# Patient Record
Sex: Male | Born: 1965
Health system: Southern US, Community
[De-identification: ages and names within clinical notes are randomized; demographics above are authoritative.]

## PROBLEM LIST (undated history)

## (undated) DIAGNOSIS — F1290 Cannabis use, unspecified, uncomplicated: Secondary | ICD-10-CM

## (undated) DIAGNOSIS — G4730 Sleep apnea, unspecified: Secondary | ICD-10-CM

## (undated) DIAGNOSIS — I10 Essential (primary) hypertension: Secondary | ICD-10-CM

## (undated) DIAGNOSIS — E7800 Pure hypercholesterolemia, unspecified: Secondary | ICD-10-CM

## (undated) DIAGNOSIS — I82409 Acute embolism and thrombosis of unspecified deep veins of unspecified lower extremity: Secondary | ICD-10-CM

## (undated) DIAGNOSIS — M419 Scoliosis, unspecified: Secondary | ICD-10-CM

## (undated) DIAGNOSIS — I2699 Other pulmonary embolism without acute cor pulmonale: Secondary | ICD-10-CM

## (undated) DIAGNOSIS — M1990 Unspecified osteoarthritis, unspecified site: Secondary | ICD-10-CM

## (undated) HISTORY — DX: Essential (primary) hypertension: I10

## (undated) HISTORY — PX: HERNIA REPAIR: SHX51

## (undated) HISTORY — DX: Other pulmonary embolism without acute cor pulmonale: I26.99

## (undated) HISTORY — DX: Scoliosis, unspecified: M41.9

## (undated) HISTORY — PX: KNEE SURGERY: SHX244

## (undated) HISTORY — PX: TONSILLECTOMY: SUR1361

## (undated) HISTORY — DX: Cannabis use, unspecified, uncomplicated: F12.90

---

## 2010-09-17 ENCOUNTER — Emergency Department (HOSPITAL_COMMUNITY)
Admission: EM | Admit: 2010-09-17 | Discharge: 2010-09-17 | Disposition: A | Discharge status: Home / Self Care | Payer: Self-pay | Attending: Emergency Medicine | Admitting: Emergency Medicine

## 2010-09-17 ENCOUNTER — Emergency Department (HOSPITAL_COMMUNITY): Payer: Self-pay

## 2010-09-17 DIAGNOSIS — M25469 Effusion, unspecified knee: Secondary | ICD-10-CM | POA: Insufficient documentation

## 2010-09-17 DIAGNOSIS — Z9889 Other specified postprocedural states: Secondary | ICD-10-CM | POA: Insufficient documentation

## 2010-09-17 DIAGNOSIS — M25569 Pain in unspecified knee: Secondary | ICD-10-CM | POA: Insufficient documentation

## 2010-09-17 DIAGNOSIS — I10 Essential (primary) hypertension: Secondary | ICD-10-CM | POA: Insufficient documentation

## 2013-05-04 ENCOUNTER — Encounter: Payer: Self-pay | Admitting: Family Medicine

## 2013-05-04 ENCOUNTER — Ambulatory Visit (INDEPENDENT_AMBULATORY_CARE_PROVIDER_SITE_OTHER): Payer: Commercial Managed Care - PPO | Admitting: Family Medicine

## 2013-05-04 VITALS — BP 153/92 | HR 69 | Temp 98.3°F | Ht 75.0 in | Wt 267.8 lb

## 2013-05-04 DIAGNOSIS — M25569 Pain in unspecified knee: Secondary | ICD-10-CM

## 2013-05-04 DIAGNOSIS — M545 Low back pain, unspecified: Secondary | ICD-10-CM

## 2013-05-04 DIAGNOSIS — Z6833 Body mass index (BMI) 33.0-33.9, adult: Secondary | ICD-10-CM

## 2013-05-04 DIAGNOSIS — Z0000 Encounter for general adult medical examination without abnormal findings: Secondary | ICD-10-CM

## 2013-05-04 DIAGNOSIS — F1910 Other psychoactive substance abuse, uncomplicated: Secondary | ICD-10-CM

## 2013-05-04 DIAGNOSIS — I10 Essential (primary) hypertension: Secondary | ICD-10-CM

## 2013-05-04 DIAGNOSIS — M25562 Pain in left knee: Secondary | ICD-10-CM

## 2013-05-04 DIAGNOSIS — Z23 Encounter for immunization: Secondary | ICD-10-CM

## 2013-05-04 DIAGNOSIS — F1911 Other psychoactive substance abuse, in remission: Secondary | ICD-10-CM | POA: Insufficient documentation

## 2013-05-04 DIAGNOSIS — M25561 Pain in right knee: Secondary | ICD-10-CM

## 2013-05-04 DIAGNOSIS — Z7689 Persons encountering health services in other specified circumstances: Secondary | ICD-10-CM

## 2013-05-04 DIAGNOSIS — Z7189 Other specified counseling: Secondary | ICD-10-CM

## 2013-05-04 LAB — LIPID PANEL
CHOL/HDL RATIO: 3.5 ratio
CHOLESTEROL: 162 mg/dL (ref 0–200)
HDL: 46 mg/dL (ref >39)
LDL Cholesterol: 88 mg/dL (ref 0–99)
Triglycerides: 142 mg/dL (ref <150)
VLDL: 28 mg/dL (ref 0–40)

## 2013-05-04 LAB — COMPREHENSIVE METABOLIC PANEL
ALBUMIN: 4.3 g/dL (ref 3.5–5.2)
ALT: 14 U/L (ref 0–53)
AST: 19 U/L (ref 0–37)
Alkaline Phosphatase: 65 U/L (ref 39–117)
BUN: 18 mg/dL (ref 6–23)
CALCIUM: 9.1 mg/dL (ref 8.4–10.5)
CHLORIDE: 107 meq/L (ref 96–112)
CO2: 26 meq/L (ref 19–32)
Creat: 1.26 mg/dL (ref 0.50–1.35)
GLUCOSE: 92 mg/dL (ref 70–99)
POTASSIUM: 4.7 meq/L (ref 3.5–5.3)
Sodium: 136 mEq/L (ref 135–145)
Total Bilirubin: 0.3 mg/dL (ref 0.2–1.2)
Total Protein: 6.9 g/dL (ref 6.0–8.3)

## 2013-05-04 LAB — TSH: TSH: 0.598 u[IU]/mL (ref 0.350–4.500)

## 2013-05-04 LAB — CBC
HCT: 45.1 % (ref 39.0–52.0)
HEMOGLOBIN: 15.1 g/dL (ref 13.0–17.0)
MCH: 29.7 pg (ref 26.0–34.0)
MCHC: 33.5 g/dL (ref 30.0–36.0)
MCV: 88.6 fL (ref 78.0–100.0)
PLATELETS: 197 10*3/uL (ref 150–400)
RBC: 5.09 MIL/uL (ref 4.22–5.81)
RDW: 15.4 % (ref 11.5–15.5)
WBC: 6.9 10*3/uL (ref 4.0–10.5)

## 2013-05-04 MED ORDER — AMLODIPINE BESYLATE 5 MG PO TABS: 5.0000 mg | ORAL_TABLET | Freq: Every day | ORAL | Status: DC

## 2013-05-04 NOTE — Assessment & Plan Note (Signed)
Long-standing, likely related to degenerative process +/- component of obesity and chronic kinetic chain disruption over many years with chronic knee pain / likely osteoarthritis. Continue OTC medications for now. Provided pt with phone number for sports medicine clinic. Will continue to counsel on diet / exercise to help with weight component, and will likely consider formal physical therapy. Will readdress at follow-up and / or will review [facility] notes and recommendations if / when pt is seen there.

## 2013-05-04 NOTE — Progress Notes (Signed)
Subjective:    [name], male    DOB: [birth year], 48 y.o.   MRN: [id number]  HPI: Pt presents to establish care. He also requests to discuss knee and back pain. He has not been seen in 4-5 years; his previous doctor was in [place].  Acute issues: Knee / back pain - long-standing, but acutely worse in the past six months with steady progression - both knees and low back have soreness and occasionally his knees swell - OTC medications such as Motrin and Tylenol help temporarily - knee braces and ACE wraps help some with stability - he has had bilateral knee surgeries (for ligamentous injuries) in the past; these were both done in [place] - he denies rash, fever / chills, change in bowel / bladder habits, and has no FH of rheumatoid arthritis  Chronic issues: HTN - diagnosed several years ago, ?2003 - was on amlodipine in the in the past, but has been able to afford it, he thinks 5 mg - denies LE swelling, headache, chest pain / palpitations, or change in vision  Past Medical History  Diagnosis Date  . Marijuana use   . Hypertension    Past Surgical History  Procedure Laterality Date  . Knee surgery Bilateral     Left knee 1993, right knee 2004  . Hernia repair      2005 and 2011   Family History  Problem Relation Age of Onset  . Alcohol abuse Mother   . Heart disease Mother   . Depression Mother   . Hypertension Mother   . Kidney disease Mother   . Arthritis Mother   . Arthritis Father   . Alcohol abuse Brother   . Drug abuse Brother   . Sickle cell anemia Brother   . Heart disease Other   . Arthritis Other   . Heart disease Other   . Arthritis Other    History   Social History  . Marital Status: Married    Spouse Name: N/A    Number of Children: N/A  . Years of Education: N/A   Occupational History  . Not on file.   Social History Main Topics  . Smoking status: Current Every Day Smoker  . Smokeless tobacco: Not on file  . Alcohol Use: Not  on file  . Drug Use: Not on file  . Sexual Activity: Not on file   Other Topics Concern  . Not on file   Social History Narrative  . No narrative on file   Pt is a current smoker, 1/4-1/5 pack per day; he is not interested in quitting just yet. He works as in Scientist, water qualitydoes a lot of walking." In addition to the above documentation, pt's PMH, surgical history, FH, and SH all reviewed and updated where appropriate in the EMR. I have also reviewed and updated the pt's allergies and current medications as appropriate.  Review of Systems: As above. Otherwise, full 12-system ROS was reviewed and all negative.     Objective:   Physical Exam BP 153/92  Pulse 69  Temp(Src) 98.3 F (36.8 C) (Oral)  Ht 6\' 3"  (1.905 m)  Wt 267 lb 12.8 oz (121.473 kg)  BMI 33.47 kg/m2 Gen: well-appearing adult male in NAD HEENT: [place]/AT, sclerae/conjunctivae clear, no lid lag, EOMI, PERRLA   MMM, posterior oropharynx clear, no cervical lymphadenopathy  neck supple with full ROM, no masses appreciated; thyroid not enlarged  Cardio: RRR, no murmur appreciated; distal pulses intact/symmetric Pulm: CTAB, no wheezes,  normal WOB  Abd: soft, nondistended, BS+, no HSM Ext: warm/well-perfused, no cyanosis/clubbing/edema MSK: strength 5/5 in all four extremities  Small bilateral knee effusions, R > L, but no frank warmth or redness  Tenderness to palpation along lateral joint lines bilaterally in knees  Increased pain in bilateral knees with varus stress  Bilateral knees with marked crepitus over patella with active extension  normal ROM to all four extremities  Point tenderness in lumbar spine around L4-S1  Mild muscle spasm bilaterally to paraspinal muscles in low thoracic / upper lumbar region Neuro/Psych: alert/oriented, sensation grossly intact; normal gait/balance  mood euthymic with congruent affect     Assessment & Plan:  See problem list notes.  MDM Justification for New Patient Level IV visit  (24462):  Problem points: 5 (established problem, worsening x2 -- bilateral knee pain, lumbar back pain, established problem, stable -- HTN)  Risk: Moderate (Rx drug management)

## 2013-05-04 NOTE — Assessment & Plan Note (Signed)
Likely degenerative in nature with history of bilateral knee surgeries for ligamentous injuries; pt is also obese and works on his feet (in Architect). Less concern for RA or gout given history / exam, though will need to consider these diagnosis pending further work-up and/or progression of illness. Continue OTC pain medications for now. Gave pt sports medicine clinic phone number, as well. Will continue to counsel on diet / exercise to help with weight component, and will likely consider formal physical therapy. Will readdress at follow-up and / or will review [facility] notes and recommendations if / when pt is seen there.

## 2013-05-04 NOTE — Assessment & Plan Note (Addendum)
Diagnosed first several years ago, but has been off of amlodipine for several years due to inability to pay for medication. Now has insurance and would like to restart this. BP today 153/92. Rx given for amlodipine 5 mg daily. Will check CMP, today. Will monitor at follow-up and adjust / add new agents as needed.

## 2013-05-04 NOTE — Patient Instructions (Signed)
Thank you for coming in, today!  We will check some basic lab work. I will call you or send you a letter with the results. I want you to start taking amlodipine again.  We will check your blood pressure regularly. If we need, we will adjust medicines for that.  For your back and knee pain, you can continue to take over-the-counter meds. You can call [facility] at [phone].  For your smoking, you can call a free phone number, 1-800-QUIT-NOW. They can help with counseling strategies and they will set up times to call you that's convenient for you. This is absolutely free. If they give you any ideas on things to try that you need my help with, let me know.  Please feel free to call with any questions or concerns at any time, at [phone]. --Dr. Venetia Maxon

## 2013-05-04 NOTE — Assessment & Plan Note (Signed)
Will check basic / baseline labs, today. See separate problem list notes. Pt will be due for colonoscopy after 48th birthday in July of this year. Flu shot given today.

## 2013-05-04 NOTE — Assessment & Plan Note (Signed)
Obese, though pt reportedly has lost almost 100 pounds (intentionally) in the last few years. Interested in therapeutic lifestyle changes. Briefly counseled on weight loss. Will check lipid panel and TSH today, then follow up as needed. Will consider referral to nutrition therapy based on pt preference.

## 2013-05-05 ENCOUNTER — Encounter: Payer: Self-pay | Admitting: Family Medicine

## 2013-05-05 ENCOUNTER — Telehealth: Payer: Self-pay | Admitting: Family Medicine

## 2013-05-05 DIAGNOSIS — Z9189 Other specified personal risk factors, not elsewhere classified: Secondary | ICD-10-CM | POA: Insufficient documentation

## 2013-05-05 NOTE — Telephone Encounter (Signed)
Pharmacy edit

## 2013-05-05 NOTE — Telephone Encounter (Signed)
Pt called to let the team know he is going to use [facility] as his main pharmacy. Also he would like all generics on his medications to include his BP medications. jw

## 2013-05-17 ENCOUNTER — Encounter: Payer: Self-pay | Admitting: Emergency Medicine

## 2013-05-17 ENCOUNTER — Ambulatory Visit
Admission: RE | Admit: 2013-05-17 | Discharge: 2013-05-17 | Disposition: A | Discharge status: Home / Self Care | Payer: Commercial Managed Care - PPO | Source: Ambulatory Visit | Attending: Sports Medicine | Admitting: Sports Medicine

## 2013-05-17 ENCOUNTER — Ambulatory Visit
Admission: RE | Admit: 2013-05-17 | Discharge: 2013-05-17 | Disposition: A | Discharge status: Home / Self Care | Payer: 59 | Source: Ambulatory Visit | Attending: Sports Medicine | Admitting: Sports Medicine

## 2013-05-17 ENCOUNTER — Ambulatory Visit (INDEPENDENT_AMBULATORY_CARE_PROVIDER_SITE_OTHER): Payer: Commercial Managed Care - PPO | Admitting: Emergency Medicine

## 2013-05-17 VITALS — BP 148/108 | Ht 75.0 in | Wt 267.0 lb

## 2013-05-17 DIAGNOSIS — M545 Low back pain, unspecified: Secondary | ICD-10-CM

## 2013-05-17 DIAGNOSIS — M25561 Pain in right knee: Secondary | ICD-10-CM

## 2013-05-17 DIAGNOSIS — M25562 Pain in left knee: Principal | ICD-10-CM

## 2013-05-17 DIAGNOSIS — M25569 Pain in unspecified knee: Secondary | ICD-10-CM

## 2013-05-17 MED ORDER — MELOXICAM 15 MG PO TABS: 15.0000 mg | ORAL_TABLET | Freq: Every day | ORAL | Status: DC

## 2013-05-17 NOTE — Progress Notes (Signed)
   Subjective:    [name], male    DOB: 05-10-1965, 48 y.o.   MRN: [id number]  HPI chief complaint: Bilateral knee and low back pain  Very pleasant 48 year old male comes in today with a couple of different complaints. He is complaining of bilateral knee pain which has been present now for several years. He is status post bilateral knee arthroscopy, first one done in 1997 and the second one done in 2004. He describes diffuse aching and intermittent swelling in both knees. He has a feeling of the knees wanting to give way. He gets frequent catching and popping particularly in the left knee. He has tried over-the-counter anti-inflammatories with minimal symptom relief. No recent trauma. He uses a compression wrap on the left knee which helps somewhat. In regards to his low back pain he describes a diffuse ache across the lower lumbar spine. Worse with activity. No radiculopathy.  Past medical history and current medications are reviewed Allergies are reviewed    Review of Systems     Objective:   Physical Exam Well-developed, well-nourished. No acute distress. Awake alert and oriented x3. Vital signs are reviewed.  Right knee: Full range of motion. No effusion. There is tenderness to palpation over the medial joint line with pain but no popping with McMurray's. No tenderness over the lateral joint line. Knee is grossly stable to ligamentous exam. No popliteal cyst. Neurovascularly intact distally.  Left knee: Full range of motion. Trace effusion. Tender to palpation over the medial joint line with pain but no popping with McMurray's. No tenderness over the lateral joint line. Knee is grossly stable to ligamentous exam. No popliteal cyst. Neurovascularly intact distally. Slight varus deformity.  Lumbar spine: Limited mobility secondary to pain. Diffuse tenderness to palpation along the lumbosacral area but nothing focal. No spasm. No gross focal neurological deficits of either  lower extremity.  X-rays of the left knee dated June of 2012 are reviewed. They are non-standing films. He has a mild degenerative changes with multiple intra-articular loose bodies. Nothing acute.       Assessment & Plan:  Chronic bilateral knee pain likely secondary to DJD with x-ray evidence of multiple intra-articular loose bodies in the left knee Chronic low back pain  I want to start with getting updated x-rays of his knees and an x-ray of his lumbar spine. Patient will likely need referral for arthroscopy to have the loose bodies removed from his left knee. We will try Mobic 15 mg daily as needed. He will use this in lieu of of over-the-counter naproxen sodium or Advil. I will call him after reviewing his x-rays at which point we will delineate further treatment.

## 2013-05-19 ENCOUNTER — Telehealth: Payer: Self-pay | Admitting: Sports Medicine

## 2013-05-19 ENCOUNTER — Telehealth: Payer: Self-pay | Admitting: *Deleted

## 2013-05-19 NOTE — Telephone Encounter (Signed)
I spoke with the patient on the phone today after reviewing x-rays of both knees and his lumbar spine. Left knee shows bone-on-bone medial compartmental DJD with multiple loose bodies. Right knee shows approaching bone-on-bone medial compartmental DJD with large calcific bodies within a Baker's cyst. Lumbar spine films show mild degenerative changes.  Patient has mechanical symptoms significant enough that I think it warrants consultation with Dr. Percell Miller to discussed possible arthroscopy to clean them out. However, he understands that definitive treatment down the road would be a total knee arthroplasty. He would like to discuss his surgical options with Dr. Percell Miller. Therefore I will arrange for consultation to take place sometime in the next couple of weeks. Further treatment in regards to his knees will be per the discretion of Dr. Percell Miller.

## 2013-05-19 NOTE — Telephone Encounter (Signed)
Per Dr. Micheline Chapman- scheduled pt for appt with Dr. Fredonia Highland on 05/24/13 at 9:30 am.  Pt notified of appt info.

## 2013-11-28 ENCOUNTER — Encounter (HOSPITAL_COMMUNITY): Payer: Self-pay | Admitting: Family Medicine

## 2013-11-28 ENCOUNTER — Observation Stay (HOSPITAL_COMMUNITY)
Admission: AD | Admit: 2013-11-28 | Discharge: 2013-11-29 | Disposition: A | Discharge status: Home / Self Care | Payer: 59 | Source: Ambulatory Visit | Attending: Family Medicine | Admitting: Family Medicine

## 2013-11-28 ENCOUNTER — Ambulatory Visit (INDEPENDENT_AMBULATORY_CARE_PROVIDER_SITE_OTHER): Payer: Commercial Managed Care - PPO | Admitting: Family Medicine

## 2013-11-28 ENCOUNTER — Ambulatory Visit (HOSPITAL_COMMUNITY)
Admission: RE | Admit: 2013-11-28 | Discharge: 2013-11-28 | Disposition: A | Discharge status: Home / Self Care | Payer: 59 | Source: Ambulatory Visit | Attending: Family Medicine | Admitting: Family Medicine

## 2013-11-28 VITALS — BP 173/105 | HR 69 | Temp 98.3°F | Wt 264.0 lb

## 2013-11-28 DIAGNOSIS — I82409 Acute embolism and thrombosis of unspecified deep veins of unspecified lower extremity: Principal | ICD-10-CM | POA: Diagnosis present

## 2013-11-28 DIAGNOSIS — M7989 Other specified soft tissue disorders: Secondary | ICD-10-CM | POA: Insufficient documentation

## 2013-11-28 DIAGNOSIS — I10 Essential (primary) hypertension: Secondary | ICD-10-CM | POA: Insufficient documentation

## 2013-11-28 DIAGNOSIS — F1911 Other psychoactive substance abuse, in remission: Secondary | ICD-10-CM

## 2013-11-28 DIAGNOSIS — I82401 Acute embolism and thrombosis of unspecified deep veins of right lower extremity: Secondary | ICD-10-CM

## 2013-11-28 DIAGNOSIS — M79609 Pain in unspecified limb: Secondary | ICD-10-CM

## 2013-11-28 DIAGNOSIS — F17200 Nicotine dependence, unspecified, uncomplicated: Secondary | ICD-10-CM | POA: Insufficient documentation

## 2013-11-28 DIAGNOSIS — Z6833 Body mass index (BMI) 33.0-33.9, adult: Secondary | ICD-10-CM | POA: Diagnosis not present

## 2013-11-28 DIAGNOSIS — Z87898 Personal history of other specified conditions: Secondary | ICD-10-CM

## 2013-11-28 HISTORY — DX: Acute embolism and thrombosis of unspecified deep veins of unspecified lower extremity: I82.409

## 2013-11-28 LAB — BASIC METABOLIC PANEL
Anion gap: 11 (ref 5–15)
BUN: 13 mg/dL (ref 6–23)
CALCIUM: 8.8 mg/dL (ref 8.4–10.5)
CO2: 26 meq/L (ref 19–32)
Chloride: 100 mEq/L (ref 96–112)
Creatinine, Ser: 0.97 mg/dL (ref 0.50–1.35)
GFR calc Af Amer: >90 mL/min (ref >90)
GLUCOSE: 91 mg/dL (ref 70–99)
Potassium: 4.3 mEq/L (ref 3.7–5.3)
Sodium: 137 mEq/L (ref 137–147)

## 2013-11-28 LAB — CBC
HEMATOCRIT: 42 % (ref 39.0–52.0)
Hemoglobin: 14.1 g/dL (ref 13.0–17.0)
MCH: 29.3 pg (ref 26.0–34.0)
MCHC: 33.6 g/dL (ref 30.0–36.0)
MCV: 87.1 fL (ref 78.0–100.0)
PLATELETS: 132 10*3/uL — AB (ref 150–400)
RBC: 4.82 MIL/uL (ref 4.22–5.81)
RDW: 13.6 % (ref 11.5–15.5)
WBC: 7.4 10*3/uL (ref 4.0–10.5)

## 2013-11-28 MED ORDER — AMLODIPINE BESYLATE 5 MG PO TABS
5.0000 mg | ORAL_TABLET | Freq: Every day | ORAL | Status: DC
  Administered 2013-11-28 – 2013-11-29 (×2): 5 mg via ORAL
  Filled 2013-11-28 (×2): qty 1

## 2013-11-28 MED ORDER — ACETAMINOPHEN 650 MG RE SUPP: 650.0000 mg | Freq: Four times a day (QID) | RECTAL | Status: DC | PRN

## 2013-11-28 MED ORDER — TRAMADOL HCL 50 MG PO TABS
50.0000 mg | ORAL_TABLET | Freq: Four times a day (QID) | ORAL | Status: DC
  Administered 2013-11-28 – 2013-11-29 (×5): 50 mg via ORAL
  Filled 2013-11-28 (×5): qty 1

## 2013-11-28 MED ORDER — HYDROCODONE-ACETAMINOPHEN 5-325 MG PO TABS: 1.0000 | ORAL_TABLET | ORAL | Status: DC | PRN

## 2013-11-28 MED ORDER — RIVAROXABAN 15 MG PO TABS
15.0000 mg | ORAL_TABLET | Freq: Two times a day (BID) | ORAL | Status: DC
  Administered 2013-11-28 – 2013-11-29 (×3): 15 mg via ORAL
  Filled 2013-11-28 (×4): qty 1

## 2013-11-28 MED ORDER — SENNOSIDES-DOCUSATE SODIUM 8.6-50 MG PO TABS: 1.0000 | ORAL_TABLET | Freq: Every evening | ORAL | Status: DC | PRN

## 2013-11-28 MED ORDER — ACETAMINOPHEN 325 MG PO TABS: 650.0000 mg | ORAL_TABLET | Freq: Four times a day (QID) | ORAL | Status: DC | PRN

## 2013-11-28 NOTE — H&P (Signed)
[facility] Admission History and Physical Service Pager: [phone]  [name] Medical record number: [id number] Date of birth: 04-Aug-1965 Age: 48 y.o. Gender: male  Primary Care Provider: Christa See, MD Consultants: none Code Status: Full   Chief Complaint: Right swollen leg, DVT   Assessment and Plan: Jeffrey [name] is a 48 y.o. male presenting with RLE DVT. PMH is significant for HTN.   #DVT: Right leg DVT found with LE doppler.  Appears to be unprovoked. Patient with no known travel history, no injury, no changes in bowel movement but denies colonoscopy. Has 18.5 pack year history but no SOB or hemoptysis. Intentional weight loss of 50 lbs over 1.5 years.  Has a history of cocaine use but denies any recent use.  No recent surgery.  - admitted to [facility], Dr. Ree Kida attending  - Xarelto 15 mg BID for anticoagulation (discussed with pharmacy)  - CBC - BMP  - tylenol for mild pain; tramadol for moderate to severe pain  - UDS: pending   #HTN: Uncontrolled. Reports not taking his medication today. May be elevated due to being in pain.  - continue amlodipine. If continued elevation consider titration up.   #Hx of substance abuse: reports no use of cocaine since 2004.  - UDS pending   FEN/GI: Heart healthy diet/saline diet.  Prophylaxis: on full anticoagulation   Disposition: admitted to med-surg pending anticoagulation.  History of Present Illness: Jeffrey [name] is a 48 y.o. male presenting with right swollen leg. This started 3-4 weeks ago when he noticed that his ankle and calf were becoming more swollen and painful. He thought it was related to his prior knee pain and took some mobic. His swelling and pain increased until last Friday it was at its worse. His swelling spread proximally to his right thigh and it had become more painful. Activity was making it worse and rest or elevation help alleviate his symptoms.   He was  seen in clinic today and sent for lower extremity venous duplex with a positive test. He was directly admitted from clinic.   He denies any prior clot. No history of travel. He has a 18.5 pack year history. He has never had a colonoscopy but denies any changes on bowel movements. He denies any injury to his right leg. He denies any chest pain, shortness of breath, hemoptysis, fever, nausea, or chills. He does endorse night sweats. He thinks his mother and maternal grandmother have had clots before.  He's lost 50 lbs in 1.5 years but this has been intentional with diet and exercise.   Review Of Systems: Per HPI with the following additions:  Otherwise 12 point review of systems was performed and was unremarkable.  Patient Active Problem List   Diagnosis Date Noted  . Candidate for statin therapy due to risk of future cardiovascular event 05/05/2013  . HTN (hypertension) 05/04/2013  . Bilateral knee pain 05/04/2013  . Lumbar back pain 05/04/2013  . Healthcare maintenance 05/04/2013  . BMI 33.0-33.9,adult 05/04/2013  . Hx of substance abuse 05/04/2013   Past Medical History: Past Medical History  Diagnosis Date  . Marijuana use   . Hypertension   . DVT (deep venous thrombosis) 11/28/2013    RT LEG   Past Surgical History: Past Surgical History  Procedure Laterality Date  . Knee surgery Bilateral     Left knee 1993, right knee 2004  . Hernia repair      2005 and 2011   Social History: History  Substance Use Topics  . Smoking status: Current Every Day Smoker -- 0.50 packs/day    Types: Cigarettes  . Smokeless tobacco: Never Used  . Alcohol Use: Yes     Comment: 1-2 drinks per event, few times per week   Additional social history: Previous cocaine use but not endorsing any use.  Please also refer to relevant sections of EMR.  Family History: Family History  Problem Relation Age of Onset  . Alcohol abuse Mother   . Heart disease Mother   . Depression Mother   . Hypertension  Mother   . Kidney disease Mother   . Arthritis Mother   . Arthritis Father   . Alcohol abuse Brother   . Drug abuse Brother   . Sickle cell anemia Brother   . Heart disease Other   . Arthritis Other   . Heart disease Other   . Arthritis Other    Allergies and Medications: No Known Allergies No current facility-administered medications on file prior to encounter.   Current Outpatient Prescriptions on File Prior to Encounter  Medication Sig Dispense Refill  . amLODipine (NORVASC) 5 MG tablet Take 1 tablet (5 mg total) by mouth daily.  90 tablet  1  . meloxicam (MOBIC) 15 MG tablet Take 1 tablet (15 mg total) by mouth daily.  40 tablet  1    Objective: BP 161/103  Pulse 76  Temp(Src) 98.6 F (37 C) (Oral)  Resp 20  SpO2 98% Exam: General: NAD, well appearing, alert  HEENT: EOMI, [place]/AT, normal conjunctiva  Cardiovascular: S1S2, no mrg, RRR  Respiratory: CTAB, slight exp wheeze, no extra effort of breathing  Abdomen: soft, NTND, NO HSM, no rebound or guarding  Extremities: right left is significantly larger than left, TTP to proximal thigh, right leg is warm and pulses are +1, limited flexion due to pain, full extension, FROM of ankle, left leg: +2 pulses  Skin: no overlying erythema Neuro: no gross deficits.   Labs and Imaging: CBC BMET  No results found for this basename: WBC, HGB, HCT, PLT,  in the last 168 hours No results found for this basename: NA, K, CL, CO2, BUN, CREATININE, GLUCOSE, CALCIUM,  in the last 168 hours   Bilateral lower extremity venous duplex completed.  Preliminary report: Right - Positive for an extensive occlusive deep vein thrombosis of the posterior tibial, popliteal, profunda, femoral, and common femoral vein. There is also an extensive superficial thrombus of the greater saphenous vein from the mid calf to the confluence with the common femoral vein. The left leg was evaluated per protocol from a patient with DVT of the requested extremity. There  Is no evidence of deep vein thrombosis, superficial throbosis   Rosemarie Ax, MD 11/28/2013, 3:26 PM PGY-2, [facility] pager: [phone], text pages welcome

## 2013-11-28 NOTE — H&P (Signed)
FMTS Attending Note  I personally saw and evaluated the patient. The plan of care was discussed with the resident team. I agree with the assessment and plan as documented by the resident.   48 year old male with past medical history of hypertension and substance abuse admitted with right lower extremity DVT, patient has been having right lower extremity pain and swelling over the past 4-5 days, no associated shortness of breath pain, seen in office today and sent for lower extremity Dopplers, found to have right lower extremity DVT, no recent surgery, no recent travel, no personal history of blood clot, patient's mother did have a blood clot however patient is unsure of the cause, please refer to resident note for additional history of present illness  Vitals: Reviewed General: Pleasant African American male, no acute distress HEENT: Normocephalic, pupils equal in size bilaterally, extraocular movements are intact, moist mucous members Cardiac: Regular rate and rhythm, S1 and S2 present, no murmurs, no heaves or thrills Respiratory: Good auscultation bilaterally, normal effort General: Soft, nontender, bowel sounds present Extremities: Tender and edematous right lower extremity to mid thigh Vascular: 2+ dorsalis pedis pulses bilaterally  Assessment and plan: 48 year old male admitted with unprovoked DVT of the right lower extremity 1. Right lower extremity DVT - started on Xarelto, place and observation overnight 2. Hypertension-continue home medications 3. History of substance abuse-agree with UDS  Dossie Arbour M.D.

## 2013-11-28 NOTE — Assessment & Plan Note (Addendum)
High suspicion of DVT. Ordered venous duplex. Call from vascular with results of multiple DVTs in right leg. Called for direct admission of patient for anticoagulation

## 2013-11-28 NOTE — Progress Notes (Signed)
Family Medicine PGY-3 PCP Note  Pt seen at bedside. He states he is feeling okay with pain medicine for the pain with swelling in his leg. He expressed that he has seen an orthopedist and is considering knee replacement surgery; I will need to follow up with this as an outpatient (not sure if he will need any specific clearance / testing / etc, or a re-referral, or anything like that). Regardless, he seems to be doing reasonably well. I have not seen him in the clinic, recently.  I appreciate the excellent care provided by the inpatient FPTS and all consultants as well as all nursing and other ancillary staff on behalf of my patient. Please do not hesitate to contact me if I can be of any help.  Emmaline Kluver, MD PGY-3, [facility] 11/28/2013, 6:00 PM First call: [place] pager: [phone] (text pages welcome through [facility]) Personal pager: [phone]

## 2013-11-28 NOTE — Progress Notes (Signed)
    Subjective   Jeffrey [name] is a 48 y.o. male that presents for a same day visit  1. Right leg swelling: Symptoms started two weeks ago with some pain in his right knee, which eventually subsided. On Thursday, e developed pain on upper leg which worsened on Friday with significant swelling from ankle up to thigh. His leg was warm and erythematous at the time. He used ice and Mobic which did not help with the pain. He wanted to go to the ED but did not have the copay. He has had difficulty walking because of the pain. No recent surgeries. No history of blood clots. No chest pain or shortness of breath.  History  Substance Use Topics  . Smoking status: Current Every Day Smoker  . Smokeless tobacco: Not on file  . Alcohol Use: Yes     Comment: 1-2 drinks per event, few times per week    ROS Per HPI  Objective   BP 173/105  Pulse 69  Temp(Src) 98.3 F (36.8 C) (Oral)  Wt 264 lb (119.75 kg)  SpO2 98%  General: Well appearing gentleman appearing in mild distress Extremities: Right leg significant swollen compared to left. Calf measurement on right at 48cm compare to left at 41.5cm. Swelling present from ankle up to groin with significant tenderness at upper medial thigh of right leg. Dorsalis pedis pulses intact bilaterally. Holman's sign positive on right.  Assessment and Plan   Please refer to problem based charting of assessment and plan

## 2013-11-28 NOTE — Patient Instructions (Addendum)
Thank you for coming to see me today. It was a pleasure. Today we talked about:   Right leg swelling: It looks like you may have a blood clot. I am going to have you get an ultrasound of your leg's veins to see if there is a clot. If there is, we will call you for further instructions, which would include admittance to the hospital. Please be available today for use to reach you.  If you develop shortness of breath or chest pain, please go to the emergency department.  If you have any questions or concerns, please do not hesitate to call the office at [phone].  Sincerely,  Cordelia Poche, MD

## 2013-11-28 NOTE — Progress Notes (Signed)
  Admission note:  Arrival Method: wheelchair from clinic Mental Orientation: A &O x 4 Telemetry: none ordered Assessment: See Doc Flowsheets Skin: assessed and intact Tubes: n/a Fall Prevention Safety Plan: Educated patient on fall prevention safety plan, patient verbalized understanding and signed Admission Screening: completed by admission nurse [id number] Orientation: Patient has been oriented to the unit, staff and to the room.

## 2013-11-28 NOTE — Progress Notes (Signed)
VASCULAR LAB PRELIMINARY  PRELIMINARY  PRELIMINARY  PRELIMINARY  Bilateral lower extremity venous duplex completed.    Preliminary report:  Right - Positive for an extensive occlusive deep vein thrombosis of the posterior tibial, popliteal, profunda, femoral, and common femoral vein. There is also an extensive superficial thrombus of the greater saphenous vein from the mid calf to the confluence with the common femoral vein. There was no evidence of a Baker's cyst.  The left leg was evaluated per protocol from a patient with DVT of the requested extremity. There  Is no evidence of deep vein thrombosis, superficial thrombosis, or a Baker's cyst  Jeffrey [name], RVS 11/28/2013, 3:28 PM

## 2013-11-29 DIAGNOSIS — I82409 Acute embolism and thrombosis of unspecified deep veins of unspecified lower extremity: Secondary | ICD-10-CM | POA: Diagnosis not present

## 2013-11-29 MED ORDER — RIVAROXABAN 15 MG PO TABS: 15.0000 mg | ORAL_TABLET | Freq: Two times a day (BID) | ORAL | Status: DC

## 2013-11-29 MED ORDER — TRAMADOL HCL 50 MG PO TABS: 50.0000 mg | ORAL_TABLET | Freq: Four times a day (QID) | ORAL | Status: DC | PRN

## 2013-11-29 MED ORDER — RIVAROXABAN 20 MG PO TABS: 20.0000 mg | ORAL_TABLET | Freq: Every day | ORAL | Status: DC

## 2013-11-29 NOTE — Progress Notes (Signed)
UR Completed.  Kaamil More[place] Jane [phone] 11/29/2013  

## 2013-11-29 NOTE — Evaluation (Signed)
Physical Therapy Evaluation Patient Details Name: Jeffrey [name] MRN: [id number] DOB: 09-17-1965 Today's Date: 11/29/2013   History of Present Illness  Jeffrey [name] is a 48 y.o. male presenting with right swollen leg. This started 3-4 weeks ago when he noticed that his ankle and calf were becoming more swollen and painful. He thought it was related to his prior knee pain and took some mobic. His swelling and pain increased until last Friday it was at its worse. His swelling spread proximally to his right thigh and it had become more painful. Activity was making it worse and rest or elevation help alleviate his symptoms.   Clinical Impression  Pt presents with painful though independent mobility. Educated pt in safe use of crutches as well as need for frequent, short bouts of mobility. ROM exercises given to pt with handout for home. No further acute PT needs at this time, no f/u needed.     Follow Up Recommendations No PT follow up    Equipment Recommendations  None recommended by PT    Recommendations for Other Services       Precautions / Restrictions Precautions Precautions: None Restrictions Weight Bearing Restrictions: No      Mobility  Bed Mobility Overal bed mobility: Independent                Transfers Overall transfer level: Independent Equipment used: Crutches                Ambulation/Gait Ambulation/Gait assistance: Modified independent (Device/Increase time) Ambulation Distance (Feet): 150 Feet Assistive device: Crutches Gait Pattern/deviations: Step-to pattern Gait velocity: decreased   General Gait Details: at first pt ambulating with no wt on RLE, hopping on left. Encouraged him to put lt wt on RLE which he as able to do without significant increase in pain. Velocity decreased due to pain but pt safe and steady with ambulation. Educated him in frequent, short bouts of mobility every 1/2 hr  Stairs            Wheelchair Mobility     Modified Rankin (Stroke Patients Only)       Balance Overall balance assessment: Modified Independent                                           Pertinent Vitals/Pain Pain Assessment: 0-10 Pain Score: 7  Pain Location: right hip Pain Intervention(s): Monitored during session;Limited activity within patient's tolerance    Home Living Family/patient expects to be discharged to:: Private residence Living Arrangements: Alone Available Help at Discharge: Friend(s);Available PRN/intermittently Type of Home: House Home Access: Level entry     Home Layout: One level Home Equipment: Crutches Additional Comments: pt reports that he his very active with yard work, lifting, helping out other people. Reports that he has good support, friends that can get groceries for him, help him with cooking, etc    Prior Function Level of Independence: Independent               Hand Dominance   Dominant Hand: Right    Extremity/Trunk Assessment   Upper Extremity Assessment: Overall WFL for tasks assessed           Lower Extremity Assessment: RLE deficits/detail;LLE deficits/detail RLE Deficits / Details: pain with active knee extension and hip flexion, strength knee ext 4-/5, knee flex 4/5, hip flex 3+/5 LLE Deficits / Details: WFL  Cervical /  Trunk Assessment: Normal  Communication   Communication: No difficulties  Cognition Arousal/Alertness: Awake/alert Behavior During Therapy: WFL for tasks assessed/performed Overall Cognitive Status: Within Functional Limits for tasks assessed                      General Comments General comments (skin integrity, edema, etc.): significant swelling RLE compared to left, handout of HEP given to pt    Exercises Total Joint Exercises Standing Hip Extension: AROM;Right;10 reps;Standing General Exercises - Lower Extremity Ankle Circles/Pumps: AROM;Both;10 reps;Supine Quad Sets: AROM;Both;10 reps;Seated Heel  Slides: AROM;Right;10 reps;Supine      Assessment/Plan    PT Assessment Patent does not need any further PT services  PT Diagnosis     PT Problem List    PT Treatment Interventions     PT Goals (Current goals can be found in the Care Plan section) Acute Rehab PT Goals Patient Stated Goal: decreased pain and swelling RLE PT Goal Formulation: No goals set, d/c therapy    Frequency     Barriers to discharge        Co-evaluation               End of Session   Activity Tolerance: Patient limited by pain;Patient tolerated treatment well Patient left: in bed;with call bell/phone within reach Nurse Communication: Mobility status    Functional Assessment Tool Used: clinical judgement Functional Limitation: Mobility: Walking and moving around Mobility: Walking and Moving Around Current Status (O1308): At least 1 percent but less than 20 percent impaired, limited or restricted Mobility: Walking and Moving Around Goal Status [phone]): At least 1 percent but less than 20 percent impaired, limited or restricted Mobility: Walking and Moving Around Discharge Status [phone]): At least 1 percent but less than 20 percent impaired, limited or restricted    Time: 0921-0941 PT Time Calculation (min): 20 min   Charges:   PT Evaluation $Initial PT Evaluation Tier I: 1 Procedure PT Treatments $Gait Training: 8-22 mins   PT G Codes:   Functional Assessment Tool Used: clinical judgement Functional Limitation: Mobility: Walking and moving around   [facility], [place]  Leighton Roach 11/29/2013, 10:31 AM

## 2013-11-29 NOTE — Progress Notes (Signed)
FMTS Attending Note  I personally saw and evaluated the patient. The plan of care was discussed with the resident team. I agree with the assessment and plan as documented by the resident.   Pain slightly improved today, able to ambulate in hallway with PT, no dyspnea or chest pain. Stable for discharge on Xaralto for treatment of unprovoked DVT.   Dossie Arbour MD

## 2013-11-29 NOTE — Discharge Summary (Signed)
[facility] Discharge Summary  [name] Medical record number: [id number] Date of birth: 07/06/1965 Age: 48 y.o. Gender: male Date of Admission: 11/28/2013  Date of Discharge: 11/29/2013  Admitting Physician: Lupita Dawn, MD  Primary Care Provider: Christa See, MD Consultants: None  Indication for Hospitalization: DVT  Discharge Diagnoses/Problem List:  DVT, HTN  Disposition: Home  Discharge Condition: Improved  Discharge Exam:   Filed Vitals:   11/29/13 1001  BP: 133/87  Pulse: 69  Temp: 98.3 F (36.8 C)  Resp: 17   General: NAD, well appearing, alert  Cardiovascular: RRR no murmurs  Respiratory: NWOB, CTAB  Abdomen: soft, NTND, +BS Extremities: RLE  With 1+ pitting edema and TTP to proximal thigh, right leg is warm and pulses are +1, limited flexion due to pain, full extension, FROM of ankle, left leg: +2 pulses  Neuro: Grossly intact, no focal deficits Skin: Warm, dry   Brief Hospital Course:   #DVT This 48 year old man who presented as a direct admit from clinic with unprovoked RLE DVT as found by LE dopplers. Patient was started on xarelto15mg  BID. Patient will switch to 20mg  daily after 21 days of anticoagulation.  No prior history of DVT. Patient is a smoker, but has no history of prolonged immobility or other obvious risk factors. He exhibited no chest pain, dyspnea or any other signs concerning for PE. Patient was given tramadol and tylenol for pain.   #HTN Initially elevated to 170s/110s likely secondary to not taking medications and pain. Returned to within normal limits after restarting patient's home amlodipine.   Issues for Follow Up:  1) Consider treatment with at least 6 months of anticoagulation due to absence of provoking factors 2) Consider hypercoagulability panel or screening for malignancies  Significant Procedures: None  Significant Labs and Imaging:   Recent Labs Lab 11/28/13 1605   WBC 7.4  HGB 14.1  HCT 42.0  PLT 132*    Recent Labs Lab 11/28/13 1605  NA 137  K 4.3  CL 100  CO2 26  GLUCOSE 91  BUN 13  CREATININE 0.97  CALCIUM 8.8    Results/Tests Pending at Time of Discharge: None  Discharge Medications:    Medication List         amLODipine 5 MG tablet  Commonly known as:  NORVASC  Take 1 tablet (5 mg total) by mouth daily.     BAYER BACK & BODY PAIN EX ST PO  Take 2 tablets by mouth 3 (three) times daily as needed (pain).     ICY HOT EX  Apply 1 application topically every 4 (four) hours as needed (pain/swelling).     meloxicam 15 MG tablet  Commonly known as:  MOBIC  Take 1 tablet (15 mg total) by mouth daily.     Rivaroxaban 15 MG Tabs tablet  Commonly known as:  XARELTO  Take 1 tablet (15 mg total) by mouth 2 (two) times daily with a meal. Take 1 tablet 2 times daily with meals until 9/22     rivaroxaban 20 MG Tabs tablet  Commonly known as:  XARELTO  Take 1 tablet (20 mg total) by mouth daily with supper. START taking on 9/23  Start taking on:  12/20/2013     traMADol 50 MG tablet  Commonly known as:  ULTRAM  Take 1 tablet (50 mg total) by mouth every 6 (six) hours as needed.        Discharge Instructions: Please refer to  Patient Instructions section of EMR for full details.  Patient was counseled important signs and symptoms that should prompt return to medical care, changes in medications, dietary instructions, activity restrictions, and follow up appointments.   Follow-Up Appointments: Follow-up Information   Follow up with Nobie Putnam, DO On 12/05/2013. (3:00PM)    Specialty:  Osteopathic Medicine   Contact information:   [place] [address] [phone]       Dimas Chyle, MD 11/29/2013, 4:10 PM PGY-1 [place]

## 2013-11-29 NOTE — Discharge Instructions (Addendum)
You were admitted with a blood clot in your right leg, called a deep vein thrombosis, or DVT. It is unclear exactly what caused this to happen, however they are generally provoked by long periods of sitting, such as on long plane trips or long car rides. Smoking also contributes to blood clot formation. You can talk with your PCP about further evaluation for why this happened. We started you on a medication called Xarelto which is a blood thinner to help the clot from growing any larger. You will need to follow up with your PCP about how long you should be on this medication. The clot should slowly dissolve over time. You can continue to take tramadol and tylenol for the pain.  Deep Vein Thrombosis A deep vein thrombosis (DVT) is a blood clot that develops in the deep, larger veins of the leg, arm, or pelvis. These are more dangerous than clots that might form in veins near the surface of the body. A DVT can lead to serious and even life-threatening complications if the clot breaks off and travels in the bloodstream to the lungs.  A DVT can damage the valves in your leg veins so that instead of flowing upward, the blood pools in the lower leg. This is called post-thrombotic syndrome, and it can result in pain, swelling, discoloration, and sores on the leg. CAUSES Usually, several things contribute to the formation of blood clots. Contributing factors include:  The flow of blood slows down.  The inside of the vein is damaged in some way.  You have a condition that makes blood clot more easily. RISK FACTORS Some people are more likely than others to develop blood clots. Risk factors include:   Smoking.  Being overweight (obese).  Sitting or lying still for a long time. This includes long-distance travel, paralysis, or recovery from an illness or surgery. Other factors that increase risk are:   Older age, especially over 60 years of age.  Having a family history of blood clots or if you have  already had a blot clot.  Having major or lengthy surgery. This is especially true for surgery on the hip, knee, or belly (abdomen). Hip surgery is particularly high risk.  Having a long, thin tube (catheter) placed inside a vein during a medical procedure.  Breaking a hip or leg.  Having cancer or cancer treatment.  Pregnancy and childbirth.  Hormone changes make the blood clot more easily during pregnancy.  The fetus puts pressure on the veins of the pelvis.  There is a risk of injury to veins during delivery or a caesarean delivery. The risk is highest just after childbirth.  Medicines containing the male hormone estrogen. This includes birth control pills and hormone replacement therapy.  Other circulation or heart problems.  SIGNS AND SYMPTOMS When a clot forms, it can either partially or totally block the blood flow in that vein. Symptoms of a DVT can include:  Swelling of the leg or arm, especially if one side is much worse.  Warmth and redness of the leg or arm, especially if one side is much worse.  Pain in an arm or leg. If the clot is in the leg, symptoms may be more noticeable or worse when standing or walking. The symptoms of a DVT that has traveled to the lungs (pulmonary embolism, PE) usually start suddenly and include:  Shortness of breath.  Coughing.  Coughing up blood or blood-tinged mucus.  Chest pain. The chest pain is often worse with deep  breaths.  Rapid heartbeat. Anyone with these symptoms should get emergency medical treatment right away. Do not wait to see if the symptoms will go away. Call your local emergency services (911 in the [place] if you have these symptoms. Do not drive yourself to the hospital. DIAGNOSIS If a DVT is suspected, your health care provider will take a full medical history and perform a physical exam. Tests that also may be required include:  Blood tests, including studies of the clotting properties of the  blood.  Ultrasound to see if you have clots in your legs or lungs.  X-rays to show the flow of blood when dye is injected into the veins (venogram).  Studies of your lungs if you have any chest symptoms. PREVENTION  Exercise the legs regularly. Take a brisk 30-minute walk every day.  Maintain a weight that is appropriate for your height.  Avoid sitting or lying in bed for long periods of time without moving your legs.  Women, particularly those over the age of 21 years, should consider the risks and benefits of taking estrogen medicines, including birth control pills.  Do not smoke, especially if you take estrogen medicines.  Long-distance travel can increase your risk of DVT. You should exercise your legs by walking or pumping the muscles every hour.  Many of the risk factors above relate to situations that exist with hospitalization, either for illness, injury, or elective surgery. Prevention may include medical and nonmedical measures.  Your health care provider will assess you for the need for venous thromboembolism prevention when you are admitted to the hospital. If you are having surgery, your surgeon will assess you the day of or day after surgery. TREATMENT Once identified, a DVT can be treated. It can also be prevented in some circumstances. Once you have had a DVT, you may be at increased risk for a DVT in the future. The most common treatment for DVT is blood-thinning (anticoagulant) medicine, which reduces the blood's tendency to clot. Anticoagulants can stop new blood clots from forming and stop old clots from growing. They cannot dissolve existing clots. Your body does this by itself over time. Anticoagulants can be given by mouth, through an IV tube, or by injection. Your health care provider will determine the best program for you. Other medicines or treatments that may be used are:  Heparin or related medicines (low molecular weight heparin) are often the first treatment  for a blood clot. They act quickly. However, they cannot be taken orally and must be given either in shot form or by IV tube.  Heparin can cause a fall in a component of blood that stops bleeding and forms blood clots (platelets). You will be monitored with blood tests to be sure this does not occur.  Warfarin is an anticoagulant that can be swallowed. It takes a few days to start working, so usually heparin or related medicines are used in combination. Once warfarin is working, heparin is usually stopped.  Factor Xa inhibitor medicines, such as rivaroxaban and apixaban, also reduce blood clotting. These medicines are taken orally and can often be used without heparin or related medicines.  Less commonly, clot dissolving drugs (thrombolytics) are used to dissolve a DVT. They carry a high risk of bleeding, so they are used mainly in severe cases where your life or a part of your body is threatened.  Very rarely, a blood clot in the leg needs to be removed surgically.  If you are unable to take anticoagulants, your  health care provider may arrange for you to have a filter placed in a main vein in your abdomen. This filter prevents clots from traveling to your lungs. HOME CARE INSTRUCTIONS  Take all medicines as directed by your health care provider.  Learn as much as you can about DVT.  Wear a medical alert bracelet or carry a medical alert card.  Ask your health care provider how soon you can go back to normal activities. It is important to stay active to prevent blood clots. If you are on anticoagulant medicine, avoid contact sports.  It is very important to exercise. This is especially important while traveling, sitting, or standing for long periods of time. Exercise your legs by walking or by tightening and relaxing your leg muscles regularly. Take frequent walks.  You may need to wear compression stockings. These are tight elastic stockings that apply pressure to the lower legs. This  pressure can help keep the blood in the legs from clotting. Alternative Medicines to Warfarin: Factor Xa Inhibitor Medicines  These blood-thinning medicines are taken by mouth, usually for several weeks or longer. It is important to take the medicine every single day at the same time each day.  There are no regular blood tests required when using these medicines.  There are fewer food and drug interactions than with warfarin.  The side effects of this class of medicine are similar to those of warfarin, including excessive bruising or bleeding. Ask your health care provider or pharmacist about other potential side effects. SEEK MEDICAL CARE IF:  You notice a rapid heartbeat.  You feel weaker or more tired than usual.  You feel faint.  You notice increased bruising.  You feel your symptoms are not getting better in the time expected.  You believe you are having side effects of medicine. SEEK IMMEDIATE MEDICAL CARE IF:  You have chest pain.  You have trouble breathing.  You have new or increased swelling or pain in one leg.  You cough up blood.  You notice blood in vomit, in a bowel movement, or in urine. MAKE SURE YOU:  Understand these instructions.  Will watch your condition.  Will get help right away if you are not doing well or get worse. Document Released: 03/16/2005 Document Revised: 07/31/2013 Document Reviewed: 11/21/2012 [facility] Patient Information 2015 [place], [place]. This information is not intended to replace advice given to you by your health care provider. Make sure you discuss any questions you have with your health care provider.   Information on my medicine - XARELTO (rivaroxaban)  This medication education was reviewed with me or my healthcare representative as part of my discharge preparation.  The pharmacist that spoke with me during my hospital stay was:  Lawson Radar, [place]? Xarelto was prescribed to  treat blood clots that may have been found in the veins of your legs (deep vein thrombosis) or in your lungs (pulmonary embolism) and to reduce the risk of them occurring again.  What do you need to know about Xarelto? The starting dose is one 15 mg tablet taken TWICE daily with food for the FIRST 21 DAYS then on (enter date) Wednesday, 12/20/13  the dose is changed to one 20 mg tablet taken ONCE A DAY with your evening meal.  DO NOT stop taking Xarelto without talking to the health care provider who prescribed the medication.  Refill your prescription for 20 mg tablets before you run out.  After discharge, you  should have regular check-up appointments with your healthcare provider that is prescribing your Xarelto.  In the future your dose may need to be changed if your kidney function changes by a significant amount.  What do you do if you miss a dose? If you are taking Xarelto TWICE DAILY and you miss a dose, take it as soon as you remember. You may take two 15 mg tablets (total 30 mg) at the same time then resume your regularly scheduled 15 mg twice daily the next day.  If you are taking Xarelto ONCE DAILY and you miss a dose, take it as soon as you remember on the same day then continue your regularly scheduled once daily regimen the next day. Do not take two doses of Xarelto at the same time.   Important Safety Information Xarelto is a blood thinner medicine that can cause bleeding. You should call your healthcare provider right away if you experience any of the following:   Bleeding from an injury or your nose that does not stop.   Unusual colored urine (red or dark brown) or unusual colored stools (red or black).   Unusual bruising for unknown reasons.   A serious fall or if you hit your head (even if there is no bleeding).  Some medicines may interact with Xarelto and might increase your risk of bleeding while on Xarelto. To help avoid this, consult your healthcare provider or  pharmacist prior to using any new prescription or non-prescription medications, including herbals, vitamins, non-steroidal anti-inflammatory drugs (NSAIDs) and supplements.  This website has more information on Xarelto: [email or web address].[email or web address]

## 2013-11-29 NOTE — Progress Notes (Signed)
Patient Discharge:  Disposition: Pt discharged home with family  Education: Pt educated on all discharge instructions, medications and medication administrations, diet, diagnosis and follow up appointments. Answered all questions. Gave patient additional handouts. Pt verbalized understanding.   IV: Left forearm IV discontinued.  Follow-up appointments: Follow up appointment made for pt.   Prescriptions: Script sent to patient's pharmacy and hard copy script give to pt for Tramadol prescription.   Transportation: Pt transported home via car by wife.  Belongings: Cell phone, clothing, crutches, wallet and all other belongings taken with pt.

## 2013-11-29 NOTE — Progress Notes (Signed)
Family Medicine Teaching Service Daily Progress Note Intern Pager: [phone]  [name] Medical record number: [id number] Date of birth: [birth year] Age: 48 y.o. Gender: male  Primary Care Provider: Christa See, MD Consultants: None Code Status: Full  Pt Overview and Major Events to Date:  9/1 Admitted to floor with DVT  Assessment and Plan: Nakul Avino is a 48 y.o. male presenting with RLE DVT. PMH is significant for HTN.   #DVT: Right leg DVT found with LE doppler. Appears to be unprovoked. Patient with no known travel history, no injury, no changes in bowel movement but denies colonoscopy. Has 18.5 pack year history but no SOB or hemoptysis. Intentional weight loss of 50 lbs over 1.5 years. Has a history of cocaine use but denies any recent use. No recent surgery.  - Xarelto 15 mg BID for anticoagulation (discussed with pharmacy)  - tylenol for mild pain; tramadol for moderate to severe pain  - PT consult pending  #HTN: Uncontrolled. Mildly elevated during this admission -Continue amlodipine 5mg  qday -Follow up with PCP for continued management  #Hx of substance abuse: reports no use of cocaine since 2004.  - UDS pending   FEN/GI: Heart healthy diet/saline diet.  Prophylaxis: on full anticoagulation   Disposition: Admitted to floor. Possible discharge today pending PT recs.  Subjective:  NAE. Still has significant pain in RLE. Says that he is ready to go home, but worried about getting around home with current pain in RLE. No chest tightness or shortness of breath.  Objective: Temp:  [98.3 F (36.8 C)-98.6 F (37 C)] 98.4 F (36.9 C) (09/02 0524) Pulse Rate:  [65-85] 65 (09/02 0524) Resp:  [16-21] 16 (09/02 0524) BP: (146-173)/(86-114) 146/90 mmHg (09/02 0524) SpO2:  [96 %-99 %] 96 % (09/02 0524) Weight:  [264 lb (119.75 kg)] 264 lb (119.75 kg) (09/01 1048) Physical Exam: General: NAD, well appearing, alert  Cardiovascular: RRR no murmurs   Respiratory: NWOB, CTAB  Abdomen: soft, NTND,  Extremities: right left is significantly larger than left, TTP to proximal thigh, right leg is warm and pulses are +1, limited flexion due to pain, full extension, FROM of ankle, left leg: +2 pulses    Laboratory:  Recent Labs Lab 11/28/13 1605  WBC 7.4  HGB 14.1  HCT 42.0  PLT 132*    Recent Labs Lab 11/28/13 1605  NA 137  K 4.3  CL 100  CO2 26  BUN 13  CREATININE 0.97  CALCIUM 8.8  GLUCOSE 91    Dimas Chyle, MD 11/29/2013, 9:51 AM PGY-1, [place] Intern pager: [phone], text pages welcome

## 2013-11-30 NOTE — Discharge Summary (Signed)
I agree with the discharge summary as documented.   Lilleigh Hechavarria MD  

## 2013-12-05 ENCOUNTER — Ambulatory Visit (INDEPENDENT_AMBULATORY_CARE_PROVIDER_SITE_OTHER): Payer: 59 | Admitting: Family Medicine

## 2013-12-05 ENCOUNTER — Encounter: Payer: Self-pay | Admitting: Family Medicine

## 2013-12-05 VITALS — BP 136/84 | HR 70 | Temp 98.3°F | Ht 75.5 in | Wt 274.9 lb

## 2013-12-05 DIAGNOSIS — Z720 Tobacco use: Secondary | ICD-10-CM

## 2013-12-05 DIAGNOSIS — I82409 Acute embolism and thrombosis of unspecified deep veins of unspecified lower extremity: Secondary | ICD-10-CM

## 2013-12-05 DIAGNOSIS — I82401 Acute embolism and thrombosis of unspecified deep veins of right lower extremity: Secondary | ICD-10-CM

## 2013-12-05 DIAGNOSIS — F17200 Nicotine dependence, unspecified, uncomplicated: Secondary | ICD-10-CM

## 2013-12-05 MED ORDER — OXYCODONE-ACETAMINOPHEN 5-325 MG PO TABS: 1.0000 | ORAL_TABLET | Freq: Four times a day (QID) | ORAL | Status: DC | PRN

## 2013-12-05 NOTE — Assessment & Plan Note (Addendum)
Stable, known extensive DVT in RLE - Currently on anti-coagulation with Xarelto - Persistent edema and pain, no new symptoms, no respiratory symptoms - 1st episode DVT, seems to be unprovoked. History with reported +family hx DVT, possibility of hypercoagulable state, less likely malignancy, possible idiopathic  Plan: 1. Continue Xarelto x 6 months total, as previously prescribed for anti-coagulation, 15mg  BID until 12/19/13 then start 20mg  daily 2. Rx Percocet 5/325 take 1-2 q 6 hr PRN (#60, 0 refills) for increased pain control, d/t ineffective Tramadol, may take NSAIDs PRN 3. Advised elevation, icing to reduce swelling. Stay active with exercises, use crutches PRN 4. RTC 2-4 weeks for re-evaluation of edema and pain  Future Considerations: 1. Hypercoagulable lab testing - consider in 6 months after completion of Xarelto, re-discuss with patient d/t cost of tests 2. Colonoscopy (age 48 and appropriate CA screening for possible hypercoag) - given handout for scheduling colonoscopy, advised wait 6 mo until off Xarelto

## 2013-12-05 NOTE — Patient Instructions (Signed)
Dear Jeffrey [name], Thank you for coming in to clinic today.  Today we discussed your Right Leg DVT (Deep Vein Thrombosis - Blood Clot). 1. Since you had a fairly extensive blood clot in your Right leg, your current swelling and pain is to be expected. 2. Prescribed Percocet for the pain, take 1 to 2 tablets every 6 hours as needed, temporary prescription. May continue Tramadol also as needed 3. Stay active, use crutches for ambulation as needed, continue working on exercises, elevation above heart level, use ice as well for swelling 4. Continue Xarelto as prescribed, you will take this for total 6 months.  Recommend scheduling Colonoscopy - see handout and call to schedule (recommend waiting until after 6 months of treatment, should be off of blood thinner medications when you have this performed).  In the future, we will likely do further lab testing to determine cause of your blood clot.  Please schedule a follow-up appointment with Dr. Venetia Maxon in about 2 to 4 weeks for re-evaluation of swelling and pain.  If you develop worsening pain and swelling, shortness of breath, chest pain, difficulty breathing, coughing up blood, please call our clinic and go directly to Emergency Department for further evaluation.  If you have any other questions or concerns, please feel free to call the clinic to contact me. You may also schedule an earlier appointment if necessary.  However, if your symptoms get significantly worse, please go to the Emergency Department to seek immediate medical attention.  Nobie Putnam, DO [facility]   Venous Thromboembolism, Prevention A venous thromboembolism is a blood clot that forms in a vein. A blood clot in a deep vein is called a deep venous thrombosis (DVT). A blood clot in the lungs is called a pulmonary embolism (PE). Blood clots are dangerous and can cause death. Blood clots can form in the:  Lungs.  Legs.  Arms. CAUSES  A blood  clot can form in a vein from different conditions. A blood clot can develop due to:  Blood flow within a vein that is sluggish or very slow.  Medical conditions that make the blood clot easily.  Vein damage. RISK FACTORS Risk factors can increase your risk of developing a blood clot. Risk factors can include:  Smoking.  Obesity.  Age.  Immobility or sedentary lifestyle.  Sitting or standing for long periods of time.  Chronic or long-term bedrest.  Medical or past history of blood clots.  Family history of blood clots.  Hip, leg, or pelvis injury or trauma.  Major surgery, especially surgery on the hip, knee, or abdomen.  Pregnancy and childbirth.  Birth control pills and hormone replacement therapy.  Medical conditions such as  Peripheral vascular disease (PVD).  Diabetes.  Cancer. SYMPTOMS  Symptoms of VTE can depend on where the clot is located and if the clot breaks off and travels to another organ. Sometimes, there may be no symptoms.   DVT symptoms can include:  Swelling of the leg or arm, especially on one side.  Warmth and redness of the leg or arm, especially on one side.  Pain in an arm or leg. Leg pain may be more noticeable or worse when standing or walking.  PE symptoms can include:  Shortness of breath.  Coughing.  Coughing up blood or blood-tinged mucus (hemoptysis).  Chest pain or chest pain with deep breaths (pleuritic chest pain).  Apprehension, anxiety, or a feeling of impending doom.  Rapid heartbeat. PREVENTION  Exercise regularly. Take a  brisk 30 minute walk every day. Staying active and moving around can help prevent blood clots.  Avoid sitting or lying in bed for long periods of time. Change your position often, especially during a long trip.  Women, especially those over the age of 57, should consider the risks and benefits of taking estrogen medicines. This includes birth control pills and hormone replacement  therapy.  Do not smoke, especially if you take estrogen medicines. If you smoke, talk to your caregiver on how to quit.  Eat plenty of fruits and vegetables. Ask your caregiver or dietitian if there are foods you should avoid.  Maintain a weight as suggested by your caregiver.  Wear loose-fitting clothing. Avoid constrictive or tight clothing around your legs or waist.  Try not to bump or injure your legs. Avoid crossing your legs when you are sitting.  Do not use pillows under your knees unless told by your caregiver.  Take all medicines that your caregiver prescribes you.  Wear special stockings (compression stockings or TED hose) if your caregiver prescribes them.  Wearing compression stockings (support hose) can make the leg veins more narrow. This increases blood flow in the legs and can help prevent blood clots.  It is important to wear compression stockings correctly. Do not let them bunch up when you are wearing them. TRAVEL Long distance travel can increase the risk of a blood clot. To prevent a blood clot when traveling:  You should exercise your legs by walking or by pumping your muscles every hour. To help prevent poor circulation on long trips, stand, stretch, and walk up and down the aisle of your airplane, train, or bus as often as possible to get the blood moving.  Do squats if you are able. If you are unable to do squats, raise your foot on the balls of your feet and tighten your lower leg muscles (particularly the calve muscles) while seated. Pointing (flexing and extending) your toes while tightening your calves while seated are also good exercises to do every hour during long trips. They help increase blood flow and reduce risk of DVT.  Stay well hydrated. Drink water regularly when traveling, especially when you are sitting or immobile for long periods of time.  Use of drugs to prevent DVT during routine travel is not generally recommended. Before taking any drugs  to reduce risk of DVT, consult your caregiver. SURGERY AND HOSPITALIZATION  People who are at high risk for a blood clot may be given a blood thinning medicine (anticoagulant) when they are hospitalized even if they are not going to have surgery.  A long trip prior to surgery can increase the risk of a clot for patients undergoing hip and knee replacements. Talk to your caregiver about travel plans before your surgery.  After hip or knee surgery, your caregiver may give you anticoagulants to help prevent blood clots.  Anticoagulants may be given to people at high risk of developing thromboembolism, before, during, or sometimes after surgery, including people with clotting disorders or with a history of past thromboembolism. TRAVEL AFTER SURGERY  In orthopedic surgery, the cutting of bones prompts the body to increase clotting factors in the blood. Due to the size of the bones involved in hip and knee replacements, there is a higher risk of blood clotting than other orthopedic surgeries.  There is a risk of clotting for up to 4-6 weeks after surgery. Flying or traveling long distances can increase your risk of a clot. As a result, those  who travel long distances may need additional preventive measures after their procedure.  Drink only non-alcoholic beverages during your flight, train, or car travel. Alcohol can dehydrate you and increase your risk of getting blood clots. SEEK IMMEDIATE MEDICAL CARE IF:   You develop chest pain.  You develop severe shortness of breath.  You have breathing problems after traveling.  You develop swelling or pain in the leg.  You begin to cough up bloody mucus or phlegm (sputum).  You feel dizzy or faint. Document Released: 03/04/2009 Document Revised: 12/09/2011 Document Reviewed: 03/04/2009 [facility] Patient Information 2015 [place], [place]. This information is not intended to replace advice given to you by your health care provider. Make sure you discuss  any questions you have with your health care provider.

## 2013-12-05 NOTE — Assessment & Plan Note (Signed)
Active smoker, 0.5ppd, reduced from previous  Plan: 1. Counseled on smoking cessation, not ready to quit, will discuss in future

## 2013-12-05 NOTE — Progress Notes (Signed)
   Subjective:    [name], male    DOB: 03-Sep-1965, 48 y.o.   MRN: [id number]  [facility] follow-up appointment, hospitalized 11/28/13 to 11/29/13, found to have RLE DVT, started on Xarelto for anticoagulation.  DVT: - Initially presented with RLE pain and swelling for several weeks prior to [facility] on 11/28/13, without improvement. - Currently about 1 week after hospitalization, patient reports concern with persistent and possibly worsening swelling from Right ankle extending to calf, knee, and thigh. Without significant improvement - Admits pain is about the same since onset, "throbbing - burning pain" intermittent daily few times daily lasting 1-2 hours per episode, worse Right medial thigh, worse with walking and deep palpation, improved with elevation and straightening. Denies significant pain in calf or ankle. - Ambulating with crutches, if stand on feet for prolonged period with worse pain and edema - Taking Tramadol 50mg  BID without any relief. Did try 1 Percocet that was offered from a friend with notable improvement. - Doing some home exercises from PT - Admits to cough 3-4x days - Denies fevers/chills, CP, SOB, nausea, vomiting, palpitations, hemoptysis  Tobacco Abuse: - Active smoker, 0.5ppd, admits significantly reduced from previous >1ppd - Chronic history of smoking, interested in continuing to reduce and eventually quit, but not currently ready. Not interested in NRT or medicines at this time.  Family Hx: - Reported history of family members with blood clots, also significant with Mother with DVT  I have reviewed and updated the following as appropriate: allergies and current medications  Social Hx: - Currently smoking 0.5 ppd  Review of Systems  See above HPI    Objective:   Physical Exam  BP 136/84  Pulse 70  Temp(Src) 98.3 F (36.8 C) (Oral)  Ht 6' 3.5" (1.918 m)  Wt 274 lb 14.4 oz (124.694 kg)  BMI 33.90 kg/m2  Gen - well-appearing,  discomfort due to RLE swelling / pain, NAD Heart - RRR, no murmurs heard Lungs - CTAB, no wheezing, crackles, or rhonchi. Normal work of breathing. Ext - RLE: generalized non-pitting edema including ankle, calf, knee, thigh (medial > lateral), without erythema, mild to moderate tenderness to palpation medial thigh, calf non-tender to palpation. Left Leg without edema, non-tender. peripheral pulses intact +2 b/l Skin - warm, dry, no rashes Neuro - awake, alert, grossly non-focal, intact muscle strength 5/5 b/l RLE and LLE, intact distal sensation to light touch, ambulates with crutches due to limited ROM with RLE     Assessment & Plan:   See specific A&P problem list for details.

## 2013-12-19 ENCOUNTER — Telehealth: Payer: Self-pay | Admitting: Family Medicine

## 2013-12-19 DIAGNOSIS — I82401 Acute embolism and thrombosis of unspecified deep veins of right lower extremity: Secondary | ICD-10-CM

## 2013-12-19 MED ORDER — OXYCODONE-ACETAMINOPHEN 5-325 MG PO TABS: 1.0000 | ORAL_TABLET | Freq: Four times a day (QID) | ORAL | Status: DC | PRN

## 2013-12-19 NOTE — Telephone Encounter (Signed)
Left message with patient wife for him to call back.

## 2013-12-19 NOTE — Telephone Encounter (Signed)
Needs refill on oxycodene. " had a situation at his house yesterday" Would like to talk to Dr Rodman Key, [facility] or [facility] about this

## 2013-12-19 NOTE — Telephone Encounter (Signed)
Patient requesting to speak with PCP, states son got a hold of his pain pills, patient very vague. Would like MD to call him

## 2013-12-19 NOTE — Telephone Encounter (Signed)
Patient states he can be reached at [phone]

## 2013-12-19 NOTE — Telephone Encounter (Signed)
Called pt to discuss. He reports his son dumped his Percocet pills into the toilet and that he had about 45 tablets left in the bottle. Pt has an upcoming appointment with me in about two weeks. Will fill Rx for 45 tablets of 5-325 mg dose, 1-2 tablets q6, then readdress at follow-up. --CMS

## 2014-01-02 ENCOUNTER — Encounter: Payer: Self-pay | Admitting: Family Medicine

## 2014-01-02 ENCOUNTER — Ambulatory Visit (INDEPENDENT_AMBULATORY_CARE_PROVIDER_SITE_OTHER): Payer: 59 | Admitting: Family Medicine

## 2014-01-02 VITALS — BP 152/92 | HR 98 | Temp 98.4°F | Ht 75.0 in | Wt 275.0 lb

## 2014-01-02 DIAGNOSIS — I10 Essential (primary) hypertension: Secondary | ICD-10-CM

## 2014-01-02 DIAGNOSIS — I82401 Acute embolism and thrombosis of unspecified deep veins of right lower extremity: Secondary | ICD-10-CM

## 2014-01-02 DIAGNOSIS — Z720 Tobacco use: Secondary | ICD-10-CM

## 2014-01-02 MED ORDER — OXYCODONE-ACETAMINOPHEN 5-325 MG PO TABS: 1.0000 | ORAL_TABLET | Freq: Four times a day (QID) | ORAL | Status: DC | PRN

## 2014-01-02 MED ORDER — NICOTINE 14 MG/24HR TD PT24: 14.0000 mg | MEDICATED_PATCH | Freq: Every day | TRANSDERMAL | Status: DC

## 2014-01-02 NOTE — Progress Notes (Signed)
   Subjective:    [name], male    DOB: [birth year], 48 y.o.   MRN: [id number]  HPI: Pt presents to clinic for f/u of DVT in right leg. He states he is compliant with Xarelto and occasionally uses Percocet for pain; he does need refill on Percocet. He states the redness and swelling in his leg is better. He still has some ankle swelling, which usually gets worse through the day but better at night. He denies palpable cords or swelling above his knee. He has minimal pain, but does have "some days worse than others."   He has cut back his smoking to a half a pack or less per day. He feels like he can quit on his own, but would like to try patches in the future, if he can't quit completely on his own. He is also potentially interested in seeing Dr. Valentina Lucks for smoking cessation counseling.  Review of Systems: As above. He denies fever / chills, SOB, blood in his stool or sputum. Pt states he was "in a rush today," as he thought he was going to be late for his appt, but has taken his BP meds and is only in "mild pain," to both of which he attributes his blood pressure. He denies headache, change in vision, chest pain.    Objective:   Physical Exam BP 150/111  Pulse 98  Temp(Src) 98.4 F (36.9 C) (Oral)  Ht 6\' 3"  (1.905 m)  Wt 275 lb (124.739 kg)  BMI 34.37 kg/m2 Manual recheck: 152/92 Gen: well-appearing adult male in NAD HEENT: [place]/AT, EOMI, PERRLA; conjunctivae without pallor Cardio: RRR, no murmur appreciated Pulm: CTAB, no wheezes, normal WOB Abd: soft, nontender, BS+ Ext: right calf slightly swollen compared to left, with minimal redness / warmth  No palpable cords, bilaterally; no tenderness in either calf  No thigh swelling, redness, or tenderness bilaterally  Bilateral pitting edema of ankles noted, R (1+) > L (trace), with right higher onto shin    Assessment & Plan:  See problem list notes

## 2014-01-02 NOTE — Assessment & Plan Note (Signed)
A: Known extensive DVT of RLE, on Xarelto and compliant, with clinical and subjective improvement in symptoms and physical exam. Still with occasional pain, well-controlled with Percocet.   P: Continue Xarelto for 6 months total (started 9/1, so will be done in March 2016). Refilled Percocet 5-325 1-2 q6 hours, #90 with plan to wean slowly over the next several weeks. Continue elevation, rest, massage / exercises, etc, PRN. May consider hypercoagulable state work-up after being off Xarelto next year. Also reinforced need for colonoscopy, which pt states he will try to get done (may wait until off Xarelto for this, as well). F/u in about 2 months, or sooner if needed.

## 2014-01-02 NOTE — Assessment & Plan Note (Signed)
A: elevated today, now on amlodipine 5 mg daily. No signs / symptoms for frank urgency / emergency. Advised pt to continue Norvasc and to monitor BP a couple of times a week at pharmacy or [facility] or similar place, and to call or return to clinic sooner rather than later if it stays persistently elevated. Monitor at next f/u and consider going up on Norvasc to 10 mg, though pt already has some ankle edema with 5 mg.

## 2014-01-02 NOTE — Assessment & Plan Note (Signed)
Strongly advised complete cessation and praised cutting back. Rx written for nicotine patches to start if pt desires. Also provided him with the 1-800-QUIT-NOW number and advised f/u with pharmacy clinic if he so desires.

## 2014-01-02 NOTE — Patient Instructions (Addendum)
Thank you for coming in, today!  Everything looks pretty good today. Good job cutting back smoking! I will give you a printed prescription for nicotine patches. Try to stop without them if you can. Fill them if and when you need. If you feel like you want or need more help, call to make an appointment in pharmacy clinic with Dr. Valentina Lucks. He is our pharmacist and can help with lots of strategies for stopping smoking.  Keep taking the Xarelto and the amlodipine. If you're out shopping at a pharmacy or a [employer], check your blood pressure a couple times per week. If it is ever over 150 / 100, rest for several minutes and recheck it. If it stays high, come in to get checked out, and we'll adjust your medicine if you need to.  Otherwise, come back to see me in about 2 months or sooner if you need. If your leg gets worse, or start to look or feel like it did before, come back sooner. Please feel free to call with any questions or concerns at any time, at [phone]. --Dr. Venetia Maxon

## 2014-02-14 ENCOUNTER — Ambulatory Visit (INDEPENDENT_AMBULATORY_CARE_PROVIDER_SITE_OTHER): Payer: 59 | Admitting: Family Medicine

## 2014-02-14 ENCOUNTER — Encounter: Payer: Self-pay | Admitting: Family Medicine

## 2014-02-14 VITALS — BP 162/102 | HR 100 | Temp 98.1°F | Ht 75.0 in | Wt 278.0 lb

## 2014-02-14 DIAGNOSIS — I82401 Acute embolism and thrombosis of unspecified deep veins of right lower extremity: Secondary | ICD-10-CM

## 2014-02-14 DIAGNOSIS — Z720 Tobacco use: Secondary | ICD-10-CM

## 2014-02-14 DIAGNOSIS — I10 Essential (primary) hypertension: Secondary | ICD-10-CM

## 2014-02-14 MED ORDER — RIVAROXABAN 20 MG PO TABS: 20.0000 mg | ORAL_TABLET | Freq: Every day | ORAL | Status: DC

## 2014-02-14 MED ORDER — OXYCODONE-ACETAMINOPHEN 5-325 MG PO TABS: 1.0000 | ORAL_TABLET | Freq: Three times a day (TID) | ORAL | Status: DC | PRN

## 2014-02-14 NOTE — Patient Instructions (Signed)
Front desk: Please schedule Mr. Jeffrey [name] an appointment with Dr. Valentina Lucks (pharmacy clinic) to discuss Xarelto assistance.  Thank you for coming in, today!  Your blood pressure is high today, but I don't want to add any medicines, just yet. Keep checking it here and there, and call me or come back to see me if it's persistently over 150 on the top or 90 on the bottom.  For your Xarelto, I will give you some samples today. Come in to see Dr. Valentina Lucks in pharmacy clinic (the front desk will help you schedule this). He can help figure out some medication assistance for you.  Keep taking your medications without any changes. You can take Percocet 5-325, one pill up to every 8 hours for pain. Come back to see me in about 2 months, or sooner if you need.  Please feel free to call with any questions or concerns at any time, at [phone]. --Dr. Venetia Maxon

## 2014-02-14 NOTE — Assessment & Plan Note (Signed)
BP elevated today (pt has not taken medication), but reported at home more controlled. Hold off on increase of amlodipine or addition of new agent, for now. F/u in 2 months and consider increase at that point if indicated, and / or consider ACE or ARB. Pt instructed to monitor at pharmacy and to call or return to clinic sooner if BP is persistently over 140-150 / 90.

## 2014-02-14 NOTE — Progress Notes (Signed)
   Subjective:    [name], male    DOB: 05-02-1965, 48 y.o.   MRN: [id number]  HPI: Pt presents to clinic for f/u of DVT in right leg. He is compliant with Xarelto (though he has only about 4 tablets left and "needs help affording it,"). He has gone back to work, as his wife was "in an accident" and has been out of work, and is still occasionally using Percocet for pain, only 1-2 tablets per day. He feels like overall the swelling in his leg is better, though he does still have right-worse-than-left swelling, which is worse when he doesn't take his amlodipine (he did not take his amlodipine, yet, today); when he does have swelling, it gets better when he puts his feet up at night. He has minimal pain today, but states he has good days and bad days, similar to last visit.  He reports compliance with amlodipine but did miss his dose this morning, as above. He checks his BP when he is "out and about," and reports it's normally around 130-140 / 80 or so.  He has cut back his smoking to 1.5 packs per week or less, without assistance, and is continuing to try to quit completely.  Review of Systems: As above. He denies fever / chills, SOB, blood in his stool or sputum. He denies headache, change in vision, chest pain. Overall he feels "great."     Objective:   Physical Exam BP 172/108 mmHg  Pulse 100  Temp(Src) 98.1 F (36.7 C) (Oral)  Ht 6\' 3"  (1.905 m)  Wt 278 lb (126.1 kg)  BMI 34.75 kg/m2 Manual recheck: 162/102, pulse 95 Gen: well-appearing adult male in NAD HEENT: /AT, EOMI, PERRLA; conjunctivae without pallor Cardio: RRR, no murmur appreciated Pulm: CTAB, no wheezes, normal WOB Abd: soft, nontender, BS+ Ext: right calf slightly swollen compared to left, without redness / warmth No palpable cords, bilaterally; no tenderness in either calf No thigh swelling, redness, or tenderness bilaterally Bilateral mixed pitting / nonpitting  edema of LE's bilaterally, R (1+) > L (trace), with right higher onto shin  Overall swelling much improved from previous visits     Assessment & Plan:  See problem list notes.

## 2014-02-14 NOTE — Assessment & Plan Note (Signed)
Praised pt for continuing to cut back and continued to advise complete cessation. Pt declines further assistance at this time. F/u as needed; reiterated offer of f/u with pharmacy clinic and 1-800-QUIT-NOW number.

## 2014-02-14 NOTE — Assessment & Plan Note (Signed)
A: Known extensive DVT of RLE, on Xarelto and compliant. Continues to have clinical and subjective improvement in symptoms and physical exam. Still with occasional pain, well-controlled with Percocet, using less of this than previously.   P: Continue Xarelto for 6 months total (started Sept 2015, so will be done in March 2016). Samples (#15 of 20 mg tablets) given today and pt instructed to f/u with Dr. Valentina Lucks to discuss medication assistance due to cost. Refilled Percocet 5-325 1 tab q8 hours PRN, #90 to continue to wean. Continue elevation, rest, massage / exercises, etc, PRN. Need to consider hypercoagulable state work-up and definitely needs colonoscopy after coming off Xarelto, next year. F/u in about 2 months, or sooner if needed.

## 2014-02-19 ENCOUNTER — Ambulatory Visit: Payer: 59 | Admitting: Pharmacist

## 2014-02-19 ENCOUNTER — Telehealth: Payer: Self-pay | Admitting: Pharmacist

## 2014-02-19 NOTE — Telephone Encounter (Signed)
Made call to patient to f/u on missed appointment for assistance obtaining Xarelto.  Patient states coupon he recieved for Xarelto is still active and he was able to pick up medication from the pharmacy.  He should have adequate coverage with the coupon for the entire treatment course.   IF patient is in need of any additional Xarelto please know that it is available in our clinic as a sample.

## 2014-03-20 ENCOUNTER — Other Ambulatory Visit: Payer: Self-pay | Admitting: Family Medicine

## 2014-03-20 DIAGNOSIS — I82401 Acute embolism and thrombosis of unspecified deep veins of right lower extremity: Secondary | ICD-10-CM

## 2014-03-20 MED ORDER — OXYCODONE-ACETAMINOPHEN 5-325 MG PO TABS: 1.0000 | ORAL_TABLET | Freq: Three times a day (TID) | ORAL | Status: DC | PRN

## 2014-03-20 NOTE — Telephone Encounter (Signed)
Patient informed, expressed understanding. 

## 2014-03-20 NOTE — Telephone Encounter (Signed)
Rx left at front desk for pt to pick up at his convenience. Please let him know. Thanks! --CMS

## 2014-03-20 NOTE — Telephone Encounter (Signed)
Pt called and needs a refill on his pain medication left up front. jw

## 2014-04-05 ENCOUNTER — Ambulatory Visit: Payer: 59 | Admitting: Family Medicine

## 2014-04-06 ENCOUNTER — Ambulatory Visit (INDEPENDENT_AMBULATORY_CARE_PROVIDER_SITE_OTHER): Payer: 59 | Admitting: Family Medicine

## 2014-04-06 ENCOUNTER — Telehealth: Payer: Self-pay | Admitting: *Deleted

## 2014-04-06 ENCOUNTER — Encounter: Payer: Self-pay | Admitting: Family Medicine

## 2014-04-06 VITALS — BP 148/100 | HR 121 | Temp 97.9°F | Ht 75.0 in | Wt 279.7 lb

## 2014-04-06 DIAGNOSIS — I10 Essential (primary) hypertension: Secondary | ICD-10-CM

## 2014-04-06 DIAGNOSIS — G8929 Other chronic pain: Secondary | ICD-10-CM | POA: Insufficient documentation

## 2014-04-06 DIAGNOSIS — I82401 Acute embolism and thrombosis of unspecified deep veins of right lower extremity: Secondary | ICD-10-CM

## 2014-04-06 DIAGNOSIS — Z9189 Other specified personal risk factors, not elsewhere classified: Secondary | ICD-10-CM

## 2014-04-06 DIAGNOSIS — Z7189 Other specified counseling: Secondary | ICD-10-CM

## 2014-04-06 LAB — COMPREHENSIVE METABOLIC PANEL
ALBUMIN: 4.3 g/dL (ref 3.5–5.2)
ALT: 18 U/L (ref 0–53)
AST: 24 U/L (ref 0–37)
Alkaline Phosphatase: 72 U/L (ref 39–117)
BUN: 14 mg/dL (ref 6–23)
CO2: 23 mEq/L (ref 19–32)
Calcium: 9 mg/dL (ref 8.4–10.5)
Chloride: 105 mEq/L (ref 96–112)
Creat: 1.08 mg/dL (ref 0.50–1.35)
GLUCOSE: 93 mg/dL (ref 70–99)
Potassium: 4.3 mEq/L (ref 3.5–5.3)
Sodium: 136 mEq/L (ref 135–145)
Total Bilirubin: 0.6 mg/dL (ref 0.2–1.2)
Total Protein: 7.2 g/dL (ref 6.0–8.3)

## 2014-04-06 MED ORDER — AMLODIPINE BESYLATE 10 MG PO TABS
10.0000 mg | ORAL_TABLET | Freq: Every day | ORAL | Status: DC
Start: 2014-04-06 — End: 2014-04-06

## 2014-04-06 MED ORDER — AMLODIPINE BESYLATE 10 MG PO TABS: 10.0000 mg | ORAL_TABLET | Freq: Every day | ORAL | Status: DC

## 2014-04-06 MED ORDER — OXYCODONE-ACETAMINOPHEN 5-325 MG PO TABS: 1.0000 | ORAL_TABLET | Freq: Three times a day (TID) | ORAL | Status: DC | PRN

## 2014-04-06 MED ORDER — ATORVASTATIN CALCIUM 40 MG PO TABS: 40.0000 mg | ORAL_TABLET | Freq: Every day | ORAL | Status: DC

## 2014-04-06 NOTE — Telephone Encounter (Signed)
Pharmacist from [facility] called to request a verbal order to dispense pt's pain medication 11 days early.  Pt told pharmacy he was leaving to go out of town for work.  Verbal order given by Dr. Wendi Snipes for Pharmacist to dispense pain medication today.  Derl Barrow, RN

## 2014-04-06 NOTE — Patient Instructions (Addendum)
Great to meet you!  We started the lipitor today, 1 pill once daily Increase your amlodipine, Take 2 pills until you run out then the new Rx Come back in April.

## 2014-04-06 NOTE — Progress Notes (Signed)
[name], male   DOB: 07-05-1965, 49 y.o.   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 Has had some improvement with the steroid injection previously Also had a recent DVT Taking 1-2 Percocet daily States he will be out of town for work until the middle of March Discussed pain contract today, he states that he has signed this with his PCP. He's okay with urine test today - discussed that cannot prescribe if positive for controlled substances Last oxycodone was today  Smoking status noted ROS: Per HPI  Objective: BP 148/100 mmHg  Pulse 121  Temp(Src) 97.9 F (36.6 C) (Oral)  Ht 6\' 3"  (1.905 m)  Wt 279 lb 11.2 oz (126.871 kg)  BMI 34.96 kg/m2 Gen: NAD, alert, cooperative with exam HEENT: NCAT CV: RRR, good S1/S2, no murmur Resp: CTABL, no wheezes, non-labored Neuro: Alert and oriented, No gross deficits  Assessment and plan:  HTN (hypertension) Slightly elevated, still uncontrolled Increase amlodipine to 10 mg from 5 mg No red flags Follow-up in 2 months after he returns from his trip to [place] for statin therapy due to risk of future cardiovascular event Discussed with patient, start Lipitor today Follow-up with PCP in 2 months. CMP today  Encounter for chronic pain management For left knee osteoarthritis, perhaps some contribution of DVT related pain Refilled #90 Percocet 5 mg Follow-up with PCP, stated that he has signed a contract, however this is not seen in the chart Will need contract review with his PCP next appointment Urine test today    Orders Placed This Encounter  Procedures  . Drug Scr Ur,  Pain Mgmt, Reflex Conf  . Comprehensive metabolic panel    Meds ordered this encounter  Medications  . oxyCODONE-acetaminophen (ROXICET) 5-325 MG per tablet    Sig: Take 1 tablet by mouth every 8 (eight) hours as needed for severe pain.    Dispense:  90 tablet    Refill:  0  . DISCONTD: amLODipine (NORVASC) 10 MG tablet    Sig: Take 1 tablet (10 mg total) by mouth daily.    Dispense:  30 tablet    Refill:  3  . atorvastatin (LIPITOR) 40 MG tablet    Sig: Take 1 tablet (40 mg total) by mouth daily.    Dispense:  90 tablet    Refill:  3  . DISCONTD: amLODipine (NORVASC) 10 MG tablet    Sig: Take 1 tablet (10 mg total) by mouth daily.    Dispense:  30 tablet    Refill:  3  . amLODipine (NORVASC) 10 MG tablet    Sig: Take 1 tablet (10 mg total) by mouth daily.    Dispense:  90 tablet    Refill:  3

## 2014-04-06 NOTE — Assessment & Plan Note (Signed)
For left knee osteoarthritis, perhaps some contribution of DVT related pain Refilled #90 Percocet 5 mg Follow-up with PCP, stated that he has signed a contract, however this is not seen in the chart Will need contract review with his PCP next appointment Urine test today

## 2014-04-06 NOTE — Assessment & Plan Note (Signed)
Discussed with patient, start Lipitor today Follow-up with PCP in 2 months. CMP today

## 2014-04-06 NOTE — Assessment & Plan Note (Signed)
Slightly elevated, still uncontrolled Increase amlodipine to 10 mg from 5 mg No red flags Follow-up in 2 months after he returns from his trip to [place]

## 2014-04-07 LAB — DRUG SCR UR, PAIN MGMT, REFLEX CONF
Amphetamine Screen, Ur: NEGATIVE
BARBITURATE QUANT UR: NEGATIVE
Benzodiazepines.: NEGATIVE
COCAINE METABOLITES: NEGATIVE
CREATININE, U: 521.84 mg/dL
METHADONE: NEGATIVE
Opiates: NEGATIVE
Phencyclidine (PCP): NEGATIVE
Propoxyphene: NEGATIVE

## 2014-04-09 LAB — CANNABANOIDS (GC/LC/MS), URINE: THC-COOH UR CONFIRM: 658 ng/mL — AB (ref <5)

## 2015-07-16 ENCOUNTER — Encounter: Payer: Self-pay | Admitting: Family Medicine

## 2015-07-16 ENCOUNTER — Ambulatory Visit (INDEPENDENT_AMBULATORY_CARE_PROVIDER_SITE_OTHER): Payer: 59 | Admitting: Family Medicine

## 2015-07-16 ENCOUNTER — Ambulatory Visit (HOSPITAL_COMMUNITY)
Admission: RE | Admit: 2015-07-16 | Discharge: 2015-07-16 | Disposition: A | Discharge status: Home / Self Care | Payer: 59 | Source: Ambulatory Visit | Attending: Family Medicine | Admitting: Family Medicine

## 2015-07-16 ENCOUNTER — Telehealth: Payer: Self-pay | Admitting: Family Medicine

## 2015-07-16 ENCOUNTER — Other Ambulatory Visit: Payer: Self-pay | Admitting: Family Medicine

## 2015-07-16 VITALS — BP 158/108 | HR 90 | Temp 98.3°F | Wt 251.0 lb

## 2015-07-16 DIAGNOSIS — R634 Abnormal weight loss: Secondary | ICD-10-CM | POA: Diagnosis not present

## 2015-07-16 DIAGNOSIS — I825Y1 Chronic embolism and thrombosis of unspecified deep veins of right proximal lower extremity: Secondary | ICD-10-CM

## 2015-07-16 DIAGNOSIS — R609 Edema, unspecified: Secondary | ICD-10-CM

## 2015-07-16 DIAGNOSIS — I82811 Embolism and thrombosis of superficial veins of right lower extremities: Secondary | ICD-10-CM | POA: Insufficient documentation

## 2015-07-16 DIAGNOSIS — I82401 Acute embolism and thrombosis of unspecified deep veins of right lower extremity: Secondary | ICD-10-CM | POA: Diagnosis not present

## 2015-07-16 DIAGNOSIS — I82409 Acute embolism and thrombosis of unspecified deep veins of unspecified lower extremity: Secondary | ICD-10-CM | POA: Diagnosis not present

## 2015-07-16 LAB — COMPLETE METABOLIC PANEL WITH GFR
ALT: 20 U/L (ref 9–46)
AST: 27 U/L (ref 10–35)
Albumin: 4.2 g/dL (ref 3.6–5.1)
Alkaline Phosphatase: 66 U/L (ref 40–115)
BILIRUBIN TOTAL: 0.5 mg/dL (ref 0.2–1.2)
BUN: 9 mg/dL (ref 7–25)
CHLORIDE: 105 mmol/L (ref 98–110)
CO2: 26 mmol/L (ref 20–31)
Calcium: 8.9 mg/dL (ref 8.6–10.3)
Creat: 1.02 mg/dL (ref 0.70–1.33)
GFR, EST NON AFRICAN AMERICAN: 85 mL/min (ref >=60)
Glucose, Bld: 92 mg/dL (ref 65–99)
Potassium: 4.9 mmol/L (ref 3.5–5.3)
Sodium: 138 mmol/L (ref 135–146)
Total Protein: 7.1 g/dL (ref 6.1–8.1)

## 2015-07-16 LAB — CBC WITH DIFFERENTIAL/PLATELET
BASOS PCT: 0 %
Basophils Absolute: 0 cells/uL (ref 0–200)
Eosinophils Absolute: 305 cells/uL (ref 15–500)
Eosinophils Relative: 5 %
HCT: 45.5 % (ref 38.5–50.0)
Hemoglobin: 15.5 g/dL (ref 13.2–17.1)
Lymphocytes Relative: 35 %
Lymphs Abs: 2135 cells/uL (ref 850–3900)
MCH: 30.2 pg (ref 27.0–33.0)
MCHC: 34.1 g/dL (ref 32.0–36.0)
MCV: 88.7 fL (ref 80.0–100.0)
MONO ABS: 671 {cells}/uL (ref 200–950)
MONOS PCT: 11 %
MPV: 9.6 fL (ref 7.5–12.5)
NEUTROS ABS: 2989 {cells}/uL (ref 1500–7800)
Neutrophils Relative %: 49 %
PLATELETS: 145 10*3/uL (ref 140–400)
RBC: 5.13 MIL/uL (ref 4.20–5.80)
RDW: 14.5 % (ref 11.0–15.0)
WBC: 6.1 10*3/uL (ref 3.8–10.8)

## 2015-07-16 MED ORDER — RIVAROXABAN (XARELTO) VTE STARTER PACK (15 & 20 MG): ORAL_TABLET | ORAL | Status: DC

## 2015-07-16 MED ORDER — MELOXICAM 15 MG PO TABS: 15.0000 mg | ORAL_TABLET | Freq: Every day | ORAL | Status: DC

## 2015-07-16 MED FILL — MELOXICAM 15 MG TABLET: 15 | 40 days supply | Qty: 40 | Fill #0

## 2015-07-16 MED FILL — XARELTO STARTER PACK: 15 & 20 | 28 days supply | Qty: 51 | Fill #0

## 2015-07-16 NOTE — Progress Notes (Addendum)
VASCULAR LAB PRELIMINARY  PRELIMINARY  PRELIMINARY  PRELIMINARY  Right lower extremity venous duplex completed.    Preliminary report:  Positive for an Chronic DVT noted throughout the right lower extremity with mild recanalization from the posterior tibial vein through the common femoral vein. No evidence of an acute DVT.  Positive for a Chronic superficial thrombus from below the knee to the saphenofemoral junction. No evidence of an acute superficial thrombosis of Baker's cyst.  Called report to DR [facility], [place], RVS 07/16/2015, 3:30 PM

## 2015-07-16 NOTE — Progress Notes (Signed)
[name], male   DOB: 10/10/1965, 50 y.o.   MRN: [id number]   [facility] Aquilla Hacker, MD Phone: [phone]  Subjective:   # SDA appt. Thinks he has DVT on R - Has had DVT in the past. 11/2013.  - Unknown etiology at that time. - Was unable to get further testing at that time to determine etiology.  - Was on Xarelto for 6 months and then told to stop.  - Has not had since stopping the xarelto.  - Comes in today with swelling and leg pain on the right similar to DVT in the past ongoing for about one week.  - He says he has some erythema there.  - Pain is behind his knee.  - Some ankle swelling on R> L as well.  - No injury to this leg.  - No surgery recently.  - No recent travel.  - Pretty active at this point. Rides his bike around.  - Weight loss 30lb 1 year, was trying to lose weight, also recently working Architect.  - Endorses night sweats.  - Good appetite.  - Smoking 1/2 ppd for 38 years.  - Family History of Cancer Uncle - pancreatic, Cousin - prostate cancer  , Brother -  - Drinks 1-2 beers / day.  - Marijuana every now and then.  - Colonoscopy not up to date.   All relevant systems were reviewed and were negative unless otherwise noted in the HPI  Past Medical History Reviewed problem list.  Medications- reviewed and updated Current Outpatient Prescriptions  Medication Sig Dispense Refill  . amLODipine (NORVASC) 10 MG tablet Take 1 tablet (10 mg total) by mouth daily. 90 tablet 3  . atorvastatin (LIPITOR) 40 MG tablet Take 1 tablet (40 mg total) by mouth daily. 90 tablet 3  . meloxicam (MOBIC) 15 MG tablet Take 1 tablet (15 mg total) by mouth daily. 40 tablet 1  . Menthol, Topical Analgesic, (ICY HOT EX) Apply 1 application topically every 4 (four) hours as needed (pain/swelling).    . nicotine (NICODERM CQ - DOSED IN MG/24 HOURS) 14 mg/24hr patch Place 1 patch (14 mg total) onto the skin daily. (Patient not taking:  Reported on 04/06/2014) 28 patch 0  . oxyCODONE-acetaminophen (ROXICET) 5-325 MG per tablet Take 1 tablet by mouth every 8 (eight) hours as needed for severe pain. 90 tablet 0  . rivaroxaban (XARELTO) 20 MG TABS tablet Take 1 tablet (20 mg total) by mouth daily with supper. START taking on 12/20/13. 15 tablet 0   No current facility-administered medications for this visit.   Chief complaint-noted No additions to family history Social history- patient is a current 1/2 ppd smoker  Objective: BP 169/122 mmHg  Pulse 90  Temp(Src) 98.3 F (36.8 C) (Oral)  Wt 251 lb (113.853 kg) Gen: NAD, alert, cooperative with exam HEENT: NCAT, EOMI, PERRL Neck: FROM, supple CV: RRR, good S1/S2, no murmur Resp: CTABL, no wheezes, non-labored Abd: SNTND, BS present, no guarding or organomegaly Ext: 1+ edema on the right warm, normal tone, moves UE/LE spontaneously - neg homan's sign.  - popliteal tenderness on the right.  - No significant swelling of popliteal fossa, erythema.  Neuro: Alert and oriented, No gross deficits Skin: no rashes no lesions  Assessment/Plan:  # Suspected DVT of R Leg - exam not overly impressive for DVT but definitely different R to Left. Concern given unprovoked nature of previous DVT, family hx of cancer, and pt. With  some B symptoms as above.  - CMET - CBC - Recommend colonoscopy - Will order [place] RLE.  - Hold off on restarting xarelto for now

## 2015-07-16 NOTE — Telephone Encounter (Signed)
Called by [facility]. He has what appears to be chronic DVT in multiple large vessels. No acute DVT visible. Will restart him on Xarelto 15mg  BID for now. F/U with PCP. Called pt. To discuss this with him. Rx sent to pharmacy.   CGM MD

## 2015-07-16 NOTE — Patient Instructions (Signed)
We will get an ultrasound of your leg.   We will get bloodwork today. I will call you with these results.   Your blood pressure is high today. Make an appointment to see your primary doctor in 2-3 weeks.   Restart your blood pressure medicine today.   Thanks for letting us take care of you  Sincerely, Paula Compton, MD

## 2015-08-05 ENCOUNTER — Telehealth: Payer: Self-pay | Admitting: Family Medicine

## 2015-08-05 NOTE — Telephone Encounter (Signed)
Pt called because he is still having knee pain and would like the doctor to write a prescription of Percocet for the pain. jw

## 2015-08-06 NOTE — Telephone Encounter (Signed)
LMOVM for pt to call us back. See message below. Kaylena Pacifico, CMA  

## 2015-08-06 NOTE — Telephone Encounter (Signed)
Please let the patient know if he is still having knee pain he needs to come in to be re-evaluated.   Thanks, Archie Patten, MD [facility] Resident  08/06/2015, 8:28 AM

## 2015-08-08 ENCOUNTER — Ambulatory Visit (INDEPENDENT_AMBULATORY_CARE_PROVIDER_SITE_OTHER): Payer: 59 | Admitting: Family Medicine

## 2015-08-08 ENCOUNTER — Encounter: Payer: Self-pay | Admitting: Family Medicine

## 2015-08-08 VITALS — BP 119/83 | HR 81 | Temp 98.3°F | Ht 75.0 in | Wt 253.1 lb

## 2015-08-08 DIAGNOSIS — M1711 Unilateral primary osteoarthritis, right knee: Secondary | ICD-10-CM

## 2015-08-08 MED ORDER — TRAMADOL HCL 50 MG PO TABS: 50.0000 mg | ORAL_TABLET | Freq: Four times a day (QID) | ORAL | Status: DC | PRN

## 2015-08-08 MED ORDER — METHYLPREDNISOLONE ACETATE 80 MG/ML IJ SUSP
80.0000 mg | Freq: Once | INTRAMUSCULAR | Status: AC
  Administered 2015-08-08: 80 mg via INTRA_ARTICULAR

## 2015-08-08 NOTE — Patient Instructions (Signed)
Thank you for coming in today!  - Take tylenol on a scheduled basis to help with pain and take ultram only as needed to manage your pain.  - Continue relative rest, ice, elevation.  - Follow up with your PCP or with orthopedics  Our clinic's number is [phone]. Feel free to call any time with questions or concerns. We will answer any questions after hours with our 24-hour emergency line at that number as well.   - Dr. Bonner Puna

## 2015-08-08 NOTE — Assessment & Plan Note (Signed)
With tricompartmental involvement and meniscal symptoms, not currently a  candidate for arthroscopy due to DVT.  - Tylenol 650 mg TID scheduled and ultram prn (caution with h/o substance abuse) - Injected today - Follow up with orthopedics (seen at [facility])

## 2015-08-08 NOTE — Progress Notes (Signed)
Subjective:     [name], male   DOB: 04/07/1965, 50 y.o.   MRN: [id number]  HPI Jeffrey [name] is a 50 y.o. Male with a history of right leg DVT and osteoarthritis who presents with chronic right leg and right groin pain.   Right Knee/groin pain: Pain is worse in the morning and night, described as sharp 9/10 pain radiating from the right knee to the right groin. This has been going on for years and says his knee is "bone on bone." He wakes up and endorses popping sounds in his knee. He was prescribed Meloxicam in the past for knee pain but says it did not help. He says he has received a glucocorticoid injection in the past and it helped temporarily. Uses a cane to walk and says stretching and elevation helps relieve some of the pain. Is concerned because pain is preventing him from sleeping and says he only gets 4-5 hours of disrupted sleep. 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 The pain has also affected his gait and is beginning to cause lower back pain. He was originally scheduled for a knee replacement but that was put on hold when he had a DVT in 2015. Has a history of knee surgery bilaterally. Denies fever, nausea, headache, vomiting, SOB, or diarrhea.  DVT: Had DVT in 2015 before knee surgery was scheduled. Right leg is significantly swollen. Started anticoagulation therapy for 6 months but stopped. Recently restarted therapy after feeling pain in his groin and getting reevaluated for DVT. Says the old clot is still there and is currently on week 3 of re-treatment.   Review of Systems Normal other than stated in HPI    Objective:   Physical Exam Filed Vitals:   08/08/15 1412  BP: 119/83  Pulse: 81  Temp: 98.3 F (36.8 C)  Blood pressure 119/83, pulse 81, temperature 98.3 F (36.8 C), temperature source Oral, height 6\' 3"  (1.905 m), weight 253 lb 1.6 oz (114.805 kg).  Gen: Uncomfortable appearing but otherwise pleasant patient.  HEENT:  MMM Pulm: Clear to auscultation bilaterally, no increased work of breathing CV: 3+ LE edema in right leg. RRR, no murmurs, rubs, or gallops MSK Right Knee: Significant swelling of right leg compared to left leg. Significant tenderness to palpation medial joint line of right knee. Normal range of motion with crepitus. No overlaying erythema.   PROCEDURE NOTE: RIGHT KNEE INJECTION: Patient was given informed consent, signed copy in the chart. Appropriate time out was taken. Area prepped and draped in usual sterile fashion. 1 cc of methylprednisolone 80 mg/ml mixed in 4 cc of 1% lidocaine without epinephrine was injected into the right knee anterolateral approach. The patient tolerated the procedure well. There were no complications. Post procedure instructions were given.    Assessment:     Jeffrey [name] is a 50 y.o. Male with a history of right leg DVT and osteoarthritis who presents with chronic right leg and right groin pain concerning for osteoarthritis complicated by right knee meniscal tear and right sided DVT given history of DVT, physical exam findings, and surgical knee history. Septic joint unlikely given absence of fever, physical exam findings, and chronicity of symptoms.      Plan:     Knee pain:  -Glucocorticoid injection today -Tramadol 50 MG by mouth every 6 hours as needed.  -Recommend physical therapy for knee pain -Recommend seeing in 3 months for further evaluation of DVT and additional glucocorticoid injection if needed.  DVT:  -Recommend continuing anticoagulation therapy  -Reevaluate in 3 months

## 2015-08-13 MED FILL — traMADol HCL 50 MG TABS: 50 | 7 days supply | Qty: 30 | Fill #0

## 2015-09-16 ENCOUNTER — Other Ambulatory Visit: Payer: Self-pay | Admitting: *Deleted

## 2015-09-16 DIAGNOSIS — I10 Essential (primary) hypertension: Secondary | ICD-10-CM

## 2015-09-16 DIAGNOSIS — I825Y1 Chronic embolism and thrombosis of unspecified deep veins of right proximal lower extremity: Secondary | ICD-10-CM

## 2015-09-16 DIAGNOSIS — M1711 Unilateral primary osteoarthritis, right knee: Secondary | ICD-10-CM

## 2015-09-16 MED ORDER — RIVAROXABAN 20 MG PO TABS
20.0000 mg | ORAL_TABLET | Freq: Every day | ORAL | Status: DC
Start: 2015-09-16 — End: 2016-05-26

## 2015-09-16 MED ORDER — AMLODIPINE BESYLATE 10 MG PO TABS: 10.0000 mg | ORAL_TABLET | Freq: Every day | ORAL | Status: DC

## 2015-09-16 NOTE — Telephone Encounter (Signed)
Patient called needing a refill on his medications. Jazmin Hartsell,CMA

## 2015-09-16 NOTE — Telephone Encounter (Signed)
I refilled his amlodipine. Refilled Xarelto 20mg  daily (instead of the starter pack). Did NOT refill tramadol. He needs to follow up in clinic for narcotic prescriptions.   Archie Patten, MD [facility] Resident  09/16/2015, 4:31 PM

## 2015-09-26 ENCOUNTER — Other Ambulatory Visit: Payer: Self-pay | Admitting: Family Medicine

## 2015-09-27 NOTE — Telephone Encounter (Signed)
Left message on voicemail, informing of message from MD.

## 2015-09-27 NOTE — Telephone Encounter (Signed)
Please let the patient know I called in a Rx for tramadol. He will need to follow up with ortho as instructed by Dr. Bonner Puna. I will not refill anymore tramadol without a clinic appt.  Thanks, Archie Patten, MD [facility] Resident  09/27/2015, 8:49 AM

## 2015-09-30 ENCOUNTER — Ambulatory Visit: Payer: 59 | Admitting: Family Medicine

## 2015-10-07 MED FILL — AMLODIPINE BESYLATE 10 MG T: 10 | 90 days supply | Qty: 90 | Fill #0

## 2015-10-07 MED FILL — XARELTO 20 MG TABLET: 20 | 30 days supply | Qty: 30 | Fill #0

## 2016-02-25 MED FILL — XARELTO 20 MG TABLET: 20 | 30 days supply | Qty: 30 | Fill #1

## 2016-02-25 MED FILL — traMADol HCL 50 MG TABS: 50 | 7 days supply | Qty: 30 | Fill #0

## 2016-02-25 MED FILL — AMLODIPINE BESYLATE 10 MG T: 10 | 90 days supply | Qty: 90 | Fill #1

## 2016-05-26 ENCOUNTER — Encounter: Payer: Self-pay | Admitting: Family Medicine

## 2016-05-26 ENCOUNTER — Other Ambulatory Visit: Payer: Self-pay | Admitting: Family Medicine

## 2016-05-26 ENCOUNTER — Other Ambulatory Visit: Payer: Self-pay | Admitting: *Deleted

## 2016-05-26 ENCOUNTER — Ambulatory Visit (HOSPITAL_COMMUNITY)
Admission: RE | Admit: 2016-05-26 | Discharge: 2016-05-26 | Disposition: A | Discharge status: Home / Self Care | Payer: 59 | Source: Ambulatory Visit | Attending: Family Medicine | Admitting: Family Medicine

## 2016-05-26 ENCOUNTER — Ambulatory Visit (INDEPENDENT_AMBULATORY_CARE_PROVIDER_SITE_OTHER): Payer: 59 | Admitting: Family Medicine

## 2016-05-26 VITALS — BP 150/102 | HR 91 | Temp 98.3°F | Wt 256.0 lb

## 2016-05-26 DIAGNOSIS — R0602 Shortness of breath: Secondary | ICD-10-CM | POA: Diagnosis not present

## 2016-05-26 DIAGNOSIS — R05 Cough: Secondary | ICD-10-CM | POA: Diagnosis not present

## 2016-05-26 DIAGNOSIS — J90 Pleural effusion, not elsewhere classified: Secondary | ICD-10-CM | POA: Insufficient documentation

## 2016-05-26 MED ORDER — ALBUTEROL SULFATE (2.5 MG/3ML) 0.083% IN NEBU
2.5000 mg | INHALATION_SOLUTION | Freq: Once | RESPIRATORY_TRACT | Status: AC
Start: 2016-05-26 — End: 2016-05-26
  Administered 2016-05-26: 2.5 mg via RESPIRATORY_TRACT

## 2016-05-26 MED ORDER — IPRATROPIUM BROMIDE 0.02 % IN SOLN
0.5000 mg | Freq: Once | RESPIRATORY_TRACT | Status: AC
  Administered 2016-05-26: 0.5 mg via RESPIRATORY_TRACT

## 2016-05-26 MED ORDER — ALBUTEROL SULFATE HFA 108 (90 BASE) MCG/ACT IN AERS: 2.0000 | INHALATION_SPRAY | Freq: Four times a day (QID) | RESPIRATORY_TRACT | 0 refills | Status: DC | PRN

## 2016-05-26 MED ORDER — IPRATROPIUM-ALBUTEROL 0.5-2.5 (3) MG/3ML IN SOLN: 3.0000 mL | Freq: Once | RESPIRATORY_TRACT | Status: DC

## 2016-05-26 MED FILL — VENTOLIN HFA 90 MCG INHALER: 108 (90 BAS | 25 days supply | Qty: 18 | Fill #0

## 2016-05-26 NOTE — Progress Notes (Signed)
   Subjective: CC: SOB [id number] Jeffrey [name] is a 51 y.o. male presenting to clinic today for same day appointment. PCP: Kathrine Cords, MD Concerns today include:  1. SOB Patient reports gradually increasing SOB with exertion.  He reports that he has been having DOE while walking from living room to kitchen. He reports that this has been worsening over the last 2 weeks. He reports cough and congestion for a couple weeks now.  He reports occ night sweats that is baseline for him.  No fevers, sick contacts, recent travel.  He reports compliance with Xarelto.  He is an active smoker, smoking 3 packs per week x 30+ years.  Denies LE edema or swelling.  Denies orthopnea.  No Known Allergies  Social Hx reviewed. MedHx, current medications and allergies reviewed.  Please see EMR. ROS: Per HPI  Objective: Office vital signs reviewed. BP (!) 150/102   Pulse 91   Temp 98.3 F (36.8 C) (Oral)   Wt 256 lb (116.1 kg)   SpO2 92%   BMI 32.00 kg/m   Physical Examination:  General: Awake, alert, well nourished, No acute distress, nontoxic appearing HEENT: Normal    Nose: nasal turbinates moist, clear nasal discharge    Throat: moist mucus membranes Cardio: regular rate and rhythm, S1S2 heard, no murmurs appreciated Pulm: slight expiratory wheezes, no rhonchi or rales; normal work of breathing on room air Extremities: warm, well perfused, No edema, cyanosis or clubbing; +2 pulses bilaterally MSK: normal gait and normal station  Assessment/ Plan: 51 y.o. male   1. Shortness of breath.  I suspect that he is having bronchospasm related to viral URI.  He is a long time smoker.  O2 saturation initially 92% on RA.  This improved to 98% on RA after Duoneb administered in office.  Resolution of wheeze after duoneb as well.  No diagnosis of COPD/ Asthma.  No evidence of fluid overload on exam to explain symptoms.  No evidence of bacterial infection on exam.  Will obtain CXR to evaluate for other  processes.   - DG Chest 2 View; Future - albuterol (PROVENTIL HFA;VENTOLIN HFA) 108 (90 Base) MCG/ACT inhaler; Inhale 2 puffs into the lungs every 6 (six) hours as needed for wheezing or shortness of breath.  Dispense: 1 Inhaler; Refill: 0 - albuterol (PROVENTIL) (2.5 MG/3ML) 0.083% nebulizer solution 2.5 mg; Take 3 mLs (2.5 mg total) by nebulization once. - ipratropium (ATROVENT) nebulizer solution 0.5 mg; Take 2.5 mLs (0.5 mg total) by nebulization once. - Home care instructions reviewed - Smoking cessation encouraged - Follow up with PCP prn  Additionally, patient with uncontrolled hypertension.  I recommended that he follow up closely for BP management.    [name], DO PGY-3, [facility]

## 2016-05-26 NOTE — Patient Instructions (Signed)
I have ordered a chest xray to evaluate your lungs more closely.  I highly recommend that you STOP smoking.  This is the best thing that you can do for your health.  I have also sent in an inhaler for you to use for your symptoms.  Use ONLY as needed for shortness of breath or wheeze.  If your symptoms persist or get worse, I recommend that you come back for reevaluation.

## 2016-05-27 NOTE — Telephone Encounter (Signed)
Tramadol not refilled. Pt has not been seen in our clinic for pain since 07/2015, he needs to make an appt in our clinic to be evaluated for controlled substances.  Thanks, Archie Patten, MD [facility] Resident  05/27/2016, 2:09 PM

## 2016-06-03 ENCOUNTER — Emergency Department (HOSPITAL_COMMUNITY): Payer: 59

## 2016-06-03 ENCOUNTER — Other Ambulatory Visit: Payer: Self-pay

## 2016-06-03 ENCOUNTER — Ambulatory Visit (HOSPITAL_COMMUNITY)
Admission: RE | Admit: 2016-06-03 | Discharge: 2016-06-03 | Disposition: A | Discharge status: Home / Self Care | Payer: 59 | Source: Ambulatory Visit | Attending: Family Medicine | Admitting: Family Medicine

## 2016-06-03 ENCOUNTER — Ambulatory Visit (INDEPENDENT_AMBULATORY_CARE_PROVIDER_SITE_OTHER): Payer: 59 | Admitting: Family Medicine

## 2016-06-03 ENCOUNTER — Inpatient Hospital Stay (HOSPITAL_COMMUNITY)
Admission: EM | Admit: 2016-06-03 | Discharge: 2016-06-05 | DRG: 175 | Disposition: A | Discharge status: Home / Self Care | Payer: 59 | Attending: Family Medicine | Admitting: Family Medicine

## 2016-06-03 ENCOUNTER — Encounter (HOSPITAL_COMMUNITY): Payer: Self-pay | Admitting: *Deleted

## 2016-06-03 VITALS — BP 152/102 | HR 107 | Ht 75.5 in | Wt 256.0 lb

## 2016-06-03 DIAGNOSIS — Z720 Tobacco use: Secondary | ICD-10-CM | POA: Diagnosis present

## 2016-06-03 DIAGNOSIS — R9431 Abnormal electrocardiogram [ECG] [EKG]: Secondary | ICD-10-CM

## 2016-06-03 DIAGNOSIS — Z791 Long term (current) use of non-steroidal anti-inflammatories (NSAID): Secondary | ICD-10-CM

## 2016-06-03 DIAGNOSIS — I4581 Long QT syndrome: Secondary | ICD-10-CM

## 2016-06-03 DIAGNOSIS — Z23 Encounter for immunization: Secondary | ICD-10-CM

## 2016-06-03 DIAGNOSIS — I2699 Other pulmonary embolism without acute cor pulmonale: Secondary | ICD-10-CM | POA: Diagnosis present

## 2016-06-03 DIAGNOSIS — Z7901 Long term (current) use of anticoagulants: Secondary | ICD-10-CM | POA: Diagnosis not present

## 2016-06-03 DIAGNOSIS — M25562 Pain in left knee: Secondary | ICD-10-CM

## 2016-06-03 DIAGNOSIS — R079 Chest pain, unspecified: Secondary | ICD-10-CM | POA: Diagnosis not present

## 2016-06-03 DIAGNOSIS — Z8249 Family history of ischemic heart disease and other diseases of the circulatory system: Secondary | ICD-10-CM | POA: Diagnosis not present

## 2016-06-03 DIAGNOSIS — J9601 Acute respiratory failure with hypoxia: Secondary | ICD-10-CM | POA: Diagnosis present

## 2016-06-03 DIAGNOSIS — Z6833 Body mass index (BMI) 33.0-33.9, adult: Secondary | ICD-10-CM

## 2016-06-03 DIAGNOSIS — I2609 Other pulmonary embolism with acute cor pulmonale: Secondary | ICD-10-CM | POA: Diagnosis not present

## 2016-06-03 DIAGNOSIS — M25561 Pain in right knee: Secondary | ICD-10-CM | POA: Diagnosis not present

## 2016-06-03 DIAGNOSIS — G8929 Other chronic pain: Secondary | ICD-10-CM

## 2016-06-03 DIAGNOSIS — Z86718 Personal history of other venous thrombosis and embolism: Secondary | ICD-10-CM | POA: Diagnosis not present

## 2016-06-03 DIAGNOSIS — I82409 Acute embolism and thrombosis of unspecified deep veins of unspecified lower extremity: Secondary | ICD-10-CM | POA: Diagnosis present

## 2016-06-03 DIAGNOSIS — M545 Low back pain, unspecified: Secondary | ICD-10-CM | POA: Diagnosis present

## 2016-06-03 DIAGNOSIS — R0602 Shortness of breath: Secondary | ICD-10-CM | POA: Diagnosis not present

## 2016-06-03 DIAGNOSIS — F17210 Nicotine dependence, cigarettes, uncomplicated: Secondary | ICD-10-CM | POA: Diagnosis present

## 2016-06-03 DIAGNOSIS — R0902 Hypoxemia: Secondary | ICD-10-CM

## 2016-06-03 DIAGNOSIS — I10 Essential (primary) hypertension: Secondary | ICD-10-CM | POA: Diagnosis not present

## 2016-06-03 DIAGNOSIS — Z79899 Other long term (current) drug therapy: Secondary | ICD-10-CM

## 2016-06-03 LAB — CBC
HCT: 47 % (ref 39.0–52.0)
Hemoglobin: 16.2 g/dL (ref 13.0–17.0)
MCH: 30.1 pg (ref 26.0–34.0)
MCHC: 34.5 g/dL (ref 30.0–36.0)
MCV: 87.4 fL (ref 78.0–100.0)
PLATELETS: 127 10*3/uL — AB (ref 150–400)
RBC: 5.38 MIL/uL (ref 4.22–5.81)
RDW: 14.7 % (ref 11.5–15.5)
WBC: 6.5 10*3/uL (ref 4.0–10.5)

## 2016-06-03 LAB — BASIC METABOLIC PANEL
ANION GAP: 10 (ref 5–15)
BUN: 11 mg/dL (ref 6–20)
CALCIUM: 8.8 mg/dL — AB (ref 8.9–10.3)
CO2: 22 mmol/L (ref 22–32)
Chloride: 104 mmol/L (ref 101–111)
Creatinine, Ser: 1.01 mg/dL (ref 0.61–1.24)
GFR calc non Af Amer: >60 mL/min (ref >60)
Glucose, Bld: 112 mg/dL — ABNORMAL HIGH (ref 65–99)
Potassium: 4.1 mmol/L (ref 3.5–5.1)
Sodium: 136 mmol/L (ref 135–145)

## 2016-06-03 LAB — I-STAT TROPONIN, ED
TROPONIN I, POC: 0.04 ng/mL (ref 0.00–0.08)
Troponin i, poc: 0.04 ng/mL (ref 0.00–0.08)

## 2016-06-03 LAB — TROPONIN I: Troponin I: 0.03 ng/mL (ref <0.03)

## 2016-06-03 LAB — HEPARIN LEVEL (UNFRACTIONATED): Heparin Unfractionated: >2.2 IU/mL — ABNORMAL HIGH (ref 0.30–0.70)

## 2016-06-03 LAB — PROTIME-INR
INR: 1.5
PROTHROMBIN TIME: 18.2 s — AB (ref 11.4–15.2)

## 2016-06-03 LAB — BRAIN NATRIURETIC PEPTIDE: B Natriuretic Peptide: 67.8 pg/mL (ref 0.0–100.0)

## 2016-06-03 LAB — APTT
APTT: 36 s (ref 24–36)
APTT: 69 s — AB (ref 24–36)

## 2016-06-03 LAB — I-STAT CG4 LACTIC ACID, ED: Lactic Acid, Venous: 1.85 mmol/L (ref 0.5–1.9)

## 2016-06-03 LAB — MRSA PCR SCREENING: MRSA by PCR: NEGATIVE

## 2016-06-03 MED ORDER — NICOTINE 7 MG/24HR TD PT24: 7.0000 mg | MEDICATED_PATCH | Freq: Every day | TRANSDERMAL | Status: DC

## 2016-06-03 MED ORDER — NICOTINE 7 MG/24HR TD PT24
7.0000 mg | MEDICATED_PATCH | Freq: Every day | TRANSDERMAL | Status: DC
  Administered 2016-06-03 – 2016-06-05 (×3): 7 mg via TRANSDERMAL
  Filled 2016-06-03 (×3): qty 1

## 2016-06-03 MED ORDER — ASPIRIN 325 MG PO TABS
325.0000 mg | ORAL_TABLET | Freq: Once | ORAL | Status: AC
  Administered 2016-06-03: 325 mg via ORAL

## 2016-06-03 MED ORDER — NITROGLYCERIN 0.4 MG SL SUBL
0.4000 mg | SUBLINGUAL_TABLET | SUBLINGUAL | Status: DC | PRN
  Administered 2016-06-03: 0.4 mg via SUBLINGUAL
  Filled 2016-06-03: qty 1

## 2016-06-03 MED ORDER — PNEUMOCOCCAL VAC POLYVALENT 25 MCG/0.5ML IJ INJ
0.5000 mL | INJECTION | INTRAMUSCULAR | Status: AC
  Administered 2016-06-05: 0.5 mL via INTRAMUSCULAR
  Filled 2016-06-03: qty 0.5

## 2016-06-03 MED ORDER — TRAMADOL HCL 50 MG PO TABS: 50.0000 mg | ORAL_TABLET | Freq: Four times a day (QID) | ORAL | Status: DC | PRN

## 2016-06-03 MED ORDER — SODIUM CHLORIDE 0.9% FLUSH
3.0000 mL | Freq: Two times a day (BID) | INTRAVENOUS | Status: DC
  Administered 2016-06-04: 3 mL via INTRAVENOUS

## 2016-06-03 MED ORDER — NITROGLYCERIN 0.4 MG SL SUBL: 0.4000 mg | SUBLINGUAL_TABLET | SUBLINGUAL | Status: DC | PRN

## 2016-06-03 MED ORDER — HEPARIN (PORCINE) IN NACL 100-0.45 UNIT/ML-% IJ SOLN
2000.0000 [IU]/h | INTRAMUSCULAR | Status: DC
  Administered 2016-06-03: 1900 [IU]/h via INTRAVENOUS
  Administered 2016-06-04 (×2): 2000 [IU]/h via INTRAVENOUS
  Filled 2016-06-03 (×4): qty 250

## 2016-06-03 MED ORDER — INFLUENZA VAC SPLIT QUAD 0.5 ML IM SUSY
0.5000 mL | PREFILLED_SYRINGE | INTRAMUSCULAR | Status: AC
  Administered 2016-06-05: 0.5 mL via INTRAMUSCULAR
  Filled 2016-06-03: qty 0.5

## 2016-06-03 MED ORDER — IOPAMIDOL (ISOVUE-370) INJECTION 76%
INTRAVENOUS | Status: AC
Start: 2016-06-03 — End: 2016-06-03
  Administered 2016-06-03: 100 mL
  Filled 2016-06-03: qty 100

## 2016-06-03 MED ORDER — HEPARIN SODIUM (PORCINE) 5000 UNIT/ML IJ SOLN: 4000.0000 [IU] | Freq: Once | INTRAMUSCULAR | Status: DC

## 2016-06-03 MED ORDER — AMLODIPINE BESYLATE 10 MG PO TABS
10.0000 mg | ORAL_TABLET | Freq: Every day | ORAL | Status: DC
  Administered 2016-06-04 – 2016-06-05 (×2): 10 mg via ORAL
  Filled 2016-06-03 (×2): qty 1

## 2016-06-03 MED ORDER — NICOTINE 14 MG/24HR TD PT24: 14.0000 mg | MEDICATED_PATCH | Freq: Every day | TRANSDERMAL | Status: DC

## 2016-06-03 MED ORDER — ATORVASTATIN CALCIUM 40 MG PO TABS
40.0000 mg | ORAL_TABLET | Freq: Every day | ORAL | Status: DC
  Administered 2016-06-04 – 2016-06-05 (×2): 40 mg via ORAL
  Filled 2016-06-03 (×2): qty 1

## 2016-06-03 MED ORDER — ALBUTEROL SULFATE (2.5 MG/3ML) 0.083% IN NEBU: 2.5000 mg | INHALATION_SOLUTION | RESPIRATORY_TRACT | Status: DC | PRN

## 2016-06-03 MED ORDER — NITROGLYCERIN 0.4 MG SL SUBL
0.4000 mg | SUBLINGUAL_TABLET | Freq: Once | SUBLINGUAL | Status: AC
  Administered 2016-06-03: 0.4 mg via SUBLINGUAL

## 2016-06-03 NOTE — Consult Note (Signed)
Name: Jeffrey [name] MRN: [id number] DOB: 1965-05-28    ADMISSION DATE:  06/03/2016 CONSULTATION DATE:  3/7  REFERRING MD :  Dr. Tyrone Nine   CHIEF COMPLAINT:  Dyspnea   HISTORY OF PRESENT ILLNESS:   51 year old male with PMH of DVT (on Xarelto) and HTN. Presents to PCP on 3/7 with progressive dyspnea and left-sided chest pain that has been ongoing for 3-4 weeks. Sent to ED for further work-up. Patient reports that he is on xarelto for RLE DVT and has missed a few doses. Upon arrival to ED patient required 4L [place] and was hemodynamically stable. EKG revealed possible slope of the anterior ST segments with troponin of 0.04. Cardiology was consulted but did not feel as it was a STEMI. Further imaging with a CTA revealed bilateral PEs with right heart strain. PCCM was asked to consult.    SIGNIFICANT EVENTS  3/7 > Presents to ED SOB   STUDIES:  CXR 3/7 > New left mid-lung infiltrate, lungs are well aerated bilaterally, stable blunting of the right costophrenic angle is seen CTA Chest PE 3/7 > Positive acute PE with CT evidence of right heart strain (RV/LV ration 1.59) consistent with at least submassive PE ECHO 3/7 >> LE Dopplers 3/7 >>  PAST MEDICAL HISTORY :   has a past medical history of DVT (deep venous thrombosis) ([place]) (11/28/2013); Hypertension; and Marijuana use.  has a past surgical history that includes Knee surgery (Bilateral) and Hernia repair. Prior to Admission medications   Medication Sig Start Date End Date Taking? Authorizing Provider  albuterol (PROVENTIL HFA;VENTOLIN HFA) 108 (90 Base) MCG/ACT inhaler Inhale 2 puffs into the lungs every 6 (six) hours as needed for wheezing or shortness of breath. 05/26/16  Yes Ashly M Gottschalk, DO  amLODipine (NORVASC) 10 MG tablet Take 1 tablet (10 mg total) by mouth daily. 09/16/15  Yes Archie Patten, MD  atorvastatin (LIPITOR) 40 MG tablet Take 1 tablet (40 mg total) by mouth daily. 04/06/14  Yes Timmothy Euler, MD  meloxicam (MOBIC)  15 MG tablet Take 1 tablet (15 mg total) by mouth daily. 07/16/15  Yes Aquilla Hacker, MD  Menthol, Topical Analgesic, (ICY HOT EX) Apply 1 application topically every 4 (four) hours as needed (pain/swelling).   Yes Historical Provider, MD  oxyCODONE-acetaminophen (ROXICET) 5-325 MG per tablet Take 1 tablet by mouth every 8 (eight) hours as needed for severe pain. 04/06/14  Yes Timmothy Euler, MD  traMADol (ULTRAM) 50 MG tablet TAKE 1 TABLET BY MOUTH EVERY 6 HOURS AS NEEDED FOR BREAKTHROUGH PAIN 09/27/15  Yes Crystal Libby Maw, MD  XARELTO 20 MG TABS tablet TAKE 1 TABLET BY MOUTH DAILY WITH SUPPER. 05/26/16  Yes Archie Patten, MD  nicotine (NICODERM CQ - DOSED IN MG/24 HOURS) 14 mg/24hr patch Place 1 patch (14 mg total) onto the skin daily. Patient not taking: Reported on 04/06/2014 01/02/14   Sharon [address], MD   No Known Allergies  FAMILY HISTORY:  family history includes Alcohol abuse in his brother and mother; Arthritis in his father, mother, other, and other; Depression in his mother; Drug abuse in his brother; Heart disease in his mother, other, and other; Hypertension in his mother; Kidney disease in his mother; Sickle cell anemia in his brother. SOCIAL HISTORY:  reports that he has been smoking Cigarettes.  He has a 18.50 pack-year smoking history. He has never used smokeless tobacco. He reports that he drinks alcohol. He reports that he uses drugs, including  Marijuana and Cocaine.  REVIEW OF SYSTEMS:   All negative; except for those that are bolded, which indicate positives.  Constitutional: weight loss, weight gain, night sweats, fevers, chills, fatigue, weakness.  HEENT: headaches, sore throat, sneezing, nasal congestion, post nasal drip, difficulty swallowing, tooth/dental problems, visual complaints, visual changes, ear aches. Neuro: difficulty with speech, weakness, numbness, ataxia. CV:  chest pain, orthopnea, PND, swelling in lower extremities, dizziness, palpitations,  syncope.  Resp: cough, hemoptysis, dyspnea, wheezing. GI: heartburn, indigestion, abdominal pain, nausea, vomiting, diarrhea, constipation, change in bowel habits, loss of appetite, hematemesis, melena, hematochezia.  GU: dysuria, change in color of urine, urgency or frequency, flank pain, hematuria. MSK: joint pain or swelling, decreased range of motion. Psych: change in mood or affect, depression, anxiety, suicidal ideations, homicidal ideations. Skin: rash, itching, bruising.  SUBJECTIVE:  On nasal cannula. Patient reports dyspnea.   VITAL SIGNS: Pulse Rate:  [97-112] 97 (03/07 1030) Resp:  [19] 19 (03/07 1030) BP: (124-158)/(99-110) 124/99 (03/07 1015) SpO2:  [86 %-94 %] 91 % (03/07 1030) Weight:  [116.1 kg (256 lb)] 116.1 kg (256 lb) (03/07 1018)  PHYSICAL EXAMINATION: General: Adult male, no distress, lying in bed  Neuro:  Alert, oriented, follows commands  HEENT:  Normocephalic  Cardiovascular:  RRR, no MRG, NI S1/S2 Lungs:  No wheezes/crackles, non-labored  Abdomen: obese, non-tender, active bowel sounds  Musculoskeletal:  No acute  Skin:  Warm, dry, intact    Recent Labs Lab 06/03/16 1029  NA 136  K 4.1  CL 104  CO2 22  BUN 11  CREATININE 1.01  GLUCOSE 112*    Recent Labs Lab 06/03/16 1029  HGB 16.2  HCT 47.0  WBC 6.5  PLT 127*   Dg Chest 2 View  Result Date: 06/03/2016 CLINICAL DATA:  Chest pain for 2 weeks with shortness of breath EXAM: CHEST  2 VIEW COMPARISON:  05/26/2016 FINDINGS: Cardiac shadow is within normal limits. The lungs are well aerated bilaterally. Stable blunting of the right costophrenic angle is seen. There is patchy infiltrate noted in the left mid lung projecting in the left upper lobe on the lateral film. No bony abnormality is noted. IMPRESSION: New left midlung infiltrate. Followup PA and lateral chest X-ray is recommended in 3-4 weeks following trial of antibiotic therapy to ensure resolution and exclude underlying malignancy.  Electronically Signed   By: Inez Catalina M.D.   On: 06/03/2016 10:49   Ct Angio Chest Pe W And/or Wo Contrast  Result Date: 06/03/2016 CLINICAL DATA:  Frequent shortness of breath, history of DVT EXAM: CT ANGIOGRAPHY CHEST WITH CONTRAST TECHNIQUE: Multidetector CT imaging of the chest was performed using the standard protocol during bolus administration of intravenous contrast. Multiplanar CT image reconstructions and MIPs were obtained to evaluate the vascular anatomy. CONTRAST:  100 mL Isovue 370 IV COMPARISON:  Chest radiographs dated 06/03/2016 FINDINGS: Cardiovascular: Satisfactory opacification of the pulmonary artery is to the segmental level. Positive for pulmonary embolism in the distal right main pulmonary artery (series 7/ image 148), extending into the right upper, middle, and lower lobes. Positive for pulmonary embolism in the distal left main pulmonary artery (series 7/ image 130), extending into the left upper and lower lobes. Overall clot burden is moderate to large. Elevated RV to LV ratio of 1.59, suggesting right heart strain. Mediastinum/Nodes: No suspicious mediastinal lymphadenopathy. Visualized thyroid is unremarkable. Lungs/Pleura: Segmental/ patchy ground-glass opacity in the left upper lobe (series 6/ image 63), possibly reflecting pneumonia, although pulmonary infarction is favored. Right lung is clear.  No pleural effusion or pneumothorax. Upper Abdomen: Visualized upper abdomen is grossly unremarkable. Musculoskeletal: Degenerative changes of the visualized thoracolumbar spine. Review of the MIP images confirms the above findings. IMPRESSION: Pulmonary embolism in the bilateral distal main pulmonary arteries, extending into all lobes. Overall clot burden is moderate to large. Findings worrisome for right heart strain. Segmental/patchy ground-glass opacity in the left upper lobe, favoring pulmonary infarct, less likely pneumonia. Positive for acute PE with CT evidence of right heart  strain (RV/LV Ratio = 1.59) consistent with at least submassive (intermediate risk) PE. The presence of right heart strain has been associated with an increased risk of morbidity and mortality. Please activate Code PE by paging [phone]. Critical Value/emergent results were called by telephone at the time of interpretation on 06/03/2016 at 12:12 pm to Dr. Tyrone Nine, who verbally acknowledged these results. Electronically Signed   By: Julian Hy M.D.   On: 06/03/2016 12:17    ASSESSMENT / PLAN:  Acute Hypoxic Respiratory Failure secondary to Bilateral PE with right heart strain Plan  -At this time EKOS is not required as patient is hemodynamically stable on minimal oxygen requirements  -Continue Heparin GTT  -Hold home Xarelto  -Trend APTT per pharmacy  -BLE Dopplers  -ECHO pending -Send BNP  -Trend Trop  -Maintain Saturation >92    Hayden Pedro, AG-ACNP [facility]  Pgr: [phone]  PCCM Pgr: [phone]    ATTENDING NOTE / ATTESTATION NOTE :   I have discussed the case with the resident/APP  Hayden Pedro NP.   I agree with the resident/APP's  history, physical examination, assessment, and plans.    I have edited the above note and modified it according to our agreed history, physical examination, assessment and plan.   Briefly, pt with 30 PY smoking history, quit several days ago admitted for 3 week h/o SOB with exertion.  Pt was diagnosed with R DVT in 2017, likely provoked.  He has been on Eliquis since that time but he is not compliant with meds. He forgets to take his meds.  He noticed gradual exertional dyspnea x 3 weeks.  He saw PCP and CXR was normal and was sent to ED.  Chest CTA showed significant filling defects in R PA and L PA.  VSS.  Comfortable. NAD. Denies fever, chills.  Some dry cough. Occasional pleuritic cp, better with rest.  He feels "tight" with exertion.   Vitals:  Vitals:   06/03/16 1015 06/03/16 1018 06/03/16 1030  06/03/16 1410  BP: 124/99   145/97  Pulse:   97 82  Resp:   19 24  SpO2:   91% 97%  Weight:  116.1 kg (256 lb)    Height:  6\' 3"  (1.905 m)      Constitutional/General: well-nourished, well-developed,  not in any distress. Comfortable on [place].   Body mass index is 32 kg/m. Wt Readings from Last 3 Encounters:  06/03/16 116.1 kg (256 lb)  06/03/16 116.1 kg (256 lb)  05/26/16 116.1 kg (256 lb)    HEENT: PERLA, anicteric sclerae. (-) Oral thrush.   Neck: No masses. Midline trachea. No JVD, (-) LAD. (-) bruits appreciated.  Respiratory/Chest: Grossly normal chest. (-) deformity. (-) Accessory muscle use.  Symmetric expansion. Diminished BS on both lower lung zones. (-) wheezing, crackles, rhonchi (-) egophony  Cardiovascular: Regular rate and  rhythm, heart sounds normal, no murmur or gallops, (-) s3/ (-) RV heave.  Trace peripheral edema  Gastrointestinal:  Normal bowel sounds. Soft, non-tender. No  hepatosplenomegaly.  (-) masses.   Musculoskeletal:  Normal muscle tone.   Extremities: Grossly normal. (-) clubbing, cyanosis.  (-) edema  Skin: (-) rash,lesions seen.   Neurological/Psychiatric :  CN grossly intact. (-) lateralizing signs.     CBC Recent Labs     06/03/16  1029  WBC  6.5  HGB  16.2  HCT  47.0  PLT  127*    Coag's Recent Labs     06/03/16  1220  06/03/16  1301  APTT   --   36  INR  1.50   --     BMET Recent Labs     06/03/16  1029  NA  136  K  4.1  CL  104  CO2  22  BUN  11  CREATININE  1.01  GLUCOSE  112*    Electrolytes Recent Labs     06/03/16  1029  CALCIUM  8.8*    Sepsis Markers No results for input(s): PROCALCITON, O2SATVEN in the last 72 hours.  Invalid input(s): LACTICACIDVEN  ABG No results for input(s): PHART, PCO2ART, PO2ART in the last 72 hours.  Liver Enzymes No results for input(s): AST, ALT, ALKPHOS, BILITOT, ALBUMIN in the last 72 hours.  Cardiac Enzymes No results for input(s): TROPONINI, PROBNP in  the last 72 hours.  Glucose No results for input(s): GLUCAP in the last 72 hours.  Imaging Dg Chest 2 View  Result Date: 06/03/2016 CLINICAL DATA:  Chest pain for 2 weeks with shortness of breath EXAM: CHEST  2 VIEW COMPARISON:  05/26/2016 FINDINGS: Cardiac shadow is within normal limits. The lungs are well aerated bilaterally. Stable blunting of the right costophrenic angle is seen. There is patchy infiltrate noted in the left mid lung projecting in the left upper lobe on the lateral film. No bony abnormality is noted. IMPRESSION: New left midlung infiltrate. Followup PA and lateral chest X-ray is recommended in 3-4 weeks following trial of antibiotic therapy to ensure resolution and exclude underlying malignancy. Electronically Signed   By: Inez Catalina M.D.   On: 06/03/2016 10:49   Ct Angio Chest Pe W And/or Wo Contrast  Result Date: 06/03/2016 CLINICAL DATA:  Frequent shortness of breath, history of DVT EXAM: CT ANGIOGRAPHY CHEST WITH CONTRAST TECHNIQUE: Multidetector CT imaging of the chest was performed using the standard protocol during bolus administration of intravenous contrast. Multiplanar CT image reconstructions and MIPs were obtained to evaluate the vascular anatomy. CONTRAST:  100 mL Isovue 370 IV COMPARISON:  Chest radiographs dated 06/03/2016 FINDINGS: Cardiovascular: Satisfactory opacification of the pulmonary artery is to the segmental level. Positive for pulmonary embolism in the distal right main pulmonary artery (series 7/ image 148), extending into the right upper, middle, and lower lobes. Positive for pulmonary embolism in the distal left main pulmonary artery (series 7/ image 130), extending into the left upper and lower lobes. Overall clot burden is moderate to large. Elevated RV to LV ratio of 1.59, suggesting right heart strain. Mediastinum/Nodes: No suspicious mediastinal lymphadenopathy. Visualized thyroid is unremarkable. Lungs/Pleura: Segmental/ patchy ground-glass opacity  in the left upper lobe (series 6/ image 63), possibly reflecting pneumonia, although pulmonary infarction is favored. Right lung is clear. No pleural effusion or pneumothorax. Upper Abdomen: Visualized upper abdomen is grossly unremarkable. Musculoskeletal: Degenerative changes of the visualized thoracolumbar spine. Review of the MIP images confirms the above findings. IMPRESSION: Pulmonary embolism in the bilateral distal main pulmonary arteries, extending into all lobes. Overall clot burden is moderate to large. Findings worrisome for right  heart strain. Segmental/patchy ground-glass opacity in the left upper lobe, favoring pulmonary infarct, less likely pneumonia. Positive for acute PE with CT evidence of right heart strain (RV/LV Ratio = 1.59) consistent with at least submassive (intermediate risk) PE. The presence of right heart strain has been associated with an increased risk of morbidity and mortality. Please activate Code PE by paging [phone]. Critical Value/emergent results were called by telephone at the time of interpretation on 06/03/2016 at 12:12 pm to Dr. Tyrone Nine, who verbally acknowledged these results. Electronically Signed   By: Julian Hy M.D.   On: 06/03/2016 12:17   Assessment/Plan: VTE, Right Pulmonary Artery as well as Left Pulmonary Artery. I suspect this is acute on chronic pulmonary embolism given patient's noncompliance with medicines. By history, the VTE was unprovoked.  - As he is hemodynamically stable, continue with heparin drip. Transition to by mouth [place] in next 24-48 hrs - I will hold off on EKOS for now.  - I stressed importance of taking his blood thinners indefinitely with the patient. - I suspect he will be on anticoagulation chronically. - He will need a repeat chest CT scan in 6- 8 weeks just to make sure blood clots are stable. The concern is chronic pulmonary embolism with CTEPH associated with this.  He needs close follow-up with pulmonary. If he will have  chronic PE  or CTEPH, then that will need evaluation as there are surgical and medical options for CTEPH - Trend troponin.  - Check echo - Check BNP  LUL infiltrate, favor pulmonary infarct. Doubt PNA - observe off abx for now - Keep o2 sats > 90% - Incentive spirometry  Recent exertional dyspnea likely 2/2 above. Possible undiagnosed COPD given smoking history - will need PFts as outpt - I wanted to do therapeutic trial with MDIs (Symbicort or Breo) but he wanted to hold off.   Tobacco abuse - smoking cessation done.  He has been abstient x 4 days.   No family at bedside. Plan d/w pt. Wife works for [employer].   PCCM will sign off for now.  Pls call back if with issues. Thanks!    J. Shirl Harris, MD 06/03/2016, 2:19 PM [facility] Pager [phone] After 3 pm or if no answer, call [phone]

## 2016-06-03 NOTE — ED Notes (Signed)
pts tray ordered

## 2016-06-03 NOTE — ED Notes (Signed)
Pt given sandwich bag and water per Dr. Tyrone Nine

## 2016-06-03 NOTE — ED Triage Notes (Addendum)
Pt in from [facility] via [facility], pt seen for regular check up, pt reports intermittent L sided non radiating CP onset x 2 wks with SOB, pt reports nausea, pt rcvd 324 mg [place], x 2 sL nitro, pt has hx of DVT last year, takes Xerelto, SOB worse with exertion, A&O x4, pt had chest xray pta

## 2016-06-03 NOTE — ED Notes (Signed)
Pt's clothes & belongings in pt belongings bag, at bedside.

## 2016-06-03 NOTE — Progress Notes (Signed)
   Patient in clinic with morning for follow up visit.  Patient informed front office he was having chest pain and SOB.  Patient stated chest pain x 1 week and SOB x a couple of weeks.  Chest pain located left side under breast; 4/10 pain sharp pain.  Patient stated laying flat helps with some relief.  Vital at 9:14 AM BP 158/110 Right arm manually, HR 112, 86% RA.  Patient placed on 3 liters of oxygen stats increased to 993-94%.  Nitro 0.4 mg given at 9:29 AM and 325 mg of Aspirin given.  Pt reported taken blood pressure medication at 3 AM.  Repeat BP 152/102, HR 107 and 94% RA at 9:33 AM.  Latina Craver, BSN, RN 3-BC

## 2016-06-03 NOTE — Progress Notes (Signed)
CRITICAL VALUE ALERT  Critical value received:  Troponin 0.03  Date of notification:  06/03/16  Time of notification:  1959  Nurse who received alert:  Gevena Cotton  MD notified (1st page):  MD. Juleen China  Time of first page:  1959

## 2016-06-03 NOTE — Progress Notes (Signed)
ANTICOAGULATION CONSULT NOTE - Follow Up Consult  Pharmacy Consult for heparin Indication: pulmonary embolus  No Known Allergies  Patient Measurements: Height: 6\' 3"  (190.5 cm) Weight: 256 lb (116.1 kg) IBW/kg (Calculated) : 84.5 Heparin Dosing Weight: 108.8  Vital Signs: Temp: 99.1 F (37.3 C) (03/07 1951) Temp Source: Oral (03/07 1951) BP: 153/114 (03/07 1951) Pulse Rate: 88 (03/07 1951)  Labs:  Recent Labs  06/03/16 1029 06/03/16 1220 06/03/16 1301 06/03/16 1915  HGB 16.2  --   --   --   HCT 47.0  --   --   --   PLT 127*  --   --   --   APTT  --   --  36 69*  LABPROT  --  18.2*  --   --   INR  --  1.50  --   --   HEPARINUNFRC  --   --  >2.20*  --   CREATININE 1.01  --   --   --   TROPONINI  --   --   --  0.03*   Estimated Creatinine Clearance: 117.5 mL/min (by C-G formula based on SCr of 1.01 mg/dL).  Medications:  Prescriptions Prior to Admission  Medication Sig Dispense Refill Last Dose  . albuterol (PROVENTIL HFA;VENTOLIN HFA) 108 (90 Base) MCG/ACT inhaler Inhale 2 puffs into the lungs every 6 (six) hours as needed for wheezing or shortness of breath. 1 Inhaler 0 rescue at rescue  . amLODipine (NORVASC) 10 MG tablet Take 1 tablet (10 mg total) by mouth daily. 90 tablet 3 06/03/2016 at Unknown time  . atorvastatin (LIPITOR) 40 MG tablet Take 1 tablet (40 mg total) by mouth daily. 90 tablet 3 06/03/2016 at Unknown time  . meloxicam (MOBIC) 15 MG tablet Take 1 tablet (15 mg total) by mouth daily. 40 tablet 1 06/03/2016 at Unknown time  . Menthol, Topical Analgesic, (ICY HOT EX) Apply 1 application topically every 4 (four) hours as needed (pain/swelling).   Past Week at Unknown time  . oxyCODONE-acetaminophen (ROXICET) 5-325 MG per tablet Take 1 tablet by mouth every 8 (eight) hours as needed for severe pain. 90 tablet 0 Past Week at Unknown time  . traMADol (ULTRAM) 50 MG tablet TAKE 1 TABLET BY MOUTH EVERY 6 HOURS AS NEEDED FOR BREAKTHROUGH PAIN 30 tablet 0 Past Week at  Unknown time  . XARELTO 20 MG TABS tablet TAKE 1 TABLET BY MOUTH DAILY WITH SUPPER. 30 tablet 1 06/02/2016 at 1800  . nicotine (NICODERM CQ - DOSED IN MG/24 HOURS) 14 mg/24hr patch Place 1 patch (14 mg total) onto the skin daily. (Patient not taking: Reported on 04/06/2014) 28 patch 0 Not Taking at Unknown time   Assessment: 40 yom with worsening SOB, CP. History of DVT on Xarelto PTA (reports missing a "few doses here and there" per MD note). Pharmacy consulted to start heparin for PE with RHS (RV/LV ratio 1.59), moderate to large clot burden. Hg wnl, plt 127 on admit. No bleed documented. Will increase heparin infusion rate slightly given low end of therapeutic range. No bleeding or infusion related issues reported.  aPTT therapeutic: 69   Goal of Therapy:  Heparin level 0.3-0.7 units/ml aPTT 66-102 seconds Monitor platelets by anticoagulation protocol: Yes   Plan:  Increase heparin gtt to 2000 units/hr Daily aPTT/heparin level/CBC Monitor s/sx bleeding Xarelto on hold  Georga Bora, PharmD Clinical Pharmacist Pager: [phone] 06/03/2016 9:03 PM

## 2016-06-03 NOTE — ED Notes (Signed)
Pt eating, tolerating well, resting on the phone talking with family

## 2016-06-03 NOTE — Progress Notes (Addendum)
ANTICOAGULATION CONSULT NOTE  Pharmacy Consult for heparin Indication: pulmonary embolus  Heparin Dosing Weight: 108.8 kg   Assessment: 63 yom with worsening SOB, CP. History of DVT on Xarelto PTA (reports missing a "few doses here and there" per MD note). Pharmacy consulted to start heparin for PE with RHS (RV/LV ratio 1.59), moderate to large clot burden. Hg wnl, plt 127 on admit. No bleed documented.  Last dose of Xarelto reported on 3/6 at 1800 per med rec (Fam Med note says 3/7 AM?) - ok to start <24h after last dose with acute PE.  Goal of Therapy:  Heparin level 0.3-0.7 units/ml aPTT 66-102 seconds Monitor platelets by anticoagulation protocol: Yes   Plan:  Baseline aPTT/heparin level Start heparin at 1900 units/h (no bolus with recent Xarelto) 6h aPTT Daily aPTT/heparin level/CBC Monitor s/sx bleeding Xarelto on hold   Elicia Lamp, PharmD, BCPS Clinical Pharmacist 06/03/2016 12:28 PM

## 2016-06-03 NOTE — H&P (Signed)
[facility] Admission History and Physical Service Pager: [phone]  [name] Medical record number: [id number] Date of birth: [birth year] Age: 51 y.o. Gender: male  Primary Care Provider: Kathrine Cords, MD Consultants: Cardiology, CCM  Code Status: FULL  Chief Complaint: exertional chest pain with shortness of breath  Assessment and Plan: Jeffrey [name] is a 51 y.o. 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 PMH: DVTx2 on chronic anticoagulation, HTN, tobacco use.   Submassive bilateral pulmonary embolism with right heart strain: History of unprovoked DVTs x2 and recently missed doses of xarelto x4 in the past month.  CTA chest noted PE in bilateral distal main pulmonary arteries extending into all lobes. RV/LV ratio 1.59, consistent for right heart strain and at least submassive PE.  Started on heparin drip per pharmacy. Satting 94%RA on 2LNC, otherwise VSS. Additionally, had inferior ischemia and and possible slope of anterior ST segments with troponin to 0.04. ED provider discussed with Cardiologist, Dr. Julianne Handler who felt this was not STEMI. They were consulted to see patient. Critical care also consulted, who feel patient is stable for stepdown unit and recommended against TPA at this time.  - admit to SDU with telemetry under attending Mingo Amber - heparin drip per pharmacy; will restart DOAC after 24hrs; will need to make decision about resumption of Xarelto vs. Initiation of another DOAC as recurrent VTE related to non-adherence vs. Failure of medical therapy  - vital signs per floor protocol - O2 via [place]; titrate sats >92% - trend troponins - Echo - Cardiology consulted; awaiting recs - CCM consulted; appreciate recommendations - repeat EKG to evaluate for evolving strain/ischemia - nitroglycerine  0.4mg  q66min PRN chest pain - incentive spirometry - BNP - repeat CT chest 6-8 weeks to assess stability of clots -may benefit from hypercoagulable work up after d/c if this has not been completed in the past   Chest pain: Exertional with associated SOB.  Likely related to pleuritic CP 2/2 submassive PE. TTP on left chest wall and worsening with cough. Negative troponins. EKG did show some findings concerning for STEMI, but discussed with Cardiologist who felt not STEMI.  -STAT repeat of EKG; if shows progression concerning for STEMI will contact cardiology  - continue heparin drip - nitro PRN chest pain - AM EKG - BNP - Cardiologist consult - trend troponins  LUL Infiltrate: Patchy ground glass opacity in the left upper lobe visualized on CTA: pulmonary infarct vs. Less likely PNA. Patient is afebrile and without leukocytosis so doubt PNA. Imaging reviewed by CCM who favors pulmonary infarct as well. CXR obtained prior showed LML/LUL infiltrate read as PNA vs. Underlying malignancy (given smoking history).  -hold off on abx therapy for now and watch clinical status -plan for repeat CT chest in 6-8 weeks and could likely f/u infiltrate at that time   Hypertension: Takes amlodipine 10mg  daily. BP stable upon presentation.   - continue amlodipine 10mg  daily  Chronic pain: History of bilateral knee and lumbar pain. Takes tramadol 50mg  q6hrs and meloxicam 15mg  daily.  - continue tramadol - holding meloxicam due to potential of ACS   Tobacco Use: Patient admits to smoking but has been attempting to quit.  -Nicotine 7 mg patch ordered   FEN/GI: Heart healthy diet Prophylaxis: Heparin  Disposition: admit to SDU with telemetry under attending Mingo Amber. Will  continue on heparin drip and add DOAC in 24hrs.   History of Present Illness:  Jeffrey [name] is a 51 y.o. male presenting with chest pain x 1 week and SOB x2 weeks along with exertion.  Had significant CP today and went [facility].  Notes that CP is on left side and is described as sharp and squeezing with tightness with ambulation. Feels more comfortable with lying back. Has history of DVTs and is supposed to be on chronic anticoagulation. No cancer history. Takes Xarelto 20 mg daily. Missed 3-4 doses the month prior. Last took Xarelto this morning. Cough since SOB started. No hemoptysis. CP does not radiate to arm, neck, or jaw. No diaphoresis.   Review Of Systems: Per HPI with the following additions: No nausea, vomiting, dysuria. +Chills. Productive cough. No fever.   ROS  Patient Active Problem List   Diagnosis Date Noted  . Degenerative arthritis of right knee 08/08/2015  . Encounter for chronic pain management 04/06/2014  . Tobacco abuse 12/05/2013  . DVT (deep venous thrombosis) ([place]) 11/28/2013  . Candidate for statin therapy due to risk of future cardiovascular event 05/05/2013  . HTN (hypertension) 05/04/2013  . Bilateral knee pain 05/04/2013  . Lumbar back pain 05/04/2013  . Healthcare maintenance 05/04/2013  . BMI 33.0-33.9,adult 05/04/2013  . Hx of substance abuse 05/04/2013    Past Medical History: Past Medical History:  Diagnosis Date  . DVT (deep venous thrombosis) ([place]) 11/28/2013   RT LEG  . Hypertension   . Marijuana use     Past Surgical History: Past Surgical History:  Procedure Laterality Date  . HERNIA REPAIR     2005 and 2011  . KNEE SURGERY Bilateral    Left knee 1993, right knee 2004    Social History: Social History  Substance Use Topics  . Smoking status: Current Every Day Smoker    Packs/day: 0.50    Years: 37.00    Types: Cigarettes  . Smokeless tobacco: Never Used  . Alcohol use Yes     Comment: 1-2 drinks per event, few times per week   Additional social history: Still active smoker but trying to cut back. Occasional THC use and beer use.   Please also refer to relevant sections of EMR.  Family History: Family  History  Problem Relation Age of Onset  . Heart disease Other   . Arthritis Other   . Heart disease Other   . Arthritis Other   . Alcohol abuse Mother   . Heart disease Mother   . Depression Mother   . Hypertension Mother   . Kidney disease Mother   . Arthritis Mother   . Arthritis Father   . Alcohol abuse Brother   . Drug abuse Brother   . Sickle cell anemia Brother     Allergies and Medications: No Known Allergies No current facility-administered medications on file prior to encounter.    Current Outpatient Prescriptions on File Prior to Encounter  Medication Sig Dispense Refill  . albuterol (PROVENTIL HFA;VENTOLIN HFA) 108 (90 Base) MCG/ACT inhaler Inhale 2 puffs into the lungs every 6 (six) hours as needed for wheezing or shortness of breath. 1 Inhaler 0  . amLODipine (NORVASC) 10 MG tablet Take 1 tablet (10 mg total) by mouth daily. 90 tablet 3  . atorvastatin (LIPITOR) 40 MG tablet Take 1 tablet (40 mg total) by mouth daily. 90 tablet 3  . meloxicam (MOBIC) 15 MG tablet Take 1  tablet (15 mg total) by mouth daily. 40 tablet 1  . Menthol, Topical Analgesic, (ICY HOT EX) Apply 1 application topically every 4 (four) hours as needed (pain/swelling).    Marland Kitchen oxyCODONE-acetaminophen (ROXICET) 5-325 MG per tablet Take 1 tablet by mouth every 8 (eight) hours as needed for severe pain. 90 tablet 0  . traMADol (ULTRAM) 50 MG tablet TAKE 1 TABLET BY MOUTH EVERY 6 HOURS AS NEEDED FOR BREAKTHROUGH PAIN 30 tablet 0  . XARELTO 20 MG TABS tablet TAKE 1 TABLET BY MOUTH DAILY WITH SUPPER. 30 tablet 1  . nicotine (NICODERM CQ - DOSED IN MG/24 HOURS) 14 mg/24hr patch Place 1 patch (14 mg total) onto the skin daily. (Patient not taking: Reported on 04/06/2014) 28 patch 0    Objective: BP 124/99   Pulse 97   Resp 19   Ht 6\' 3"  (1.905 m)   Wt 256 lb (116.1 kg)   SpO2 91%   BMI 32.00 kg/m  Exam: General: 51 year old male sitting up in bed appearing uncomfortable but in no acute distress Eyes:  EOMI, PERRL, non-injected and anicteric ENTM: No nasal drainage, clear oropharynx, MMM Neck: Supple, no LAD Cardiovascular: RRR, no MRG, 2+ distal and radial pulses, negative Homan's sign bilaterally, no calf TTP or palpable cords  Respiratory: no increased WOB, CTABL, no wheezing or rhonchi, [place] in place  Gastrointestinal: soft, NTND, no palpable masses, +BS MSK: no gross deformities, no edema, FROM, left chest wall mod TTP Derm: warm and dry, no new rashes, no erythema or color changes to LE  Neuro: CN2-12 WNL, no loss of sensation, 5/5 strength upper and lower extremities, AAOx3 Psych: Normal mood and affect  Labs and Imaging: CBC BMET   Recent Labs Lab 06/03/16 1029  WBC 6.5  HGB 16.2  HCT 47.0  PLT 127*    Recent Labs Lab 06/03/16 1029  NA 136  K 4.1  CL 104  CO2 22  BUN 11  CREATININE 1.01  GLUCOSE 112*  CALCIUM 8.8*       Eloise Levels, MD 06/03/2016, 12:51 PM PGY-1, [place] [facility] pager: [phone], text pages welcome  Upper Level Addendum:  I have seen and evaluated this patient along with Dr. Rosalyn Gess and reviewed the above note, making necessary revisions in green.   Phill Myron, D.O. 06/03/2016, 4:11 PM PGY-2, [place]

## 2016-06-03 NOTE — Progress Notes (Signed)
   Subjective: CC: CP/SOB [id number] Jeffrey [name] is a 51 y.o. male presenting to clinic today for same day appointment. PCP: Kathrine Cords, MD Concerns today include:  Patient reports that SOB has worsened since last visit.  He reports DOE with simple tasks like going to the kitchen.  He reports left sided CP associated with activity.  He notes that CP has been ongoing for 3-4 weeks.  He notes that CP has been intermittent during the last month. He considered going to ED yesterday but wanted to wait until today'Jeffrey appt.  He denies nausea, vomiting, dizziness, diaphoresis, abdominal pain.  He denies LE swelling.  He reports chronic RLE size compared to LLE because he has a history of DVT for which he is on Xarelto.  He reports missing a couple of doses here and there of his anticoagulant.  He reports having taken all of his medications today.  No Known Allergies  Social Hx reviewed: active 3 packs per week smoker. MedHx, current medications and allergies reviewed.  Please see EMR. ROS: Per HPI  Objective: Office vital signs reviewed. BP (!) 152/102 (BP Location: Left Arm, Patient Position: Supine, Cuff Size: Normal)   Pulse (!) 107   Ht 6' 3.5" (1.918 m)   Wt 256 lb (116.1 kg)   SpO2 94%   BMI 31.58 kg/m   Physical Examination:  General: Awake, alert, well nourished, no diaphoresis. No acute distress HEENT: Normal, sclera slightly icteric Cardio: regular rate and rhythm, S1S2 heard, no murmurs appreciated Pulm: breathing normally on 3L O2 via [place] Extremities: warm, well perfused, No edema, cyanosis or clubbing; +2 pulses bilaterally, no TTP to calf, no erythema.  Slight increased girth of RLE compared to LLE. Neuro: no focal deficits Psych: mood stable, speech normal  Assessment/ Plan: 51 y.o. male   1. Chest pain, unspecified type.  Duration somewhat atypical but symptoms seem typical.  EKG with ST changes in V2-4.  Discussed concern and need for transfer to ED for further  evaluation/ Cards consultation.  He has received ASA 325mg  and 1 dose of sublingual nitroglycerin.  Last CXR reviewed which showed a small right sided pleural effusion but no infiltrates consistent with pna.  He is afebrile here.  Also to be considered is PE.  Patient intermittently tachycardic in office.  He has a new O2 requirement.  He has missed a couple of doses of Xarelto and has a h/o DVT.  Also, patient is a long time smoker.  CXR with small right sided pleural effusion.  Given his smoking history, cannot r/o malignancy.   - Recommend CXR, Troponin, CMET, CBC, DDimer - Likely will need admission for ACS evaluation - EKG 12-Lead - Patient stable for transfer to ED via [place], DO PGY-3, [place] [facility]

## 2016-06-03 NOTE — ED Provider Notes (Signed)
[facility] DEPT Provider Note   CSN: [id number] Arrival date & time: 06/03/16  1009     History   Chief Complaint No chief complaint on file.   HPI Jeffrey [name] is a 51 y.o. male.  HPI  51 y.o. male with a hx of DVT on Xarelto, HTN, presents to the Emergency Department today via PCP office. Complaining of shortness of breath x 1-2 weeks. Worsening today. Notes dyspnea with exertion with left sided chest pain. Palpable to touch. Notes ongoing pain for 3-4 weeks that is intermittent. No N/V. No diaphoresis. No abdominal pain. Pt notes hx of DVT, but denies leg swelling currently. Pt endorses missing a few doses, but otherwise compliant. At office visit, pt with EKG with ST changes V2-V4. Given ASA and 1 Nitro with moderate relif. Noted new O2 requirement with O2 Sat 94%. PCP concern for ACS vs PE. Recommended admission for ACS eval.   Past Medical History:  Diagnosis Date  . DVT (deep venous thrombosis) ([place]) 11/28/2013   RT LEG  . Hypertension   . Marijuana use     Patient Active Problem List   Diagnosis Date Noted  . Degenerative arthritis of right knee 08/08/2015  . Encounter for chronic pain management 04/06/2014  . Tobacco abuse 12/05/2013  . DVT (deep venous thrombosis) ([place]) 11/28/2013  . Candidate for statin therapy due to risk of future cardiovascular event 05/05/2013  . HTN (hypertension) 05/04/2013  . Bilateral knee pain 05/04/2013  . Lumbar back pain 05/04/2013  . Healthcare maintenance 05/04/2013  . BMI 33.0-33.9,adult 05/04/2013  . Hx of substance abuse 05/04/2013    Past Surgical History:  Procedure Laterality Date  . HERNIA REPAIR     2005 and 2011  . KNEE SURGERY Bilateral    Left knee 1993, right knee 2004       Home Medications    Prior to Admission medications   Medication Sig Start Date End Date Taking? Authorizing Provider  albuterol (PROVENTIL HFA;VENTOLIN HFA) 108 (90 Base) MCG/ACT inhaler Inhale 2 puffs into the lungs every 6 (six)  hours as needed for wheezing or shortness of breath. 05/26/16   Ashly Windell Moulding, DO  amLODipine (NORVASC) 10 MG tablet Take 1 tablet (10 mg total) by mouth daily. 09/16/15   Archie Patten, MD  atorvastatin (LIPITOR) 40 MG tablet Take 1 tablet (40 mg total) by mouth daily. 04/06/14   Timmothy Euler, MD  meloxicam (MOBIC) 15 MG tablet Take 1 tablet (15 mg total) by mouth daily. 07/16/15   York Ram Melancon, MD  Menthol, Topical Analgesic, (ICY HOT EX) Apply 1 application topically every 4 (four) hours as needed (pain/swelling).    Historical Provider, MD  nicotine (NICODERM CQ - DOSED IN MG/24 HOURS) 14 mg/24hr patch Place 1 patch (14 mg total) onto the skin daily. Patient not taking: Reported on 04/06/2014 01/02/14   Sharon [address], MD  oxyCODONE-acetaminophen (ROXICET) 5-325 MG per tablet Take 1 tablet by mouth every 8 (eight) hours as needed for severe pain. 04/06/14   Timmothy Euler, MD  traMADol (ULTRAM) 50 MG tablet TAKE 1 TABLET BY MOUTH EVERY 6 HOURS AS NEEDED FOR BREAKTHROUGH PAIN 09/27/15   Archie Patten, MD  XARELTO 20 MG TABS tablet TAKE 1 TABLET BY MOUTH DAILY WITH SUPPER. 05/26/16   Archie Patten, MD    Family History Family History  Problem Relation Age of Onset  . Alcohol abuse Mother   . Heart disease Mother   . Depression  Mother   . Hypertension Mother   . Kidney disease Mother   . Arthritis Mother   . Arthritis Father   . Alcohol abuse Brother   . Drug abuse Brother   . Sickle cell anemia Brother   . Heart disease Other   . Arthritis Other   . Heart disease Other   . Arthritis Other     Social History Social History  Substance Use Topics  . Smoking status: Current Every Day Smoker    Packs/day: 0.50    Years: 37.00    Types: Cigarettes  . Smokeless tobacco: Never Used  . Alcohol use Yes     Comment: 1-2 drinks per event, few times per week     Allergies   Patient has no known allergies.   Review of Systems Review of Systems ROS reviewed  and all are negative for acute change except as noted in the HPI.  Physical Exam Updated Vital Signs BP 124/99   Pulse 97   Resp 19   Ht 6\' 3"  (1.905 m)   Wt 116.1 kg   SpO2 91%   BMI 32.00 kg/m   Physical Exam  Constitutional: He is oriented to person, place, and time. Vital signs are normal. He appears well-developed and well-nourished. No distress.  HENT:  Head: Normocephalic and atraumatic.  Right Ear: Hearing, tympanic membrane, external ear and ear canal normal.  Left Ear: Hearing, tympanic membrane, external ear and ear canal normal.  Nose: Nose normal.  Mouth/Throat: Uvula is midline, oropharynx is clear and moist and mucous membranes are normal. No trismus in the jaw. No oropharyngeal exudate, posterior oropharyngeal erythema or tonsillar abscesses.  Eyes: Conjunctivae and EOM are normal. Pupils are equal, round, and reactive to light.  Neck: Normal range of motion. Neck supple. No tracheal deviation present.  Cardiovascular: Normal rate, regular rhythm, S1 normal, S2 normal, normal heart sounds, intact distal pulses and normal pulses.   Pulmonary/Chest: Effort normal and breath sounds normal. No respiratory distress. He has no decreased breath sounds. He has no wheezes. He has no rhonchi. He has no rales.    Appears to be working for breath. NAD. Left anterior chest wall TTP   Abdominal: Soft. Normal appearance and bowel sounds are normal. There is no tenderness.  Musculoskeletal: Normal range of motion.  Neurological: He is alert and oriented to person, place, and time.  Skin: Skin is warm and dry.  Psychiatric: He has a normal mood and affect. His speech is normal and behavior is normal. Thought content normal.  Nursing note and vitals reviewed.  ED Treatments / Results  Labs (all labs ordered are listed, but only abnormal results are displayed) Labs Reviewed  CBC - Abnormal; Notable for the following:       Result Value   Platelets 127 (*)    All other  components within normal limits  BASIC METABOLIC PANEL - Abnormal; Notable for the following:    Glucose, Bld 112 (*)    Calcium 8.8 (*)    All other components within normal limits  PROTIME-INR  BRAIN NATRIURETIC PEPTIDE  I-STAT TROPOININ, ED  I-STAT CG4 LACTIC ACID, ED  I-STAT TROPOININ, ED    EKG  EKG Interpretation  Date/Time:  Wednesday June 03 2016 10:16:39 EST Ventricular Rate:  98 PR Interval:    QRS Duration: 97 QT Interval:  386 QTC Calculation: 493 R Axis:   97 Text Interpretation:  Sinus rhythm Borderline right axis deviation Abnormal T, consider ischemia, anterior leads No  old tracing to compare Confirmed by [facility] MD, [place] [phone]) on 06/03/2016 12:13:58 PM       Radiology Dg Chest 2 View  Result Date: 06/03/2016 CLINICAL DATA:  Chest pain for 2 weeks with shortness of breath EXAM: CHEST  2 VIEW COMPARISON:  05/26/2016 FINDINGS: Cardiac shadow is within normal limits. The lungs are well aerated bilaterally. Stable blunting of the right costophrenic angle is seen. There is patchy infiltrate noted in the left mid lung projecting in the left upper lobe on the lateral film. No bony abnormality is noted. IMPRESSION: New left midlung infiltrate. Followup PA and lateral chest X-ray is recommended in 3-4 weeks following trial of antibiotic therapy to ensure resolution and exclude underlying malignancy. Electronically Signed   By: Inez Catalina M.D.   On: 06/03/2016 10:49   Ct Angio Chest Pe W And/or Wo Contrast  Result Date: 06/03/2016 CLINICAL DATA:  Frequent shortness of breath, history of DVT EXAM: CT ANGIOGRAPHY CHEST WITH CONTRAST TECHNIQUE: Multidetector CT imaging of the chest was performed using the standard protocol during bolus administration of intravenous contrast. Multiplanar CT image reconstructions and MIPs were obtained to evaluate the vascular anatomy. CONTRAST:  100 mL Isovue 370 IV COMPARISON:  Chest radiographs dated 06/03/2016 FINDINGS: Cardiovascular:  Satisfactory opacification of the pulmonary artery is to the segmental level. Positive for pulmonary embolism in the distal right main pulmonary artery (series 7/ image 148), extending into the right upper, middle, and lower lobes. Positive for pulmonary embolism in the distal left main pulmonary artery (series 7/ image 130), extending into the left upper and lower lobes. Overall clot burden is moderate to large. Elevated RV to LV ratio of 1.59, suggesting right heart strain. Mediastinum/Nodes: No suspicious mediastinal lymphadenopathy. Visualized thyroid is unremarkable. Lungs/Pleura: Segmental/ patchy ground-glass opacity in the left upper lobe (series 6/ image 63), possibly reflecting pneumonia, although pulmonary infarction is favored. Right lung is clear. No pleural effusion or pneumothorax. Upper Abdomen: Visualized upper abdomen is grossly unremarkable. Musculoskeletal: Degenerative changes of the visualized thoracolumbar spine. Review of the MIP images confirms the above findings. IMPRESSION: Pulmonary embolism in the bilateral distal main pulmonary arteries, extending into all lobes. Overall clot burden is moderate to large. Findings worrisome for right heart strain. Segmental/patchy ground-glass opacity in the left upper lobe, favoring pulmonary infarct, less likely pneumonia. Positive for acute PE with CT evidence of right heart strain (RV/LV Ratio = 1.59) consistent with at least submassive (intermediate risk) PE. The presence of right heart strain has been associated with an increased risk of morbidity and mortality. Please activate Code PE by paging [phone]. Critical Value/emergent results were called by telephone at the time of interpretation on 06/03/2016 at 12:12 pm to Dr. Tyrone Nine, who verbally acknowledged these results. Electronically Signed   By: Julian Hy M.D.   On: 06/03/2016 12:17    Procedures Procedures (including critical care time)  CRITICAL CARE Performed by: Ozella Rocks   Total critical care time: 50 minutes  Critical care time was exclusive of separately billable procedures and treating other patients.  Critical care was necessary to treat or prevent imminent or life-threatening deterioration.  Critical care was time spent personally by me on the following activities: development of treatment plan with patient and/or surrogate as well as nursing, discussions with consultants, evaluation of patient's response to treatment, examination of patient, obtaining history from patient or surrogate, ordering and performing treatments and interventions, ordering and review of laboratory studies, ordering and review of radiographic studies, pulse oximetry  and re-evaluation of patient's condition.   Medications Ordered in ED Medications  nitroGLYCERIN (NITROSTAT) SL tablet 0.4 mg (0.4 mg Sublingual Given 06/03/16 1023)  nitroGLYCERIN (NITROSTAT) SL tablet 0.4 mg (not administered)  heparin injection 4,000 Units (not administered)  iopamidol (ISOVUE-370) 76 % injection (100 mLs  Contrast Given 06/03/16 1153)   Initial Impression / Assessment and Plan / ED Course  I have reviewed the triage vital signs and the nursing notes.  Pertinent labs & imaging results that were available during my care of the patient were reviewed by me and considered in my medical decision making (see chart for details).  Final Clinical Impressions(s) / ED Diagnoses  {I have reviewed and evaluated the relevant laboratory values. {I have reviewed and evaluated the relevant imaging studies. {I have interpreted the relevant EKG. {I have reviewed the relevant previous healthcare records. {I have reviewed EMS Documentation. {I obtained HPI from historian. {Patient discussed with supervising physician.  ED Course:  Assessment: Pt is a 51 y.o. male hx DVT on Xarelto presents with CP/SOB with exertion x1 week. Seen at PCP office and concern for ACS vs PE. Risk Factors fors ACS FH, HTN, Smoking. No  hx CAD. No formal eval. Given nitro in ED with moderate relief. Concern for cardiac etiology of Chest Pain. EKG with possible STEMI on EKG with no prior to compare on anterior leads. Consulted with STEMI doc Julianne Handler) who believed likely not STEMI. Will see in ED as consult. Trop negative. Labs with no leukocytosis. CXR with new left lung infiltrate. No fever. No WBC. No cough. Doubt Pneumonia. iStat Lactic negative. Concern for PE with missed dose of Xarelto. CT Angio ordered. Pt has been re-evaluated prior to consult and VSS, NAD, heart RRR, pain 0/10, lungs CTAB.   12:17 PM- CT Angio shows Pulmonary embolism in the bilateral distal main pulmonary arteries, extending into all lobes. Overall clot burden is moderate to large. Findings worrisome for right heart strain. Segmental/patchy ground-glass opacity in the left upper lobe, favoring pulmonary infarct, less likely pneumonia. Positive for acute PE with CT evidence of right heart strain (RV/LV Ratio = 1.59) consistent with at least submassive (intermediate risk) PE. The presence of right heart strain has been associated with an increased risk of morbidity and mortality.  Consult to ICU. Likely admit to stepdown with medicine. Hold tPA unless unstable. Will see in ED as consult. Added BNP, serial trop, and ordered echo.   Disposition/Plan:  Admit Pt acknowledges and agrees with plan  Supervising Physician Deno Etienne, DO  Final diagnoses:  Other acute pulmonary embolism with acute cor pulmonale [facility])    New Prescriptions New Prescriptions   No medications on file     Shary Decamp, PA-C 06/03/16 [place], DO 06/03/16 1236

## 2016-06-04 ENCOUNTER — Inpatient Hospital Stay (HOSPITAL_COMMUNITY): Payer: 59

## 2016-06-04 DIAGNOSIS — R079 Chest pain, unspecified: Secondary | ICD-10-CM

## 2016-06-04 DIAGNOSIS — R9431 Abnormal electrocardiogram [ECG] [EKG]: Secondary | ICD-10-CM

## 2016-06-04 LAB — COMPREHENSIVE METABOLIC PANEL
ALBUMIN: 3.1 g/dL — AB (ref 3.5–5.0)
ALK PHOS: 61 U/L (ref 38–126)
ALT: 15 U/L — ABNORMAL LOW (ref 17–63)
ANION GAP: 6 (ref 5–15)
AST: 22 U/L (ref 15–41)
BILIRUBIN TOTAL: 0.8 mg/dL (ref 0.3–1.2)
BUN: 11 mg/dL (ref 6–20)
CALCIUM: 8.2 mg/dL — AB (ref 8.9–10.3)
CO2: 24 mmol/L (ref 22–32)
Chloride: 104 mmol/L (ref 101–111)
Creatinine, Ser: 1.11 mg/dL (ref 0.61–1.24)
GFR calc Af Amer: >60 mL/min (ref >60)
GFR calc non Af Amer: >60 mL/min (ref >60)
GLUCOSE: 108 mg/dL — AB (ref 65–99)
Potassium: 3.8 mmol/L (ref 3.5–5.1)
Sodium: 134 mmol/L — ABNORMAL LOW (ref 135–145)
TOTAL PROTEIN: 6.3 g/dL — AB (ref 6.5–8.1)

## 2016-06-04 LAB — VAS US LOWER EXTREMITY VENOUS (DVT)
AV Peak grad: 0 mmHg
AVPKVEL: 0 cm/s
CHL CUP DOP CALC LVOT VTI: 16.2 cm
E/e' ratio: 4.81
EWDT: 352 ms
FS: 26 % — AB (ref 28–44)
IVS/LV PW RATIO, ED: 1.16
LA ID, A-P, ES: 32 mm
LA vol: 42.2 mL
LADIAMINDEX: 1.31 cm/m2
LAVOLA4C: 36.8 mL
LAVOLIN: 17.3 mL/m2
LDCA: 3.8 cm2
LEFT ATRIUM END SYS DIAM: 32 mm
LV E/e' medial: 4.81
LV PW d: 12.9 mm — AB (ref 0.6–1.1)
LV TDI E'MEDIAL: 5
LV e' LATERAL: 9.79 cm/s
LVEEAVG: 4.81
LVOT SV: 62 mL
LVOT peak vel: 92.3 cm/s
LVOTD: 22 mm
Lateral S' vel: 10.9 cm/s
MV Dec: 352
MVPKAVEL: 67.9 m/s
MVPKEVEL: 47.1 m/s
RV TAPSE: 21.9 mm
TDI e' lateral: 9.79

## 2016-06-04 LAB — CBC
HEMATOCRIT: 44.4 % (ref 39.0–52.0)
HEMOGLOBIN: 15 g/dL (ref 13.0–17.0)
MCH: 29.9 pg (ref 26.0–34.0)
MCHC: 33.8 g/dL (ref 30.0–36.0)
MCV: 88.6 fL (ref 78.0–100.0)
Platelets: 134 10*3/uL — ABNORMAL LOW (ref 150–400)
RBC: 5.01 MIL/uL (ref 4.22–5.81)
RDW: 15.1 % (ref 11.5–15.5)
WBC: 6.6 10*3/uL (ref 4.0–10.5)

## 2016-06-04 LAB — TROPONIN I
TROPONIN I: 0.03 ng/mL — AB (ref <0.03)
Troponin I: <0.03 ng/mL (ref <0.03)

## 2016-06-04 LAB — HIV ANTIBODY (ROUTINE TESTING W REFLEX): HIV SCREEN 4TH GENERATION: NONREACTIVE

## 2016-06-04 LAB — APTT: aPTT: 99 seconds — ABNORMAL HIGH (ref 24–36)

## 2016-06-04 LAB — HEPARIN LEVEL (UNFRACTIONATED): HEPARIN UNFRACTIONATED: 1.09 [IU]/mL — AB (ref 0.30–0.70)

## 2016-06-04 MED ORDER — HYDRALAZINE HCL 20 MG/ML IJ SOLN
5.0000 mg | INTRAMUSCULAR | Status: DC | PRN
  Administered 2016-06-04: 5 mg via INTRAVENOUS
  Filled 2016-06-04: qty 1

## 2016-06-04 MED ORDER — APIXABAN 5 MG PO TABS: 5.0000 mg | ORAL_TABLET | Freq: Two times a day (BID) | ORAL | Status: DC

## 2016-06-04 MED ORDER — APIXABAN 5 MG PO TABS
10.0000 mg | ORAL_TABLET | Freq: Two times a day (BID) | ORAL | Status: DC
  Administered 2016-06-04 – 2016-06-05 (×3): 10 mg via ORAL
  Filled 2016-06-04 (×3): qty 2

## 2016-06-04 NOTE — Progress Notes (Signed)
ANTICOAGULATION CONSULT NOTE - Follow Up Consult  Pharmacy Consult for Eliquis Indication: pulmonary embolus  No Known Allergies  Patient Measurements: Height: 6\' 3"  (190.5 cm) Weight: 256 lb (116.1 kg) IBW/kg (Calculated) : 84.5  Vital Signs: Temp: 97.7 F (36.5 C) (03/08 1145) Temp Source: Oral (03/08 1145) BP: 127/107 (03/08 1145) Pulse Rate: 79 (03/08 1145)  Labs:  Recent Labs  06/03/16 1029 06/03/16 1220 06/03/16 1301 06/03/16 1915 06/04/16 0053 06/04/16 0631  HGB 16.2  --   --   --   --  15.0  HCT 47.0  --   --   --   --  44.4  PLT 127*  --   --   --   --  134*  APTT  --   --  36 69*  --  99*  LABPROT  --  18.2*  --   --   --   --   INR  --  1.50  --   --   --   --   HEPARINUNFRC  --   --  >2.20*  --   --  1.09*  CREATININE 1.01  --   --   --   --  1.11  TROPONINI  --   --   --  0.03* 0.03* <0.03    Estimated Creatinine Clearance: 106.9 mL/min (by C-G formula based on SCr of 1.11 mg/dL).   Assessment: 47 yoM with worsening SOB, CP. History of DVT on Xarelto PTA (reports missing a "few doses here and there"). Pharmacy consulted to transition from heparin to Eliquis for PE with RHS (RV/LV ratio 1.59), moderate to large clot burden. Due to having DVT on Xarelto, MD wishes to try alternative direct oral anticoagulant. Hgb stable-wnl, plt 134. Renal function stable. No bleeding noted.   Goal of Therapy:  Monitor platelets by anticoagulation protocol: Yes   Plan:  Stop Heparin Start Eliquis 10mg  BID for 7 days doses (through 3/14) Start Eliquis 5mg  BID on 3/15 CBC every 72hours Monitor signs/symptoms of bleeding

## 2016-06-04 NOTE — Progress Notes (Signed)
Report given to receiving nurse in 2W.

## 2016-06-04 NOTE — Progress Notes (Signed)
ANTICOAGULATION CONSULT NOTE - Follow Up Consult  Pharmacy Consult for heparin Indication: pulmonary embolus  No Known Allergies  Patient Measurements: Height: 6\' 3"  (190.5 cm) Weight: 256 lb (116.1 kg) IBW/kg (Calculated) : 84.5 Heparin Dosing Weight: 108.8  Vital Signs: Temp: 98 F (36.7 C) (03/08 0736) Temp Source: Oral (03/08 0736) BP: 140/111 (03/08 0736) Pulse Rate: 77 (03/08 0736)  Labs:  Recent Labs  06/03/16 1029 06/03/16 1220 06/03/16 1301 06/03/16 1915 06/04/16 0053 06/04/16 0631  HGB 16.2  --   --   --   --  15.0  HCT 47.0  --   --   --   --  44.4  PLT 127*  --   --   --   --  134*  APTT  --   --  36 69*  --  99*  LABPROT  --  18.2*  --   --   --   --   INR  --  1.50  --   --   --   --   HEPARINUNFRC  --   --  >2.20*  --   --  1.09*  CREATININE 1.01  --   --   --   --  1.11  TROPONINI  --   --   --  0.03* 0.03* <0.03   Estimated Creatinine Clearance: 106.9 mL/min (by C-G formula based on SCr of 1.11 mg/dL).  Medications:  Prescriptions Prior to Admission  Medication Sig Dispense Refill Last Dose  . albuterol (PROVENTIL HFA;VENTOLIN HFA) 108 (90 Base) MCG/ACT inhaler Inhale 2 puffs into the lungs every 6 (six) hours as needed for wheezing or shortness of breath. 1 Inhaler 0 rescue at rescue  . amLODipine (NORVASC) 10 MG tablet Take 1 tablet (10 mg total) by mouth daily. 90 tablet 3 06/03/2016 at Unknown time  . atorvastatin (LIPITOR) 40 MG tablet Take 1 tablet (40 mg total) by mouth daily. 90 tablet 3 06/03/2016 at Unknown time  . meloxicam (MOBIC) 15 MG tablet Take 1 tablet (15 mg total) by mouth daily. 40 tablet 1 06/03/2016 at Unknown time  . Menthol, Topical Analgesic, (ICY HOT EX) Apply 1 application topically every 4 (four) hours as needed (pain/swelling).   Past Week at Unknown time  . oxyCODONE-acetaminophen (ROXICET) 5-325 MG per tablet Take 1 tablet by mouth every 8 (eight) hours as needed for severe pain. 90 tablet 0 Past Week at Unknown time  .  traMADol (ULTRAM) 50 MG tablet TAKE 1 TABLET BY MOUTH EVERY 6 HOURS AS NEEDED FOR BREAKTHROUGH PAIN 30 tablet 0 Past Week at Unknown time  . XARELTO 20 MG TABS tablet TAKE 1 TABLET BY MOUTH DAILY WITH SUPPER. 30 tablet 1 06/02/2016 at 1800  . nicotine (NICODERM CQ - DOSED IN MG/24 HOURS) 14 mg/24hr patch Place 1 patch (14 mg total) onto the skin daily. (Patient not taking: Reported on 04/06/2014) 28 patch 0 Not Taking at Unknown time   Assessment: 14 yom with worsening SOB, CP. History of DVT on Xarelto PTA (reports missing a "few doses here and there"). Pharmacy consulted to start heparin for PE with RHS (RV/LV ratio 1.59), moderate to large clot burden. Hg wnl, plt 127 on admit. No bleeding noted. Heparin drip 2000 uts/hr HL remains elevated from recent rivaroxaban dose aPTT 99 sec at goal.   Goal of Therapy:  Heparin level 0.3-0.7 units/ml aPTT 66-102 seconds Monitor platelets by anticoagulation protocol: Yes   Plan:  Continue  heparin gtt to 2000 units/hr Daily aPTT/heparin  level/CBC Monitor s/sx bleeding Xarelto on hold  Bonnita Nasuti Pharm.D. CPP, BCPS Clinical Pharmacist [phone] 06/04/2016 11:21 AM

## 2016-06-04 NOTE — Consult Note (Signed)
CARDIOLOGY CONSULT NOTE   Patient ID: [name] MRN: [id number] DOB/AGE: 08/06/1965 51 y.o.  Admit date: 06/03/2016  Requesting Physician: Kathrine Cords, MD (Family Medicine) Primary Physician:   Kathrine Cords, MD Primary Cardiologist:  New Reason for Consultation:   EKG changes  HPI: Jeffrey [name] is a 51 y.o. male with a history of DVTx 2 to right leg on chronic anticoagulation (Xarelto) and hypertension.   He presented to the ER with two weeks of progressively worsening SOB with exertion for two weeks and sharp chest pains for 1 week. He missed multiple doses of his Xarelto, first because he ran out and then because his brother passed away and he "let things go". His EKG on presentation showed sloping of his anterior ST segments with some trace depressions inferiorly, cardiology was asked to look at it and did not feel that it was a STEMI. A  CT angio of the chest was performed and it resulted as bilateral PEs with right heart strain. TPA was considered and not recommended, patient admitted to [facility] unit and admitted to [facility]. He has been started on Eliquis and Heparin. Echo is pending.  Currently he is having no pain, his pain initially starting improving after receiving nitro and then completely went away after Heparin. He is not SOB currently at rest in his bed. His right leg swelling is ate baseline. Prior to this acute incident he had not been experiencing any chest pains or chest burning. He has not been experiencing any orthopnea, DOE, fatigue or weakness.                                 Past Medical History:  Diagnosis Date  . DVT (deep venous thrombosis) ([place]) 11/28/2013   RT LEG  . Hypertension   . Marijuana use      Past Surgical History:  Procedure Laterality Date  . HERNIA REPAIR     2005 and 2011  . KNEE SURGERY Bilateral    Left knee 1993, right knee 2004    No Known Allergies  I have reviewed the patient's current  medications . amLODipine  10 mg Oral Daily  . apixaban  10 mg Oral BID   Followed by  . [START ON 06/11/2016] apixaban  5 mg Oral BID  . atorvastatin  40 mg Oral Daily  . Influenza vac split quadrivalent PF  0.5 mL Intramuscular Tomorrow-1000  . nicotine  7 mg Transdermal Daily  . pneumococcal 23 valent vaccine  0.5 mL Intramuscular Tomorrow-1000  . sodium chloride flush  3 mL Intravenous Q12H    albuterol, hydrALAZINE, nitroGLYCERIN, traMADol  Prior to Admission medications   Medication Sig Start Date End Date Taking? Authorizing Provider  albuterol (PROVENTIL HFA;VENTOLIN HFA) 108 (90 Base) MCG/ACT inhaler Inhale 2 puffs into the lungs every 6 (six) hours as needed for wheezing or shortness of breath. 05/26/16  Yes Ashly M Gottschalk, DO  amLODipine (NORVASC) 10 MG tablet Take 1 tablet (10 mg total) by mouth daily. 09/16/15  Yes Archie Patten, MD  atorvastatin (LIPITOR) 40 MG tablet Take 1 tablet (40 mg total) by mouth daily. 04/06/14  Yes Timmothy Euler, MD  meloxicam (MOBIC) 15 MG tablet Take 1 tablet (15 mg total) by mouth daily. 07/16/15  Yes York Ram Melancon, MD  Menthol, Topical Analgesic, (ICY HOT EX) Apply 1 application topically every 4 (four) hours as needed (  pain/swelling).   Yes Historical Provider, MD  oxyCODONE-acetaminophen (ROXICET) 5-325 MG per tablet Take 1 tablet by mouth every 8 (eight) hours as needed for severe pain. 04/06/14  Yes Timmothy Euler, MD  traMADol (ULTRAM) 50 MG tablet TAKE 1 TABLET BY MOUTH EVERY 6 HOURS AS NEEDED FOR BREAKTHROUGH PAIN 09/27/15  Yes Crystal Libby Maw, MD  XARELTO 20 MG TABS tablet TAKE 1 TABLET BY MOUTH DAILY WITH SUPPER. 05/26/16  Yes Archie Patten, MD  nicotine (NICODERM CQ - DOSED IN MG/24 HOURS) 14 mg/24hr patch Place 1 patch (14 mg total) onto the skin daily. Patient not taking: Reported on 04/06/2014 01/02/14   Emmaline Kluver, MD     Social History   Social History  . Marital status: Married    Spouse name: N/A  . Number  of children: N/A  . Years of education: N/A   Occupational History  . Not on file.   Social History Main Topics  . Smoking status: Current Every Day Smoker    Packs/day: 0.50    Years: 37.00    Types: Cigarettes  . Smokeless tobacco: Never Used  . Alcohol use Yes     Comment: 1-2 drinks per event, few times per week  . Drug use: Yes    Types: Marijuana, Cocaine     Comment: Cocaine last used in 2004, current marijuana use  . Sexual activity: Yes    Birth control/ protection: None     Comment: With wife   Other Topics Concern  . Not on file   Social History Narrative   Lives in [place].   Chickens as pet.    Hobbies: Basketball     Family Status  Relation Status  . Other Alive  . Other Alive  . Mother   . Father   . Brother    Family History  Problem Relation Age of Onset  . Heart disease Other   . Arthritis Other   . Heart disease Other   . Arthritis Other   . Alcohol abuse Mother   . Heart disease Mother   . Depression Mother   . Hypertension Mother   . Kidney disease Mother   . Arthritis Mother   . Arthritis Father   . Alcohol abuse Brother   . Drug abuse Brother   . Sickle cell anemia Brother        ROS:  Full 14 point review of systems complete and found to be negative unless listed above.  Physical Exam:  Blood pressure (!) 134/108, pulse 89, temperature 97.7 F (36.5 C), temperature source Oral, resp. rate (!) 23, height 6\' 3"  (1.905 m), weight 256 lb (116.1 kg), SpO2 97 %.  General: Well developed, well nourished, male in no acute distress Head: Eyes PERRLA, No xanthomas.  Normocephalic and atraumatic, oropharynx without edema or exudate. Dentition:  Lungs: normal effort and rate of breathing. Heart:: normal rate and rhythm.  Neck: No carotid bruits. No lymphadenopathy.   Abdomen: Bowel sounds present, abdomen soft and non-tender without masses or hernias noted. Msk:  No spine or cva tenderness. No weakness, no joint deformities or  effusions. Extremities: No clubbing or cyanosis. Mild Right leg edema Neuro: Alert and oriented X 3. No focal deficits noted. Psych:  Good affect, responds appropriately Skin: No rashes or lesions noted.     Labs:  Lab Results  Component Value Date   WBC 6.6 06/04/2016   HGB 15.0 06/04/2016   HCT 44.4 06/04/2016   MCV 88.6  06/04/2016   PLT 134 (L) 06/04/2016    Recent Labs  06/03/16 1220  INR 1.50    Recent Labs Lab 06/04/16 0631  NA 134*  K 3.8  CL 104  CO2 24  BUN 11  CREATININE 1.11  CALCIUM 8.2*  PROT 6.3*  BILITOT 0.8  ALKPHOS 61  ALT 15*  AST 22  GLUCOSE 108*  ALBUMIN 3.1*    Recent Labs  06/03/16 1915 06/04/16 0053 06/04/16 0631  TROPONINI 0.03* 0.03* <0.03    Recent Labs  06/03/16 1043 06/03/16 1312  TROPIPOC 0.04 0.04    Echo   Pending - 3/8      ECG:  HR 98, sinus rhythm, t-wave inversion in anterior leads.      Radiology:    Dg Chest 2 View Result Date: 06/03/2016  IMPRESSION: New left midlung infiltrate. Followup PA and lateral chest X-ray is recommended in 3-4 weeks following trial of antibiotic therapy to ensure resolution and exclude underlying malignancy. Electronically Signed   By: Inez Catalina M.D.   On: 06/03/2016 10:49   Ct Angio Chest Pe W And/or Wo Contrast Result Date: 06/03/2016 IMPRESSION: Pulmonary embolism in the bilateral distal main pulmonary arteries, extending into all lobes. Overall clot burden is moderate to large. Findings worrisome for right heart strain. Segmental/patchy ground-glass opacity in the left upper lobe, favoring pulmonary infarct, less likely pneumonia. Positive for acute PE with CT evidence of right heart strain (RV/LV Ratio = 1.59) consistent with at least submassive (intermediate risk) PE. The presence of right heart strain has been associated with an increased risk of morbidity and mortality. Please activate Code PE by paging [phone]. Critical Value/emergent results were called by  telephone at the time of interpretation on 06/03/2016 at 12:12 pm to Dr. Tyrone Nine, who verbally acknowledged these results. Electronically Signed   By: Julian Hy M.D.   On: 06/03/2016 12:17    ASSESSMENT AND PLAN:      Submassive bilateral pulmonary embolism with right heart strain: T-wave inversions in anterior leads likely related to submassive PE's, will discuss with Dr. Radford Pax. He currently on Eliquis and Heparin drip per family medicine. Echo is currently pending.  Chest pain: Patient has not been having any anginal symptoms prior to these past week. It is most likely the chest pain is due to submassive bilateral PE's. Currently pain free. Echo is pending.   Hypertension:  Continue Amlodipine 10mg  daily and Hydralazine PRN per parameters set by Family Medicine     Signed: Linus Mako, PA-C 06/04/2016 2:55 PM  Co-Sign MD

## 2016-06-04 NOTE — Progress Notes (Signed)
Family Medicine Teaching Service Daily Progress Note Intern Pager: [phone]  [name] Medical record number: [id number] Date of birth: 01-01-1966 Age: 51 y.o. Gender: male  Primary Care Provider: Kathrine Cords, MD Consultants: CCM Code Status: Full  Pt Overview and Major Events to Date:  1. 3/7 hep drip for submassive bilateral PE 2. 3/8 Eliquis 10mg  BID   Assessment and Plan: Jeffrey [name] is a 51 y.o. 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 PMH: DVTx2 on chronic anticoagulation, HTN, tobacco use.   Submassive bilateral pulmonary embolism with right heart strain: On heparin drip and asymptomatic. Denies shortness of breath or chest pain.  Blood pressure elevated this morning with diastolic to high 637. Patient had not yet received his 10mg  norvasc. Additionally denied HA or changes in vision. Satting 99% on 2.5L and turned off O2 and he remained 97-100% for several minutes without complaints. Repeat EKG last night showed anterior t-wave inversions and this was discussed with Cardiology Fellow who felt this was related to PE.  - Cardiology consult placed 3/8 -turned off O2 - eliquis 10mg  BID x7 days and then 5mg  BID thereafter - Echo  - repeat CT chest 6-8 weeks to assess stability of clots -may benefit from hypercoagulable work up after d/c if this has not been completed in the past   Chest pain:  Likely 2/2 PE as this has improved overnight. Did have new anterior t-wave inversions.  Was assured by Cardiologist that these are not worrisome.  - placed Cardiology c/s 3/8 - transitioned to eliquis 10mg  BID x7 days and then 5mg  BID thereafter  LUL Infiltrate:  Improving clinical status without abx,likely 2/2 infarction.  -hold off on abx therapy for now and watch clinical status -plan for repeat CT chest in 6-8 weeks and  could likely f/u infiltrate at that time   Hypertension: Takes amlodipine 10mg  daily, first dose this morning at 10AM. BP elevated to 143/109 this morning.  - continue amlodipine 10mg  daily - hydralazine 5mg  PRN SBP > 180 or DBP >110  Chronic pain: History of bilateral knee and lumbar pain. Takes tramadol 50mg  q6hrs and meloxicam 15mg  daily.  - continue tramadol - holding meloxicam   Tobacco Use: Patient admits to smoking but has been attempting to quit.  -Nicotine 7 mg patch ordered   FEN/GI: Heart healthy diet Prophylaxis: Heparin  Disposition: admit to SDU with telemetry under attending Mingo Amber. Will continue on heparin drip and add DOAC in 24hrs.  Subjective:  Feels well this morning. Denies CP or shortness of breath.  Does have elevated BP, but denies headache or changes in vision.   Objective: Temp:  [97.7 F (36.5 C)-99.1 F (37.3 C)] 98 F (36.7 C) (03/08 0736) Pulse Rate:  [77-112] 77 (03/08 0736) Resp:  [15-31] 17 (03/08 0736) BP: (124-159)/(97-114) 140/111 (03/08 0736) SpO2:  [86 %-98 %] 97 % (03/08 0736) Weight:  [256 lb (116.1 kg)] 256 lb (116.1 kg) (03/07 1018) Physical Exam: General: 51yo M with nasal canula in place, appearing comfortable Cardiovascular: RRR, no murmurs Respiratory: CTABL, no increased WOB Abdomen: soft, NTND Extremities: warm and well perfused, no cyanosis, clubbing or edema. Negative homan's sign.   Laboratory:  Recent Labs Lab 06/03/16 1029 06/04/16 0631  WBC 6.5 6.6  HGB 16.2 15.0  HCT 47.0 44.4  PLT 127* 134*    Recent  Labs Lab 06/03/16 1029 06/04/16 0631  NA 136 134*  K 4.1 3.8  CL 104 104  CO2 22 24  BUN 11 11  CREATININE 1.01 1.11  CALCIUM 8.8* 8.2*  PROT  --  6.3*  BILITOT  --  0.8  ALKPHOS  --  61  ALT  --  15*  AST  --  22  GLUCOSE 112* 108*    Imaging/Diagnostic Tests: Dg Chest 2 View  Result Date: 06/03/2016 CLINICAL DATA:  Chest pain for 2 weeks with shortness of breath EXAM: CHEST  2 VIEW  COMPARISON:  05/26/2016 FINDINGS: Cardiac shadow is within normal limits. The lungs are well aerated bilaterally. Stable blunting of the right costophrenic angle is seen. There is patchy infiltrate noted in the left mid lung projecting in the left upper lobe on the lateral film. No bony abnormality is noted. IMPRESSION: New left midlung infiltrate. Followup PA and lateral chest X-ray is recommended in 3-4 weeks following trial of antibiotic therapy to ensure resolution and exclude underlying malignancy. Electronically Signed   By: Inez Catalina M.D.   On: 06/03/2016 10:49   Ct Angio Chest Pe W And/or Wo Contrast  Result Date: 06/03/2016 CLINICAL DATA:  Frequent shortness of breath, history of DVT EXAM: CT ANGIOGRAPHY CHEST WITH CONTRAST TECHNIQUE: Multidetector CT imaging of the chest was performed using the standard protocol during bolus administration of intravenous contrast. Multiplanar CT image reconstructions and MIPs were obtained to evaluate the vascular anatomy. CONTRAST:  100 mL Isovue 370 IV COMPARISON:  Chest radiographs dated 06/03/2016 FINDINGS: Cardiovascular: Satisfactory opacification of the pulmonary artery is to the segmental level. Positive for pulmonary embolism in the distal right main pulmonary artery (series 7/ image 148), extending into the right upper, middle, and lower lobes. Positive for pulmonary embolism in the distal left main pulmonary artery (series 7/ image 130), extending into the left upper and lower lobes. Overall clot burden is moderate to large. Elevated RV to LV ratio of 1.59, suggesting right heart strain. Mediastinum/Nodes: No suspicious mediastinal lymphadenopathy. Visualized thyroid is unremarkable. Lungs/Pleura: Segmental/ patchy ground-glass opacity in the left upper lobe (series 6/ image 63), possibly reflecting pneumonia, although pulmonary infarction is favored. Right lung is clear. No pleural effusion or pneumothorax. Upper Abdomen: Visualized upper abdomen is  grossly unremarkable. Musculoskeletal: Degenerative changes of the visualized thoracolumbar spine. Review of the MIP images confirms the above findings. IMPRESSION: Pulmonary embolism in the bilateral distal main pulmonary arteries, extending into all lobes. Overall clot burden is moderate to large. Findings worrisome for right heart strain. Segmental/patchy ground-glass opacity in the left upper lobe, favoring pulmonary infarct, less likely pneumonia. Positive for acute PE with CT evidence of right heart strain (RV/LV Ratio = 1.59) consistent with at least submassive (intermediate risk) PE. The presence of right heart strain has been associated with an increased risk of morbidity and mortality. Please activate Code PE by paging [phone]. Critical Value/emergent results were called by telephone at the time of interpretation on 06/03/2016 at 12:12 pm to Dr. Tyrone Nine, who verbally acknowledged these results. Electronically Signed   By: Julian Hy M.D.   On: 06/03/2016 12:17    Eloise Levels, MD 06/04/2016, 8:31 AM PGY-1, [place] Intern pager: [phone], text pages welcome

## 2016-06-04 NOTE — Progress Notes (Signed)
Jeffrey [name] is my clinic patient, although I have not had the opportunity to meet him. He's currently hospitalized for PEs. He states he's breathing better at rest, hasn't been up moving around too much. No chest pain.  I appreciate the efforts of the inpatient providers and look forward to seeing the pt in the outpatient setting.  Archie Patten, MD [facility] Family Medicine Resident  06/04/2016, 1:32 PM

## 2016-06-04 NOTE — Consult Note (Signed)
   [facility] CM Inpatient Consult   06/04/2016  Jeffrey [name] [birth year] [id number]    Came to visit Jeffrey [name] on behalf of [place] to Wellness program for [place] employees/dependents with [facility] insurance. Spoke with his nurse prior to going in room.  Jeffrey [name] wife is an Furniture conservator/restorer. Explained Link to [employer] and provided brochure and contact information. Confirmed best contact number as [phone] for post discharge call. Appreciative of visit.    Marthenia Rolling, MSN-Ed, RN,BSN [facility] Liaison [phone]

## 2016-06-04 NOTE — Progress Notes (Signed)
Patient transferred to 2W18 with all belongings.

## 2016-06-04 NOTE — Progress Notes (Signed)
  Echocardiogram 2D Echocardiogram has been performed.  Jeffrey [name] M 06/04/2016, 10:48 AM

## 2016-06-05 DIAGNOSIS — M545 Low back pain: Secondary | ICD-10-CM

## 2016-06-05 DIAGNOSIS — R9431 Abnormal electrocardiogram [ECG] [EKG]: Secondary | ICD-10-CM

## 2016-06-05 DIAGNOSIS — R079 Chest pain, unspecified: Secondary | ICD-10-CM

## 2016-06-05 LAB — BASIC METABOLIC PANEL
Anion gap: 7 (ref 5–15)
BUN: 10 mg/dL (ref 6–20)
CHLORIDE: 104 mmol/L (ref 101–111)
CO2: 25 mmol/L (ref 22–32)
Calcium: 8.6 mg/dL — ABNORMAL LOW (ref 8.9–10.3)
Creatinine, Ser: 1.01 mg/dL (ref 0.61–1.24)
GFR calc non Af Amer: >60 mL/min (ref >60)
Glucose, Bld: 101 mg/dL — ABNORMAL HIGH (ref 65–99)
POTASSIUM: 3.7 mmol/L (ref 3.5–5.1)
SODIUM: 136 mmol/L (ref 135–145)

## 2016-06-05 LAB — CBC
HEMATOCRIT: 44.3 % (ref 39.0–52.0)
Hemoglobin: 14.9 g/dL (ref 13.0–17.0)
MCH: 29.6 pg (ref 26.0–34.0)
MCHC: 33.6 g/dL (ref 30.0–36.0)
MCV: 87.9 fL (ref 78.0–100.0)
Platelets: 124 10*3/uL — ABNORMAL LOW (ref 150–400)
RBC: 5.04 MIL/uL (ref 4.22–5.81)
RDW: 14.6 % (ref 11.5–15.5)
WBC: 6.2 10*3/uL (ref 4.0–10.5)

## 2016-06-05 LAB — APTT: aPTT: 31 seconds (ref 24–36)

## 2016-06-05 LAB — HEPARIN LEVEL (UNFRACTIONATED): Heparin Unfractionated: 2 IU/mL — ABNORMAL HIGH (ref 0.30–0.70)

## 2016-06-05 MED ORDER — NICOTINE 21 MG/24HR TD PT24: 21.0000 mg | MEDICATED_PATCH | Freq: Every day | TRANSDERMAL | 0 refills | Status: DC

## 2016-06-05 MED ORDER — APIXABAN 5 MG PO TABS: 10.0000 mg | ORAL_TABLET | Freq: Two times a day (BID) | ORAL | 3 refills | Status: DC

## 2016-06-05 MED ORDER — ATORVASTATIN CALCIUM 40 MG PO TABS: 40.0000 mg | ORAL_TABLET | Freq: Every day | ORAL | 0 refills | Status: DC

## 2016-06-05 MED ORDER — TRAMADOL HCL 50 MG PO TABS: 50.0000 mg | ORAL_TABLET | Freq: Four times a day (QID) | ORAL | 0 refills | Status: DC | PRN

## 2016-06-05 MED FILL — ATORVASTATIN 40 MG TABLET: 40 | 30 days supply | Qty: 30 | Fill #0

## 2016-06-05 MED FILL — traMADol HCL 50 MG TABS: 50 | 7 days supply | Qty: 30 | Fill #0

## 2016-06-05 MED FILL — ELIQUIS 5 MG TABLET: 5 | 30 days supply | Qty: 60 | Fill #0

## 2016-06-05 NOTE — Discharge Summary (Signed)
[facility] Discharge Summary  [name] Medical record number: [id number] Date of birth: [birth year] Age: 51 y.o. Gender: male Date of Admission: 06/03/2016  Date of Discharge: 06/05/2016 Admitting Physician: Alveda Reasons, MD  Primary Care Provider: Kathrine Cords, MD Consultants: Cardiology, CCM  Indication for Hospitalization: Chest pain with shortness of breath  Discharge Diagnoses/Problem List:  Submassive bilateral pulmonary embolism with right heart strain Chest pain Left upper lobe infiltrate Hypertension Chronic pain Tobacco use  Disposition: Discharge home  Discharge Condition: Stable, improved  Discharge Exam:  General: 51yo M sitting up in bed appearing comfortable Cardiovascular: RRR, no murmurs Respiratory: CTABL, no increased WOB Abdomen: soft, NTND Extremities: warm and well perfused, no cyanosis, clubbing or edema. Negative homan's sign.   Brief Hospital Course:  Patient was seen at clinic with complaints of 3 weeks of increasing fatigue and malaise as well as shortness of breath 1 week of sharp pleuritic chest pain. History of 2 DVTs, and has been on xarelto daily. Reportedly had missed 3-4 doses in the past month, but had told physician and primary care office that he was not taking it at all. Over the past week he had been having increasing sharp left-sided chest pain worse with exertion and also at rest. In the ED had a CTA that showed massive bilateral pulmonary embolus extending into all lobes and right heart strain. Was placed on heparin drip and critical care was consulted, who stated that TPA was not appropriate at that time and ultimately signed off. Patient was clinically stable and was admitted to SDU with telemetry.    Initial EKG showed questionable STEMI and cardiology was consulted over the phone, who stated that EKG did not represent STEMI.  Repeated EKG to rule out worsening strain, and there were  new T-wave inversions and this was discussed with cardiologist on-call who stated this was consistent with pulmonary embolus. Consulted cardiology the following morning who recommended following up on echocardiogram and if this was normal. Recommend outpatient stress test. Echocardiogram showed normal ejection fraction and grade 1 diastolic dysfunction.   After 24hrs. patient was transitioned to eliquis 10 mg twice a day 7 days and will transition to 5 mg twice a day after that. At the time of discharge his chest pain was relieved, he was off oxygen and satting appropriately for greater than 24 hours and trending pulse ox revealed no desaturations.  Issues for Follow Up:  1. Patient should follow-up with pulmonology in 6-8 weeks for CT chest to assess for stability of clot 2. Cardiology recommended outpatient stress test. Please make sure he is set up with Cardiology. Dr. Radford Pax saw him in the hospital. 3. History of poor compliance for anticoagulation: Assess for compliance of eliquis 10 mg twice a day 7 days and then 5 mg as twice a day thereafter 4. Tobacco use disorder: During admission patient was committed to quitting smoking, discharged him on 21 mg nicotine patch. Plans to follow-up with Dr. Valentina Lucks for taper.   Significant Procedures: CTA chest, echocardiogram  Significant Labs and Imaging:   Recent Labs Lab 06/03/16 1029 06/04/16 0631 06/05/16 0330  WBC 6.5 6.6 6.2  HGB 16.2 15.0 14.9  HCT 47.0 44.4 44.3  PLT 127* 134* 124*    Recent Labs Lab 06/03/16 1029 06/04/16 0631 06/05/16 0330  NA 136 134* 136  K 4.1 3.8 3.7  CL 104 104 104  CO2 22 24 25   GLUCOSE 112* 108* 101*  BUN 11 11  10  CREATININE 1.01 1.11 1.01  CALCIUM 8.8* 8.2* 8.6*  ALKPHOS  --  61  --   AST  --  22  --   ALT  --  15*  --   ALBUMIN  --  3.1*  --      Results/Tests Pending at Time of Discharge: none  Discharge Medications:  Allergies as of 06/05/2016   No Known Allergies     Medication List     STOP taking these medications   ICY HOT EX   meloxicam 15 MG tablet Commonly known as:  MOBIC   nicotine 14 mg/24hr patch Commonly known as:  NICODERM CQ - dosed in mg/24 hours Replaced by:  nicotine 21 mg/24hr patch   oxyCODONE-acetaminophen 5-325 MG tablet Commonly known as:  ROXICET   XARELTO 20 MG Tabs tablet Generic drug:  rivaroxaban     TAKE these medications   albuterol 108 (90 Base) MCG/ACT inhaler Commonly known as:  PROVENTIL HFA;VENTOLIN HFA Inhale 2 puffs into the lungs every 6 (six) hours as needed for wheezing or shortness of breath.   amLODipine 10 MG tablet Commonly known as:  NORVASC Take 1 tablet (10 mg total) by mouth daily.   apixaban 5 MG Tabs tablet Commonly known as:  ELIQUIS Take 2 tablets (10 mg total) by mouth 2 (two) times daily. From 3/8/-3/14 and then 1 tab (5mg  total) by mouth 2 times daily   atorvastatin 40 MG tablet Commonly known as:  LIPITOR Take 1 tablet (40 mg total) by mouth daily. What changed:  Another medication with the same name was added. Make sure you understand how and when to take each.   atorvastatin 40 MG tablet Commonly known as:  LIPITOR Take 1 tablet (40 mg total) by mouth daily. What changed:  You were already taking a medication with the same name, and this prescription was added. Make sure you understand how and when to take each.   nicotine 21 mg/24hr patch Commonly known as:  NICODERM CQ - dosed in mg/24 hours Place 1 patch (21 mg total) onto the skin daily. Replaces:  nicotine 14 mg/24hr patch   traMADol 50 MG tablet Commonly known as:  ULTRAM Take 1 tablet (50 mg total) by mouth every 6 (six) hours as needed for moderate pain. What changed:  See the new instructions.       Discharge Instructions: Please refer to Patient Instructions section of EMR for full details.  Patient was counseled important signs and symptoms that should prompt return to medical care, changes in medications, dietary  instructions, activity restrictions, and follow up appointments.   Follow-Up Appointments: Follow-up Information    Georges Lynch, MD. Go on 06/08/2016.   Specialty:  Family Medicine Why:  8:30am for Hospital followup with Dr. Newman Nickels information: [address] [place] [address] [phone]           Eloise Levels, MD 06/06/2016, 10:50 AM PGY-1, [place]

## 2016-06-05 NOTE — Progress Notes (Signed)
Transitions of Care Pharmacy Note  Plan:  Educated on apixaban, smoking cessation Addressed concerns regarding barriers to smoking cessation Recommend 21mg  nicotine patch daily x6 weeks, then 14mg  x2 weeks, then 7mg  x2 weeks Follow-up with Dr. Valentina Lucks outpatient regarding smoking cessation follow-up --------------------------------------------- Jeffrey [name] is an 51 y.o. male who presents with a chief complaint SOB and chest pain 2/2 to PE. In anticipation of discharge, pharmacy has reviewed this patient's prior to admission medication history, as well as current inpatient medications listed per the [facility].  Current medication indications, dosing, frequency, and notable side effects reviewed with patient. patient verbalized understanding of current inpatient medication regimen and is aware that the After Visit Summary when presented, will represent the most accurate medication list at discharge.   Jeffrey [name] expressed concerns regarding barriers to smoking cessation. We decided to pursue with a patch to help him quit. We also discussed the quitline and using small mints or candies to help him curve cravings. We discussed the importance of social support and the need for him to let his family and friends know that he is attempting to quit.    Assessment: Understanding of regimen: good Understanding of indications: good Potential of compliance: good Barriers to Obtaining Medications: No  Patient instructed to contact inpatient pharmacy team with further questions or concerns if needed.    Time spent preparing for discharge counseling: 15 Time spent counseling patient: 52   Thank you for allowing pharmacy to be a part of this patient's care.  Myer Peer Grayland Ormond), PharmD  PGY1 Pharmacy Resident Pager: [phone] 06/05/2016 9:06 AM

## 2016-06-05 NOTE — Discharge Instructions (Signed)
You were sent home with some new medications. You will be taking eliquis (instead of xarelto) from now on. Take 2 pills (10mg  total) 2 times a day from 3/8-3/14.  After that take 1 pill (5mg  total) 2 times a day.  Also I sent you home with a prescription for nicotine patch.  I gave you enough for a couple of weeks. When you follow up with Dr. Alease Frame on 3/12 at 8:30AM you can talk a little more about how to manage that.    Pulmonary Embolism A pulmonary embolism (PE) is a sudden blockage or decrease of blood flow in one lung or both lungs. Most blockages come from a blood clot that travels from the legs or the pelvis to the lungs. PE is a dangerous and potentially life-threatening condition if it is not treated right away. What are the causes? A pulmonary embolism occurs most commonly when a blood clot travels from one of your veins to your lungs. Rarely, PE is caused by air, fat, amniotic fluid, or part of a tumor traveling through your veins to your lungs. What increases the risk? A PE is more likely to develop in:  People who smoke.  People who areolder, especially over 14 years of age.  People who are overweight (obese).  People who sit or lie still for a long time, such as during long-distance travel (over 4 hours), bed rest, hospitalization, or during recovery from certain medical conditions like a stroke.  People who do not engage in much physical activity (sedentary lifestyle).  People who have chronic breathing disorders.  People whohave a personal or family history of blood clots or blood clotting disease.  People whohave peripheral vascular disease (PVD), diabetes, or some types of cancer.  People who haveheart disease, especially if the person had a recent heart attack or has congestive heart failure.  People who have neurological diseases that affect the legs (leg paresis).  People who have had a traumatic injury, such as breaking a hip or leg.  People whohave  recently had major or lengthy surgery, especially on the hip, knee, or abdomen.  People who have hada central line placed inside a large vein.  People who takemedicines that contain the hormone estrogen. These include birth control pills and hormone replacement therapy.  Pregnancy or during childbirth or the postpartum period. What are the signs or symptoms? The symptoms of a PE usually start suddenly and include:  Shortness of breath while active or at rest.  Coughing or coughing up blood or blood-tinged mucus.  Chest pain that is often worse with deep breaths.  Rapid or irregular heartbeat.  Feeling light-headed or dizzy.  Fainting.  Feelinganxious.  Sweating. There may also be pain and swelling in a leg if that is where the blood clot started. These symptoms may represent a serious problem that is an emergency. Do not wait to see if the symptoms will go away. Get medical help right away. Call your local emergency services (911 in the [place] Do not drive yourself to the hospital.  How is this diagnosed? Your health care provider will take a medical history and perform a physical exam. You may also have other tests, including:  Blood tests to assess the clotting properties of your blood, assess oxygen levels in your blood, and find blood clots.  Imaging tests, such as CT, ultrasound, MRI, X-ray, and other tests to see if you have clots anywhere in your body.  An electrocardiogram (ECG) to look for heart strain from  blood clots in the lungs. How is this treated? The main goals of PE treatment are:  To stop a blood clot from growing larger.  To stop new blood clots from forming. The type of treatment that you receive depends on many factors, such as the cause of your PE, your risk for bleeding or developing more clots, and other medical conditions that you have. Sometimes, a combination of treatments is necessary. This condition may be treated with:  Medicines, including  newer oral blood thinners (anticoagulants), warfarin, low molecular weight heparins, thrombolytics, or heparins.  Wearing compression stockings or using different types of devices.  Surgery (rare) to remove the blood clot or to place a filter in your abdomen to stop the blood clot from traveling to your lungs. Treatments for a PE are often divided into immediate treatment, long-term treatment (up to 3 months after PE), and extended treatment (more than 3 months after PE). Your treatment may continue for several months. This is called maintenance therapy, and it is used to prevent the forming of new blood clots. You can work with your health care provider to choose the treatment program that is best for you. What are anticoagulants?  Anticoagulants are medicines that treat PEs. They can stop current blood clots from growing and stop new clots from forming. They cannot dissolve existing clots. Your body dissolves clots by itself over time. Anticoagulants are given by mouth, by injection, or through an IV tube. What are thrombolytics?  Thrombolytics are clot-dissolving medicines that are used to dissolve a PE. They carry a high risk of bleeding, so they tend to be used only in severe cases or if you have very low blood pressure. Follow these instructions at home: If you are taking a newer oral anticoagulant:   Take the medicine every single day at the same time each day.  Understand what foods and drugs interact with this medicine.  Understand that there are no regular blood tests required when using this medicine.  Understandthe side effects of this medicine, including excessive bruising or bleeding. Ask your health care provider or pharmacist about other possible side effects. If you are taking warfarin:   Understand how to take warfarin and know which foods can affect how warfarin works in Veterinary surgeon.  Understand that it is dangerous to taketoo much or too little warfarin. Too much warfarin  increases the risk of bleeding. Too little warfarin continues to allow the risk for blood clots.  Follow your PT and INR blood testing schedule. The PT and INR results allow your health care provider to adjust your dose of warfarin. It is very important that you have your PT and INR tested as often as told by your health care provider.  Avoid major changes in your diet, or tell your health care provider before you change your diet. Arrange a visit with a registered dietitian to answer your questions. Many foods, especially foods that are high in vitamin K, can interfere with warfarin and affect the PT and INR results. Eat a consistent amount of foods that are high in vitamin K, such as:  Spinach, kale, broccoli, cabbage, collard greens, turnip greens, Brussels sprouts, peas, cauliflower, seaweed, and parsley.  Beef liver and pork liver.  Green tea.  Soybean oil.  Tell your health care provider about any and all medicines, vitamins, and supplements that you take, including aspirin and other over-the-counter anti-inflammatory medicines. Be especially cautious with aspirin and anti-inflammatory medicines. Do not take those before you ask your  health care provider if it is safe to do so. This is important because many medicines can interfere with warfarin and affect the PT and INR results.  Do not start or stop taking any over-the-counter or prescription medicine unless your health care provider or pharmacist tells you to do so. If you take warfarin, you will also need to do these things:  Hold pressure over cuts for longer than usual.  Tell your dentist and other health care providers that you are taking warfarin before you have any procedures in which bleeding may occur.  Avoid alcohol or drink very small amounts. Tell your health care provider if you change your alcohol intake.  Do not use tobacco products, including cigarettes, chewing tobacco, and e-cigarettes. If you need help quitting,  ask your health care provider.  Avoid contact sports. General instructions   Take over-the-counter and prescription medicines only as told by your health care provider. Anticoagulant medicines can have side effects, including easy bruising and difficulty stopping bleeding. If you are prescribed an anticoagulant, you will also need to do these things:  Hold pressure over cuts for longer than usual.  Tell your dentist and other health care providers that you are taking anticoagulants before you have any procedures in which bleeding may occur.  Avoid contact sports.  Wear a medical alert bracelet or carry a medical alert card that says you have had a PE.  Ask your health care provider how soon you can go back to your normal activities. Stay active to prevent new blood clots from forming.  Make sure to exercise while traveling or when you have been sitting or standing for a long period of time. It is very important to exercise. Exercise your legs by walking or by tightening and relaxing your leg muscles often. Take frequent walks.  Wear compression stockings as told by your health care provider to help prevent more blood clots from forming.  Do not use tobacco products, including cigarettes, chewing tobacco, and e-cigarettes. If you need help quitting, ask your health care provider.  Keep all follow-up appointments with your health care provider. This is important. How is this prevented? Take these actions to decrease your risk of developing another PE:  Exercise regularly. For at least 30 minutes every day, engage in:  Activity that involves moving your arms and legs.  Activity that encourages good blood flow through your body by increasing your heart rate.  Exercise your arms and legs every hour during long-distance travel (over 4 hours). Drink plenty of water and avoid drinking alcohol while traveling.  Avoid sitting or lying in bed for long periods of time without moving your  legs.  Maintain a weight that is appropriate for your height. Ask your health care provider what weight is healthy for you.  If you are a woman who is over 71 years of age, avoid unnecessary use of medicines that contain estrogen. These include birth control pills.  Do not smoke, especially if you take estrogen medicines. If you need help quitting, ask your health care provider.  If you are at very high risk for PE, wear compression stockings.  If you recently had a PE, have regularly scheduled ultrasound testing on your legs to check for new blood clots. If you are hospitalized, prevention measures may include:  Early walking after surgery, as soon as your health care provider says that it is safe.  Receiving anticoagulants to prevent blood clots. If you cannot take anticoagulants, other options may be available,  such as wearing compression stockings or using different types of devices. Get help right away if:  You have new or increased pain, swelling, or redness in an arm or leg.  You have numbness or tingling in an arm or leg.  You have shortness of breath while active or at rest.  You have chest pain.  You have a rapid or irregular heartbeat.  You feel light-headed or dizzy.  You cough up blood.  You notice blood in your vomit, bowel movement, or urine.  You have a fever. These symptoms may represent a serious problem that is an emergency. Do not wait to see if the symptoms will go away. Get medical help right away. Call your local emergency services (911 in the [place] Do not drive yourself to the hospital. This information is not intended to replace advice given to you by your health care provider. Make sure you discuss any questions you have with your health care provider. Document Released: 03/13/2000 Document Revised: 08/22/2015 Document Reviewed: 07/11/2014 Elsevier Interactive Patient Education  2017 [employer].

## 2016-06-05 NOTE — Progress Notes (Signed)
Patient in a stable condition. Discharge education reviewed with patient, he verbalised understanding, iv removed, tele dc ccmd notified, personal belongings at bedside, patient taken off the unit on a wheelchair by a hospital volunteer

## 2016-06-05 NOTE — Care Management Note (Signed)
Case Management Note  Patient Details  Name: VERON SENNER MRN: [id number] Date of Birth: 1965/12/12  Subjective/Objective:                 CM spoke with patient at the bedside. He is from home with wife, who works at [employer], they use [employer]. Patient instructed that [facility] can fill Eliquis if DC's after hours. Patient given 30 day and $10 copay card. Please discharge with paper scripts if DC's after hours. Patient independent, denies further CM assistance.    Action/Plan:   Expected Discharge Date:  06/05/16               Expected Discharge Plan:  Home/Self Care  In-House Referral:     Discharge planning Services  CM Consult, Medication Assistance (Eliquis card)  Post Acute Care Choice:    Choice offered to:     DME Arranged:    DME Agency:     HH Arranged:    HH Agency:     Status of Service:  Completed, signed off  If discussed at [employer], dates discussed:    Additional Comments:  Carles Collet, RN 06/05/2016, 11:27 AM

## 2016-06-05 NOTE — Progress Notes (Signed)
Family Medicine Teaching Service Daily Progress Note Intern Pager: [phone]  [name] Medical record number: [id number] Date of birth: February 26, 1966 Age: 51 y.o. Gender: male  Primary Care Provider: Kathrine Cords, MD Consultants: CCM Code Status: Full  Pt Overview and Major Events to Date:  1. 3/7 hep drip for submassive bilateral PE 2. 3/8 Eliquis 10mg  BID   Assessment and Plan: Jeffrey [name] is a 51 y.o. 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 PMH: DVTx2 on chronic anticoagulation, HTN, tobacco use.   Submassive bilateral pulmonary embolism with right heart strain, stable: Transitioned to eliquis yesterday and patient remained stable on room air overnight. Cardiology recommends following up echo and if normal, outpatient nuclear stress test.  - eliquis 10mg  BID x7 days and then 5mg  BID thereafter - f/u echo   - repeat CT chest 6-8 weeks to assess stability of clots - may benefit from hypercoagulable work up after d/c if this has not been completed in the past   Chest pain, resolved:  Seen by Cardiology 3/8 who reviewed case and felt EKG changes related to right heart strain.  - follow up echo, if normal outpatient nuclear stress test - transitioned to eliquis 10mg  BID x7 days and then 5mg  BID thereafter  LUL Infiltrate, improving:  Improving clinical status without abx,likely 2/2 infarction.  -hold off on abx therapy for now and watch clinical status -plan for repeat CT chest in 6-8 weeks and could likely f/u infiltrate at that time   Hypertension, stable: BP elevated to 144/94 this morning.  - continue amlodipine 10mg  daily - hydralazine 5mg  PRN SBP > 180 or DBP >110  Chronic pain, stable: History of bilateral knee and lumbar pain. Takes tramadol 50mg  q6hrs and meloxicam 15mg  daily.  - continue tramadol - holding  meloxicam   Tobacco Use: Patient admits to smoking but has been attempting to quit.  -Nicotine 7 mg patch ordered  - DC with 21 mg  FEN/GI: Heart healthy diet Prophylaxis: Heparin  Disposition: DC today   Subjective:  Feels well this morning. Denies CP or shortness of breath.    Objective: Temp:  [97.7 F (36.5 C)-99 F (37.2 C)] 98 F (36.7 C) (03/09 0527) Pulse Rate:  [75-89] 78 (03/09 0527) Resp:  [16-23] 18 (03/09 0527) BP: (127-157)/(93-111) 144/94 (03/09 0527) SpO2:  [95 %-99 %] 98 % (03/09 0527) Weight:  [253 lb 9.6 oz (115 kg)] 253 lb 9.6 oz (115 kg) (03/08 2143) Physical Exam: General: 50yo M sitting up in bed appearing comfortable Cardiovascular: RRR, no murmurs Respiratory: CTABL, no increased WOB Abdomen: soft, NTND Extremities: warm and well perfused, no cyanosis, clubbing or edema. Negative homan's sign.   Laboratory:  Recent Labs Lab 06/03/16 1029 06/04/16 0631 06/05/16 0330  WBC 6.5 6.6 6.2  HGB 16.2 15.0 14.9  HCT 47.0 44.4 44.3  PLT 127* 134* 124*    Recent Labs Lab 06/03/16 1029 06/04/16 0631 06/05/16 0330  NA 136 134* 136  K 4.1 3.8 3.7  CL 104 104 104  CO2 22 24 25   BUN 11 11 10   CREATININE 1.01 1.11 1.01  CALCIUM 8.8* 8.2* 8.6*  PROT  --  6.3*  --   BILITOT  --  0.8  --   ALKPHOS  --  61  --   ALT  --  15*  --  AST  --  22  --   GLUCOSE 112* 108* 101*    Imaging/Diagnostic Tests: Dg Chest 2 View  Result Date: 06/03/2016 CLINICAL DATA:  Chest pain for 2 weeks with shortness of breath EXAM: CHEST  2 VIEW COMPARISON:  05/26/2016 FINDINGS: Cardiac shadow is within normal limits. The lungs are well aerated bilaterally. Stable blunting of the right costophrenic angle is seen. There is patchy infiltrate noted in the left mid lung projecting in the left upper lobe on the lateral film. No bony abnormality is noted. IMPRESSION: New left midlung infiltrate. Followup PA and lateral chest X-ray is recommended in 3-4 weeks following trial  of antibiotic therapy to ensure resolution and exclude underlying malignancy. Electronically Signed   By: Inez Catalina M.D.   On: 06/03/2016 10:49   Ct Angio Chest Pe W And/or Wo Contrast  Result Date: 06/03/2016 CLINICAL DATA:  Frequent shortness of breath, history of DVT EXAM: CT ANGIOGRAPHY CHEST WITH CONTRAST TECHNIQUE: Multidetector CT imaging of the chest was performed using the standard protocol during bolus administration of intravenous contrast. Multiplanar CT image reconstructions and MIPs were obtained to evaluate the vascular anatomy. CONTRAST:  100 mL Isovue 370 IV COMPARISON:  Chest radiographs dated 06/03/2016 FINDINGS: Cardiovascular: Satisfactory opacification of the pulmonary artery is to the segmental level. Positive for pulmonary embolism in the distal right main pulmonary artery (series 7/ image 148), extending into the right upper, middle, and lower lobes. Positive for pulmonary embolism in the distal left main pulmonary artery (series 7/ image 130), extending into the left upper and lower lobes. Overall clot burden is moderate to large. Elevated RV to LV ratio of 1.59, suggesting right heart strain. Mediastinum/Nodes: No suspicious mediastinal lymphadenopathy. Visualized thyroid is unremarkable. Lungs/Pleura: Segmental/ patchy ground-glass opacity in the left upper lobe (series 6/ image 63), possibly reflecting pneumonia, although pulmonary infarction is favored. Right lung is clear. No pleural effusion or pneumothorax. Upper Abdomen: Visualized upper abdomen is grossly unremarkable. Musculoskeletal: Degenerative changes of the visualized thoracolumbar spine. Review of the MIP images confirms the above findings. IMPRESSION: Pulmonary embolism in the bilateral distal main pulmonary arteries, extending into all lobes. Overall clot burden is moderate to large. Findings worrisome for right heart strain. Segmental/patchy ground-glass opacity in the left upper lobe, favoring pulmonary infarct,  less likely pneumonia. Positive for acute PE with CT evidence of right heart strain (RV/LV Ratio = 1.59) consistent with at least submassive (intermediate risk) PE. The presence of right heart strain has been associated with an increased risk of morbidity and mortality. Please activate Code PE by paging [phone]. Critical Value/emergent results were called by telephone at the time of interpretation on 06/03/2016 at 12:12 pm to Dr. Tyrone Nine, who verbally acknowledged these results. Electronically Signed   By: Julian Hy M.D.   On: 06/03/2016 12:17    Eloise Levels, MD 06/05/2016, 7:21 AM PGY-1, [facility] Intern pager: [phone], text pages welcome

## 2016-06-05 NOTE — Progress Notes (Signed)
Patient ambulated about 150 feet in the hallway on room air, patient maintained an O2 sat above 95% during ambulation, will continue to monitor

## 2016-06-05 NOTE — Progress Notes (Signed)
Benefit check submitted for Eliquis. Will post result when available.

## 2016-06-08 ENCOUNTER — Inpatient Hospital Stay: Payer: 59 | Admitting: Family Medicine

## 2016-06-08 ENCOUNTER — Encounter: Payer: Self-pay | Admitting: Family Medicine

## 2016-06-08 ENCOUNTER — Other Ambulatory Visit: Payer: Self-pay | Admitting: *Deleted

## 2016-06-08 ENCOUNTER — Ambulatory Visit (INDEPENDENT_AMBULATORY_CARE_PROVIDER_SITE_OTHER): Payer: 59 | Admitting: Family Medicine

## 2016-06-08 VITALS — BP 110/80 | HR 87 | Temp 97.9°F | Wt 261.0 lb

## 2016-06-08 DIAGNOSIS — Z720 Tobacco use: Secondary | ICD-10-CM | POA: Diagnosis not present

## 2016-06-08 DIAGNOSIS — I2699 Other pulmonary embolism without acute cor pulmonale: Secondary | ICD-10-CM

## 2016-06-08 MED ORDER — NICOTINE 21 MG/24HR TD PT24: 21.0000 mg | MEDICATED_PATCH | Freq: Every day | TRANSDERMAL | 0 refills | Status: DC

## 2016-06-08 MED FILL — NICOTINE 21 MG/24HR PATCH: 21 | 14 days supply | Qty: 14 | Fill #0

## 2016-06-08 NOTE — Assessment & Plan Note (Signed)
Improving since hospital discharge and appears to be stable. Will place referral for both pulmonology and cardiology per inpatient team recommendations. Patient  Will continue Eliquis 10 mg twice a day 7 days (last dose on 06/10/16) and then 5 mg as twice a day thereafter.

## 2016-06-08 NOTE — Patient Instructions (Addendum)
It was good to meet you today!  For your pulmonary embolism,  - Please keep taking Eliquis 2 tablets (10 mg total) by mouth 2 (two) times daily until 06/10/16 and then on 06/11/16 start taking 1 tab (5mg  total) by mouth 2 times daily. - I have placed a referral to pulmonology for you to get a repeat CT chest in 6-8weeks.  - You should be hearing from cardiology about your outpatient stress test  For your efforts to quit smoking, you have done a wonderful job and please keep up the good work. - I have sent in a prescription for a 21mg  nicotine patch but it may only be available over the counter with out of pocket costs. - On your way out today, please make an appointment with Dr. Valentina Lucks for further help with smoking cessation. - 800-QUIT-NOW [phone]) is a good resource  Please make an appointment with Dr. Lorenso Courier to be seen in the next 6-8weeks.  Take care and seek immediate care sooner if you develop any concerns.   Dr. [name], [place]

## 2016-06-08 NOTE — Patient Outreach (Signed)
[facility]) Care Management  06/08/2016  JEZREEL JUSTINIANO [birth year] [id number]  Subjective: Telephone call to patient's home number, no answer, left HIPAA compliant voicemail message, and requested call back.   Objective: Per chart and [facility] point of care tool review, patient hospitalized 06/03/16 - 06/05/16 for bilateral pulmonary embolism.   Patient also has a history of hypertension and tobacco use.  Patient had hospital follow up appointment with primary MD on 06/08/16.    Assessment: Received [facility] Transition of care referral on 06/05/16 via [employer] report.   Transition of care follow up pending patient contact.   Plan: RNCM will call patient for 2nd telephone outreach attempt, transition of care follow up, within 10 business days if no return call.   Javone Ybanez H. Annia Friendly, BSN, [place] Management [facility] Telephonic CM Phone: [phone] Fax: [phone]

## 2016-06-08 NOTE — Assessment & Plan Note (Signed)
Patient appears to be extremely motivated to quit smoking. Has been able to cut down from 1 ppd to approximately 1-2 cigarettes per day. Has not been able to obtain nicotine patch so has significant cravings. Provided patient with prescription but also informed him that will likely need to purchase over the counter. Patient interested in meeting with Dr. Valentina Lucks at [facility] clinic about other options for NRT and for counseling. Given smoking cessation phone number today.

## 2016-06-08 NOTE — Progress Notes (Signed)
    Subjective:  Jeffrey [name] is a 51 y.o. male who presents to the [facility] today for hospital follow up.   HPI: Hospital follow up for acute PE - Patient was admitted from 06/03/2016 to 06/05/2016 for a submassive bilateral pulmonary embolism with right heart strain.  - Since leaving the hospital has been doing well. States breathing has greatly improved, used to become SOB walking to bathroom and today was able to make it to his mailbox. No chest pain, lightheadedness/dizziness, n/v, diaphoresis. - Has not yet heard from pulm about repeat CT chest or from cardiology for outpatient stress test. - Has been compliant on his Eliquis, taking 10mg  twice a day currently with plans to switch to 5mg  BID on 06/11/16. - Has been really struggling with smoking cessation. Was previously a 1 ppd smoker but  has only smoked 5-6 cigarettes total since leaving the hospital. Was not able to pick up his nicotine patch so is endorsing significant cravings. States has good social support.   ROS: Per HPI  Objective:  Physical Exam: BP 110/80   Pulse 87   Temp 97.9 F (36.6 C) (Oral)   Wt 261 lb (118.4 kg)   SpO2 92%   BMI 32.62 kg/m   Gen: NAD, resting comfortably CV: RRR with no murmurs appreciated Pulm: NWOB, CTAB with no crackles, wheezes, or rhonchi. GI: Normal bowel sounds present. Soft, Nontender, Nondistended. MSK: no edema, cyanosis, or clubbing noted Skin: warm, dry Neuro: grossly normal, moves all extremities Psych: Normal affect and thought content   Assessment/Plan:  Tobacco abuse Patient appears to be extremely motivated to quit smoking. Has been able to cut down from 1 ppd to approximately 1-2 cigarettes per day. Has not been able to obtain nicotine patch so has significant cravings. Provided patient with prescription but also informed him that will likely need to purchase over the counter. Patient interested in meeting with Dr. Valentina Lucks at [facility] clinic about other options for NRT and for  counseling. Given smoking cessation phone number today.  Pulmonary embolism ([place]) Improving since hospital discharge and appears to be stable. Will place referral for both pulmonology and cardiology per inpatient team recommendations. Patient  Will continue Eliquis 10 mg twice a day 7 days (last dose on 06/10/16) and then 5 mg as twice a day thereafter.  Patient to follow up with PCP in 6-8 weeks.  Jeffrey Lope, DO PGY-1, [facility] 06/08/2016 10:22 AM

## 2016-06-09 ENCOUNTER — Encounter: Payer: Self-pay | Admitting: *Deleted

## 2016-06-09 ENCOUNTER — Other Ambulatory Visit: Payer: Self-pay | Admitting: *Deleted

## 2016-06-09 NOTE — Patient Outreach (Addendum)
[facility]) Care Management  06/09/2016  Jeffrey [name] Apr 07, 1965 [id number]  Subjective: Telephone call to patient's home number, male answered phone, and states wrong number.  Telephone call to patient's mobile number, spoke with patient, and HIPAA verified.   Discussed [facility] Care Management [facility]MR Transition of care follow up, patient voiced understanding, and is in agreement to complete follow up.   Patient verified home and mobile number listed in chart is correct.   Patient states he is doing well, had follow up appointment with primary MD on 06/08/16, and following MD's instructions to the letter this time around.  States he has even stopped smoking.   RNCM encouraged patient to continue to great follow up with treatment plan and patient voiced understanding.  Discussed [facility] Employee / Dependents general benefits and resources.  RNCM educated patient on hospital indemnity supplemental insurance, patient voices understanding, and states he will ask his wife to follow up, and file claims if appropriate.  Patient states he is currently unemployed,  is not eligible for unemployment, or family medical leave act [employer]).   Patient states he does not have any transition of care, care coordination, disease management, disease monitoring, transportation, community resource, or pharmacy needs at this time.   States he is very appreciative of the follow up and is in agreement to receive [place] Management information.   Outpatient Encounter Prescriptions as of 06/09/2016  Medication Sig  . albuterol (PROVENTIL HFA;VENTOLIN HFA) 108 (90 Base) MCG/ACT inhaler Inhale 2 puffs into the lungs every 6 (six) hours as needed for wheezing or shortness of breath.  Marland Kitchen amLODipine (NORVASC) 10 MG tablet Take 1 tablet (10 mg total) by mouth daily.  Marland Kitchen apixaban (ELIQUIS) 5 MG TABS tablet Take 2 tablets (10 mg total) by mouth 2 (two) times daily. From 3/8/-3/14 and then 1 tab (5mg  total) by mouth 2 times  daily  . atorvastatin (LIPITOR) 40 MG tablet Take 1 tablet (40 mg total) by mouth daily.  . nicotine (NICODERM CQ - DOSED IN MG/24 HOURS) 21 mg/24hr patch Place 1 patch (21 mg total) onto the skin daily.  . traMADol (ULTRAM) 50 MG tablet Take 1 tablet (50 mg total) by mouth every 6 (six) hours as needed for moderate pain.   No facility-administered encounter medications on file as of 06/09/2016.     Objective: Per chart and [facility] point of care tool review, patient hospitalized 06/03/16 - 06/05/16 for bilateral pulmonary embolism.   Patient also has a history of hypertension and tobacco use.  Patient had hospital follow up appointment with primary MD on 06/08/16.    Assessment: Received UMR Transition of care referral on 06/05/16 via [employer] report.   Transition of care follow up completed, no care management needs, and will proceed with case closure.    Plan: RNCM will send patient successful outreach letter, [place] pamphlet, and magnet. RNCM will send case closure due to follow up completed / no care management needs request to [facility] [place] Management.    Sascha Baugher H. Annia Friendly, BSN, [facility] Telephonic CM Phone: [phone] Fax: [phone]

## 2016-06-10 ENCOUNTER — Institutional Professional Consult (permissible substitution): Payer: 59 | Admitting: Pulmonary Disease

## 2016-06-11 ENCOUNTER — Ambulatory Visit: Payer: 59 | Admitting: Pharmacist

## 2016-06-19 ENCOUNTER — Encounter: Payer: Self-pay | Admitting: Cardiology

## 2016-06-25 ENCOUNTER — Telehealth: Payer: Self-pay | Admitting: Family Medicine

## 2016-06-25 NOTE — Telephone Encounter (Signed)
Pt called and would like the doctor to write a letter addressed to Child Support stating that at this time he is not working because he is still recovering for the blood clots. This was he will not be penalized for not making his payments since he has no income at this time. Please call patient when ready to pick up. jw

## 2016-06-25 NOTE — Telephone Encounter (Signed)
I have never evaluated the patient so I cannot write a letter for him. Additionally, I am unsure if he is actually disabled and unable to work. Will forward to Dr. Shawna Orleans who saw him for the hospital follow up to determine 1) if he has no issues working, in which case he would not need a letter or 2) appeared like he couldn't work at their last visit- in that case he would need to be re-evaluated most likely to determine what he is capable of currently.  Thanks, Archie Patten, MD [facility] Resident  06/25/2016, 1:11 PM

## 2016-06-29 NOTE — Telephone Encounter (Signed)
Upon my exam from his hospital follow-up visit  I do not feel that he has issues that preclude him from working so I cannot write a letter for him at this time.

## 2016-06-30 ENCOUNTER — Ambulatory Visit: Payer: 59 | Admitting: Pharmacist

## 2016-07-02 ENCOUNTER — Ambulatory Visit: Payer: 59 | Admitting: Cardiology

## 2016-07-02 ENCOUNTER — Ambulatory Visit: Payer: 59 | Admitting: Pharmacist

## 2016-07-02 NOTE — Progress Notes (Deleted)
Cardiology Office Note    Date:  07/02/2016   ID:  Jeffrey [name], DOB 1965-11-07, MRN [id number]  PCP:  Jeffrey Cords, MD  Cardiologist:  Jeffrey Him, MD   No chief complaint on file.   History of Present Illness:  Jeffrey [name] is a 51 y.o. 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  Apparently initial EKG showed ? STEMI but Cardiology felt EKG was not c/w STEMI and c/w PE.  2D echo showed normal LVF with G1DD.  He was started on Eliquis and it was recommended that he have a cardiac ischemic workup as outpt.  He is now referred by his PCP, Jeffrey Cords, MD for further evaluation due to initial EKG changes.    Past Medical History:  Diagnosis Date  . DVT (deep venous thrombosis) ([place]) 11/28/2013   RT LEG  . Hypertension   . Marijuana use     Past Surgical History:  Procedure Laterality Date  . HERNIA REPAIR     2005 and 2011  . KNEE SURGERY Bilateral    Left knee 1993, right knee 2004    Current Medications: No outpatient prescriptions have been marked as taking for the 07/02/16 encounter (Appointment) with Jeffrey Margarita, MD.    Allergies:   Patient has no known allergies.   Social History   Social History  . Marital status: Married    Spouse name: N/A  . Number of children: N/A  . Years of education: N/A   Social History Main Topics  . Smoking status: Current Every Day Smoker    Packs/day: 0.50    Years: 37.00    Types: Cigarettes  . Smokeless tobacco: Never Used  . Alcohol use Yes     Comment: 1-2 drinks per event, few times per week  . Drug use: Yes    Types: Marijuana, Cocaine     Comment: Cocaine last used in 2004, current marijuana use  . Sexual activity: Yes    Birth control/ protection: None     Comment: With wife   Other Topics Concern  . Not on  file   Social History Narrative   Lives in [place].   Chickens as pet.    Hobbies: Basketball      Family History:  The patient's family history includes Alcohol abuse in his brother and mother; Arthritis in his father, mother, other, and other; Depression in his mother; Drug abuse in his brother; Heart disease in his mother, other, and other; Hypertension in his mother; Kidney disease in his mother; Sickle cell anemia in his brother.   ROS:   Please see the history of present illness.    ROS All other systems reviewed and are negative.  No flowsheet data found.     PHYSICAL EXAM:   VS:  There were no vitals taken for this visit.   GEN: Well nourished, well developed, in no acute distress  HEENT: normal  Neck: no JVD, carotid bruits, or masses Cardiac: RRR; no murmurs, rubs, or gallops,no edema.  Intact distal pulses bilaterally.  Respiratory:  clear to auscultation bilaterally, normal work of breathing GI: soft, nontender, nondistended, + BS MS: no deformity or atrophy  Skin: warm and dry, no rash Neuro:  Alert and Oriented x 3, Strength and sensation are intact Psych: euthymic mood, full  affect  Wt Readings from Last 3 Encounters:  06/08/16 261 lb (118.4 kg)  06/04/16 253 lb 9.6 oz (115 kg)  06/03/16 256 lb (116.1 kg)      Studies/Labs Reviewed:   EKG:  EKG is not ordered today.    Recent Labs: 06/03/2016: B Natriuretic Peptide 67.8 06/04/2016: ALT 15 06/05/2016: BUN 10; Creatinine, Ser 1.01; Hemoglobin 14.9; Platelets 124; Potassium 3.7; Sodium 136   Lipid Panel    Component Value Date/Time   CHOL 162 05/04/2013 1434   TRIG 142 05/04/2013 1434   HDL 46 05/04/2013 1434   CHOLHDL 3.5 05/04/2013 1434   VLDL 28 05/04/2013 1434   [place] 88 05/04/2013 1434    Additional studies/ records that were reviewed today include:  Hospital records    ASSESSMENT:    1. Other acute pulmonary embolism with acute cor pulmonale (HCC)   2. Essential hypertension   3.  Abnormal EKG      PLAN:  In order of problems listed above:  1. ***    Medication Adjustments/Labs and Tests Ordered: Current medicines are reviewed at length with the patient today.  Concerns regarding medicines are outlined above.  Medication changes, Labs and Tests ordered today are listed in the Patient Instructions below.  There are no Patient Instructions on file for this visit.   Signed, Jeffrey Him, MD  07/02/2016 8:10 AM    [facility] [place], [place], [place]  [address] Phone: [phone]; Fax: [phone]

## 2016-07-03 ENCOUNTER — Encounter: Payer: Self-pay | Admitting: Cardiology

## 2016-07-08 ENCOUNTER — Other Ambulatory Visit: Payer: Self-pay | Admitting: Family Medicine

## 2016-07-08 MED FILL — AMLODIPINE BESYLATE 10 MG T: 10 | 90 days supply | Qty: 90 | Fill #2

## 2016-07-09 MED FILL — ELIQUIS 5 MG TABLET: 5 | 30 days supply | Qty: 60 | Fill #1

## 2016-07-09 MED FILL — ATORVASTATIN 40 MG TABLET: 40 | 30 days supply | Qty: 30 | Fill #0

## 2016-07-09 NOTE — Telephone Encounter (Signed)
Pt is calling for a refill on his Lipitor to be called into his pharmacy. jw

## 2016-07-14 ENCOUNTER — Institutional Professional Consult (permissible substitution): Payer: 59 | Admitting: Internal Medicine

## 2016-07-22 ENCOUNTER — Encounter: Payer: Self-pay | Admitting: Pulmonary Disease

## 2016-07-22 ENCOUNTER — Ambulatory Visit (INDEPENDENT_AMBULATORY_CARE_PROVIDER_SITE_OTHER): Payer: 59 | Admitting: Pulmonary Disease

## 2016-07-22 VITALS — BP 120/90 | HR 77 | Ht 75.0 in | Wt 257.2 lb

## 2016-07-22 DIAGNOSIS — J439 Emphysema, unspecified: Secondary | ICD-10-CM

## 2016-07-22 NOTE — Progress Notes (Signed)
Subjective:    [name], male    DOB: [birth year], 51 y.o.   MRN: [id number]  C.C.:  Follow-up for Pulmonary Embolism with Acute Cor Pulmonale, Pulmonary Emphysema, Left Upper Lobe Opacity, & Tobacco Use Disorder.   HPI Pulmonary Embolism with Acute Cor Pulmonale:  Found on CT angiogram 06/03/16. Evaluated in hospital by our service. Recommended systemic anticoagulation but no role for thrombolytic therapy. Patient previously was on Xarelto for a right lower extremity DVT but missed a few doses prior to presentation. Initially patient required 4 L/m by nasal cannula.  Pulmonary Emphysema: Apical predominate and likely due to tobacco use.  Left Upper Lobe Opacity: Seen on CT imaging of his chest. Likely due to lung infarction in the setting of pulmonary embolism.   Tobacco Use Disorder:  Review of Systems   No Known Allergies  Current Outpatient Prescriptions on File Prior to Visit  Medication Sig Dispense Refill  . amLODipine (NORVASC) 10 MG tablet Take 1 tablet (10 mg total) by mouth daily. 90 tablet 3  . apixaban (ELIQUIS) 5 MG TABS tablet Take 2 tablets (10 mg total) by mouth 2 (two) times daily. From 3/8/-3/14 and then 1 tab (5mg  total) by mouth 2 times daily 60 tablet 3  . atorvastatin (LIPITOR) 40 MG tablet TAKE 1 TABLET BY MOUTH ONCE DAILY 30 tablet 0  . nicotine (NICODERM CQ - DOSED IN MG/24 HOURS) 21 mg/24hr patch Place 1 patch (21 mg total) onto the skin daily. 14 patch 0  . traMADol (ULTRAM) 50 MG tablet Take 1 tablet (50 mg total) by mouth every 6 (six) hours as needed for moderate pain. 30 tablet 0  . albuterol (PROVENTIL HFA;VENTOLIN HFA) 108 (90 Base) MCG/ACT inhaler Inhale 2 puffs into the lungs every 6 (six) hours as needed for wheezing or shortness of breath. (Patient not taking: Reported on 07/22/2016) 1 Inhaler 0  . atorvastatin (LIPITOR) 40 MG tablet Take 1 tablet (40 mg total) by mouth daily. (Patient not taking: Reported on 07/22/2016) 90 tablet 3    No current facility-administered medications on file prior to visit.     Past Medical History:  Diagnosis Date  . DVT (deep venous thrombosis) ([place]) 11/28/2013   RT LEG  . Hypertension   . Marijuana use     Past Surgical History:  Procedure Laterality Date  . HERNIA REPAIR     2005 and 2011  . KNEE SURGERY Bilateral    Left knee 1993, right knee 2004    Family History  Problem Relation Age of Onset  . Heart disease Other   . Arthritis Other   . Heart disease Other   . Arthritis Other   . Alcohol abuse Mother   . Heart disease Mother   . Depression Mother   . Hypertension Mother   . Kidney disease Mother   . Arthritis Mother   . Arthritis Father   . Alcohol abuse Brother   . Drug abuse Brother   . Sickle cell anemia Brother     Social History   Social History  . Marital status: Married    Spouse name: N/A  . Number of children: N/A  . Years of education: N/A   Social History Main Topics  . Smoking status: Current Every Day Smoker    Packs/day: 0.50    Years: 37.00    Types: Cigarettes  . Smokeless tobacco: Never Used     Comment: Currently smoking 3 cigarettes a day.   . Alcohol  use Yes     Comment: 1-2 drinks per event, few times per week  . Drug use: Yes    Types: Marijuana, Cocaine     Comment: Cocaine last used in 2004, current marijuana use  . Sexual activity: Yes    Birth control/ protection: None     Comment: With wife   Other Topics Concern  . None   Social History Narrative   Lives in [place].   Chickens as pet.    Hobbies: Basketball       Objective:   Physical Exam BP 120/90 (BP Location: Left Arm, Patient Position: Sitting, Cuff Size: Large)   Pulse 77   Ht 6\' 3"  (1.905 m)   Wt 257 lb 3.2 oz (116.7 kg)   SpO2 99%   BMI 32.15 kg/m  General:  Awake. Alert. No acute distress. Sitting watching TV. Family at bedside.  Integument:  Warm & dry. No rash on exposed skin. No bruising. Extremities:  No cyanosis or clubbing.   HEENT:  Moist mucus membranes. No oral ulcers. No scleral injection or icterus. Endotracheal tube in place. PERRL. Cardiovascular:  Regular rate. No edema. No appreciable JVD.  Pulmonary:  Good aeration & clear to auscultation bilaterally. Symmetric chest wall expansion. No accessory muscle use. Abdomen: Soft. Normal bowel sounds. Nondistended. Grossly nontender. Musculoskeletal:  Normal bulk and tone. Hand grip strength 5/5 bilaterally. No joint deformity or effusion appreciated.  IMAGING CTA CHEST 06/03/16 (personally reviewed by me): Pulmonary embolism involving distal main pulmonary arteries extending into all lobes. Clot burden mild to large with RV/LV ratio 1.59. Segmental consolidation with air bronchograms and associated groundglass consistent with infarction within the left upper lobe. Apical predominant emphysematous changes with some subpleural bleb formation particularly in the right lung apex. No pleural effusion or thickening. No pericardial effusion. No pathologic mediastinal adenopathy.  CARDIAC TTE (06/04/16): LV normal in size with moderate LVH. EF 55-60% with normal regional wall motion & grade 1 diastolic dysfunction. LA normal in size & RA moderately dilated. RV moderately dilated with mild reduction in systolic function. No aortic stenosis or regurgitation. Aortic root normal in size. No mitral stenosis or regurgitation. Flattening of the ventricular septum during systole consistent with RV pressure overload. No pulmonic stenosis. No tricuspid regurgitation. Pulmonary artery normal in size. No pericardial effusion.    Assessment & Plan:  51 y.o.  1. Acute pulmonary embolism with cor pulmonale: 2. Pulmonary emphysema: 3. Left upper lobe opacity: 4. Tobacco use disorder: 5. Health maintenance:   Patient left before I was able to evaluate or interview him.   Sonia Baller Ashok Cordia, M.D. [facility] Pager:  [phone] After 3pm or if no response, call  [phone] 4:17 PM 07/22/16

## 2016-07-27 ENCOUNTER — Encounter: Payer: Self-pay | Admitting: Family Medicine

## 2016-07-27 ENCOUNTER — Ambulatory Visit (INDEPENDENT_AMBULATORY_CARE_PROVIDER_SITE_OTHER): Payer: 59 | Admitting: Family Medicine

## 2016-07-27 VITALS — BP 130/92 | HR 79 | Temp 98.2°F | Ht 75.0 in | Wt 256.4 lb

## 2016-07-27 DIAGNOSIS — G8929 Other chronic pain: Secondary | ICD-10-CM

## 2016-07-27 DIAGNOSIS — M25562 Pain in left knee: Secondary | ICD-10-CM | POA: Diagnosis not present

## 2016-07-27 DIAGNOSIS — M25561 Pain in right knee: Secondary | ICD-10-CM

## 2016-07-27 DIAGNOSIS — M170 Bilateral primary osteoarthritis of knee: Secondary | ICD-10-CM

## 2016-07-27 DIAGNOSIS — M1712 Unilateral primary osteoarthritis, left knee: Secondary | ICD-10-CM | POA: Diagnosis not present

## 2016-07-27 DIAGNOSIS — I2699 Other pulmonary embolism without acute cor pulmonale: Secondary | ICD-10-CM | POA: Diagnosis not present

## 2016-07-27 DIAGNOSIS — Z1159 Encounter for screening for other viral diseases: Secondary | ICD-10-CM | POA: Diagnosis not present

## 2016-07-27 MED ORDER — METHYLPREDNISOLONE ACETATE 40 MG/ML IJ SUSP
40.0000 mg | Freq: Once | INTRAMUSCULAR | Status: AC
  Administered 2016-07-27: 40 mg via INTRAMUSCULAR

## 2016-07-27 MED ORDER — TRAMADOL HCL 50 MG PO TABS: 50.0000 mg | ORAL_TABLET | Freq: Four times a day (QID) | ORAL | 0 refills | Status: DC | PRN

## 2016-07-27 NOTE — Progress Notes (Signed)
Subjective: CC: knee pain HPI: Patient is a 51 y.o. male with a past medical history of degenerative changes of the knees, PE, DVTs presenting to clinic today for knee pain.   Patient endorsing pain in knees bilaterally, states it has been going on for years.   He has seen [facility] in the past and states he'd was told he'd need knee replacements. The R side bothers him the most. He cannot stand for long periods of time. He endorses popping and locking sensations. He's used knee braces in the past, but doesn't any more because his knees swell up and it becomes painful. He's been taking tramadol due to the pain but feels it is just covering up the problem. No fevers or chills.  Never been on disability but requesting a letter for child support agency stating he is unable to work. 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   As far as PE is concerned, no easy bruising or bleeding. Taking Eliquis regularly.   Social History: current smoker   ROS: All other systems reviewed and are negative.  Past Medical History Patient Active Problem List   Diagnosis Date Noted  . Chest pain   . Abnormal EKG   . Pulmonary embolism ([place]) 06/03/2016  . Degenerative arthritis of right knee 08/08/2015  . Encounter for chronic pain management 04/06/2014  . Tobacco abuse 12/05/2013  . DVT (deep venous thrombosis) ([place]) 11/28/2013  . Candidate for statin therapy due to risk of future cardiovascular event 05/05/2013  . HTN (hypertension) 05/04/2013  . Bilateral knee pain 05/04/2013  . Lumbar back pain 05/04/2013  . Healthcare maintenance 05/04/2013  . BMI 33.0-33.9,adult 05/04/2013  . Hx of substance abuse 05/04/2013    Medications- reviewed and updated Current Outpatient Prescriptions  Medication Sig Dispense Refill  . albuterol (PROVENTIL HFA;VENTOLIN HFA) 108 (90 Base) MCG/ACT inhaler Inhale 2 puffs into the lungs  every 6 (six) hours as needed for wheezing or shortness of breath. (Patient not taking: Reported on 07/22/2016) 1 Inhaler 0  . amLODipine (NORVASC) 10 MG tablet Take 1 tablet (10 mg total) by mouth daily. 90 tablet 3  . apixaban (ELIQUIS) 5 MG TABS tablet Take 2 tablets (10 mg total) by mouth 2 (two) times daily. From 3/8/-3/14 and then 1 tab (5mg  total) by mouth 2 times daily 60 tablet 3  . atorvastatin (LIPITOR) 40 MG tablet Take 1 tablet (40 mg total) by mouth daily. (Patient not taking: Reported on 07/22/2016) 90 tablet 3  . atorvastatin (LIPITOR) 40 MG tablet TAKE 1 TABLET BY MOUTH ONCE DAILY 30 tablet 0  . Multiple Vitamin (MULTIVITAMIN) tablet Take 1 tablet by mouth daily.    . nicotine (NICODERM CQ - DOSED IN MG/24 HOURS) 21 mg/24hr patch Place 1 patch (21 mg total) onto the skin daily. 14 patch 0  . traMADol (ULTRAM) 50 MG tablet Take 1 tablet (50 mg total) by mouth every 6 (six) hours as needed for moderate pain. 30 tablet 0   No current facility-administered medications for this visit.     Objective: Office vital signs reviewed. BP (!) 130/92   Pulse 79   Temp 98.2 F (36.8 C) (Oral)   Ht 6\' 3"  (1.905 m)   Wt 256 lb 6.4 oz (116.3 kg)   SpO2 98%   BMI 32.05 kg/m    Physical Examination:  General: Awake, alert, well- nourished, NAD Pulm: No increased WOB.  CTAB, without wheezes, rhonchi or crackles.  Cardio: RRR, no m/r/g noted.  Right knee: Normal to inspection without erythema, ecchymoses, effusion or obvious bony abnormalities.  No obvious Baker's cysts Palpation normal with no warmth or joint line tenderness or patellar tenderness or condyle tenderness.  No TTP along infrapatellar or pes anserine bursas.   ROM decreased in flexion (120 degrees) with terminal tenderness and pain. Normal extension (0 degrees).  Ligaments with solid consistent endpoints including ACL, PCL, LCL, MCL.  Negative Anterior Drawer/Lachman/Pivot Shift. Negative Mcmurray's. Negative Thessaly.  Non  painful patellar compression.  Normal Patellar glide.  No apprehension  Patellar and quadriceps tendons unremarkable. Hamstring and quadriceps strength is normal.  Left knee: Normal to inspection without erythema, ecchymoses, effusion or obvious bony abnormalities.  No obvious Baker's cysts Palpation with tenderness over the medial joint line. No TTP along infrapatellar or pes anserine bursas.   ROM decreased in flexion (125 degrees) and extension (0 degrees).  Ligaments with solid consistent endpoints including ACL, PCL, LCL, MCL.  Negative Anterior Drawer/Lachman/Pivot Shift. Negative Mcmurray's. Unable to test [place].  Non painful patellar compression.  Normal Patellar glide.  No apprehension  Patellar and quadriceps tendons unremarkable Hamstring and quadriceps strength is normal.    After informed written consent was obtained, patient was seated on exam table. Left knee was prepped with alcohol swab. Utilizing anteromedial approach, patient's left knee was injected intraarticularly with mixture of 1cc of depomedrol 40mg /mL and 3cc of 1% lidocaine. Patient tolerated the procedure well without immediate complications   Assessment/Plan: Pulmonary embolism ([place]) Doing well since hospitalization. No evidence of bleeding on exam or history. Continue Eliquis. CBC with diff today.   Bilateral knee pain Patient has "encounter for chronic pain management" under his problem list, however as his PCP, this is the first time he's been seen in over 1 year besides for his DVT/PE. I see a h/o substance abuse on his problem list, if possible would like to limit pain medications in his as much as possible. - left knee injection today. - referral back to [facility] as I feel they'd be more apt to determine his ability to work for documentation purposes. - tramadol #30 to last 30 days - pt to return next week for R knee injection if desired. - if it takes too long for him to get in with ortho,  consider getting repeat imaging (as last imaging in our system is from 2015).    Orders Placed This Encounter  Procedures  . CBC with Differential/Platelet  . Hepatitis C antibody  . Ambulatory referral to Orthopedic Surgery    Referral Priority:   Routine    Referral Type:   Surgical    Referral Reason:   Specialty Services Required    Requested Specialty:   Orthopedic Surgery    Number of Visits Requested:   1    Meds ordered this encounter  Medications  . traMADol (ULTRAM) 50 MG tablet    Sig: Take 1 tablet (50 mg total) by mouth every 6 (six) hours as needed for moderate pain.    Dispense:  30 tablet    Refill:  [facility] PGY-3, [place]

## 2016-07-27 NOTE — Assessment & Plan Note (Signed)
Doing well since hospitalization. No evidence of bleeding on exam or history. Continue Eliquis. CBC with diff today.

## 2016-07-27 NOTE — Patient Instructions (Addendum)
I have placed a referral back to [facility].  I have refilled tramadol as needed for break through pain.  If you note significant pain in the right knee and it will be a while until you can see the orthopedist, follow up in my clinic for an injection.

## 2016-07-27 NOTE — Assessment & Plan Note (Signed)
Patient has "encounter for chronic pain management" under his problem list, however as his PCP, this is the first time he's been seen in over 1 year besides for his DVT/PE. I see a h/o substance abuse on his problem list, if possible would like to limit pain medications in his as much as possible. - left knee injection today. - referral back to [facility] as I feel they'd be more apt to determine his ability to work for documentation purposes. - tramadol #30 to last 30 days - pt to return next week for R knee injection if desired. - if it takes too long for him to get in with ortho, consider getting repeat imaging (as last imaging in our system is from 2015).

## 2016-07-28 ENCOUNTER — Ambulatory Visit (INDEPENDENT_AMBULATORY_CARE_PROVIDER_SITE_OTHER): Payer: 59 | Admitting: Orthopaedic Surgery

## 2016-07-28 ENCOUNTER — Ambulatory Visit (INDEPENDENT_AMBULATORY_CARE_PROVIDER_SITE_OTHER): Payer: Self-pay

## 2016-07-28 ENCOUNTER — Encounter: Payer: Self-pay | Admitting: Family Medicine

## 2016-07-28 ENCOUNTER — Encounter (INDEPENDENT_AMBULATORY_CARE_PROVIDER_SITE_OTHER): Payer: Self-pay | Admitting: Orthopaedic Surgery

## 2016-07-28 ENCOUNTER — Ambulatory Visit (INDEPENDENT_AMBULATORY_CARE_PROVIDER_SITE_OTHER): Payer: 59

## 2016-07-28 DIAGNOSIS — M1711 Unilateral primary osteoarthritis, right knee: Secondary | ICD-10-CM

## 2016-07-28 DIAGNOSIS — M1712 Unilateral primary osteoarthritis, left knee: Secondary | ICD-10-CM

## 2016-07-28 LAB — CBC WITH DIFFERENTIAL/PLATELET
Basophils Absolute: 0 10*3/uL (ref 0.0–0.2)
Basos: 1 %
EOS (ABSOLUTE): 0.1 10*3/uL (ref 0.0–0.4)
EOS: 2 %
HEMATOCRIT: 45.4 % (ref 37.5–51.0)
HEMOGLOBIN: 15.2 g/dL (ref 13.0–17.7)
IMMATURE GRANULOCYTES: 0 %
Immature Grans (Abs): 0 10*3/uL (ref 0.0–0.1)
LYMPHS: 38 %
Lymphocytes Absolute: 2.2 10*3/uL (ref 0.7–3.1)
MCH: 29.6 pg (ref 26.6–33.0)
MCHC: 33.5 g/dL (ref 31.5–35.7)
MCV: 88 fL (ref 79–97)
MONOCYTES: 10 %
MONOS ABS: 0.6 10*3/uL (ref 0.1–0.9)
NEUTROS PCT: 49 %
Neutrophils Absolute: 2.8 10*3/uL (ref 1.4–7.0)
Platelets: 226 10*3/uL (ref 150–379)
RBC: 5.14 x10E6/uL (ref 4.14–5.80)
RDW: 14.4 % (ref 12.3–15.4)
WBC: 5.7 10*3/uL (ref 3.4–10.8)

## 2016-07-28 LAB — HEPATITIS C ANTIBODY: Hep C Virus Ab: <0.1 s/co ratio (ref 0.0–0.9)

## 2016-07-28 MED FILL — traMADol HCL 50 MG TABS: 50 | 7 days supply | Qty: 30 | Fill #0

## 2016-07-28 NOTE — Progress Notes (Signed)
Office Visit Note   Patient: Jeffrey [name]           Date of Birth: June 16, 1965           MRN: [id number] Visit Date: 07/28/2016              Requested by: Archie Patten, MD [address], [place] [address] PCP: Kathrine Cords, MD   Assessment & Plan: Visit Diagnoses:  1. Primary osteoarthritis of right knee   2. Primary osteoarthritis of left knee     Plan: I reviewed the x-rays with Jeffrey [name]. His left knee has severe degenerative joint disease with multiple intra-articular loose bodies. He would like to proceed with a total knee replacement. He does understand that he is at risk for a DVT and/or PE given his history and the nature of the surgery. He will need to be off of Eliquis for 2-3 days preoperatively and we can resume this after surgery. His smoking is down to about 5 cigarettes a day which I think is reasonable. He understands other risks are infection, stiffness, incomplete relief of pain, need for additional surgery. My plan is to perform a uncemented knee replacement given his age and activity level. Questions encouraged and answered. He did just giving injection yesterday which we will have to wait at least 6 weeks before performing the knee replacement. I would like to get official recommendations from Dr. Lorenso [name] on when it is safe to come off of the Eliquis preoperatively.    Follow-Up Instructions: Return if symptoms worsen or fail to improve.   Orders:  Orders Placed This Encounter  Procedures  . XR KNEE 3 VIEW RIGHT  . XR KNEE 3 VIEW LEFT   No orders of the defined types were placed in this encounter.     Procedures: No procedures performed   Clinical Data: No additional findings.   Subjective: Chief Complaint  Patient presents with  . Left Knee - Pain  . Right Knee - Pain    Patient is a very pleasant 51 year old gentleman who comes in with severe bilateral knee pain worse on the left knee. He has severe difficulty with ADLs and  with standing or walking for long periods of time. He is not able to kneel down because of severe pain. He has pain throughout his knee that is constant. He's had multiple cortisone injections with temporary and partial relief. He is taken tramadol with occasional relief. Ice and he only helped temporarily. He comes in today to consider knee replacement. He does have a history of bilateral DVTs and a PE that was just diagnosed about 2 months ago. He is currently on Eliquis.    Review of Systems  Constitutional: Negative.   All other systems reviewed and are negative.    Objective: Vital Signs: There were no vitals taken for this visit.  Physical Exam  Constitutional: He is oriented to person, place, and time. He appears well-developed and well-nourished.  HENT:  Head: Normocephalic and atraumatic.  Eyes: Pupils are equal, round, and reactive to light.  Neck: Neck supple.  Pulmonary/Chest: Effort normal.  Abdominal: Soft.  Musculoskeletal: Normal range of motion.  Neurological: He is alert and oriented to person, place, and time.  Skin: Skin is warm.  Psychiatric: He has a normal mood and affect. His behavior is normal. Judgment and thought content normal.  Nursing note and vitals reviewed.   Ortho Exam Bilateral knee exam shows no joint effusion. He has painful range  of motion. Collaterals and cruciates are stable. Positive patellar crepitus. Catching pain with range of motion. Specialty Comments:  No specialty comments available.  Imaging: No results found.   PMFS History: Patient Active Problem List   Diagnosis Date Noted  . Primary osteoarthritis of left knee 07/28/2016  . Chest pain   . Abnormal EKG   . Pulmonary embolism ([place]) 06/03/2016  . Degenerative arthritis of right knee 08/08/2015  . Encounter for chronic pain management 04/06/2014  . Tobacco abuse 12/05/2013  . DVT (deep venous thrombosis) ([place]) 11/28/2013  . Candidate for statin therapy due to risk of  future cardiovascular event 05/05/2013  . HTN (hypertension) 05/04/2013  . Bilateral knee pain 05/04/2013  . Lumbar back pain 05/04/2013  . Healthcare maintenance 05/04/2013  . BMI 33.0-33.9,adult 05/04/2013  . Hx of substance abuse 05/04/2013   Past Medical History:  Diagnosis Date  . DVT (deep venous thrombosis) ([place]) 11/28/2013   RT LEG  . Hypertension   . Marijuana use     Family History  Problem Relation Age of Onset  . Heart disease Other   . Arthritis Other   . Heart disease Other   . Arthritis Other   . Alcohol abuse Mother   . Heart disease Mother   . Depression Mother   . Hypertension Mother   . Kidney disease Mother   . Arthritis Mother   . Arthritis Father   . Alcohol abuse Brother   . Drug abuse Brother   . Sickle cell anemia Brother     Past Surgical History:  Procedure Laterality Date  . HERNIA REPAIR     2005 and 2011  . KNEE SURGERY Bilateral    Left knee 1993, right knee 2004   Social History   Occupational History  . Not on file.   Social History Main Topics  . Smoking status: Current Every Day Smoker    Packs/day: 0.50    Years: 37.00    Types: Cigarettes  . Smokeless tobacco: Never Used     Comment: Currently smoking 3 cigarettes a day.   . Alcohol use Yes     Comment: 1-2 drinks per event, few times per week  . Drug use: Yes    Types: Marijuana, Cocaine     Comment: Cocaine last used in 2004, current marijuana use  . Sexual activity: Yes    Birth control/ protection: None     Comment: With wife

## 2016-08-04 ENCOUNTER — Inpatient Hospital Stay: Payer: 59 | Admitting: Adult Health

## 2016-08-05 ENCOUNTER — Ambulatory Visit (INDEPENDENT_AMBULATORY_CARE_PROVIDER_SITE_OTHER): Payer: 59 | Admitting: Cardiology

## 2016-08-05 ENCOUNTER — Encounter: Payer: Self-pay | Admitting: Cardiology

## 2016-08-05 VITALS — BP 140/80 | HR 78 | Ht 75.0 in | Wt 252.1 lb

## 2016-08-05 DIAGNOSIS — I2699 Other pulmonary embolism without acute cor pulmonale: Secondary | ICD-10-CM | POA: Insufficient documentation

## 2016-08-05 DIAGNOSIS — I10 Essential (primary) hypertension: Secondary | ICD-10-CM

## 2016-08-05 DIAGNOSIS — I2609 Other pulmonary embolism with acute cor pulmonale: Secondary | ICD-10-CM

## 2016-08-05 DIAGNOSIS — R079 Chest pain, unspecified: Secondary | ICD-10-CM | POA: Diagnosis not present

## 2016-08-05 NOTE — Progress Notes (Signed)
Cardiology Office Note    Date:  08/07/2016   ID:  KRISTOFER SCHAFFERT, DOB [birth year], MRN [id number]  PCP:  Archie Patten, MD  Cardiologist:  Fransico Him, MD   Chief Complaint  Patient presents with  . New Evaluation    abnormal EKG, CP and recent acute PE    History of Present Illness:  Jeffrey [name] is a 51 y.o. male who is being seen today for the evaluation of chest pain at the request of [name], DO.  The patient presented to [facility]2016 with increasing fatigue, SOB and sharp pleuritic CP after missing 3-4 doses of Xarelto (for DVT) over that month and CTA showed massive bilateral PEs extending into all lobes with right heart strain. Initial EKG worrisome for STEMI but Interventional cardiologist felt it was not as STEMI and repeat EKG showed worsening strain with new T wave inversions and felt due to PE.  2D echo was done showing acute RV strain with mildly dilated RV with mild RV dysfunction.  He was discharged home and instructed to followup with Cardiology for stress test.  He is doing well today.  He denies any chest pain or pressure.  He still has SOB but it continues to improve.  He denies any LE edema, dizziness, palpitations or syncope.      Past Medical History:  Diagnosis Date  . Acute pulmonary embolus (HCC)    bilateral submassive PE in setting of missing several doses of Xarelto for his DVT  . DVT (deep venous thrombosis) ([place]) 11/28/2013   RT LEG  . Hypertension   . Marijuana use     Past Surgical History:  Procedure Laterality Date  . HERNIA REPAIR     2005 and 2011  . KNEE SURGERY Bilateral    Left knee 1993, right knee 2004    Current Medications: No outpatient prescriptions have been marked as taking for the 08/05/16 encounter (Office Visit) with Sueanne Margarita, MD.    Allergies:   Patient has no known allergies.   Social History   Social History  . Marital status: Married    Spouse name: N/A  . Number of children: N/A  . Years of  education: N/A   Social History Main Topics  . Smoking status: Current Every Day Smoker    Packs/day: 0.50    Years: 37.00    Types: Cigarettes  . Smokeless tobacco: Never Used     Comment: Currently smoking 3 cigarettes a day.   . Alcohol use Yes     Comment: 1-2 drinks per event, few times per week  . Drug use: Yes    Types: Marijuana, Cocaine     Comment: Cocaine last used in 2004, current marijuana use  . Sexual activity: Yes    Birth control/ protection: None     Comment: With wife   Other Topics Concern  . None   Social History Narrative   Lives in [place].   Chickens as pet.    Hobbies: Basketball      Family History:  The patient's family history includes Alcohol abuse in his brother and mother; Arthritis in his father, mother, other, and other; Depression in his mother; Drug abuse in his brother; Heart disease in his mother, other, and other; Hypertension in his mother; Kidney disease in his mother; Sickle cell anemia in his brother.   ROS:   Please see the history of present illness.    ROS All other systems reviewed and  are negative.  No flowsheet data found.     PHYSICAL EXAM:   VS:  BP 140/80   Pulse 78   Ht 6\' 3"  (1.905 m)   Wt 252 lb 1.9 oz (114.4 kg)   BMI 31.51 kg/m    GEN: Well nourished, well developed, in no acute distress  HEENT: normal  Neck: no JVD, carotid bruits, or masses Cardiac: RRR; no murmurs, rubs, or gallops,no edema.  Intact distal pulses bilaterally.  Respiratory:  clear to auscultation bilaterally, normal work of breathing GI: soft, nontender, nondistended, + BS MS: no deformity or atrophy  Skin: warm and dry, no rash Neuro:  Alert and Oriented x 3, Strength and sensation are intact Psych: euthymic mood, full affect  Wt Readings from Last 3 Encounters:  08/06/16 256 lb 9.6 oz (116.4 kg)  08/05/16 252 lb 1.9 oz (114.4 kg)  07/27/16 256 lb 6.4 oz (116.3 kg)      Studies/Labs Reviewed:   EKG:  EKG is not ordered  today.   Recent Labs: 06/03/2016: B Natriuretic Peptide 67.8 06/04/2016: ALT 15 06/05/2016: BUN 10; Creatinine, Ser 1.01; Hemoglobin 14.9; Potassium 3.7; Sodium 136 07/27/2016: Platelets 226   Lipid Panel    Component Value Date/Time   CHOL 162 05/04/2013 1434   TRIG 142 05/04/2013 1434   HDL 46 05/04/2013 1434   CHOLHDL 3.5 05/04/2013 1434   VLDL 28 05/04/2013 1434   [place] 88 05/04/2013 1434    Additional studies/ records that were reviewed today include:  Hospital notes, 2D echo    ASSESSMENT:    1. Chest pain, unspecified type   2. Essential hypertension   3. Other acute pulmonary embolism with acute cor pulmonale (HCC)      PLAN:  In order of problems listed above:  1. Pleuritic CP in the setting of acute bilateral submassive PEs - CP was pleuritic and likely related to acute PE.  His CP has resolved after treatment of PE.  EKG on admit showed diffuse ST abnormality c/w acute RV strain.  2D echo was done showing normal LVF with EF 10-17%, systolic flattening of the IVS c/w RV pressure overload and mildly dilated RV with mild RV dysfunction.  PASP could not be assessed.  I will repeat echo to see if RVF has improved.   2. Abnormal EKG - deep anterior T wave inversions likely related to acute RV strain in setting of acute PE in March.  I will repeat EKG to see if this has resolved.  I will also get a coronary CTA with morphology and calcium score to rule out CAD.  3. Acute bilateral submassive PEs in March 2018 now on Xarelto.  Again stressed the importance of being compliant with his anticoagulation.     Medication Adjustments/Labs and Tests Ordered: Current medicines are reviewed at length with the patient today.  Concerns regarding medicines are outlined above.  Medication changes, Labs and Tests ordered today are listed in the Patient Instructions below.  Patient Instructions  Medication Instructions:  Your physician recommends that you continue on your current  medications as directed. Please refer to the Current Medication list given to you today.   Labwork: None  Testing/Procedures: Your physician has requested that you have an exercise tolerance test. For further information please visit [email or web address]. Please also follow instruction sheet, as given.  Follow-Up: Your physician recommends that you schedule a follow-up appointment AS NEEDED with Dr. Radford Pax pending study results.  Any Other Special Instructions Will Be Listed Below (If Applicable).  If you need a refill on your cardiac medications before your next appointment, please call your pharmacy.      Signed, Fransico Him, MD  08/07/2016 4:23 PM    [address], [place], [place]  [address] Phone: [phone]; Fax: [phone]

## 2016-08-05 NOTE — Patient Instructions (Signed)
Medication Instructions:  Your physician recommends that you continue on your current medications as directed. Please refer to the Current Medication list given to you today.   Labwork: None  Testing/Procedures: Your physician has requested that you have an exercise tolerance test. For further information please visit [email or web address]. Please also follow instruction sheet, as given.  Follow-Up: Your physician recommends that you schedule a follow-up appointment AS NEEDED with Dr. Radford Pax pending study results.  Any Other Special Instructions Will Be Listed Below (If Applicable).     If you need a refill on your cardiac medications before your next appointment, please call your pharmacy.

## 2016-08-06 ENCOUNTER — Ambulatory Visit (INDEPENDENT_AMBULATORY_CARE_PROVIDER_SITE_OTHER): Payer: 59 | Admitting: Family Medicine

## 2016-08-06 ENCOUNTER — Telehealth: Payer: Self-pay | Admitting: *Deleted

## 2016-08-06 ENCOUNTER — Encounter: Payer: Self-pay | Admitting: Family Medicine

## 2016-08-06 VITALS — BP 140/88 | HR 88 | Temp 98.2°F | Ht 75.0 in | Wt 256.6 lb

## 2016-08-06 DIAGNOSIS — M1711 Unilateral primary osteoarthritis, right knee: Secondary | ICD-10-CM

## 2016-08-06 DIAGNOSIS — M1712 Unilateral primary osteoarthritis, left knee: Secondary | ICD-10-CM

## 2016-08-06 DIAGNOSIS — I2609 Other pulmonary embolism with acute cor pulmonale: Secondary | ICD-10-CM

## 2016-08-06 MED ORDER — METHYLPREDNISOLONE ACETATE 40 MG/ML IJ SUSP
40.0000 mg | Freq: Once | INTRAMUSCULAR | Status: AC
  Administered 2016-08-06: 40 mg via INTRAMUSCULAR

## 2016-08-06 NOTE — Assessment & Plan Note (Signed)
Doing well since hospital discharge. - pt will need pulmonology clearance for surgery given recent PE, especially as this would put him at increased risk of clots and he'd have to be off Eliquis prior to the procedure-- he has not had the f/u CT that was recommended on discharge either. - pt will also need cardiology clearance as well given R heart strain. He states he has a stress test scheduled already.

## 2016-08-06 NOTE — Patient Instructions (Signed)
You had an injection in the right knee today.  For the subsequent 24 hours, it can feel uncomfortable. Hopefully you will get some benefit from this injection.  Follow up with the lung doctor and cardiologist, this is imperative to get surgery clearance.

## 2016-08-06 NOTE — Progress Notes (Signed)
Subjective: CC: knee pain HPI: Patient is a 51 y.o. male with a past medical history of degenerative changes of the knees, PE, DVTs presenting to clinic today for knee pain.   At last visit he had his L knee injection. He got no relief from the injection. He would like to try a steroid shot on the R side today. He cannot stand for long periods of time. He endorses popping and locking sensations. He now has a brace on the L knee. Taking tramadol intermittently.  He has seen Dr. Erlinda Hong with orthopedics who recommends a TKA on the L due to severe degenerative changes.  He would like clearance for surgery. He denies SOB, chest pain, palpitations. Denies issues with anesthesia in the past.  Of note the patient had a submassive PE around 2 months ago with right sided heart strain. He was supposed to follow up with pulmonology to assess the stability of the clot but has not due to scheduling issues. He was also supposed to f/u with Dr. Radford Pax with cardiology for a stress test; he states this is scheduled for later in the month. He is taking Eliquis.    Social History: current smoker but down to 3 cigarettes per day.   ROS: All other systems reviewed and are negative.  Past Medical History Patient Active Problem List   Diagnosis Date Noted  . Acute pulmonary embolus ([place])   . Primary osteoarthritis of left knee 07/28/2016  . Chest pain   . Abnormal EKG   . Pulmonary embolism ([place]) 06/03/2016  . Degenerative arthritis of right knee 08/08/2015  . Encounter for chronic pain management 04/06/2014  . Tobacco abuse 12/05/2013  . DVT (deep venous thrombosis) ([place]) 11/28/2013  . Candidate for statin therapy due to risk of future cardiovascular event 05/05/2013  . HTN (hypertension) 05/04/2013  . Bilateral knee pain 05/04/2013  . Lumbar back pain 05/04/2013  . Healthcare maintenance 05/04/2013  . BMI 33.0-33.9,adult 05/04/2013  . Hx of substance abuse 05/04/2013    Medications- reviewed  and updated Current Outpatient Prescriptions  Medication Sig Dispense Refill  . amLODipine (NORVASC) 10 MG tablet Take 1 tablet (10 mg total) by mouth daily. 90 tablet 3  . apixaban (ELIQUIS) 5 MG TABS tablet Take 2 tablets (10 mg total) by mouth 2 (two) times daily. From 3/8/-3/14 and then 1 tab (5mg  total) by mouth 2 times daily 60 tablet 3  . atorvastatin (LIPITOR) 40 MG tablet TAKE 1 TABLET BY MOUTH ONCE DAILY 30 tablet 0  . nicotine (NICODERM CQ - DOSED IN MG/24 HOURS) 21 mg/24hr patch Place 1 patch (21 mg total) onto the skin daily. 14 patch 0  . traMADol (ULTRAM) 50 MG tablet Take 1 tablet (50 mg total) by mouth every 6 (six) hours as needed for moderate pain. 30 tablet 0   No current facility-administered medications for this visit.     Objective: Office vital signs reviewed. BP 140/88   Pulse 88   Temp 98.2 F (36.8 C) (Oral)   Ht 6\' 3"  (1.905 m)   Wt 256 lb 9.6 oz (116.4 kg)   SpO2 96%   BMI 32.07 kg/m    Physical Examination:  General: Awake, alert, well- nourished, NAD Pulm: No increased WOB.  CTAB, without wheezes, rhonchi or crackles.  Cardio: RRR, no m/r/g noted.  Right knee: Normal to inspection without erythema, ecchymoses, effusion or obvious bony abnormalities.  No obvious Baker's cysts Medial and lateral joint line tenderness.   No TTP  along infrapatellar or pes anserine bursas.   ROM decreased in flexion (120 degrees) with terminal tenderness and pain. Normal extension (0 degrees).  Ligaments with solid consistent endpoints including ACL, PCL, LCL, MCL.    Left knee: in a brace. Significant crepitus with flexion and extension.     Assessment/Plan: Pulmonary embolism ([place]) Doing well since hospital discharge. - pt will need pulmonology clearance for surgery given recent PE, especially as this would put him at increased risk of clots and he'd have to be off Eliquis prior to the procedure-- he has not had the f/u CT that was recommended on discharge either. -  pt will also need cardiology clearance as well given R heart strain. He states he has a stress test scheduled already.  Primary osteoarthritis of left knee Noted to have severe degenerative changes. Patient will ultimately require TKA per ortho. No benefit from recent injection however this is not surprising given the amount of arthritic change. Per NSQIP, he is at above risk for serious complications, VTE, cardiac complications, among other things.  - given his significant comorbidities, he would need to get cardiology and pulmonology clearance for the procedure. This was noted on the form requested by the ortho. - pt continues to smoke, discussed importance of cessation prior to the procedure (whenever that may be).   Degenerative arthritis of right knee After informed written consent was obtained, patient was seated on exam table. Right knee was prepped with alcohol swab. Utilizing anterolateral approach, patient's left knee was injected intraarticularly with mixture of 1cc of depomedrol 40mg /mL and 3cc of 1% lidocaine. Patient tolerated the procedure well without immediate complications   No orders of the defined types were placed in this encounter.   Meds ordered this encounter  Medications  . methylPREDNISolone acetate (DEPO-MEDROL) injection 40 mg    Archie Patten PGY-3, [place]

## 2016-08-06 NOTE — Assessment & Plan Note (Signed)
Noted to have severe degenerative changes. Patient will ultimately require TKA per ortho. No benefit from recent injection however this is not surprising given the amount of arthritic change. Per NSQIP, he is at above risk for serious complications, VTE, cardiac complications, among other things.  - given his significant comorbidities, he would need to get cardiology and pulmonology clearance for the procedure. This was noted on the form requested by the ortho. - pt continues to smoke, discussed importance of cessation prior to the procedure (whenever that may be).

## 2016-08-06 NOTE — Assessment & Plan Note (Signed)
After informed written consent was obtained, patient was seated on exam table. Right knee was prepped with alcohol swab. Utilizing anterolateral approach, patient's left knee was injected intraarticularly with mixture of 1cc of depomedrol 40mg /mL and 3cc of 1% lidocaine. Patient tolerated the procedure well without immediate complications

## 2016-08-06 NOTE — Telephone Encounter (Signed)
Patient called to inform PCP that his appointment with pulmonologist on Aug 18, 2016 at 11:45 AM.  Derl Barrow, RN

## 2016-08-07 ENCOUNTER — Telehealth: Payer: Self-pay

## 2016-08-07 DIAGNOSIS — R079 Chest pain, unspecified: Secondary | ICD-10-CM

## 2016-08-07 DIAGNOSIS — R9431 Abnormal electrocardiogram [ECG] [EKG]: Secondary | ICD-10-CM

## 2016-08-07 NOTE — Telephone Encounter (Signed)
Dr. Radford Pax, patient to have coronary CT and echo and NOT regular GXT. Called patient, cancelled GXT and reviewed new recommendations. He understands CT will not be done until June when the new scanner is running.  In the meantime, the patient requests a note from Dr. Radford Pax excusing him from work until everything is figured out.  To Dr. Radford Pax for recommendations.

## 2016-08-09 NOTE — Telephone Encounter (Signed)
No note needed as he voiced that his chest pain had resolved.  The coronary CTA is just to assess the elevated trop noted at time of hospitalization for PE.  If he wants work note he will need to get from pulmonary or his PCP.  The CP he had in hospital was pleuritic and c/w PE.  I do not want to do a stress test so close to having submassive PE so Coronary CTA is the safest study to assess coronary Ca score and CAD

## 2016-08-10 NOTE — Telephone Encounter (Signed)
DPR form, left message for patient to contact PCP or pulmonary MD for work note.  Instructed him to call with any other questions or concerns.

## 2016-08-11 ENCOUNTER — Other Ambulatory Visit: Payer: Self-pay | Admitting: Family Medicine

## 2016-08-11 ENCOUNTER — Telehealth: Payer: Self-pay | Admitting: Cardiology

## 2016-08-11 MED FILL — ATORVASTATIN 40 MG TABLET: 40 | 30 days supply | Qty: 30 | Fill #0

## 2016-08-11 MED FILL — ELIQUIS 5 MG TABLET: 5 | 30 days supply | Qty: 60 | Fill #2

## 2016-08-11 NOTE — Telephone Encounter (Signed)
New Message     Pt wants to know if he still needs to come in for the Stress test tomorrow

## 2016-08-11 NOTE — Telephone Encounter (Signed)
Jeffrey Margarita, MD  You 2 days ago     No note needed as he voiced that his chest pain had resolved.  The coronary CTA is just to assess the elevated trop noted at time of hospitalization for PE.  If he wants work note he will need to get from pulmonary or his PCP.  The CP he had in hospital was pleuritic and c/w PE.  I do not want to do a stress test so close to having submassive PE so Coronary CTA is the safest study to assess coronary Ca score and CAD      Reiterated to patient that stress test will be cancelled and instructed him NOT to come to office tomorrow. He was grateful for call.

## 2016-08-17 ENCOUNTER — Telehealth: Payer: Self-pay

## 2016-08-17 NOTE — Telephone Encounter (Signed)
Patient had bilateral submassive PEs 3 months ago so cannot undergo knee surgery for at least 6 months and will have to be cleared through his pulmonologist and PCP.  OK to wait until new CT scanner up to get coronary CTA

## 2016-08-17 NOTE — Telephone Encounter (Signed)
-----   Message from Sueanne Margarita, MD sent at 08/17/2016 12:02 PM EDT ----- Regarding: RE: cardiac CT Lets start with noncontrasted chest CT for calcium score  Traci ----- Message ----- From: Theodoro Parma, RN Sent: 08/17/2016   9:25 AM To: Sueanne Margarita, MD Subject: FW: cardiac CT                                 Please see below. The new CT scanner won't be available for use for another 5-6 weeks. This patient needs a CT for surgical clearance for procedure sooner than the CT is available. What else can we do? Thanks! ----- Message ----- From: Armando Gang Sent: 08/17/2016   9:22 AM To: Armando Gang, Theodoro Parma, RN Subject: RE: cardiac CT                                 Valetta Fuller from the last report I received the scanner will not be up and running not til the last week of June or 1st week of July.  ----- Message ----- From: Lillia Pauls Sent: 08/10/2016   8:13 AM To: Armando Gang Subject: FW: cardiac CT                                 Mc umr no pac rqd ----- Message ----- From: Theodoro Parma, RN Sent: 08/07/2016   6:55 PM To: Armando Gang, [facility] Pre Cert/Auth Subject: cardiac CT                                     Cardiac CT ordered for precert.   Ivin Booty - can wait to new scanner, BUT will need to be done ASAP if possible (this is for knee replacement surgery scheduled the second week of June)  Thanks!

## 2016-08-17 NOTE — Telephone Encounter (Signed)
Patient states he cannot afford the $150 needed at check-in for study. He states he is willing to do other tests that are run through insurance.  To Dr. Radford Pax for recommendations.

## 2016-08-18 ENCOUNTER — Inpatient Hospital Stay: Payer: 59 | Admitting: Adult Health

## 2016-08-20 NOTE — Telephone Encounter (Signed)
Informed patient he cannot undergo knee surgery for at least 6 months after his PEs. He understands he will need to be cleared for surgery by both Pulmonary and PCP.  Patient understands he will be called to schedule CT when new scanner is available. He was grateful for call.

## 2016-09-01 ENCOUNTER — Telehealth: Payer: Self-pay | Admitting: Cardiology

## 2016-09-01 NOTE — Telephone Encounter (Signed)
Follow Up:    Pt says he is returning a call from last Wednesday,he said his phone was off. He says he does not know who called,he thought it might have been you.

## 2016-09-01 NOTE — Telephone Encounter (Signed)
Informed patient there is no documentation of who called, but if it was important they would call back. He was grateful for follow-up.

## 2016-09-04 ENCOUNTER — Institutional Professional Consult (permissible substitution): Payer: 59 | Admitting: Pulmonary Disease

## 2016-09-10 ENCOUNTER — Ambulatory Visit (INDEPENDENT_AMBULATORY_CARE_PROVIDER_SITE_OTHER): Payer: 59 | Admitting: Family Medicine

## 2016-09-10 ENCOUNTER — Encounter: Payer: Self-pay | Admitting: Family Medicine

## 2016-09-10 VITALS — BP 120/90 | HR 99 | Temp 99.0°F | Ht 75.0 in | Wt 249.4 lb

## 2016-09-10 DIAGNOSIS — I2609 Other pulmonary embolism with acute cor pulmonale: Secondary | ICD-10-CM

## 2016-09-10 DIAGNOSIS — M1712 Unilateral primary osteoarthritis, left knee: Secondary | ICD-10-CM

## 2016-09-10 MED ORDER — OXYCODONE-ACETAMINOPHEN 5-325 MG PO TABS: 1.0000 | ORAL_TABLET | Freq: Three times a day (TID) | ORAL | 0 refills | Status: DC | PRN

## 2016-09-10 MED FILL — OXYCODONE-ACETAMINOPHEN 5-3: 5-325 | 6 days supply | Qty: 40 | Fill #0

## 2016-09-10 NOTE — Patient Instructions (Addendum)
You need to call Dr. Ashok Cordia to schedule an appointment.  I have prescribed Roxicet. Try to make this last for at least 2 weeks. This is NOT a long term solution.  Please follow up with Dr. Erlinda Hong to see if there are other things (like gel shots) which may give you some relief until you can get surgery.

## 2016-09-10 NOTE — Assessment & Plan Note (Signed)
Asymptomatic currently. Still on Eliquis. Pt advised to f/u with Dr. Ashok Cordia

## 2016-09-10 NOTE — Progress Notes (Signed)
Subjective: CC: knee pain HPI: Patient is a 51 y.o. male with a past medical history of degenerative changes of the knees, submassive PE with right heart strain, DVTs presenting to clinic today for knee pain.   He's had injections in his knees bilaterally over the last month and a half. Pain is 8/10. He's doubling tramadol and not having improvement. He hasn't seen Dr. Erlinda Hong since he recommended total knee replacement on the left. He cannot be cleared for surgery at this time due to his submassive bilateral PEs. He cannot stand for long periods of time. He endorses popping and locking sensations. He asked me previously to write a letter for his lawyer stating he cannot work due to issues with child support. 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 We discussed whether the patient could potentially work on this job. He states he could and would be willing to get transportation however that is not very big issue. I note that unfortunately, transportation does not fall under my purview and I can only state what he is limited to physically do his health in my note.   SR the PE is concerned He denies SOB, chest pain, palpitations. He has not made a follow-up with Dr. Ashok Cordia. He has seen Dr. Radford Pax and they are planning additional workup at the end of June. He is taking Eliquis.   Social History: current smoker but down to 3 cigarettes per day.   ROS: All other systems reviewed and are negative.  Past Medical History Patient Active Problem List   Diagnosis Date Noted  . Acute pulmonary embolus ([place])   . Primary osteoarthritis of left knee 07/28/2016  . Chest pain   . Abnormal EKG   . Pulmonary embolism ([place]) 06/03/2016  . Degenerative arthritis of right knee 08/08/2015  . Encounter for chronic pain management 04/06/2014  .  Tobacco abuse 12/05/2013  . DVT (deep venous thrombosis) ([place]) 11/28/2013  . Candidate for statin therapy due to risk of future cardiovascular event 05/05/2013  . HTN (hypertension) 05/04/2013  . Bilateral knee pain 05/04/2013  . Lumbar back pain 05/04/2013  . Healthcare maintenance 05/04/2013  . BMI 33.0-33.9,adult 05/04/2013  . Hx of substance abuse 05/04/2013    Medications- reviewed and updated Current Outpatient Prescriptions  Medication Sig Dispense Refill  . amLODipine (NORVASC) 10 MG tablet Take 1 tablet (10 mg total) by mouth daily. 90 tablet 3  . apixaban (ELIQUIS) 5 MG TABS tablet Take 2 tablets (10 mg total) by mouth 2 (two) times daily. From 3/8/-3/14 and then 1 tab (5mg  total) by mouth 2 times daily 60 tablet 3  . atorvastatin (LIPITOR) 40 MG tablet TAKE 1 TABLET BY MOUTH ONCE DAILY 30 tablet 0  . nicotine (NICODERM CQ - DOSED IN MG/24 HOURS) 21 mg/24hr patch Place 1 patch (21 mg total) onto the skin daily. 14 patch 0  . oxyCODONE-acetaminophen (ROXICET) 5-325 MG tablet Take 1-2 tablets by mouth every 8 (eight) hours as needed for severe pain. 40 tablet 0  . traMADol (ULTRAM) 50 MG tablet Take 1 tablet (50 mg total) by mouth every 6 (six) hours as needed for moderate pain. 30 tablet 0   No current facility-administered medications for this visit.     Objective: Office vital signs reviewed. BP 120/90 (BP Location: Right Arm, Patient Position: Sitting,  Cuff Size: Large)   Pulse 99   Temp 99 F (37.2 C) (Oral)   Ht 6\' 3"  (1.905 m)   Wt 249 lb 6.4 oz (113.1 kg)   SpO2 96%   BMI 31.17 kg/m    Physical Examination:  General: Awake, alert, well- nourished, NAD Pulm: No increased WOB.  CTAB, without wheezes, rhonchi or crackles.  Cardio: RRR, no m/r/g noted.  Knees  knee: Normal to inspection without erythema, ecchymoses, effusion or obvious bony abnormalities.  No obvious Baker's cysts. Tenderness in lateral joint line on R, lateral and medial joint line tenderness on  the L.  No TTP along infrapatellar or pes anserine bursas.   ROM decreased in flexion (120 degrees) with terminal tenderness and pain. Normal extension (0 degrees).  Significant crepitus.  Ligaments with solid consistent endpoints including ACL, PCL, LCL, MCL.     Assessment/Plan: Pulmonary embolism ([place]) Asymptomatic currently. Still on Eliquis. Pt advised to f/u with Dr. Ashok Cordia   Primary osteoarthritis of left knee The patient's arthritis in his knees are significant. He ultimately requires a TKA per orthopedics. Does not seem as though his gun much benefit from the steroid injections in the past. -We will transition to Roxicet 5/325 mg every 8 hours. Advised the patient that this prescription should hopefully last him for 2 weeks. -The patient understands that this is not a long-term solution and he cannot be on opioids long-term -Advised the patient to follow-up with Dr. Erlinda Hong to see vision the alternatives such as hyaluronic injections. 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 I do not feel comfortable saying he cannot work any jobs.    No orders of the defined types were placed in this encounter.   Meds ordered this encounter  Medications  . oxyCODONE-acetaminophen (ROXICET) 5-325 MG tablet    Sig: Take 1-2 tablets by mouth every 8 (eight) hours as needed for severe pain.    Dispense:  40 tablet    Refill:  [place] PGY-3, [place]

## 2016-09-10 NOTE — Assessment & Plan Note (Signed)
The patient's arthritis in his knees are significant. He ultimately requires a TKA per orthopedics. Does not seem as though his gun much benefit from the steroid injections in the past. -We will transition to Roxicet 5/325 mg every 8 hours. Advised the patient that this prescription should hopefully last him for 2 weeks. -The patient understands that this is not a long-term solution and he cannot be on opioids long-term -Advised the patient to follow-up with Dr. Erlinda Hong to see vision the alternatives such as hyaluronic injections. 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 I do not feel comfortable saying he cannot work any jobs.

## 2016-09-17 ENCOUNTER — Ambulatory Visit (INDEPENDENT_AMBULATORY_CARE_PROVIDER_SITE_OTHER): Payer: 59 | Admitting: Orthopaedic Surgery

## 2016-09-17 DIAGNOSIS — M1712 Unilateral primary osteoarthritis, left knee: Secondary | ICD-10-CM

## 2016-09-17 DIAGNOSIS — M1711 Unilateral primary osteoarthritis, right knee: Secondary | ICD-10-CM

## 2016-09-17 NOTE — Progress Notes (Signed)
Office Visit Note   Patient: Jeffrey [name]           Date of Birth: [birth year]           MRN: [id number] Visit Date: 09/17/2016              Requested by: Archie Patten, MD [address],  [address] PCP: Archie Patten, MD   Assessment & Plan: Visit Diagnoses:  1. Primary osteoarthritis of right knee   2. Primary osteoarthritis of left knee     Plan: Patient has severe degenerative joint disease bilaterally with multiple loose bodies. I doubt he'll get much relief from the hyaluronic acid injections but is worth a try. We've submitted preapproval for Monovisc today. We'll see him back for the injections.  Follow-Up Instructions: Return if symptoms worsen or fail to improve.   Orders:  No orders of the defined types were placed in this encounter.  No orders of the defined types were placed in this encounter.     Procedures: No procedures performed   Clinical Data: No additional findings.   Subjective: No chief complaint on file.   Patient follows up today for bilateral knee arthritis. He is inquiring about viscous supplementation. He is currently awaiting clearance from his PCP for knee replacement surgery.    Review of Systems   Objective: Vital Signs: There were no vitals taken for this visit.  Physical Exam  Ortho Exam Bilateral knee exam is stable. Specialty Comments:  No specialty comments available.  Imaging: No results found.   PMFS History: Patient Active Problem List   Diagnosis Date Noted  . Acute pulmonary embolus ([place])   . Primary osteoarthritis of left knee 07/28/2016  . Chest pain   . Abnormal EKG   . Pulmonary embolism ([place]) 06/03/2016  . Degenerative arthritis of right knee 08/08/2015  . Encounter for chronic pain management 04/06/2014  . Tobacco abuse 12/05/2013  . DVT (deep venous thrombosis) ([place]) 11/28/2013  . Candidate for statin therapy due to risk of future cardiovascular event 05/05/2013  .  HTN (hypertension) 05/04/2013  . Bilateral knee pain 05/04/2013  . Lumbar back pain 05/04/2013  . Healthcare maintenance 05/04/2013  . BMI 33.0-33.9,adult 05/04/2013  . Hx of substance abuse 05/04/2013   Past Medical History:  Diagnosis Date  . Acute pulmonary embolus ([facility])    bilateral submassive PE in setting of missing several doses of Xarelto for his DVT  . DVT (deep venous thrombosis) ([place]) 11/28/2013   RT LEG  . Hypertension   . Marijuana use     Family History  Problem Relation Age of Onset  . Heart disease Other   . Arthritis Other   . Heart disease Other   . Arthritis Other   . Alcohol abuse Mother   . Heart disease Mother   . Depression Mother   . Hypertension Mother   . Kidney disease Mother   . Arthritis Mother   . Arthritis Father   . Alcohol abuse Brother   . Drug abuse Brother   . Sickle cell anemia Brother     Past Surgical History:  Procedure Laterality Date  . HERNIA REPAIR     2005 and 2011  . KNEE SURGERY Bilateral    Left knee 1993, right knee 2004   Social History   Occupational History  . Not on file.   Social History Main Topics  . Smoking status: Current Every Day Smoker    Packs/day: 0.50  Years: 37.00    Types: Cigarettes  . Smokeless tobacco: Never Used     Comment: Currently smoking 3 cigarettes a day.   . Alcohol use Yes     Comment: 1-2 drinks per event, few times per week  . Drug use: Yes    Types: Marijuana, Cocaine     Comment: Cocaine last used in 2004, current marijuana use  . Sexual activity: Yes    Birth control/ protection: None     Comment: With wife

## 2016-09-18 ENCOUNTER — Other Ambulatory Visit: Payer: Self-pay | Admitting: Family Medicine

## 2016-09-18 MED FILL — ELIQUIS 5 MG TABLET: 5 | 30 days supply | Qty: 60 | Fill #3

## 2016-09-21 ENCOUNTER — Telehealth (INDEPENDENT_AMBULATORY_CARE_PROVIDER_SITE_OTHER): Payer: Self-pay

## 2016-09-21 MED FILL — ATORVASTATIN 40 MG TABLET: 40 | 30 days supply | Qty: 30 | Fill #0

## 2016-09-21 NOTE — Telephone Encounter (Signed)
IC LM for patient advising received notification from [facility] that they would cover injection. Advised to [facility] if he wanted to schedule.

## 2016-09-22 ENCOUNTER — Other Ambulatory Visit: Payer: Self-pay

## 2016-09-22 ENCOUNTER — Ambulatory Visit (HOSPITAL_COMMUNITY): Payer: 59 | Attending: Cardiology

## 2016-09-22 DIAGNOSIS — I10 Essential (primary) hypertension: Secondary | ICD-10-CM | POA: Insufficient documentation

## 2016-09-22 DIAGNOSIS — I2699 Other pulmonary embolism without acute cor pulmonale: Secondary | ICD-10-CM | POA: Insufficient documentation

## 2016-09-22 DIAGNOSIS — R079 Chest pain, unspecified: Secondary | ICD-10-CM | POA: Insufficient documentation

## 2016-09-22 DIAGNOSIS — I371 Nonrheumatic pulmonary valve insufficiency: Secondary | ICD-10-CM | POA: Insufficient documentation

## 2016-09-22 DIAGNOSIS — F1210 Cannabis abuse, uncomplicated: Secondary | ICD-10-CM | POA: Diagnosis not present

## 2016-09-22 DIAGNOSIS — I071 Rheumatic tricuspid insufficiency: Secondary | ICD-10-CM | POA: Insufficient documentation

## 2016-09-22 DIAGNOSIS — R9431 Abnormal electrocardiogram [ECG] [EKG]: Secondary | ICD-10-CM | POA: Insufficient documentation

## 2016-09-23 ENCOUNTER — Telehealth: Payer: Self-pay | Admitting: Cardiology

## 2016-09-23 NOTE — Telephone Encounter (Signed)
-----   Message from Sueanne Margarita, MD sent at 09/23/2016  3:25 AM EDT ----- Echo showed normal LVF with moderate LVH, mild LAE

## 2016-09-23 NOTE — Telephone Encounter (Signed)
Patient made aware of results. Patient verbalizes understanding. Patient requesting letter stating that he cannot have his knee surgery until 6 months after he had his PEs. Note left up front for patient to pick up.

## 2016-09-23 NOTE — Telephone Encounter (Signed)
°  Follow Up   Pt calling to follow up on echocardiogram results. Please call.

## 2016-10-05 ENCOUNTER — Telehealth: Payer: Self-pay | Admitting: Internal Medicine

## 2016-10-05 ENCOUNTER — Encounter: Payer: Self-pay | Admitting: Cardiology

## 2016-10-05 ENCOUNTER — Telehealth: Payer: Self-pay | Admitting: Cardiology

## 2016-10-05 NOTE — Telephone Encounter (Signed)
Needs refill on oxycodone.  [facility] outpatient.  He has enough for today. He has an appt scheduled for July 16.  He would like enough to make it to the appt.  Please let pt know if the Rx can be called in or he needs to pick it up

## 2016-10-05 NOTE — Telephone Encounter (Signed)
This is an addendum to previous phone note

## 2016-10-05 NOTE — Telephone Encounter (Signed)
Mr.Trimpe is calling about a letter in which he is supposed to pick up. He wanting to know if the letter is available for him to pick up . Please call

## 2016-10-05 NOTE — Telephone Encounter (Signed)
Pts appt is July 16.  This is the first one available. Could dr prescribe enough to get him to the appts? Please advise

## 2016-10-05 NOTE — Telephone Encounter (Signed)
Informed patient letter is at front desk available for him to pick up. Patient states he will stop by tomorrow.

## 2016-10-05 NOTE — Telephone Encounter (Signed)
In general I don't prescribe pain control substances before I have seen the patient. There maybe some appointment's available sooner if patient would like to get in to see me before July 17th

## 2016-10-06 NOTE — Telephone Encounter (Signed)
As discussed previously, patient will need to be seen before providing pain medication. I can refill tramadol however not the oxycodone.

## 2016-10-06 NOTE — Telephone Encounter (Signed)
Cardiac CT has been scheduled 7/25.

## 2016-10-06 NOTE — Telephone Encounter (Signed)
Pt calling to see if Dr. Aletha Halim give him enough pain medication to get him to his appt on July 16th. Please call patient when a decision is made. Ottis Stain, CMA

## 2016-10-08 ENCOUNTER — Encounter: Payer: Self-pay | Admitting: Internal Medicine

## 2016-10-08 ENCOUNTER — Ambulatory Visit (INDEPENDENT_AMBULATORY_CARE_PROVIDER_SITE_OTHER): Payer: 59 | Admitting: Internal Medicine

## 2016-10-08 VITALS — BP 130/90 | HR 84 | Wt 259.0 lb

## 2016-10-08 DIAGNOSIS — M25561 Pain in right knee: Secondary | ICD-10-CM

## 2016-10-08 DIAGNOSIS — M25562 Pain in left knee: Secondary | ICD-10-CM

## 2016-10-08 DIAGNOSIS — M1711 Unilateral primary osteoarthritis, right knee: Secondary | ICD-10-CM

## 2016-10-08 DIAGNOSIS — G8929 Other chronic pain: Secondary | ICD-10-CM

## 2016-10-08 DIAGNOSIS — M1712 Unilateral primary osteoarthritis, left knee: Secondary | ICD-10-CM | POA: Diagnosis not present

## 2016-10-08 MED ORDER — TRAMADOL HCL 50 MG PO TABS: 50.0000 mg | ORAL_TABLET | Freq: Three times a day (TID) | ORAL | 2 refills | Status: DC

## 2016-10-08 MED ORDER — HYDROCODONE-ACETAMINOPHEN 5-325 MG PO TABS: 1.0000 | ORAL_TABLET | Freq: Four times a day (QID) | ORAL | 0 refills | Status: DC | PRN

## 2016-10-08 MED FILL — HYDROCODON-APAP 5-325: 5-325 | 12 days supply | Qty: 50 | Fill #0

## 2016-10-08 MED FILL — traMADol HCL 50 MG TABS: 50 | 30 days supply | Qty: 90 | Fill #0

## 2016-10-08 NOTE — Telephone Encounter (Signed)
Patient has an appt today with PCP. Lounell Schumacher,CMA

## 2016-10-08 NOTE — Patient Instructions (Signed)
Please take tramadol every 8 hours for pain. When having severe pain then you can take 1 Norco as needed, I would not recommended taking daily

## 2016-10-08 NOTE — Progress Notes (Signed)
   [name] Kerrin Mo, MD Phone: [phone]  Reason For Visit: F/U for Pain Management   # Pain Management for bilateral osteoarthritis  Patient is following with Dr. Erlinda Hong for his bilateral osteoarthritis which is severe. The patient with recent PE and therefore has surgery on left knee, total knee replacement postponed until September. In the meantime patient has been receiving tramadol and then Percocet from Dr. Lorenso Courier. Per Dr. Erlinda Hong plans for injection in the right knee with hyaluronic acid followed by physical therapy. Currently waiting on insurance for approval. Pain management per patient has been - ice, heat and elevation and Percocet only - Patient can work about one hour physically before having severe pain - Pain Medication oxycodone - has not been using tylenol - patient states that two a day helped a great deal  - Tramadol was not working previously so this was completely stop  Past Medical History Reviewed problem list.  Medications- reviewed and updated No additions to family history Social history- patient is a smoker  Objective: BP 130/90   Pulse 84   Wt 259 lb (117.5 kg)   BMI 32.37 kg/m  Gen: NAD, alert, cooperative with exam Cardio: regular rate and rhythm, S1S2 heard, no murmurs appreciated Pulm: clear to auscultation bilaterally, no wheezes, rhonchi or rales Knee: Bilateral knee braces in place, tenderness with range of motion, palpation  Assessment/Plan: See problem based a/p  Bilateral knee pain Bilateral osteoarthritis/Pain management  - Continue following along with Dr. Erlinda Hong  - Tramadol TID for pain, with Norco for breakthrough pain - Discuss with patient that there limited length of time I would be willing to provide narcotics - Follow up in 1 month for blood pressure check

## 2016-10-09 NOTE — Assessment & Plan Note (Signed)
Bilateral osteoarthritis/Pain management  - Continue following along with Dr. Erlinda Hong  - Tramadol TID for pain, with Norco for breakthrough pain - Discuss with patient that there limited length of time I would be willing to provide narcotics - Follow up in 1 month for blood pressure check

## 2016-10-12 ENCOUNTER — Ambulatory Visit: Payer: 59 | Admitting: Internal Medicine

## 2016-10-13 ENCOUNTER — Ambulatory Visit (HOSPITAL_COMMUNITY): Admission: RE | Admit: 2016-10-13 | Payer: 59 | Source: Ambulatory Visit

## 2016-10-13 ENCOUNTER — Ambulatory Visit (HOSPITAL_COMMUNITY)
Admission: RE | Admit: 2016-10-13 | Discharge: 2016-10-13 | Disposition: A | Discharge status: Home / Self Care | Payer: 59 | Source: Ambulatory Visit | Attending: Cardiology | Admitting: Cardiology

## 2016-10-13 DIAGNOSIS — R079 Chest pain, unspecified: Secondary | ICD-10-CM

## 2016-10-13 DIAGNOSIS — I288 Other diseases of pulmonary vessels: Secondary | ICD-10-CM | POA: Diagnosis not present

## 2016-10-13 DIAGNOSIS — R9431 Abnormal electrocardiogram [ECG] [EKG]: Secondary | ICD-10-CM | POA: Diagnosis present

## 2016-10-13 DIAGNOSIS — I2510 Atherosclerotic heart disease of native coronary artery without angina pectoris: Secondary | ICD-10-CM | POA: Insufficient documentation

## 2016-10-13 MED ORDER — NITROGLYCERIN 0.4 MG SL SUBL
SUBLINGUAL_TABLET | SUBLINGUAL | Status: AC
  Administered 2016-10-13: 0.8 mg
  Filled 2016-10-13: qty 1

## 2016-10-13 MED ORDER — METOPROLOL TARTRATE 5 MG/5ML IV SOLN
INTRAVENOUS | Status: AC
  Administered 2016-10-13: 5 mg via INTRAVENOUS
  Filled 2016-10-13: qty 10

## 2016-10-13 MED ORDER — METOPROLOL TARTRATE 5 MG/5ML IV SOLN
INTRAVENOUS | Status: AC
  Administered 2016-10-13: 5 mg
  Filled 2016-10-13: qty 10

## 2016-10-13 MED ORDER — IOPAMIDOL (ISOVUE-370) INJECTION 76%
INTRAVENOUS | Status: AC
  Administered 2016-10-13: 80 mL via INTRAVENOUS
  Filled 2016-10-13: qty 100

## 2016-10-13 MED ORDER — METOPROLOL TARTRATE 5 MG/5ML IV SOLN
5.0000 mg | INTRAVENOUS | Status: DC | PRN
  Administered 2016-10-13 (×2): 5 mg via INTRAVENOUS

## 2016-10-14 ENCOUNTER — Telehealth: Payer: Self-pay

## 2016-10-14 DIAGNOSIS — K760 Fatty (change of) liver, not elsewhere classified: Secondary | ICD-10-CM

## 2016-10-14 DIAGNOSIS — I2510 Atherosclerotic heart disease of native coronary artery without angina pectoris: Secondary | ICD-10-CM

## 2016-10-14 NOTE — Telephone Encounter (Signed)
Informed patient of results and verbal understanding expressed.  Encouraged aggressive risk factor modification. FLP and ALT scheduled tomorrow. GI referral placed. Patient agrees with treatment plan.

## 2016-10-14 NOTE — Telephone Encounter (Signed)
-----   Message from Sueanne Margarita, MD sent at 10/14/2016  2:31 PM EDT ----- Coronary CTA showed high calcium score with mild nonobstructive CAD.  He needs aggressive risk factor modification.  No ASA due to NOAC.  He is on statin.  Please get an FLP and ALT.  Noncardiac portions of CT showed decreasing size of bilateral pulmonary emboli when complared to study of 06/03/2016 and severe hepatic steatosis.  Please refer to GI for hepatic steatosis.

## 2016-10-15 ENCOUNTER — Other Ambulatory Visit: Payer: 59 | Admitting: *Deleted

## 2016-10-15 DIAGNOSIS — I2510 Atherosclerotic heart disease of native coronary artery without angina pectoris: Secondary | ICD-10-CM

## 2016-10-15 DIAGNOSIS — K760 Fatty (change of) liver, not elsewhere classified: Secondary | ICD-10-CM

## 2016-10-15 LAB — HEPATIC FUNCTION PANEL
ALK PHOS: 83 IU/L (ref 39–117)
ALT: 27 IU/L (ref 0–44)
AST: 36 IU/L (ref 0–40)
Albumin: 4.5 g/dL (ref 3.5–5.5)
BILIRUBIN TOTAL: 0.5 mg/dL (ref 0.0–1.2)
BILIRUBIN, DIRECT: 0.2 mg/dL (ref 0.00–0.40)
TOTAL PROTEIN: 7.4 g/dL (ref 6.0–8.5)

## 2016-10-15 LAB — LIPID PANEL
CHOL/HDL RATIO: 2.1 ratio (ref 0.0–5.0)
Cholesterol, Total: 103 mg/dL (ref 100–199)
HDL: 50 mg/dL (ref >39)
LDL Calculated: 40 mg/dL (ref 0–99)
TRIGLYCERIDES: 67 mg/dL (ref 0–149)
VLDL Cholesterol Cal: 13 mg/dL (ref 5–40)

## 2016-10-16 ENCOUNTER — Ambulatory Visit (HOSPITAL_COMMUNITY): Payer: 59

## 2016-10-20 ENCOUNTER — Telehealth: Payer: Self-pay | Admitting: Internal Medicine

## 2016-10-20 NOTE — Telephone Encounter (Signed)
Return to work authorization form dropped off for at front desk for completion.  Verified that patient section of form has been completed.  Last DOS with PCP was 10/08/16.  Placed form in team folder to be completed by clinical staff.  Crista Luria

## 2016-10-21 ENCOUNTER — Ambulatory Visit (HOSPITAL_COMMUNITY): Payer: 59

## 2016-10-21 NOTE — Telephone Encounter (Signed)
Form placed in PCP box 

## 2016-10-26 ENCOUNTER — Telehealth: Payer: Self-pay | Admitting: Internal Medicine

## 2016-10-26 NOTE — Telephone Encounter (Signed)
Called patient to discuss paperwork. Provided a preliminary idea of when patient will be able to start working again.

## 2016-10-26 NOTE — Telephone Encounter (Signed)
Called patient and filled out form

## 2016-10-27 ENCOUNTER — Telehealth: Payer: Self-pay | Admitting: *Deleted

## 2016-10-27 NOTE — Telephone Encounter (Signed)
Patient advised that form is complete.  Medical records will be mailed.  Derl Barrow, RN

## 2016-11-02 ENCOUNTER — Ambulatory Visit (INDEPENDENT_AMBULATORY_CARE_PROVIDER_SITE_OTHER): Payer: 59 | Admitting: Orthopaedic Surgery

## 2016-11-02 DIAGNOSIS — M1712 Unilateral primary osteoarthritis, left knee: Secondary | ICD-10-CM

## 2016-11-02 DIAGNOSIS — M1711 Unilateral primary osteoarthritis, right knee: Secondary | ICD-10-CM | POA: Diagnosis not present

## 2016-11-02 MED ORDER — HYALURONAN 88 MG/4ML IX SOSY
88.0000 mg | PREFILLED_SYRINGE | INTRA_ARTICULAR | Status: AC | PRN
  Administered 2016-11-02: 88 mg via INTRA_ARTICULAR

## 2016-11-02 NOTE — Progress Notes (Signed)
   Procedure Note  Patient: Jeffrey [name]             Date of Birth: April 16, 1965           MRN: [id number]             Visit Date: 11/02/2016  Procedures: Visit Diagnoses: Primary osteoarthritis of right knee  Primary osteoarthritis of left knee  Large Joint Inj Date/Time: 11/02/2016 10:36 AM Performed by: Leandrew Koyanagi Authorized by: Leandrew Koyanagi   Consent Given by:  Patient Timeout: prior to procedure the correct patient, procedure, and site was verified   Indications:  Pain Location:  Knee Site:  R knee Prep: patient was prepped and draped in usual sterile fashion   Needle Size:  22 G Approach:  Anterolateral Ultrasound Guidance: No   Fluoroscopic Guidance: No   Arthrogram: No   Medications:  88 mg Hyaluronan 88 MG/4ML Large Joint Inj Date/Time: 11/02/2016 10:36 AM Performed by: Leandrew Koyanagi Authorized by: Leandrew Koyanagi   Consent Given by:  Patient Timeout: prior to procedure the correct patient, procedure, and site was verified   Indications:  Pain Location:  Knee Site:  L knee Prep: patient was prepped and draped in usual sterile fashion   Needle Size:  22 G Approach:  Anterolateral Ultrasound Guidance: No   Fluoroscopic Guidance: No   Arthrogram: No   Medications:  88 mg Hyaluronan 88 MG/4ML

## 2016-11-10 ENCOUNTER — Encounter: Payer: Self-pay | Admitting: Internal Medicine

## 2016-11-10 ENCOUNTER — Ambulatory Visit (INDEPENDENT_AMBULATORY_CARE_PROVIDER_SITE_OTHER): Payer: 59 | Admitting: Internal Medicine

## 2016-11-10 VITALS — BP 138/98 | HR 100 | Temp 98.3°F | Ht 75.0 in | Wt 256.2 lb

## 2016-11-10 DIAGNOSIS — I2699 Other pulmonary embolism without acute cor pulmonale: Secondary | ICD-10-CM | POA: Diagnosis not present

## 2016-11-10 DIAGNOSIS — I10 Essential (primary) hypertension: Secondary | ICD-10-CM

## 2016-11-10 DIAGNOSIS — M25562 Pain in left knee: Secondary | ICD-10-CM

## 2016-11-10 DIAGNOSIS — M25561 Pain in right knee: Secondary | ICD-10-CM

## 2016-11-10 MED ORDER — HYDROCODONE-ACETAMINOPHEN 5-325 MG PO TABS: 1.0000 | ORAL_TABLET | Freq: Four times a day (QID) | ORAL | 0 refills | Status: DC | PRN

## 2016-11-10 MED FILL — HYDROCODON-APAP 5-325: 5-325 | 12 days supply | Qty: 50 | Fill #0

## 2016-11-10 NOTE — Progress Notes (Signed)
Date of Visit: 11/10/2016   HPI:  Bilateral Osteoarthritis:  The patient reports that he received an injection in his knees this past week and that this procedure has improved his pain and functional capacity. In addition to this injection, the patient manages his pain with Hydrocodone and Tramadol, which he takes as needed, especially in the morning when his symptoms are worse. Between these two management strategies, he is now able to do yard work for aprox. 30 min, which represents an improvement. He does not have trouble walking around his house, or with ADLs but does not feel like he would be able to work for an extended period of time. He denies changes in the nature of his knee pain, but does endorse some mild lower back pain, which he attributes to his mobility related to his bilateral osteoarthritis. The patient denies constipation or changes in the consistency of his stools.   Pulmonary Embolism: This has been previously diagnosed and was secondary to DVTs. The patient has been taking Eliquis and reports compliance with this medication. He follows with a pulmonologist to manage this condition and has an appointment scheduled for August 28th. He states that his SOB has been improving and he does not get short of breath walking into clinic or with ADLs. He denies hemoptysis, bloody or dark stools, recent falls or any other bleeding episodes that he has been aware of.   Hypertension:  The patient reports that he has been taking his Amlodipine daily as prescribed. He has been able to check his blood pressure at home and typically seems numbers between 120 and 130.  He endorses some occasional swelling of his legs when he has been sitting for a while, but states that this is a good day and does not feel that his legs are swollen.    ROS: See HPI.  Cash: Patient states that he is down to 3 cigarettes per day and that he has recently been prescribed nicotine patches. He plans to start using these  patches as surgery approaches and realizes that he will be told to quit smoking before surgery. He is motivated to use this as an opportunity to improve his health.   PHYSICAL EXAM: BP (!) 138/98   Pulse 100   Temp 98.3 F (36.8 C) (Oral)   Ht 6\' 3"  (1.905 m)   Wt 256 lb 3.2 oz (116.2 kg)   SpO2 96%   BMI 32.02 kg/m  Gen: Well appearing, NAD  HEENT: no anterior or posterior cervical lymphadenopathy Heart: RRR, Normal S1, S2, no Murmurs, Rubs or Gallops  Lungs: No increased work of breathing, CAB Ext: No peripheral edema in bilateral lower extremities, radial and posterior tibial pulses intact bilaterally, capillary refill brisk in hands  Msk: Normal strength in bilateral lower extremities, some tenderness to palpation of bilateral knees, some pain on passive motion of knee joint, crepitus with motion of bilateral knees L>R   ASSESSMENT/PLAN:   No problem-specific Assessment & Plan notes found for this encounter.

## 2016-11-10 NOTE — Assessment & Plan Note (Signed)
Provide two prescription for Norco - august and September  Tramadol already refilled previously  Follow up in 1 - 31months

## 2016-11-10 NOTE — Patient Instructions (Addendum)
It was nice seeing you today. Please follow-up in 1-2 months. I want to recheck your blood pressure at that time.

## 2016-11-10 NOTE — Assessment & Plan Note (Signed)
Blood pressure slightly uncontrolled, would like BP of 130/80  Will continue to check on patient's blood pressure  Check BMET

## 2016-11-10 NOTE — Assessment & Plan Note (Addendum)
Continue Eliquis, follow up with pulmonology  Will check CBC, no signs of bleeding

## 2016-11-11 ENCOUNTER — Encounter (HOSPITAL_COMMUNITY): Payer: Self-pay | Admitting: Internal Medicine

## 2016-11-11 LAB — CBC
HEMATOCRIT: 42.8 % (ref 37.5–51.0)
HEMOGLOBIN: 14.3 g/dL (ref 13.0–17.7)
MCH: 30.3 pg (ref 26.6–33.0)
MCHC: 33.4 g/dL (ref 31.5–35.7)
MCV: 91 fL (ref 79–97)
PLATELETS: 194 10*3/uL (ref 150–379)
RBC: 4.72 x10E6/uL (ref 4.14–5.80)
RDW: 15 % (ref 12.3–15.4)
WBC: 7.1 10*3/uL (ref 3.4–10.8)

## 2016-11-11 LAB — BASIC METABOLIC PANEL
BUN/Creatinine Ratio: 10 (ref 9–20)
BUN: 9 mg/dL (ref 6–24)
CALCIUM: 9 mg/dL (ref 8.7–10.2)
CO2: 22 mmol/L (ref 20–29)
CREATININE: 0.86 mg/dL (ref 0.76–1.27)
Chloride: 102 mmol/L (ref 96–106)
GFR calc Af Amer: 114 mL/min/{1.73_m2} (ref >59)
GFR, EST NON AFRICAN AMERICAN: 99 mL/min/{1.73_m2} (ref >59)
GLUCOSE: 85 mg/dL (ref 65–99)
Potassium: 4.4 mmol/L (ref 3.5–5.2)
SODIUM: 137 mmol/L (ref 134–144)

## 2016-11-13 ENCOUNTER — Encounter: Payer: Self-pay | Admitting: Physician Assistant

## 2016-11-16 ENCOUNTER — Telehealth: Payer: Self-pay | Admitting: *Deleted

## 2016-11-16 ENCOUNTER — Other Ambulatory Visit: Payer: Self-pay | Admitting: Family Medicine

## 2016-11-16 DIAGNOSIS — I10 Essential (primary) hypertension: Secondary | ICD-10-CM

## 2016-11-16 MED ORDER — ATORVASTATIN CALCIUM 40 MG PO TABS: 40.0000 mg | ORAL_TABLET | Freq: Every day | ORAL | 0 refills | Status: DC

## 2016-11-16 MED ORDER — AMLODIPINE BESYLATE 10 MG PO TABS: 10.0000 mg | ORAL_TABLET | Freq: Every day | ORAL | 3 refills | Status: DC

## 2016-11-16 MED ORDER — APIXABAN 5 MG PO TABS: 5.0000 mg | ORAL_TABLET | Freq: Two times a day (BID) | ORAL | 3 refills | Status: DC

## 2016-11-16 MED FILL — ATORVASTATIN 40 MG TABLET: 40 | 30 days supply | Qty: 30 | Fill #0

## 2016-11-16 MED FILL — ELIQUIS 5 MG TABLET: 5 | 30 days supply | Qty: 60 | Fill #0

## 2016-11-16 MED FILL — AMLODIPINE BESYLATE 10 MG T: 10 | 90 days supply | Qty: 90 | Fill #0

## 2016-11-16 NOTE — Addendum Note (Signed)
Addended by: Kerrin Mo Z on: 11/16/2016 03:56 PM   Modules accepted: Orders

## 2016-11-16 NOTE — Telephone Encounter (Signed)
Pharmacist from [facility] called to get order to change the directions of Eliquis. The refill that was sent had the starting dose. Order given to change to maintenance  dosage. Please up date medication list.   Derl Barrow, RN

## 2016-11-16 NOTE — Telephone Encounter (Signed)
Changed the prescription per pharmacy request

## 2016-11-24 ENCOUNTER — Ambulatory Visit (INDEPENDENT_AMBULATORY_CARE_PROVIDER_SITE_OTHER): Payer: 59 | Admitting: Pulmonary Disease

## 2016-11-24 ENCOUNTER — Other Ambulatory Visit: Payer: 59

## 2016-11-24 ENCOUNTER — Encounter: Payer: Self-pay | Admitting: Pulmonary Disease

## 2016-11-24 VITALS — BP 148/100 | HR 88 | Ht 75.0 in | Wt 254.2 lb

## 2016-11-24 DIAGNOSIS — F17210 Nicotine dependence, cigarettes, uncomplicated: Secondary | ICD-10-CM

## 2016-11-24 DIAGNOSIS — I2609 Other pulmonary embolism with acute cor pulmonale: Secondary | ICD-10-CM

## 2016-11-24 DIAGNOSIS — I82401 Acute embolism and thrombosis of unspecified deep veins of right lower extremity: Secondary | ICD-10-CM | POA: Diagnosis not present

## 2016-11-24 DIAGNOSIS — J439 Emphysema, unspecified: Secondary | ICD-10-CM

## 2016-11-24 DIAGNOSIS — R918 Other nonspecific abnormal finding of lung field: Secondary | ICD-10-CM

## 2016-11-24 DIAGNOSIS — Z720 Tobacco use: Secondary | ICD-10-CM

## 2016-11-24 NOTE — Progress Notes (Signed)
Subjective:    [name], male    DOB: 06/19/1965, 51 y.o.   MRN: [id number]  C.C.:  Follow-up for Pulmonary Embolism with Acute Cor Pulmonale, Pulmonary Emphysema, Left Upper Lobe Opacity, & Tobacco Use Disorder.   HPI Pre-op Risk Assessment:  Patient planned for a left knee replacement as soon as he can be cleared.   Pulmonary Embolism with Acute Cor Pulmonale:  No history of trauma or immobility. Found on CT angiogram 06/03/2016. Started on systemic anticoagulation after hospital evaluation by pulmonary. Previously prescribed Xarelto for right lower extremity DVT but missed some doses prior to presentation. Initially required 4 L/m by nasal cannula. Discharged on Eliquis.   Pulmonary Emphysema: Known history of tobacco use. Apical predominant. He reports his baseline dyspnea. No coughing or wheezing. No history of breathing problems or asthma as a child.   Left Upper Lobe Opacity: Likely secondary to infarction. Seen on CT imaging of the chest.  Tobacco Use Disorder:  He is still smoking. Smoking about 5-6 cigarettes daily. Hasn't used anything other than nicotine patches to help him quit.   Review of Systems No abdominal pain, nausea, or emesis. No melena, hematochezia or hematuria. No chest pain or pressure. No fever or chills.   No Known Allergies  Current Outpatient Prescriptions on File Prior to Visit  Medication Sig Dispense Refill  . amLODipine (NORVASC) 10 MG tablet Take 1 tablet (10 mg total) by mouth daily. 90 tablet 3  . apixaban (ELIQUIS) 5 MG TABS tablet Take 1 tablet (5 mg total) by mouth 2 (two) times daily. 60 tablet 3  . atorvastatin (LIPITOR) 40 MG tablet Take 1 tablet (40 mg total) by mouth daily. 30 tablet 0  . [START ON 12/11/2016] HYDROcodone-acetaminophen (NORCO) 5-325 MG tablet Take 1 tablet by mouth every 6 (six) hours as needed for moderate pain. 50 tablet 0  . nicotine (NICODERM CQ - DOSED IN MG/24 HOURS) 21 mg/24hr patch Place 1 patch (21  mg total) onto the skin daily. 14 patch 0  . traMADol (ULTRAM) 50 MG tablet Take 1 tablet (50 mg total) by mouth every 8 (eight) hours. 90 tablet 2   No current facility-administered medications on file prior to visit.     Past Medical History:  Diagnosis Date  . Acute pulmonary embolus (HCC)    bilateral submassive PE in setting of missing several doses of Xarelto for his DVT  . DVT (deep venous thrombosis) ([place]) 11/28/2013   RT LEG  . Hypertension   . Marijuana use     Past Surgical History:  Procedure Laterality Date  . HERNIA REPAIR     2005 and 2011  . KNEE SURGERY Bilateral    Left knee 1993, right knee 2004    Family History  Problem Relation Age of Onset  . Heart disease Other   . Arthritis Other   . Heart disease Other   . Arthritis Other   . Alcohol abuse Mother   . Heart disease Mother   . Depression Mother   . Hypertension Mother   . Kidney disease Mother   . Arthritis Mother   . Clotting disorder Mother   . Arthritis Father   . Alcohol abuse Brother   . Drug abuse Brother   . Sickle cell anemia Brother   . Asthma Cousin     Social History   Social History  . Marital status: Married    Spouse name: N/A  . Number of children: N/A  .  Years of education: N/A   Social History Main Topics  . Smoking status: Current Every Day Smoker    Packs/day: 0.25    Years: 35.00    Types: Cigarettes    Start date: 10/07/1976  . Smokeless tobacco: Never Used     Comment: Stopped for up to 5 years total - Peak rate 2ppd  . Alcohol use Yes     Comment: 1-2 drinks per event, few times per week  . Drug use: Yes    Types: Marijuana, Cocaine     Comment: Cocaine last used in 2004, current marijuana use  . Sexual activity: Yes    Birth control/ protection: None     Comment: With wife   Other Topics Concern  . None   Social History Narrative   Lives in [place].   Chickens as pet.    Hobbies: Radiographer, therapeutic Pulmonary (11/24/16):   Originally  from [place]. Has worked in Architect, Ambulance person, Press photographer, & in a warehouse. No known asbestos exposure. Previously had chickens as pets. No mold exposure.       Objective:   Physical Exam BP (!) 148/100 (BP Location: Right Arm, Cuff Size: Normal)   Pulse 88   Ht _0  (1.905 m)   Wt 254 lb 3.2 oz (115.3 kg)   SpO2 97%   BMI 31.77 kg/m  General:  Awake. Alert. No acute distress. Mild central obesity. Integument:  Warm & dry. No rash on exposed skin. No bruising on exposed skin. Extremities:  No cyanosis or clubbing.  HEENT:  Moist mucus membranes. No oral ulcers. Moderate bilateral nasal turbinate swelling. Cardiovascular:  Regular rate. Trace right lower extremity edema. Normal S1 & S2.  Pulmonary:  Good aeration & clear to auscultation bilaterally. Symmetric chest wall expansion. No accessory muscle use on room air. Abdomen: Soft. Normal bowel sounds. Mildly protuberant. Musculoskeletal:  Normal bulk and tone. Hand grip strength 5/5 bilaterally. No joint deformity or effusion appreciated.  IMAGING CTA CHEST 06/03/16 (personally reviewed by me again today): Pulmonary embolism involving distal main pulmonary arteries extending into all lobes. Clot burden mild to large with RV/LV ratio 1.59. Segmental consolidation with air bronchograms and associated groundglass consistent with infarction within the left upper lobe. Apical predominant emphysematous changes with some subpleural bleb formation particularly in the right lung apex. No pleural effusion or thickening. No pericardial effusion. No pathologic mediastinal adenopathy.  CARDIAC TTE (09/22/16):  LV normal in size with moderate LVH. EF 60-65%. No regional wall motion abnormalities and normal diastolic function. LA & RA normal in size. RV normal in size and function. No aortic stenosis or regurgitation. Aortic root normal in size. No mitral stenosis or regurgitation. Trivial pulmonic regurgitation without stenosis. Mild tricuspid  regurgitation. No pericardial effusion.  TTE (06/04/16): LV normal in size with moderate LVH. EF 55-60% with normal regional wall motion & grade 1 diastolic dysfunction. LA normal in size & RA moderately dilated. RV moderately dilated with mild reduction in systolic function. No aortic stenosis or regurgitation. Aortic root normal in size. No mitral stenosis or regurgitation. Flattening of the ventricular septum during systole consistent with RV pressure overload. No pulmonic stenosis. No tricuspid regurgitation. Pulmonary artery normal in size. No pericardial effusion.  LABS 11/10/16 BMP: 137/4.4/102/22/9/0.86/85/9.0 CBC: 7.1/14.3/42.8/194    Assessment & Plan:  51 y.o. male with history of right lower extremity DVT and subsequent acute pulmonary embolism with acute cor pulmonale. Cor pulmonale has resolved on repeat echocardiogram. CT imaging  with lung opacity likely indicative of lung infarction. Given this DVT/PE was unprovoked I feel that evaluation by hematology is necessary. His mother did previously have clots but this occurred in her later years of life. The patient has no other symptoms that would suggest an underlying malignancy. With his underlying emphysema evaluation with pulmonary function testing is necessary. I instructed the patient to contact my office if he had any new breathing problems or questions before his next appointment. This testing will need to be done to complete his preoperative risk assessment before his left total knee replacement.  1. Acute pulmonary embolism with cor pulmonale:  Continuing systemic anticoagulation with Xarelto. Referring patient to hematology for further evaluation. Consider VQ scan pending CT imaging. 2. Pulmonary emphysema:  Checking full pulmonary function testing as well as 6 minute walk test on room air. Screening for alpha-1 antitrypsin deficiency.  3. Right lower extremity DVT: Checking d-dimer, ESR, and right lower extremity venous  duplex. 4. Left upper lobe opacity:  Repeat CT chest without contrast before next appointment.  5. Tobacco use disorder: Counseled for 3 minutes on the for complete tobacco cessation. 6. Health maintenance: Status post Pneumovax March 2018. 7. Follow-up: Return to clinic in 4 weeks or sooner if needed.  Sonia Baller Ashok Cordia, M.D. [facility] Pager:  [phone] After 3pm or if no response, call [phone] 11:07 AM 11/24/16

## 2016-11-24 NOTE — Patient Instructions (Addendum)
   Continue taking your Eliquis.  Call or e-mail me if you have any questions or concerns.  I'm referring you to Hematology to address your clots and ongoing blood thinner need.  TESTS ORDERED: 1. Full pulmonary function testing before next appointment 2. 6 minute walk test on room air at next appointment 3. CT chest without contrast before next appointment 4. Right lower extremity venous duplex 5. Serum alpha-1 antitrypsin phenotype, D-dimer, & ESR.

## 2016-11-25 LAB — D-DIMER, QUANTITATIVE: D-Dimer, Quant: 2.71 mcg/mL FEU — ABNORMAL HIGH (ref <0.50)

## 2016-11-26 ENCOUNTER — Other Ambulatory Visit: Payer: 59

## 2016-11-26 ENCOUNTER — Telehealth: Payer: Self-pay | Admitting: *Deleted

## 2016-11-26 ENCOUNTER — Encounter: Payer: Self-pay | Admitting: Physician Assistant

## 2016-11-26 ENCOUNTER — Ambulatory Visit (INDEPENDENT_AMBULATORY_CARE_PROVIDER_SITE_OTHER): Payer: 59 | Admitting: Physician Assistant

## 2016-11-26 VITALS — BP 120/88 | HR 83 | Ht 75.5 in | Wt 253.0 lb

## 2016-11-26 DIAGNOSIS — K760 Fatty (change of) liver, not elsewhere classified: Secondary | ICD-10-CM

## 2016-11-26 DIAGNOSIS — Z1211 Encounter for screening for malignant neoplasm of colon: Secondary | ICD-10-CM

## 2016-11-26 DIAGNOSIS — Z7901 Long term (current) use of anticoagulants: Secondary | ICD-10-CM | POA: Diagnosis not present

## 2016-11-26 MED ORDER — NA SULFATE-K SULFATE-MG SULF 17.5-3.13-1.6 GM/177ML PO SOLN: 1.0000 | Freq: Once | ORAL | 0 refills | Status: AC

## 2016-11-26 NOTE — Progress Notes (Signed)
Subjective:    [name], male    DOB: November 22, 1965, 51 y.o.   MRN: [id number]  HPI Jeffrey [name] is a pleasant 51 year old African-American male, referred today by [name] cardiology with concerns for hepatic steatosis. Patient states he is unclear why he was referred. He has not had any previous GI evaluation. Patient does have history of hyperlipidemia, hypertension and was found to have a DVT and large PE in March 2018 with right ventricular strain. He is currently on Eliquis. Patient has no complaints of abdominal discomfort, appetite is been fine and weight has been stable. He denies any issues with his bowels, no melena or hematochezia. Family history negative for colon cancer and polyps as far as he is aware. Family history positive for cirrhosis in his mother and his maternal aunt both alcohol-related. No prior history of hepatitis, he does have tattoos and has prior history of drug use, denies IV drug use. He has been drinking at least 2 beers per day over the past 10 years or so. He  denies heavier use. Most recent labs showed liver enzymes within normal limits, WBC of 7.1, hemoglobin 14.3 hematocrit of 42.8, platelets 194, total cholesterol 103, triglycerides 67 HDL 50. No previous abdominal imaging   Review of Systems Pertinent positive and negative review of systems were noted in the above HPI section.  All other review of systems was otherwise negative.  Outpatient Encounter Prescriptions as of 11/26/2016  Medication Sig  . amLODipine (NORVASC) 10 MG tablet Take 1 tablet (10 mg total) by mouth daily.  Marland Kitchen apixaban (ELIQUIS) 5 MG TABS tablet Take 1 tablet (5 mg total) by mouth 2 (two) times daily.  Marland Kitchen atorvastatin (LIPITOR) 40 MG tablet Take 1 tablet (40 mg total) by mouth daily.  Derrill Memo ON 12/11/2016] HYDROcodone-acetaminophen (NORCO) 5-325 MG tablet Take 1 tablet by mouth every 6 (six) hours as needed for moderate pain.  . nicotine (NICODERM CQ - DOSED IN MG/24  HOURS) 21 mg/24hr patch Place 1 patch (21 mg total) onto the skin daily.  . traMADol (ULTRAM) 50 MG tablet Take 1 tablet (50 mg total) by mouth every 8 (eight) hours.  . Na Sulfate-K Sulfate-Mg Sulf 17.5-3.13-1.6 GM/180ML SOLN Take 1 kit by mouth once.   No facility-administered encounter medications on file as of 11/26/2016.    No Known Allergies Patient Active Problem List   Diagnosis Date Noted  . Acute pulmonary embolus ([place])   . Primary osteoarthritis of left knee 07/28/2016  . Chest pain   . Abnormal EKG   . Pulmonary embolism ([place]) 06/03/2016  . Degenerative arthritis of right knee 08/08/2015  . Encounter for chronic pain management 04/06/2014  . Tobacco abuse 12/05/2013  . DVT (deep venous thrombosis) ([place]) 11/28/2013  . Candidate for statin therapy due to risk of future cardiovascular event 05/05/2013  . HTN (hypertension) 05/04/2013  . Bilateral knee pain 05/04/2013  . Lumbar back pain 05/04/2013  . Healthcare maintenance 05/04/2013  . BMI 33.0-33.9,adult 05/04/2013  . Hx of substance abuse 05/04/2013   Social History   Social History  . Marital status: Married    Spouse name: N/A  . Number of children: N/A  . Years of education: N/A   Occupational History  . Not on file.   Social History Main Topics  . Smoking status: Current Every Day Smoker    Packs/day: 0.25    Years: 35.00    Types: Cigarettes    Start date: 10/07/1976  . Smokeless  tobacco: Never Used     Comment: Stopped for up to 5 years total - Peak rate 2ppd  . Alcohol use Yes     Comment: 1-2 drinks per event, few times per week  . Drug use: Yes    Types: Marijuana, Cocaine     Comment: Cocaine last used in 2004, current marijuana use  . Sexual activity: Yes    Birth control/ protection: None     Comment: With wife   Other Topics Concern  . Not on file   Social History Narrative   Lives in [place].   Chickens as pet.    Hobbies: Radiographer, therapeutic Pulmonary (11/24/16):    Originally from [place]. Has worked in Architect, Ambulance person, Press photographer, & in a warehouse. No known asbestos exposure. Previously had chickens as pets. No mold exposure.     Mr. Shimabukuro family history includes Alcohol abuse in his brother and mother; Arthritis in his father, mother, other, and other; Asthma in his cousin; Clotting disorder in his mother; Depression in his mother; Drug abuse in his brother; Heart disease in his mother, other, and other; Hypertension in his mother; Kidney disease in his mother; Sickle cell anemia in his brother.      Objective:    Vitals:   11/26/16 0836  BP: 120/88  Pulse: 83    Physical Exam well-developed African-American male in no acute distress, pleasant blood pressure 120/88 pulse 83, height 6 foot 3 weight 253 BMI 31.2 HEENT; nontraumatic normocephalic EOMI PERRLA sclera anicteric, Cardiovascular ;regular rate and rhythm with S1-S2 no murmur or gallop, Pulmonary clear bilaterally, Abdomen; obese soft nontender nondistended no palpable mass or hepatosplenomegaly bowel sounds present, Rectal; exam not done, Extremities ;no clubbing cyanosis or edema skin warm and dry, Neuropsych; mood and affect appropriate     Assessment & Plan:   #47 51 year old African-American male referred for evaluation of hepatic steatosis. Patient certainly at increased risk given daily EtOH use. No recent abdominal imaging #2 colon cancer screening-average risk, #3 hyperlipidemia #4 chronic anticoagulation-on Eliquis #5 history of DVT/bilateral PE March 2018  Plan; Patient will be scheduled for upper abdominal ultrasound Check hepatic panel, hepatitis B and C serologies Patient will be scheduled for colonoscopy with [name]. Procedure discussed in detail with patient including risks and benefits and he is agreeable to proceed. We will schedule for October at which point patient will be 7 months out from PE. Patient will need to hold eliquis for 24-48 hours prior to  colonoscopy. We will communicate with his cardiologist and/or PCP to assure that holding eliquis for 24-48 hours prior to colonoscopy is reasonable for this patient. Patient was advised to decrease daily EtOH intake to no more than 1 beer per day.   Ashlynn Gunnels S Anavey Coombes PA-C 11/26/2016   Cc: Tonette Bihari, MD

## 2016-11-26 NOTE — Telephone Encounter (Signed)
His PCP monitors his anticoagulation for PE so need clearance from them

## 2016-11-26 NOTE — Telephone Encounter (Signed)
Pam, please send letter to pt's PCP about holding Eliquis

## 2016-11-26 NOTE — Telephone Encounter (Signed)
11/26/2016   RE: Jeffrey [name] DOB: 03/22/1966 MRN: [id number]   Dear Dr. Fransico Him,    We have scheduled the above patient for an endoscopic procedure. Our records show that he is on anticoagulation therapy.   Please advise as to how long the patient may come off his therapy of Eliquis prior to the procedure, which is scheduled for 01-15-2017.  Please  route the Eliquis clearance instructions to [facility] CMA.  Sincerely,    Amy Esterwood PA-C

## 2016-11-26 NOTE — Patient Instructions (Addendum)
Please go to the basement level to have your labs drawn.  .Decrease Alcohol intake.  You have been scheduled for a colonoscopy. Please follow written instructions given to you at your visit today.  Please pick up your prep supplies at the pharmacy within the next 1-3 days. If you use inhalers (even only as needed), please bring them with you on the day of your procedure. Your physician has requested that you go to www.startemmi.com and enter the access code given to you at your visit today. This web site gives a general overview about your procedure. However, you should still follow specific instructions given to you by our office regarding your preparation for the procedure.  You have been scheduled for an abdominal ultrasound at [facility] (1st floor of hospital) on Wednesday 9-5 at 10:00 am. Please arrive at 9:45 am  to your appointment for registration. Make certain not to have anything to eat or drink 6 hours prior to your appointment. Should you need to reschedule your appointment, please contact radiology at [phone]. This test typically takes about 30 minutes to perform.

## 2016-11-27 LAB — HEPATITIS C ANTIBODY: HCV Ab: NONREACTIVE

## 2016-11-27 LAB — HEPATITIS B SURFACE ANTIBODY,QUALITATIVE: Hep B S Ab: NONREACTIVE

## 2016-11-27 LAB — HEPATITIS B SURFACE ANTIGEN: Hepatitis B Surface Ag: NONREACTIVE

## 2016-11-27 NOTE — Progress Notes (Signed)
Reviewed and agree with documentation and assessment and plan. K. Veena Nameer Summer , MD   

## 2016-12-01 ENCOUNTER — Ambulatory Visit (HOSPITAL_COMMUNITY)
Admission: RE | Admit: 2016-12-01 | Discharge: 2016-12-01 | Disposition: A | Discharge status: Home / Self Care | Payer: 59 | Source: Ambulatory Visit | Attending: Pulmonary Disease | Admitting: Pulmonary Disease

## 2016-12-01 ENCOUNTER — Ambulatory Visit (HOSPITAL_BASED_OUTPATIENT_CLINIC_OR_DEPARTMENT_OTHER)
Admission: RE | Admit: 2016-12-01 | Discharge: 2016-12-01 | Disposition: A | Discharge status: Home / Self Care | Payer: 59 | Source: Ambulatory Visit | Attending: Pulmonary Disease | Admitting: Pulmonary Disease

## 2016-12-01 DIAGNOSIS — Z720 Tobacco use: Secondary | ICD-10-CM | POA: Insufficient documentation

## 2016-12-01 DIAGNOSIS — I82401 Acute embolism and thrombosis of unspecified deep veins of right lower extremity: Secondary | ICD-10-CM

## 2016-12-01 DIAGNOSIS — I2510 Atherosclerotic heart disease of native coronary artery without angina pectoris: Secondary | ICD-10-CM | POA: Insufficient documentation

## 2016-12-01 DIAGNOSIS — J432 Centrilobular emphysema: Secondary | ICD-10-CM | POA: Diagnosis not present

## 2016-12-01 DIAGNOSIS — I2609 Other pulmonary embolism with acute cor pulmonale: Secondary | ICD-10-CM | POA: Diagnosis not present

## 2016-12-01 DIAGNOSIS — K760 Fatty (change of) liver, not elsewhere classified: Secondary | ICD-10-CM | POA: Insufficient documentation

## 2016-12-01 DIAGNOSIS — J438 Other emphysema: Secondary | ICD-10-CM | POA: Diagnosis not present

## 2016-12-01 DIAGNOSIS — J439 Emphysema, unspecified: Secondary | ICD-10-CM | POA: Diagnosis not present

## 2016-12-01 DIAGNOSIS — I700 Atherosclerosis of aorta: Secondary | ICD-10-CM | POA: Insufficient documentation

## 2016-12-01 DIAGNOSIS — R918 Other nonspecific abnormal finding of lung field: Secondary | ICD-10-CM | POA: Insufficient documentation

## 2016-12-01 LAB — ALPHA-1 ANTITRYPSIN PHENOTYPE: A1 ANTITRYPSIN: 151 mg/dL (ref 83–199)

## 2016-12-01 NOTE — Telephone Encounter (Signed)
Sent anticoagulation letter to Dr. Fransico Him on 11-26-2016. We need clearance for the Eliquis.

## 2016-12-01 NOTE — Progress Notes (Signed)
VASCULAR LAB PRELIMINARY  PRELIMINARY  PRELIMINARY  PRELIMINARY  Right lower extremity venous duplex completed.    Preliminary report:   Right - Positive for chronic DVT of the posterior tibial, popliteal, femoral, and common femoral veins. 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 No evidence of a Baker's cyst. No significant change from study of 2017.  Ravin Denardo, [place], RVS 12/01/2016, 2:46 PM

## 2016-12-02 ENCOUNTER — Ambulatory Visit (HOSPITAL_COMMUNITY)
Admission: RE | Admit: 2016-12-02 | Discharge: 2016-12-02 | Disposition: A | Discharge status: Home / Self Care | Payer: 59 | Source: Ambulatory Visit | Attending: Physician Assistant | Admitting: Physician Assistant

## 2016-12-02 DIAGNOSIS — K760 Fatty (change of) liver, not elsewhere classified: Secondary | ICD-10-CM

## 2016-12-02 DIAGNOSIS — K769 Liver disease, unspecified: Secondary | ICD-10-CM | POA: Diagnosis not present

## 2016-12-03 NOTE — Telephone Encounter (Signed)
Pam - Dr Tressia Miners turner is not managing Jeffrey [name] for this pt - the pt's PCP is prescribing - letter needs to go to them

## 2016-12-04 ENCOUNTER — Telehealth: Payer: Self-pay | Admitting: *Deleted

## 2016-12-04 ENCOUNTER — Other Ambulatory Visit: Payer: Self-pay | Admitting: *Deleted

## 2016-12-04 NOTE — Telephone Encounter (Signed)
Routed the anticoagulation letter to the patient's primary care provider, Dr. Tonette Bihari.

## 2016-12-04 NOTE — Telephone Encounter (Signed)
12/04/2016   RE: DARUS HERSHMAN DOB: 1965/08/17 MRN: [id number]   Dear Dr. Tonette Bihari,    We have scheduled the above patient for an endoscopic procedure, . Our records show that he is on anticoagulation therapy.   Please advise as to how long the patient may come off his therapy of Eliquis prior to the procedure, which is scheduled for 01-15-2017.  Please  route the Eliquis clearance instructions to [facility] CMA.   Sincerely,   Amy Esterwood PA-C

## 2016-12-04 NOTE — Telephone Encounter (Signed)
12/04/2016   RE: SUSANO CLECKLER DOB: 1965-04-30 MRN: [id number]   Dear Dr. Tera Partridge,    We have scheduled the above patient for an endoscopic procedure. Our records show that he is on anticoagulation therapy.   Please advise as to how long the patient may come off his therapy of Eliquis prior to the procedure, which is scheduled for 01-15-2017.  Please route the Eliquis clearance information to [facility] CMA.    Sincerely,    Amy Esterwood PA-C

## 2016-12-04 NOTE — Telephone Encounter (Signed)
Dear Jeffrey [name],   As patient is followed by pulmonology for acute pulmonary embolism, they would be more appropriate to approach regarding his Eliquis treatment   Thanks  Charleene Callegari

## 2016-12-06 NOTE — Telephone Encounter (Signed)
The patient has a chronic DVT with a recent Duplex and the best way would be to admit the patient with transition to a Heparin drip allowing the procedure to be preformed and biopsies as well if necessary, resuming heparin afterward and ensuring hemostasis before restarting his oral anticoagulation. Jeffrey [name].

## 2016-12-07 ENCOUNTER — Telehealth: Payer: Self-pay | Admitting: *Deleted

## 2016-12-07 NOTE — Telephone Encounter (Signed)
Agree with proceeding with Cologaurd given average risk for colorectal cancer screening. Thanks

## 2016-12-07 NOTE — Telephone Encounter (Signed)
See other telephone note.  

## 2016-12-07 NOTE — Telephone Encounter (Signed)
Called the patient to advise we think it is best to cancel the colonoscopy scheduled for 01-15-2017 with [name].  Dr. Creig Hines Nestor/Pulmology does not want the patient to come off his blook thinner, Eliquis. Amy Esterwood had me order a Cologuard test. I explained this to the patient and told him to expect a kit to come to his home by [employer].  He will have his wife come to our office to pick up the instructions.  I did explain if the test results come back positive he will have to have a colonoscopy.  If negative he will not need to have a traditional colonoscopy.

## 2016-12-07 NOTE — Telephone Encounter (Signed)
Pt had large PE in 06/2016- Pulmonary does not want him to come off anticoagulation. The Colonoscopy was being scheduled for screening in average  Risk pt . He is asymptomatic .  I think best to cancel colonoscopy for now and have him do a Cologuard for screening.  Please cancel  colonoscopy  Which was scheduled with Dr Silverio Decamp, and call pt and discuss Cologuard, and order . Thank you

## 2016-12-07 NOTE — Telephone Encounter (Signed)
Faxed the Cologuard form to [employer] on 12-07-2016.

## 2016-12-09 ENCOUNTER — Telehealth: Payer: Self-pay | Admitting: *Deleted

## 2016-12-09 NOTE — Telephone Encounter (Signed)
Pharmacist from [facility] called stating patient would like Rx Hydrocodone filled today. Rx was predated for 12/11/16.  Pharmacy stated they can fill control meds at least 2 days in advance. Please give them a call. Derl Barrow, RN

## 2016-12-09 NOTE — Telephone Encounter (Addendum)
I have called the prescription for the patient to fill early. Thanks Corrina Steffensen

## 2016-12-10 ENCOUNTER — Encounter: Payer: Self-pay | Admitting: Hematology and Oncology

## 2016-12-10 ENCOUNTER — Telehealth: Payer: Self-pay | Admitting: Hematology and Oncology

## 2016-12-10 MED FILL — HYDROCODON-APAP 5-325: 5-325 | 12 days supply | Qty: 50 | Fill #0

## 2016-12-10 NOTE — Telephone Encounter (Signed)
Appt has been scheduled to see Dr. Lebron Conners on 9/26 at 11am. Pt aware to arrive 30 minutes early. Address and insurance verified. Letter mailed to the pt.

## 2016-12-10 NOTE — Telephone Encounter (Signed)
Pt is calling to get his pain medication picked up today instead of tomorrow. jw

## 2016-12-22 ENCOUNTER — Ambulatory Visit (INDEPENDENT_AMBULATORY_CARE_PROVIDER_SITE_OTHER): Payer: 59 | Admitting: Pulmonary Disease

## 2016-12-22 DIAGNOSIS — Z720 Tobacco use: Secondary | ICD-10-CM

## 2016-12-22 DIAGNOSIS — R918 Other nonspecific abnormal finding of lung field: Secondary | ICD-10-CM

## 2016-12-22 LAB — PULMONARY FUNCTION TEST
DL/VA % pred: 87 %
DL/VA: 4.17 ml/min/mmHg/L
DLCO COR % PRED: 71 %
DLCO COR: 25.77 ml/min/mmHg
DLCO UNC: 26.67 ml/min/mmHg
DLCO unc % pred: 73 %
FEF 25-75 POST: 2.86 L/s
FEF 25-75 Pre: 2.35 L/sec
FEF2575-%Change-Post: 21 %
FEF2575-%PRED-PRE: 67 %
FEF2575-%Pred-Post: 82 %
FEV1-%Change-Post: 3 %
FEV1-%PRED-POST: 86 %
FEV1-%Pred-Pre: 83 %
FEV1-POST: 3.12 L
FEV1-Pre: 3.03 L
FEV1FVC-%Change-Post: 0 %
FEV1FVC-%PRED-PRE: 94 %
FEV6-%Change-Post: 2 %
FEV6-%Pred-Post: 91 %
FEV6-%Pred-Pre: 89 %
FEV6-POST: 4.07 L
FEV6-Pre: 3.95 L
FEV6FVC-%Change-Post: 0 %
FEV6FVC-%PRED-POST: 102 %
FEV6FVC-%Pred-Pre: 103 %
FVC-%Change-Post: 2 %
FVC-%PRED-POST: 89 %
FVC-%PRED-PRE: 87 %
FVC-POST: 4.08 L
FVC-PRE: 3.99 L
POST FEV1/FVC RATIO: 77 %
PRE FEV1/FVC RATIO: 76 %
Post FEV6/FVC ratio: 100 %
Pre FEV6/FVC Ratio: 100 %
RV % pred: 99 %
RV: 2.23 L
TLC % PRED: 84 %
TLC: 6.36 L

## 2016-12-22 NOTE — Progress Notes (Signed)
PFT done today. 

## 2016-12-23 ENCOUNTER — Telehealth: Payer: Self-pay | Admitting: Hematology and Oncology

## 2016-12-23 ENCOUNTER — Telehealth: Payer: Self-pay

## 2016-12-23 ENCOUNTER — Encounter: Payer: Self-pay | Admitting: Hematology and Oncology

## 2016-12-23 ENCOUNTER — Ambulatory Visit (HOSPITAL_BASED_OUTPATIENT_CLINIC_OR_DEPARTMENT_OTHER): Payer: 59

## 2016-12-23 ENCOUNTER — Ambulatory Visit (HOSPITAL_BASED_OUTPATIENT_CLINIC_OR_DEPARTMENT_OTHER): Payer: 59 | Admitting: Hematology and Oncology

## 2016-12-23 VITALS — BP 151/99 | HR 81 | Temp 98.9°F | Resp 18 | Ht 75.5 in | Wt 253.9 lb

## 2016-12-23 DIAGNOSIS — I2609 Other pulmonary embolism with acute cor pulmonale: Secondary | ICD-10-CM | POA: Diagnosis not present

## 2016-12-23 DIAGNOSIS — I825Y1 Chronic embolism and thrombosis of unspecified deep veins of right proximal lower extremity: Secondary | ICD-10-CM

## 2016-12-23 DIAGNOSIS — Z7901 Long term (current) use of anticoagulants: Secondary | ICD-10-CM | POA: Diagnosis not present

## 2016-12-23 LAB — COMPREHENSIVE METABOLIC PANEL
ALT: 30 U/L (ref 0–55)
AST: 29 U/L (ref 5–34)
Albumin: 3.7 g/dL (ref 3.5–5.0)
Alkaline Phosphatase: 84 U/L (ref 40–150)
Anion Gap: 7 mEq/L (ref 3–11)
BUN: 8.8 mg/dL (ref 7.0–26.0)
CHLORIDE: 105 meq/L (ref 98–109)
CO2: 26 meq/L (ref 22–29)
Calcium: 9.6 mg/dL (ref 8.4–10.4)
Creatinine: 1 mg/dL (ref 0.7–1.3)
EGFR: >90 mL/min/{1.73_m2} (ref >90)
Glucose: 91 mg/dl (ref 70–140)
Potassium: 3.9 mEq/L (ref 3.5–5.1)
Sodium: 138 mEq/L (ref 136–145)
TOTAL PROTEIN: 7.8 g/dL (ref 6.4–8.3)
Total Bilirubin: 0.48 mg/dL (ref 0.20–1.20)

## 2016-12-23 LAB — CBC WITH DIFFERENTIAL/PLATELET
BASO%: 0.8 % (ref 0.0–2.0)
Basophils Absolute: 0.1 10*3/uL (ref 0.0–0.1)
EOS%: 3.9 % (ref 0.0–7.0)
Eosinophils Absolute: 0.3 10*3/uL (ref 0.0–0.5)
HEMATOCRIT: 47.8 % (ref 38.4–49.9)
HGB: 15.7 g/dL (ref 13.0–17.1)
LYMPH#: 1.9 10*3/uL (ref 0.9–3.3)
LYMPH%: 28.6 % (ref 14.0–49.0)
MCH: 30.1 pg (ref 27.2–33.4)
MCHC: 32.9 g/dL (ref 32.0–36.0)
MCV: 91.6 fL (ref 79.3–98.0)
MONO#: 0.7 10*3/uL (ref 0.1–0.9)
MONO%: 10 % (ref 0.0–14.0)
NEUT%: 56.7 % (ref 39.0–75.0)
NEUTROS ABS: 3.7 10*3/uL (ref 1.5–6.5)
Platelets: 172 10*3/uL (ref 140–400)
RBC: 5.22 10*6/uL (ref 4.20–5.82)
RDW: 13.9 % (ref 11.0–14.6)
WBC: 6.5 10*3/uL (ref 4.0–10.3)

## 2016-12-23 MED ORDER — ENOXAPARIN SODIUM 150 MG/ML ~~LOC~~ SOLN: 120.0000 mg | Freq: Two times a day (BID) | SUBCUTANEOUS | 0 refills | Status: DC

## 2016-12-23 MED FILL — ENOXAPARIN 120 MG/0.8 ML SY: 120 | 2 days supply | Qty: 3 | Fill #0

## 2016-12-23 NOTE — Telephone Encounter (Signed)
Question received from [place] regarding dosage of lovenox. Pt to receive 120mg  in each injection. Pharmacy was able to provide that dosage in each vial instead of 150, which was the closest ordering option. Confirmed medication, dose, and dispense amount with pharmacist per Dr. Lebron Conners.

## 2016-12-23 NOTE — Telephone Encounter (Signed)
Scheduled appt per 9/26 los - Gave patient AVS and calender per los. - per pt request f/u on 10/11 same day as other doctors apts.

## 2016-12-24 LAB — PROTEIN C ACTIVITY: Protein C-Functional: 57 % — ABNORMAL LOW (ref 73–180)

## 2016-12-24 LAB — LUPUS ANTICOAGULANT PANEL
PTT-LA: 31.6 s (ref 0.0–51.9)
dRVVT: 37.7 s (ref 0.0–47.0)

## 2016-12-24 LAB — ANTITHROMBIN III ANTIGEN: AT III AG PPP IMM-ACNC: 68 % — ABNORMAL LOW (ref 72–124)

## 2016-12-24 LAB — PROTEIN C, TOTAL: PROTEIN C ANTIGEN: 63 % (ref 60–150)

## 2016-12-24 LAB — ANTITHROMBIN III: Antithrombin Activity: 95 % (ref 75–135)

## 2016-12-24 LAB — PROTEIN S, ANTIGEN, FREE: PROTEIN S AG FREE: 79 % (ref 57–157)

## 2016-12-24 LAB — ANTINUCLEAR ANTIBODIES, IFA: ANTINUCLEAR ANTIBODIES, IFA: NEGATIVE

## 2016-12-24 LAB — PROTEIN S ACTIVITY: PROTEIN S ACTIVITY: 87 % (ref 63–140)

## 2016-12-24 LAB — RHEUMATOID FACTOR

## 2016-12-25 LAB — BETA-2-GLYCOPROTEIN I ABS, IGG/M/A
Beta-2 Glyco 1 IgA: <9 GPI IgA units (ref 0–25)
Beta-2 Glycoprotein I Ab, IgG: <9 GPI IgG units (ref 0–20)

## 2016-12-26 LAB — CARDIOLIPIN ANTIBODIES, IGG, IGM, IGA
Anticardiolipin Ab,IgA,Qn: <9 APL U/mL (ref 0–11)
Anticardiolipin Ab,IgM,Qn: <9 MPL U/mL (ref 0–12)

## 2016-12-28 DIAGNOSIS — Z1212 Encounter for screening for malignant neoplasm of rectum: Secondary | ICD-10-CM | POA: Diagnosis not present

## 2016-12-28 DIAGNOSIS — Z1211 Encounter for screening for malignant neoplasm of colon: Secondary | ICD-10-CM | POA: Diagnosis not present

## 2016-12-28 LAB — FACTOR 5 LEIDEN

## 2016-12-28 LAB — PROTHROMBIN GENE MUTATION

## 2016-12-29 NOTE — Assessment & Plan Note (Signed)
51 y.o. 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 Most recent assessment demonstrates recovery of the right ventricle following anticoagulation with Apixaban (Eliquis), but persistence of a chronic deep vein thrombosis and superficial vein thrombosis in the right lower extremity.  Recommendations: --Indefinite anticoagulation --Recommend colonoscopy, low-dose CT Chest for cancer screening --Any procedure will need to be bridged.   --For a colonoscopy, hold Eliquis 48hrs prior to procedure and place the patient on enoxaparin 1mg /kg BID. Hold enoxaparin 24hrs prior to the procedure and resume right after.  --For any surgical intervention, hold Apixaban (Eliquis) with enoxaparin bridge as above, but resumption of anticoagulation will be left to the discretion of the operating surgeon based on the bleeding risk of any particular surgery. If patient is hospitalized, heparin drip may be the initial anticoagulant choice with transition to Apixaban (Eliquis) once bleeding risk subsided sufficiently in the opinion of the surgeon.  Plan: --Continue Apixaban (Eliquis) indefinitely --I'll obtain hypercoagulable panel as well as antiphospholipid antibody syndrome workup --We'll provide Lovenox prescription for the anticipated surgery --RTC 1 month after the surgery to review the findings and to monitor anticoagulation

## 2016-12-29 NOTE — Progress Notes (Signed)
[name] New Visit:  Assessment: DVT (deep venous thrombosis) ([name]) 51 y.o. 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 Most recent assessment demonstrates recovery of the right ventricle following anticoagulation with Apixaban (Eliquis), but persistence of a chronic deep vein thrombosis and superficial vein thrombosis in the right lower extremity.  Recommendations: --Indefinite anticoagulation --Recommend colonoscopy, low-dose CT Chest for cancer screening --Any procedure will need to be bridged.   --For a colonoscopy, hold Eliquis 48hrs prior to procedure and place the patient on enoxaparin 13m/kg BID. Hold enoxaparin 24hrs prior to the procedure and resume right after.  --For any surgical intervention, hold Apixaban (Eliquis) with enoxaparin bridge as above, but resumption of anticoagulation will be left to the discretion of the operating surgeon based on the bleeding risk of any particular surgery. If patient is hospitalized, heparin drip may be the initial anticoagulant choice with transition to Apixaban (Eliquis) once bleeding risk subsided sufficiently in the opinion of the surgeon.  Plan: --Continue Apixaban (Eliquis) indefinitely --I'll obtain hypercoagulable panel as well as antiphospholipid antibody syndrome workup --We'll provide Lovenox prescription for the anticipated surgery --RTC 1 month after the surgery to review the findings and to monitor anticoagulation  Voice recognition software was used and creation of this note. Despite my best effort at editing the text, some misspelling/errors may have occurred.  Orders Placed This Encounter  Procedures  . CBC with Differential     Standing Status:   Future    Number of Occurrences:   1    Standing Expiration Date:   12/23/2017  . Comprehensive metabolic panel    Standing Status:   Future    Number of Occurrences:   1    Standing Expiration Date:   12/23/2017  . Antithrombin III*    Standing Status:   Future    Number of Occurrences:   1    Standing Expiration Date:   12/23/2017  . Antithrombin III antigen    Standing Status:   Future    Number of Occurrences:   1    Standing Expiration Date:   12/23/2017  . Beta-2-glycoprotein i abs, IgG/M/A    Standing Status:   Future    Number of Occurrences:   1    Standing Expiration Date:   12/23/2017  . Cardiolipin antibodies, IgG, IgM, IgA*    Standing Status:   Future    Number of Occurrences:   1    Standing Expiration Date:   12/23/2017  . Factor 5 Leiden*    Standing Status:   Future    Number of Occurrences:   1    Standing Expiration Date:   12/23/2017  . Lupus anticoagulant panel*    Standing Status:   Future    Number of Occurrences:   1    Standing Expiration Date:   12/23/2017  . Protein C activity*    Standing Status:   Future    Number of Occurrences:   1    Standing Expiration Date:   12/23/2017  . Protein C, total*    Standing Status:   Future    Number of Occurrences:   1    Standing Expiration Date:   12/23/2017  . Protein S activity*    Standing Status:  Future    Number of Occurrences:   1    Standing Expiration Date:   12/23/2017  . Protein S, Antigen, Free*    Standing Status:   Future    Number of Occurrences:   1    Standing Expiration Date:   12/23/2017  . Prothrombin gene mutation*    Standing Status:   Future    Number of Occurrences:   1    Standing Expiration Date:   12/23/2017  . PROTEIN S PANEL, Total, Free, Functional Protein S    Standing Status:   Future    Number of Occurrences:   1    Standing Expiration Date:   12/23/2017  . ANA, IFA (with reflex)    Standing Status:   Future    Number of Occurrences:   1    Standing  Expiration Date:   12/23/2017  . Rheumatoid factor    Standing Status:   Future    Number of Occurrences:   1    Standing Expiration Date:   12/23/2017    All questions were answered.  . The patient knows to call the clinic with any problems, questions or concerns.  This note was electronically signed.    History of Presenting Illness [name] 51 y.o. presenting to the [name] for Evaluation and treatment recommendations for recent deep vein thrombosis and pulmonary embolism. The initial event in September 2015. 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 The injury was followed by a period of immobility due to recurrent flights between [place] in [facility]. Once the clots were diagnosed, patient received Rivaroxaban (Xarelto) for 12 months. Subsequently medication pulsatile, but the patient had recurrent swelling in the right lower extremity medication was restarted without additional imaging in January 2017 and patient was continued on that, but he is compliance has been somewhat intermittent. In Feb-Mar 2018, patient has presented with sharp left-sided chest pain. Presentation was preceded by extensive travel on process between [place], [place], and [place] with associated stress. Evaluation in the emergency room on 06/03/16 demonstrated EKG changes, subsequent imaging demonstrated presence of bilateral pulmonary emboli with possible left upper lobe pulmonary infarct. Patient initially was treated with heparin drip and was transitioned to Apixaban (Eliquis) subsequently. Has been on the medication since discharge and denies any new symptoms. Denies any chest pain or shortness of breath at the present time. No pain in the lower extremities, but does have persistent swelling in the right leg.  Denies other respiratory, cardiovascular, gastrointestinal, or genitourinary complaints.  Oncological/hematological  History: --Doppler US, 11/28/13: Acute deep vein thrombosis in the right lower extremity, acute superficial venous thrombosis in the right lower extremity --CTA Chest, 06/03/16: Large burden of pulmonary emboli with bilateral distal main arteries involved and left upper lobe infarction. --TTE, 06/04/16: LVEF 55-60%, moderate RV dilation with decreased systolic function confirming presence of RV strain --TTE, 09/22/16: Left ventricular ejection fraction 60-65%, right ventricular size and function normalized --Doppler US, 12/01/16: Chronic deep vein thrombosis in the right lower extremity involving posterior tibialis, popliteal, femoral, and common femoral veins. Chronic superficial venous thrombosis present as well --CT Chest, 12/01/16: Interval resolution of the left upper lobe consolidation  Medical History: Past Medical History:  Diagnosis Date  . Acute pulmonary embolus ([facility])    bilateral submassive PE in setting of missing several doses of Xarelto for his DVT  . DVT (deep venous thrombosis) ([place]) 11/28/2013  RT LEG  . Hypertension   . Marijuana use     Surgical History: Past Surgical History:  Procedure Laterality Date  . HERNIA REPAIR     2005 and 2011  . KNEE SURGERY Bilateral    Left knee 1993, right knee 2004    Family History: Family History  Problem Relation Age of Onset  . Heart disease Other   . Arthritis Other   . Alcohol abuse Mother   . Heart disease Mother   . Depression Mother   . Hypertension Mother   . Kidney disease Mother   . Arthritis Mother   . Clotting disorder Mother   . Arthritis Father   . Alcohol abuse Brother   . Drug abuse Brother   . Sickle cell anemia Brother   . Arthritis Maternal Grandmother   . Heart disease Maternal Grandmother   . Arthritis Maternal Grandfather   . Heart disease Maternal Grandfather   . Asthma Cousin     Social History: Social History   Social History  . Marital status: Married    Spouse name: N/A  . Number  of children: N/A  . Years of education: N/A   Occupational History  . Not on file.   Social History Main Topics  . Smoking status: Current Every Day Smoker    Packs/day: 0.25    Years: 35.00    Types: Cigarettes    Start date: 10/07/1976  . Smokeless tobacco: Never Used     Comment: Stopped for up to 5 years total - Peak rate 2ppd  . Alcohol use 7.2 oz/week    12 Cans of beer per week     Comment: seldom wine/liquor  . Drug use: Yes    Types: Marijuana, Cocaine     Comment: Cocaine last used in 2004, current marijuana use  . Sexual activity: Yes    Birth control/ protection: None     Comment: With wife   Other Topics Concern  . Not on file   Social History Narrative   Lives in [place].   Chickens as pet.    Hobbies: Radiographer, therapeutic Pulmonary (11/24/16):   Originally from [place]. Has worked in Architect, Ambulance person, Press photographer, & in a warehouse. No known asbestos exposure. Previously had chickens as pets. No mold exposure.     Allergies: No Known Allergies  Medications:  Current Outpatient Prescriptions  Medication Sig Dispense Refill  . amLODipine (NORVASC) 10 MG tablet Take 1 tablet (10 mg total) by mouth daily. 90 tablet 3  . apixaban (ELIQUIS) 5 MG TABS tablet Take 1 tablet (5 mg total) by mouth 2 (two) times daily. 60 tablet 3  . atorvastatin (LIPITOR) 40 MG tablet Take 1 tablet (40 mg total) by mouth daily. 30 tablet 0  . enoxaparin (LOVENOX) 150 MG/ML injection Inject 0.8 mLs (120 mg total) into the skin every 12 (twelve) hours. 3.2 mL 0  . HYDROcodone-acetaminophen (NORCO) 5-325 MG tablet Take 1 tablet by mouth every 6 (six) hours as needed for moderate pain. 50 tablet 0  . nicotine (NICODERM CQ - DOSED IN MG/24 HOURS) 21 mg/24hr patch Place 1 patch (21 mg total) onto the skin daily. 14 patch 0  . traMADol (ULTRAM) 50 MG tablet Take 1 tablet (50 mg total) by mouth every 8 (eight) hours. 90 tablet 2   No current facility-administered medications for this  visit.     Review of Systems: Review of Systems  All other systems reviewed and  are negative.    PHYSICAL EXAMINATION Blood pressure (!) 151/99, pulse 81, temperature 98.9 F (37.2 C), temperature source Oral, resp. rate 18, height 6' 3.5" (1.918 m), weight 253 lb 14.4 oz (115.2 kg), SpO2 100 %.  ECOG PERFORMANCE STATUS: 0 - Asymptomatic  Physical Exam  Constitutional: He is oriented to person, place, and time and well-developed, well-nourished, and in no distress. No distress.  HENT:  Head: Normocephalic and atraumatic.  Mouth/Throat: Oropharynx is clear and moist. No oropharyngeal exudate.  Eyes: Conjunctivae and EOM are normal. No scleral icterus.  Neck: Normal range of motion. No thyromegaly present.  Cardiovascular: Normal rate, regular rhythm and normal heart sounds.   No murmur heard. Pulmonary/Chest: Effort normal and breath sounds normal. No respiratory distress. He has no wheezes. He has no rales.  Abdominal: Soft. Bowel sounds are normal. He exhibits no distension and no mass. There is no tenderness. There is no rebound.  Musculoskeletal: Normal range of motion. He exhibits no tenderness.  Mild swelling in the right lower extremity without pitting edema. No palpable vascular cords  Lymphadenopathy:       Head (right side): No submandibular and no occipital adenopathy present.       Head (left side): No submandibular and no occipital adenopathy present.    He has no cervical adenopathy.    He has no axillary adenopathy.       Right: No inguinal and no supraclavicular adenopathy present.       Left: No inguinal and no supraclavicular adenopathy present.  Neurological: He is alert and oriented to person, place, and time. He has normal reflexes. No cranial nerve deficit.  Skin: Skin is warm and dry. No rash noted. He is not diaphoretic. No erythema. No pallor.     LABORATORY DATA: I have personally reviewed the data as listed: Appointment on 12/23/2016  Component Date  Value Ref Range Status  . WBC 12/23/2016 6.5  4.0 - 10.3 10e3/uL Final  . NEUT# 12/23/2016 3.7  1.5 - 6.5 10e3/uL Final  . HGB 12/23/2016 15.7  13.0 - 17.1 g/dL Final  . HCT 12/23/2016 47.8  38.4 - 49.9 % Final  . Platelets 12/23/2016 172  140 - 400 10e3/uL Final  . MCV 12/23/2016 91.6  79.3 - 98.0 fL Final  . MCH 12/23/2016 30.1  27.2 - 33.4 pg Final  . MCHC 12/23/2016 32.9  32.0 - 36.0 g/dL Final  . RBC 12/23/2016 5.22  4.20 - 5.82 10e6/uL Final  . RDW 12/23/2016 13.9  11.0 - 14.6 % Final  . lymph# 12/23/2016 1.9  0.9 - 3.3 10e3/uL Final  . MONO# 12/23/2016 0.7  0.1 - 0.9 10e3/uL Final  . Eosinophils Absolute 12/23/2016 0.3  0.0 - 0.5 10e3/uL Final  . Basophils Absolute 12/23/2016 0.1  0.0 - 0.1 10e3/uL Final  . NEUT% 12/23/2016 56.7  39.0 - 75.0 % Final  . LYMPH% 12/23/2016 28.6  14.0 - 49.0 % Final  . MONO% 12/23/2016 10.0  0.0 - 14.0 % Final  . EOS% 12/23/2016 3.9  0.0 - 7.0 % Final  . BASO% 12/23/2016 0.8  0.0 - 2.0 % Final  . Sodium 12/23/2016 138  136 - 145 mEq/L Final  . Potassium 12/23/2016 3.9  3.5 - 5.1 mEq/L Final  . Chloride 12/23/2016 105  98 - 109 mEq/L Final  . CO2 12/23/2016 26  22 - 29 mEq/L Final  . Glucose 12/23/2016 91  70 - 140 mg/dl Final   Glucose reference range is for nonfasting patients.  Fasting glucose reference range is 70- 100.  Marland Kitchen BUN 12/23/2016 8.8  7.0 - 26.0 mg/dL Final  . Creatinine 12/23/2016 1.0  0.7 - 1.3 mg/dL Final  . Total Bilirubin 12/23/2016 0.48  0.20 - 1.20 mg/dL Final  . Alkaline Phosphatase 12/23/2016 84  40 - 150 U/L Final  . AST 12/23/2016 29  5 - 34 U/L Final  . ALT 12/23/2016 30  0 - 55 U/L Final  . Total Protein 12/23/2016 7.8  6.4 - 8.3 g/dL Final  . Albumin 12/23/2016 3.7  3.5 - 5.0 g/dL Final  . Calcium 12/23/2016 9.6  8.4 - 10.4 mg/dL Final  . Anion Gap 12/23/2016 7  3 - 11 mEq/L Final  . EGFR 12/23/2016 >90  >90 ml/min/1.73 m2 Final   eGFR is calculated using the CKD-EPI Creatinine Equation (2009)  . Antithrombin Activity  12/23/2016 95  75 - 135 % Final   Comment: Direct Xa inhibitor anticoagulants such as rivaroxaban, apixaban and edoxaban will lead to spuriously elevated antithrombin activity levels possibly masking a deficiency.   . AT III AG PPP IMM-ACNC 12/23/2016 68* 72 - 124 % Final   Comment: This test was developed and its performance characteristics determined by [facility]. It has not been cleared or approved by the Food and Drug Administration.   . Beta-2 Glycoprotein I Ab, IgG 12/23/2016 <9  0 - 20 GPI IgG units Final   Comment: The reference interval reflects a 3SD or 99th percentile interval, which is thought to represent a potentially clinically significant result in accordance with the International Consensus Statement on the classification criteria for definitive antiphospholipid syndrome (APS). J Thromb Haem 2006;4:295-306.   . Beta-2 Glyco 1 IgA 12/23/2016 <9  0 - 25 GPI IgA units Final   Comment: The reference interval reflects a 3SD or 99th percentile interval, which is thought to represent a potentially clinically significant result in accordance with the International Consensus Statement on the classification criteria for definitive antiphospholipid syndrome (APS). J Thromb Haem 2006;4:295-306.   . Beta-2 Glyco 1 IgM 12/23/2016 <9  0 - 32 GPI IgM units Final   Comment: The reference interval reflects a 3SD or 99th percentile interval, which is thought to represent a potentially clinically significant result in accordance with the International Consensus Statement on the classification criteria for definitive antiphospholipid syndrome (APS). J Thromb Haem 2006;4:295-306.   Marland Kitchen Anticardiolipin Ab,IgG,Qn 12/23/2016 <9  0 - 14 GPL U/mL Final   Comment:                                          Negative:              <15                                          Indeterminate:     15 - 20                                          Low-Med Positive: >20 - 80  High Positive:         >80   . Anticardiolipin Ab,IgM,Qn 12/23/2016 <9  0 - 12 MPL U/mL Final   Comment:                                          Negative:              <13                                          Indeterminate:     13 - 20                                          Low-Med Positive: >20 - 80                                          High Positive:         >80   . Anticardiolipin Ab,IgA,Qn 12/23/2016 <9  0 - 11 APL U/mL Final   Comment:                                          Negative:              <12                                          Indeterminate:     12 - 20                                          Low-Med Positive: >20 - 80                                          High Positive:         >80   . Factor V Leiden 12/23/2016 Comment   Final   Comment: Result:  Negative (no mutation found) Factor V Leiden is a specific mutation (R506Q) in the factor V gene that is associated with an increased risk of venous thrombosis. Factor V Leiden is more resistant to inactivation by activated protein C.  As a result, factor V persists in the circulation leading to a mild hyper- coagulable state.  The Leiden mutation accounts for 90% - 95% of APC resistance.  Factor V Leiden has been reported in patients with deep vein thrombosis, pulmonary embolus, central retinal vein occlusion, cerebral sinus thrombosis and hepatic vein thrombosis. Other risk factors to be considered in the workup for venous thrombosis include the G20210A mutation in the factor II (prothrombin) gene, protein S and C deficiency, and antithrombin deficiencies. Anticardiolipin antibody and lupus anticoagulant analysis may be appropriate  for certain patients, as well as homocysteine levels. Contact your local [facility] for information on how to order additional tes                          ting if desired. **Genetic counselors are available for health care providers to**   discuss results at  [phone]). Methodology: DNA analysis of the Factor V gene was performed by allele-specific PCR. The diagnostic sensitivity and specificity is >99% for both. Molecular-based testing is highly accurate, but as in any laboratory test, diagnostic errors may occur. All test results must be combined with clinical information for the most accurate interpretation. This test was developed and its performance characteristics determined by [facility]. It has not been cleared or approved by the Food and Drug Administration. References: Voelkerding K (1996).  Clin Lab Med [phone]. Allison Quarry, PhD, [facility] Ruben Reason, PhD, [facility] Annetta Maw, M.S., PhD, [facility] Alfredo Bach, PhD, [facility] Norva Riffle, PhD, [facility] Earlean Polka PhD, [facility]   . PTT-LA 12/23/2016 31.6  0.0 - 51.9 sec Final  . dRVVT 12/23/2016 37.7  0.0 - 47.0 sec Final  . Lupus Reflex Interpretation 12/23/2016 Comment:   Final   No lupus anticoagulant was detected.  . Protein C-Functional 12/23/2016 57* 73 - 180 % Final   Comment: A deficiency of protein C (PC), either congenital or acquired, increases the risk of thromboembolism. Acquired PC deficiency occurs more frequently than congenital deficiency. PC levels can be transiently diminished after a thrombotic event or surgery or in the presence of certain anticoagulants. Heparin, direct Xa inhibitor, or thrombotic inhibitor therapy does not alter PC levels physiologically and does not interfere with this assay because it is chromogenic and clot-based. Vitamin K antagonist therapy may decrease plasma levels of functional protein C (PC) as PC is a vitamin K-dependent protein. Vitamin K deficiency, due to dietary insufficiency or malabsorption will also lead to reduced PC levels. Acquired deficiency can be found in individuals with disseminated intravascular coagulation (DIC) and sepsis. Severe hepatic disorders (hepatitis, cirrhosis, etc.), nephrotic  syndrome, malignancy and inflammatory bowel disease can lead to diminished PC levels. Drug therapy                           with L-asparaginse or fluorouracil can also reduce PC levels. Levels may be decreased in patients with polycythemia vera, sickle cell disease and essential thrombocythemia. Repeat evaluation on a new plasma sample to confirm or refute this result should be considered, after ruling out acquired causes, depending on the clinical scenario.   . Protein C Antigen 12/23/2016 63  60 - 150 % Final  . Protein S-Functional 12/23/2016 87  63 - 140 % Final   Comment: Protein S activity may be falsely increased (masking an abnormal, low result) in patients receiving direct Xa inhibitor (e.g., rivaroxaban, apixaban, edoxaban) or a direct thrombin inhibitor (e.g., dabigatran) anticoagulant treatment due to assay interference by these drugs.   . Protein S, Free 12/23/2016 79  57 - 157 % Final   Comment: This test was developed and its performance characteristics determined by [facility]. It has not been cleared or approved by the Food and Drug Administration.   . Factor II, DNA Analysis 12/23/2016 Comment   Final   Comment: NEGATIVE No mutation identified. Comment: A point mutation (G20210A) in the factor II (prothrombin) gene is the second most common cause of inherited thrombophilia. The incidence of this  mutation in the [place] Caucasian population is about 2% and in the [place] American population it is approximately 0.5%. This mutation is rare in the [place] and Native American population. Being heterozygous for a prothrombin mutation increases the risk for developing venous thrombosis about 2 to 3 times above the general population risk. Being homozygous for the prothrombin gene mutation increases the relative risk for venous thrombosis further, although it is not yet known how much further the risk is increased. In women heterozygous for the prothrombin gene mutation, the use  of estrogen containing oral contraceptives increases the relative risk of venous thrombosis about 16 times and the risk of developing cerebral thrombosis is also significantly increased. In pregnancy the prothrombin                           gene mutation increases risk for venous thrombosis and may increase risk for stillbirth, placental abruption, pre-eclampsia and fetal growth restriction. If the patient possesses two or more congenital or acquired thrombophilic risk factors, the risk for thrombosis may rise to more than the sum of the risk ratios for the individual mutations. This assay detects only the prothrombin G20210A mutation and does not measure genetic abnormalities elsewhere in the genome. Other thrombotic risk factors may be pursued through systematic clinical laboratory analysis. These factors include the R506Q (Leiden) mutation in the Factor V gene, plasma homocysteine levels, as well as testing for deficiencies of antithrombin III, protein C and protein S. Genetic Counselors are available for health care providers to discuss results at [phone]). Methodology: DNA analysis of the Factor II gene was performed by PCR amplification followed by restriction analysis. The diagnostic                           sensitivity is >99% for both. All the tests must be combined with clinical information for the most accurate interpretation. Molecular-based testing is highly accurate, but as in any laboratory test, diagnostic errors may occur. This test was developed and its performance characteristics determined by [facility]. It has not been cleared or approved by the Food and Drug Administration. Poort SR, et al. Blood. 1996; 38:3291-9166. Varga EA. Circulation. 2004; 060:O45-T97. Mervin Hack, et [place]; 19:700-703. Allison Quarry, PhD, [facility] Ruben Reason, PhD, [facility] Annetta Maw, M.S., PhD, [name] Alfredo Bach, PhD,  [facility] Norva Riffle, PhD, [facility] Earlean Polka, PhD, [facility]   . ANA Titer 1 12/23/2016 Negative   Final   Comment:                                                     Negative   <1:80                                                     Borderline  1:80  Positive   >1:80   . RA Latex Turbid. 12/23/2016 <10.0  0.0 - 13.9 IU/mL Final  Clinical Support on 12/22/2016  Component Date Value Ref Range Status  . FVC-Pre 12/22/2016 3.99  L Preliminary  . FVC-%Pred-Pre 12/22/2016 87  % Preliminary  . FVC-Post 12/22/2016 4.08  L Preliminary  . FVC-%Pred-Post 12/22/2016 89  % Preliminary  . FVC-%Change-Post 12/22/2016 2  % Preliminary  . FEV1-Pre 12/22/2016 3.03  L Preliminary  . FEV1-%Pred-Pre 12/22/2016 83  % Preliminary  . FEV1-Post 12/22/2016 3.12  L Preliminary  . FEV1-%Pred-Post 12/22/2016 86  % Preliminary  . FEV1-%Change-Post 12/22/2016 3  % Preliminary  . FEV6-Pre 12/22/2016 3.95  L Preliminary  . FEV6-%Pred-Pre 12/22/2016 89  % Preliminary  . FEV6-Post 12/22/2016 4.07  L Preliminary  . FEV6-%Pred-Post 12/22/2016 91  % Preliminary  . FEV6-%Change-Post 12/22/2016 2  % Preliminary  . Pre FEV1/FVC ratio 12/22/2016 76  % Preliminary  . FEV1FVC-%Pred-Pre 12/22/2016 94  % Preliminary  . Post FEV1/FVC ratio 12/22/2016 77  % Preliminary  . FEV1FVC-%Change-Post 12/22/2016 0  % Preliminary  . Pre FEV6/FVC Ratio 12/22/2016 100  % Preliminary  . FEV6FVC-%Pred-Pre 12/22/2016 103  % Preliminary  . Post FEV6/FVC ratio 12/22/2016 100  % Preliminary  . FEV6FVC-%Pred-Post 12/22/2016 102  % Preliminary  . FEV6FVC-%Change-Post 12/22/2016 0  % Preliminary  . FEF 25-75 Pre 12/22/2016 2.35  L/sec Preliminary  . FEF2575-%Pred-Pre 12/22/2016 67  % Preliminary  . FEF 25-75 Post 12/22/2016 2.86  L/sec Preliminary  . FEF2575-%Pred-Post 12/22/2016 82  % Preliminary  . FEF2575-%Change-Post 12/22/2016 21  % Preliminary  . RV 12/22/2016 2.23  L Preliminary  .  RV % pred 12/22/2016 99  % Preliminary  . TLC 12/22/2016 6.36  L Preliminary  . TLC % pred 12/22/2016 84  % Preliminary  . DLCO unc 12/22/2016 26.67  ml/min/mmHg Preliminary  . DLCO unc % pred 12/22/2016 73  % Preliminary  . DLCO cor 12/22/2016 25.77  ml/min/mmHg Preliminary  . DLCO cor % pred 12/22/2016 71  % Preliminary  . DL/VA 12/22/2016 4.17  ml/min/mmHg/L Preliminary  . DL/VA % pred 12/22/2016 87  % Preliminary   CBC      Ardath Sax, MD

## 2016-12-31 ENCOUNTER — Ambulatory Visit (INDEPENDENT_AMBULATORY_CARE_PROVIDER_SITE_OTHER): Payer: 59 | Admitting: Internal Medicine

## 2016-12-31 ENCOUNTER — Encounter: Payer: Self-pay | Admitting: Internal Medicine

## 2016-12-31 VITALS — BP 130/98 | HR 107 | Temp 98.3°F | Ht 75.5 in | Wt 249.0 lb

## 2016-12-31 DIAGNOSIS — M1712 Unilateral primary osteoarthritis, left knee: Secondary | ICD-10-CM | POA: Diagnosis not present

## 2016-12-31 DIAGNOSIS — Z23 Encounter for immunization: Secondary | ICD-10-CM | POA: Diagnosis not present

## 2016-12-31 DIAGNOSIS — I2609 Other pulmonary embolism with acute cor pulmonale: Secondary | ICD-10-CM

## 2016-12-31 DIAGNOSIS — Z0000 Encounter for general adult medical examination without abnormal findings: Secondary | ICD-10-CM

## 2016-12-31 DIAGNOSIS — I2699 Other pulmonary embolism without acute cor pulmonale: Secondary | ICD-10-CM

## 2016-12-31 DIAGNOSIS — I10 Essential (primary) hypertension: Secondary | ICD-10-CM | POA: Diagnosis not present

## 2016-12-31 MED ORDER — OXYCODONE-ACETAMINOPHEN 2.5-325 MG PO TABS: 1.0000 | ORAL_TABLET | ORAL | 0 refills | Status: DC | PRN

## 2016-12-31 MED ORDER — ALBUTEROL SULFATE HFA 108 (90 BASE) MCG/ACT IN AERS: 2.0000 | INHALATION_SPRAY | Freq: Four times a day (QID) | RESPIRATORY_TRACT | 2 refills | Status: DC | PRN

## 2016-12-31 MED ORDER — OXYCODONE-ACETAMINOPHEN 2.5-325 MG PO TABS: 1.0000 | ORAL_TABLET | Freq: Two times a day (BID) | ORAL | 0 refills | Status: DC | PRN

## 2016-12-31 MED FILL — VENTOLIN HFA 90 MCG INHALER: 108 (90 BAS | 25 days supply | Qty: 18 | Fill #0

## 2016-12-31 NOTE — Progress Notes (Deleted)
   [facility] Kerrin Mo, MD Phone: [phone]  Reason For Visit:   # *** -   Past Medical History Reviewed problem list.  Medications- reviewed and updated No additions to family history Social history- patient is a *** smoker  Objective: BP (!) 130/98   Pulse (!) 107   Temp 98.3 F (36.8 C) (Oral)   Ht 6' 3.5" (1.918 m)   Wt 249 lb (112.9 kg)   SpO2 97%   BMI 30.71 kg/m  Gen: NAD, alert, cooperative with exam HEENT: Normal    Neck: No masses palpated. No lymphadenopathy    Ears: Tympanic membranes intact, normal light reflex, no erythema, no bulging    Eyes: PERRLA, EOMI    Nose: nasal turbinates moist    Throat: moist mucus membranes, no erythema Cardio: regular rate and rhythm, S1S2 heard, no murmurs appreciated Pulm: clear to auscultation bilaterally, no wheezes, rhonchi or rales GI: soft, non-tender, non-distended, bowel sounds present, no hepatomegaly, no splenomegaly GU: external vaginal tissue ***, cervix ***, *** punctate lesions on cervix appreciated, *** discharge from cervical os, *** bleeding, *** cervical motion tenderness, *** abdominal/ adnexal masses Extremities: warm, well perfused, No edema, cyanosis or clubbing;  MSK: Normal gait and station Skin: dry, intact, no rashes or lesions Neuro: Strength and sensation grossly intact   Assessment/Plan: See problem based a/p  No problem-specific Assessment & Plan notes found for this encounter.

## 2016-12-31 NOTE — Progress Notes (Signed)
   [facility] Kerrin Mo, MD Phone: [phone]  Reason For Visit: follow-up   # CHRONIC HTN: No issues with Norvasc, did not take medication today. States he takes it at 10:30 AM every day Reports good compliance, took meds today. Tolerating well, w/o complaints. Lifestyle - discussed decreasing smoking; patient is down to 3 cigarettes a day Denies CP, dyspnea, HA, edema, dizziness / lightheadedness  #knee replacement surgery -Patient had a large PE. Is being followed by pulmonology and hematology. Wants to have knee replacement surgery. Has seen cardiology in the past. Has not followed up with them for clearance.  # PE  - Has been taking his Eliquis twice daily - following with pulmonology  - Denies any SOB, leg swelling, chest pain, no bleeding in stools, no nose bleeds   Past Medical History Reviewed problem list.  Medications- reviewed and updated No additions to family history Social history- patient is a smoker  Objective: BP (!) 130/98   Pulse (!) 107   Temp 98.3 F (36.8 C) (Oral)   Ht 6' 3.5" (1.918 m)   Wt 249 lb (112.9 kg)   SpO2 97%   BMI 30.71 kg/m  Gen: NAD, alert, cooperative with exam Cardio: regular rate and rhythm, S1S2 heard, no murmurs appreciated Pulm: clear to auscultation bilaterally, no wheezes, rhonchi or rales Skin: dry, intact, no rashes or lesions  Assessment/Plan: See problem based a/p  HTN (hypertension) Blood pressure elevated today. Patient has not yet taken his blood pressure medication. -Continue Norvasc. -Patient is trying to quit smoking now down to 3 cigarettes a day  Primary osteoarthritis of left knee Patient is planning on getting up left knee replacement surgery Needs to follow up with cardiology for clearance.  Acute pulmonary embolus (HCC) Will continue Eliquis, No issues with this medication. Following with pulmonology

## 2016-12-31 NOTE — Patient Instructions (Signed)
For surgery clearance you need to follow up with cardiology. I also think you need to discuss length of time for recovery following surgery with orthopedics have a more clear idea of recovery time that is needed.

## 2017-01-04 NOTE — Assessment & Plan Note (Signed)
Blood pressure elevated today. Patient has not yet taken his blood pressure medication. -Continue Norvasc. -Patient is trying to quit smoking now down to 3 cigarettes a day

## 2017-01-04 NOTE — Assessment & Plan Note (Signed)
Patient is planning on getting up left knee replacement surgery Needs to follow up with cardiology for clearance.

## 2017-01-04 NOTE — Assessment & Plan Note (Signed)
Will continue Eliquis, No issues with this medication. Following with pulmonology

## 2017-01-04 NOTE — Assessment & Plan Note (Signed)
Sent in FOBT today in the mail Holding off on colonoscopy given recent PE

## 2017-01-05 ENCOUNTER — Other Ambulatory Visit: Payer: Self-pay

## 2017-01-05 LAB — COLOGUARD: Cologuard: NEGATIVE

## 2017-01-06 ENCOUNTER — Ambulatory Visit (INDEPENDENT_AMBULATORY_CARE_PROVIDER_SITE_OTHER): Payer: 59 | Admitting: Pulmonary Disease

## 2017-01-06 ENCOUNTER — Encounter: Payer: Self-pay | Admitting: Pulmonary Disease

## 2017-01-06 ENCOUNTER — Ambulatory Visit: Payer: 59 | Admitting: Hematology and Oncology

## 2017-01-06 ENCOUNTER — Ambulatory Visit (INDEPENDENT_AMBULATORY_CARE_PROVIDER_SITE_OTHER): Payer: 59 | Admitting: *Deleted

## 2017-01-06 VITALS — BP 138/98 | HR 84 | Ht 75.0 in | Wt 254.8 lb

## 2017-01-06 DIAGNOSIS — F17200 Nicotine dependence, unspecified, uncomplicated: Secondary | ICD-10-CM | POA: Diagnosis not present

## 2017-01-06 DIAGNOSIS — I2699 Other pulmonary embolism without acute cor pulmonale: Secondary | ICD-10-CM | POA: Diagnosis not present

## 2017-01-06 DIAGNOSIS — J439 Emphysema, unspecified: Secondary | ICD-10-CM | POA: Diagnosis not present

## 2017-01-06 DIAGNOSIS — I2609 Other pulmonary embolism with acute cor pulmonale: Secondary | ICD-10-CM

## 2017-01-06 DIAGNOSIS — I82401 Acute embolism and thrombosis of unspecified deep veins of right lower extremity: Secondary | ICD-10-CM | POA: Diagnosis not present

## 2017-01-06 NOTE — Progress Notes (Signed)
Subjective:    [name], male    DOB: October 31, 1965, 51 y.o.   MRN: [id number]  C.C.:  Follow-up for Pulmonary Embolism with Acute Cor Pulmonale, Right Lower Extremity DVT, Pulmonary Emphysema, Left Upper Lobe Opacity, & Tobacco Use Disorder.   HPI Preoperative risk assessment: Patient planned for left knee replacement.  Acute pulmonary embolism: Referred to hematology at last appointment. Initially found on CT angiogram 06/03/16. No history of immobility. Likely due to missed doses of Xarelto in the setting of DVT. Hematology recommends indefinite anticoagulation with Eliquis. They also recommended bridging for any procedures utilizing Lovenox. Denies any dyspnea, coughing or wheezing.   Acute cor pulmonale: Likely secondary to pulmonary embolism. Resolved on repeat echocardiogram.  Right lower extremity DVT: DVT still present on venous duplex as noted below. Appears somewhat chronic in nature per vascular surgery interpretation.  Left upper lobe opacity: Resolved on repeat imaging. Likely secondary to infarction.  Pulmonary emphysema: Likely secondary to tobacco use. No evidence of alpha-1 antitrypsin deficiency. No evidence of COPD on pulmonary function testing.   Tobacco use disorder: At last appointment patient was smoking 5-6 cigarettes daily. He is down to 3 cigarettes daily. He plans to start patches eventually.   Review of Systems No chest pain, pressure or tightness. No melena, hematochezia, or hematuria. No abdominal pain. No fever, chills, or sweats.   No Known Allergies  Current Outpatient Prescriptions on File Prior to Visit  Medication Sig Dispense Refill  . albuterol (PROVENTIL HFA;VENTOLIN HFA) 108 (90 Base) MCG/ACT inhaler Inhale 2 puffs into the lungs every 6 (six) hours as needed for wheezing or shortness of breath. 1 Inhaler 2  . amLODipine (NORVASC) 10 MG tablet Take 1 tablet (10 mg total) by mouth daily. 90 tablet 3  . apixaban (ELIQUIS) 5 MG TABS  tablet Take 1 tablet (5 mg total) by mouth 2 (two) times daily. 60 tablet 3  . atorvastatin (LIPITOR) 40 MG tablet Take 1 tablet (40 mg total) by mouth daily. 30 tablet 0  . nicotine (NICODERM CQ - DOSED IN MG/24 HOURS) 21 mg/24hr patch Place 1 patch (21 mg total) onto the skin daily. 14 patch 0  . [START ON 01/08/2017] oxycodone-acetaminophen (PERCOCET) 2.5-325 MG tablet Take 1 tablet by mouth 2 (two) times daily as needed for pain. 50 tablet 0  . traMADol (ULTRAM) 50 MG tablet Take 1 tablet (50 mg total) by mouth every 8 (eight) hours. 90 tablet 2  . enoxaparin (LOVENOX) 150 MG/ML injection Inject 0.8 mLs (120 mg total) into the skin every 12 (twelve) hours. 3.2 mL 0   No current facility-administered medications on file prior to visit.     Past Medical History:  Diagnosis Date  . Acute pulmonary embolus (HCC)    bilateral submassive PE in setting of missing several doses of Xarelto for his DVT  . DVT (deep venous thrombosis) ([place]) 11/28/2013   RT LEG  . Hypertension   . Marijuana use     Past Surgical History:  Procedure Laterality Date  . HERNIA REPAIR     2005 and 2011  . KNEE SURGERY Bilateral    Left knee 1993, right knee 2004    Family History  Problem Relation Age of Onset  . Heart disease Other   . Arthritis Other   . Alcohol abuse Mother   . Heart disease Mother   . Depression Mother   . Hypertension Mother   . Kidney disease Mother   . Arthritis Mother   .  Clotting disorder Mother   . Arthritis Father   . Alcohol abuse Brother   . Drug abuse Brother   . Sickle cell anemia Brother   . Arthritis Maternal Grandmother   . Heart disease Maternal Grandmother   . Arthritis Maternal Grandfather   . Heart disease Maternal Grandfather   . Asthma Cousin     Social History   Social History  . Marital status: Married    Spouse name: N/A  . Number of children: N/A  . Years of education: N/A   Social History Main Topics  . Smoking status: Current Every Day  Smoker    Packs/day: 0.25    Years: 35.00    Types: Cigarettes    Start date: 10/07/1976  . Smokeless tobacco: Never Used     Comment: Stopped for up to 5 years total - Peak rate 2ppd  . Alcohol use 7.2 oz/week    12 Cans of beer per week     Comment: seldom wine/liquor  . Drug use: Yes    Types: Marijuana, Cocaine     Comment: Cocaine last used in 2004, current marijuana use  . Sexual activity: Yes    Birth control/ protection: None     Comment: With wife   Other Topics Concern  . None   Social History Narrative   Lives in [place].   Chickens as pet.    Hobbies: Radiographer, therapeutic Pulmonary (11/24/16):   Originally from [place]. Has worked in Architect, Ambulance person, Press photographer, & in a warehouse. No known asbestos exposure. Previously had chickens as pets. No mold exposure.       Objective:   Physical Exam BP (!) 138/98 (BP Location: Left Arm, Patient Position: Sitting, Cuff Size: Large)   Pulse 84   Ht 6\' 3"  (1.905 m)   Wt 254 lb 12.8 oz (115.6 kg)   SpO2 97%   BMI 31.85 kg/m   General:  Awake. Alert. No distress. Integument:  No rash. No bruising. Warm. Dry. Extremities:  No cyanosis or clubbing.  HEENT:  No scleral icterus. Moist membranes. No oral ulcers Cardiovascular:  Regular rate. Regular rhythm. Continue trace right lower extremity edema.  Pulmonary:  Normal work of breathing on room air. Clear with auscultation. Abdomen: Soft. Normal bowel sounds. Nondistended.  Musculoskeletal:  Normal bulk and tone. No joint deformity or effusion appreciated. Neurological:  Cranial nerves 2-12 grossly in tact. No meningismus. Moving all 4 extremities equally.   PFT 12/22/16: FVC 3.90 L (87%) FEV1 3.03 L (83%) FEV1/FVC 0.76 FEF 25-75 2.35 L (67%) negative bronchodilator response TLC 6.36 L (84%) RV 99% ERV 166% DLCO corrected 71%  6MWT 01/06/17:  Walked 369 meters / Baseline Sat 100% on RA / Nadir Sat 97% on RA  IMAGING CT CHEST W/O 12/01/16 (personally reviewed by  me):  Resolved left upper lobe opacity. Hepatic steatosis noted. Apical predominant centrilobular and paraseptal emphysematous changes. No new developing nodule or opacity. No pleural effusion or thickening. No pericardial effusion. No pathologic mediastinal adenopathy.  VENOUS DUPLEX RIGHT LOWER EXTREMITY 12/01/16 (per vascular surgery):  Chronic DVT involving posterior tibial, popliteal, femoral, and common femoral veins of the right lower extremity. Findings consistent with chronic superficial thrombosis of the greater saphenous vein and saphenofemoral junction.  CTA CHEST 06/03/16 (previously reviewed by me with the patient): Pulmonary embolism involving distal main pulmonary arteries extending into all lobes. Clot burden mild to large with RV/LV ratio 1.59. Segmental consolidation with air bronchograms and associated  groundglass consistent with infarction within the left upper lobe. Apical predominant emphysematous changes with some subpleural bleb formation particularly in the right lung apex. No pleural effusion or thickening. No pericardial effusion. No pathologic mediastinal adenopathy.  CARDIAC TTE (09/22/16):  LV normal in size with moderate LVH. EF 60-65%. No regional wall motion abnormalities and normal diastolic function. LA & RA normal in size. RV normal in size and function. No aortic stenosis or regurgitation. Aortic root normal in size. No mitral stenosis or regurgitation. Trivial pulmonic regurgitation without stenosis. Mild tricuspid regurgitation. No pericardial effusion.  TTE (06/04/16): LV normal in size with moderate LVH. EF 55-60% with normal regional wall motion & grade 1 diastolic dysfunction. LA normal in size & RA moderately dilated. RV moderately dilated with mild reduction in systolic function. No aortic stenosis or regurgitation. Aortic root normal in size. No mitral stenosis or regurgitation. Flattening of the ventricular septum during systole consistent with RV pressure overload.  No pulmonic stenosis. No tricuspid regurgitation. Pulmonary artery normal in size. No pericardial effusion.  LABS 11/24/16 Alpha-1 antitrypsin:  MZ-Pratt (151 - not a deficiency allele) D-dimer: 2.71  11/10/16 BMP: 137/4.4/102/22/9/0.86/85/9.0 CBC: 7.1/14.3/42.8/194    Assessment & Plan:  51 y.o. male with acute pulmonary embolism. Venous duplex continues to show a chronic right lower extremity DVT. His cor pulmonale resolved on repeat echo and his lung opacity on CT imaging resolved as well likely representing infarction. Patient is continuing on systemic anticoagulation with plans of left total knee replacement. He has no symptoms from his underlying pulmonary emphysema and his pulmonary function testing shows normal spirometry, lung volumes, and carbonated anoxic effusion capacity without hypoxia or significant desaturation during his 6 minute walk test. I strongly encouraged the patient to consider complete tobacco cessation to prevent worsening of his pulmonary function and eventual development of COPD. I instructed him to contact our office if there were any further questions or concerns before the next appointment.  1. Acute pulmonary embolism:  Continuing on lifelong systemic anticoagulation as recommended by hematology. 2. Acute cor pulmonale: Secondary to pulmonary embolism. Resolved. 3. Right lower extremity DVT: Appears to have some element of chronicity. Continuing on lifelong anticoagulation. 4. Left upper lobe opacity: Resolved. Likely due to infarction. 5. Pulmonary emphysema: No evidence of COPD on pulmonary function testing. Patient counseled to stop using tobacco. 6. Tobacco use disorder: Counseled for over 3 minutes and need for complete tobacco cessation. Patient has a plan on quitting smoking. 7. Preoperative risk assessment: Patient is at a moderate risk of perioperative pulmonary complications given his chronic right lower extremity DVT and need for systemic anticoagulation.  This should not preclude his ability to undergo the proposed surgery with appropriate precautions and planning as outlined by hematology. 8. Health maintenance: Status post Pneumovax March 2018, Tdap October 2018, & High Dose Flu Vaccine October 2018. 9. Follow-up: Return to clinic in 6 months or sooner if needed.  Sonia Baller Ashok Cordia, M.D. [facility] Pager:  [phone] After 3pm or if no response, call [phone] 10:49 AM 01/06/17

## 2017-01-06 NOTE — Progress Notes (Signed)
SIX MIN WALK 01/06/2017  Medications No meds taken prior to walk test.   Supplimental Oxygen during Test? (L/min) No  Laps 7  Partial Lap (in Meters) 33  Baseline BP (sitting) 140/96  Baseline Heartrate 74  Baseline Dyspnea (Borg Scale) 1  Baseline Fatigue (Borg Scale) 2  Baseline SPO2 100  BP (sitting) 158/98  Heartrate 118  Dyspnea (Borg Scale) 3  Fatigue (Borg Scale) 3  SPO2 97  BP (sitting) 140/96  Heartrate 89  SPO2 100  Stopped or Paused before Six Minutes No  Interpretation (No Data)  Distance Completed 369

## 2017-01-06 NOTE — Patient Instructions (Addendum)
   Continue using your blood thinners as prescribed.  Call our office if you have any new breathing problems or questions before your next appointment.  We will see you back in 6 months or sooner if needed.

## 2017-01-08 MED FILL — OXYCODON-ACETAMINOPHEN 2.5-: 2.5-325 | 25 days supply | Qty: 50 | Fill #0

## 2017-01-11 ENCOUNTER — Telehealth (INDEPENDENT_AMBULATORY_CARE_PROVIDER_SITE_OTHER): Payer: Self-pay | Admitting: Orthopaedic Surgery

## 2017-01-11 NOTE — Telephone Encounter (Signed)
See message.

## 2017-01-11 NOTE — Telephone Encounter (Signed)
Patient called asked for a call back to discuss surgery. The number to contact patient is [phone]

## 2017-01-11 NOTE — Telephone Encounter (Signed)
Ok let's give him a call.  Thanks.

## 2017-01-12 ENCOUNTER — Telehealth: Payer: Self-pay

## 2017-01-12 ENCOUNTER — Ambulatory Visit (HOSPITAL_BASED_OUTPATIENT_CLINIC_OR_DEPARTMENT_OTHER): Payer: 59 | Admitting: Hematology and Oncology

## 2017-01-12 ENCOUNTER — Encounter: Payer: Self-pay | Admitting: Hematology and Oncology

## 2017-01-12 VITALS — BP 149/100 | HR 84 | Temp 98.4°F | Resp 18 | Ht 75.0 in | Wt 258.7 lb

## 2017-01-12 DIAGNOSIS — I2609 Other pulmonary embolism with acute cor pulmonale: Secondary | ICD-10-CM | POA: Diagnosis not present

## 2017-01-12 DIAGNOSIS — I825Y1 Chronic embolism and thrombosis of unspecified deep veins of right proximal lower extremity: Secondary | ICD-10-CM

## 2017-01-12 DIAGNOSIS — Z7901 Long term (current) use of anticoagulants: Secondary | ICD-10-CM | POA: Diagnosis not present

## 2017-01-12 NOTE — Telephone Encounter (Signed)
Can you please call patient to discuss

## 2017-01-12 NOTE — Telephone Encounter (Signed)
Call pt 

## 2017-01-12 NOTE — Telephone Encounter (Signed)
Printed avs and calender with upcoming appointment.per 10/16 los

## 2017-01-15 ENCOUNTER — Encounter: Payer: 59 | Admitting: Gastroenterology

## 2017-01-15 ENCOUNTER — Telehealth: Payer: Self-pay

## 2017-01-15 NOTE — Telephone Encounter (Signed)
Ok. Thank you.

## 2017-01-15 NOTE — Telephone Encounter (Signed)
Pt takes Eliquis for hx of DVT/PE. Per Dr Lebron Conners -   Last Assessment & Plan 12/23/2016 Office Visit Written 12/29/2016 11:07 AM by Ardath Sax, MD  51 y.o. 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 Most recent assessment demonstrates recovery of the right ventricle following anticoagulation with Apixaban (Eliquis), but persistence of a chronic deep vein thrombosis and superficial vein thrombosis in the right lower extremity.  Recommendations: --Indefinite anticoagulation --Recommend colonoscopy, low-dose CT Chest for cancer screening --Any procedure will need to be bridged.              --For a colonoscopy, hold Eliquis 48hrs prior to procedure and place the patient on enoxaparin 1mg /kg BID. Hold enoxaparin 24hrs prior to the procedure and resume right after.             --For any surgical intervention, hold Apixaban (Eliquis) with enoxaparin bridge as above, but resumption of anticoagulation will be left to the discretion of the operating surgeon based on the bleeding risk of any particular surgery. If patient is hospitalized, heparin drip may be the initial anticoagulant choice with transition to Apixaban (Eliquis) once bleeding risk subsided sufficiently in the opinion of the surgeon.   Pts typically need to hold Eliquis for 3 days prior to TKA. Per above recommendation, pt will require a Lovenox bridge prior to procedure.  Once pt has cardiac clearance, please schedule pt in Coumadin clinic for Lovenox bridge while off of Eliquis prior to TKA.

## 2017-01-15 NOTE — Telephone Encounter (Signed)
I reviewed the pt's chart and Dr Theodosia Blender office notes. He really doesn't need clearance from cardiology. He had a massive pulmonary embolism while off anticoagulation. A coronary CTA showed no significant obstructive CAD but a high Ca++ score and risk factor modification was recommended. The issue of bridging anticoagulation has been outlined by our pharmacist Ms Supple. He doesn't need cardiology clearance and he saw Dr Ashok Cordia 01/06/17 about pulmonary clearance.   Kerin Ransom PA-C 01/15/2017 4:43 PM

## 2017-01-15 NOTE — Telephone Encounter (Signed)
   [facility] Pre-operative Risk Assessment    Request for surgical clearance:  1. What type of surgery is being performed? L total knee arthroplasty  2. When is this surgery scheduled? TBD   3. Are there any medications that need to be held prior to surgery and how long? Please review and advise for cardiac clearance. The surgery will not be performed until clearance is received.  4. Practice name and name of physician performing surgery? [facility]. Doctor not specified.   5. What is your office phone and fax number? Phone: [phone]. Fax: [phone] attention sherrie.  6. Anesthesia type (None, local, MAC, general) ? Not specified.   Jeffrey [name] 01/15/2017, 4:09 PM  _________________________________________________________________   (provider comments below)

## 2017-01-18 ENCOUNTER — Encounter: Payer: Self-pay | Admitting: Hematology and Oncology

## 2017-01-18 NOTE — Telephone Encounter (Signed)
Please ensure pt is scheduled on Coumdain clinic schedule to discuss Lovenox bridge while off Eliquis, thanks!

## 2017-01-18 NOTE — Assessment & Plan Note (Signed)
51 y.o. 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 Most recent assessment demonstrates recovery of the right ventricle following anticoagulation with Apixaban (Eliquis), but persistence of a chronic deep vein thrombosis and superficial vein thrombosis in the right lower extremity.  We have conducted additional cancer screening appropriate for the patient's age revealing negative colorguard testing, and negative low-dose CT of the chest which nevertheless rose emphysema and hepatic steatosis. Additionally, we have obtained hypercoagulable panel that is negative for congenital mutations in the factor five or prothrombin gene. Testing is negative for evidence or report immune from birth file based on negative ANA, RF, DRVVT, anti-cardiolipin, and anti-beta-2 glycoprotein antibodies. Patient has normal levels of protein S and antithrombin. Protein C demonstrates decreased function with low-normal antigen level as either reaction to persistent thrombosis or as a possible protein C deficiency.thefinding of protein C deficiency does not change underlying recommendations.  Recommendations: --Indefinite anticoagulation --Any procedure will need to be bridged.   --For a colonoscopy, hold Eliquis 48hrs prior to procedure and place the patient on enoxaparin 1mg /kg BID. Hold enoxaparin 24hrs prior to the procedure and resume right after.  --For any surgical intervention, hold Apixaban (Eliquis) with enoxaparin bridge as above, but resumption of anticoagulation will be left to the discretion of the operating surgeon based on the bleeding risk of any particular surgery. If patient is hospitalized, heparin drip may be the initial anticoagulant  choice with transition to Apixaban (Eliquis) once bleeding risk subsided sufficiently in the opinion of the surgeon.

## 2017-01-18 NOTE — Telephone Encounter (Signed)
MESSAGE SENT TO SCHEDULING TO CALL PT AND SCHEDULE APPT

## 2017-01-18 NOTE — Progress Notes (Signed)
[facility] Follow-up Visit:  Assessment: Acute pulmonary embolus [facility]) 51 y.o. 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 Most recent assessment demonstrates recovery of the right ventricle following anticoagulation with Apixaban (Eliquis), but persistence of a chronic deep vein thrombosis and superficial vein thrombosis in the right lower extremity.  We have conducted additional cancer screening appropriate for the patient's age revealing negative colorguard testing, and negative low-dose CT of the chest which nevertheless rose emphysema and hepatic steatosis. Additionally, we have obtained hypercoagulable panel that is negative for congenital mutations in the factor five or prothrombin gene. Testing is negative for evidence or report immune from birth file based on negative ANA, RF, DRVVT, anti-cardiolipin, and anti-beta-2 glycoprotein antibodies. Patient has normal levels of protein S and antithrombin. Protein C demonstrates decreased function with low-normal antigen level as either reaction to persistent thrombosis or as a possible protein C deficiency.thefinding of protein C deficiency does not change underlying recommendations.  Recommendations: --Indefinite anticoagulation --Any procedure will need to be bridged.   --For a colonoscopy, hold Eliquis 48hrs prior to procedure and place the patient on enoxaparin 1mg /kg BID. Hold enoxaparin 24hrs prior to the procedure and resume right after.  --For any surgical intervention, hold Apixaban (Eliquis) with enoxaparin bridge as above, but resumption of anticoagulation will be left to the discretion of the operating surgeon based on the bleeding risk of any  particular surgery. If patient is hospitalized, heparin drip may be the initial anticoagulant choice with transition to Apixaban (Eliquis) once bleeding risk subsided sufficiently in the opinion of the surgeon.   --Return to clinic in one year for continued clinical monitoring --Voice recognition software was used and creation of this note. Despite my best effort at editing the text, some misspelling/errors may have occurred.   No orders of the defined types were placed in this encounter.   All questions were answered.  . The patient knows to call the clinic with any problems, questions or concerns.  This note was electronically signed.    History of Presenting Illness Jeffrey [name] is a 51 y.o. male followed in the [place] [facility] for Evaluation and treatment recommendations for recent deep vein thrombosis and pulmonary embolism. The initial event in September 2015. 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 The injury was followed by a period of immobility due to recurrent flights between [place] in [facility]. Once the clots were diagnosed, patient received Rivaroxaban (Xarelto) for 12 months. Subsequently medication pulsatile, but the patient had recurrent swelling in the right lower extremity medication was restarted without additional imaging in January 2017 and patient was continued on that, but he is compliance has been somewhat intermittent. In Feb-Mar 2018, patient has presented with sharp left-sided chest pain. Presentation was preceded by extensive travel on process between [place], [place], and [place] with associated stress. Evaluation in the emergency room on 06/03/16 demonstrated EKG changes, subsequent imaging demonstrated presence of bilateral pulmonary emboli with possible left upper lobe pulmonary infarct. Patient initially was treated with heparin drip and was transitioned to Apixaban (Eliquis)  subsequently. Has been on the medication since discharge and denies any new symptoms. Denies any chest pain or shortness of breath at the  present time. No pain in the lower extremities, but does have persistent swelling in the right leg. Denies other respiratory, cardiovascular, gastrointestinal, or genitourinary complaints.  Patient returns to the clinic for follow-up survey. In the interim, patient underwent colorguard testing which was negative, patient also had a low diversity of the chest on 12/01/16 demonstrating hepatic steatosis, pulmonary emphysema. Hypercoagulable panel obtained at last visit to the clinic was essentially negative with no congenital or acquired thrombophilia identified. Results are outlined below.  Oncological/hematological History: --Doppler US, 11/28/13: Acute deep vein thrombosis in the right lower extremity, acute superficial venous thrombosis in the right lower extremity --CTA Chest, 06/03/16: Large burden of pulmonary emboli with bilateral distal main arteries involved and left upper lobe infarction. --TTE, 06/04/16: LVEF 55-60%, moderate RV dilation with decreased systolic function confirming presence of RV strain --TTE, 09/22/16: Left ventricular ejection fraction 60-65%, right ventricular size and function normalized --Doppler US, 12/01/16: Chronic deep vein thrombosis in the right lower extremity involving posterior tibialis, popliteal, femoral, and common femoral veins. Chronic superficial venous thrombosis present as well --CT Chest, 12/01/16: Interval resolution of the left upper lobe consolidation. Pulmonary emphysema noted, hepatic steatosis. No pulmonary nodules. --Hypercoag Panel, 12/23/16: Negative APLS -- DRVVT, ACL Ab and anti-beta2 GP Ab; Protein C Fn 57%, Protein C Ag 63%, Protein S & AT wnl; ANA/RF -- negative; Negative for fV Leiden or PT gene mutations;   Medical History: Past Medical History:  Diagnosis Date  . Acute pulmonary embolus (HCC)     bilateral submassive PE in setting of missing several doses of Xarelto for his DVT  . DVT (deep venous thrombosis) ([place]) 11/28/2013   RT LEG  . Hypertension   . Marijuana use     Surgical History: Past Surgical History:  Procedure Laterality Date  . HERNIA REPAIR     2005 and 2011  . KNEE SURGERY Bilateral    Left knee 1993, right knee 2004    Family History: Family History  Problem Relation Age of Onset  . Heart disease Other   . Arthritis Other   . Alcohol abuse Mother   . Heart disease Mother   . Depression Mother   . Hypertension Mother   . Kidney disease Mother   . Arthritis Mother   . Clotting disorder Mother   . Arthritis Father   . Alcohol abuse Brother   . Drug abuse Brother   . Sickle cell anemia Brother   . Arthritis Maternal Grandmother   . Heart disease Maternal Grandmother   . Arthritis Maternal Grandfather   . Heart disease Maternal Grandfather   . Asthma Cousin     Social History: Social History   Social History  . Marital status: Married    Spouse name: N/A  . Number of children: N/A  . Years of education: N/A   Occupational History  . Not on file.   Social History Main Topics  . Smoking status: Current Every Day Smoker    Packs/day: 0.25    Years: 35.00    Types: Cigarettes    Start date: 10/07/1976  . Smokeless tobacco: Never Used     Comment: Stopped for up to 5 years total - Peak rate 2ppd  . Alcohol use 7.2 oz/week    12 Cans of beer per week     Comment: seldom wine/liquor  . Drug use: Yes    Types: Marijuana, Cocaine     Comment: Cocaine last used in 2004, current marijuana use  . Sexual activity: Yes  Birth control/ protection: None     Comment: With wife   Other Topics Concern  . Not on file   Social History Narrative   Lives in [place].   Chickens as pet.    Hobbies: Radiographer, therapeutic Pulmonary (11/24/16):   Originally from [place]. Has worked in Architect, Ambulance person, Press photographer, & in a warehouse. No known  asbestos exposure. Previously had chickens as pets. No mold exposure.     Allergies: No Known Allergies  Medications:  Current Outpatient Prescriptions  Medication Sig Dispense Refill  . albuterol (PROVENTIL HFA;VENTOLIN HFA) 108 (90 Base) MCG/ACT inhaler Inhale 2 puffs into the lungs every 6 (six) hours as needed for wheezing or shortness of breath. 1 Inhaler 2  . amLODipine (NORVASC) 10 MG tablet Take 1 tablet (10 mg total) by mouth daily. 90 tablet 3  . apixaban (ELIQUIS) 5 MG TABS tablet Take 1 tablet (5 mg total) by mouth 2 (two) times daily. 60 tablet 3  . atorvastatin (LIPITOR) 40 MG tablet Take 1 tablet (40 mg total) by mouth daily. 30 tablet 0  . nicotine (NICODERM CQ - DOSED IN MG/24 HOURS) 21 mg/24hr patch Place 1 patch (21 mg total) onto the skin daily. 14 patch 0  . oxycodone-acetaminophen (PERCOCET) 2.5-325 MG tablet Take 1 tablet by mouth 2 (two) times daily as needed for pain. 50 tablet 0  . traMADol (ULTRAM) 50 MG tablet Take 1 tablet (50 mg total) by mouth every 8 (eight) hours. 90 tablet 2  . enoxaparin (LOVENOX) 150 MG/ML injection Inject 0.8 mLs (120 mg total) into the skin every 12 (twelve) hours. 3.2 mL 0   No current facility-administered medications for this visit.     Review of Systems: Review of Systems  All other systems reviewed and are negative.    PHYSICAL EXAMINATION Blood pressure (!) 149/100, pulse 84, temperature 98.4 F (36.9 C), temperature source Oral, resp. rate 18, height 6\' 3"  (1.905 m), weight 258 lb 11.2 oz (117.3 kg), SpO2 97 %.  ECOG PERFORMANCE STATUS: 0 - Asymptomatic  Physical Exam  Constitutional: He is oriented to person, place, and time and well-developed, well-nourished, and in no distress. No distress.  HENT:  Head: Normocephalic and atraumatic.  Mouth/Throat: Oropharynx is clear and moist. No oropharyngeal exudate.  Eyes: Conjunctivae and EOM are normal. No scleral icterus.  Neck: Normal range of motion. No thyromegaly  present.  Cardiovascular: Normal rate, regular rhythm and normal heart sounds.   No murmur heard. Pulmonary/Chest: Effort normal and breath sounds normal. No respiratory distress. He has no wheezes. He has no rales.  Abdominal: Soft. Bowel sounds are normal. He exhibits no distension and no mass. There is no tenderness. There is no rebound.  Musculoskeletal: Normal range of motion. He exhibits no tenderness.  Mild swelling in the right lower extremity without pitting edema. No palpable vascular cords  Lymphadenopathy:       Head (right side): No submandibular and no occipital adenopathy present.       Head (left side): No submandibular and no occipital adenopathy present.    He has no cervical adenopathy.    He has no axillary adenopathy.       Right: No inguinal and no supraclavicular adenopathy present.       Left: No inguinal and no supraclavicular adenopathy present.  Neurological: He is alert and oriented to person, place, and time. He has normal reflexes. No cranial nerve deficit.  Skin: Skin is warm and dry.  No rash noted. He is not diaphoretic. No erythema. No pallor.     LABORATORY DATA: I have personally reviewed the data as listed: No visits with results within 1 Week(s) from this visit.  Latest known visit with results is:  Orders Only on 01/05/2017  Component Date Value Ref Range Status  . Cologuard 12/28/2016 Negative   Final       Ardath Sax, MD

## 2017-01-22 NOTE — Telephone Encounter (Signed)
Per previous note by Kerin Ransom, patient does not need cardiac clearance. He does however need lovenox bridging.   He is scheduled to see coumadin clinic on 01/26/2017 to discuss lovenox bridging.

## 2017-01-25 NOTE — Telephone Encounter (Signed)
Patient has appointment with Coumadin clinic 01/26/17 to discuss Lovenox bridging. This will be removed from the preop pool.  It will be sent to [facility]. Supple, PharmD for her information. Richardson Dopp, PA-C    01/25/2017 4:38 PM

## 2017-01-26 ENCOUNTER — Ambulatory Visit (INDEPENDENT_AMBULATORY_CARE_PROVIDER_SITE_OTHER): Payer: 59 | Admitting: Pharmacist

## 2017-01-26 VITALS — Wt 254.0 lb

## 2017-01-26 DIAGNOSIS — Z7901 Long term (current) use of anticoagulants: Secondary | ICD-10-CM | POA: Diagnosis not present

## 2017-01-26 NOTE — Progress Notes (Signed)
Pt presents to clinic today for Lovenox bridging due to hx of recurrent VTE even on oral anticoagulation. For upcoming TKA, will need to hold Eliquis for 3 days prior. Renal function is normal, wt 115kg. Will dose 1mg /kg BID = Lovenox 120mg  BID. Procedure date is not set yet. Pt states he has 4 Lovenox syringes at home that Dr Lebron Conners prescribed for him a month ago. He will require 5 Lovenox injections. His dose was also sent in using the 150mg  syringe rather than the 120mg  syringe so patient will have to inject 0.73mL out of each syringe. Dicsussed Lovenox injection technique with pt since he has not used Lovenox before. He will mark in sharpie at the 0.51mL mark on each of his syringes to ensure that he receives 0.63mL of Lovenox with each injection. Instructions are as follows:  Day -4: Last day of Eliquis in the evening before procedure. Day -3: Start Lovenox 120mg  injections subcutaneously at 8am and 8pm Day - 2: Inject Lovenox 120mg  subcutaneously at 8am and 8pm Day - 1: Inject Lovenox 120mg  subcutaneously at 8am, do NOT inject evening dose. Procedure date: No Eliquis, no Lovenox. Day + 1: Resume Eliquis as directed by MD  Advised pt to call clinic with any concerns or if he would like to review the instructions again once he has a procedure date set.

## 2017-01-26 NOTE — Patient Instructions (Addendum)
Day - 4: Last day of Eliquis in the evening before procedure.  Day - 3: Start Lovenox 120mg  injections subcutaneously at 8am and 8pm  Day - 2: Inject Lovenox 120mg  subcutaneously at 8am and 8pm  Day - 1: Inject Lovenox 120mg  subcutaneously at 8am, do NOT inject evening dose.  Procedure date: No Eliquis, no Lovenox.  Day + 1: Resume Eliquis as directed by MD

## 2017-01-28 ENCOUNTER — Other Ambulatory Visit (INDEPENDENT_AMBULATORY_CARE_PROVIDER_SITE_OTHER): Payer: Self-pay | Admitting: Orthopaedic Surgery

## 2017-01-28 DIAGNOSIS — M1712 Unilateral primary osteoarthritis, left knee: Secondary | ICD-10-CM

## 2017-01-29 ENCOUNTER — Other Ambulatory Visit: Payer: Self-pay | Admitting: Internal Medicine

## 2017-01-29 MED FILL — ELIQUIS 5 MG TABLET: 5 | 30 days supply | Qty: 60 | Fill #1

## 2017-02-02 MED FILL — ATORVASTATIN 40 MG TABLET: 40 | 30 days supply | Qty: 30 | Fill #0

## 2017-02-02 NOTE — Pre-Procedure Instructions (Signed)
COUGAR IMEL  02/02/2017      [place], [place] [address] [place] [address] Phone: [phone] Fax: [phone]    Your procedure is scheduled on Wednesday, 02/10/2017.  Report to [facility] Admitting at 0930 A.M.  Call this number if you have problems the morning of surgery:  [phone]   Remember:  Do not eat food or drink liquids after midnight.  Continue all other medications as directed by your physician except follow these medication instructions before surgery   Take these medicines the morning of surgery with A SIP OF WATER: Albuterol inhaler - if needed Amlodipine (Norvasc) Oxycodone-acetaminophen (Percocet) - if needed Tramadol (Ultram) - if needed  7 days prior to surgery STOP taking any Aspirin (unless otherwise instructed by your surgeon), Aleve, Naproxen, Ibuprofen, Motrin, Advil, Goody's, BC's, all herbal medications, fish oil, and all vitamins  Follow your doctors instructions regarding your Eliquis.      Do not wear jewelry.  Do not wear lotions, powders, or colognes, or deodorant.  Men may shave face and neck.  Do not bring valuables to the hospital.  [facility] is not responsible for any belongings or valuables.  Contacts, eyeglasses, dentures or bridgework may not be worn into surgery.  Leave your suitcase in the car.  After surgery it may be brought to your room.  For patients admitted to the hospital, discharge time will be determined by your treatment team.  Patients discharged the day of surgery will not be allowed to drive home.   Name and phone number of your driver:    Special instructions:   [facility]- Preparing For Surgery  Before surgery, you can play an important role. Because skin is not sterile, your skin needs to be as free of germs as possible. You can reduce the number of germs on your skin by washing with CHG (chlorahexidine gluconate)  Soap before surgery.  CHG is an antiseptic cleaner which kills germs and bonds with the skin to continue killing germs even after washing.  Please do not use if you have an allergy to CHG or antibacterial soaps. If your skin becomes reddened/irritated stop using the CHG.  Do not shave (including legs and underarms) for at least 48 hours prior to first CHG shower. It is OK to shave your face.  Please follow these instructions carefully.   1. Shower the NIGHT BEFORE SURGERY and the MORNING OF SURGERY with CHG.   2. If you chose to wash your hair, wash your hair first as usual with your normal shampoo.  3. After you shampoo, rinse your hair and body thoroughly to remove the shampoo.  4. Use CHG as you would any other liquid soap. You can apply CHG directly to the skin and wash gently with a scrungie or a clean washcloth.   5. Apply the CHG Soap to your body ONLY FROM THE NECK DOWN.  Do not use on open wounds or open sores. Avoid contact with your eyes, ears, mouth and genitals (private parts). Wash Face and genitals (private parts)  with your normal soap.  6. Wash thoroughly, paying special attention to the area where your surgery will be performed.  7. Thoroughly rinse your body with warm water from the neck down.  8. DO NOT shower/wash with your normal soap after using and rinsing off the CHG Soap.  9. Pat yourself dry with a CLEAN TOWEL.  10. Wear  CLEAN PAJAMAS to bed the night before surgery, wear comfortable clothes the morning of surgery  11. Place CLEAN SHEETS on your bed the night of your first shower and DO NOT SLEEP WITH PETS.    Day of Surgery: Shower as stated above. Do not apply any deodorants/lotions.  Please wear clean clothes to the hospital/surgery center.      Please read over the following fact sheets that you were given. Coughing and Deep Breathing, Total Joint Packet, MRSA Information and Surgical Site Infection Prevention

## 2017-02-03 ENCOUNTER — Encounter (HOSPITAL_COMMUNITY): Payer: Self-pay

## 2017-02-03 ENCOUNTER — Encounter (HOSPITAL_COMMUNITY)
Admission: RE | Admit: 2017-02-03 | Discharge: 2017-02-03 | Disposition: A | Discharge status: Home / Self Care | Payer: 59 | Source: Ambulatory Visit | Attending: Orthopaedic Surgery | Admitting: Orthopaedic Surgery

## 2017-02-03 ENCOUNTER — Other Ambulatory Visit: Payer: Self-pay

## 2017-02-03 DIAGNOSIS — Z01812 Encounter for preprocedural laboratory examination: Secondary | ICD-10-CM | POA: Insufficient documentation

## 2017-02-03 DIAGNOSIS — M1711 Unilateral primary osteoarthritis, right knee: Secondary | ICD-10-CM | POA: Diagnosis not present

## 2017-02-03 HISTORY — DX: Unspecified osteoarthritis, unspecified site: M19.90

## 2017-02-03 HISTORY — DX: Pure hypercholesterolemia, unspecified: E78.00

## 2017-02-03 LAB — URINALYSIS, ROUTINE W REFLEX MICROSCOPIC
BILIRUBIN URINE: NEGATIVE
Glucose, UA: NEGATIVE mg/dL
Hgb urine dipstick: NEGATIVE
KETONES UR: NEGATIVE mg/dL
Nitrite: NEGATIVE
Protein, ur: NEGATIVE mg/dL
SPECIFIC GRAVITY, URINE: 1.014 (ref 1.005–1.030)
pH: 5 (ref 5.0–8.0)

## 2017-02-03 LAB — CBC WITH DIFFERENTIAL/PLATELET
Basophils Absolute: 0 10*3/uL (ref 0.0–0.1)
Basophils Relative: 0 %
EOS ABS: 0.3 10*3/uL (ref 0.0–0.7)
EOS PCT: 4 %
HCT: 46 % (ref 39.0–52.0)
Hemoglobin: 15.5 g/dL (ref 13.0–17.0)
Lymphocytes Relative: 34 %
Lymphs Abs: 2.3 10*3/uL (ref 0.7–4.0)
MCH: 29.9 pg (ref 26.0–34.0)
MCHC: 33.7 g/dL (ref 30.0–36.0)
MCV: 88.8 fL (ref 78.0–100.0)
MONO ABS: 0.4 10*3/uL (ref 0.1–1.0)
MONOS PCT: 6 %
NEUTROS ABS: 3.7 10*3/uL (ref 1.7–7.7)
NEUTROS PCT: 56 %
PLATELETS: 165 10*3/uL (ref 150–400)
RBC: 5.18 MIL/uL (ref 4.22–5.81)
RDW: 14.1 % (ref 11.5–15.5)
WBC: 6.7 10*3/uL (ref 4.0–10.5)

## 2017-02-03 LAB — COMPREHENSIVE METABOLIC PANEL
ALT: 21 U/L (ref 17–63)
ANION GAP: 9 (ref 5–15)
AST: 24 U/L (ref 15–41)
Albumin: 3.4 g/dL — ABNORMAL LOW (ref 3.5–5.0)
Alkaline Phosphatase: 76 U/L (ref 38–126)
BUN: 7 mg/dL (ref 6–20)
CHLORIDE: 107 mmol/L (ref 101–111)
CO2: 20 mmol/L — ABNORMAL LOW (ref 22–32)
CREATININE: 1.07 mg/dL (ref 0.61–1.24)
Calcium: 8.6 mg/dL — ABNORMAL LOW (ref 8.9–10.3)
Glucose, Bld: 124 mg/dL — ABNORMAL HIGH (ref 65–99)
Potassium: 4 mmol/L (ref 3.5–5.1)
Sodium: 136 mmol/L (ref 135–145)
Total Bilirubin: 0.4 mg/dL (ref 0.3–1.2)
Total Protein: 7.2 g/dL (ref 6.5–8.1)

## 2017-02-03 LAB — ABO/RH: ABO/RH(D): B POS

## 2017-02-03 LAB — APTT: APTT: 30 s (ref 24–36)

## 2017-02-03 LAB — TYPE AND SCREEN
ABO/RH(D): B POS
Antibody Screen: NEGATIVE

## 2017-02-03 LAB — PROTIME-INR
INR: 1.08
PROTHROMBIN TIME: 14 s (ref 11.4–15.2)

## 2017-02-03 LAB — SEDIMENTATION RATE: SED RATE: 5 mm/h (ref 0–16)

## 2017-02-03 LAB — C-REACTIVE PROTEIN: CRP: 2.1 mg/dL — ABNORMAL HIGH (ref <1.0)

## 2017-02-03 LAB — SURGICAL PCR SCREEN
MRSA, PCR: NEGATIVE
Staphylococcus aureus: NEGATIVE

## 2017-02-03 NOTE — Progress Notes (Addendum)
PCP - Dr. Gaspar Garbe Cardiologist - Dr. Radford Pax Pulmonologist - Dr. Milinda Hirschfeld  Chest x-ray - n/a EKG - 06/04/16 Stress Test - patient was supposed to have one done in May but did not have it done  ECHO - 09/22/16 Cardiac Cath - patient denies  Sleep Study - patient denies   Patient denies shortness of breath, fever, cough and chest pain at PAT appointment  Patient's HR was 110's during VS check at beginning of PAT appointment.  Rechecked at the end of appointment and it was still elevated in 110's.   Patient verbalized understanding of instructions that were given to them at the PAT appointment. Patient was also instructed that they will need to review over the PAT instructions again at home before surgery.

## 2017-02-04 NOTE — Progress Notes (Addendum)
Anesthesia chart review: Patient is a 51 year old male scheduled for left TKA on 02/10/17 by Dr. Frankey Shown.  History includes smoking, RLE DVT 11/28/13 (chronic DVT by Duplex 11/2016), bilateral submassive PE with cor pulmonale 06/03/16 (in the setting of missed Xarelto doses for DVT), hypercholesterolemia, arthritis, tonsillectomy, umbilical hernia repair. History of cocaine use, but not since 2004. He does use marijuana.  - PCP is listed as Dr. Kerrin Mo ([place]). Last visit 01/04/17. She is aware of surgery plans.  - Cardiologist is Dr. Fransico Him. Back in May, she had recommended waiting six months out from his 05/2016 submassive PE prior to having knee surgery. She had also recommended a coronary CT which showed a high calcium score with non-obstructive CAD. Aggressive risk factor modification recommended. She also referred patient to GI due to hepatic steatosis. On 01/15/17, Kerin Ransom, PA-C reviewed in anticipation for surgery. Based on Dr. Theodosia Blender notes and recent coronary CT results, it was not felt that patient needed cardiac clearance since he was now > 6 months out from PE. Patient would, however, need a Lovenox bridge and pulmonary clearance. Patient was told to hold Eliquis for 3 days prior to surgery and use a Lovenox bridge due to recurrent VTE on oral anticoagulation. (See 01/26/17 Progress note by Fuller Canada, [place] is Dr. Blanchard Mane, last visit 01/06/17 for preoperative evaluation. He wrote, "Patient is at a moderate risk of perioperative pulmonary complications given his chronic right lower extremity DVT and need for systemic anticoagulation. This should not preclude his ability to undergo the proposed surgery with appropriate precautions and planning as outlined by hematology." He encouraged smoking cessation.   - Hematologist is Dr. Grace Isaac, last visit 01/12/17. Hypercoagulable panel that is negative for congenital mutations  in the factor five or prothrombin gene. Testing is negative for evidence or report immune from birth file based on negative ANA, RF, DRVVT, anti-cardiolipin, and anti-beta-2 glycoprotein antibodies. Patient has normal levels of protein S and antithrombin. Protein C demonstrates decreased function with low-normal antigen level as either reaction to persistent thrombosis or as a possible protein C deficiency. Indefinite anticoagulation with Eliquis recommended with bridigng for any procedure   Meds include albuterol, amlodipine, Eliquis, Lipitor, Lovenox (once Eliquis on hold), NicoDerm CQ, Percocet, tramadol.  BP (!) 137/92   Pulse (!) 116 Comment: notified Probation officer  Temp 37 C   Resp 20   Ht 6\' 3"  (1.905 m)   Wt 256 lb 4.8 oz (116.3 kg)   SpO2 96%   BMI 32.04 kg/m  Anesthesia APP was not notified of tachycardia while patient at PAT. HR 84 on 01/12/17 and 01/06/17. I called patient and he denied chest pain, SOB, palpitations, or significant pain. He felt he was just overwhelmed--having a "bad day" that day and was trying to get his house in order prior to surgery.  Last EKG was on 06/03/16 (at the time of his submassive PE). According to Dr. Theodosia Blender 08/05/16 note, repeat EKG planned to re-evaluate deep anterior T wave inversions felt likely related to acute RV strain in the setting of acute PE. I don't see that he has had a repeat EKG. Also with tachycardia on EKG, I would like to see if he will come back in for a repeat tracing prior to the day of surgery.   Coronary CT 10/13/16: Aorta:  Normal size.  No calcifications.  No dissection. Aortic Valve:  Trileaflet.  No calcifications. Coronary Arteries:  Normal coronary  origin.  Right dominance. - RCA is a large dominant artery that gives rise to PDA and PLVB. There is minimal diffuse calcified plaque in the proximal and mid RCA associated with 0-25% stenosis. - Left main is a large caliber short artery that gives rise to LAD and LCX arteries.  There is no plaque. - LAD is a large vessel that gives rise to two diagonal branches. There is minimal diffuse calcified plaque in the proximal segment associated with 0-25% stenosis. - Diagonal branches have no significant plaque. - LCX is a medium caliber non-dominant artery that gives rise to three OM branches. There is mild mixed, predominantly calcified plaque in the proximal segment associated with 25-50% stenosis. - OM1 is large caliber vessel and has no plaque. - OM2 is very small. - OM3 is medium size and has no plaque. Other findings: Normal pulmonary vein drainage into the left atrium. Normal let atrial appendage without a thrombus. Dilated pulmonary artery measuring 35 mm consistent with pulmonary hypertension. IMPRESSION: 1. High coronary calcium score of 392. This was 36 percentile for age and sex matched control. 2. Normal coronary origin with right dominance. 3. Mild non-obstructive CAD. Aggressive risk factor modification is recommended. 4. Dilated pulmonary artery measuring 35 mm consistent with pulmonary hypertension.  Echo 09/22/16: Study Conclusions - Left ventricle: The cavity size was normal. Wall thickness was   increased in a pattern of moderate LVH. Systolic function was   normal. The estimated ejection fraction was in the range of 60%   to 65%. Wall motion was normal; there were no regional wall   motion abnormalities. Left ventricular diastolic function   parameters were normal. - Left atrium: The atrium was mildly dilated. - Atrial septum: No defect or patent foramen ovale was identified.  CT Chest 12/01/16: IMPRESSION: 1. Minimal motion degradation. 2. Resolution of left upper lobe airspace disease since 06/03/2016. This was likely pulmonary infarct, given widespread pulmonary emboli on that exam. 3. Age advanced coronary artery atherosclerosis. Recommend assessment of coronary risk factors and consideration of medical therapy. 4.  Aortic  Atherosclerosis (ICD10-I70.0). 5. Hepatic steatosis. 6.  Emphysema (ICD10-J43.9).  RLE venous U/S 12/01/16: Summary: - Findings consistent with chronic deep vein thrombosis involving the posterior tibial, popliteal,femoral, and common femoral veins of the right lower extremity. - Findings consistent with chronic superficial thrombosis of the greater saphenous vein and saphenofemoral junction. - No evidence of Baker&'s cyst on the right. - No significant change from study of 2017.  PFT 12/22/16: FVC 3.90 L (87%) FEV1 3.03 L (83%) FEV1/FVC 0.76 FEF 25-75 2.35 L (67%) negative bronchodilator response TLC 6.36 L (84%) RV 99% ERV 166% DLCO corrected 71%  6MWT 01/06/17:  Walked 369 meters / Baseline Sat 100% on RA / Nadir Sat 97% on RA  Preoperative labs noted. Glucose 124. CBC WNL. Cr 1.07. UA showed large leukocytes, negative nitrites, 6-30 WBCs, rare bacteria. UA results called to [place] at Dr. Phoebe Sharps office. Patient denied dysuria, fever, abdominal pain.  I called patient and he is willing to come back in to PAT for an updated EKG. We can re-evaluate for tachycardia at that time.  [facility] Short Stay Center/Anesthesiology Phone [phone] 02/04/2017 5:38 PM  Addendum: Patient came in for EKG 02/08/17 that showed NSR with sinus arrhythmia. Probably non-specific T wave abnormality. Negative T wave V1 and to lesser extent V2--deep T wave inversions in V1-3 have improved overall. Exam today shows heart RRR, no murmur noted. Lungs clear. No ankle pitting edema.  He does have a small area of ecchymosis right lower abdomen (wear he received Lovenox injection). Area is soft, no erythema or drainage. He denied known anesthesia complications. He denied any cocaine use in years. He does smoke tobacco and marijuana, but started nicotine patch today. He says his last Lovenox dose is scheduled for 02/09/17. Tachycardia improved since 02/03/17--unclear etiology although patient  reported a lot of stress that day and had had caffeine. Based on currently available information, I anticipate that he can proceed as planned if no acute changes.  [facility] Short Stay Center/Anesthesiology Phone [phone] 02/08/2017 10:36 AM

## 2017-02-04 NOTE — Progress Notes (Signed)
Error

## 2017-02-08 ENCOUNTER — Other Ambulatory Visit: Payer: Self-pay

## 2017-02-08 ENCOUNTER — Encounter (HOSPITAL_COMMUNITY)
Admission: RE | Admit: 2017-02-08 | Discharge: 2017-02-08 | Disposition: A | Discharge status: Home / Self Care | Payer: 59 | Source: Ambulatory Visit | Attending: Orthopaedic Surgery | Admitting: Orthopaedic Surgery

## 2017-02-08 ENCOUNTER — Encounter (HOSPITAL_COMMUNITY): Payer: Self-pay

## 2017-02-08 DIAGNOSIS — D62 Acute posthemorrhagic anemia: Secondary | ICD-10-CM | POA: Diagnosis not present

## 2017-02-08 DIAGNOSIS — I10 Essential (primary) hypertension: Secondary | ICD-10-CM | POA: Diagnosis not present

## 2017-02-08 DIAGNOSIS — F17210 Nicotine dependence, cigarettes, uncomplicated: Secondary | ICD-10-CM | POA: Diagnosis not present

## 2017-02-08 DIAGNOSIS — Z79899 Other long term (current) drug therapy: Secondary | ICD-10-CM | POA: Diagnosis not present

## 2017-02-08 DIAGNOSIS — F1290 Cannabis use, unspecified, uncomplicated: Secondary | ICD-10-CM | POA: Diagnosis not present

## 2017-02-08 DIAGNOSIS — Z7901 Long term (current) use of anticoagulants: Secondary | ICD-10-CM | POA: Diagnosis not present

## 2017-02-08 DIAGNOSIS — I82401 Acute embolism and thrombosis of unspecified deep veins of right lower extremity: Secondary | ICD-10-CM | POA: Diagnosis not present

## 2017-02-08 DIAGNOSIS — M1712 Unilateral primary osteoarthritis, left knee: Secondary | ICD-10-CM | POA: Diagnosis not present

## 2017-02-09 MED ORDER — BUPIVACAINE LIPOSOME 1.3 % IJ SUSP
20.0000 mL | INTRAMUSCULAR | Status: AC
  Administered 2017-02-10: 20 mL
  Filled 2017-02-09: qty 20

## 2017-02-09 MED ORDER — DEXTROSE 5 % IV SOLN
3.0000 g | INTRAVENOUS | Status: AC
  Administered 2017-02-10: 3 g via INTRAVENOUS
  Filled 2017-02-09: qty 3

## 2017-02-10 ENCOUNTER — Other Ambulatory Visit: Payer: Self-pay

## 2017-02-10 ENCOUNTER — Encounter (HOSPITAL_COMMUNITY)
Admission: RE | Disposition: A | Discharge status: Home Health | Payer: Self-pay | Source: Ambulatory Visit | Attending: Orthopaedic Surgery

## 2017-02-10 ENCOUNTER — Encounter (HOSPITAL_COMMUNITY): Payer: Self-pay | Admitting: Certified Registered Nurse Anesthetist

## 2017-02-10 ENCOUNTER — Inpatient Hospital Stay (HOSPITAL_COMMUNITY)
Admission: RE | Admit: 2017-02-10 | Discharge: 2017-02-12 | DRG: 470 | Disposition: A | Discharge status: Home Health | Payer: 59 | Source: Ambulatory Visit | Attending: Orthopaedic Surgery | Admitting: Orthopaedic Surgery

## 2017-02-10 ENCOUNTER — Inpatient Hospital Stay (HOSPITAL_COMMUNITY): Payer: 59 | Admitting: Emergency Medicine

## 2017-02-10 ENCOUNTER — Inpatient Hospital Stay (HOSPITAL_COMMUNITY): Payer: 59 | Admitting: Vascular Surgery

## 2017-02-10 ENCOUNTER — Inpatient Hospital Stay (HOSPITAL_COMMUNITY): Payer: 59

## 2017-02-10 DIAGNOSIS — Z96659 Presence of unspecified artificial knee joint: Secondary | ICD-10-CM | POA: Diagnosis not present

## 2017-02-10 DIAGNOSIS — Z96652 Presence of left artificial knee joint: Secondary | ICD-10-CM

## 2017-02-10 DIAGNOSIS — Z96653 Presence of artificial knee joint, bilateral: Secondary | ICD-10-CM

## 2017-02-10 DIAGNOSIS — G8918 Other acute postprocedural pain: Secondary | ICD-10-CM | POA: Diagnosis not present

## 2017-02-10 DIAGNOSIS — Z79899 Other long term (current) drug therapy: Secondary | ICD-10-CM

## 2017-02-10 DIAGNOSIS — F17210 Nicotine dependence, cigarettes, uncomplicated: Secondary | ICD-10-CM | POA: Diagnosis present

## 2017-02-10 DIAGNOSIS — F1290 Cannabis use, unspecified, uncomplicated: Secondary | ICD-10-CM | POA: Diagnosis present

## 2017-02-10 DIAGNOSIS — M1712 Unilateral primary osteoarthritis, left knee: Secondary | ICD-10-CM | POA: Diagnosis not present

## 2017-02-10 DIAGNOSIS — Z7901 Long term (current) use of anticoagulants: Secondary | ICD-10-CM | POA: Diagnosis not present

## 2017-02-10 DIAGNOSIS — M25562 Pain in left knee: Secondary | ICD-10-CM | POA: Diagnosis present

## 2017-02-10 DIAGNOSIS — I82401 Acute embolism and thrombosis of unspecified deep veins of right lower extremity: Secondary | ICD-10-CM | POA: Diagnosis present

## 2017-02-10 DIAGNOSIS — D62 Acute posthemorrhagic anemia: Secondary | ICD-10-CM | POA: Diagnosis not present

## 2017-02-10 DIAGNOSIS — Z471 Aftercare following joint replacement surgery: Secondary | ICD-10-CM | POA: Diagnosis not present

## 2017-02-10 DIAGNOSIS — I10 Essential (primary) hypertension: Secondary | ICD-10-CM | POA: Diagnosis present

## 2017-02-10 DIAGNOSIS — M1711 Unilateral primary osteoarthritis, right knee: Secondary | ICD-10-CM | POA: Diagnosis not present

## 2017-02-10 DIAGNOSIS — I739 Peripheral vascular disease, unspecified: Secondary | ICD-10-CM | POA: Diagnosis not present

## 2017-02-10 HISTORY — PX: TOTAL KNEE ARTHROPLASTY: SHX125

## 2017-02-10 SURGERY — ARTHROPLASTY, KNEE, TOTAL
Anesthesia: Monitor Anesthesia Care | Site: Knee | Laterality: Left

## 2017-02-10 MED ORDER — SODIUM CHLORIDE 0.9 % IJ SOLN
INTRAMUSCULAR | Status: DC | PRN
  Administered 2017-02-10: 10 mL

## 2017-02-10 MED ORDER — TRANEXAMIC ACID 1000 MG/10ML IV SOLN
INTRAVENOUS | Status: AC | PRN
  Administered 2017-02-10: 2000 mg via TOPICAL

## 2017-02-10 MED ORDER — SODIUM CHLORIDE 0.9 % IR SOLN
Status: DC | PRN
  Administered 2017-02-10: 3000 mL

## 2017-02-10 MED ORDER — SODIUM CHLORIDE 0.9 % IV SOLN
2000.0000 mg | Freq: Once | INTRAVENOUS | Status: DC
  Filled 2017-02-10: qty 20

## 2017-02-10 MED ORDER — PROPOFOL 1000 MG/100ML IV EMUL
INTRAVENOUS | Status: AC
  Filled 2017-02-10: qty 100

## 2017-02-10 MED ORDER — FENTANYL CITRATE (PF) 100 MCG/2ML IJ SOLN
INTRAMUSCULAR | Status: AC
  Administered 2017-02-10: 100 ug via INTRAVENOUS
  Filled 2017-02-10: qty 2

## 2017-02-10 MED ORDER — LACTATED RINGERS IV SOLN: INTRAVENOUS | Status: DC

## 2017-02-10 MED ORDER — MIDAZOLAM HCL 5 MG/5ML IJ SOLN
INTRAMUSCULAR | Status: DC | PRN
  Administered 2017-02-10: 2 mg via INTRAVENOUS

## 2017-02-10 MED ORDER — MIDAZOLAM HCL 2 MG/2ML IJ SOLN
INTRAMUSCULAR | Status: AC
  Administered 2017-02-10: 2 mg via INTRAVENOUS
  Filled 2017-02-10: qty 2

## 2017-02-10 MED ORDER — METHOCARBAMOL 1000 MG/10ML IJ SOLN: 500.0000 mg | Freq: Four times a day (QID) | INTRAMUSCULAR | Status: DC | PRN

## 2017-02-10 MED ORDER — ONDANSETRON HCL 4 MG/2ML IJ SOLN
INTRAMUSCULAR | Status: AC
  Filled 2017-02-10: qty 2

## 2017-02-10 MED ORDER — PROPOFOL 10 MG/ML IV BOLUS
INTRAVENOUS | Status: DC | PRN
  Administered 2017-02-10: 20 mg via INTRAVENOUS
  Administered 2017-02-10: 10 mg via INTRAVENOUS

## 2017-02-10 MED ORDER — ACETAMINOPHEN 500 MG PO TABS
1000.0000 mg | ORAL_TABLET | Freq: Four times a day (QID) | ORAL | Status: AC
  Administered 2017-02-10 – 2017-02-11 (×4): 1000 mg via ORAL
  Filled 2017-02-10 (×4): qty 2

## 2017-02-10 MED ORDER — PROPOFOL 500 MG/50ML IV EMUL
INTRAVENOUS | Status: DC | PRN
  Administered 2017-02-10: 50 ug/kg/min via INTRAVENOUS

## 2017-02-10 MED ORDER — ACETAMINOPHEN 325 MG PO TABS: 650.0000 mg | ORAL_TABLET | ORAL | Status: DC | PRN

## 2017-02-10 MED ORDER — SENNOSIDES-DOCUSATE SODIUM 8.6-50 MG PO TABS: 1.0000 | ORAL_TABLET | Freq: Every evening | ORAL | 1 refills | Status: DC | PRN

## 2017-02-10 MED ORDER — FENTANYL CITRATE (PF) 100 MCG/2ML IJ SOLN
100.0000 ug | Freq: Once | INTRAMUSCULAR | Status: AC
  Administered 2017-02-10: 100 ug via INTRAVENOUS

## 2017-02-10 MED ORDER — HYDROMORPHONE HCL 1 MG/ML IJ SOLN: 0.2500 mg | INTRAMUSCULAR | Status: DC | PRN

## 2017-02-10 MED ORDER — BUPIVACAINE-EPINEPHRINE (PF) 0.5% -1:200000 IJ SOLN
INTRAMUSCULAR | Status: DC | PRN
  Administered 2017-02-10: 30 mL via PERINEURAL

## 2017-02-10 MED ORDER — MIDAZOLAM HCL 2 MG/2ML IJ SOLN
2.0000 mg | Freq: Once | INTRAMUSCULAR | Status: AC
  Administered 2017-02-10: 2 mg via INTRAVENOUS

## 2017-02-10 MED ORDER — ONDANSETRON HCL 4 MG/2ML IJ SOLN: 4.0000 mg | Freq: Four times a day (QID) | INTRAMUSCULAR | Status: DC | PRN

## 2017-02-10 MED ORDER — ENOXAPARIN SODIUM 150 MG/ML ~~LOC~~ SOLN: 120.0000 mg | Freq: Two times a day (BID) | SUBCUTANEOUS | Status: DC

## 2017-02-10 MED ORDER — ALBUTEROL SULFATE (2.5 MG/3ML) 0.083% IN NEBU: 3.0000 mL | INHALATION_SOLUTION | Freq: Four times a day (QID) | RESPIRATORY_TRACT | Status: DC | PRN

## 2017-02-10 MED ORDER — LACTATED RINGERS IV SOLN
INTRAVENOUS | Status: DC | PRN
  Administered 2017-02-10 (×2): via INTRAVENOUS

## 2017-02-10 MED ORDER — ASPIRIN EC 325 MG PO TBEC
325.0000 mg | DELAYED_RELEASE_TABLET | Freq: Two times a day (BID) | ORAL | Status: DC
  Administered 2017-02-11 – 2017-02-12 (×3): 325 mg via ORAL
  Filled 2017-02-10 (×3): qty 1

## 2017-02-10 MED ORDER — ATORVASTATIN CALCIUM 40 MG PO TABS
40.0000 mg | ORAL_TABLET | Freq: Every day | ORAL | Status: DC
  Administered 2017-02-11 – 2017-02-12 (×2): 40 mg via ORAL
  Filled 2017-02-10 (×2): qty 1

## 2017-02-10 MED ORDER — ONDANSETRON HCL 4 MG/2ML IJ SOLN
INTRAMUSCULAR | Status: DC | PRN
  Administered 2017-02-10: 4 mg via INTRAVENOUS

## 2017-02-10 MED ORDER — ALUM & MAG HYDROXIDE-SIMETH 200-200-20 MG/5ML PO SUSP: 30.0000 mL | ORAL | Status: DC | PRN

## 2017-02-10 MED ORDER — DEXAMETHASONE SODIUM PHOSPHATE 10 MG/ML IJ SOLN
10.0000 mg | Freq: Once | INTRAMUSCULAR | Status: AC
  Administered 2017-02-11: 10 mg via INTRAVENOUS
  Filled 2017-02-10: qty 1

## 2017-02-10 MED ORDER — SODIUM CHLORIDE 0.9 % IV SOLN
INTRAVENOUS | Status: DC
  Administered 2017-02-10 – 2017-02-11 (×2): via INTRAVENOUS

## 2017-02-10 MED ORDER — ONDANSETRON HCL 4 MG PO TABS: 4.0000 mg | ORAL_TABLET | Freq: Four times a day (QID) | ORAL | Status: DC | PRN

## 2017-02-10 MED ORDER — OXYCODONE HCL 5 MG PO TABS: 5.0000 mg | ORAL_TABLET | ORAL | 0 refills | Status: DC | PRN

## 2017-02-10 MED ORDER — PROMETHAZINE HCL 25 MG PO TABS: 25.0000 mg | ORAL_TABLET | Freq: Four times a day (QID) | ORAL | 1 refills | Status: DC | PRN

## 2017-02-10 MED ORDER — OXYCODONE HCL 5 MG PO TABS
10.0000 mg | ORAL_TABLET | ORAL | Status: DC | PRN
  Administered 2017-02-10 – 2017-02-12 (×5): 10 mg via ORAL
  Filled 2017-02-10 (×5): qty 2

## 2017-02-10 MED ORDER — METOCLOPRAMIDE HCL 5 MG PO TABS: 5.0000 mg | ORAL_TABLET | Freq: Three times a day (TID) | ORAL | Status: DC | PRN

## 2017-02-10 MED ORDER — ACETAMINOPHEN 650 MG RE SUPP: 650.0000 mg | RECTAL | Status: DC | PRN

## 2017-02-10 MED ORDER — PHENOL 1.4 % MT LIQD: 1.0000 | OROMUCOSAL | Status: DC | PRN

## 2017-02-10 MED ORDER — NICOTINE 21 MG/24HR TD PT24
21.0000 mg | MEDICATED_PATCH | Freq: Every day | TRANSDERMAL | Status: DC
  Administered 2017-02-11 – 2017-02-12 (×2): 21 mg via TRANSDERMAL
  Filled 2017-02-10 (×2): qty 1

## 2017-02-10 MED ORDER — MAGNESIUM CITRATE PO SOLN: 1.0000 | Freq: Once | ORAL | Status: DC | PRN

## 2017-02-10 MED ORDER — AMLODIPINE BESYLATE 10 MG PO TABS
10.0000 mg | ORAL_TABLET | Freq: Every day | ORAL | Status: DC
  Administered 2017-02-11 – 2017-02-12 (×2): 10 mg via ORAL
  Filled 2017-02-10 (×2): qty 1

## 2017-02-10 MED ORDER — MIDAZOLAM HCL 2 MG/2ML IJ SOLN
INTRAMUSCULAR | Status: AC
  Filled 2017-02-10: qty 2

## 2017-02-10 MED ORDER — MORPHINE SULFATE (PF) 4 MG/ML IV SOLN
1.0000 mg | INTRAVENOUS | Status: DC | PRN
  Administered 2017-02-10: 1 mg via INTRAVENOUS
  Filled 2017-02-10: qty 1

## 2017-02-10 MED ORDER — ONDANSETRON HCL 4 MG PO TABS: 4.0000 mg | ORAL_TABLET | Freq: Three times a day (TID) | ORAL | 0 refills | Status: DC | PRN

## 2017-02-10 MED ORDER — OXYCODONE HCL 5 MG PO TABS: 5.0000 mg | ORAL_TABLET | ORAL | Status: DC | PRN

## 2017-02-10 MED ORDER — FENTANYL CITRATE (PF) 100 MCG/2ML IJ SOLN
INTRAMUSCULAR | Status: DC | PRN
  Administered 2017-02-10 (×2): 50 ug via INTRAVENOUS

## 2017-02-10 MED ORDER — DIPHENHYDRAMINE HCL 12.5 MG/5ML PO ELIX: 25.0000 mg | ORAL_SOLUTION | ORAL | Status: DC | PRN

## 2017-02-10 MED ORDER — OXYCODONE HCL ER 10 MG PO T12A: 10.0000 mg | EXTENDED_RELEASE_TABLET | Freq: Two times a day (BID) | ORAL | 0 refills | Status: DC

## 2017-02-10 MED ORDER — TIZANIDINE HCL 4 MG PO TABS: 4.0000 mg | ORAL_TABLET | Freq: Four times a day (QID) | ORAL | 2 refills | Status: DC | PRN

## 2017-02-10 MED ORDER — 0.9 % SODIUM CHLORIDE (POUR BTL) OPTIME
TOPICAL | Status: DC | PRN
  Administered 2017-02-10: 1000 mL

## 2017-02-10 MED ORDER — PROMETHAZINE HCL 25 MG/ML IJ SOLN: 6.2500 mg | INTRAMUSCULAR | Status: DC | PRN

## 2017-02-10 MED ORDER — APIXABAN 5 MG PO TABS
5.0000 mg | ORAL_TABLET | Freq: Two times a day (BID) | ORAL | Status: DC
  Administered 2017-02-11 – 2017-02-12 (×3): 5 mg via ORAL
  Filled 2017-02-10 (×3): qty 1

## 2017-02-10 MED ORDER — SORBITOL 70 % SOLN: 30.0000 mL | Freq: Every day | Status: DC | PRN

## 2017-02-10 MED ORDER — FENTANYL CITRATE (PF) 250 MCG/5ML IJ SOLN
INTRAMUSCULAR | Status: AC
  Filled 2017-02-10: qty 5

## 2017-02-10 MED ORDER — METOCLOPRAMIDE HCL 5 MG/ML IJ SOLN: 5.0000 mg | Freq: Three times a day (TID) | INTRAMUSCULAR | Status: DC | PRN

## 2017-02-10 MED ORDER — METHOCARBAMOL 500 MG PO TABS
500.0000 mg | ORAL_TABLET | Freq: Four times a day (QID) | ORAL | Status: DC | PRN
  Administered 2017-02-10 – 2017-02-12 (×5): 500 mg via ORAL
  Filled 2017-02-10 (×6): qty 1

## 2017-02-10 MED ORDER — OXYCODONE HCL ER 15 MG PO T12A
15.0000 mg | EXTENDED_RELEASE_TABLET | Freq: Two times a day (BID) | ORAL | Status: DC
  Administered 2017-02-10 – 2017-02-12 (×4): 15 mg via ORAL
  Filled 2017-02-10 (×4): qty 1

## 2017-02-10 MED ORDER — CEFAZOLIN SODIUM-DEXTROSE 2-4 GM/100ML-% IV SOLN
2.0000 g | Freq: Four times a day (QID) | INTRAVENOUS | Status: AC
  Administered 2017-02-10 – 2017-02-11 (×3): 2 g via INTRAVENOUS
  Filled 2017-02-10 (×3): qty 100

## 2017-02-10 MED ORDER — KETOROLAC TROMETHAMINE 15 MG/ML IJ SOLN
30.0000 mg | Freq: Four times a day (QID) | INTRAMUSCULAR | Status: AC
  Administered 2017-02-10 – 2017-02-11 (×4): 30 mg via INTRAVENOUS
  Filled 2017-02-10 (×4): qty 2

## 2017-02-10 MED ORDER — POLYETHYLENE GLYCOL 3350 17 G PO PACK: 17.0000 g | PACK | Freq: Every day | ORAL | Status: DC | PRN

## 2017-02-10 MED ORDER — MENTHOL 3 MG MT LOZG: 1.0000 | LOZENGE | OROMUCOSAL | Status: DC | PRN

## 2017-02-10 MED FILL — tiZANidine HCL 4 MG TABS: 4 | 7 days supply | Qty: 30 | Fill #0

## 2017-02-10 SURGICAL SUPPLY — 63 items
ALCOHOL ISOPROPYL (RUBBING) (MISCELLANEOUS) ×3 IMPLANT
BAG DECANTER FOR FLEXI CONT (MISCELLANEOUS) ×3 IMPLANT
BANDAGE ACE 6X5 VEL STRL LF (GAUZE/BANDAGES/DRESSINGS) ×6 IMPLANT
BANDAGE ELASTIC 6 VELCRO ST LF (GAUZE/BANDAGES/DRESSINGS) ×3 IMPLANT
BANDAGE ESMARK 6X9 LF (GAUZE/BANDAGES/DRESSINGS) ×1 IMPLANT
BENZOIN TINCTURE PRP APPL 2/3 (GAUZE/BANDAGES/DRESSINGS) ×3 IMPLANT
BLADE SAW SGTL 13.0X1.19X90.0M (BLADE) ×3 IMPLANT
BNDG COHESIVE 3X5 TAN STRL LF (GAUZE/BANDAGES/DRESSINGS) ×3 IMPLANT
BNDG ESMARK 6X9 LF (GAUZE/BANDAGES/DRESSINGS) ×3
BOWL SMART MIX CTS (DISPOSABLE) IMPLANT
CAPT KNEE TRIATH TK-4 ×3 IMPLANT
CLOSURE STERI-STRIP 1/2X4 (GAUZE/BANDAGES/DRESSINGS) ×1
CLSR STERI-STRIP ANTIMIC 1/2X4 (GAUZE/BANDAGES/DRESSINGS) ×2 IMPLANT
COVER SURGICAL LIGHT HANDLE (MISCELLANEOUS) ×3 IMPLANT
CUFF TOURNIQUET SINGLE 34IN LL (TOURNIQUET CUFF) ×3 IMPLANT
CUFF TOURNIQUET SINGLE 44IN (TOURNIQUET CUFF) IMPLANT
DRAPE EXTREMITY T 121X128X90 (DRAPE) ×3 IMPLANT
DRAPE HALF SHEET 40X57 (DRAPES) ×3 IMPLANT
DRAPE INCISE IOBAN 66X45 STRL (DRAPES) IMPLANT
DRAPE ORTHO SPLIT 77X108 STRL (DRAPES) ×4
DRAPE SURG 17X11 SM STRL (DRAPES) ×6 IMPLANT
DRAPE SURG ORHT 6 SPLT 77X108 (DRAPES) ×2 IMPLANT
DRSG AQUACEL AG ADV 3.5X10 (GAUZE/BANDAGES/DRESSINGS) ×3 IMPLANT
DRSG AQUACEL AG ADV 3.5X14 (GAUZE/BANDAGES/DRESSINGS) ×3 IMPLANT
DURAPREP 26ML APPLICATOR (WOUND CARE) ×9 IMPLANT
ELECT CAUTERY BLADE 6.4 (BLADE) ×3 IMPLANT
ELECT REM PT RETURN 9FT ADLT (ELECTROSURGICAL) ×3
ELECTRODE REM PT RTRN 9FT ADLT (ELECTROSURGICAL) ×1 IMPLANT
GLOVE SKINSENSE NS SZ7.5 (GLOVE) ×2
GLOVE SKINSENSE STRL SZ7.5 (GLOVE) ×1 IMPLANT
GLOVE SURG SYN 7.5  E (GLOVE) ×8
GLOVE SURG SYN 7.5 E (GLOVE) ×4 IMPLANT
GOWN STRL REIN XL XLG (GOWN DISPOSABLE) ×3 IMPLANT
GOWN STRL REUS W/ TWL LRG LVL3 (GOWN DISPOSABLE) ×1 IMPLANT
GOWN STRL REUS W/TWL LRG LVL3 (GOWN DISPOSABLE) ×2
HANDPIECE INTERPULSE COAX TIP (DISPOSABLE) ×2
HOOD PEEL AWAY FLYTE STAYCOOL (MISCELLANEOUS) ×9 IMPLANT
KIT BASIN OR (CUSTOM PROCEDURE TRAY) ×3 IMPLANT
KIT ROOM TURNOVER OR (KITS) ×3 IMPLANT
MANIFOLD NEPTUNE II (INSTRUMENTS) ×3 IMPLANT
MARKER SKIN DUAL TIP RULER LAB (MISCELLANEOUS) ×3 IMPLANT
NEEDLE SPNL 18GX3.5 QUINCKE PK (NEEDLE) ×3 IMPLANT
NS IRRIG 1000ML POUR BTL (IV SOLUTION) ×3 IMPLANT
PACK TOTAL JOINT (CUSTOM PROCEDURE TRAY) ×3 IMPLANT
PAD ARMBOARD 7.5X6 YLW CONV (MISCELLANEOUS) ×6 IMPLANT
SAW OSC TIP CART 19.5X105X1.3 (SAW) ×3 IMPLANT
SET HNDPC FAN SPRY TIP SCT (DISPOSABLE) ×1 IMPLANT
STAPLER VISISTAT 35W (STAPLE) IMPLANT
SUCTION FRAZIER HANDLE 10FR (MISCELLANEOUS) ×2
SUCTION TUBE FRAZIER 10FR DISP (MISCELLANEOUS) ×1 IMPLANT
SUT ETHILON 2 0 FS 18 (SUTURE) IMPLANT
SUT MNCRL AB 4-0 PS2 18 (SUTURE) IMPLANT
SUT VIC AB 0 CT1 27 (SUTURE) ×4
SUT VIC AB 0 CT1 27XBRD ANBCTR (SUTURE) ×2 IMPLANT
SUT VIC AB 1 CTX 27 (SUTURE) ×9 IMPLANT
SUT VIC AB 2-0 CT1 27 (SUTURE) ×6
SUT VIC AB 2-0 CT1 TAPERPNT 27 (SUTURE) ×3 IMPLANT
SYR 50ML LL SCALE MARK (SYRINGE) ×3 IMPLANT
TOWEL OR 17X24 6PK STRL BLUE (TOWEL DISPOSABLE) ×3 IMPLANT
TOWEL OR 17X26 10 PK STRL BLUE (TOWEL DISPOSABLE) ×3 IMPLANT
TRAY CATH 16FR W/PLASTIC CATH (SET/KITS/TRAYS/PACK) IMPLANT
UNDERPAD 30X30 (UNDERPADS AND DIAPERS) ×3 IMPLANT
WRAP KNEE MAXI GEL POST OP (GAUZE/BANDAGES/DRESSINGS) ×3 IMPLANT

## 2017-02-10 NOTE — Progress Notes (Signed)
Orthopedic Tech Progress Note Patient Details:  Jeffrey [name] 1965/08/30 [id number]  CPM Left Knee Left Knee Flexion (Degrees): 90 Left Knee Extension (Degrees): 0   Hildred Priest 02/10/2017, 3:57 PM

## 2017-02-10 NOTE — Transfer of Care (Signed)
Immediate Anesthesia Transfer of Care Note  Patient: DAKAI BRAITHWAITE  Procedure(s) Performed: LEFT TOTAL KNEE ARTHROPLASTY (Left Knee)  Patient Location: PACU  Anesthesia Type:MAC, Regional and Spinal  Level of Consciousness: awake and alert   Airway & Oxygen Therapy: Patient Spontanous Breathing  Post-op Assessment: Report given to RN and Post -op Vital signs reviewed and stable  Post vital signs: Reviewed and stable  Last Vitals:  Vitals:   02/10/17 1105 02/10/17 1435  BP: (!) 152/93 119/80  Pulse: 78 85  Resp: 16 19  Temp:  (!) 36.3 C  SpO2: 99% 93%    Last Pain:  Vitals:   02/10/17 1014  TempSrc:   PainSc: 6       Patients Stated Pain Goal: 3 (38/33/38 3291)  Complications: No apparent anesthesia complications

## 2017-02-10 NOTE — Op Note (Signed)
Total Knee Arthroplasty Procedure Note  Preoperative diagnosis: Left knee osteoarthritis  Postoperative diagnosis:same  Operative procedure: Left total knee arthroplasty. CPT [phone]  Surgeon: N. Eduard Roux, MD  Assistants: April Green, RNFA  Anesthesia: Spinal, regional  Tourniquet time: 60 mins  Implants used: [employer]ker Triatholon Femur: PS 7 Tibia: 7 Patella: 35 mm, 9 thick Polyethylene: 9 mm  Indication: Jeffrey [name] is a 51 y.o. year old male with a history of knee pain. Having failed conservative management, the patient elected to proceed with a total knee arthroplasty.  We have reviewed the risk and benefits of the surgery and they elected to proceed after voicing understanding.  Procedure:  After informed consent was obtained and understanding of the risk were voiced including but not limited to bleeding, infection, damage to surrounding structures including nerves and vessels, blood clots, leg length inequality and the failure to achieve desired results, the operative extremity was marked with verbal confirmation of the patient in the holding area.   The patient was then brought to the operating room and transported to the operating room table in the supine position.  A tourniquet was applied to the operative extremity around the upper thigh. The operative limb was then prepped and draped in the usual sterile fashion and preoperative antibiotics were administered.  A time out was performed prior to the start of surgery confirming the correct extremity, preoperative antibiotic administration, as well as team members, implants and instruments available for the case. Correct surgical site was also confirmed with preoperative radiographs. The limb was then elevated for exsanguination and the tourniquet was inflated. A midline incision was made and a standard medial parapatellar approach was performed.  The patella was prepared and sized to a 35 mm.  A cover was placed on the  patella for protection from retractors.  We then turned our attention to the femur. Posterior cruciate ligament was sacrificed. Start site was drilled in the femur and the intramedullary distal femoral cutting guide was placed, set at 3 degrees valgus, taking 13 mm of distal resection. The distal cut was made. Osteophytes were then removed. Next, the proximal tibial cutting guide was placed with appropriate slope, varus/valgus alignment and depth of resection. The proximal tibial cut was made. Gap blocks were then used to assess the extension gap and alignment, and appropriate soft tissue releases were performed. Attention was turned back to the femur, which was sized using the sizing guide to a size 7. Appropriate rotation of the femoral component was determined using epicondylar axis, Whiteside's line, and assessing the flexion gap under ligament tension. The appropriate size 4-in-1 cutting block was placed and cuts were made. Posterior femoral osteophytes and uncapped bone were then removed with the curved osteotome. The tibia was sized for a size 7 component. The femoral box-cutting guide was placed and prepared for a PS femoral component. Trial components were placed, and stability was checked in full extension, mid-flexion, and deep flexion. Proper tibial rotation was determined and marked.  The patella tracked well without a lateral release. Trial components were then removed and tibial preparation performed. A posterior capsular injection comprising of 20 cc of 1.3% exparel and 40 cc of normal saline was performed for postoperative pain control. The bony surfaces were irrigated with a pulse lavage and then dried. The final components sized above were malleted into place. The stability of the construct was re-evaluated throughout a range of motion and found to be acceptable. The trial liner was removed, the knee was copiously  irrigated, and the knee was re-evaluated for any excess bone debris. The real  polyethylene liner, 9 mm thick, was inserted and checked to ensure the locking mechanism had engaged appropriately. The tourniquet was deflated and hemostasis was achieved. The wound was irrigated with normal saline. A drain was not placed. Capsular closure was performed with a #1 vicryl, subcutaneous fat closed with a 2.0 vicryl suture, then subcutaneous tissue closed with interrupted 2.0 vicryl suture. The skin was then closed with a 3.0 monocryl. A sterile dressing was applied.   The patient was awakened in the operating room and taken to recovery in stable condition. All sponge, needle, and instrument counts were correct at the end of the case.  Position: supine  Complications: none.  Time Out: performed   Drains/Packing: none  Estimated blood loss: 75 cc  Returned to Recovery Room: in good condition.   Antibiotics: yes   Mechanical VTE (DVT) Prophylaxis: sequential compression devices, TED thigh-high  Chemical VTE (DVT) Prophylaxis: lovenox bridge to eliquis  Fluid Replacement  Crystalloid: see anesthesia record Blood: none  FFP: none   Specimens Removed: 1 to pathology   Sponge and Instrument Count Correct? yes   PACU: portable radiograph - knee AP and Lateral   Admission: inpatient status  Plan/RTC: Return in 2 weeks for wound check.   Weight Bearing/Load Lower Extremity: full   N. Eduard Roux, MD [place] [phone] 2:05 PM

## 2017-02-10 NOTE — Anesthesia Postprocedure Evaluation (Signed)
Anesthesia Post Note  Patient: Jeffrey [name]  Procedure(s) Performed: LEFT TOTAL KNEE ARTHROPLASTY (Left Knee)     Patient location during evaluation: PACU Anesthesia Type: MAC and Spinal Level of consciousness: awake and alert Pain management: pain level controlled Vital Signs Assessment: post-procedure vital signs reviewed and stable Respiratory status: spontaneous breathing and respiratory function stable Cardiovascular status: blood pressure returned to baseline and stable Postop Assessment: spinal receding Anesthetic complications: no    Last Vitals:  Vitals:   02/10/17 1651 02/10/17 2049  BP: (!) 138/104 (!) 158/92  Pulse: 63 98  Resp: 16 16  Temp: 36.6 C 36.6 C  SpO2: 96% 96%    Last Pain:  Vitals:   02/10/17 2049  TempSrc: Oral  PainSc:                  Roselyn Doby DANIEL

## 2017-02-10 NOTE — H&P (Signed)
PREOPERATIVE H&P  Chief Complaint: left knee degenerative joint disease  HPI: Jeffrey [name] is a 51 y.o. male who presents for surgical treatment of left knee degenerative joint disease.  He denies any changes in medical history.  Past Medical History:  Diagnosis Date  . Acute pulmonary embolus (HCC)    bilateral submassive PE in setting of missing several doses of Xarelto for his DVT  . Arthritis   . DVT (deep venous thrombosis) ([place]) 11/28/2013   RT LEG  . High cholesterol   . Hypertension   . Marijuana use    Past Surgical History:  Procedure Laterality Date  . HERNIA REPAIR     6503 and 5465; umbilical hernia repair  . KNEE SURGERY Bilateral    Left knee 1993, right knee 2004  . TONSILLECTOMY     Social History   Socioeconomic History  . Marital status: Married    Spouse name: None  . Number of children: None  . Years of education: None  . Highest education level: None  Social Needs  . Financial resource strain: None  . Food insecurity - worry: None  . Food insecurity - inability: None  . Transportation needs - medical: None  . Transportation needs - non-medical: None  Occupational History  . None  Tobacco Use  . Smoking status: Current Every Day Smoker    Packs/day: 0.25    Years: 35.00    Pack years: 8.75    Types: Cigarettes    Start date: 10/07/1976  . Smokeless tobacco: Never Used  . Tobacco comment: Stopped for up to 5 years total - Peak rate 2ppd  Substance and Sexual Activity  . Alcohol use: Yes    Alcohol/week: 7.2 oz    Types: 12 Cans of beer per week    Comment: seldom wine/liquor  . Drug use: Yes    Types: Marijuana, Cocaine    Comment: Cocaine last used in 2004, current marijuana use  . Sexual activity: Yes    Birth control/protection: None    Comment: With wife  Other Topics Concern  . None  Social History Narrative   Lives in [place].   Chickens as pet.    Hobbies: Radiographer, therapeutic Pulmonary (11/24/16):   Originally from [place]. Has worked in Architect, Ambulance person, Press photographer, & in a warehouse. No known asbestos exposure. Previously had chickens as pets. No mold exposure.    Family History  Problem Relation Age of Onset  . Heart disease Other   . Arthritis Other   . Alcohol abuse Mother   . Heart disease Mother   . Depression Mother   . Hypertension Mother   . Kidney disease Mother   . Arthritis Mother   . Clotting disorder Mother   . Arthritis Father   . Alcohol abuse Brother   . Drug abuse Brother   . Sickle cell anemia Brother   . Arthritis Maternal Grandmother   . Heart disease Maternal Grandmother   . Arthritis Maternal Grandfather   . Heart disease Maternal Grandfather   . Asthma Cousin    No Known Allergies Prior to Admission medications   Medication Sig Start Date End Date Taking? Authorizing Provider  albuterol (PROVENTIL HFA;VENTOLIN HFA) 108 (90 Base) MCG/ACT inhaler Inhale 2 puffs into the lungs every 6 (six) hours as needed for wheezing or shortness of breath. 12/31/16  Yes Mikell, Jeani Sow, MD  amLODipine (NORVASC) 10 MG tablet Take 1 tablet (10 mg total)  by mouth daily. 11/16/16  Yes Mikell, Jeani Sow, MD  apixaban (ELIQUIS) 5 MG TABS tablet Take 1 tablet (5 mg total) by mouth 2 (two) times daily. 11/16/16  Yes Mikell, Jeani Sow, MD  atorvastatin (LIPITOR) 40 MG tablet TAKE 1 TABLET BY MOUTH DAILY. 02/02/17  Yes Mikell, Jeani Sow, MD  nicotine (NICODERM CQ - DOSED IN MG/24 HOURS) 21 mg/24hr patch Place 1 patch (21 mg total) onto the skin daily. 06/08/16  Yes [name], DO  oxycodone-acetaminophen (PERCOCET) 2.5-325 MG tablet Take 1 tablet by mouth 2 (two) times daily as needed for pain. 01/08/17  Yes Mikell, Jeani Sow, MD  traMADol (ULTRAM) 50 MG tablet Take 1 tablet (50 mg total) by mouth every 8 (eight) hours. Patient taking differently: Take 50 mg every 8 (eight) hours as needed by mouth for moderate pain.  10/08/16  Yes Mikell, Jeani Sow, MD  enoxaparin  (LOVENOX) 150 MG/ML injection Inject 0.8 mLs (120 mg total) into the skin every 12 (twelve) hours. 12/23/16 12/25/16  Ardath Sax, MD  ondansetron (ZOFRAN) 4 MG tablet Take 1-2 tablets (4-8 mg total) every 8 (eight) hours as needed by mouth for nausea or vomiting. 02/10/17   Leandrew Koyanagi, MD  oxyCODONE (OXY IR/ROXICODONE) 5 MG immediate release tablet Take 1-3 tablets (5-15 mg total) every 4 (four) hours as needed by mouth. 02/10/17   Leandrew Koyanagi, MD  oxyCODONE (OXYCONTIN) 10 mg 12 hr tablet Take 1 tablet (10 mg total) every 12 (twelve) hours by mouth. 02/10/17   Leandrew Koyanagi, MD  promethazine (PHENERGAN) 25 MG tablet Take 1 tablet (25 mg total) every 6 (six) hours as needed by mouth for nausea. 02/10/17   Leandrew Koyanagi, MD  senna-docusate (SENOKOT S) 8.6-50 MG tablet Take 1 tablet at bedtime as needed by mouth. 02/10/17   Leandrew Koyanagi, MD  tiZANidine (ZANAFLEX) 4 MG tablet Take 1 tablet (4 mg total) every 6 (six) hours as needed by mouth for muscle spasms. 02/10/17   Leandrew Koyanagi, MD     Positive ROS: All other systems have been reviewed and were otherwise negative with the exception of those mentioned in the HPI and as above.  Physical Exam: General: Alert, no acute distress Cardiovascular: No pedal edema Respiratory: No cyanosis, no use of accessory musculature GI: abdomen soft Skin: No lesions in the area of chief complaint Neurologic: Sensation intact distally Psychiatric: Patient is competent for consent with normal mood and affect Lymphatic: no lymphedema  MUSCULOSKELETAL: exam stable  Assessment: left knee degenerative joint disease  Plan: Plan for Procedure(s): LEFT TOTAL KNEE ARTHROPLASTY  The risks benefits and alternatives were discussed with the patient including but not limited to the risks of nonoperative treatment, versus surgical intervention including infection, bleeding, nerve injury,  blood clots, cardiopulmonary complications, morbidity, mortality, among  others, and they were willing to proceed.   Eduard Roux, MD   02/10/2017 8:26 PM

## 2017-02-10 NOTE — Anesthesia Procedure Notes (Signed)
Spinal  Patient location during procedure: OR Staffing Anesthesiologist: Duane Boston, MD Performed: anesthesiologist  Preanesthetic Checklist Completed: patient identified, surgical consent, pre-op evaluation, timeout performed, IV checked, risks and benefits discussed and monitors and equipment checked Spinal Block Patient position: sitting Prep: DuraPrep Patient monitoring: cardiac monitor, continuous pulse ox and blood pressure Approach: midline Injection technique: single-shot Needle Needle type: Pencan  Needle gauge: 24 G Needle length: 9 cm Additional Notes Functioning IV was confirmed and monitors were applied. Sterile prep and drape, including hand hygiene and sterile gloves were used. The patient was positioned and the spine was prepped. The skin was anesthetized with lidocaine.  Free flow of clear CSF was obtained prior to injecting local anesthetic into the CSF.  The spinal needle aspirated freely following injection.  The needle was carefully withdrawn.  The patient tolerated the procedure well.

## 2017-02-10 NOTE — Anesthesia Procedure Notes (Signed)
Procedures

## 2017-02-10 NOTE — Anesthesia Procedure Notes (Signed)
Procedure Name: MAC Date/Time: 02/10/2017 12:15 PM Performed by: Harden Mo, CRNA Pre-anesthesia Checklist: Patient identified, Emergency Drugs available, Suction available and Patient being monitored Patient Re-evaluated:Patient Re-evaluated prior to induction Oxygen Delivery Method: Simple face mask Preoxygenation: Pre-oxygenation with 100% oxygen Induction Type: IV induction Placement Confirmation: positive ETCO2 and breath sounds checked- equal and bilateral Dental Injury: Teeth and Oropharynx as per pre-operative assessment

## 2017-02-10 NOTE — Anesthesia Preprocedure Evaluation (Addendum)
Anesthesia Evaluation  Patient identified by MRN, date of birth, ID band Patient awake    Reviewed: Allergy & Precautions, NPO status , Patient's Chart, lab work & pertinent test results  History of Anesthesia Complications Negative for: history of anesthetic complications  Airway Mallampati: I  TM Distance: >3 FB Neck ROM: Full    Dental  (+) Teeth Intact, Dental Advisory Given   Pulmonary Current Smoker, PE   Pulmonary exam normal        Cardiovascular hypertension, + DVT  Normal cardiovascular exam     Neuro/Psych negative neurological ROS  negative psych ROS   GI/Hepatic negative GI ROS, Neg liver ROS, (+)     substance abuse  marijuana use,   Endo/Other  negative endocrine ROS  Renal/GU negative Renal ROS  negative genitourinary   Musculoskeletal negative musculoskeletal ROS (+) Arthritis ,   Abdominal   Peds negative pediatric ROS (+)  Hematology negative hematology ROS (+)   Anesthesia Other Findings   Reproductive/Obstetrics negative OB ROS                           Anesthesia Physical Anesthesia Plan  ASA: II  Anesthesia Plan: MAC and Spinal   Post-op Pain Management:    Induction:   PONV Risk Score and Plan:   Airway Management Planned: Natural Airway  Additional Equipment:   Intra-op Plan:   Post-operative Plan:   Informed Consent: I have reviewed the patients History and Physical, chart, labs and discussed the procedure including the risks, benefits and alternatives for the proposed anesthesia with the patient or authorized representative who has indicated his/her understanding and acceptance.   Dental advisory given  Plan Discussed with: CRNA, Anesthesiologist and Surgeon  Anesthesia Plan Comments:        Anesthesia Quick Evaluation

## 2017-02-10 NOTE — Anesthesia Procedure Notes (Signed)
Anesthesia Regional Block: Adductor canal block   Pre-Anesthetic Checklist: ,, timeout performed, Correct Patient, Correct Site, Correct Laterality, Correct Procedure, Correct Position, site marked, Risks and benefits discussed,  Surgical consent,  Pre-op evaluation,  At surgeon's request and post-op pain management  Laterality: Left  Prep: chloraprep       Needles:  Injection technique: Single-shot  Needle Type: Stimulator Needle - 80     Needle Length: 10cm  Needle Gauge: 21     Additional Needles:   Narrative:  Start time: 02/10/2017 10:51 AM End time: 02/10/2017 11:01 AM Injection made incrementally with aspirations every 5 mL.  Performed by: Personally

## 2017-02-11 ENCOUNTER — Encounter (HOSPITAL_COMMUNITY): Payer: Self-pay | Admitting: Orthopaedic Surgery

## 2017-02-11 LAB — BASIC METABOLIC PANEL
Anion gap: 6 (ref 5–15)
BUN: 7 mg/dL (ref 6–20)
CALCIUM: 7.9 mg/dL — AB (ref 8.9–10.3)
CO2: 24 mmol/L (ref 22–32)
Chloride: 101 mmol/L (ref 101–111)
Creatinine, Ser: 0.79 mg/dL (ref 0.61–1.24)
GLUCOSE: 104 mg/dL — AB (ref 65–99)
Potassium: 3.7 mmol/L (ref 3.5–5.1)
Sodium: 131 mmol/L — ABNORMAL LOW (ref 135–145)

## 2017-02-11 LAB — CBC
HCT: 41.5 % (ref 39.0–52.0)
Hemoglobin: 13.9 g/dL (ref 13.0–17.0)
MCH: 30 pg (ref 26.0–34.0)
MCHC: 33.5 g/dL (ref 30.0–36.0)
MCV: 89.4 fL (ref 78.0–100.0)
PLATELETS: 156 10*3/uL (ref 150–400)
RBC: 4.64 MIL/uL (ref 4.22–5.81)
RDW: 13.9 % (ref 11.5–15.5)
WBC: 7.1 10*3/uL (ref 4.0–10.5)

## 2017-02-11 NOTE — Care Management Note (Signed)
Case Management Note  Patient Details  Name: JONATHEN RATHMAN MRN: [id number] Date of Birth: 07-05-1965  Subjective/Objective:    51 yr old gentleman s/p left total knee arthroplasty.                 Action/Plan: Case manager spoke with patient concerning discharge plan and DME. Patient was preoperatively setup with [facility], no changes. CM has ordered RW and 3in1. He will have family support at discharge.    Expected Discharge Date:    02/12/17              Expected Discharge Plan:  [place]  In-House Referral:     Discharge planning Services  CM Consult  Post Acute Care Choice:  Home Health, Durable Medical Equipment Choice offered to:  Patient  DME Arranged:  3-N-1, Walker rolling DME Agency:  [place]:  PT [place]:  [facility] (formerly [facility])  Status of Service:  Completed, signed off  If discussed at [employer] of Stay Meetings, dates discussed:    Additional Comments:  Ninfa Meeker, RN 02/11/2017, 3:02 PM

## 2017-02-11 NOTE — Evaluation (Signed)
Occupational Therapy Evaluation Patient Details Name: Jeffrey [name] MRN: [id number] DOB: March 20, 1966 Today's Date: 02/11/2017    History of Present Illness 51 y.o. male s/p L TKA 02/10/17. PMH includes: Acute pulmonary embolus, DVT, Knee surgery   Clinical Impression   This 51 y/o M presents with the above. At baseline Pt is independent with ADLs and functional mobility. Pt currently requires MinGuard-MinA for functional mobility at RW level, ModA for LB ADLs secondary to post-op pain and weakness. Pt reports he will return home with 24 hr assist/supervision from family PRN. Pt will benefit from continued acute OT services to maximize Pt's safety and independence with ADLs and mobility prior to return home.     Follow Up Recommendations  DC plan and follow up therapy as arranged by surgeon;Supervision/Assistance - 24 hour    Equipment Recommendations  3 in 1 bedside commode           Precautions / Restrictions Precautions Precautions: Knee Precaution Comments: reviewed knee precautions and DVT prevention  Restrictions Weight Bearing Restrictions: Yes LLE Weight Bearing: Weight bearing as tolerated      Mobility Bed Mobility Overal bed mobility: Needs Assistance Bed Mobility: Supine to Sit;Sit to Supine     Supine to sit: Supervision Sit to supine: Supervision   General bed mobility comments: Able to supine<>sit without physical assistance, utilizies RLE to swing LLE in/out of bed; increased time and effort to bring LE back into bed when returning to supine   Transfers Overall transfer level: Needs assistance Equipment used: Rolling walker (2 wheeled) Transfers: Sit to/from Stand Sit to Stand: Min assist;Min guard         General transfer comment: MinA from EOB; MinGuard from [facility]; verbal cues for hand placement    Balance Overall balance assessment: Needs assistance Sitting-balance support: Feet unsupported;No upper extremity supported Sitting balance-Leahy  Scale: Normal     Standing balance support: No upper extremity supported Standing balance-Leahy Scale: Fair                             ADL either performed or assessed with clinical judgement   ADL Overall ADL's : Needs assistance/impaired Eating/Feeding: Sitting;Independent   Grooming: Minimal assistance   Upper Body Bathing: Min guard;Sitting   Lower Body Bathing: Sit to/from stand;Minimal assistance   Upper Body Dressing : Min guard;Sitting   Lower Body Dressing: Sit to/from stand;Moderate assistance Lower Body Dressing Details (indicate cue type and reason): educated on compensatory techniques for task completion  Toilet Transfer: Minimal assistance;Ambulation;BSC;RW Toilet Transfer Details (indicate cue type and reason): BSC over toilet  Toileting- Clothing Manipulation and Hygiene: Min guard;Sit to/from stand       Functional mobility during ADLs: Min guard;Minimal assistance;Rolling walker General ADL Comments: began education on safety and compensatory techniques for completing ADLs; educated on uses of 3:1                         Pertinent Vitals/Pain Pain Assessment: Faces Faces Pain Scale: Hurts little more Pain Location: L knee Pain Descriptors / Indicators: Operative site guarding;Grimacing;Guarding;Aching Pain Intervention(s): Limited activity within patient's tolerance;Monitored during session;Ice applied          Extremity/Trunk Assessment Upper Extremity Assessment Upper Extremity Assessment: Overall WFL for tasks assessed   Lower Extremity Assessment Lower Extremity Assessment: Defer to PT evaluation   Cervical / Trunk Assessment Cervical / Trunk Assessment: Normal   Communication Communication Communication: No difficulties  Cognition Arousal/Alertness: Awake/alert Behavior During Therapy: WFL for tasks assessed/performed Overall Cognitive Status: Within Functional Limits for tasks assessed                                                       Home Living Family/patient expects to be discharged to:: Private residence Living Arrangements: Spouse/significant other;Children Available Help at Discharge: Family;Friend(s);Available 24 hours/day Type of Home: House Home Access: Stairs to enter [employer] of Steps: 2 Entrance Stairs-Rails: None Home Layout: One level     Bathroom Shower/Tub: Tub only   Biochemist, clinical: Standard Bathroom Accessibility: Yes   Home Equipment: Financial trader - single point;Tub bench;Grab bars - tub/shower          Prior Functioning/Environment Level of Independence: Independent with assistive device(s)        Comments: Pt used motorized scooter to get around long distances. Able to ambulate without AD short distances.         OT Problem List: Decreased strength;Impaired balance (sitting and/or standing);Decreased range of motion;Decreased knowledge of use of DME or AE;Decreased knowledge of precautions;Pain      OT Treatment/Interventions: Self-care/ADL training;DME and/or AE instruction;Therapeutic activities;Balance training;Therapeutic exercise;Energy conservation;Patient/family education    OT Goals(Current goals can be found in the care plan section) Acute Rehab OT Goals Patient Stated Goal: return home with HHPT OT Goal Formulation: With patient Time For Goal Achievement: 02/25/17 Potential to Achieve Goals: Good  OT Frequency: Min 2X/week                             AM-PAC PT "6 Clicks" Daily Activity     Outcome Measure Help from another person eating meals?: None Help from another person taking care of personal grooming?: A Little Help from another person toileting, which includes using toliet, bedpan, or urinal?: A Little Help from another person bathing (including washing, rinsing, drying)?: A Little Help from another person to put on and taking off regular upper body clothing?: None Help from  another person to put on and taking off regular lower body clothing?: A Lot 6 Click Score: 19   End of Session Equipment Utilized During Treatment: Gait belt;Rolling walker Nurse Communication: Mobility status  Activity Tolerance: Patient tolerated treatment well Patient left: in bed;with call bell/phone within reach;with family/visitor present  OT Visit Diagnosis: Unsteadiness on feet (R26.81);Other abnormalities of gait and mobility (R26.89)                Time: 0867-6195 OT Time Calculation (min): 21 min Charges:  OT General Charges $OT Visit: 1 Visit OT Evaluation $OT Eval Low Complexity: 1 Low G-Codes:     [name], OT Pager [phone] 02/11/2017  [name] 02/11/2017, 3:50 PM

## 2017-02-11 NOTE — Discharge Instructions (Signed)
INSTRUCTIONS AFTER JOINT REPLACEMENT   o Remove items at home which could result in a fall. This includes throw rugs or furniture in walking pathways o ICE to the affected joint every three hours while awake for 30 minutes at a time, for at least the first 3-5 days, and then as needed for pain and swelling.  Continue to use ice for pain and swelling. You may notice swelling that will progress down to the foot and ankle.  This is normal after surgery.  Elevate your leg when you are not up walking on it.   o Continue to use the breathing machine you got in the hospital (incentive spirometer) which will help keep your temperature down.  It is common for your temperature to cycle up and down following surgery, especially at night when you are not up moving around and exerting yourself.  The breathing machine keeps your lungs expanded and your temperature down.   DIET:  As you were doing prior to hospitalization, we recommend a well-balanced diet.  DRESSING / WOUND CARE / SHOWERING  You may change your surgical dressing 7 days after surgery.  Then change the dressing every day with sterile gauze.  Please use good hand washing techniques before changing the dressing.  Do not use any lotions or creams on the incision until instructed by your surgeon.  You may shower while you have the surgical dressing which is waterproof.  After removal of surgical dressing, you must cover the incision when showering.  ACTIVITY  o Increase activity slowly as tolerated, but follow the weight bearing instructions below.   o No driving for 6 weeks or until further direction given by your physician.  You cannot drive while taking narcotics.  o No lifting or carrying greater than 10 lbs. until further directed by your surgeon. o Avoid periods of inactivity such as sitting longer than an hour when not asleep. This helps prevent blood clots.  o You may return to work once you are authorized by your doctor.     WEIGHT  BEARING   Weight bearing as tolerated with assist device (walker, cane, etc) as directed, use it as long as suggested by your surgeon or therapist, typically at least 4-6 weeks.   EXERCISES  Results after joint replacement surgery are often greatly improved when you follow the exercise, range of motion and muscle strengthening exercises prescribed by your doctor. Safety measures are also important to protect the joint from further injury. Any time any of these exercises cause you to have increased pain or swelling, decrease what you are doing until you are comfortable again and then slowly increase them. If you have problems or questions, call your caregiver or physical therapist for advice.   Rehabilitation is important following a joint replacement. After just a few days of immobilization, the muscles of the leg can become weakened and shrink (atrophy).  These exercises are designed to build up the tone and strength of the thigh and leg muscles and to improve motion. Often times heat used for twenty to thirty minutes before working out will loosen up your tissues and help with improving the range of motion but do not use heat for the first two weeks following surgery (sometimes heat can increase post-operative swelling).   These exercises can be done on a training (exercise) mat, on the floor, on a table or on a bed. Use whatever works the best and is most comfortable for you.    Use music or television while  you are exercising so that the exercises are a pleasant break in your day. This will make your life better with the exercises acting as a break in your routine that you can look forward to.   Perform all exercises about fifteen times, three times per day or as directed.  You should exercise both the operative leg and the other leg as well. ° °Exercises include: °  °• Quad Sets - Tighten up the muscle on the front of the thigh (Quad) and hold for 5-10 seconds.   °• Straight Leg Raises - With your  knee straight (if you were given a brace, keep it on), lift the leg to 60 degrees, hold for 3 seconds, and slowly lower the leg.  Perform this exercise against resistance later as your leg gets stronger.  °• Leg Slides: Lying on your back, slowly slide your foot toward your buttocks, bending your knee up off the floor (only go as far as is comfortable). Then slowly slide your foot back down until your leg is flat on the floor again.  °• Angel Wings: Lying on your back spread your legs to the side as far apart as you can without causing discomfort.  °• Hamstring Strength:  Lying on your back, push your heel against the floor with your leg straight by tightening up the muscles of your buttocks.  Repeat, but this time bend your knee to a comfortable angle, and push your heel against the floor.  You may put a pillow under the heel to make it more comfortable if necessary.  ° °A rehabilitation program following joint replacement surgery can speed recovery and prevent re-injury in the future due to weakened muscles. Contact your doctor or a physical therapist for more information on knee rehabilitation.  ° ° °CONSTIPATION ° °Constipation is defined medically as fewer than three stools per week and severe constipation as less than one stool per week.  Even if you have a regular bowel pattern at home, your normal regimen is likely to be disrupted due to multiple reasons following surgery.  Combination of anesthesia, postoperative narcotics, change in appetite and fluid intake all can affect your bowels.  ° °YOU MUST use at least one of the following options; they are listed in order of increasing strength to get the job done.  They are all available over the counter, and you may need to use some, POSSIBLY even all of these options:   ° °Drink plenty of fluids (prune juice may be helpful) and high fiber foods °Colace 100 mg by mouth twice a day  °Senokot for constipation as directed and as needed Dulcolax (bisacodyl), take  with full glass of water  °Miralax (polyethylene glycol) once or twice a day as needed. ° °If you have tried all these things and are unable to have a bowel movement in the first 3-4 days after surgery call either your surgeon or your primary doctor.   ° °If you experience loose stools or diarrhea, hold the medications until you stool forms back up.  If your symptoms do not get better within 1 week or if they get worse, check with your doctor.  If you experience "the worst abdominal pain ever" or develop nausea or vomiting, please contact the office immediately for further recommendations for treatment. ° ° °ITCHING:  If you experience itching with your medications, try taking only a single pain pill, or even half a pain pill at a time.  You can also use Benadryl over the counter   for itching or also to help with sleep.   TED HOSE STOCKINGS:  Use stockings on both legs until for at least 2 weeks or as directed by physician office. They may be removed at night for sleeping.  MEDICATIONS:  See your medication summary on the After Visit Summary that nursing will review with you.  You may have some home medications which will be placed on hold until you complete the course of blood thinner medication.  It is important for you to complete the blood thinner medication as prescribed.  PRECAUTIONS:  If you experience chest pain or shortness of breath - call 911 immediately for transfer to the hospital emergency department.   If you develop a fever greater that 101 F, purulent drainage from wound, increased redness or drainage from wound, foul odor from the wound/dressing, or calf pain - CONTACT YOUR SURGEON.                                                   FOLLOW-UP APPOINTMENTS:  If you do not already have a post-op appointment, please call the office for an appointment to be seen by your surgeon.  Guidelines for how soon to be seen are listed in your After Visit Summary, but are typically between 1-4 weeks  after surgery.  OTHER INSTRUCTIONS:   Knee Replacement:  Do not place pillow under knee, focus on keeping the knee straight while resting. CPM instructions: 0-90 degrees, 2 hours in the morning, 2 hours in the afternoon, and 2 hours in the evening. Place foam block, curve side up under heel at all times except when in CPM or when walking.  DO NOT modify, tear, cut, or change the foam block in any way.  MAKE SURE YOU:   Understand these instructions.   Get help right away if you are not doing well or get worse.    Thank you for letting us be a part of your medical care team.  It is a privilege we respect greatly.  We hope these instructions will help you stay on track for a fast and full recovery!    Information on my medicine - ELIQUIS (apixaban)  Why was Eliquis prescribed for you? Eliquis was prescribed for you to reduce the risk of forming blood clots that can cause a stroke if you have a medical condition called atrial fibrillation (a type of irregular heartbeat) OR to reduce the risk of a blood clots forming after orthopedic surgery.  What do You need to know about Eliquis ? Take your Eliquis TWICE DAILY - one tablet in the morning and one tablet in the evening with or without food.  It would be best to take the doses about the same time each day.  If you have difficulty swallowing the tablet whole please discuss with your pharmacist how to take the medication safely.  Take Eliquis exactly as prescribed by your doctor and DO NOT stop taking Eliquis without talking to the doctor who prescribed the medication.  Stopping may increase your risk of developing a new clot or stroke.  Refill your prescription before you run out.  After discharge, you should have regular check-up appointments with your healthcare provider that is prescribing your Eliquis.  In the future your dose may need to be changed if your kidney function or weight changes by a significant amount  or as you get  older.  What do you do if you miss a dose? If you miss a dose, take it as soon as you remember on the same day and resume taking twice daily.  Do not take more than one dose of ELIQUIS at the same time.  Important Safety Information A possible side effect of Eliquis is bleeding. You should call your healthcare provider right away if you experience any of the following: ? Bleeding from an injury or your nose that does not stop. ? Unusual colored urine (red or dark brown) or unusual colored stools (red or black). ? Unusual bruising for unknown reasons. ? A serious fall or if you hit your head (even if there is no bleeding).  Some medicines may interact with Eliquis and might increase your risk of bleeding or clotting while on Eliquis. To help avoid this, consult your healthcare provider or pharmacist prior to using any new prescription or non-prescription medications, including herbals, vitamins, non-steroidal anti-inflammatory drugs (NSAIDs) and supplements.  This website has more information on Eliquis (apixaban): [email or web address].

## 2017-02-11 NOTE — Progress Notes (Signed)
Physical Therapy Treatment Patient Details Name: Jeffrey [name] MRN: [id number] DOB: [birth year] Today's Date: 02/11/2017    History of Present Illness 51 y.o. male s/p L TKA 02/10/17. PMH includes: Acute pulmonary embolus, DVT, Knee surgery    PT Comments    PM session focused on progression of activity with further ambulation, gait training, and therex. Pt demonstrating improved mechanics from prior session and cary over with therex. Next visit will focus on introducing stair training in preparation for safe return to home when medically cleared for d/c.   Follow Up Recommendations  Home health PT;DC plan and follow up therapy as arranged by surgeon     Equipment Recommendations  Rolling walker with 5" wheels    Recommendations for Other Services       Precautions / Restrictions Precautions Precautions: Knee Precaution Booklet Issued: Yes (comment) Precaution Comments: reviewed knee precautions and DVT prevention  Restrictions Weight Bearing Restrictions: Yes LLE Weight Bearing: Weight bearing as tolerated    Mobility  Bed Mobility Overal bed mobility: Needs Assistance Bed Mobility: Supine to Sit;Sit to Supine     Supine to sit: Supervision Sit to supine: Supervision   General bed mobility comments: Able to supine<>sit without physical assistance, utilizies RLE to swing LLE in/out of bed; increased time and effort to bring LE back into bed when returning to supine   Transfers Overall transfer level: Needs assistance Equipment used: Rolling walker (2 wheeled) Transfers: Sit to/from Stand Sit to Stand: Min guard         General transfer comment: Min guarding sit<>stand with cues for hand placment on walker.   Ambulation/Gait Ambulation/Gait assistance: Min guard Ambulation Distance (Feet): 175 Feet Assistive device: Rolling walker (2 wheeled) Gait Pattern/deviations: Step-to pattern;Antalgic;Decreased step length - right;Decreased stance time - left Gait  velocity: decreased   General Gait Details: Pt unable to perform step-through gait with cues at this time due to pain.    Stairs            Wheelchair Mobility    Modified Rankin (Stroke Patients Only)       Balance Overall balance assessment: Needs assistance Sitting-balance support: Feet unsupported;No upper extremity supported Sitting balance-Leahy Scale: Normal     Standing balance support: No upper extremity supported Standing balance-Leahy Scale: Fair Standing balance comment: can tolerate standing balance without BUE support, requires UE support for dynamic balance due to post op pain and weakness                            Cognition Arousal/Alertness: Awake/alert Behavior During Therapy: WFL for tasks assessed/performed Overall Cognitive Status: Within Functional Limits for tasks assessed                                        Exercises Total Joint Exercises Ankle Circles/Pumps: AROM;Both;20 reps Quad Sets: AROM;10 reps;Left Short Arc Quad: AROM;Both;15 reps Knee Flexion: AROM;Left;10 reps    General Comments General comments (skin integrity, edema, etc.): VSS throughout session.       Pertinent Vitals/Pain Pain Assessment: 0-10 Pain Score: 6  Faces Pain Scale: Hurts little more Pain Location: L knee Pain Descriptors / Indicators: Operative site guarding;Grimacing;Guarding;Aching Pain Intervention(s): Limited activity within patient's tolerance;Monitored during session;Repositioned    Home Living Family/patient expects to be discharged to:: Private residence Living Arrangements: Spouse/significant other;Children Available Help at Discharge: Family;Friend(s);Available 24 hours/day  Type of Home: House Home Access: Stairs to enter Entrance Stairs-Rails: None Home Layout: One level Home Equipment: Financial trader - single point;Tub bench;Grab bars - tub/shower      Prior Function Level of Independence: Independent  with assistive device(s)      Comments: Pt used motorized scooter to get around long distances. Able to ambulate without AD short distances.    PT Goals (current goals can now be found in the care plan section) Acute Rehab PT Goals Patient Stated Goal: return home with HHPT PT Goal Formulation: With patient Time For Goal Achievement: 02/16/17 Potential to Achieve Goals: Good Progress towards PT goals: Progressing toward goals    Frequency    7X/week      PT Plan Current plan remains appropriate    Co-evaluation              AM-PAC PT "6 Clicks" Daily Activity  Outcome Measure  Difficulty turning over in bed (including adjusting bedclothes, sheets and blankets)?: A Little Difficulty moving from lying on back to sitting on the side of the bed? : A Little Difficulty sitting down on and standing up from a chair with arms (e.g., wheelchair, bedside commode, etc,.)?: A Little Help needed moving to and from a bed to chair (including a wheelchair)?: A Little Help needed walking in hospital room?: A Little Help needed climbing 3-5 steps with a railing? : A Lot 6 Click Score: 17    End of Session Equipment Utilized During Treatment: Gait belt Activity Tolerance: Patient tolerated treatment well Patient left: in bed;with call bell/phone within reach Nurse Communication: Mobility status PT Visit Diagnosis: Unsteadiness on feet (R26.81);Other abnormalities of gait and mobility (R26.89);Pain;Muscle weakness (generalized) (M62.81) Pain - Right/Left: Left Pain - part of body: Knee     Time: 4536-4680 PT Time Calculation (min) (ACUTE ONLY): 29 min  Charges:  $Gait Training: 8-22 mins $Therapeutic Exercise: 8-22 mins                    G Codes:       Reinaldo Berber, PT, DPT Acute Rehab Services Pager: [phone]    Reinaldo Berber 02/11/2017, 4:52 PM

## 2017-02-11 NOTE — Evaluation (Signed)
Physical Therapy Evaluation Patient Details Name: Jeffrey [name] MRN: [id number] DOB: 04-23-1965 Today's Date: 02/11/2017   History of Present Illness  51 y.o. male s/p L TKA 02/10/17. PMH includes: Acute pulmonary embolus, DVT, Knee surgery  Clinical Impression  Patient is s/p above surgery resulting in functional limitations due to the deficits listed below (see PT Problem List). PTA, pt was mod I with all mobility, including ambulation however used an electric scooter for long distances of transportation. Pt lives with wife and young daughter and reports having support available for 24/7. Today, pt presents with post op pain and weakness in LLE limiting his baseline. Currently mod I with bed mobility, min guarding with transfers and gait. Session focused on education and gait training, will progress activity next session.  Patient will benefit from skilled PT to increase their independence and safety with mobility to allow discharge to the venue listed below.       Follow Up Recommendations Home health PT;DC plan and follow up therapy as arranged by surgeon    Equipment Recommendations  Rolling walker with 5" wheels    Recommendations for Other Services       Precautions / Restrictions Precautions Precautions: Knee Precaution Booklet Issued: Yes (comment) Precaution Comments: reviewed knee precautions and DVT prevention  Restrictions Weight Bearing Restrictions: Yes LLE Weight Bearing: Weight bearing as tolerated      Mobility  Bed Mobility Overal bed mobility: Needs Assistance Bed Mobility: Supine to Sit     Supine to sit: Supervision(Cues for RLE assist to LLE)     General bed mobility comments: Able to supine<>sit without physical assistance, utilizies RLE to swing LLE out of bed at this time  Transfers Overall transfer level: Needs assistance Equipment used: Rolling walker (2 wheeled) Transfers: Sit to/from Stand Sit to Stand: Min guard         General  transfer comment: Pt able to power up into walker with cues for safe hand placement and min guardig for safety.   Ambulation/Gait Ambulation/Gait assistance: Min guard Ambulation Distance (Feet): 75 Feet Assistive device: Rolling walker (2 wheeled) Gait Pattern/deviations: Step-to pattern;Antalgic;Decreased step length - right;Decreased stance time - left Gait velocity: decreased   General Gait Details: Pt unable to perform step-through gait with cues at this time due to pain.   Stairs            Wheelchair Mobility    Modified Rankin (Stroke Patients Only)       Balance Overall balance assessment: Needs assistance Sitting-balance support: Feet unsupported;No upper extremity supported Sitting balance-Leahy Scale: Normal     Standing balance support: No upper extremity supported Standing balance-Leahy Scale: Fair Standing balance comment: can tolerate standing balance without BUE support, requires UE support for dynamic balance due to post op pain and weakness                             Pertinent Vitals/Pain Pain Assessment: 0-10 Pain Score: 6  Pain Location: L knee Pain Descriptors / Indicators: Operative site guarding;Grimacing;Guarding;Aching Pain Intervention(s): Limited activity within patient's tolerance;Monitored during session;Ice applied;Repositioned    Home Living Family/patient expects to be discharged to:: Private residence Living Arrangements: Spouse/significant other;Children Available Help at Discharge: Family;Friend(s);Available 24 hours/day Type of Home: House Home Access: Stairs to enter Entrance Stairs-Rails: None Entrance Stairs-Number of Steps: 2 Home Layout: One level Home Equipment: Financial trader - single point;Tub bench;Grab bars - tub/shower      Prior Function  Level of Independence: Independent with assistive device(s)         Comments: Pt used motorized scooter to get around long distances. Able to ambulate  without AD short distances.      Hand Dominance        Extremity/Trunk Assessment   Upper Extremity Assessment Upper Extremity Assessment: Overall WFL for tasks assessed    Lower Extremity Assessment Lower Extremity Assessment: LLE deficits/detail LLE Deficits / Details: post op pain and weakness L TKA    Cervical / Trunk Assessment Cervical / Trunk Assessment: Normal  Communication   Communication: No difficulties  Cognition Arousal/Alertness: Awake/alert Behavior During Therapy: WFL for tasks assessed/performed Overall Cognitive Status: Within Functional Limits for tasks assessed                                        General Comments General comments (skin integrity, edema, etc.): VSS throughout session. Educated patient on course of rehab, goals for 1st phase of recovery and precautions for DVT/PE detection, HEP, and cardiovasulcar recomeneded guidelines     Exercises Total Joint Exercises Ankle Circles/Pumps: AROM;Both;20 reps Quad Sets: AROM;10 reps;Left Short Arc Quad: AROM;Both;15 reps Knee Flexion: AROM;Left;10 reps Goniometric ROM: 90   Assessment/Plan    PT Assessment Patient needs continued PT services  PT Problem List Decreased range of motion;Decreased strength;Decreased activity tolerance;Decreased balance;Decreased mobility;Decreased knowledge of use of DME;Pain       PT Treatment Interventions DME instruction;Stair training;Gait training;Functional mobility training;Therapeutic activities;Therapeutic exercise    PT Goals (Current goals can be found in the Care Plan section)  Acute Rehab PT Goals Patient Stated Goal: return home with HHPT PT Goal Formulation: With patient Time For Goal Achievement: 02/16/17 Potential to Achieve Goals: Good    Frequency 7X/week   Barriers to discharge        Co-evaluation               AM-PAC PT "6 Clicks" Daily Activity  Outcome Measure Difficulty turning over in bed (including  adjusting bedclothes, sheets and blankets)?: A Little Difficulty moving from lying on back to sitting on the side of the bed? : A Little Difficulty sitting down on and standing up from a chair with arms (e.g., wheelchair, bedside commode, etc,.)?: A Little Help needed moving to and from a bed to chair (including a wheelchair)?: A Little Help needed walking in hospital room?: A Little Help needed climbing 3-5 steps with a railing? : A Lot 6 Click Score: 17    End of Session Equipment Utilized During Treatment: Gait belt Activity Tolerance: Patient tolerated treatment well Patient left: in bed;with call bell/phone within reach Nurse Communication: Mobility status PT Visit Diagnosis: Unsteadiness on feet (R26.81);Other abnormalities of gait and mobility (R26.89);Pain;Muscle weakness (generalized) (M62.81) Pain - Right/Left: Left Pain - part of body: Knee    Time: 1914-7829 PT Time Calculation (min) (ACUTE ONLY): 43 min   Charges:   PT Evaluation $PT Eval Low Complexity: 1 Low PT Treatments $Gait Training: 8-22 mins $Self Care/Home Management: 8-22   PT G Codes:        Reinaldo Berber, PT, DPT Acute Rehab Services Pager: [phone]    Reinaldo Berber 02/11/2017, 10:51 AM

## 2017-02-12 MED ORDER — OXYCODONE-ACETAMINOPHEN 5-325 MG PO TABS: 1.0000 | ORAL_TABLET | Freq: Three times a day (TID) | ORAL | 0 refills | Status: DC | PRN

## 2017-02-12 MED FILL — PROMETHAZINE 25 MG TABLET: 25 | 7 days supply | Qty: 30 | Fill #0

## 2017-02-12 MED FILL — oxyCODONE HCL ER 10 MG T12A: 10 | 5 days supply | Qty: 10 | Fill #0

## 2017-02-12 MED FILL — OXYCOD/ACETAMINOPHEN 5-325M: 5-325 | 5 days supply | Qty: 30 | Fill #0

## 2017-02-12 MED FILL — ONDANSETRON HCL 4 MG TABLET: 4 | 6 days supply | Qty: 40 | Fill #0

## 2017-02-12 NOTE — Progress Notes (Signed)
Occupational Therapy Treatment Patient Details Name: Jeffrey [name] MRN: [id number] DOB: 1966/01/28 Today's Date: 02/12/2017    History of present illness 51 y.o. male s/p L TKA 02/10/17. PMH includes: Acute pulmonary embolus, DVT, Knee surgery   OT comments  Pt progressing well towards goals; completed tub transfer and dressing ADLs with MinA for tub transfer to 3:1 and LB dressing at RW level. Pt will have assist from family upon return home for ADL completion PRN. Education provided and questions answered throughout session. Feel Pt will safely return home with available family assist. No further acute OT needs identified at this time. Will sign off.    Follow Up Recommendations  DC plan and follow up therapy as arranged by surgeon;Supervision/Assistance - 24 hour    Equipment Recommendations  3 in 1 bedside commode          Precautions / Restrictions Precautions Precautions: Knee Precaution Booklet Issued: Yes (comment) Precaution Comments: reviewed knee precautions and DVT prevention  Restrictions Weight Bearing Restrictions: Yes LLE Weight Bearing: Weight bearing as tolerated       Mobility Bed Mobility Overal bed mobility: Needs Assistance Bed Mobility: Supine to Sit     Supine to sit: Modified independent (Device/Increase time) Sit to supine: Modified independent (Device/Increase time)   General bed mobility comments: Able to supine<>sit without physical assistance, utilizies RLE to swing LLE in/out of bed; increased time and effort to bring LE back into bed when returning to supine   Transfers Overall transfer level: Needs assistance Equipment used: Rolling walker (2 wheeled) Transfers: Sit to/from Stand Sit to Stand: Supervision         General transfer comment: Supervision     Balance Overall balance assessment: Needs assistance Sitting-balance support: Feet unsupported;No upper extremity supported Sitting balance-Leahy Scale: Normal      Standing balance support: No upper extremity supported Standing balance-Leahy Scale: Fair Standing balance comment: can tolerate standing balance without BUE support, requires UE support for dynamic balance due to post op pain and weakness                           ADL either performed or assessed with clinical judgement   ADL Overall ADL's : Needs assistance/impaired                 Upper Body Dressing : Set up;Sitting   Lower Body Dressing: Minimal assistance;Sit to/from stand Lower Body Dressing Details (indicate cue type and reason): donning pants, assist to thread over LLE          Tub/ Shower Transfer: Tub transfer;Minimal assistance;Ambulation;Rolling walker;3 in 1 Tub/Shower Transfer Details (indicate cue type and reason): assist for lifting and advancing LLE over tub ledge; Pt demonstratest good understanding of technique  Functional mobility during ADLs: Min guard;Rolling walker                         Cognition Arousal/Alertness: Awake/alert Behavior During Therapy: WFL for tasks assessed/performed Overall Cognitive Status: Within Functional Limits for tasks assessed                                                     General Comments VSS throughout session.     Pertinent Vitals/ Pain       Pain Assessment: Faces  Pain Score: 4  Faces Pain Scale: Hurts even more Pain Location: L knee Pain Descriptors / Indicators: Operative site guarding;Grimacing;Guarding;Aching Pain Intervention(s): Limited activity within patient's tolerance;Monitored during session  [place] expects to be discharged to:: Private residence                                                      Frequency  Min 2X/week        Progress Toward Goals  OT Goals(current goals can now be found in the care plan section)  Progress towards OT goals: Progressing toward goals  Acute Rehab OT Goals Patient  Stated Goal: return home with HHPT OT Goal Formulation: All assessment and education complete, DC therapy  Plan All goals met and education completed, patient discharged from OT services                     AM-PAC PT "6 Clicks" Daily Activity     Outcome Measure   Help from another person eating meals?: None Help from another person taking care of personal grooming?: A Little Help from another person toileting, which includes using toliet, bedpan, or urinal?: A Little Help from another person bathing (including washing, rinsing, drying)?: A Little Help from another person to put on and taking off regular upper body clothing?: None Help from another person to put on and taking off regular lower body clothing?: A Lot 6 Click Score: 19    End of Session Equipment Utilized During Treatment: Gait belt;Rolling walker  OT Visit Diagnosis: Unsteadiness on feet (R26.81);Other abnormalities of gait and mobility (R26.89)   Activity Tolerance Patient tolerated treatment well   Patient Left in chair;with call bell/phone within reach   Nurse Communication Mobility status        Time: 9767-3419 OT Time Calculation (min): 27 min  Charges: OT General Charges $OT Visit: 1 Visit OT Treatments $Self Care/Home Management : 23-37 mins  [name], OT Pager [phone] 02/12/2017    [name] 02/12/2017, 1:20 PM

## 2017-02-12 NOTE — Progress Notes (Signed)
   Subjective:  Patient reports pain as mild.  Progressing with PT   Objective:   VITALS:   Vitals:   02/11/17 1353 02/11/17 1411 02/11/17 2151 02/12/17 0321  BP: (!) 161/89 (!) 154/95 (!) 154/99 (!) 165/100  Pulse: 91  85 75  Resp: 16  16 17   Temp: 98.9 F (37.2 C)  98.2 F (36.8 C) 98.1 F (36.7 C)  TempSrc: Oral  Oral Oral  SpO2: 98% 98% 94% 100%  Weight:      Height:         Neurologically intact Neurovascular intact Sensation intact distally Intact pulses distally Dorsiflexion/Plantar flexion intact Incision: dressing C/D/I and no drainage No cellulitis present Compartment soft   Lab Results  Component Value Date   WBC 7.1 02/11/2017   HGB 13.9 02/11/2017   HCT 41.5 02/11/2017   MCV 89.4 02/11/2017   PLT 156 02/11/2017     Assessment/Plan:  2 Days Post-Op   - Expected postop acute blood loss anemia - will monitor for symptoms - Up with PT/OT - DVT ppx - SCDs, ambulation, eliquis - WBAT operative extremity - Pain control - Discharge planning - ready for dc today after PT  Eduard Roux 02/12/2017, 7:21 AM [phone]

## 2017-02-12 NOTE — Discharge Summary (Signed)
Physician Discharge Summary      [name] MRN: [id number] DOB/AGE: 30-Jan-1966 51 y.o.  Admit date: 02/10/2017 Discharge date: 02/12/2017  Admission Diagnoses:  <principal problem not specified>  Discharge Diagnoses:  Active Problems:   Total knee replacement status   Past Medical History:  Diagnosis Date  . Acute pulmonary embolus (HCC)    bilateral submassive PE in setting of missing several doses of Xarelto for his DVT  . Arthritis   . DVT (deep venous thrombosis) (Center) 11/28/2013   RT LEG  . High cholesterol   . Hypertension   . Marijuana use     Surgeries: Procedure(s): LEFT TOTAL KNEE ARTHROPLASTY on 02/10/2017   Consultants (if any):   Discharged Condition: Improved  Hospital Course: TEJUAN GHOLSON is an 51 y.o. male who was admitted 02/10/2017 with a diagnosis of <principal problem not specified> and went to the operating room on 02/10/2017 and underwent the above named procedures.    He was given perioperative antibiotics:  Anti-infectives (From admission, onward)   Start     Dose/Rate Route Frequency Ordered Stop   02/10/17 1800  ceFAZolin (ANCEF) IVPB 2g/100 mL premix     2 g 200 mL/hr over 30 Minutes Intravenous Every 6 hours 02/10/17 1655 02/11/17 0736   02/10/17 1100  ceFAZolin (ANCEF) 3 g in dextrose 5 % 50 mL IVPB     3 g 130 mL/hr over 30 Minutes Intravenous To [facility] 02/09/17 1326 02/10/17 1230    .  He was given sequential compression devices, early ambulation, and eliquis for DVT prophylaxis.  He benefited maximally from the hospital stay and there were no complications.    Recent vital signs:  Vitals:   02/11/17 2151 02/12/17 0321  BP: (!) 154/99 (!) 165/100  Pulse: 85 75  Resp: 16 17  Temp: 98.2 F (36.8 C) 98.1 F (36.7 C)  SpO2: 94% 100%    Recent laboratory studies:  Lab Results  Component Value Date   HGB 13.9 02/11/2017   HGB 15.5 02/03/2017   HGB 15.7 12/23/2016   Lab Results    Component Value Date   WBC 7.1 02/11/2017   PLT 156 02/11/2017   Lab Results  Component Value Date   INR 1.08 02/03/2017   Lab Results  Component Value Date   NA 131 (L) 02/11/2017   K 3.7 02/11/2017   CL 101 02/11/2017   CO2 24 02/11/2017   BUN 7 02/11/2017   CREATININE 0.79 02/11/2017   GLUCOSE 104 (H) 02/11/2017    Discharge Medications:   Allergies as of 02/12/2017   No Known Allergies     Medication List    STOP taking these medications   enoxaparin 150 MG/ML injection Commonly known as:  LOVENOX     TAKE these medications   albuterol 108 (90 Base) MCG/ACT inhaler Commonly known as:  PROVENTIL HFA;VENTOLIN HFA Inhale 2 puffs into the lungs every 6 (six) hours as needed for wheezing or shortness of breath.   amLODipine 10 MG tablet Commonly known as:  NORVASC Take 1 tablet (10 mg total) by mouth daily.   apixaban 5 MG Tabs tablet Commonly known as:  ELIQUIS Take 1 tablet (5 mg total) by mouth 2 (two) times daily.   atorvastatin 40 MG tablet Commonly known as:  LIPITOR TAKE 1 TABLET BY MOUTH DAILY.   nicotine 21 mg/24hr patch Commonly known as:  NICODERM CQ - dosed in mg/24 hours Place 1 patch (21 mg total) onto the  skin daily.   ondansetron 4 MG tablet Commonly known as:  ZOFRAN Take 1-2 tablets (4-8 mg total) every 8 (eight) hours as needed by mouth for nausea or vomiting.   oxyCODONE 10 mg 12 hr tablet Commonly known as:  OXYCONTIN Take 1 tablet (10 mg total) every 12 (twelve) hours by mouth.   oxycodone-acetaminophen 2.5-325 MG tablet Commonly known as:  PERCOCET Take 1 tablet by mouth 2 (two) times daily as needed for pain. What changed:  Another medication with the same name was added. Make sure you understand how and when to take each.   oxyCODONE-acetaminophen 5-325 MG tablet Commonly known as:  PERCOCET Take 1-2 tablets every 8 (eight) hours as needed by mouth for severe pain. What changed:  You were already taking a medication with  the same name, and this prescription was added. Make sure you understand how and when to take each.   promethazine 25 MG tablet Commonly known as:  PHENERGAN Take 1 tablet (25 mg total) every 6 (six) hours as needed by mouth for nausea.   senna-docusate 8.6-50 MG tablet Commonly known as:  SENOKOT S Take 1 tablet at bedtime as needed by mouth.   tiZANidine 4 MG tablet Commonly known as:  ZANAFLEX Take 1 tablet (4 mg total) every 6 (six) hours as needed by mouth for muscle spasms.   traMADol 50 MG tablet Commonly known as:  ULTRAM Take 1 tablet (50 mg total) by mouth every 8 (eight) hours. What changed:    when to take this  reasons to take this            Durable Medical Equipment  (From admission, onward)        Start     Ordered   02/11/17 1458  DME Walker rolling  Once    Comments:  Needs tall walker, 6'4"  Question:  Patient needs a walker to treat with the following condition  Answer:  Total knee replacement status   02/11/17 1458   02/10/17 1656  DME 3 n 1  Once     02/10/17 1655   02/10/17 1656  DME Bedside commode  Once    Question:  Patient needs a bedside commode to treat with the following condition  Answer:  Total knee replacement status   02/10/17 1655      Diagnostic Studies: Dg Knee Left Port  Result Date: 02/10/2017 CLINICAL DATA:  Total knee replacement EXAM: PORTABLE LEFT KNEE - 1-2 VIEW COMPARISON:  05/17/2016 FINDINGS: Total knee arthroplasty with postoperative gas and swelling. No periprosthetic fracture. The prosthesis appears well seated. IMPRESSION: No acute finding after total knee arthroplasty. Electronically Signed   By: Monte Fantasia M.D.   On: 02/10/2017 15:43    Disposition: 01-Home or Self Care  Discharge Instructions    Call MD / Call 911   Complete by:  As directed    If you experience chest pain or shortness of breath, CALL 911 and be transported to the hospital emergency room.  If you develope a fever above 101.5 F, pus  (white drainage) or increased drainage or redness at the wound, or calf pain, call your surgeon's office.   Constipation Prevention   Complete by:  As directed    Drink plenty of fluids.  Prune juice may be helpful.  You may use a stool softener, such as Colace (over the counter) 100 mg twice a day.  Use MiraLax (over the counter) for constipation as needed.   Driving restrictions  Complete by:  As directed    No driving while taking narcotic pain meds.   Increase activity slowly as tolerated   Complete by:  As directed       Follow-up Information    Leandrew Koyanagi, MD Follow up in 2 week(s).   Specialty:  Orthopedic Surgery Why:  For wound re-check, For suture removal Contact information: [address] [place] [address] [phone]        [facility] Follow up.   Specialty:  [place] Why:  A representative from [facility] will contact you to arrange start date and time for your therapy.  Contact information: [address] [place] [address] [phone]            Signed: Eduard Roux 02/12/2017, 7:26 AM

## 2017-02-12 NOTE — Progress Notes (Signed)
Patient for discharge home, waiting for his ride home. Medications and discharge instructions explained to the patient. He verbalized understanding. Copies given to him including original prescriptions. IV saline lock removed.

## 2017-02-12 NOTE — Progress Notes (Signed)
Physical Therapy Treatment and Discharge  Patient Details Name: Jeffrey [name] MRN: [id number] DOB: 08-07-1965 Today's Date: 02/12/2017    History of Present Illness 51 y.o. male s/p L TKA 02/10/17. PMH includes: Acute pulmonary embolus, DVT, Knee surgery    PT Comments    Session focused on gait and stair training. Pt ambulating with improved gait mechanics and more independence from prior session. Ascended/descended 12 stairs with supervision and BUE support with safe dynamic balance. Pt educated on safety considerations for home and have no further questions or concerns at this time. Pt has met all functional goals and will benefit from skilled home health PT when medically cleared for d/c.     Follow Up Recommendations  Home health PT;DC plan and follow up therapy as arranged by surgeon     Equipment Recommendations  Rolling walker with 5" wheels    Recommendations for Other Services       Precautions / Restrictions Precautions Precautions: Knee Precaution Booklet Issued: Yes (comment) Precaution Comments: reviewed knee precautions and DVT prevention  Restrictions Weight Bearing Restrictions: Yes LLE Weight Bearing: Weight bearing as tolerated    Mobility  Bed Mobility Overal bed mobility: Needs Assistance Bed Mobility: Supine to Sit;Sit to Supine     Supine to sit: Modified independent (Device/Increase time) Sit to supine: Modified independent (Device/Increase time)   General bed mobility comments: Able to supine<>sit without physical assistance, utilizies RLE to swing LLE in/out of bed; increased time and effort to bring LE back into bed when returning to supine   Transfers Overall transfer level: Needs assistance Equipment used: Rolling walker (2 wheeled) Transfers: Sit to/from Stand Sit to Stand: Supervision         General transfer comment: Supervision   Ambulation/Gait Ambulation/Gait assistance: Supervision Ambulation Distance (Feet): 300  Feet Assistive device: Rolling walker (2 wheeled) Gait Pattern/deviations: Step-through pattern Gait velocity: decreased   General Gait Details: Pt demonstrating step through pattern   Stairs Stairs: Yes   Stair Management: Two rails;Step to pattern Number of Stairs: 12 General stair comments: Safe with step to pattern to BUE support.   Wheelchair Mobility    Modified Rankin (Stroke Patients Only)       Balance Overall balance assessment: Needs assistance Sitting-balance support: Feet unsupported;No upper extremity supported Sitting balance-Leahy Scale: Normal     Standing balance support: No upper extremity supported Standing balance-Leahy Scale: Fair Standing balance comment: can tolerate standing balance without BUE support, requires UE support for dynamic balance due to post op pain and weakness                            Cognition Arousal/Alertness: Awake/alert Behavior During Therapy: WFL for tasks assessed/performed Overall Cognitive Status: Within Functional Limits for tasks assessed                                        Exercises Total Joint Exercises Ankle Circles/Pumps: AROM;Both;20 reps Quad Sets: AROM;10 reps;Left Knee Flexion: AROM;Left;10 reps    General Comments General comments (skin integrity, edema, etc.): VSS throughout session.       Pertinent Vitals/Pain Pain Assessment: 0-10 Pain Score: 4  Pain Location: L knee Pain Descriptors / Indicators: Operative site guarding;Grimacing;Guarding;Aching    Home Living Family/patient expects to be discharged to:: Private residence  Prior Function            PT Goals (current goals can now be found in the care plan section) Acute Rehab PT Goals PT Goal Formulation: With patient Time For Goal Achievement: 02/16/17 Potential to Achieve Goals: Good Progress towards PT goals: Goals met/education completed, patient discharged from PT     Frequency           PT Plan Current plan remains appropriate    Co-evaluation              AM-PAC PT "6 Clicks" Daily Activity  Outcome Measure  Difficulty turning over in bed (including adjusting bedclothes, sheets and blankets)?: A Little Difficulty moving from lying on back to sitting on the side of the bed? : A Little Difficulty sitting down on and standing up from a chair with arms (e.g., wheelchair, bedside commode, etc,.)?: A Little Help needed moving to and from a bed to chair (including a wheelchair)?: A Little Help needed walking in hospital room?: A Little Help needed climbing 3-5 steps with a railing? : A Little 6 Click Score: 18    End of Session Equipment Utilized During Treatment: Gait belt Activity Tolerance: Patient tolerated treatment well Patient left: in bed;with call bell/phone within reach Nurse Communication: Mobility status PT Visit Diagnosis: Unsteadiness on feet (R26.81);Other abnormalities of gait and mobility (R26.89);Pain;Muscle weakness (generalized) (M62.81) Pain - Right/Left: Left Pain - part of body: Knee     Time: 0928-1000 PT Time Calculation (min) (ACUTE ONLY): 32 min  Charges:  $Gait Training: 23-37 mins                    G Codes:      Reinaldo Berber, PT, DPT Acute Rehab Services Pager: [phone]     Reinaldo Berber 02/12/2017, 10:16 AM

## 2017-02-14 DIAGNOSIS — M1991 Primary osteoarthritis, unspecified site: Secondary | ICD-10-CM | POA: Diagnosis not present

## 2017-02-14 DIAGNOSIS — I82531 Chronic embolism and thrombosis of right popliteal vein: Secondary | ICD-10-CM | POA: Diagnosis not present

## 2017-02-14 DIAGNOSIS — I82591 Chronic embolism and thrombosis of other specified deep vein of right lower extremity: Secondary | ICD-10-CM | POA: Diagnosis not present

## 2017-02-14 DIAGNOSIS — I82541 Chronic embolism and thrombosis of right tibial vein: Secondary | ICD-10-CM | POA: Diagnosis not present

## 2017-02-14 DIAGNOSIS — I2699 Other pulmonary embolism without acute cor pulmonale: Secondary | ICD-10-CM | POA: Diagnosis not present

## 2017-02-14 DIAGNOSIS — Z471 Aftercare following joint replacement surgery: Secondary | ICD-10-CM | POA: Diagnosis not present

## 2017-02-14 DIAGNOSIS — F17210 Nicotine dependence, cigarettes, uncomplicated: Secondary | ICD-10-CM | POA: Diagnosis not present

## 2017-02-14 DIAGNOSIS — I82511 Chronic embolism and thrombosis of right femoral vein: Secondary | ICD-10-CM | POA: Diagnosis not present

## 2017-02-14 DIAGNOSIS — I10 Essential (primary) hypertension: Secondary | ICD-10-CM | POA: Diagnosis not present

## 2017-02-16 ENCOUNTER — Other Ambulatory Visit: Payer: Self-pay | Admitting: *Deleted

## 2017-02-16 NOTE — Patient Outreach (Signed)
[place] [facility]) Care Management  02/16/2017  Jeffrey [name] 1966-01-18 [id number]   Subjective: Telephone call to patient's home  number, no answer, left HIPAA compliant voicemail message, and requested call back.    Objective: Per KPN (Knowledge Performance Now, point of care tool) and chart review, patient hospitalized 02/10/17 -02/12/17 for Left knee osteoarthritis.    Status post  Left total knee arthroplasty on 02/10/17.  Patient hospitalized 06/03/16 - 06/05/16 for bilateral pulmonary embolism. Patient also has a history of hypertension and tobacco use. [facility] Care Management transition of care follow up completed on 06/09/16.     Assessment: Received [facility]MR Transition of care referral on 02/10/17. Transition of care follow up pending patient contact.     Plan: RNCM will call patient for 2nd telephone outreach attempt, transition of care follow up, within 10 business days if no return call.     Jeffrey [name] H. Annia Friendly, BSN, [place] Management [facility] Telephonic CM Phone: [phone] Fax: [phone]

## 2017-02-17 DIAGNOSIS — F17210 Nicotine dependence, cigarettes, uncomplicated: Secondary | ICD-10-CM | POA: Diagnosis not present

## 2017-02-17 DIAGNOSIS — Z471 Aftercare following joint replacement surgery: Secondary | ICD-10-CM | POA: Diagnosis not present

## 2017-02-17 DIAGNOSIS — M1991 Primary osteoarthritis, unspecified site: Secondary | ICD-10-CM | POA: Diagnosis not present

## 2017-02-17 DIAGNOSIS — I82531 Chronic embolism and thrombosis of right popliteal vein: Secondary | ICD-10-CM | POA: Diagnosis not present

## 2017-02-17 DIAGNOSIS — I10 Essential (primary) hypertension: Secondary | ICD-10-CM | POA: Diagnosis not present

## 2017-02-17 DIAGNOSIS — I82511 Chronic embolism and thrombosis of right femoral vein: Secondary | ICD-10-CM | POA: Diagnosis not present

## 2017-02-17 DIAGNOSIS — I82591 Chronic embolism and thrombosis of other specified deep vein of right lower extremity: Secondary | ICD-10-CM | POA: Diagnosis not present

## 2017-02-17 DIAGNOSIS — I82541 Chronic embolism and thrombosis of right tibial vein: Secondary | ICD-10-CM | POA: Diagnosis not present

## 2017-02-17 DIAGNOSIS — I2699 Other pulmonary embolism without acute cor pulmonale: Secondary | ICD-10-CM | POA: Diagnosis not present

## 2017-02-19 DIAGNOSIS — Z471 Aftercare following joint replacement surgery: Secondary | ICD-10-CM | POA: Diagnosis not present

## 2017-02-19 DIAGNOSIS — M1991 Primary osteoarthritis, unspecified site: Secondary | ICD-10-CM | POA: Diagnosis not present

## 2017-02-19 DIAGNOSIS — I82591 Chronic embolism and thrombosis of other specified deep vein of right lower extremity: Secondary | ICD-10-CM | POA: Diagnosis not present

## 2017-02-19 DIAGNOSIS — I2699 Other pulmonary embolism without acute cor pulmonale: Secondary | ICD-10-CM | POA: Diagnosis not present

## 2017-02-19 DIAGNOSIS — F17210 Nicotine dependence, cigarettes, uncomplicated: Secondary | ICD-10-CM | POA: Diagnosis not present

## 2017-02-19 DIAGNOSIS — I10 Essential (primary) hypertension: Secondary | ICD-10-CM | POA: Diagnosis not present

## 2017-02-19 DIAGNOSIS — I82511 Chronic embolism and thrombosis of right femoral vein: Secondary | ICD-10-CM | POA: Diagnosis not present

## 2017-02-19 DIAGNOSIS — I82541 Chronic embolism and thrombosis of right tibial vein: Secondary | ICD-10-CM | POA: Diagnosis not present

## 2017-02-19 DIAGNOSIS — I82531 Chronic embolism and thrombosis of right popliteal vein: Secondary | ICD-10-CM | POA: Diagnosis not present

## 2017-02-22 ENCOUNTER — Other Ambulatory Visit: Payer: Self-pay | Admitting: *Deleted

## 2017-02-22 ENCOUNTER — Ambulatory Visit: Payer: Self-pay | Admitting: *Deleted

## 2017-02-22 NOTE — Patient Outreach (Signed)
[place] [facility]) Care Management  02/22/2017  JAQUESE IRVING April 27, 1965 [id number]   Subjective: Telephone call to patient's home number, no answer, left HIPAA compliant voicemail message, and requested call back.   Objective: Per KPN (Knowledge Performance Now, point of care tool) and chart review, patient hospitalized 02/10/17 -02/12/17 for Left knee osteoarthritis.    Status post  Left total knee arthroplasty on 02/10/17.  Patient hospitalized 06/03/16 - 06/05/16 for bilateral pulmonary embolism. Patient also has a history of hypertension and tobacco use. [facility] Care Management transition of care follow up completed on 06/09/16.     Assessment: Received UMR Transition of care referral on 02/10/17. Transition of care follow up pending patient contact.     Plan: RNCM will call patient for 3rd telephone outreach attempt, transition of care follow up, within 10 business days if no return call.     Katisha Shimizu H. Annia Friendly, BSN, [place] Management [facility] Telephonic CM Phone: [phone] Fax: [phone]

## 2017-02-23 ENCOUNTER — Ambulatory Visit (INDEPENDENT_AMBULATORY_CARE_PROVIDER_SITE_OTHER): Payer: 59 | Admitting: Orthopaedic Surgery

## 2017-02-23 ENCOUNTER — Ambulatory Visit: Payer: Self-pay | Admitting: *Deleted

## 2017-02-23 ENCOUNTER — Other Ambulatory Visit: Payer: Self-pay | Admitting: *Deleted

## 2017-02-23 ENCOUNTER — Encounter (INDEPENDENT_AMBULATORY_CARE_PROVIDER_SITE_OTHER): Payer: Self-pay | Admitting: Orthopaedic Surgery

## 2017-02-23 ENCOUNTER — Encounter: Payer: Self-pay | Admitting: *Deleted

## 2017-02-23 DIAGNOSIS — M1712 Unilateral primary osteoarthritis, left knee: Secondary | ICD-10-CM

## 2017-02-23 MED ORDER — TIZANIDINE HCL 4 MG PO TABS: 4.0000 mg | ORAL_TABLET | Freq: Four times a day (QID) | ORAL | 2 refills | Status: DC | PRN

## 2017-02-23 MED ORDER — OXYCODONE-ACETAMINOPHEN 5-325 MG PO TABS: 1.0000 | ORAL_TABLET | Freq: Three times a day (TID) | ORAL | 0 refills | Status: DC | PRN

## 2017-02-23 MED FILL — tiZANidine HCL 4 MG TABS: 4 | 7 days supply | Qty: 30 | Fill #0

## 2017-02-23 MED FILL — traMADol HCL 50 MG TABS: 50 | 30 days supply | Qty: 90 | Fill #1

## 2017-02-23 MED FILL — OXYCOD/ACETAMINOPHEN 5-325M: 5-325 | 8 days supply | Qty: 50 | Fill #0

## 2017-02-23 NOTE — Patient Outreach (Addendum)
[place] [facility]) Care Management  02/23/2017  Jeffrey [name] May 19, 1965 [id number]   Subjective: Telephone call to patient's mobile number, spoke with patient, and HIPAA verified.  Discussed [facility] Care Management UMR Transition of care follow up, patient voiced understanding, and is in agreement to follow up.  Patient states he is getting better each day, things going well, has assistance with activities of daily living / home management as needed, has a follow up appointment with surgeon today at 10:00am, and able to self manage.  States he is receiving home health physical therapy and it is going well.  Patient voices understanding of medical diagnosis, surgery, and treatment plan. States he is accessing the following [facility] benefits: outpatient pharmacy, hospital indemnity (not chosen benefit), and wife who is [facility] employee has family medical leave act [employer]) in place. Discussed benefits may vary with using [facility] versus non [facility] facility for outpatient therapy, patient voices understanding, and states he will follow up access appropriate benefit. Patient states he does not have any education material, transition of care, care coordination, disease management, disease monitoring, transportation, community resource, or pharmacy needs at this time.  States he is very appreciative of the follow up and is in agreement to receive [place] Management information.     Objective:Per KPN (Knowledge Performance Now, point of care tool) and chart review,patient hospitalized 02/10/17 -02/12/17 forLeft knee osteoarthritis. Status post Left total knee arthroplastyon 02/10/17. Patient hospitalized 06/03/16 - 06/05/16 for bilateral pulmonary embolism. Patient also has a history of hypertension and tobacco use. [facility] Care Management transition of care follow up completed on 06/09/16.   Assessment: Received UMR Transition of care referral on11/14/18. Transition of care follow up completed, no  care management needs, and will proceed with case closure.     Plan:RNCM will send patient successful outreach letter, [facility] pamphlet, and magnet. RNCM will send case closure due to follow up completed / no care management needs request to [facility] [place] Management.    Langford Carias H. Annia Friendly, BSN, [place] Management [facility] Telephonic CM Phone: [phone] Fax: [phone]

## 2017-02-23 NOTE — Progress Notes (Signed)
Patient is two-week status post left total knee replacement.  He is doing well.  He is taking Eliquis.  He has 3 more sessions of home physical therapy.  His pain is controlled with Percocet.  Surgical incision is healed without signs of infection.  Range of motion is improving.  Expected moderate swelling.  Percocet was refilled today.  Referral to outpatient physical therapy.  Continue Eliquis that he takes at baseline.  Follow-up in 4 weeks with 2 view x-rays of the left knee.

## 2017-02-24 DIAGNOSIS — I10 Essential (primary) hypertension: Secondary | ICD-10-CM | POA: Diagnosis not present

## 2017-02-24 DIAGNOSIS — I2699 Other pulmonary embolism without acute cor pulmonale: Secondary | ICD-10-CM | POA: Diagnosis not present

## 2017-02-24 DIAGNOSIS — M1991 Primary osteoarthritis, unspecified site: Secondary | ICD-10-CM | POA: Diagnosis not present

## 2017-02-24 DIAGNOSIS — F17210 Nicotine dependence, cigarettes, uncomplicated: Secondary | ICD-10-CM | POA: Diagnosis not present

## 2017-02-24 DIAGNOSIS — I82511 Chronic embolism and thrombosis of right femoral vein: Secondary | ICD-10-CM | POA: Diagnosis not present

## 2017-02-24 DIAGNOSIS — I82531 Chronic embolism and thrombosis of right popliteal vein: Secondary | ICD-10-CM | POA: Diagnosis not present

## 2017-02-24 DIAGNOSIS — Z471 Aftercare following joint replacement surgery: Secondary | ICD-10-CM | POA: Diagnosis not present

## 2017-02-24 DIAGNOSIS — I82541 Chronic embolism and thrombosis of right tibial vein: Secondary | ICD-10-CM | POA: Diagnosis not present

## 2017-02-24 DIAGNOSIS — I82591 Chronic embolism and thrombosis of other specified deep vein of right lower extremity: Secondary | ICD-10-CM | POA: Diagnosis not present

## 2017-02-25 DIAGNOSIS — M1991 Primary osteoarthritis, unspecified site: Secondary | ICD-10-CM | POA: Diagnosis not present

## 2017-02-25 DIAGNOSIS — I10 Essential (primary) hypertension: Secondary | ICD-10-CM | POA: Diagnosis not present

## 2017-02-25 DIAGNOSIS — Z471 Aftercare following joint replacement surgery: Secondary | ICD-10-CM | POA: Diagnosis not present

## 2017-02-25 DIAGNOSIS — I82511 Chronic embolism and thrombosis of right femoral vein: Secondary | ICD-10-CM | POA: Diagnosis not present

## 2017-02-25 DIAGNOSIS — F17210 Nicotine dependence, cigarettes, uncomplicated: Secondary | ICD-10-CM | POA: Diagnosis not present

## 2017-02-25 DIAGNOSIS — I82531 Chronic embolism and thrombosis of right popliteal vein: Secondary | ICD-10-CM | POA: Diagnosis not present

## 2017-02-25 DIAGNOSIS — I82591 Chronic embolism and thrombosis of other specified deep vein of right lower extremity: Secondary | ICD-10-CM | POA: Diagnosis not present

## 2017-02-25 DIAGNOSIS — I2699 Other pulmonary embolism without acute cor pulmonale: Secondary | ICD-10-CM | POA: Diagnosis not present

## 2017-02-25 DIAGNOSIS — I82541 Chronic embolism and thrombosis of right tibial vein: Secondary | ICD-10-CM | POA: Diagnosis not present

## 2017-02-26 DIAGNOSIS — I82591 Chronic embolism and thrombosis of other specified deep vein of right lower extremity: Secondary | ICD-10-CM | POA: Diagnosis not present

## 2017-02-26 DIAGNOSIS — F17210 Nicotine dependence, cigarettes, uncomplicated: Secondary | ICD-10-CM | POA: Diagnosis not present

## 2017-02-26 DIAGNOSIS — M1991 Primary osteoarthritis, unspecified site: Secondary | ICD-10-CM | POA: Diagnosis not present

## 2017-02-26 DIAGNOSIS — I2699 Other pulmonary embolism without acute cor pulmonale: Secondary | ICD-10-CM | POA: Diagnosis not present

## 2017-02-26 DIAGNOSIS — I82541 Chronic embolism and thrombosis of right tibial vein: Secondary | ICD-10-CM | POA: Diagnosis not present

## 2017-02-26 DIAGNOSIS — I82511 Chronic embolism and thrombosis of right femoral vein: Secondary | ICD-10-CM | POA: Diagnosis not present

## 2017-02-26 DIAGNOSIS — Z471 Aftercare following joint replacement surgery: Secondary | ICD-10-CM | POA: Diagnosis not present

## 2017-02-26 DIAGNOSIS — I82531 Chronic embolism and thrombosis of right popliteal vein: Secondary | ICD-10-CM | POA: Diagnosis not present

## 2017-02-26 DIAGNOSIS — I10 Essential (primary) hypertension: Secondary | ICD-10-CM | POA: Diagnosis not present

## 2017-03-02 ENCOUNTER — Encounter: Payer: Self-pay | Admitting: Physical Therapy

## 2017-03-02 ENCOUNTER — Ambulatory Visit: Payer: 59 | Attending: Orthopaedic Surgery | Admitting: Physical Therapy

## 2017-03-02 DIAGNOSIS — M25562 Pain in left knee: Secondary | ICD-10-CM | POA: Diagnosis not present

## 2017-03-02 DIAGNOSIS — M6281 Muscle weakness (generalized): Secondary | ICD-10-CM | POA: Insufficient documentation

## 2017-03-02 DIAGNOSIS — R600 Localized edema: Secondary | ICD-10-CM | POA: Insufficient documentation

## 2017-03-02 DIAGNOSIS — Z96652 Presence of left artificial knee joint: Secondary | ICD-10-CM | POA: Insufficient documentation

## 2017-03-02 DIAGNOSIS — M25662 Stiffness of left knee, not elsewhere classified: Secondary | ICD-10-CM | POA: Insufficient documentation

## 2017-03-02 NOTE — Therapy (Signed)
[place], [place], [address] Phone: [phone]   Fax:  [phone]  Physical Therapy Evaluation  Patient Details  Name: Jeffrey [name] MRN: [id number] Date of Birth: 1965-08-04 Referring Provider: Dr. Erlinda Hong    Encounter Date: 03/02/2017  PT End of Session - 03/02/17 1351    Visit Number  1    Number of Visits  24    Date for PT Re-Evaluation  04/27/17    PT Start Time  4742    PT Stop Time  1108    PT Time Calculation (min)  53 min    Activity Tolerance  Patient tolerated treatment well    Behavior During Therapy  [facility] for tasks assessed/performed       Past Medical History:  Diagnosis Date  . Acute pulmonary embolus (HCC)    bilateral submassive PE in setting of missing several doses of Xarelto for his DVT  . Arthritis   . DVT (deep venous thrombosis) ([place]) 11/28/2013   RT LEG  . High cholesterol   . Hypertension   . Marijuana use     Past Surgical History:  Procedure Laterality Date  . HERNIA REPAIR     5956 and 3875; umbilical hernia repair  . KNEE SURGERY Bilateral    Left knee 1993, right knee 2004  . TONSILLECTOMY    . TOTAL KNEE ARTHROPLASTY Left 02/10/2017   Procedure: LEFT TOTAL KNEE ARTHROPLASTY;  Surgeon: Leandrew Koyanagi, MD;  Location: [facility];  Service: Orthopedics;  Laterality: Left;    There were no vitals filed for this visit.   Subjective Assessment - 03/02/17 1023    Subjective  Pt underwent Lt TKA on 11/14.  He had several HHPt sessions.  He walks with a Rolling Walker, has begun to try the cane in his home.  He has difficulty bending his L knee, walking , negotiating stairs and standing periods over 15 min.   He has LE edema which intereferes with ROM.  He needs to have his Rt. knee replaced as well.     Patient is accompained by:  Family member    Limitations  Sitting;Standing;Walking;Lifting;House hold activities;Other (comment) sit to stand, sleeping wakes frequently    How long can you stand comfortably?  15 min    How long can you walk comfortably?  10-15 min     Diagnostic tests  XR prior to surgery had several loose bodies    Patient Stated Goals  Pt would like to be pain free and get ready for Rt. knee surgery.     Currently in Pain?  Yes    Pain Score  5     Pain Location  Knee    Pain Orientation  Left;Anterior;Lateral    Pain Descriptors / Indicators  Sore;Aching;Tightness    Pain Type  Surgical pain    Pain Onset  1 to 4 weeks ago    Pain Frequency  Intermittent    Aggravating Factors   overactivity, standing     Pain Relieving Factors  ice, meds, prop it, self massage     Effect of Pain on Daily Activities  painful to walk, move          [facility] PT Assessment - 03/02/17 0001      Assessment   Medical Diagnosis  L TKA     Referring Provider  Dr. Erlinda Hong     Onset Date/Surgical Date  02/10/17    Next MD Visit  3 weeks  Prior Therapy  HHPT       Precautions   Precautions  None    Precaution Comments  knee protocol TKA      Restrictions   Weight Bearing Restrictions  No      Balance Screen   Has the patient fallen in the past 6 months  Yes due to Rt. knee     How many times?  1    Has the patient had a decrease in activity level because of a fear of falling?   Yes    Is the patient reluctant to leave their home because of a fear of falling?   Yes      [place]  Private residence    Living Arrangements  Spouse/significant other;Children    Available Help at Discharge  Family    Type of [place] to enter    Entrance Stairs-Number of Steps  3    [place]  One level    [place] - 2 wheels;[place] - single point      Prior Function   Level of Independence  Independent with household mobility with device;Independent with community mobility with device    Vocation  Unemployed    Vocation Requirements  used to work in Press photographer,  Architect     Leisure  TV, scrabble       Cognition   Overall Cognitive Status  Within Functional Limits for tasks assessed      Observation/Other Assessments   Focus on Therapeutic Outcomes (FOTO)   74%      Circumferential Edema   Circumferential - Right  17 1/4 inch     Circumferential - Left   18.5 inch at patella      Sensation   Light Touch  Appears Intact    Additional Comments  scar is hypersensitive at time       Posture/Postural Control   Posture/Postural Control  Postural limitations    Postural Limitations  Rounded Shoulders;Forward head;Flexed trunk    Posture Comments  decr WB on LLE      AROM   Right Knee Extension  4    Right Knee Flexion  110    Left Knee Extension  13    Left Knee Flexion  75 82 deg with AAROM , sheet       PROM   Overall PROM Comments  85 knee flexion L       Strength   Right/Left Hip  -- hip ext appears grossly 3/5 to 3+/5     Right Hip Flexion  5/5    Left Hip Flexion  3-/5    Left Hip ABduction  3+/5    Right Knee Flexion  5/5    Right Knee Extension  5/5    Left Knee Flexion  3-/5    Left Knee Extension  4/5      Palpation   Patella mobility  very tight    Palpation comment  tender along incision and pain medial and lateral joint line , skin tight, edema       Bed Mobility   Bed Mobility  -- mod I       Transfers   Comments  mod I all transfers       Ambulation/Gait   Ambulation Distance (Feet)  150 Feet    Assistive device  Rolling walker    Gait  Pattern  Step-to pattern;Decreased step length - left;Decreased stance time - left;Decreased hip/knee flexion - left;Decreased dorsiflexion - left;Left circumduction;Antalgic;Trunk flexed;Wide base of support;Abducted - left             Objective measurements completed on examination: See above findings.      [facility] Adult PT Treatment/Exercise - 03/02/17 0001      Self-Care   Self-Care  Retrograde Massage;Heat/Ice Application;Other Self-Care Comments     Retrograde Massage  RICE     Other Self-Care Comments   HEP, tape       Knee/Hip Exercises: Stretches   Knee: Self-Stretch to increase Flexion  Left;10 seconds    Knee: Self-Stretch Limitations  x 10 reps       Cryotherapy   Number Minutes Cryotherapy  10 Minutes    Cryotherapy Location  Knee    Type of Cryotherapy  Ice pack      Manual Therapy   Kinesiotex  Edema      Kinesiotix   Edema  3 fans for edema              PT Education - 03/02/17 1350    Education provided  Yes    Education Details  PT/POC, HEP for HHPT, cont to stretch, propping LE, edema    Person(s) Educated  Patient    Methods  Explanation;Demonstration    Comprehension  Verbalized understanding;Returned demonstration;Verbal cues required       PT Short Term Goals - 03/02/17 1356      PT SHORT TERM GOAL #1   Title  Pt will be able to sit with knee flexion >/= 90 deg for normal tranfers and ROM .     Time  4    Period  Weeks    Status  New    Target Date  03/30/17      PT SHORT TERM GOAL #2   Title  Pt will be able to walk with cane and improved, more natural motion on L knee, min noticeable limp.     Time  4    Period  Weeks    Status  New    Target Date  03/30/17      PT SHORT TERM GOAL #3   Title  Pt will be demo <10 deg quad lag in supine to demo improved quad strength.     Time  4    Period  Weeks    Status  New    Target Date  03/30/17      PT SHORT TERM GOAL #4   Title  Pt will be I with HEP and report consistent stretching for max results.     Time  4    Period  Weeks    Status  New    Target Date  03/30/17        PT Long Term Goals - 03/02/17 1400      PT LONG TERM GOAL #1   Title  Pt will score <50% on FOTO to demo improvement in overall functional mobility.     Time  8    Period  Weeks    Status  New    Target Date  04/27/17      PT LONG TERM GOAL #2   Title  Pt will demo strength in L knee 5/5 and hip at least 4+/5 in extension and abduction for normal gait.      Time  8    Period  Weeks    Status  New    Target Date  04/27/17      PT LONG TERM GOAL #3   Title  Pt will be able to flex L knee to 110 deg or more for maximal function and preparation for Rt. TKA.     Time  8    Period  Weeks    Status  New    Target Date  04/27/17      PT LONG TERM GOAL #4   Title  Pt will be able to walk with min limp and have no increase in pain up to 45 min to 1 hour, cane if needed.     Time  8    Period  Weeks    Status  New    Target Date  04/27/17             Plan - 03/02/17 1416    Clinical Impression Statement  Patient presents for low complexity eval of L knee s/p total knee arthroplasty on 02/10/17.  He has significant limitations in gait, ROM, strength and swelling in LLE .  His AROM today was 0-13-75, increased with AAROM, pain severe.  Will start with 3 times per week due to deficits.     Clinical Presentation  Stable    Clinical Decision Making  Low    Rehab Potential  Good    PT Frequency  3x / week    PT Duration  8 weeks    PT Treatment/Interventions  ADLs/Self Care Home Management;Cryotherapy;Ultrasound;Gait training;Stair training;Balance training;Manual lymph drainage;Dry needling;Passive range of motion;Manual techniques;Patient/family education;Therapeutic exercise;DME Instruction;Electrical Stimulation;Functional mobility training;Therapeutic activities;Neuromuscular re-education;Taping;Vasopneumatic Device    PT Next Visit Plan  vaso, tape, HEP and progress ROM , quad strength     PT Home Exercise Plan  has HHPT HEP and asked to bring in      Consulted and Agree with Plan of Care  Patient       Patient will benefit from skilled therapeutic intervention in order to improve the following deficits and impairments:  Decreased mobility, Abnormal gait, Difficulty walking, Hypomobility, Obesity, Improper body mechanics, Increased edema, Decreased range of motion, Decreased activity tolerance, Decreased strength, Increased fascial  restricitons, Impaired flexibility, Postural dysfunction, Pain, Decreased skin integrity, Decreased scar mobility, Decreased balance, Decreased knowledge of use of DME  Visit Diagnosis: Stiffness of left knee, not elsewhere classified  Localized edema  Acute pain of left knee  Muscle weakness (generalized)  Total knee replacement status, left     Problem List Patient Active Problem List   Diagnosis Date Noted  . Total knee replacement status 02/10/2017  . Chronic anticoagulation 01/26/2017  . Acute pulmonary embolus ([place])   . Primary osteoarthritis of left knee 07/28/2016  . Chest pain   . Abnormal EKG   . Pulmonary embolism ([place]) 06/03/2016  . Degenerative arthritis of right knee 08/08/2015  . Encounter for chronic pain management 04/06/2014  . Tobacco abuse 12/05/2013  . DVT (deep venous thrombosis) ([place]) 11/28/2013  . Candidate for statin therapy due to risk of future cardiovascular event 05/05/2013  . HTN (hypertension) 05/04/2013  . Bilateral knee pain 05/04/2013  . Lumbar back pain 05/04/2013  . Healthcare maintenance 05/04/2013  . BMI 33.0-33.9,adult 05/04/2013  . Hx of substance abuse 05/04/2013    PAA,JENNIFER 03/02/2017, 4:16 PM  [facility] [address] [place], [place], [address] Phone: [phone]   Fax:  [phone]  Name: LEMOND GRIFFEE MRN: [id number] Date of Birth: 12-20-1965   Raeford Razor,  PT 03/02/17 4:16 PM Phone: [phone] Fax: [phone]

## 2017-03-03 ENCOUNTER — Encounter: Payer: Self-pay | Admitting: Physical Therapy

## 2017-03-03 ENCOUNTER — Ambulatory Visit: Payer: 59 | Admitting: Physical Therapy

## 2017-03-03 DIAGNOSIS — M6281 Muscle weakness (generalized): Secondary | ICD-10-CM

## 2017-03-03 DIAGNOSIS — M25662 Stiffness of left knee, not elsewhere classified: Secondary | ICD-10-CM | POA: Diagnosis not present

## 2017-03-03 DIAGNOSIS — R600 Localized edema: Secondary | ICD-10-CM | POA: Diagnosis not present

## 2017-03-03 DIAGNOSIS — Z96652 Presence of left artificial knee joint: Secondary | ICD-10-CM | POA: Diagnosis not present

## 2017-03-03 DIAGNOSIS — M25562 Pain in left knee: Secondary | ICD-10-CM | POA: Diagnosis not present

## 2017-03-03 NOTE — Patient Instructions (Signed)
Slide arms up wall for ant stretch and LLE involuntary contraction. HEP  Use cane when able for short distances, bring to PT

## 2017-03-03 NOTE — Therapy (Signed)
[place], [place], [address] Phone: [phone]   Fax:  [phone]  Physical Therapy Treatment  Patient Details  Name: Jeffrey [name] MRN: [id number] Date of Birth: 03/08/1966 Referring Provider: Dr. Erlinda Hong    Encounter Date: 03/03/2017  PT End of Session - 03/03/17 1520    Visit Number  2    Number of Visits  2    Date for PT Re-Evaluation  04/27/17    PT Start Time  1430    PT Stop Time  1528    PT Time Calculation (min)  58 min    Equipment Utilized During Treatment  Gait belt    Activity Tolerance  Patient tolerated treatment well    Behavior During Therapy  [facility] for tasks assessed/performed       Past Medical History:  Diagnosis Date  . Acute pulmonary embolus (HCC)    bilateral submassive PE in setting of missing several doses of Xarelto for his DVT  . Arthritis   . DVT (deep venous thrombosis) ([place]) 11/28/2013   RT LEG  . High cholesterol   . Hypertension   . Marijuana use     Past Surgical History:  Procedure Laterality Date  . HERNIA REPAIR     2947 and 6546; umbilical hernia repair  . KNEE SURGERY Bilateral    Left knee 1993, right knee 2004  . TONSILLECTOMY    . TOTAL KNEE ARTHROPLASTY Left 02/10/2017   Procedure: LEFT TOTAL KNEE ARTHROPLASTY;  Surgeon: Leandrew Koyanagi, MD;  Location: [facility];  Service: Orthopedics;  Laterality: Left;    There were no vitals filed for this visit.  Subjective Assessment - 03/03/17 1436    Subjective  4/10 upon entering clinic.  Brought in HEP from [place] , has alot of exercises and he reports doing them daily.  Liked the tape.     Currently in Pain?  Yes    Pain Score  4                [facility] Adult PT Treatment/Exercise - 03/03/17 0001      Ambulation/Gait   Pre-Gait Activities  gait with min to no UE assist, retro , heel and toe walking      Knee/Hip Exercises: Stretches   Hip Flexor Stretch  Both;3 reps    Hip Flexor Stretch  Limitations  standing in parallel bars     Knee: Self-Stretch to increase Flexion  Left;10 seconds    Knee: Self-Stretch Limitations  x 10 reps     Other Knee/Hip Stretches  standing wall stretch for Lt quad and glute activation     Other Knee/Hip Stretches  extensor stretch  knee propped       Knee/Hip Exercises: Aerobic   Nustep  UE and LE for AAROM L knee L2  , 5 min       Knee/Hip Exercises: Standing   Hip Abduction  Stengthening;Both;1 set;15 reps      Knee/Hip Exercises: Supine   Quad Sets  Strengthening;Both;1 set;20 reps    Short Arc Quad Sets  Strengthening;Left;1 set;20 reps    Patellar Mobs  gentle       Vasopneumatic   Number Minutes Vasopneumatic   15 minutes    Vasopnuematic Location   Knee    Vasopneumatic Pressure  Medium    Vasopneumatic Temperature   coldest      Kinesiotix   Edema  still applied  PT Education - 03/03/17 1521    Education provided  Yes    Education Details  focused HEP, gait with cane     Person(s) Educated  Patient    Methods  Explanation    Comprehension  Verbalized understanding       PT Short Term Goals - 03/02/17 1356      PT SHORT TERM GOAL #1   Title  Pt will be able to sit with knee flexion >/= 90 deg for normal tranfers and ROM .     Time  4    Period  Weeks    Status  New    Target Date  03/30/17      PT SHORT TERM GOAL #2   Title  Pt will be able to walk with cane and improved, more natural motion on L knee, min noticeable limp.     Time  4    Period  Weeks    Status  New    Target Date  03/30/17      PT SHORT TERM GOAL #3   Title  Pt will be demo <10 deg quad lag in supine to demo improved quad strength.     Time  4    Period  Weeks    Status  New    Target Date  03/30/17      PT SHORT TERM GOAL #4   Title  Pt will be I with HEP and report consistent stretching for max results.     Time  4    Period  Weeks    Status  New    Target Date  03/30/17        PT Long Term Goals - 03/02/17  1400      PT LONG TERM GOAL #1   Title  Pt will score <50% on FOTO to demo improvement in overall functional mobility.     Time  8    Period  Weeks    Status  New    Target Date  04/27/17      PT LONG TERM GOAL #2   Title  Pt will demo strength in L knee 5/5 and hip at least 4+/5 in extension and abduction for normal gait.     Time  8    Period  Weeks    Status  New    Target Date  04/27/17      PT LONG TERM GOAL #3   Title  Pt will be able to flex L knee to 110 deg or more for maximal function and preparation for Rt. TKA.     Time  8    Period  Weeks    Status  New    Target Date  04/27/17      PT LONG TERM GOAL #4   Title  Pt will be able to walk with min limp and have no increase in pain up to 45 min to 1 hour, cane if needed.     Time  8    Period  Weeks    Status  New    Target Date  04/27/17            Plan - 03/03/17 1515    Clinical Impression Statement  Pt worked really hard today, able to improve a more upright gait after session and using cane.  Flexed knee to 80-82 deg with assist.  Tissue surrounding L knee more pliable today.     PT Treatment/Interventions  ADLs/Self Care  Home Management;Cryotherapy;Ultrasound;Gait training;Stair training;Balance training;Manual lymph drainage;Dry needling;Passive range of motion;Manual techniques;Patient/family education;Therapeutic exercise;DME Instruction;Electrical Stimulation;Functional mobility training;Therapeutic activities;Neuromuscular re-education;Taping;Vasopneumatic Device    PT Next Visit Plan  vaso, tape, HEP and progress ROM , quad strength . Gait with cane     PT Home Exercise Plan  gave him wall slides for LLE activation, has and does level 1-2 knee and standing march, SLR, squat anf toe raises     Consulted and Agree with Plan of Care  Patient;Family member/caregiver    Family Member Consulted  spouse       Patient will benefit from skilled therapeutic intervention in order to improve the following  deficits and impairments:  Decreased mobility, Abnormal gait, Difficulty walking, Hypomobility, Obesity, Improper body mechanics, Increased edema, Decreased range of motion, Decreased activity tolerance, Decreased strength, Increased fascial restricitons, Impaired flexibility, Postural dysfunction, Pain, Decreased skin integrity, Decreased scar mobility, Decreased balance, Decreased knowledge of use of DME  Visit Diagnosis: Stiffness of left knee, not elsewhere classified  Localized edema  Acute pain of left knee  Muscle weakness (generalized)  Total knee replacement status, left     Problem List Patient Active Problem List   Diagnosis Date Noted  . Total knee replacement status 02/10/2017  . Chronic anticoagulation 01/26/2017  . Acute pulmonary embolus ([place])   . Primary osteoarthritis of left knee 07/28/2016  . Chest pain   . Abnormal EKG   . Pulmonary embolism ([place]) 06/03/2016  . Degenerative arthritis of right knee 08/08/2015  . Encounter for chronic pain management 04/06/2014  . Tobacco abuse 12/05/2013  . DVT (deep venous thrombosis) ([place]) 11/28/2013  . Candidate for statin therapy due to risk of future cardiovascular event 05/05/2013  . HTN (hypertension) 05/04/2013  . Bilateral knee pain 05/04/2013  . Lumbar back pain 05/04/2013  . Healthcare maintenance 05/04/2013  . BMI 33.0-33.9,adult 05/04/2013  . Hx of substance abuse 05/04/2013    Geramy Lamorte 03/03/2017, 3:23 PM  [facility] [address] [place], [place], [address] Phone: [phone]   Fax:  [phone]  Name: ABISAI DEER MRN: [id number] Date of Birth: 1965/12/09  Raeford Razor, PT 03/03/17 3:23 PM Phone: [phone] Fax: [phone]

## 2017-03-04 ENCOUNTER — Telehealth (INDEPENDENT_AMBULATORY_CARE_PROVIDER_SITE_OTHER): Payer: Self-pay | Admitting: Orthopaedic Surgery

## 2017-03-04 ENCOUNTER — Encounter (INDEPENDENT_AMBULATORY_CARE_PROVIDER_SITE_OTHER): Payer: Self-pay

## 2017-03-04 NOTE — Telephone Encounter (Signed)
That is fine.  Please include the date of surgery and 3 months of recovery and limited ability to travel.  No prolonged standing or walking or activity

## 2017-03-04 NOTE — Telephone Encounter (Signed)
Patient needs a more detailed letter for their lawyer. The note needs to include date of the surgery, recovery time, length of time out of work, restrictions, limited ability to travel?. Patient said wife will be here around 2 p.m. today to drop off papers for herself if the letter can be ready by then. CB # [phone]

## 2017-03-04 NOTE — Telephone Encounter (Signed)
Letter ready for pick up. Tried to call no answer LMOM with details.

## 2017-03-04 NOTE — Telephone Encounter (Signed)
Please advise on message. 

## 2017-03-04 NOTE — Telephone Encounter (Signed)
Patient called asked for a call back when letter in at the front desk. Patient advised his wife will pick up letter. The number to contact patient is [phone]

## 2017-03-09 ENCOUNTER — Encounter: Payer: 59 | Admitting: Physical Therapy

## 2017-03-11 ENCOUNTER — Ambulatory Visit: Payer: 59 | Admitting: Physical Therapy

## 2017-03-15 ENCOUNTER — Other Ambulatory Visit (INDEPENDENT_AMBULATORY_CARE_PROVIDER_SITE_OTHER): Payer: Self-pay

## 2017-03-15 ENCOUNTER — Telehealth (INDEPENDENT_AMBULATORY_CARE_PROVIDER_SITE_OTHER): Payer: Self-pay | Admitting: Orthopaedic Surgery

## 2017-03-15 MED ORDER — OXYCODONE-ACETAMINOPHEN 5-325 MG PO TABS: ORAL_TABLET | ORAL | 0 refills | Status: DC

## 2017-03-15 NOTE — Telephone Encounter (Signed)
Yes #30.  1-2 tabs twice daily as needed pain.

## 2017-03-15 NOTE — Telephone Encounter (Signed)
Patient wife came I looking for updated letter. Stated its been here over a week and s/b ready. Advised you were with a patient and I would have you contact them I regards to letter.  I got his RX off your desk and logged in book. Spouse picked it up.

## 2017-03-15 NOTE — Progress Notes (Signed)
PERC

## 2017-03-15 NOTE — Telephone Encounter (Signed)
RX PRINTED

## 2017-03-15 NOTE — Telephone Encounter (Signed)
Patient requests RX refill on Percocet. Can you call once ready for pick up because his wife will come. CB # [phone]

## 2017-03-15 NOTE — Telephone Encounter (Signed)
Please advise 

## 2017-03-16 ENCOUNTER — Encounter: Payer: Self-pay | Admitting: Physical Therapy

## 2017-03-16 ENCOUNTER — Ambulatory Visit: Payer: 59 | Admitting: Physical Therapy

## 2017-03-16 DIAGNOSIS — R600 Localized edema: Secondary | ICD-10-CM

## 2017-03-16 DIAGNOSIS — M25562 Pain in left knee: Secondary | ICD-10-CM | POA: Diagnosis not present

## 2017-03-16 DIAGNOSIS — M6281 Muscle weakness (generalized): Secondary | ICD-10-CM | POA: Diagnosis not present

## 2017-03-16 DIAGNOSIS — M25662 Stiffness of left knee, not elsewhere classified: Secondary | ICD-10-CM | POA: Diagnosis not present

## 2017-03-16 DIAGNOSIS — Z96652 Presence of left artificial knee joint: Secondary | ICD-10-CM

## 2017-03-16 MED FILL — OXYCOD/ACETAMINOPHEN 5-325M: 5-325 | 8 days supply | Qty: 30 | Fill #0

## 2017-03-16 NOTE — Therapy (Signed)
[place], [place], [address] Phone: [phone]   Fax:  [phone]  Physical Therapy Treatment  Patient Details  Name: Jeffrey [name] MRN: [id number] Date of Birth: [birth year] Referring Provider: Dr. Erlinda Hong    Encounter Date: 03/16/2017  PT End of Session - 03/16/17 1604    Visit Number  3    Number of Visits  24    Date for PT Re-Evaluation  04/27/17    PT Start Time  0300    PT Stop Time  0355    PT Time Calculation (min)  55 min       Past Medical History:  Diagnosis Date  . Acute pulmonary embolus (HCC)    bilateral submassive PE in setting of missing several doses of Xarelto for his DVT  . Arthritis   . DVT (deep venous thrombosis) ([facility]) 11/28/2013   RT LEG  . High cholesterol   . Hypertension   . Marijuana use     Past Surgical History:  Procedure Laterality Date  . HERNIA REPAIR     8270 and 7867; umbilical hernia repair  . KNEE SURGERY Bilateral    Left knee 1993, right knee 2004  . TONSILLECTOMY    . TOTAL KNEE ARTHROPLASTY Left 02/10/2017   Procedure: LEFT TOTAL KNEE ARTHROPLASTY;  Surgeon: Leandrew Koyanagi, MD;  Location: [facility];  Service: Orthopedics;  Laterality: Left;    There were no vitals filed for this visit.  Subjective Assessment - 03/16/17 1505    Subjective  6/10, I've been out of pain meds since Saturday. Having difficulty sleeping. Fell onto right knee on the front porch trying to clear the snow.     Currently in Pain?  Yes    Pain Score  6     Pain Location  Knee    Pain Orientation  Left    Pain Descriptors / Indicators  Tightness;Sore    Pain Type  Surgical pain    Aggravating Factors   sensitive to touch, sheet touching scar, prolonged time on feet, sitting to long     Pain Relieving Factors  ice, heat, meds, OTC med s         [facility] PT Assessment - 03/16/17 0001      AROM   Left Knee Extension  15    Left Knee Flexion  93                   [facility] Adult PT Treatment/Exercise - 03/16/17 0001      Knee/Hip Exercises: Stretches   Active Hamstring Stretch  3 reps;30 seconds long sitting    Knee: Self-Stretch to increase Flexion  Left;30 seconds;3 reps    Knee: Self-Stretch Limitations  scoot stretch EOM      Knee/Hip Exercises: Aerobic   Nustep  LE only L3 x 6 minutes       Knee/Hip Exercises: Supine   Short Arc Quad Sets  Strengthening;Left;1 set;20 reps    Terminal Knee Extension  10 reps heel prop and towel under knee     Straight Leg Raises  10 reps      Vasopneumatic   Number Minutes Vasopneumatic   15 minutes    Vasopnuematic Location   Knee    Vasopneumatic Pressure  Medium    Vasopneumatic Temperature   coldest       Manual Therapy   Manual therapy comments  desensitization to scar with towel  PT Short Term Goals - 03/16/17 1603      PT SHORT TERM GOAL #1   Title  Pt will be able to sit with knee flexion >/= 90 deg for normal tranfers and ROM .     Time  4    Period  Weeks    Status  Achieved      PT SHORT TERM GOAL #2   Title  Pt will be able to walk with cane and improved, more natural motion on L knee, min noticeable limp.     Time  4    Period  Weeks    Status  On-going      PT SHORT TERM GOAL #3   Title  Pt will be demo <10 deg quad lag in supine to demo improved quad strength.     Baseline  -15    Time  4    Period  Weeks    Status  On-going      PT SHORT TERM GOAL #4   Title  Pt will be I with HEP and report consistent stretching for max results.     Time  4    Period  Weeks    Status  On-going        PT Long Term Goals - 03/02/17 1400      PT LONG TERM GOAL #1   Title  Pt will score <50% on FOTO to demo improvement in overall functional mobility.     Time  8    Period  Weeks    Status  New    Target Date  04/27/17      PT LONG TERM GOAL #2   Title  Pt will demo strength in L knee 5/5 and hip at least 4+/5 in extension and  abduction for normal gait.     Time  8    Period  Weeks    Status  New    Target Date  04/27/17      PT LONG TERM GOAL #3   Title  Pt will be able to flex L knee to 110 deg or more for maximal function and preparation for Rt. TKA.     Time  8    Period  Weeks    Status  New    Target Date  04/27/17      PT LONG TERM GOAL #4   Title  Pt will be able to walk with min limp and have no increase in pain up to 45 min to 1 hour, cane if needed.     Time  8    Period  Weeks    Status  New    Target Date  04/27/17            Plan - 03/16/17 1524    Clinical Impression Statement  Continues with quad lag, full passive extension. Worked on quad activation with tactile and verbal cues required. Flexion ROM improved. He is able to sit on mat table with feet flat on floor. Instructed him in seated knee flexion sccot stretch for ROM. He measures 93 degrees flexion in hooklying. STG#1 met.     PT Next Visit Plan  vaso, tape, HEP and progress ROM , quad strength . Gait with cane     PT Home Exercise Plan  gave him wall slides for LLE activation, has and does level 1-2 knee and standing march, SLR, squat anf toe raises     Consulted and Agree with Plan of  Care  Patient;Family member/caregiver       Patient will benefit from skilled therapeutic intervention in order to improve the following deficits and impairments:  Decreased mobility, Abnormal gait, Difficulty walking, Hypomobility, Obesity, Improper body mechanics, Increased edema, Decreased range of motion, Decreased activity tolerance, Decreased strength, Increased fascial restricitons, Impaired flexibility, Postural dysfunction, Pain, Decreased skin integrity, Decreased scar mobility, Decreased balance, Decreased knowledge of use of DME  Visit Diagnosis: Stiffness of left knee, not elsewhere classified  Localized edema  Acute pain of left knee  Muscle weakness (generalized)  Total knee replacement status, left     Problem  List Patient Active Problem List   Diagnosis Date Noted  . Total knee replacement status 02/10/2017  . Chronic anticoagulation 01/26/2017  . Acute pulmonary embolus ([place])   . Primary osteoarthritis of left knee 07/28/2016  . Chest pain   . Abnormal EKG   . Pulmonary embolism ([place]) 06/03/2016  . Degenerative arthritis of right knee 08/08/2015  . Encounter for chronic pain management 04/06/2014  . Tobacco abuse 12/05/2013  . DVT (deep venous thrombosis) ([place]) 11/28/2013  . Candidate for statin therapy due to risk of future cardiovascular event 05/05/2013  . HTN (hypertension) 05/04/2013  . Bilateral knee pain 05/04/2013  . Lumbar back pain 05/04/2013  . Healthcare maintenance 05/04/2013  . BMI 33.0-33.9,adult 05/04/2013  . Hx of substance abuse 05/04/2013    Dorene Ar, [place] 03/16/2017, 4:06 PM  [facility] [address] [place], [place], [address] Phone: [phone]   Fax:  [phone]  Name: KIEGAN MACARAEG MRN: [id number] Date of Birth: 03/28/1966

## 2017-03-19 ENCOUNTER — Telehealth: Payer: Self-pay | Admitting: Physical Therapy

## 2017-03-19 ENCOUNTER — Ambulatory Visit: Payer: 59 | Admitting: Physical Therapy

## 2017-03-19 NOTE — Telephone Encounter (Signed)
Spoke to patient about missed appointment this morning. He forgot about the appointment and plans to continue with his next scheduled appointment on 12/27/208.

## 2017-03-25 ENCOUNTER — Ambulatory Visit (INDEPENDENT_AMBULATORY_CARE_PROVIDER_SITE_OTHER): Payer: 59 | Admitting: Orthopaedic Surgery

## 2017-03-25 ENCOUNTER — Ambulatory Visit (INDEPENDENT_AMBULATORY_CARE_PROVIDER_SITE_OTHER): Payer: 59

## 2017-03-25 ENCOUNTER — Encounter (INDEPENDENT_AMBULATORY_CARE_PROVIDER_SITE_OTHER): Payer: Self-pay | Admitting: Orthopaedic Surgery

## 2017-03-25 ENCOUNTER — Ambulatory Visit: Payer: 59 | Admitting: Physical Therapy

## 2017-03-25 DIAGNOSIS — M1712 Unilateral primary osteoarthritis, left knee: Secondary | ICD-10-CM | POA: Diagnosis not present

## 2017-03-25 MED ORDER — OXYCODONE-ACETAMINOPHEN 5-325 MG PO TABS: 1.0000 | ORAL_TABLET | Freq: Three times a day (TID) | ORAL | 0 refills | Status: DC | PRN

## 2017-03-25 MED ORDER — MUPIROCIN 2 % EX OINT: 1.0000 "application " | TOPICAL_OINTMENT | Freq: Two times a day (BID) | CUTANEOUS | 0 refills | Status: DC

## 2017-03-25 MED FILL — OXYCOD/ACETAMINOPHEN 5-325M: 5-325 | 10 days supply | Qty: 30 | Fill #0

## 2017-03-25 MED FILL — MUPIROCIN 2% OINTMENT: 2 | 10 days supply | Qty: 22 | Fill #0
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
## 2017-03-26 ENCOUNTER — Other Ambulatory Visit: Payer: Self-pay | Admitting: Internal Medicine

## 2017-03-26 MED FILL — ELIQUIS 5 MG TABLET: 5 | 30 days supply | Qty: 60 | Fill #2

## 2017-03-26 MED FILL — AMLODIPINE BESYLATE 10 MG T: 10 | 90 days supply | Qty: 90 | Fill #1

## 2017-03-26 MED FILL — tiZANidine HCL 4 MG TABS: 4 | 7 days supply | Qty: 30 | Fill #1

## 2017-03-29 MED FILL — ATORVASTATIN 40 MG TABLET: 40 | 30 days supply | Qty: 30 | Fill #0

## 2017-03-31 ENCOUNTER — Ambulatory Visit: Payer: 59 | Attending: Orthopaedic Surgery | Admitting: Physical Therapy

## 2017-03-31 ENCOUNTER — Encounter: Payer: Self-pay | Admitting: Physical Therapy

## 2017-03-31 DIAGNOSIS — R600 Localized edema: Secondary | ICD-10-CM | POA: Insufficient documentation

## 2017-03-31 DIAGNOSIS — M6281 Muscle weakness (generalized): Secondary | ICD-10-CM | POA: Insufficient documentation

## 2017-03-31 DIAGNOSIS — M25662 Stiffness of left knee, not elsewhere classified: Secondary | ICD-10-CM | POA: Diagnosis not present

## 2017-03-31 DIAGNOSIS — Z96652 Presence of left artificial knee joint: Secondary | ICD-10-CM | POA: Diagnosis not present

## 2017-03-31 DIAGNOSIS — M25562 Pain in left knee: Secondary | ICD-10-CM | POA: Diagnosis not present

## 2017-03-31 NOTE — Therapy (Signed)
[place], [place], [address] Phone: [phone]   Fax:  [phone]  Physical Therapy Treatment  Patient Details  Name: Jeffrey [name] MRN: [id number] Date of Birth: 22-Jun-1965 Referring Provider: Dr. Erlinda Hong    Encounter Date: 03/31/2017  PT End of Session - 03/31/17 1549    Visit Number  4    Number of Visits  24    Date for PT Re-Evaluation  04/27/17    PT Start Time  1504    PT Stop Time  1600    PT Time Calculation (min)  56 min    Activity Tolerance  Patient tolerated treatment well    Behavior During Therapy  [facility] for tasks assessed/performed       Past Medical History:  Diagnosis Date  . Acute pulmonary embolus (HCC)    bilateral submassive PE in setting of missing several doses of Xarelto for his DVT  . Arthritis   . DVT (deep venous thrombosis) ([place]) 11/28/2013   RT LEG  . High cholesterol   . Hypertension   . Marijuana use     Past Surgical History:  Procedure Laterality Date  . HERNIA REPAIR     5573 and 2202; umbilical hernia repair  . KNEE SURGERY Bilateral    Left knee 1993, right knee 2004  . TONSILLECTOMY    . TOTAL KNEE ARTHROPLASTY Left 02/10/2017   Procedure: LEFT TOTAL KNEE ARTHROPLASTY;  Surgeon: Leandrew Koyanagi, MD;  Location: [facility];  Service: Orthopedics;  Laterality: Left;    There were no vitals filed for this visit.  Subjective Assessment - 03/31/17 1512    Subjective  WAKE AN AVERage of 3  x a night.   Has a consistant regimin of HEP.  Uses cane 95% of the time.  Did not cane today have due to being late.    Swelling is getting better.     Currently in Pain?  Yes    Pain Score  7  Night pain 7/10.  Mild during day    Pain Orientation  Left    Pain Descriptors / Indicators  Tightness;Sore    Pain Frequency  Intermittent    Aggravating Factors   sitting longer.    Pain Relieving Factors  Keep moving,  meds,  Ice,  pillows     Effect of Pain on Daily Activities   limits standing,  sleeping sitting    Multiple Pain Sites  -- right knee 6/10.  Back pain long standing,   Cooking.  better sitting         [facility] PT Assessment - 03/31/17 0001      AROM   Left Knee Flexion  90      PROM   Overall PROM Comments  105 with strap mobs      Palpation   Patella mobility  moving                  [facility] Adult PT Treatment/Exercise - 03/31/17 0001      Knee/Hip Exercises: Stretches   Quad Stretch  10 seconds 10 X prone with strap    Gastroc Stretch  3 reps;30 seconds cued gentle stretch      Knee/Hip Exercises: Aerobic   Nustep  LE only L3 x 7 minutes  left      Knee/Hip Exercises: Standing   Heel Raises  10 reps up 2 down with left.    Other Standing Knee Exercises  tip toe heel  walking 20 feet.    Other Standing Knee Exercises  wall slide,  facing wall left forward,  difficult  10 X      Knee/Hip Exercises: Supine   Quad Sets  10 reps    Heel Slides  10 reps 7/10 pain    Patellar Mobs  checked,  moving today      Vasopneumatic   Number Minutes Vasopneumatic   15 minutes    Vasopnuematic Location   Knee    Vasopneumatic Pressure  Medium    Vasopneumatic Temperature   coldest       Manual Therapy   Manual Therapy  Joint mobilization    Joint Mobilization  with strap and movement 105 best             PT Education - 03/31/17 1705    Education provided  No       PT Short Term Goals - 03/31/17 1707      PT SHORT TERM GOAL #1   Title  Pt will be able to sit with knee flexion >/= 90 deg for normal tranfers and ROM .     Time  4    Period  Weeks      PT SHORT TERM GOAL #2   Title  Pt will be able to walk with cane and improved, more natural motion on L knee, min noticeable limp.     Baseline  forgot cane today    Time  4    Period  Weeks    Status  On-going      PT SHORT TERM GOAL #3   Title  Pt will be demo <10 deg quad lag in supine to demo improved quad strength.     Baseline  continues,  not measures    Time   4    Period  Weeks    Status  On-going      PT SHORT TERM GOAL #4   Title  Pt will be I with HEP and report consistent stretching for max results.     Baseline  consistant    Time  4    Period  Weeks    Status  On-going        PT Long Term Goals - 03/02/17 1400      PT LONG TERM GOAL #1   Title  Pt will score <50% on FOTO to demo improvement in overall functional mobility.     Time  8    Period  Weeks    Status  New    Target Date  04/27/17      PT LONG TERM GOAL #2   Title  Pt will demo strength in L knee 5/5 and hip at least 4+/5 in extension and abduction for normal gait.     Time  8    Period  Weeks    Status  New    Target Date  04/27/17      PT LONG TERM GOAL #3   Title  Pt will be able to flex L knee to 110 deg or more for maximal function and preparation for Rt. TKA.     Time  8    Period  Weeks    Status  New    Target Date  04/27/17      PT LONG TERM GOAL #4   Title  Pt will be able to walk with min limp and have no increase in pain up to 45 min to 1 hour, cane  if needed.     Time  8    Period  Weeks    Status  New    Target Date  04/27/17            Plan - 03/31/17 1549    Clinical Impression Statement  PROM 105.   AROM 90.  Pain 7/10 post session prior to vasopneumatic.  Antalgic gait continues today.  Patient demonstrates decreased weightbearing on leg.  Full PROM exetnsion.     PT Next Visit Plan  vaso, tape, HEP and progress ROM , quad strength . Gait with cane     PT Home Exercise Plan  gave him wall slides for LLE activation, has and does level 1-2 knee and standing march, SLR, squat anf toe raises     Consulted and Agree with Plan of Care  Patient       Patient will benefit from skilled therapeutic intervention in order to improve the following deficits and impairments:     Visit Diagnosis: Stiffness of left knee, not elsewhere classified  Localized edema  Acute pain of left knee  Muscle weakness (generalized)  Total knee  replacement status, left     Problem List Patient Active Problem List   Diagnosis Date Noted  . Total knee replacement status 02/10/2017  . Chronic anticoagulation 01/26/2017  . Acute pulmonary embolus ([place])   . Primary osteoarthritis of left knee 07/28/2016  . Chest pain   . Abnormal EKG   . Pulmonary embolism ([place]) 06/03/2016  . Degenerative arthritis of right knee 08/08/2015  . Encounter for chronic pain management 04/06/2014  . Tobacco abuse 12/05/2013  . DVT (deep venous thrombosis) ([place]) 11/28/2013  . Candidate for statin therapy due to risk of future cardiovascular event 05/05/2013  . HTN (hypertension) 05/04/2013  . Bilateral knee pain 05/04/2013  . Lumbar back pain 05/04/2013  . Healthcare maintenance 05/04/2013  . BMI 33.0-33.9,adult 05/04/2013  . Hx of substance abuse 05/04/2013    Noha Milberger  PTA 03/31/2017, 5:14 PM  [facility] [address] [place], [place], [address] Phone: [phone]   Fax:  [phone]  Name: Jeffrey [name] MRN: [id number] Date of Birth: [birth year]

## 2017-04-01 ENCOUNTER — Encounter: Payer: Self-pay | Admitting: Physical Therapy

## 2017-04-01 ENCOUNTER — Ambulatory Visit: Payer: 59 | Admitting: Physical Therapy

## 2017-04-01 DIAGNOSIS — R600 Localized edema: Secondary | ICD-10-CM

## 2017-04-01 DIAGNOSIS — Z96652 Presence of left artificial knee joint: Secondary | ICD-10-CM | POA: Diagnosis not present

## 2017-04-01 DIAGNOSIS — M6281 Muscle weakness (generalized): Secondary | ICD-10-CM

## 2017-04-01 DIAGNOSIS — M25562 Pain in left knee: Secondary | ICD-10-CM | POA: Diagnosis not present

## 2017-04-01 DIAGNOSIS — M25662 Stiffness of left knee, not elsewhere classified: Secondary | ICD-10-CM

## 2017-04-01 NOTE — Therapy (Signed)
[place], [place], [address] Phone: [phone]   Fax:  [phone]  Physical Therapy Treatment  Patient Details  Name: Jeffrey [name] MRN: [id number] Date of Birth: 12/19/1965 Referring Provider: Dr. Erlinda Hong    Encounter Date: 04/01/2017  PT End of Session - 04/01/17 1540    Visit Number  5    Number of Visits  24    Date for PT Re-Evaluation  04/27/17    PT Start Time  1500    PT Stop Time  1553    PT Time Calculation (min)  53 min    Activity Tolerance  Patient tolerated treatment well    Behavior During Therapy  [facility] for tasks assessed/performed       Past Medical History:  Diagnosis Date  . Acute pulmonary embolus (HCC)    bilateral submassive PE in setting of missing several doses of Xarelto for his DVT  . Arthritis   . DVT (deep venous thrombosis) ([place]) 11/28/2013   RT LEG  . High cholesterol   . Hypertension   . Marijuana use     Past Surgical History:  Procedure Laterality Date  . HERNIA REPAIR     0277 and 4128; umbilical hernia repair  . KNEE SURGERY Bilateral    Left knee 1993, right knee 2004  . TONSILLECTOMY    . TOTAL KNEE ARTHROPLASTY Left 02/10/2017   Procedure: LEFT TOTAL KNEE ARTHROPLASTY;  Surgeon: Leandrew Koyanagi, MD;  Location: [place];  Service: Orthopedics;  Laterality: Left;    There were no vitals filed for this visit.  Subjective Assessment - 04/01/17 1505    Subjective  5/10 discomfort.  Not taking pain meds anymore.  The ice machine helps me so much.     Currently in Pain?  Yes    Pain Score  5     Pain Location  Knee    Pain Orientation  Left    Pain Descriptors / Indicators  Discomfort    Pain Onset  More than a month ago    Pain Frequency  Intermittent        [facility] Adult PT Treatment/Exercise - 04/01/17 0001      Knee/Hip Exercises: Stretches   Active Hamstring Stretch  3 reps;30 seconds    Knee: Self-Stretch to increase Flexion  Left;3 reps;30 seconds     ITB Stretch  Left;2 reps    Gastroc Stretch  3 reps;30 seconds    Soleus Stretch  2 reps    Other Knee/Hip Stretches  hip stretching, for IR (knees wide with trunk rot)       Knee/Hip Exercises: Aerobic   Nustep  LE only L8 for 5 min       Knee/Hip Exercises: Machines for Strengthening   Cybex Leg Press  1 plate x 15, 2 plates x 15, cues for LE alignment       Knee/Hip Exercises: Supine   Short Arc Quad Sets  Strengthening;Left;1 set;20 reps 3 lbs     Bridges  Strengthening;Both;1 set;10 reps    Straight Leg Raises  Strengthening;Both;1 set;10 reps significant quad lag (20 deg?)     Other Supine Knee/Hip Exercises  posterior pelvic tilt x 10 mod cues       Vasopneumatic   Number Minutes Vasopneumatic   15 minutes    Vasopnuematic Location   Knee    Vasopneumatic Pressure  Medium    Vasopneumatic Temperature   coldest  PT Education - 04/01/17 1540    Education provided  Yes    Education Details  gait, compensations, ROM     Person(s) Educated  Patient    Methods  Explanation    Comprehension  Verbalized understanding       PT Short Term Goals - 04/01/17 1509      PT SHORT TERM GOAL #1   Title  Pt will be able to sit with knee flexion >/= 90 deg for normal tranfers and ROM .     Status  Achieved      PT SHORT TERM GOAL #2   Title  Pt will be able to walk with cane and improved, more natural motion on L knee, min noticeable limp.     Status  On-going      PT SHORT TERM GOAL #3   Title  Pt will be demo <10 deg quad lag in supine to demo improved quad strength.     Status  On-going      PT SHORT TERM GOAL #4   Title  Pt will be I with HEP and report consistent stretching for max results.     Status  Achieved        PT Long Term Goals - 03/02/17 1400      PT LONG TERM GOAL #1   Title  Pt will score <50% on FOTO to demo improvement in overall functional mobility.     Time  8    Period  Weeks    Status  New    Target Date  04/27/17      PT  LONG TERM GOAL #2   Title  Pt will demo strength in L knee 5/5 and hip at least 4+/5 in extension and abduction for normal gait.     Time  8    Period  Weeks    Status  New    Target Date  04/27/17      PT LONG TERM GOAL #3   Title  Pt will be able to flex L knee to 110 deg or more for maximal function and preparation for Rt. TKA.     Time  8    Period  Weeks    Status  New    Target Date  04/27/17      PT LONG TERM GOAL #4   Title  Pt will be able to walk with min limp and have no increase in pain up to 45 min to 1 hour, cane if needed.     Time  8    Period  Weeks    Status  New    Target Date  04/27/17            Plan - 04/01/17 1518    Clinical Impression Statement  Pt with compensatory gait pattern, introduced light hip stretching to ease back and hip pain. Works hard.  Quad lag present with SLR.      PT Treatment/Interventions  ADLs/Self Care Home Management;Cryotherapy;Ultrasound;Gait training;Stair training;Balance training;Manual lymph drainage;Dry needling;Passive range of motion;Manual techniques;Patient/family education;Therapeutic exercise;DME Instruction;Electrical Stimulation;Functional mobility training;Therapeutic activities;Neuromuscular re-education;Taping;Vasopneumatic Device    PT Next Visit Plan  vaso, tape, HEP and progress ROM , quad strength . Gait with cane     PT Home Exercise Plan  gave him wall slides for LLE activation, has and does level 1-2 knee and standing march, SLR, squat anf toe raises , hip abd and hip stretching     Consulted and Agree with Plan  of Care  Patient       Patient will benefit from skilled therapeutic intervention in order to improve the following deficits and impairments:  Decreased mobility, Abnormal gait, Difficulty walking, Hypomobility, Obesity, Improper body mechanics, Increased edema, Decreased range of motion, Decreased activity tolerance, Decreased strength, Increased fascial restricitons, Impaired flexibility, Postural  dysfunction, Pain, Decreased skin integrity, Decreased scar mobility, Decreased balance, Decreased knowledge of use of DME  Visit Diagnosis: Stiffness of left knee, not elsewhere classified  Localized edema  Acute pain of left knee  Muscle weakness (generalized)  Total knee replacement status, left     Problem List Patient Active Problem List   Diagnosis Date Noted  . Total knee replacement status 02/10/2017  . Chronic anticoagulation 01/26/2017  . Acute pulmonary embolus ([place])   . Primary osteoarthritis of left knee 07/28/2016  . Chest pain   . Abnormal EKG   . Pulmonary embolism ([place]) 06/03/2016  . Degenerative arthritis of right knee 08/08/2015  . Encounter for chronic pain management 04/06/2014  . Tobacco abuse 12/05/2013  . DVT (deep venous thrombosis) ([place]) 11/28/2013  . Candidate for statin therapy due to risk of future cardiovascular event 05/05/2013  . HTN (hypertension) 05/04/2013  . Bilateral knee pain 05/04/2013  . Lumbar back pain 05/04/2013  . Healthcare maintenance 05/04/2013  . BMI 33.0-33.9,adult 05/04/2013  . Hx of substance abuse 05/04/2013    PAA,JENNIFER 04/01/2017, 3:44 PM  [place], [place], [address] Phone: [phone]   Fax:  [phone]  Name: NIC LAMPE MRN: [id number] Date of Birth: 06/27/1965   Raeford Razor, PT 04/01/17 3:44 PM Phone: [phone] Fax: [phone]

## 2017-04-01 NOTE — Patient Instructions (Signed)
Hamstring: Towel Stretch (Supine)    Lie on back. Loop towel around left foot, hip and knee at 90. Straighten knee and pull foot toward body. Hold _30__ seconds. Relax. Repeat __3_ times. Do _2__ times a day. Repeat with other leg.    Copyright  VHI. All rights reserved.   Outer Hip Stretch: Reclined IT Band Stretch (Strap)    Strap around opposite foot, pull across only as far as possible with shoulders on mat. Hold for _3___ breaths. Repeat ___3_ times each leg.  Copyright  VHI. All rights reserved.   Abduction: Side Leg Lift (Eccentric) - Side-Lying    Lie on side. Lift top leg slightly higher than shoulder level. Keep top leg straight with body, toes pointing forward. Slowly lower for 3-5 seconds. ___ reps per set, _2__ sets per day, __5_ days per week. http://ecce.exer.us/62   Copyright  VHI. All rights reserved.

## 2017-04-05 ENCOUNTER — Encounter: Payer: Self-pay | Admitting: Physical Therapy

## 2017-04-05 ENCOUNTER — Ambulatory Visit: Payer: 59 | Admitting: Physical Therapy

## 2017-04-05 DIAGNOSIS — Z96652 Presence of left artificial knee joint: Secondary | ICD-10-CM | POA: Diagnosis not present

## 2017-04-05 DIAGNOSIS — M25562 Pain in left knee: Secondary | ICD-10-CM | POA: Diagnosis not present

## 2017-04-05 DIAGNOSIS — R600 Localized edema: Secondary | ICD-10-CM

## 2017-04-05 DIAGNOSIS — M6281 Muscle weakness (generalized): Secondary | ICD-10-CM

## 2017-04-05 DIAGNOSIS — M25662 Stiffness of left knee, not elsewhere classified: Secondary | ICD-10-CM

## 2017-04-05 NOTE — Therapy (Signed)
[place], [place], [address] Phone: [phone]   Fax:  [phone]  Physical Therapy Treatment  Patient Details  Name: Jeffrey [name] MRN: [id number] Date of Birth: Jul 04, 1965 Referring Provider: Dr. Erlinda Hong    Encounter Date: 04/05/2017  PT End of Session - 04/05/17 1756    Visit Number  6    Number of Visits  24    Date for PT Re-Evaluation  04/27/17    PT Start Time  1503    PT Stop Time  1600    PT Time Calculation (min)  57 min    Activity Tolerance  Patient tolerated treatment well    Behavior During Therapy  [facility] for tasks assessed/performed       Past Medical History:  Diagnosis Date  . Acute pulmonary embolus (HCC)    bilateral submassive PE in setting of missing several doses of Xarelto for his DVT  . Arthritis   . DVT (deep venous thrombosis) ([place]) 11/28/2013   RT LEG  . High cholesterol   . Hypertension   . Marijuana use     Past Surgical History:  Procedure Laterality Date  . HERNIA REPAIR     0626 and 9485; umbilical hernia repair  . KNEE SURGERY Bilateral    Left knee 1993, right knee 2004  . TONSILLECTOMY    . TOTAL KNEE ARTHROPLASTY Left 02/10/2017   Procedure: LEFT TOTAL KNEE ARTHROPLASTY;  Surgeon: Leandrew Koyanagi, MD;  Location: [place];  Service: Orthopedics;  Laterality: Left;    There were no vitals filed for this visit.  Subjective Assessment - 04/05/17 1506    Subjective  Has back pain right low back  5/10.  i need more bending  to get the socks on better.  I use the cane to help   back pain limits ADL.  Needs walker in house for longer standing   sleeps in bouts of 3 hours,  up for a week or 2 then i sleep again.      Currently in Pain?  Yes    Pain Score  5     Pain Location  Knee    Pain Orientation  Left    Pain Descriptors / Indicators  Tightness;Aching stiff    Pain Type  Surgical pain    Aggravating Factors   sitting longer. -  Knee  Standing long period of tim  hurts back    Pain Relieving Factors  moving around,  meds,  Ice,  pillow,    for back sitting,  using walker.     Effect of Pain on Daily Activities  limits sleeping     Multiple Pain Sites  -- Back pain right                      [facility] Adult PT Treatment/Exercise - 04/05/17 0001      Knee/Hip Exercises: Stretches   Quad Stretch  10 seconds 10 X      Knee/Hip Exercises: Aerobic   Nustep  L9 X 7 minutes LE only      Knee/Hip Exercises: Machines for Strengthening   Cybex Leg Press  3 plates X 20,  1 plate left X 30,  right only X 30 1 plate      Knee/Hip Exercises: Standing   Functional Squat  5 reps      Knee/Hip Exercises: Seated   Sit to Sand  5 reps with towel mobs for anterior glides  of tibia.      Knee/Hip Exercises: Supine   Quad Sets  10 reps cues for foot position    Straight Leg Raises  1 set;10 reps quth cues for quad set.  Quad lag       Vasopneumatic   Number Minutes Vasopneumatic   15 minutes    Vasopnuematic Location   Knee    Vasopneumatic Pressure  Medium    Vasopneumatic Temperature   coldest       Manual Therapy   Manual therapy comments  illiopsoas release while on leg press   able to decrease back pain    Joint Mobilization  P/A tibia glides with towel supine and with sit to stand with feet planted.      Kinesiotex  Edema;Facilitate Muscle      Kinesiotix   Edema  knee    Facilitate Muscle   quafds             PT Education - 04/05/17 1756    Education provided  Yes    Education Details  how to SLR with quad set    Person(s) Educated  Patient    Methods  Explanation    Comprehension  Verbalized understanding;Returned demonstration       PT Short Term Goals - 04/05/17 1801      PT SHORT TERM GOAL #1   Title  Pt will be able to sit with knee flexion >/= 90 deg for normal tranfers and ROM .     Time  4    Period  Weeks    Status  Achieved      PT SHORT TERM GOAL #2   Title  Pt will be able to walk with cane and  improved, more natural motion on L knee, min noticeable limp.     Baseline  Antalgic gait worse today , because of his back pain    Time  4    Period  Weeks    Status  On-going      PT SHORT TERM GOAL #3   Title  Pt will be demo <10 deg quad lag in supine to demo improved quad strength.     Baseline  at least 15 degrees,  have modified his technique to work on this.     Time  4    Period  Weeks    Status  On-going      PT SHORT TERM GOAL #4   Title  Pt will be I with HEP and report consistent stretching for max results.     Time  4    Status  Unable to assess        PT Long Term Goals - 03/02/17 1400      PT LONG TERM GOAL #1   Title  Pt will score <50% on FOTO to demo improvement in overall functional mobility.     Time  8    Period  Weeks    Status  New    Target Date  04/27/17      PT LONG TERM GOAL #2   Title  Pt will demo strength in L knee 5/5 and hip at least 4+/5 in extension and abduction for normal gait.     Time  8    Period  Weeks    Status  New    Target Date  04/27/17      PT LONG TERM GOAL #3   Title  Pt will be able to flex L knee  to 110 deg or more for maximal function and preparation for Rt. TKA.     Time  8    Period  Weeks    Status  New    Target Date  04/27/17      PT LONG TERM GOAL #4   Title  Pt will be able to walk with min limp and have no increase in pain up to 45 min to 1 hour, cane if needed.     Time  8    Period  Weeks    Status  New    Target Date  04/27/17            Plan - 04/05/17 1523    Clinical Impression Statement   No pain at end of session.  Quad lag present with SLR at least 15 degrees.  Encouraged patient to SLR with Quad set ayt home.  Patient able to leg press single leg 1 plate 20 X.    Girth mid patella 50 cm.    PT Next Visit Plan  vaso, tape, HEP and progress ROM , quad strength . Gait with cane     PT Home Exercise Plan  gave him wall slides for LLE activation, has and does level 1-2 knee and standing  march, SLR, squat anf toe raises , hip abd and hip stretching     Consulted and Agree with Plan of Care  Patient       Patient will benefit from skilled therapeutic intervention in order to improve the following deficits and impairments:     Visit Diagnosis: Stiffness of left knee, not elsewhere classified  Localized edema  Acute pain of left knee  Muscle weakness (generalized)  Total knee replacement status, left     Problem List Patient Active Problem List   Diagnosis Date Noted  . Total knee replacement status 02/10/2017  . Chronic anticoagulation 01/26/2017  . Acute pulmonary embolus ([place])   . Primary osteoarthritis of left knee 07/28/2016  . Chest pain   . Abnormal EKG   . Pulmonary embolism ([place]) 06/03/2016  . Degenerative arthritis of right knee 08/08/2015  . Encounter for chronic pain management 04/06/2014  . Tobacco abuse 12/05/2013  . DVT (deep venous thrombosis) ([place]) 11/28/2013  . Candidate for statin therapy due to risk of future cardiovascular event 05/05/2013  . HTN (hypertension) 05/04/2013  . Bilateral knee pain 05/04/2013  . Lumbar back pain 05/04/2013  . Healthcare maintenance 05/04/2013  . BMI 33.0-33.9,adult 05/04/2013  . Hx of substance abuse 05/04/2013    Redell Bhandari  PTA 04/05/2017, 6:03 PM  [facility] [address] [place], [place], [address] Phone: [phone]   Fax:  [phone]  Name: Jeffrey [name] MRN: [id number] Date of Birth: 1965/06/10

## 2017-04-07 ENCOUNTER — Ambulatory Visit: Payer: 59 | Admitting: Physical Therapy

## 2017-04-08 ENCOUNTER — Ambulatory Visit: Payer: 59 | Admitting: Physical Therapy

## 2017-04-12 ENCOUNTER — Ambulatory Visit: Payer: 59 | Admitting: Physical Therapy

## 2017-04-12 ENCOUNTER — Encounter: Payer: Self-pay | Admitting: Physical Therapy

## 2017-04-12 DIAGNOSIS — M25662 Stiffness of left knee, not elsewhere classified: Secondary | ICD-10-CM

## 2017-04-12 DIAGNOSIS — R600 Localized edema: Secondary | ICD-10-CM

## 2017-04-12 DIAGNOSIS — Z96652 Presence of left artificial knee joint: Secondary | ICD-10-CM | POA: Diagnosis not present

## 2017-04-12 DIAGNOSIS — M6281 Muscle weakness (generalized): Secondary | ICD-10-CM

## 2017-04-12 DIAGNOSIS — M25562 Pain in left knee: Secondary | ICD-10-CM

## 2017-04-12 NOTE — Patient Instructions (Signed)
Butterfly, Supine    Lie on back, feet together. Lower knees toward floor. Hold __30_ seconds. Repeat _3__ times per session. Do __1_ sessions per day   gentle stretches.  Avoid bounce at end range..  Copyright  VHI. All rights reserved.

## 2017-04-12 NOTE — Therapy (Signed)
[place], [place], [address] Phone: [phone]   Fax:  [phone]  Physical Therapy Treatment  Patient Details  Name: Jeffrey [name] MRN: [id number] Date of Birth: 06/02/1965 Referring Provider: Dr. Erlinda Hong    Encounter Date: 04/12/2017    Past Medical History:  Diagnosis Date  . Acute pulmonary embolus (HCC)    bilateral submassive PE in setting of missing several doses of Xarelto for his DVT  . Arthritis   . DVT (deep venous thrombosis) ([place]) 11/28/2013   RT LEG  . High cholesterol   . Hypertension   . Marijuana use     Past Surgical History:  Procedure Laterality Date  . HERNIA REPAIR     7972 and 8206; umbilical hernia repair  . KNEE SURGERY Bilateral    Left knee 1993, right knee 2004  . TONSILLECTOMY    . TOTAL KNEE ARTHROPLASTY Left 02/10/2017   Procedure: LEFT TOTAL KNEE ARTHROPLASTY;  Surgeon: Leandrew Koyanagi, MD;  Location: [place];  Service: Orthopedics;  Laterality: Left;    There were no vitals filed for this visit.  Subjective Assessment - 04/12/17 1509    Subjective  back has not been hurting too much ,  I have been able to do the exercise with massaging and it helps a lot.  I was under the weather last Friday  and i did not do too much.  No pain right now    Currently in Pain?  No/denies    Pain Score  0-No pain    Pain Orientation  Left    Pain Descriptors / Indicators  Discomfort    Pain Frequency  Intermittent    Aggravating Factors   longer sitting.    Pain Relieving Factors  moving around a little,  ice/ eat as needed    Effect of Pain on Daily Activities  cannot tie shoes         [facility] PT Assessment - 04/12/17 0001      AROM   Left Knee Flexion  92                  [facility] Adult PT Treatment/Exercise - 04/12/17 0001      Knee/Hip Exercises: Stretches   Quad Stretch  10 seconds    Gastroc Stretch  3 reps;30 seconds    Other Knee/Hip Stretches  Butterfly  stretch to assist with donning shoes.      Knee/Hip Exercises: Aerobic   Nustep  L9 X 7 minutes LE only      Knee/Hip Exercises: Machines for Strengthening   Cybex Leg Press  5 plates both feet 15 X 3 sets,, 1 leg 2 plates 15 x 2.       Knee/Hip Exercises: Standing   Heel Raises  10 reps    Functional Squat  5 reps      Knee/Hip Exercises: Seated   Other Seated Knee/Hip Exercises  sitting ball squeeze between feet ans sitting moving  left foot toward opposite knee 10 x  each  to work on goal of being able to tie shoes..    Hamstring Curl  1 set;10 reps      Knee/Hip Exercises: Supine   Heel Slides  1 set;10 reps with strap  painful end range.  No number given    Straight Leg Raises  10 reps quad lag a little less      Vasopneumatic   Number Minutes Vasopneumatic   15 minutes  Vasopnuematic Location   Knee    Vasopneumatic Pressure  High    Vasopneumatic Temperature   coldest       Manual Therapy   Manual Therapy  Joint mobilization    Joint Mobilization  mob with movement with strap,  distraction.  P/A tibia and flexion. PROM improves             PT Education - 04/12/17 1535    Education provided  Yes    Education Details  HEP    Methods  Explanation;Handout    Comprehension  Returned demonstration       PT Short Term Goals - 04/12/17 1835      PT SHORT TERM GOAL #1   Title  Pt will be able to sit with knee flexion >/= 90 deg for normal tranfers and ROM .     Time  4    Period  Weeks    Status  Achieved      PT SHORT TERM GOAL #2   Title  Pt will be able to walk with cane and improved, more natural motion on L knee, min noticeable limp.     Baseline  gait varies ,  improving overall when no back pain    Time  4    Period  Weeks    Status  On-going      PT SHORT TERM GOAL #3   Title  Pt will be demo <10 deg quad lag in supine to demo improved quad strength.     Baseline  improving,  10 degrees with increased reps.(Visual estimate)    Time  4    Period   Weeks    Status  Partially Met      PT SHORT TERM GOAL #4   Title  Pt will be I with HEP and report consistent stretching for max results.     Baseline  not consistand,  under the weather over the weekend    Time  4    Period  Weeks    Status  On-going        PT Long Term Goals - 03/02/17 1400      PT LONG TERM GOAL #1   Title  Pt will score <50% on FOTO to demo improvement in overall functional mobility.     Time  8    Period  Weeks    Status  New    Target Date  04/27/17      PT LONG TERM GOAL #2   Title  Pt will demo strength in L knee 5/5 and hip at least 4+/5 in extension and abduction for normal gait.     Time  8    Period  Weeks    Status  New    Target Date  04/27/17      PT LONG TERM GOAL #3   Title  Pt will be able to flex L knee to 110 deg or more for maximal function and preparation for Rt. TKA.     Time  8    Period  Weeks    Status  New    Target Date  04/27/17      PT LONG TERM GOAL #4   Title  Pt will be able to walk with min limp and have no increase in pain up to 45 min to 1 hour, cane if needed.     Time  8    Period  Weeks    Status  New  Target Date  04/27/17            Plan - 04/12/17 1837    Clinical Impression Statement  STG   for quad lag partially met.   No pain at end of session.  Knee stiff with decreased Flexion may be due   did not exercise due to being under the weather. see flow sheet. he did work hard today.     PT Next Visit Plan  vaso, , HEP and progress ROM , quad strength . Gait with cane  add step ups to HEP    PT Home Exercise Plan  gave him wall slides for LLE activation, has and does level 1-2 knee and standing march, SLR, squat anf toe raises , hip abd and hip stretching     Consulted and Agree with Plan of Care  Patient       Patient will benefit from skilled therapeutic intervention in order to improve the following deficits and impairments:     Visit Diagnosis: Stiffness of left knee, not elsewhere  classified  Localized edema  Acute pain of left knee  Muscle weakness (generalized)  Total knee replacement status, left     Problem List Patient Active Problem List   Diagnosis Date Noted  . Total knee replacement status 02/10/2017  . Chronic anticoagulation 01/26/2017  . Acute pulmonary embolus ([place])   . Primary osteoarthritis of left knee 07/28/2016  . Chest pain   . Abnormal EKG   . Pulmonary embolism ([place]) 06/03/2016  . Degenerative arthritis of right knee 08/08/2015  . Encounter for chronic pain management 04/06/2014  . Tobacco abuse 12/05/2013  . DVT (deep venous thrombosis) ([place]) 11/28/2013  . Candidate for statin therapy due to risk of future cardiovascular event 05/05/2013  . HTN (hypertension) 05/04/2013  . Bilateral knee pain 05/04/2013  . Lumbar back pain 05/04/2013  . Healthcare maintenance 05/04/2013  . BMI 33.0-33.9,adult 05/04/2013  . Hx of substance abuse 05/04/2013    [facility] PTA 04/12/2017, 6:43 PM  [place], [place], [address] Phone: [phone]   Fax:  [phone]  Name: Jeffrey [name] MRN: [id number] Date of Birth: 03/20/1966

## 2017-04-13 NOTE — Therapy (Signed)
[place], [place], [address] Phone: [phone]   Fax:  [phone]  Physical Therapy Treatment  Patient Details  Name: Jeffrey [name] MRN: [id number] Date of Birth: 08/22/1965 Referring Provider: Dr. Erlinda Hong    Encounter Date: 04/12/2017  PT End of Session - 04/13/17 1444    Visit Number  -- 7   Number of Visits  -- 24   Date for PT Re-Evaluation  -- 04/27/2017   PT Start Time PT stop time PT Time calculated Patient tolerated activity well Behavior during Therapy WNL  -- 0100 [id number]      Past Medical History:  Diagnosis Date  . Acute pulmonary embolus (HCC)    bilateral submassive PE in setting of missing several doses of Xarelto for his DVT  . Arthritis   . DVT (deep venous thrombosis) ([facility]) 11/28/2013   RT LEG  . High cholesterol   . Hypertension   . Marijuana use     Past Surgical History:  Procedure Laterality Date  . HERNIA REPAIR     5883 and 2549; umbilical hernia repair  . KNEE SURGERY Bilateral    Left knee 1993, right knee 2004  . TONSILLECTOMY    . TOTAL KNEE ARTHROPLASTY Left 02/10/2017   Procedure: LEFT TOTAL KNEE ARTHROPLASTY;  Surgeon: Leandrew Koyanagi, MD;  Location: [facility];  Service: Orthopedics;  Laterality: Left;    There were no vitals filed for this visit.  Subjective Assessment - 04/12/17 1509    Subjective  back has not been hurting too much ,  I have been able to do the exercise with massaging and it helps a lot.  I was under the weather last Friday  and i did not do too much.  No pain right now    Currently in Pain?  No/denies    Pain Score  0-No pain    Pain Orientation  Left    Pain Descriptors / Indicators  Discomfort    Pain Frequency  Intermittent    Aggravating Factors   longer sitting.    Pain Relieving Factors  moving around a little,  ice/ eat as needed    Effect of Pain on Daily Activities  cannot tie shoes                               PT Education - 04/12/17 1535    Education provided  Yes    Education Details  HEP    Methods  Explanation;Handout    Comprehension  Returned demonstration       PT Short Term Goals - 04/12/17 1835      PT SHORT TERM GOAL #1   Title  Pt will be able to sit with knee flexion >/= 90 deg for normal tranfers and ROM .     Time  4    Period  Weeks    Status  Achieved      PT SHORT TERM GOAL #2   Title  Pt will be able to walk with cane and improved, more natural motion on L knee, min noticeable limp.     Baseline  gait varies ,  improving overall when no back pain    Time  4    Period  Weeks    Status  On-going      PT SHORT TERM GOAL #3   Title  Pt will be demo <10 deg quad  lag in supine to demo improved quad strength.     Baseline  improving,  10 degrees with increased reps.(Visual estimate)    Time  4    Period  Weeks    Status  Partially Met      PT SHORT TERM GOAL #4   Title  Pt will be I with HEP and report consistent stretching for max results.     Baseline  not consistand,  under the weather over the weekend    Time  4    Period  Weeks    Status  On-going        PT Long Term Goals - 03/02/17 1400      PT LONG TERM GOAL #1   Title  Pt will score <50% on FOTO to demo improvement in overall functional mobility.     Time  8    Period  Weeks    Status  New    Target Date  04/27/17      PT LONG TERM GOAL #2   Title  Pt will demo strength in L knee 5/5 and hip at least 4+/5 in extension and abduction for normal gait.     Time  8    Period  Weeks    Status  New    Target Date  04/27/17      PT LONG TERM GOAL #3   Title  Pt will be able to flex L knee to 110 deg or more for maximal function and preparation for Rt. TKA.     Time  8    Period  Weeks    Status  New    Target Date  04/27/17      PT LONG TERM GOAL #4   Title  Pt will be able to walk with min limp and have no increase in pain up to 45 min to 1  hour, cane if needed.     Time  8    Period  Weeks    Status  New    Target Date  04/27/17            Plan - 04/12/17 1837    Clinical Impression Statement  STG   for quad lag partially met.   No pain at end of session.  Knee stiff with decreased Flexion may be due   did not exercise due to being under the weather. see flow sheet. he did work hard today.     PT Next Visit Plan  vaso, , HEP and progress ROM , quad strength . Gait with cane  add step ups to HEP    PT Home Exercise Plan  gave him wall slides for LLE activation, has and does level 1-2 knee and standing march, SLR, squat anf toe raises , hip abd and hip stretching     Consulted and Agree with Plan of Care  Patient       Patient will benefit from skilled therapeutic intervention in order to improve the following deficits and impairments:     Visit Diagnosis: Stiffness of left knee, not elsewhere classified  Localized edema  Acute pain of left knee  Muscle weakness (generalized)  Total knee replacement status, left     Problem List Patient Active Problem List   Diagnosis Date Noted  . Total knee replacement status 02/10/2017  . Chronic anticoagulation 01/26/2017  . Acute pulmonary embolus ([facility])   . Primary osteoarthritis of left knee 07/28/2016  . Chest pain   .  Abnormal EKG   . Pulmonary embolism ([facility]) 06/03/2016  . Degenerative arthritis of right knee 08/08/2015  . Encounter for chronic pain management 04/06/2014  . Tobacco abuse 12/05/2013  . DVT (deep venous thrombosis) ([facility]) 11/28/2013  . Candidate for statin therapy due to risk of future cardiovascular event 05/05/2013  . HTN (hypertension) 05/04/2013  . Bilateral knee pain 05/04/2013  . Lumbar back pain 05/04/2013  . Healthcare maintenance 05/04/2013  . BMI 33.0-33.9,adult 05/04/2013  . Hx of substance abuse 05/04/2013    HARRIS,KAREN PTA 04/13/2017, 2:51 PM  [facility] [address] [place], [place], [address] Phone: [phone]   Fax:  [phone]  Name: Jeffrey [name] MRN: [id number] Date of Birth: 11-12-1965

## 2017-04-13 NOTE — Therapy (Signed)
[place], [place], [address] Phone: [phone]   Fax:  [phone]  Physical Therapy Treatment  Patient Details  Name: Jeffrey [name] MRN: [id number] Date of Birth: 07-Nov-1965 Referring Provider: Dr. Erlinda Hong    Encounter Date: 04/12/2017  PT End of Session - 04/13/17 1444    Visit Number  --7   Number of Visits  -- 24   Date for PT Re-Evaluation  -- 04/27/2017   PT Start Time PT stop time 1602  -- 1505       Past Medical History:  Diagnosis Date  . Acute pulmonary embolus (HCC)    bilateral submassive PE in setting of missing several doses of Xarelto for his DVT  . Arthritis   . DVT (deep venous thrombosis) ([facility]) 11/28/2013   RT LEG  . High cholesterol   . Hypertension   . Marijuana use     Past Surgical History:  Procedure Laterality Date  . HERNIA REPAIR     9371 and 6967; umbilical hernia repair  . KNEE SURGERY Bilateral    Left knee 1993, right knee 2004  . TONSILLECTOMY    . TOTAL KNEE ARTHROPLASTY Left 02/10/2017   Procedure: LEFT TOTAL KNEE ARTHROPLASTY;  Surgeon: Leandrew Koyanagi, MD;  Location: [facility];  Service: Orthopedics;  Laterality: Left;    There were no vitals filed for this visit.  Subjective Assessment - 04/12/17 1509    Subjective  back has not been hurting too much ,  I have been able to do the exercise with massaging and it helps a lot.  I was under the weather last Friday  and i did not do too much.  No pain right now    Currently in Pain?  No/denies    Pain Score  0-No pain    Pain Orientation  Left    Pain Descriptors / Indicators  Discomfort    Pain Frequency  Intermittent    Aggravating Factors   longer sitting.    Pain Relieving Factors  moving around a little,  ice/ eat as needed    Effect of Pain on Daily Activities  cannot tie shoes                              PT Education - 04/12/17 1535    Education provided  Yes    Education  Details  HEP    Methods  Explanation;Handout    Comprehension  Returned demonstration       PT Short Term Goals - 04/12/17 1835      PT SHORT TERM GOAL #1   Title  Pt will be able to sit with knee flexion >/= 90 deg for normal tranfers and ROM .     Time  4    Period  Weeks    Status  Achieved      PT SHORT TERM GOAL #2   Title  Pt will be able to walk with cane and improved, more natural motion on L knee, min noticeable limp.     Baseline  gait varies ,  improving overall when no back pain    Time  4    Period  Weeks    Status  On-going      PT SHORT TERM GOAL #3   Title  Pt will be demo <10 deg quad lag in supine to demo improved quad strength.  Baseline  improving,  10 degrees with increased reps.(Visual estimate)    Time  4    Period  Weeks    Status  Partially Met      PT SHORT TERM GOAL #4   Title  Pt will be I with HEP and report consistent stretching for max results.     Baseline  not consistand,  under the weather over the weekend    Time  4    Period  Weeks    Status  On-going        PT Long Term Goals - 03/02/17 1400      PT LONG TERM GOAL #1   Title  Pt will score <50% on FOTO to demo improvement in overall functional mobility.     Time  8    Period  Weeks    Status  New    Target Date  04/27/17      PT LONG TERM GOAL #2   Title  Pt will demo strength in L knee 5/5 and hip at least 4+/5 in extension and abduction for normal gait.     Time  8    Period  Weeks    Status  New    Target Date  04/27/17      PT LONG TERM GOAL #3   Title  Pt will be able to flex L knee to 110 deg or more for maximal function and preparation for Rt. TKA.     Time  8    Period  Weeks    Status  New    Target Date  04/27/17      PT LONG TERM GOAL #4   Title  Pt will be able to walk with min limp and have no increase in pain up to 45 min to 1 hour, cane if needed.     Time  8    Period  Weeks    Status  New    Target Date  04/27/17            Plan -  04/12/17 1837    Clinical Impression Statement  STG   for quad lag partially met.   No pain at end of session.  Knee stiff with decreased Flexion may be due   did not exercise due to being under the weather. see flow sheet. he did work hard today.     PT Next Visit Plan  vaso, , HEP and progress ROM , quad strength . Gait with cane  add step ups to HEP    PT Home Exercise Plan  gave him wall slides for LLE activation, has and does level 1-2 knee and standing march, SLR, squat anf toe raises , hip abd and hip stretching     Consulted and Agree with Plan of Care  Patient       Patient will benefit from skilled therapeutic intervention in order to improve the following deficits and impairments:     Visit Diagnosis: Stiffness of left knee, not elsewhere classified  Localized edema  Acute pain of left knee  Muscle weakness (generalized)  Total knee replacement status, left     Problem List Patient Active Problem List   Diagnosis Date Noted  . Total knee replacement status 02/10/2017  . Chronic anticoagulation 01/26/2017  . Acute pulmonary embolus ([facility])   . Primary osteoarthritis of left knee 07/28/2016  . Chest pain   . Abnormal EKG   . Pulmonary embolism ([facility]) 06/03/2016  . Degenerative  arthritis of right knee 08/08/2015  . Encounter for chronic pain management 04/06/2014  . Tobacco abuse 12/05/2013  . DVT (deep venous thrombosis) (HCC) 11/28/2013  . Candidate for statin therapy due to risk of future cardiovascular event 05/05/2013  . HTN (hypertension) 05/04/2013  . Bilateral knee pain 05/04/2013  . Lumbar back pain 05/04/2013  . Healthcare maintenance 05/04/2013  . BMI 33.0-33.9,adult 05/04/2013  . Hx of substance abuse 05/04/2013    HARRIS,KAREN 04/13/2017, 2:48 PM  [facility] [address] [place], [place], [address] Phone: [phone]   Fax:  [phone]  Name: Gyan S Chard MRN: [id number] Date of Birth:  01/20/1966   

## 2017-04-14 ENCOUNTER — Encounter: Payer: Self-pay | Admitting: Physical Therapy

## 2017-04-14 ENCOUNTER — Ambulatory Visit: Payer: 59 | Admitting: Physical Therapy

## 2017-04-14 DIAGNOSIS — M25562 Pain in left knee: Secondary | ICD-10-CM | POA: Diagnosis not present

## 2017-04-14 DIAGNOSIS — M25662 Stiffness of left knee, not elsewhere classified: Secondary | ICD-10-CM | POA: Diagnosis not present

## 2017-04-14 DIAGNOSIS — M6281 Muscle weakness (generalized): Secondary | ICD-10-CM

## 2017-04-14 DIAGNOSIS — Z96652 Presence of left artificial knee joint: Secondary | ICD-10-CM

## 2017-04-14 DIAGNOSIS — R600 Localized edema: Secondary | ICD-10-CM

## 2017-04-14 NOTE — Therapy (Signed)
[name], [place], [address] Phone: [phone]   Fax:  [phone]  Physical Therapy Treatment  Patient Details  Name: Jeffrey [name] MRN: [id number] Date of Birth: [birth year] Referring Provider: Dr. Erlinda Hong    Encounter Date: 04/14/2017  PT End of Session - 04/14/17 1811    Visit Number  8    Number of Visits  24    Date for PT Re-Evaluation  04/27/17    PT Start Time  1505    PT Stop Time  1545    PT Time Calculation (min)  40 min    Activity Tolerance  Patient tolerated treatment well    Behavior During Therapy  Jeffrey [name] for tasks assessed/performed       Past Medical History:  Diagnosis Date  . Acute pulmonary embolus (HCC)    bilateral submassive PE in setting of missing several doses of Xarelto for his DVT  . Arthritis   . DVT (deep venous thrombosis) ([facility]) 11/28/2013   RT LEG  . High cholesterol   . Hypertension   . Marijuana use     Past Surgical History:  Procedure Laterality Date  . HERNIA REPAIR     6734 and 1937; umbilical hernia repair  . KNEE SURGERY Bilateral    Left knee 1993, right knee 2004  . TONSILLECTOMY    . TOTAL KNEE ARTHROPLASTY Left 02/10/2017   Procedure: LEFT TOTAL KNEE ARTHROPLASTY;  Surgeon: Leandrew Koyanagi, MD;  Location: [facility];  Service: Orthopedics;  Laterality: Left;    There were no vitals filed for this visit.  Subjective Assessment - 04/14/17 1807    Subjective  I have been sore today.  6/10 ,  My back is pretty good.  i worked hard on extension and the butterfly stretch.  I tied my shoes for the first time today!    Currently in Pain?  Yes    Pain Score  6     Pain Location  Knee    Pain Orientation  Left    Pain Descriptors / Indicators  Aching    Pain Type  Surgical pain    Pain Frequency  Intermittent    Aggravating Factors   longer sitting,  weather?    Pain Relieving Factors  moving around,  ice,  heat         [facility] PT Assessment - 04/14/17 0001       AROM   Left Knee Extension  -5    Left Knee Flexion  100                  [facility] Adult PT Treatment/Exercise - 04/14/17 0001      Knee/Hip Exercises: Aerobic   Nustep  l 9 4 MINUTES FATIGUED      Knee/Hip Exercises: Machines for Strengthening   Cybex Leg Press  6  PLATES (pATIENT REQUEST,  2 SETS 10 X BOTH,   SINGLE 2 PLATES 10 X 2      Knee/Hip Exercises: Standing   Lateral Step Up  1 set;10 reps    Forward Step Up  2 sets;10 reps;Hand Hold: 0;Step Height: 6"    Step Down  1 set;5 reps FATIGUE,  SHAKEY      Knee/Hip Exercises: Seated   Hamstring Curl  3 sets;10 reps    Hamstring Limitations  bLUE BAND,  hep      Knee/Hip Exercises: Supine   Straight Leg Raises  1 set;10 reps  Manual Therapy   Manual Therapy  Joint mobilization    Joint Mobilization  mob with movement with strap,  distraction.  P/A tibia and flexion. PROM improves             PT Education - 04/14/17 1538    Education provided  Yes    Education Details  hep    Methods  Explanation;Demonstration;Verbal cues;Handout    Comprehension  Verbalized understanding;Returned demonstration       PT Short Term Goals - 04/14/17 1815      PT SHORT TERM GOAL #1   Title  Pt will be able to sit with knee flexion >/= 90 deg for normal tranfers and ROM .     Time  4    Period  Weeks    Status  Achieved      PT SHORT TERM GOAL #2   Title  Pt will be able to walk with cane and improved, more natural motion on L knee, min noticeable limp.     Baseline  gait varies ,  improving overall when no back pain,  limps today    Time  4    Period  Weeks    Status  On-going      PT SHORT TERM GOAL #3   Title  Pt will be demo <10 deg quad lag in supine to demo improved quad strength.     Baseline  able to demo    Time  4    Period  Weeks    Status  Achieved      PT SHORT TERM GOAL #4   Title  Pt will be I with HEP and report consistent stretching for max results.     Baseline  doing some  exercise,      Time  4    Period  Weeks    Status  On-going        PT Long Term Goals - 03/02/17 1400      PT LONG TERM GOAL #1   Title  Pt will score <50% on FOTO to demo improvement in overall functional mobility.     Time  8    Period  Weeks    Status  New    Target Date  04/27/17      PT LONG TERM GOAL #2   Title  Pt will demo strength in L knee 5/5 and hip at least 4+/5 in extension and abduction for normal gait.     Time  8    Period  Weeks    Status  New    Target Date  04/27/17      PT LONG TERM GOAL #3   Title  Pt will be able to flex L knee to 110 deg or more for maximal function and preparation for Rt. TKA.     Time  8    Period  Weeks    Status  New    Target Date  04/27/17      PT LONG TERM GOAL #4   Title  Pt will be able to walk with min limp and have no increase in pain up to 45 min to 1 hour, cane if needed.     Time  8    Period  Weeks    Status  New    Target Date  04/27/17            Plan - 04/14/17 1812    Clinical Impression Statement  100 to -5 .  Progress toward ROM goal.  STG#3 met.  Mild pain increased at end of session he declined the need for Modalities.      PT Next Visit Plan  vaso, , HEP and progress ROM , quad strength . Gait with cane  review step ups to HEP.  FOTO    PT Home Exercise Plan  gave him wall slides for LLE activation, has and does level 1-2 knee and standing march, SLR, squat anf toe raises , hip abd and hip stretching .  3 way eccentric quads on step    Consulted and Agree with Plan of Care  Patient       Patient will benefit from skilled therapeutic intervention in order to improve the following deficits and impairments:     Visit Diagnosis: Stiffness of left knee, not elsewhere classified  Localized edema  Acute pain of left knee  Muscle weakness (generalized)  Total knee replacement status, left     Problem List Patient Active Problem List   Diagnosis Date Noted  . Total knee replacement status  02/10/2017  . Chronic anticoagulation 01/26/2017  . Acute pulmonary embolus ([facility])   . Primary osteoarthritis of left knee 07/28/2016  . Chest pain   . Abnormal EKG   . Pulmonary embolism ([facility]) 06/03/2016  . Degenerative arthritis of right knee 08/08/2015  . Encounter for chronic pain management 04/06/2014  . Tobacco abuse 12/05/2013  . DVT (deep venous thrombosis) ([facility]) 11/28/2013  . Candidate for statin therapy due to risk of future cardiovascular event 05/05/2013  . HTN (hypertension) 05/04/2013  . Bilateral knee pain 05/04/2013  . Lumbar back pain 05/04/2013  . Healthcare maintenance 05/04/2013  . BMI 33.0-33.9,adult 05/04/2013  . Hx of substance abuse 05/04/2013    [facility]   PTA 04/14/2017, 6:17 PM  [place], [place], [address] Phone: [phone]   Fax:  [phone]  Name: REHMAN LEVINSON MRN: [id number] Date of Birth: 1965-11-29

## 2017-04-14 NOTE — Patient Instructions (Addendum)
FLEXION: Sitting - Resistance Band (Active)    Sit with lEFT leg extended. Against bLUE resistance band, bend knee and draw foot backward. Complete _1 TO 3__ sets of _10__ repetitions. Perform __1_ sessions per day.  http://gtsc.exer.us/230   Copyright  VHI. All rights reserved.  Knee Extension: Step-Down Forward / Sideways / Backward (Eccentric)    Stand, holding support, affected foot on step. Slowly bend affected knee for 3-5 seconds and bring other heel forward to floor. Quickly straighten affected leg. Repeat, placing foot flat to side. Repeat, touching toe behind. _5 TO10__ reps per set, 1-3___ sets per day, __3-4 _ days per week.   http://ecce.exer.us/134          http://cc.exer.us/5   Copyright  VHI. All rights reserved.

## 2017-04-15 ENCOUNTER — Ambulatory Visit: Payer: 59 | Admitting: Physical Therapy

## 2017-04-15 ENCOUNTER — Telehealth: Payer: Self-pay | Admitting: Physical Therapy

## 2017-04-15 NOTE — Telephone Encounter (Signed)
Patient missed his appt today at 3:00.  Left message on his voicemail, reminded him of his next appt.   Raeford Razor, PT 04/15/17 3:44 PM Phone: [phone] Fax: [phone]

## 2017-04-19 ENCOUNTER — Ambulatory Visit: Payer: 59 | Admitting: Physical Therapy

## 2017-04-21 ENCOUNTER — Ambulatory Visit: Payer: 59 | Admitting: Physical Therapy

## 2017-04-21 ENCOUNTER — Encounter: Payer: Self-pay | Admitting: Physical Therapy

## 2017-04-21 DIAGNOSIS — R600 Localized edema: Secondary | ICD-10-CM

## 2017-04-21 DIAGNOSIS — M25562 Pain in left knee: Secondary | ICD-10-CM

## 2017-04-21 DIAGNOSIS — Z96652 Presence of left artificial knee joint: Secondary | ICD-10-CM | POA: Diagnosis not present

## 2017-04-21 DIAGNOSIS — M6281 Muscle weakness (generalized): Secondary | ICD-10-CM

## 2017-04-21 DIAGNOSIS — M25662 Stiffness of left knee, not elsewhere classified: Secondary | ICD-10-CM

## 2017-04-21 NOTE — Therapy (Signed)
[place], [place], [address] Phone: [phone]   Fax:  [phone]  Physical Therapy Treatment  Patient Details  Name: Jeffrey [name] MRN: [id number] Date of Birth: 1965/11/23 Referring Provider: Dr. Erlinda Hong    Encounter Date: 04/21/2017  PT End of Session - 04/21/17 1429    Visit Number  9    Number of Visits  24    Date for PT Re-Evaluation  05/28/17    PT Start Time  6962    PT Stop Time  1500    PT Time Calculation (min)  45 min    Activity Tolerance  Patient tolerated treatment well    Behavior During Therapy  [facility] for tasks assessed/performed       Past Medical History:  Diagnosis Date  . Acute pulmonary embolus (HCC)    bilateral submassive PE in setting of missing several doses of Xarelto for his DVT  . Arthritis   . DVT (deep venous thrombosis) ([place]) 11/28/2013   RT LEG  . High cholesterol   . Hypertension   . Marijuana use     Past Surgical History:  Procedure Laterality Date  . HERNIA REPAIR     9528 and 4132; umbilical hernia repair  . KNEE SURGERY Bilateral    Left knee 1993, right knee 2004  . TONSILLECTOMY    . TOTAL KNEE ARTHROPLASTY Left 02/10/2017   Procedure: LEFT TOTAL KNEE ARTHROPLASTY;  Surgeon: Leandrew Koyanagi, MD;  Location: [facility];  Service: Orthopedics;  Laterality: Left;    There were no vitals filed for this visit.  Subjective Assessment - 04/21/17 1418    Subjective  No pain really.  Not hurting in back.  I am doing better with getting my shoe and socks.          [facility] PT Assessment - 04/21/17 0001      Observation/Other Assessments   Focus on Therapeutic Outcomes (FOTO)   68%      AROM   Left Knee Extension  -5    Left Knee Flexion  102                  [facility] Adult PT Treatment/Exercise - 04/21/17 0001      Ambulation/Gait   Gait Comments  walked with cane, cues for hip extension and heel strike , trunk extension, did well but noticeable  limp       Knee/Hip Exercises: Stretches   Other Knee/Hip Stretches  internal rotation stretch     Other Knee/Hip Stretches  Butterfly stretch to assist with donning shoes. encouraged to move in and out as well as holding       Knee/Hip Exercises: Aerobic   Stationary Bike  5 min worked on full revolution no tension, painful and difficult       Knee/Hip Exercises: Standing   Heel Raises  Left;15 reps    Forward Step Up  Left;1 set;15 reps;Hand Hold: 2;Step Height: 8"    Other Standing Knee Exercises  lunge for ROM on 8 inch step       Knee/Hip Exercises: Seated   Sit to Sand  2 sets;10 reps;without UE support worked on alignment of LE, Hips and back flat       Knee/Hip Exercises: Supine   Quad Sets  Strengthening;Both;1 set glute sets x 10     Bridges  Strengthening;Both;1 set;10 reps    Bridges with [employer]  Strengthening;Both;1 set;10 reps    Knee Flexion  Strengthening;Both;1 set;10 reps PPT prior for lower abd (march)     Other Supine Knee/Hip Exercises  posterior pelvic tilt x 10 mod cues  core engagement       Cryotherapy   Number Minutes Cryotherapy  5 Minutes    Cryotherapy Location  Knee    Type of Cryotherapy  Ice pack during FOTO will cont at home if needed       Manual Therapy   Joint Mobilization  extension and flexion Gr I-II to tolerance                PT Short Term Goals - 04/21/17 1426      PT SHORT TERM GOAL #1   Title  Pt will be able to sit with knee flexion >/= 90 deg for normal tranfers and ROM .     Status  Achieved      PT SHORT TERM GOAL #2   Title  Pt will be able to walk with cane and improved, more natural motion on L knee, min noticeable limp.     Baseline  cues needed     Status  On-going      PT SHORT TERM GOAL #3   Title  Pt will be demo <10 deg quad lag in supine to demo improved quad strength.     Status  Achieved      PT SHORT TERM GOAL #4   Title  Pt will be I with HEP and report consistent stretching for max results.      Status  Achieved        PT Long Term Goals - 04/21/17 1524      PT LONG TERM GOAL #1   Title  Pt will score <50% on FOTO to demo improvement in overall functional mobility.     Status  On-going      PT LONG TERM GOAL #2   Title  Pt will demo strength in L knee 5/5 and hip at least 4+/5 in extension and abduction for normal gait.     Status  On-going      PT LONG TERM GOAL #3   Title  Pt will be able to flex L knee to 110 deg or more for maximal function and preparation for Rt. TKA.     Status  On-going      PT LONG TERM GOAL #4   Title  Pt will be able to walk with min limp and have no increase in pain up to 45 min to 1 hour, cane if needed.     Status  On-going            Plan - 04/21/17 1459    Clinical Impression Statement  FOTO score 8 % improved  since initial eval.  Patient cont to lack normal gait patterns, hip, core and knee strength and flexibility.  He is using cane 50% of the time.  He works very hard when in PT.  tightness in hips limits transfers and ability to perform lower body dressing (although improving).. He will cont to benefit from more PT.    PT Frequency  3x / week 2 to 3 times per week     PT Duration  4 weeks    PT Treatment/Interventions  ADLs/Self Care Home Management;Cryotherapy;Ultrasound;Gait training;Stair training;Balance training;Manual lymph drainage;Dry needling;Passive range of motion;Manual techniques;Patient/family education;Therapeutic exercise;DME Instruction;Electrical Stimulation;Functional mobility training;Therapeutic activities;Neuromuscular re-education;Taping;Vasopneumatic Device    PT Next Visit Plan  progress ROM , quad strength . Gait with cane  review step ups to HEP.  FOTO    PT Home Exercise Plan  gave him wall slides for LLE activation, has and does level 1-2 knee and standing march, SLR, squat anf toe raises , hip abd and hip stretching .  3 way eccentric quads on step    Consulted and Agree with Plan of Care  Patient        Patient will benefit from skilled therapeutic intervention in order to improve the following deficits and impairments:  Decreased mobility, Abnormal gait, Difficulty walking, Hypomobility, Obesity, Improper body mechanics, Increased edema, Decreased range of motion, Decreased activity tolerance, Decreased strength, Increased fascial restricitons, Impaired flexibility, Postural dysfunction, Pain, Decreased skin integrity, Decreased scar mobility, Decreased balance, Decreased knowledge of use of DME  Visit Diagnosis: Stiffness of left knee, not elsewhere classified  Localized edema  Acute pain of left knee  Muscle weakness (generalized)  Total knee replacement status, left     Problem List Patient Active Problem List   Diagnosis Date Noted  . Total knee replacement status 02/10/2017  . Chronic anticoagulation 01/26/2017  . Acute pulmonary embolus ([place])   . Primary osteoarthritis of left knee 07/28/2016  . Chest pain   . Abnormal EKG   . Pulmonary embolism ([place]) 06/03/2016  . Degenerative arthritis of right knee 08/08/2015  . Encounter for chronic pain management 04/06/2014  . Tobacco abuse 12/05/2013  . DVT (deep venous thrombosis) ([place]) 11/28/2013  . Candidate for statin therapy due to risk of future cardiovascular event 05/05/2013  . HTN (hypertension) 05/04/2013  . Bilateral knee pain 05/04/2013  . Lumbar back pain 05/04/2013  . Healthcare maintenance 05/04/2013  . BMI 33.0-33.9,adult 05/04/2013  . Hx of substance abuse 05/04/2013    Desyre Calma 04/21/2017, 3:26 PM  [facility] [address] [place], [place], [address] Phone: [phone]   Fax:  [phone]  Name: Jeffrey [name] MRN: [id number] Date of Birth: 1965/07/23  Raeford Razor, PT 04/21/17 3:26 PM Phone: [phone] Fax: [phone]

## 2017-04-22 ENCOUNTER — Ambulatory Visit: Payer: 59 | Admitting: Physical Therapy

## 2017-04-22 ENCOUNTER — Telehealth: Payer: Self-pay | Admitting: Physical Therapy

## 2017-04-22 NOTE — Telephone Encounter (Signed)
Pt missed today's appt at 3:45.  Reminded patient of his visit 04/27/17.   Raeford Razor, PT 04/22/17 4:24 PM Phone: [phone] Fax: [phone]

## 2017-04-27 ENCOUNTER — Ambulatory Visit: Payer: 59 | Admitting: Physical Therapy

## 2017-04-27 ENCOUNTER — Encounter: Payer: Self-pay | Admitting: Physical Therapy

## 2017-04-27 DIAGNOSIS — Z96652 Presence of left artificial knee joint: Secondary | ICD-10-CM

## 2017-04-27 DIAGNOSIS — R600 Localized edema: Secondary | ICD-10-CM | POA: Diagnosis not present

## 2017-04-27 DIAGNOSIS — M25562 Pain in left knee: Secondary | ICD-10-CM | POA: Diagnosis not present

## 2017-04-27 DIAGNOSIS — M25662 Stiffness of left knee, not elsewhere classified: Secondary | ICD-10-CM

## 2017-04-27 DIAGNOSIS — M6281 Muscle weakness (generalized): Secondary | ICD-10-CM | POA: Diagnosis not present

## 2017-04-27 NOTE — Patient Instructions (Signed)
Go back to basics for HEP. Need to get quads stronger

## 2017-04-27 NOTE — Therapy (Signed)
[place], [place], [address] Phone: [phone]   Fax:  [phone]  Physical Therapy Treatment  Patient Details  Name: Jeffrey [name] MRN: [id number] Date of Birth: 04-21-1965 Referring Provider: Dr. Erlinda Hong    Encounter Date: 04/27/2017  PT End of Session - 04/27/17 1603    Visit Number  10    Number of Visits  24    Date for PT Re-Evaluation  05/28/17    PT Start Time  1505    PT Stop Time  1545    PT Time Calculation (min)  40 min    Activity Tolerance  Patient tolerated treatment well    Behavior During Therapy  [facility] for tasks assessed/performed       Past Medical History:  Diagnosis Date  . Acute pulmonary embolus (HCC)    bilateral submassive PE in setting of missing several doses of Xarelto for his DVT  . Arthritis   . DVT (deep venous thrombosis) ([place]) 11/28/2013   RT LEG  . High cholesterol   . Hypertension   . Marijuana use     Past Surgical History:  Procedure Laterality Date  . HERNIA REPAIR     6546 and 5035; umbilical hernia repair  . KNEE SURGERY Bilateral    Left knee 1993, right knee 2004  . TONSILLECTOMY    . TOTAL KNEE ARTHROPLASTY Left 02/10/2017   Procedure: LEFT TOTAL KNEE ARTHROPLASTY;  Surgeon: Leandrew Koyanagi, MD;  Location: [facility];  Service: Orthopedics;  Laterality: Left;    There were no vitals filed for this visit.  Subjective Assessment - 04/27/17 1515    Subjective  back pain and knee pain today.  has been using the hamstring band and is seeing results.    Currently in Pain?  Yes    Pain Score  6     Pain Location  Knee    Pain Orientation  Left;Anterior    Pain Descriptors / Indicators  Aching    Aggravating Factors   bad weather,  sitting longer then getting up    Pain Relieving Factors  Moving around,  ice,k heat    Effect of Pain on Daily Activities  Bending squatting.  difficult    Multiple Pain Sites  -- back pain          [facility] PT Assessment -  04/27/17 0001      PROM   Overall PROM Comments  0 to  107  knee left                  [facility] Adult PT Treatment/Exercise - 04/27/17 0001      Knee/Hip Exercises: Aerobic   Nustep  5 minutes LE only        Knee/Hip Exercises: Standing   Functional Squat  10 reps holding sink.      Knee/Hip Exercises: Seated   Hamstring Curl  3 sets;10 reps    Hamstring Limitations  bLUE BAND,  hep      Knee/Hip Exercises: Supine   Quad Sets  10 reps;Both quads unabul to pull knee fully straight.      Heel Slides  10 reps;AAROM    Straight Leg Raises  10 reps;2 sets with quad lag      Moist Heat Therapy   Number Minutes Moist Heat  5 Minutes mobility patella improved    Moist Heat Location  Knee concurrent with patellar mobs for increased tissue flexibili  Manual Therapy   Manual Therapy  Joint mobilization    Manual therapy comments  Patellar creeping medial lateral,  moving however not yet WNL ,      Joint Mobilization  for flexion with movement and strap and rolled towel              PT Education - 04/27/17 1603    Education provided  Yes    Education Details  HEP importance    Person(s) Educated  Patient    Methods  Explanation    Comprehension  Verbalized understanding       PT Short Term Goals - 04/21/17 1426      PT SHORT TERM GOAL #1   Title  Pt will be able to sit with knee flexion >/= 90 deg for normal tranfers and ROM .     Status  Achieved      PT SHORT TERM GOAL #2   Title  Pt will be able to walk with cane and improved, more natural motion on L knee, min noticeable limp.     Baseline  cues needed     Status  On-going      PT SHORT TERM GOAL #3   Title  Pt will be demo <10 deg quad lag in supine to demo improved quad strength.     Status  Achieved      PT SHORT TERM GOAL #4   Title  Pt will be I with HEP and report consistent stretching for max results.     Status  Achieved        PT Long Term Goals - 04/27/17 1610      PT LONG TERM  GOAL #1   Title  Pt will score <50% on FOTO to demo improvement in overall functional mobility.     Baseline  8 % improved on 04/21/2017    Time  8    Period  Weeks    Status  On-going      PT LONG TERM GOAL #2   Title  Pt will demo strength in L knee 5/5 and hip at least 4+/5 in extension and abduction for normal gait.     Time  8    Period  Weeks    Status  Unable to assess      PT LONG TERM GOAL #3   Title  Pt will be able to flex L knee to 110 deg or more for maximal function and preparation for Rt. TKA.     Baseline  107 AA    Time  8    Period  Weeks    Status  Partially Met      PT LONG TERM GOAL #4   Title  Pt will be able to walk with min limp and have no increase in pain up to 45 min to 1 hour, cane if needed.     Baseline  limps,  ( back pain and knee pain)    Time  8    Period  Weeks    Status  On-going            Plan - 04/27/17 1603    Clinical Impression Statement  0 degrees PROM left knee,  AA flexion 107.  Patient declined the need for modalities for pain at end of session saying his knee and back both feel better after the exercises at sink(Mini squat).   Patient fatigues quickly with exercise. LTG#3 partially met    PT Next Visit  Plan  progress ROM , quad strength .   review step ups to HEP.  Consider terminal knee extension.  Work on decreasing limp...    PT Home Exercise Plan  gave him wall slides for LLE activation, has and does level 1-2 knee and standing march, SLR, squat anf toe raises , hip abd and hip stretching .  3 way eccentric quads on step    Consulted and Agree with Plan of Care  Patient       Patient will benefit from skilled therapeutic intervention in order to improve the following deficits and impairments:     Visit Diagnosis: Stiffness of left knee, not elsewhere classified  Localized edema  Acute pain of left knee  Muscle weakness (generalized)  Total knee replacement status, left     Problem List Patient Active Problem  List   Diagnosis Date Noted  . Total knee replacement status 02/10/2017  . Chronic anticoagulation 01/26/2017  . Acute pulmonary embolus ([place])   . Primary osteoarthritis of left knee 07/28/2016  . Chest pain   . Abnormal EKG   . Pulmonary embolism ([place]) 06/03/2016  . Degenerative arthritis of right knee 08/08/2015  . Encounter for chronic pain management 04/06/2014  . Tobacco abuse 12/05/2013  . DVT (deep venous thrombosis) ([place]) 11/28/2013  . Candidate for statin therapy due to risk of future cardiovascular event 05/05/2013  . HTN (hypertension) 05/04/2013  . Bilateral knee pain 05/04/2013  . Lumbar back pain 05/04/2013  . Healthcare maintenance 05/04/2013  . BMI 33.0-33.9,adult 05/04/2013  . Hx of substance abuse 05/04/2013    Nevea Spiewak PTA 04/27/2017, 4:15 PM  [facility] [address] [place], [place], [address] Phone: [phone]   Fax:  [phone]  Name: Jeffrey [name] MRN: [id number] Date of Birth: [birth year]

## 2017-04-28 ENCOUNTER — Ambulatory Visit: Payer: 59 | Admitting: Physical Therapy

## 2017-04-29 ENCOUNTER — Ambulatory Visit: Payer: 59

## 2017-04-30 ENCOUNTER — Ambulatory Visit: Payer: 59 | Admitting: Physical Therapy

## 2017-05-03 ENCOUNTER — Encounter: Payer: Self-pay | Admitting: Physical Therapy

## 2017-05-03 ENCOUNTER — Ambulatory Visit: Payer: 59 | Attending: Orthopaedic Surgery | Admitting: Physical Therapy

## 2017-05-03 DIAGNOSIS — M25562 Pain in left knee: Secondary | ICD-10-CM

## 2017-05-03 DIAGNOSIS — M6281 Muscle weakness (generalized): Secondary | ICD-10-CM | POA: Diagnosis not present

## 2017-05-03 DIAGNOSIS — R600 Localized edema: Secondary | ICD-10-CM

## 2017-05-03 DIAGNOSIS — Z96652 Presence of left artificial knee joint: Secondary | ICD-10-CM | POA: Diagnosis not present

## 2017-05-03 DIAGNOSIS — M25662 Stiffness of left knee, not elsewhere classified: Secondary | ICD-10-CM | POA: Diagnosis not present

## 2017-05-03 NOTE — Therapy (Signed)
[place], [place], [address] Phone: [phone]   Fax:  [phone]  Physical Therapy Treatment  Patient Details  Name: Jeffrey [name] MRN: [id number] Date of Birth: 1965/04/15 Referring Provider: Dr. Erlinda Hong    Encounter Date: 05/03/2017  PT End of Session - 05/03/17 1106    Visit Number  11    Number of Visits  24    Date for PT Re-Evaluation  05/28/17    PT Start Time  1103    PT Stop Time  1149    PT Time Calculation (min)  46 min       Past Medical History:  Diagnosis Date  . Acute pulmonary embolus (HCC)    bilateral submassive PE in setting of missing several doses of Xarelto for his DVT  . Arthritis   . DVT (deep venous thrombosis) ([place]) 11/28/2013   RT LEG  . High cholesterol   . Hypertension   . Marijuana use     Past Surgical History:  Procedure Laterality Date  . HERNIA REPAIR     9678 and 9381; umbilical hernia repair  . KNEE SURGERY Bilateral    Left knee 1993, right knee 2004  . TONSILLECTOMY    . TOTAL KNEE ARTHROPLASTY Left 02/10/2017   Procedure: LEFT TOTAL KNEE ARTHROPLASTY;  Surgeon: Leandrew Koyanagi, MD;  Location: [facility];  Service: Orthopedics;  Laterality: Left;    There were no vitals filed for this visit.  Subjective Assessment - 05/03/17 1106    Currently in Pain?  Yes    Pain Score  5     Pain Location  Knee    Pain Orientation  Left;Anterior    Pain Type  Surgical pain                      [facility] Adult PT Treatment/Exercise - 05/03/17 0001      Ambulation/Gait   Gait Comments  gait without cane, cues to decrease trunk lean, increased knee flexion, equalize step length      Knee/Hip Exercises: Aerobic   Nustep  5 minutes L10 -pt set up himself       Knee/Hip Exercises: Seated   Other Seated Knee/Hip Exercises  seated quad set with SLR lift       Knee/Hip Exercises: Supine   Quad Sets  10 reps 10 sec    Short Arc Quad Sets  20 reps     Straight Leg Raises  1 set;10 reps with initial quad set      Knee/Hip Exercises: Prone   Other Prone Exercises  knee flexion 5 x 30 sec with strap       Vasopneumatic   Number Minutes Vasopneumatic   15 minutes    Vasopnuematic Location   Knee    Vasopneumatic Pressure  High    Vasopneumatic Temperature   coldest                PT Short Term Goals - 04/21/17 1426      PT SHORT TERM GOAL #1   Title  Pt will be able to sit with knee flexion >/= 90 deg for normal tranfers and ROM .     Status  Achieved      PT SHORT TERM GOAL #2   Title  Pt will be able to walk with cane and improved, more natural motion on L knee, min noticeable limp.     Baseline  cues needed  Status  On-going      PT SHORT TERM GOAL #3   Title  Pt will be demo <10 deg quad lag in supine to demo improved quad strength.     Status  Achieved      PT SHORT TERM GOAL #4   Title  Pt will be I with HEP and report consistent stretching for max results.     Status  Achieved        PT Long Term Goals - 04/27/17 1610      PT LONG TERM GOAL #1   Title  Pt will score <50% on FOTO to demo improvement in overall functional mobility.     Baseline  8 % improved on 04/21/2017    Time  8    Period  Weeks    Status  On-going      PT LONG TERM GOAL #2   Title  Pt will demo strength in L knee 5/5 and hip at least 4+/5 in extension and abduction for normal gait.     Time  8    Period  Weeks    Status  Unable to assess      PT LONG TERM GOAL #3   Title  Pt will be able to flex L knee to 110 deg or more for maximal function and preparation for Rt. TKA.     Baseline  107 AA    Time  8    Period  Weeks    Status  Partially Met      PT LONG TERM GOAL #4   Title  Pt will be able to walk with min limp and have no increase in pain up to 45 min to 1 hour, cane if needed.     Baseline  limps,  ( back pain and knee pain)    Time  8    Period  Weeks    Status  On-going            Plan - 05/03/17 1143     Clinical Impression Statement  Pt arrives requesting only ice. After encouragement he consented to the bike and ROM exercises. He reports already completing 10 sets of squats and various other exercises this morning. We focused prone stretching into flexion and quad activation exercises. Still slight quad lag with SLR. 107 AROM flexion without strap after stretching.     PT Next Visit Plan  progress ROM , quad strength .   review step ups to HEP.  Consider terminal knee extension.  Work on decreasing limp...    PT Home Exercise Plan  gave him wall slides for LLE activation, has and does level 1-2 knee and standing march, SLR, squat anf toe raises , hip abd and hip stretching .  3 way eccentric quads on step    Consulted and Agree with Plan of Care  Patient       Patient will benefit from skilled therapeutic intervention in order to improve the following deficits and impairments:  Decreased mobility, Abnormal gait, Difficulty walking, Hypomobility, Obesity, Improper body mechanics, Increased edema, Decreased range of motion, Decreased activity tolerance, Decreased strength, Increased fascial restricitons, Impaired flexibility, Postural dysfunction, Pain, Decreased skin integrity, Decreased scar mobility, Decreased balance, Decreased knowledge of use of DME  Visit Diagnosis: Stiffness of left knee, not elsewhere classified  Localized edema  Acute pain of left knee  Muscle weakness (generalized)  Total knee replacement status, left     Problem List Patient Active Problem List  Diagnosis Date Noted  . Total knee replacement status 02/10/2017  . Chronic anticoagulation 01/26/2017  . Acute pulmonary embolus ([place])   . Primary osteoarthritis of left knee 07/28/2016  . Chest pain   . Abnormal EKG   . Pulmonary embolism ([place]) 06/03/2016  . Degenerative arthritis of right knee 08/08/2015  . Encounter for chronic pain management 04/06/2014  . Tobacco abuse 12/05/2013  . DVT (deep venous  thrombosis) ([place]) 11/28/2013  . Candidate for statin therapy due to risk of future cardiovascular event 05/05/2013  . HTN (hypertension) 05/04/2013  . Bilateral knee pain 05/04/2013  . Lumbar back pain 05/04/2013  . Healthcare maintenance 05/04/2013  . BMI 33.0-33.9,adult 05/04/2013  . Hx of substance abuse 05/04/2013    Dorene Ar, [place] 05/03/2017, 12:00 PM  [facility] [address] [place], [place], [address] Phone: [phone]   Fax:  [phone]  Name: ARUSH GATLIFF MRN: [id number] Date of Birth: 07-Feb-1966

## 2017-05-06 ENCOUNTER — Encounter: Payer: Self-pay | Admitting: Physical Therapy

## 2017-05-06 ENCOUNTER — Ambulatory Visit: Payer: 59 | Admitting: Physical Therapy

## 2017-05-06 DIAGNOSIS — M25562 Pain in left knee: Secondary | ICD-10-CM | POA: Diagnosis not present

## 2017-05-06 DIAGNOSIS — M6281 Muscle weakness (generalized): Secondary | ICD-10-CM | POA: Diagnosis not present

## 2017-05-06 DIAGNOSIS — R600 Localized edema: Secondary | ICD-10-CM

## 2017-05-06 DIAGNOSIS — M25662 Stiffness of left knee, not elsewhere classified: Secondary | ICD-10-CM | POA: Diagnosis not present

## 2017-05-06 DIAGNOSIS — Z96652 Presence of left artificial knee joint: Secondary | ICD-10-CM | POA: Diagnosis not present

## 2017-05-06 NOTE — Therapy (Signed)
[place], [place], [address] Phone: [phone]   Fax:  [phone]  Physical Therapy Treatment  Patient Details  Name: Jeffrey [name] MRN: [id number] Date of Birth: October 31, 1965 Referring Provider: Dr. Erlinda Hong    Encounter Date: 05/06/2017  PT End of Session - 05/06/17 1144    Visit Number  12    Number of Visits  24    Date for PT Re-Evaluation  05/28/17    PT Start Time  1100    PT Stop Time  1200    PT Time Calculation (min)  60 min       Past Medical History:  Diagnosis Date  . Acute pulmonary embolus (HCC)    bilateral submassive PE in setting of missing several doses of Xarelto for his DVT  . Arthritis   . DVT (deep venous thrombosis) ([place]) 11/28/2013   RT LEG  . High cholesterol   . Hypertension   . Marijuana use     Past Surgical History:  Procedure Laterality Date  . HERNIA REPAIR     6962 and 9528; umbilical hernia repair  . KNEE SURGERY Bilateral    Left knee 1993, right knee 2004  . TONSILLECTOMY    . TOTAL KNEE ARTHROPLASTY Left 02/10/2017   Procedure: LEFT TOTAL KNEE ARTHROPLASTY;  Surgeon: Leandrew Koyanagi, MD;  Location: [facility];  Service: Orthopedics;  Laterality: Left;    There were no vitals filed for this visit.  Subjective Assessment - 05/06/17 1144    Subjective  Doing good, no pain.     Currently in Pain?  No/denies                      [facility] Adult PT Treatment/Exercise - 05/06/17 0001      Knee/Hip Exercises: Aerobic   Nustep  7 minutes L8 -pt set up himself       Knee/Hip Exercises: Seated   Long Arc Quad  2 sets;10 reps;5 sets 5#    Hamstring Curl  3 sets;10 reps    Hamstring Limitations  bLUE BAND,  hep      Knee/Hip Exercises: Supine   Short Arc Quad Sets  10 reps 5#, 10 sec holds     Heel Slides  10 reps;AROM    Straight Leg Raises  1 set;10 reps with initial quad set      Vasopneumatic   Number Minutes Vasopneumatic   15 minutes    Vasopnuematic Location   Knee    Vasopneumatic Pressure  High    Vasopneumatic Temperature   coldest       Manual Therapy   Manual Therapy  Soft tissue mobilization    Manual therapy comments  Patellar creeping all planes ,  moving however not yet WNL ,      Soft tissue mobilization  IASTM vastus lateralis               PT Short Term Goals - 04/21/17 1426      PT SHORT TERM GOAL #1   Title  Pt will be able to sit with knee flexion >/= 90 deg for normal tranfers and ROM .     Status  Achieved      PT SHORT TERM GOAL #2   Title  Pt will be able to walk with cane and improved, more natural motion on L knee, min noticeable limp.     Baseline  cues needed     Status  On-going      PT SHORT TERM GOAL #3   Title  Pt will be demo <10 deg quad lag in supine to demo improved quad strength.     Status  Achieved      PT SHORT TERM GOAL #4   Title  Pt will be I with HEP and report consistent stretching for max results.     Status  Achieved        PT Long Term Goals - 04/27/17 1610      PT LONG TERM GOAL #1   Title  Pt will score <50% on FOTO to demo improvement in overall functional mobility.     Baseline  8 % improved on 04/21/2017    Time  8    Period  Weeks    Status  On-going      PT LONG TERM GOAL #2   Title  Pt will demo strength in L knee 5/5 and hip at least 4+/5 in extension and abduction for normal gait.     Time  8    Period  Weeks    Status  Unable to assess      PT LONG TERM GOAL #3   Title  Pt will be able to flex L knee to 110 deg or more for maximal function and preparation for Rt. TKA.     Baseline  107 AA    Time  8    Period  Weeks    Status  Partially Met      PT LONG TERM GOAL #4   Title  Pt will be able to walk with min limp and have no increase in pain up to 45 min to 1 hour, cane if needed.     Baseline  limps,  ( back pain and knee pain)    Time  8    Period  Weeks    Status  On-going            Plan - 05/06/17 1217    Clinical  Impression Statement  Pt arrives with no pain. He requires cues to decrease lateral trunk swing in gait. He c/o lateral fat pad pain with knee flexion and extension. Focused patella creeping mob in all planes and IASTM to vastus lateralis. Less pain with heel slides after manual. Still slight quad lag present with SLR.     PT Next Visit Plan  progress ROM , quad strength .   review step ups to HEP.  Consider terminal knee extension.  Work on decreasing limp...    PT Home Exercise Plan  gave him wall slides for LLE activation, has and does level 1-2 knee and standing march, SLR, squat anf toe raises , hip abd and hip stretching .  3 way eccentric quads on step    Consulted and Agree with Plan of Care  Patient       Patient will benefit from skilled therapeutic intervention in order to improve the following deficits and impairments:  Decreased mobility, Abnormal gait, Difficulty walking, Hypomobility, Obesity, Improper body mechanics, Increased edema, Decreased range of motion, Decreased activity tolerance, Decreased strength, Increased fascial restricitons, Impaired flexibility, Postural dysfunction, Pain, Decreased skin integrity, Decreased scar mobility, Decreased balance, Decreased knowledge of use of DME  Visit Diagnosis: Stiffness of left knee, not elsewhere classified  Localized edema  Acute pain of left knee  Muscle weakness (generalized)  Total knee replacement status, left     Problem List Patient Active Problem List   Diagnosis  Date Noted  . Total knee replacement status 02/10/2017  . Chronic anticoagulation 01/26/2017  . Acute pulmonary embolus ([place])   . Primary osteoarthritis of left knee 07/28/2016  . Chest pain   . Abnormal EKG   . Pulmonary embolism ([place]) 06/03/2016  . Degenerative arthritis of right knee 08/08/2015  . Encounter for chronic pain management 04/06/2014  . Tobacco abuse 12/05/2013  . DVT (deep venous thrombosis) ([place]) 11/28/2013  . Candidate for statin  therapy due to risk of future cardiovascular event 05/05/2013  . HTN (hypertension) 05/04/2013  . Bilateral knee pain 05/04/2013  . Lumbar back pain 05/04/2013  . Healthcare maintenance 05/04/2013  . BMI 33.0-33.9,adult 05/04/2013  . Hx of substance abuse 05/04/2013    Dorene Ar , [place] 05/06/2017, 12:40 PM  [place], [place], [address] Phone: [phone]   Fax:  [phone]  Name: Jeffrey [name] MRN: [id number] Date of Birth: Mar 23, 1966

## 2017-05-10 ENCOUNTER — Ambulatory Visit (INDEPENDENT_AMBULATORY_CARE_PROVIDER_SITE_OTHER): Payer: 59 | Admitting: Orthopaedic Surgery

## 2017-05-10 ENCOUNTER — Encounter (INDEPENDENT_AMBULATORY_CARE_PROVIDER_SITE_OTHER): Payer: Self-pay | Admitting: Orthopaedic Surgery

## 2017-05-10 DIAGNOSIS — Z96652 Presence of left artificial knee joint: Secondary | ICD-10-CM

## 2017-05-10 NOTE — Progress Notes (Signed)
Post-Op Visit Note   Patient: Jeffrey [name]           Date of Birth: [birth year]           MRN: [id number] Visit Date: 05/10/2017 PCP: Tonette Bihari, MD   Assessment & Plan:  Chief Complaint:  Chief Complaint  Patient presents with  . Left Knee - Routine Post Op   Visit Diagnoses:  1. S/P TKR (total knee replacement), left     Plan: [name] comes in for follow-up.  89 days status post left total knee replacement he is doing excellent.  He is still in outpatient physical therapy making great progress.. Absolutely no pain.  Examination of his left knee reveals a well-healed surgical incision without evidence of infection.  Calf soft nontender.  Range of motion from 0-105 degrees.  He is stable to valgus varus stress.  He is neurovascular intact distally.  At this point, would like for [name] to continue with outpatient physical therapy.  A new prescription was given.  He will follow-up with [place] in 6 weeks time for repeat evaluation and x-rays.    Follow-Up Instructions: Return in about 6 weeks (around 06/21/2017).   Orders:  No orders of the defined types were placed in this encounter.  No orders of the defined types were placed in this encounter.   Imaging: No new imaging today  PMFS History: Patient Active Problem List   Diagnosis Date Noted  . S/P TKR (total knee replacement), left 02/10/2017  . Chronic anticoagulation 01/26/2017  . Acute pulmonary embolus ([facility])   . Primary osteoarthritis of left knee 07/28/2016  . Chest pain   . Abnormal EKG   . Pulmonary embolism ([place]) 06/03/2016  . Degenerative arthritis of right knee 08/08/2015  . Encounter for chronic pain management 04/06/2014  . Tobacco abuse 12/05/2013  . DVT (deep venous thrombosis) ([facility]) 11/28/2013  . Candidate for statin therapy due to risk of future cardiovascular event 05/05/2013  . HTN (hypertension) 05/04/2013  . Bilateral knee pain 05/04/2013  . Lumbar back pain 05/04/2013  . Healthcare  maintenance 05/04/2013  . BMI 33.0-33.9,adult 05/04/2013  . Hx of substance abuse 05/04/2013   Past Medical History:  Diagnosis Date  . Acute pulmonary embolus (HCC)    bilateral submassive PE in setting of missing several doses of Xarelto for his DVT  . Arthritis   . DVT (deep venous thrombosis) ([place] [facility] [facility]) 11/28/2013   RT LEG  . High cholesterol   . Hypertension   . Marijuana use     Family History  Problem Relation Age of Onset  . Heart disease Other   . Arthritis Other   . Alcohol abuse Mother   . Heart disease Mother   . Depression Mother   . Hypertension Mother   . Kidney disease Mother   . Arthritis Mother   . Clotting disorder Mother   . Arthritis Father   . Alcohol abuse Brother   . Drug abuse Brother   . Sickle cell anemia Brother   . Arthritis Maternal Grandmother   . Heart disease Maternal Grandmother   . Arthritis Maternal Grandfather   . Heart disease Maternal Grandfather   . Asthma Cousin     Past Surgical History:  Procedure Laterality Date  . HERNIA REPAIR     1324 and 4010; umbilical hernia repair  . KNEE SURGERY Bilateral    Left knee 1993, right knee 2004  . TONSILLECTOMY    . TOTAL KNEE ARTHROPLASTY Left 02/10/2017  Procedure: LEFT TOTAL KNEE ARTHROPLASTY;  Surgeon: Leandrew Koyanagi, MD;  Location: [facility];  Service: Orthopedics;  Laterality: Left;   Social History   Occupational History  . Not on file  Tobacco Use  . Smoking status: Current Every Day Smoker    Packs/day: 0.25    Years: 35.00    Pack years: 8.75    Types: Cigarettes    Start date: 10/07/1976  . Smokeless tobacco: Never Used  . Tobacco comment: Stopped for up to 5 years total - Peak rate 2ppd  Substance and Sexual Activity  . Alcohol use: Yes    Alcohol/week: 7.2 oz    Types: 12 Cans of beer per week    Comment: seldom wine/liquor  . Drug use: Yes    Types: Marijuana, Cocaine    Comment: Cocaine last used in 2004, current marijuana use  . Sexual activity: Yes    Birth  control/protection: None    Comment: With wife

## 2017-05-11 ENCOUNTER — Ambulatory Visit: Payer: 59 | Admitting: Physical Therapy

## 2017-05-12 ENCOUNTER — Ambulatory Visit: Payer: 59 | Admitting: Physical Therapy

## 2017-05-14 ENCOUNTER — Ambulatory Visit: Payer: 59 | Admitting: Physical Therapy

## 2017-05-14 ENCOUNTER — Telehealth: Payer: Self-pay | Admitting: Physical Therapy

## 2017-05-14 NOTE — Telephone Encounter (Signed)
Pt called to check on Jeffrey [name] and let him know he did not show for 9:30 Am appt today.  And to let him know future appt at 11:00 AM on February 19. Pt was reminded of attendance policy and to let clinic know if he is not able to attend appt. And left number of clinic on message machine  Voncille Lo, PT Certified Exercise Expert for the Aging Adult  05/14/17 10:07 AM Phone: [phone] Fax: [phone]

## 2017-05-18 ENCOUNTER — Ambulatory Visit: Payer: 59 | Admitting: Physical Therapy

## 2017-05-18 DIAGNOSIS — Z96652 Presence of left artificial knee joint: Secondary | ICD-10-CM | POA: Diagnosis not present

## 2017-05-18 DIAGNOSIS — M25662 Stiffness of left knee, not elsewhere classified: Secondary | ICD-10-CM | POA: Diagnosis not present

## 2017-05-18 DIAGNOSIS — M6281 Muscle weakness (generalized): Secondary | ICD-10-CM | POA: Diagnosis not present

## 2017-05-18 DIAGNOSIS — R600 Localized edema: Secondary | ICD-10-CM

## 2017-05-18 DIAGNOSIS — M25562 Pain in left knee: Secondary | ICD-10-CM

## 2017-05-18 NOTE — Therapy (Signed)
[place], [place], [address] Phone: [phone]   Fax:  [phone]  Physical Therapy Treatment  Patient Details  Name: Jeffrey [name] MRN: [id number] Date of Birth: [birth year] Referring Provider: Dr. Erlinda Hong    Encounter Date: 05/18/2017  PT End of Session - 05/18/17 1249    Visit Number  13    Number of Visits  24    Date for PT Re-Evaluation  05/28/17    PT Start Time  1058    PT Stop Time  1158    PT Time Calculation (min)  60 min    Activity Tolerance  Patient tolerated treatment well    Behavior During Therapy  [facility] for tasks assessed/performed       Past Medical History:  Diagnosis Date  . Acute pulmonary embolus (HCC)    bilateral submassive PE in setting of missing several doses of Xarelto for his DVT  . Arthritis   . DVT (deep venous thrombosis) ([facility]) 11/28/2013   RT LEG  . High cholesterol   . Hypertension   . Marijuana use     Past Surgical History:  Procedure Laterality Date  . HERNIA REPAIR     6389 and 3734; umbilical hernia repair  . KNEE SURGERY Bilateral    Left knee 1993, right knee 2004  . TONSILLECTOMY    . TOTAL KNEE ARTHROPLASTY Left 02/10/2017   Procedure: LEFT TOTAL KNEE ARTHROPLASTY;  Surgeon: Leandrew Koyanagi, MD;  Location: [facility];  Service: Orthopedics;  Laterality: Left;    There were no vitals filed for this visit.      [facility] PT Assessment - 05/18/17 0001      AROM   Left Knee Flexion  93      Strength   Right Knee Flexion  5/5    Right Knee Extension  5/5    Left Knee Flexion  5/5    Left Knee Extension  4+/5         [facility] Adult PT Treatment/Exercise - 05/18/17 0001      Knee/Hip Exercises: Stretches   Active Hamstring Stretch  3 reps;30 seconds    Knee: Self-Stretch to increase Flexion  Left;5 reps      Knee/Hip Exercises: Aerobic   Stationary Bike  5 min difficulty getting full revolution       Knee/Hip Exercises: Standing   Hip Extension   Stengthening;Both;1 set;10 reps    Functional Squat  2 sets;10 reps narrow and sumo (hip abd and ER)     SLS with Vectors  flex, abd, ext x 10 1 UE support at countertop each leg       Knee/Hip Exercises: Supine   Straight Leg Raises  Strengthening;Both;2 sets;10 reps 2 sets on L      Knee/Hip Exercises: Sidelying   Hip ABduction  Strengthening;Both;2 sets;10 reps      Vasopneumatic   Number Minutes Vasopneumatic   15 minutes    Vasopnuematic Location   Knee    Vasopneumatic Pressure  Medium    Vasopneumatic Temperature   coldest              PT Education - 05/18/17 1249    Education provided  Yes    Education Details  progress, ROM, strength measurements     Person(s) Educated  Patient    Methods  Explanation;Handout;Demonstration    Comprehension  Verbalized understanding;Returned demonstration;Verbal cues required       PT Short Term Goals - 05/18/17  1106      PT SHORT TERM GOAL #1   Title  Pt will be able to sit with knee flexion >/= 90 deg for normal tranfers and ROM .     Status  Achieved      PT SHORT TERM GOAL #2   Title  Pt will be able to walk with cane and improved, more natural motion on L knee, min noticeable limp.     Baseline  walks without cane but significant limp , pain in R LE     Period  Weeks    Status  On-going      PT SHORT TERM GOAL #3   Title  Pt will be demo <10 deg quad lag in supine to demo improved quad strength.     Baseline  15 deg     Status  Achieved      PT SHORT TERM GOAL #4   Title  Pt will be I with HEP and report consistent stretching for max results.     Baseline  has not been as consistent due to recent personal stressors    Status  Achieved        PT Long Term Goals - 05/18/17 1107      PT LONG TERM GOAL #1   Title  Pt will score <50% on FOTO to demo improvement in overall functional mobility.     Baseline  8 % improved on 04/21/2017    Status  On-going      PT LONG TERM GOAL #2   Title  Pt will demo strength in  L knee 5/5 and hip at least 4+/5 in extension and abduction for normal gait.     Status  Partially Met      PT LONG TERM GOAL #3   Title  Pt will be able to flex L knee to 110 deg or more for maximal function and preparation for Rt. TKA.     Baseline  102 deg AAROM     Status  On-going      PT LONG TERM GOAL #4   Title  Pt will be able to walk with min limp and have no increase in pain up to 45 min to 1 hour, cane if needed.     Status  On-going            Plan - 05/18/17 1251    Clinical Impression Statement  Patient recently under alot of emotional stress.  He has good knee strength but lacks hip and core strength.  ROM has decreased to about 102 with AAROM.  He wants to be strong enough to undergo Rt TKA.  He admits to walking with a significant limp prior to surgery so has learned bad habits. He will cont to benefit from 2 more weeks of PT and then reassess for cont PT.  Reinforced attendance policy.     PT Treatment/Interventions  ADLs/Self Care Home Management;Cryotherapy;Ultrasound;Gait training;Stair training;Balance training;Manual lymph drainage;Dry needling;Passive range of motion;Manual techniques;Patient/family education;Therapeutic exercise;DME Instruction;Electrical Stimulation;Functional mobility training;Therapeutic activities;Neuromuscular re-education;Taping;Vasopneumatic Device    PT Next Visit Plan  progress ROM , hip/core, strength/TKE Work on decreasing limp...    PT Home Exercise Plan  gave him wall slides for LLE activation, has and does level 1-2 knee and standing march, SLR, squat anf toe raises , hip abd and hip stretching .  3 way eccentric quads on step    Consulted and Agree with Plan of Care  Patient  Patient will benefit from skilled therapeutic intervention in order to improve the following deficits and impairments:  Decreased mobility, Abnormal gait, Difficulty walking, Hypomobility, Obesity, Improper body mechanics, Increased edema, Decreased range  of motion, Decreased activity tolerance, Decreased strength, Increased fascial restricitons, Impaired flexibility, Postural dysfunction, Pain, Decreased skin integrity, Decreased scar mobility, Decreased balance, Decreased knowledge of use of DME  Visit Diagnosis: Stiffness of left knee, not elsewhere classified  Localized edema  Acute pain of left knee  Muscle weakness (generalized)  Total knee replacement status, left     Problem List Patient Active Problem List   Diagnosis Date Noted  . S/P TKR (total knee replacement), left 02/10/2017  . Chronic anticoagulation 01/26/2017  . Acute pulmonary embolus ([place])   . Primary osteoarthritis of left knee 07/28/2016  . Chest pain   . Abnormal EKG   . Pulmonary embolism ([place]) 06/03/2016  . Degenerative arthritis of right knee 08/08/2015  . Encounter for chronic pain management 04/06/2014  . Tobacco abuse 12/05/2013  . DVT (deep venous thrombosis) ([place]) 11/28/2013  . Candidate for statin therapy due to risk of future cardiovascular event 05/05/2013  . HTN (hypertension) 05/04/2013  . Bilateral knee pain 05/04/2013  . Lumbar back pain 05/04/2013  . Healthcare maintenance 05/04/2013  . BMI 33.0-33.9,adult 05/04/2013  . Hx of substance abuse 05/04/2013    Carol Theys 05/18/2017, 1:01 PM  [facility] [address] [place], [place], [address] Phone: [phone]   Fax:  [phone]  Name: CAP MASSI MRN: [id number] Date of Birth: Jan 13, 1966  Raeford Razor, PT 05/18/17 1:02 PM Phone: [phone] Fax: [phone]

## 2017-05-18 NOTE — Patient Instructions (Signed)
Bridging    Slowly raise buttocks from floor, keeping stomach tight. Repeat _10___ times per set. Do ___2_ sets per session. Do ___2_ sessions per day.  http://orth.exer.us/1096   Copyright  VHI. All rights reserved.    Abduction: Side Leg Lift (Eccentric) - Side-Lying    Lie on side. Lift top leg slightly higher than shoulder level. Keep top leg straight with body, toes pointing forward. Slowly lower for 3-5 seconds. __10_ reps per set, __2-3_ sets per day, __5_ days per week.  http://ecce.exer.us/62   Copyright  VHI. All rights reserved.

## 2017-05-20 ENCOUNTER — Encounter: Payer: Self-pay | Admitting: Physical Therapy

## 2017-05-20 ENCOUNTER — Ambulatory Visit: Payer: 59 | Admitting: Physical Therapy

## 2017-05-20 DIAGNOSIS — M25662 Stiffness of left knee, not elsewhere classified: Secondary | ICD-10-CM | POA: Diagnosis not present

## 2017-05-20 DIAGNOSIS — M25562 Pain in left knee: Secondary | ICD-10-CM | POA: Diagnosis not present

## 2017-05-20 DIAGNOSIS — R600 Localized edema: Secondary | ICD-10-CM | POA: Diagnosis not present

## 2017-05-20 DIAGNOSIS — M6281 Muscle weakness (generalized): Secondary | ICD-10-CM | POA: Diagnosis not present

## 2017-05-20 DIAGNOSIS — Z96652 Presence of left artificial knee joint: Secondary | ICD-10-CM | POA: Diagnosis not present

## 2017-05-20 NOTE — Therapy (Signed)
[place], [place], [address] Phone: [phone]   Fax:  [phone]  Physical Therapy Treatment  Patient Details  Name: Jeffrey [name] MRN: [id number] Date of Birth: 1965/08/06 Referring Provider: Dr. Erlinda Hong    Encounter Date: 05/20/2017  PT End of Session - 05/20/17 0848    Visit Number  14    Number of Visits  24    Date for PT Re-Evaluation  05/28/17    PT Start Time  0843    PT Stop Time  0940    PT Time Calculation (min)  57 min       Past Medical History:  Diagnosis Date  . Acute pulmonary embolus (HCC)    bilateral submassive PE in setting of missing several doses of Xarelto for his DVT  . Arthritis   . DVT (deep venous thrombosis) ([place]) 11/28/2013   RT LEG  . High cholesterol   . Hypertension   . Marijuana use     Past Surgical History:  Procedure Laterality Date  . HERNIA REPAIR     6962 and 9528; umbilical hernia repair  . KNEE SURGERY Bilateral    Left knee 1993, right knee 2004  . TONSILLECTOMY    . TOTAL KNEE ARTHROPLASTY Left 02/10/2017   Procedure: LEFT TOTAL KNEE ARTHROPLASTY;  Surgeon: Leandrew Koyanagi, MD;  Location: [facility];  Service: Orthopedics;  Laterality: Left;    There were no vitals filed for this visit.  Subjective Assessment - 05/20/17 0847    Subjective  No pain.     Currently in Pain?  No/denies    Aggravating Factors   weather, lifting too much     Pain Relieving Factors  change positions                      [facility] Adult PT Treatment/Exercise - 05/20/17 0001      Knee/Hip Exercises: Aerobic   Nustep  7 minutes L10 -pt set up himself       Knee/Hip Exercises: Supine   Bridges  10 reps;2 sets    Bridges with [employer]  10 reps    Bridges with Clamshell  10 reps blue    Straight Leg Raises  Strengthening;Both;2 sets;10 reps 2 sets on L    Other Supine Knee/Hip Exercises  blue band clams x 20 , ball squeeze x 20 , both with abdominal draw in        Knee/Hip Exercises: Sidelying   Hip ABduction  Strengthening;Both;2 sets;10 reps      Knee/Hip Exercises: Prone   Other Prone Exercises  knee flexion 5 x 30 sec with strap       Vasopneumatic   Number Minutes Vasopneumatic   15 minutes    Vasopnuematic Location   Knee    Vasopneumatic Pressure  Medium    Vasopneumatic Temperature   coldest               PT Short Term Goals - 05/18/17 1106      PT SHORT TERM GOAL #1   Title  Pt will be able to sit with knee flexion >/= 90 deg for normal tranfers and ROM .     Status  Achieved      PT SHORT TERM GOAL #2   Title  Pt will be able to walk with cane and improved, more natural motion on L knee, min noticeable limp.     Baseline  walks without cane  but significant limp , pain in R LE     Period  Weeks    Status  On-going      PT SHORT TERM GOAL #3   Title  Pt will be demo <10 deg quad lag in supine to demo improved quad strength.     Baseline  15 deg     Status  Achieved      PT SHORT TERM GOAL #4   Title  Pt will be I with HEP and report consistent stretching for max results.     Baseline  has not been as consistent due to recent personal stressors    Status  Achieved        PT Long Term Goals - 05/18/17 1107      PT LONG TERM GOAL #1   Title  Pt will score <50% on FOTO to demo improvement in overall functional mobility.     Baseline  8 % improved on 04/21/2017    Status  On-going      PT LONG TERM GOAL #2   Title  Pt will demo strength in L knee 5/5 and hip at least 4+/5 in extension and abduction for normal gait.     Status  Partially Met      PT LONG TERM GOAL #3   Title  Pt will be able to flex L knee to 110 deg or more for maximal function and preparation for Rt. TKA.     Baseline  102 deg AAROM     Status  On-going      PT LONG TERM GOAL #4   Title  Pt will be able to walk with min limp and have no increase in pain up to 45 min to 1 hour, cane if needed.     Status  On-going             Plan - 05/20/17 2694    Clinical Impression Statement  Slight quad lag however improved. Focued hip and core stabilization today. Cues required frequently for abdominal recruitment. Prone knee flexion stretching for ROM. Pt reports 4/10 pain prior to Vaso.     PT Next Visit Plan  progress ROM , hip/core, strength/TKE Work on decreasing limp...    PT Home Exercise Plan  gave him wall slides for LLE activation, has and does level 1-2 knee and standing march, SLR, squat anf toe raises , hip abd and hip stretching .  3 way eccentric quads on step    Consulted and Agree with Plan of Care  Patient       Patient will benefit from skilled therapeutic intervention in order to improve the following deficits and impairments:  Decreased mobility, Abnormal gait, Difficulty walking, Hypomobility, Obesity, Improper body mechanics, Increased edema, Decreased range of motion, Decreased activity tolerance, Decreased strength, Increased fascial restricitons, Impaired flexibility, Postural dysfunction, Pain, Decreased skin integrity, Decreased scar mobility, Decreased balance, Decreased knowledge of use of DME  Visit Diagnosis: Stiffness of left knee, not elsewhere classified  Localized edema  Acute pain of left knee  Muscle weakness (generalized)     Problem List Patient Active Problem List   Diagnosis Date Noted  . S/P TKR (total knee replacement), left 02/10/2017  . Chronic anticoagulation 01/26/2017  . Acute pulmonary embolus ([facility])   . Primary osteoarthritis of left knee 07/28/2016  . Chest pain   . Abnormal EKG   . Pulmonary embolism ([facility]) 06/03/2016  . Degenerative arthritis of right knee 08/08/2015  . Encounter for chronic  pain management 04/06/2014  . Tobacco abuse 12/05/2013  . DVT (deep venous thrombosis) ([facility]) 11/28/2013  . Candidate for statin therapy due to risk of future cardiovascular event 05/05/2013  . HTN (hypertension) 05/04/2013  . Bilateral knee pain 05/04/2013   . Lumbar back pain 05/04/2013  . Healthcare maintenance 05/04/2013  . BMI 33.0-33.9,adult 05/04/2013  . Hx of substance abuse 05/04/2013    Dorene Ar, [place] 05/20/2017, 9:29 AM  [place], [place], [address] Phone: [phone]   Fax:  [phone]  Name: IZAIAH TABB MRN: [id number] Date of Birth: 03-07-1966

## 2017-05-25 ENCOUNTER — Ambulatory Visit: Payer: 59 | Admitting: Physical Therapy

## 2017-05-26 ENCOUNTER — Ambulatory Visit: Payer: 59 | Admitting: Physical Therapy

## 2017-05-27 ENCOUNTER — Ambulatory Visit: Payer: 59 | Admitting: Physical Therapy

## 2017-06-01 ENCOUNTER — Encounter: Payer: Self-pay | Admitting: Physical Therapy

## 2017-06-01 ENCOUNTER — Ambulatory Visit: Payer: 59 | Attending: Orthopaedic Surgery | Admitting: Physical Therapy

## 2017-06-01 DIAGNOSIS — M25562 Pain in left knee: Secondary | ICD-10-CM | POA: Diagnosis not present

## 2017-06-01 DIAGNOSIS — Z96652 Presence of left artificial knee joint: Secondary | ICD-10-CM

## 2017-06-01 DIAGNOSIS — M25662 Stiffness of left knee, not elsewhere classified: Secondary | ICD-10-CM | POA: Diagnosis not present

## 2017-06-01 DIAGNOSIS — M6281 Muscle weakness (generalized): Secondary | ICD-10-CM

## 2017-06-01 DIAGNOSIS — R600 Localized edema: Secondary | ICD-10-CM | POA: Diagnosis not present

## 2017-06-01 NOTE — Therapy (Signed)
[place], [place], [address] Phone: [phone]   Fax:  [phone]  Physical Therapy Treatment  Patient Details  Name: Jeffrey [name] MRN: [id number] Date of Birth: Jul 27, 1965 Referring Provider: Dr. Erlinda Hong    Encounter Date: 06/01/2017  PT End of Session - 06/01/17 1825    Visit Number  15    Number of Visits  24    Date for PT Re-Evaluation  05/28/17    PT Start Time  1418    PT Stop Time  1510    PT Time Calculation (min)  52 min    Activity Tolerance  Patient tolerated treatment well    Behavior During Therapy  [facility] for tasks assessed/performed       Past Medical History:  Diagnosis Date  . Acute pulmonary embolus (HCC)    bilateral submassive PE in setting of missing several doses of Xarelto for his DVT  . Arthritis   . DVT (deep venous thrombosis) ([facility]) 11/28/2013   RT LEG  . High cholesterol   . Hypertension   . Marijuana use     Past Surgical History:  Procedure Laterality Date  . HERNIA REPAIR     3244 and 0102; umbilical hernia repair  . KNEE SURGERY Bilateral    Left knee 1993, right knee 2004  . TONSILLECTOMY    . TOTAL KNEE ARTHROPLASTY Left 02/10/2017   Procedure: LEFT TOTAL KNEE ARTHROPLASTY;  Surgeon: Leandrew Koyanagi, MD;  Location: [facility];  Service: Orthopedics;  Laterality: Left;    There were no vitals filed for this visit.  Subjective Assessment - 06/01/17 1819    Subjective  No pain really.  I want to work on range and hip strength.    Currently in Pain?  No/denies    Pain Score  -- up to 5/10    Pain Location  Knee    Pain Orientation  Left;Anterior;Lateral    Pain Descriptors / Indicators  Aching    Pain Frequency  Intermittent    Aggravating Factors   weather,  end range stretches    Pain Relieving Factors  ice    Effect of Pain on Daily Activities  squatting difficult steps difficult                      [facility] Adult PT Treatment/Exercise -  06/01/17 0001      Knee/Hip Exercises: Stretches   Sports administrator  -- 10 x 10 second hold step stretch,  cues       Knee/Hip Exercises: Standing   Functional Squat  10 reps mod cues, pole used for back posture      Knee/Hip Exercises: Seated   Sit to [employer]  10 reps;Other (comment) 10 + with yellow ball squeeze, different heights mat.Hard      Knee/Hip Exercises: Supine   Single Leg Bridge  10 reps HEP,  both      Knee/Hip Exercises: Sidelying   Clams  10 each HEP,  no band min assist initially      Knee/Hip Exercises: Prone   Other Prone Exercises  quadriped  knees and elbows:  straight leg lift and bent knee lifts 10 x each.  Mod cues for technique,  difficult.  HEP      Cryotherapy   Number Minutes Cryotherapy  10 Minutes    Cryotherapy Location  Knee    Type of Cryotherapy  -- cold pack  PT Education - 06/01/17 1825    Education provided  Yes    Education Details  HEP    Person(s) Educated  Patient    Methods  Explanation;Demonstration;Tactile cues;Verbal cues;Handout    Comprehension  Verbalized understanding;Returned demonstration       PT Short Term Goals - 06/01/17 1829      PT SHORT TERM GOAL #1   Title  Pt will be able to sit with knee flexion >/= 90 deg for normal tranfers and ROM .     Time  4    Period  Weeks    Status  Achieved      PT SHORT TERM GOAL #2   Title  Pt will be able to walk with cane and improved, more natural motion on L knee, min noticeable limp.     Baseline  walks without a cane with limp    Time  4    Period  Weeks    Status  On-going      PT SHORT TERM GOAL #3   Title  Pt will be demo <10 deg quad lag in supine to demo improved quad strength.     Status  Achieved      PT SHORT TERM GOAL #4   Title  Pt will be I with HEP and report consistent stretching for max results.     Time  4    Period  Weeks    Status  Achieved        PT Long Term Goals - 06/01/17 1830      PT LONG TERM GOAL #1   Title  Pt will  score <50% on FOTO to demo improvement in overall functional mobility.     Time  8    Period  Weeks    Status  Unable to assess      PT LONG TERM GOAL #2   Title  Pt will demo strength in L knee 5/5 and hip at least 4+/5 in extension and abduction for normal gait.     Baseline  working on hip strength with   New HEP,  endurance limited    Time  8    Period  Weeks    Status  On-going      PT LONG TERM GOAL #3   Title  Pt will be able to flex L knee to 110 deg or more for maximal function and preparation for Rt. TKA.     Baseline  Not yet    Time  8    Period  Weeks    Status  On-going      PT LONG TERM GOAL #4   Title  Pt will be able to walk with min limp and have no increase in pain up to 45 min to 1 hour, cane if needed.     Baseline  duration not checked,  has min  limp with cues / moderate limp if no cues    Time  8    Period  Weeks    Status  On-going            Plan - 06/01/17 1826    Clinical Impression Statement  Patient had 4/10 pain knee after exercise prior to cold pack.  Session focused on ROM and hip strength.  Patient was unable to keepp recumbant bike running for a consecutive 5 minutes due to shortness of breath ([place])    Patient worked hard today with challanging exercises.  He will be sore  tomorrow.     PT Next Visit Plan  progress ROM ,Review hip  strengthening/core, strength/TKE Work on decreasing limp...    PT Home Exercise Plan  gave him wall slides for LLE activation, has and does level 1-2 knee and standing march, SLR, squat anf toe raises , hip abd and hip stretching .  3 way eccentric quads on step    JOSPT hip strengthening.    Consulted and Agree with Plan of Care  Patient       Patient will benefit from skilled therapeutic intervention in order to improve the following deficits and impairments:     Visit Diagnosis: Stiffness of left knee, not elsewhere classified  Localized edema  Acute pain of left knee  Muscle weakness  (generalized)  Total knee replacement status, left     Problem List Patient Active Problem List   Diagnosis Date Noted  . S/P TKR (total knee replacement), left 02/10/2017  . Chronic anticoagulation 01/26/2017  . Acute pulmonary embolus ([facility])   . Primary osteoarthritis of left knee 07/28/2016  . Chest pain   . Abnormal EKG   . Pulmonary embolism ([facility]) 06/03/2016  . Degenerative arthritis of right knee 08/08/2015  . Encounter for chronic pain management 04/06/2014  . Tobacco abuse 12/05/2013  . DVT (deep venous thrombosis) ([facility]) 11/28/2013  . Candidate for statin therapy due to risk of future cardiovascular event 05/05/2013  . HTN (hypertension) 05/04/2013  . Bilateral knee pain 05/04/2013  . Lumbar back pain 05/04/2013  . Healthcare maintenance 05/04/2013  . BMI 33.0-33.9,adult 05/04/2013  . Hx of substance abuse 05/04/2013    [facility] PTA 06/01/2017, 6:33 PM  [place], [place], [address] Phone: [phone]   Fax:  [phone]  Name: JHAIR WITHERINGTON MRN: [id number] Date of Birth: 08-09-1965

## 2017-06-01 NOTE — Patient Instructions (Signed)
Issued from ex drawer:  Hip strength JOSPT hip strengthening  All Issued\10 x each Every other day

## 2017-06-03 ENCOUNTER — Ambulatory Visit: Payer: 59 | Admitting: Physical Therapy

## 2017-06-04 ENCOUNTER — Telehealth: Payer: Self-pay | Admitting: Physical Therapy

## 2017-06-04 ENCOUNTER — Ambulatory Visit: Payer: 59 | Admitting: Physical Therapy

## 2017-06-04 ENCOUNTER — Encounter: Payer: 59 | Admitting: Physical Therapy

## 2017-06-04 NOTE — Telephone Encounter (Signed)
Called patient regarding his missed appt which was rescheduled to a PT slot for re-evaluation purposes.  He overslept.  He plans to call Monday to schedule.  Per our attendance policy he will only be allowed to schedule 1 appt at a time.  Raeford Razor, PT 06/04/17 10:46 AM Phone: [phone] Fax: [phone]

## 2017-06-08 ENCOUNTER — Other Ambulatory Visit: Payer: Self-pay | Admitting: Internal Medicine

## 2017-06-08 MED FILL — tiZANidine HCL 4 MG TABS: 4 | 7 days supply | Qty: 30 | Fill #2

## 2017-06-08 MED FILL — ELIQUIS 5 MG TABLET: 5 | 30 days supply | Qty: 60 | Fill #3

## 2017-06-10 MED FILL — ATORVASTATIN 40 MG TABLET: 40 | 30 days supply | Qty: 30 | Fill #0

## 2017-06-10 MED FILL — traMADol HCL 50 MG TABS: 50 | 30 days supply | Qty: 90 | Fill #0

## 2017-06-15 ENCOUNTER — Ambulatory Visit: Payer: 59 | Admitting: Physical Therapy

## 2017-06-16 ENCOUNTER — Ambulatory Visit: Payer: 59 | Admitting: Physical Therapy

## 2017-06-21 ENCOUNTER — Ambulatory Visit (INDEPENDENT_AMBULATORY_CARE_PROVIDER_SITE_OTHER): Payer: 59

## 2017-06-21 ENCOUNTER — Encounter (INDEPENDENT_AMBULATORY_CARE_PROVIDER_SITE_OTHER): Payer: Self-pay | Admitting: Orthopaedic Surgery

## 2017-06-21 ENCOUNTER — Ambulatory Visit (INDEPENDENT_AMBULATORY_CARE_PROVIDER_SITE_OTHER): Payer: 59 | Admitting: Orthopaedic Surgery

## 2017-06-21 DIAGNOSIS — Z96652 Presence of left artificial knee joint: Secondary | ICD-10-CM

## 2017-06-21 DIAGNOSIS — M1711 Unilateral primary osteoarthritis, right knee: Secondary | ICD-10-CM

## 2017-06-21 DIAGNOSIS — M25572 Pain in left ankle and joints of left foot: Secondary | ICD-10-CM

## 2017-06-21 NOTE — Progress Notes (Signed)
Office Visit Note   Patient: Jeffrey [name]           Date of Birth: 01/08/1966           MRN: [id number] Visit Date: 06/21/2017              Requested by: Tonette Bihari, [address] [place], [place] [address] PCP: Tonette Bihari, MD   Assessment & Plan: Visit Diagnoses:  1. S/P TKR (total knee replacement), left   2. Pain in left ankle and joints of left foot   3. Unilateral primary osteoarthritis, right knee     Plan: Impression is #1 status post left total knee replacement doing well.  #2 left ankle sprain.  At this point, we will place patient in an ASO brace.  He will be weightbearing as tolerated.  He will ice and elevate for swelling.  In regards to the right knee, we will go ahead and schedule him for a total knee replacement.  Risks benefits and possible locations reviewed.  Rehab care discussed.  All questions were answered.  He will call with concerns or questions in the meantime.  Follow-Up Instructions: Return in about 2 months (around 08/21/2017).   Orders:  Orders Placed This Encounter  Procedures  . XR KNEE 3 VIEW LEFT  . XR Ankle Complete Left   No orders of the defined types were placed in this encounter.     Procedures: No procedures performed   Clinical Data: No additional findings.   Subjective: Chief Complaint  Patient presents with  . Left Knee - Pain    HPI patient comes in for follow-up.  4 months status post left total knee replacement, date of surgery 02/10/2017.  He has been doing fairly well.  He is occasionally going to [facility] outpatient physical therapy, but states that they are so booked so he is not going as often as he would like.  Today, as he was leaving his house to come to his appointment, a board from beneath his left foot slipped causing him to invert the left ankle.  He is having pain to the entire ankle worse with ambulation.  No numbness tingling burning.  He also mentions that he is ready to schedule his right  total knee replacement.  History of osteoarthritis there.  He has tried and failed conservative treatment options and would like to proceed with definitive treatment.   Review of Systems as detailed in HPI.  All others are negative.   Objective: Vital Signs: There were no vitals taken for this visit.  Physical Exam well-developed well-nourished gentleman in no acute distress.  Alert and oriented x3.  Ortho Exam examination of his left knee reveals well-healed surgical incision without evidence of infection.  Range of motion 0-100 degrees.  He has stable valgus varus stress.  Right knee has range of motion from 0-115 degrees.  Marked patellofemoral crepitus.  Medial and lateral joint line tenderness.  Left foot/ankle has diffuse tenderness throughout.  Negative anterior drawer and talar tilt.  Full range of motion with minimal pain.  Specialty Comments:  No specialty comments available.  Imaging: Xr Ankle Complete Left  Result Date: 06/21/2017 X-rays of the left ankle show marked degenerative changes throughout the ankle and foot.  No acute structural abnormality  Xr Knee 3 View Left  Result Date: 06/21/2017 X-rays of the left knee show well-seated prosthesis    PMFS History: Patient Active Problem List   Diagnosis Date Noted  .  Pain in left ankle and joints of left foot 06/21/2017  . S/P TKR (total knee replacement), left 02/10/2017  . Chronic anticoagulation 01/26/2017  . Acute pulmonary embolus ([place])   . Primary osteoarthritis of left knee 07/28/2016  . Chest pain   . Abnormal EKG   . Pulmonary embolism ([place]) 06/03/2016  . Unilateral primary osteoarthritis, right knee 08/08/2015  . Encounter for chronic pain management 04/06/2014  . Tobacco abuse 12/05/2013  . DVT (deep venous thrombosis) ([place]) 11/28/2013  . Candidate for statin therapy due to risk of future cardiovascular event 05/05/2013  . HTN (hypertension) 05/04/2013  . Bilateral knee pain 05/04/2013  . Lumbar back  pain 05/04/2013  . Healthcare maintenance 05/04/2013  . BMI 33.0-33.9,adult 05/04/2013  . Hx of substance abuse 05/04/2013   Past Medical History:  Diagnosis Date  . Acute pulmonary embolus (HCC)    bilateral submassive PE in setting of missing several doses of Xarelto for his DVT  . Arthritis   . DVT (deep venous thrombosis) ([place]) 11/28/2013   RT LEG  . High cholesterol   . Hypertension   . Marijuana use     Family History  Problem Relation Age of Onset  . Heart disease Other   . Arthritis Other   . Alcohol abuse Mother   . Heart disease Mother   . Depression Mother   . Hypertension Mother   . Kidney disease Mother   . Arthritis Mother   . Clotting disorder Mother   . Arthritis Father   . Alcohol abuse Brother   . Drug abuse Brother   . Sickle cell anemia Brother   . Arthritis Maternal Grandmother   . Heart disease Maternal Grandmother   . Arthritis Maternal Grandfather   . Heart disease Maternal Grandfather   . Asthma Cousin     Past Surgical History:  Procedure Laterality Date  . HERNIA REPAIR     4967 and 5916; umbilical hernia repair  . KNEE SURGERY Bilateral    Left knee 1993, right knee 2004  . TONSILLECTOMY    . TOTAL KNEE ARTHROPLASTY Left 02/10/2017   Procedure: LEFT TOTAL KNEE ARTHROPLASTY;  Surgeon: Leandrew Koyanagi, MD;  Location: [facility];  Service: Orthopedics;  Laterality: Left;   Social History   Occupational History  . Not on file  Tobacco Use  . Smoking status: Current Every Day Smoker    Packs/day: 0.25    Years: 35.00    Pack years: 8.75    Types: Cigarettes    Start date: 10/07/1976  . Smokeless tobacco: Never Used  . Tobacco comment: Stopped for up to 5 years total - Peak rate 2ppd  Substance and Sexual Activity  . Alcohol use: Yes    Alcohol/week: 7.2 oz    Types: 12 Cans of beer per week    Comment: seldom wine/liquor  . Drug use: Yes    Types: Marijuana, Cocaine    Comment: Cocaine last used in 2004, current marijuana use  . Sexual  activity: Yes    Birth control/protection: None    Comment: With wife

## 2017-07-01 ENCOUNTER — Other Ambulatory Visit (INDEPENDENT_AMBULATORY_CARE_PROVIDER_SITE_OTHER): Payer: Self-pay

## 2017-07-07 ENCOUNTER — Ambulatory Visit: Payer: 59 | Admitting: Adult Health

## 2017-07-16 NOTE — Pre-Procedure Instructions (Signed)
Jeffrey [name]  07/16/2017      [place], [place] - [place] [address] Phone: [phone] Fax: [phone]    Your procedure is scheduled on Wednesday May 1.  Report to [facility] Admitting at 10:45 A.M.  Call this number if you have problems the morning of surgery:  [phone]   Remember:  Do not eat food or drink liquids after midnight.  Take these medicines the morning of surgery with A SIP OF WATER:   Amlodipine (norvasc) Albuterol if needed Tizanidine (zanaflex) if needed Tramadol (ultram) if needed  7 days prior to surgery STOP taking any Aspirin(unless otherwise instructed by your surgeon), Aleve, Naproxen, Ibuprofen, Motrin, Advil, Goody's, BC's, all herbal medications, fish oil, and all vitamins  **Follow your surgeon's instructions on stopping Eliquis Ardeen Jourdain). If no instructions were given, please call your surgeon's office**   Do not wear jewelry, make-up or nail polish.  Do not wear lotions, powders, or perfumes, or deodorant.  Do not shave 48 hours prior to surgery.  Men may shave face and neck.  Do not bring valuables to the hospital.  [facility] is not responsible for any belongings or valuables.  Contacts, dentures or bridgework may not be worn into surgery.  Leave your suitcase in the car.  After surgery it may be brought to your room.  For patients admitted to the hospital, discharge time will be determined by your treatment team.  Patients discharged the day of surgery will not be allowed to drive home.   Special instructions:    [facility]- Preparing For Surgery  Before surgery, you can play an important role. Because skin is not sterile, your skin needs to be as free of germs as possible. You can reduce the number of germs on your skin by washing with CHG (chlorahexidine gluconate) Soap before surgery.  CHG is an antiseptic cleaner which kills germs  and bonds with the skin to continue killing germs even after washing.  Please do not use if you have an allergy to CHG or antibacterial soaps. If your skin becomes reddened/irritated stop using the CHG.  Do not shave (including legs and underarms) for at least 48 hours prior to first CHG shower. It is OK to shave your face.  Please follow these instructions carefully.   1. Shower the NIGHT BEFORE SURGERY and the MORNING OF SURGERY with CHG.   2. If you chose to wash your hair, wash your hair first as usual with your normal shampoo.  3. After you shampoo, rinse your hair and body thoroughly to remove the shampoo.  4. Use CHG as you would any other liquid soap. You can apply CHG directly to the skin and wash gently with a scrungie or a clean washcloth.   5. Apply the CHG Soap to your body ONLY FROM THE NECK DOWN.  Do not use on open wounds or open sores. Avoid contact with your eyes, ears, mouth and genitals (private parts). Wash Face and genitals (private parts)  with your normal soap.  6. Wash thoroughly, paying special attention to the area where your surgery will be performed.  7. Thoroughly rinse your body with warm water from the neck down.  8. DO NOT shower/wash with your normal soap after using and rinsing off the CHG Soap.  9. Pat yourself dry with a CLEAN TOWEL.  10. Wear CLEAN PAJAMAS to bed the night before surgery, wear  comfortable clothes the morning of surgery  11. Place CLEAN SHEETS on your bed the night of your first shower and DO NOT SLEEP WITH PETS.    Day of Surgery: Do not apply any deodorants/lotions. Please wear clean clothes to the hospital/surgery center.      Please read over the following fact sheets that you were given. Coughing and Deep Breathing, Total Joint Packet, MRSA Information and Surgical Site Infection Prevention

## 2017-07-19 ENCOUNTER — Telehealth (INDEPENDENT_AMBULATORY_CARE_PROVIDER_SITE_OTHER): Payer: Self-pay | Admitting: Orthopaedic Surgery

## 2017-07-19 ENCOUNTER — Encounter (HOSPITAL_COMMUNITY)
Admission: RE | Admit: 2017-07-19 | Discharge: 2017-07-19 | Disposition: A | Discharge status: Home / Self Care | Payer: 59 | Source: Ambulatory Visit | Attending: Orthopaedic Surgery | Admitting: Orthopaedic Surgery

## 2017-07-19 ENCOUNTER — Ambulatory Visit (HOSPITAL_COMMUNITY)
Admission: RE | Admit: 2017-07-19 | Discharge: 2017-07-19 | Disposition: A | Discharge status: Home / Self Care | Payer: 59 | Source: Ambulatory Visit | Attending: Physician Assistant | Admitting: Physician Assistant

## 2017-07-19 ENCOUNTER — Encounter (HOSPITAL_COMMUNITY): Payer: Self-pay

## 2017-07-19 DIAGNOSIS — J90 Pleural effusion, not elsewhere classified: Secondary | ICD-10-CM | POA: Diagnosis not present

## 2017-07-19 DIAGNOSIS — Z01812 Encounter for preprocedural laboratory examination: Secondary | ICD-10-CM | POA: Insufficient documentation

## 2017-07-19 DIAGNOSIS — Z0183 Encounter for blood typing: Secondary | ICD-10-CM | POA: Diagnosis not present

## 2017-07-19 DIAGNOSIS — M1711 Unilateral primary osteoarthritis, right knee: Secondary | ICD-10-CM | POA: Diagnosis not present

## 2017-07-19 DIAGNOSIS — Z01818 Encounter for other preprocedural examination: Secondary | ICD-10-CM | POA: Insufficient documentation

## 2017-07-19 LAB — COMPREHENSIVE METABOLIC PANEL
ALBUMIN: 3.6 g/dL (ref 3.5–5.0)
ALK PHOS: 87 U/L (ref 38–126)
ALT: 26 U/L (ref 17–63)
ANION GAP: 11 (ref 5–15)
AST: 40 U/L (ref 15–41)
BILIRUBIN TOTAL: 0.6 mg/dL (ref 0.3–1.2)
BUN: 9 mg/dL (ref 6–20)
CALCIUM: 8.9 mg/dL (ref 8.9–10.3)
CO2: 20 mmol/L — ABNORMAL LOW (ref 22–32)
Chloride: 104 mmol/L (ref 101–111)
Creatinine, Ser: 0.89 mg/dL (ref 0.61–1.24)
GFR calc Af Amer: >60 mL/min (ref >60)
GLUCOSE: 101 mg/dL — AB (ref 65–99)
Potassium: 4.3 mmol/L (ref 3.5–5.1)
Sodium: 135 mmol/L (ref 135–145)
Total Protein: 7.7 g/dL (ref 6.5–8.1)

## 2017-07-19 LAB — CBC WITH DIFFERENTIAL/PLATELET
BASOS PCT: 0 %
Basophils Absolute: 0 10*3/uL (ref 0.0–0.1)
Eosinophils Absolute: 0.1 10*3/uL (ref 0.0–0.7)
Eosinophils Relative: 2 %
HEMATOCRIT: 44.4 % (ref 39.0–52.0)
HEMOGLOBIN: 15.1 g/dL (ref 13.0–17.0)
LYMPHS PCT: 34 %
Lymphs Abs: 2 10*3/uL (ref 0.7–4.0)
MCH: 29.8 pg (ref 26.0–34.0)
MCHC: 34 g/dL (ref 30.0–36.0)
MCV: 87.6 fL (ref 78.0–100.0)
MONO ABS: 0.4 10*3/uL (ref 0.1–1.0)
MONOS PCT: 7 %
NEUTROS ABS: 3.3 10*3/uL (ref 1.7–7.7)
NEUTROS PCT: 57 %
Platelets: 222 10*3/uL (ref 150–400)
RBC: 5.07 MIL/uL (ref 4.22–5.81)
RDW: 14.7 % (ref 11.5–15.5)
WBC: 5.8 10*3/uL (ref 4.0–10.5)

## 2017-07-19 LAB — PROTIME-INR
INR: 1.02
PROTHROMBIN TIME: 13.4 s (ref 11.4–15.2)

## 2017-07-19 LAB — SURGICAL PCR SCREEN
MRSA, PCR: NEGATIVE
STAPHYLOCOCCUS AUREUS: NEGATIVE

## 2017-07-19 LAB — TYPE AND SCREEN
ABO/RH(D): B POS
Antibody Screen: NEGATIVE

## 2017-07-19 LAB — APTT: aPTT: 31 seconds (ref 24–36)

## 2017-07-19 NOTE — Telephone Encounter (Signed)
Patient called stating that Dr. Erlinda Hong wanted him to come off his blood thinners 3 days before his surgery on May 1st.  He stated that the last time he had surgery he was able to the bridge injections, he went to his cardiologist today and they want [place] to fax over a clearance form stating that it was okay to get those injections.  His cardiologist is Dr. Heron Nay.  He did not have a fax # for them.  Patient's CB#[phone].  Thank you.

## 2017-07-20 DIAGNOSIS — Z86711 Personal history of pulmonary embolism: Secondary | ICD-10-CM | POA: Insufficient documentation

## 2017-07-20 DIAGNOSIS — I2510 Atherosclerotic heart disease of native coronary artery without angina pectoris: Secondary | ICD-10-CM | POA: Insufficient documentation

## 2017-07-20 DIAGNOSIS — Z01818 Encounter for other preprocedural examination: Secondary | ICD-10-CM | POA: Insufficient documentation

## 2017-07-20 NOTE — Telephone Encounter (Signed)
Faxed to [facility].

## 2017-07-20 NOTE — Progress Notes (Addendum)
Anesthesia Chart Review:   Case:  [id number] Date/Time:  07/28/17 1229   Procedure:  RIGHT TOTAL KNEE ARTHROPLASTY (Right Knee)   Anesthesia type:  Spinal   Pre-op diagnosis:  right knee osteoarthritis   Location:  [facility] OR ROOM 10 / [place] [facility]   Surgeon:  Leandrew Koyanagi, MD      DISCUSSION: Pt is a 52 year old [name] with a history recurrent DVT, PE, on life-long eliquis and requires lovenox bridge. Current smoker. Saw Jeffrey Barrios, PA with cardiology 07/21/17 and was cleared for surgery.    VS: BP (!) 152/106 Comment: taken manually notified Kathie RN  Pulse 92   Temp 36.7 C   Resp 20   Ht 6\' 4"  (1.93 m)   Wt 261 lb 3.9 oz (118.5 kg)   SpO2 99%   BMI 31.80 kg/m   PROVIDERS: PCP is Jeffrey Bihari, MD  Cardiologist is Jeffrey Him, MD. Cleared for surgery at last office visit 07/21/17 Jeffrey Barrios, PA  Pulmonologist was Jeffrey Partridge, MD. Pt had appointment 07/27/17 with Jeffrey Edison, NP for routine f/u but did not show  Hematologist is Jeffrey Isaac, MD. At last office visit 01/12/17, Dr. Lebron [name] documents pt will require: "--Indefinite anticoagulation --Any procedure will need to be bridged.              --For a colonoscopy, hold Eliquis 48hrs prior to procedure and place the patient on enoxaparin 1mg /kg BID. Hold enoxaparin 24hrs prior to the procedure and resume right after.             --For any surgical intervention, hold Apixaban (Eliquis) with enoxaparin bridge as above, but resumption of anticoagulation will be left to the discretion of the operating surgeon based on the bleeding risk of any particular surgery. If patient is hospitalized, heparin drip may be the initial anticoagulant choice with transition to Apixaban (Eliquis) once bleeding risk subsided sufficiently in the opinion of the surgeon"     LABS: Labs reviewed: Acceptable for surgery. (all labs ordered are listed, but only abnormal results are displayed)  Labs Reviewed  COMPREHENSIVE METABOLIC PANEL -  Abnormal; Notable for the following components:      Result Value   CO2 20 (*)    Glucose, Bld 101 (*)    All other components within normal limits  SURGICAL PCR SCREEN  APTT  CBC WITH DIFFERENTIAL/PLATELET  PROTIME-INR  TYPE AND SCREEN     IMAGES:  CXR 07/19/17: Minimal right pleural effusion  CT chest 12/01/16:  1. Minimal motion degradation. 2. Resolution of left upper lobe airspace disease since 06/03/2016. This was likely pulmonary infarct, given widespread pulmonary emboli on that exam. 3. Age advanced coronary artery atherosclerosis. Recommend assessment of coronary risk factors and consideration of medical therapy. 4.  Aortic Atherosclerosis. 5. Hepatic steatosis. 6.  Emphysema   EKG 02/08/17: NSR with sinus arrhythmia   CV:  CT coronary morphology 10/13/16:  1. High coronary calcium score of 392. This was 8 percentile for age and sex matched control. 2. Normal coronary origin with right dominance. 3. Mild non-obstructive CAD. Aggressive risk factor modification is recommended. 4. Dilated pulmonary artery measuring 35 mm consistent with pulmonary hypertension.  Echo 09/22/16:  - Left ventricle: The cavity size was normal. Wall thickness was increased in a pattern of moderate LVH. Systolic function was normal. The estimated ejection fraction was in the range of 60%to 65%. Wall motion was normal; there were no regional wallmotion abnormalities. Left ventricular diastolic functionparameters were normal. -  Left atrium: The atrium was mildly dilated. - Atrial septum: No defect or patent foramen ovale was identified.   OTHER TESTING:  PFT 12/22/16: FVC 3.90 L (87%) FEV1 3.03 L (83%) FEV1/FVC 0.76 FEF 25-75 2.35 L (Jeffrey%) negative bronchodilator response TLC 6.36 L (84%) RV 99% ERV 166% DLCO corrected 71%  6MWT 01/06/17: Walked 369 meters / Baseline Sat 100% on RA / Nadir Sat 97% on RA    Past Medical History:  Diagnosis Date  . Acute pulmonary embolus (HCC)     bilateral submassive PE in setting of missing several doses of Xarelto for his DVT  . Arthritis   . DVT (deep venous thrombosis) ([place]) 11/28/2013   RT LEG  . High cholesterol   . Hypertension   . Marijuana use   - Prior hx cocaine use.    Past Surgical History:  Procedure Laterality Date  . HERNIA REPAIR     5597 and 4163; umbilical hernia repair  . KNEE SURGERY Bilateral    Left knee 1993, right knee 2004  . TONSILLECTOMY    . TOTAL KNEE ARTHROPLASTY Left 02/10/2017   Procedure: LEFT TOTAL KNEE ARTHROPLASTY;  Surgeon: Leandrew Koyanagi, MD;  Location: [place];  Service: Orthopedics;  Laterality: Left;    MEDICATIONS: . albuterol (PROVENTIL HFA;VENTOLIN HFA) 108 (90 Base) MCG/ACT inhaler  . amLODipine (NORVASC) 10 MG tablet  . apixaban (ELIQUIS) 5 MG TABS tablet  . atorvastatin (LIPITOR) 40 MG tablet  . enoxaparin (LOVENOX) 120 MG/0.8ML injection  . lisinopril (PRINIVIL,ZESTRIL) 10 MG tablet  . mupirocin ointment (BACTROBAN) 2 %  . ondansetron (ZOFRAN) 4 MG tablet  . oxyCODONE (OXYCONTIN) 10 mg 12 hr tablet  . oxyCODONE-acetaminophen (PERCOCET) 5-325 MG tablet  . promethazine (PHENERGAN) 25 MG tablet  . senna-docusate (SENOKOT S) 8.6-50 MG tablet  . tiZANidine (ZANAFLEX) 4 MG tablet  . traMADol (ULTRAM) 50 MG tablet   No current facility-administered medications for this encounter.    Last dose eliquis 07/24/17. Lovenox starts 4/28, last dose morning of 07/27/17.    If no changes, I anticipate pt can proceed with surgery as scheduled.   Jeffrey Cass, FNP-BC [facility]/Anesthesiology Phone: [phone] 07/27/2017 4:38 PM

## 2017-07-20 NOTE — Telephone Encounter (Signed)
See message.

## 2017-07-20 NOTE — Telephone Encounter (Signed)
Ok, thanks.

## 2017-07-20 NOTE — Telephone Encounter (Signed)
Sherrie will

## 2017-07-20 NOTE — Progress Notes (Signed)
Cardiology Office Note    Date:  07/21/2017   ID:  Jeffrey [name], DOB [birth year], MRN [id number]  PCP:  Tonette Bihari, MD  Cardiologist: Fransico Him, MD  No chief complaint on file.   History of Present Illness:  Jeffrey [name] is a 52 y.o. male here today for preoperative clearance for right total knee replacement by Dr.Xu.  Patient has history of recurrent DVT on Xarelto and then developed massive bilateral PEs with right heart strain 05/2016 after missing several doses of Xarelto.(now on eliquis) Initial EKG worrisome for STEMI but repeat EKG strain felt secondary to PE. 2Decho showed RV strain and mild RV dysfunction. Saw Dr. Radford Pax 08/05/16. Repeat echo showed normal LVEF with moderate LVH and mild LAE.Coronary CTA showed high calcium score with mild nonobstructive CAD-aggressive risk factor modification recommended. Also had severe hepatic steatosis and was referred to GI.  Patient had lovenox bridge with he had knee replacement in 01/2017.  Patient comes in today for cardiac clearance for total knee replacement.  He denies any chest pain, palpitations, dyspnea, dyspnea on exertion, dizziness or presyncope.  He is active chasing his 65-year-old around and does a lot of yard work.  Unfortunately he is smoking 1 pack of cigarettes daily.  He drinks about a sixpack a week.    Past Medical History:  Diagnosis Date  . Acute pulmonary embolus (HCC)    bilateral submassive PE in setting of missing several doses of Xarelto for his DVT  . Arthritis   . DVT (deep venous thrombosis) ([place]) 11/28/2013   RT LEG  . High cholesterol   . Hypertension   . Marijuana use     Past Surgical History:  Procedure Laterality Date  . HERNIA REPAIR     2979 and 8921; umbilical hernia repair  . KNEE SURGERY Bilateral    Left knee 1993, right knee 2004  . TONSILLECTOMY    . TOTAL KNEE ARTHROPLASTY Left 02/10/2017   Procedure: LEFT TOTAL KNEE ARTHROPLASTY;  Surgeon: Leandrew Koyanagi, MD;   Location: [place];  Service: Orthopedics;  Laterality: Left;    Current Medications: Current Meds  Medication Sig  . albuterol (PROVENTIL HFA;VENTOLIN HFA) 108 (90 Base) MCG/ACT inhaler Inhale 2 puffs into the lungs every 6 (six) hours as needed for wheezing or shortness of breath.  Marland Kitchen amLODipine (NORVASC) 10 MG tablet Take 1 tablet (10 mg total) by mouth daily.  Marland Kitchen apixaban (ELIQUIS) 5 MG TABS tablet Take 1 tablet (5 mg total) by mouth 2 (two) times daily.  Marland Kitchen atorvastatin (LIPITOR) 40 MG tablet TAKE 1 TABLET BY MOUTH DAILY.  . mupirocin ointment (BACTROBAN) 2 % Apply 1 application topically 2 (two) times daily.  . ondansetron (ZOFRAN) 4 MG tablet Take 1-2 tablets (4-8 mg total) every 8 (eight) hours as needed by mouth for nausea or vomiting.  Marland Kitchen oxyCODONE (OXYCONTIN) 10 mg 12 hr tablet Take 1 tablet (10 mg total) every 12 (twelve) hours by mouth.  . oxyCODONE-acetaminophen (PERCOCET) 5-325 MG tablet Take 1-2 tablets every 8 (eight) hours as needed by mouth for severe pain.  . promethazine (PHENERGAN) 25 MG tablet Take 1 tablet (25 mg total) every 6 (six) hours as needed by mouth for nausea.  Marland Kitchen senna-docusate (SENOKOT S) 8.6-50 MG tablet Take 1 tablet at bedtime as needed by mouth.  Marland Kitchen tiZANidine (ZANAFLEX) 4 MG tablet Take 1 tablet (4 mg total) by mouth every 6 (six) hours as needed for muscle spasms.  . traMADol (ULTRAM) 50 MG  tablet TAKE 1 TABLET BY MOUTH EVERY 8 HOURS (Patient taking differently: TAKE 1 TABLET BY MOUTH EVERY 8 HOURS AS NEEDED FOR PAIN.)     Allergies:   Patient has no known allergies.   Social History   Socioeconomic History  . Marital status: Married    Spouse name: Not on file  . Number of children: Not on file  . Years of education: Not on file  . Highest education level: Not on file  Occupational History  . Not on file  Social Needs  . Financial resource strain: Not on file  . Food insecurity:    Worry: Not on file    Inability: Not on file  . Transportation  needs:    Medical: Not on file    Non-medical: Not on file  Tobacco Use  . Smoking status: Current Every Day Smoker    Packs/day: 0.25    Years: 35.00    Pack years: 8.75    Types: Cigarettes    Start date: 10/07/1976  . Smokeless tobacco: Never Used  . Tobacco comment: Stopped for up to 5 years total - Peak rate 2ppd  Substance and Sexual Activity  . Alcohol use: Yes    Alcohol/week: 7.2 oz    Types: 12 Cans of beer per week    Comment: seldom wine/liquor  . Drug use: Yes    Types: Marijuana, Cocaine    Comment: Cocaine last used in 2004, current marijuana use  . Sexual activity: Yes    Birth control/protection: None    Comment: With wife  Lifestyle  . Physical activity:    Days per week: Not on file    Minutes per session: Not on file  . Stress: Not on file  Relationships  . Social connections:    Talks on phone: Not on file    Gets together: Not on file    Attends religious service: Not on file    Active member of club or organization: Not on file    Attends meetings of clubs or organizations: Not on file    Relationship status: Not on file  Other Topics Concern  . Not on file  Social History Narrative   Lives in [place].   Chickens as pet.    Hobbies: Radiographer, therapeutic Pulmonary (11/24/16):   Originally from [place]. Has worked in Architect, Ambulance person, Press photographer, & in a warehouse. No known asbestos exposure. Previously had chickens as pets. No mold exposure.      Family History:  The patient's family history includes Alcohol abuse in his brother and mother; Arthritis in his father, maternal grandfather, maternal grandmother, mother, and other; Asthma in his cousin; Clotting disorder in his mother; Depression in his mother; Drug abuse in his brother; Heart disease in his maternal grandfather, maternal grandmother, mother, and other; Hypertension in his mother; Kidney disease in his mother; Sickle cell anemia in his brother.   ROS:   Please see the history of  present illness.    Review of Systems  Constitution: Negative.  HENT: Negative.   Cardiovascular: Negative.   Respiratory: Negative.   Endocrine: Negative.   Hematologic/Lymphatic: Negative.   Musculoskeletal: Negative.   Gastrointestinal: Negative.   Genitourinary: Negative.   Neurological: Negative.    All other systems reviewed and are negative.   PHYSICAL EXAM:   VS:  BP 140/90   Pulse 98   Ht '6\' 4"'  (1.93 m)   Wt 264 lb (119.7 kg)   BMI  32.14 kg/m   Physical Exam  GEN: Well nourished, well developed, in no acute distress  Neck: no JVD, carotid bruits, or masses Cardiac:RRR; positive S4, no murmurs, rubs, or gallops  Respiratory:  clear to auscultation bilaterally, normal work of breathing GI: soft, nontender, nondistended, + BS Ext: without cyanosis, clubbing, or edema, Good distal pulses bilaterally Neuro:  Alert and Oriented x 3, Psych: euthymic mood, full affect  Wt Readings from Last 3 Encounters:  07/21/17 264 lb (119.7 kg)  07/19/17 261 lb 3.9 oz (118.5 kg)  02/10/17 256 lb (116.1 kg)      Studies/Labs Reviewed:   EKG:  EKG is  ordered today.  The ekg ordered today demonstrates normal sinus rhythm at 98 bpm, no acute change  Recent Labs: 07/19/2017: ALT 26; BUN 9; Creatinine, Ser 0.89; Hemoglobin 15.1; Platelets 222; Potassium 4.3; Sodium 135   Lipid Panel    Component Value Date/Time   CHOL 103 10/15/2016 0913   TRIG 67 10/15/2016 0913   HDL 50 10/15/2016 0913   CHOLHDL 2.1 10/15/2016 0913   CHOLHDL 3.5 05/04/2013 1434   VLDL 28 05/04/2013 1434   LDLCALC 40 10/15/2016 0913    Additional studies/ records that were reviewed today include:  Coronary CT 7/2018IMPRESSION: 1. High coronary calcium score of 392. This was 75 percentile for age and sex matched control.   2. Normal coronary origin with right dominance.   3. Mild non-obstructive CAD. Aggressive risk factor modification is recommended.   4. Dilated pulmonary artery measuring 35 mm  consistent with pulmonary hypertension.   Ena Dawley   IMPRESSION: 1. Decreasing burden of bilateral pulmonary emboli when compared to prior study 06/03/2016. No finding acute pulmonary infarction at this time. 2. Severe hepatic steatosis.   Electronically Signed: By: Vinnie Langton M.D. On: 10/13/2016 16:09   2D echo 09/22/16 Study Conclusions   - Left ventricle: The cavity size was normal. Wall thickness was   increased in a pattern of moderate LVH. Systolic function was   normal. The estimated ejection fraction was in the range of 60%   to 65%. Wall motion was normal; there were no regional wall   motion abnormalities. Left ventricular diastolic function   parameters were normal. - Left atrium: The atrium was mildly dilated. - Atrial septum: No defect or patent foramen ovale was identified.     ASSESSMENT:    1. Preoperative clearance   2. Deep vein thrombosis (DVT) of lower extremity, unspecified chronicity, unspecified laterality, unspecified vein (HCC)   3. History of pulmonary embolus (PE)   4. Chronic anticoagulation   5. Essential hypertension   6. Mild CAD   7. Tobacco abuse      PLAN:  In order of problems listed above:  Preoperative clearance for TKR by Dr. Erlinda Hong. Patient with history of DVT followed by massive bilateral PE when missed several doses of Xarelto. (now on eliquis) Had RV strain. Coronary CT 09/2016 high calcium score with mild nonobstructive CAD. Echo with normal LVEF. Patient did well with lovenox bridge for TKR 01/2017.  Based on revised cardiac risk index he does not need any further cardiac testing before undergoing surgery.  He has a high functional capacity with of 6.27 METs.  He is cleared for surgery and will be referred to the anticoagulation clinic for a Lovenox bridge through surgery.  According to the Revised Cardiac Risk Index (RCRI), his Perioperative Risk of Major Cardiac Event is (%): 0.4  His Functional Capacity in METs is:  6.27  according to the Duke Activity Status Index (DASI).    History of DVT on Eliquis  History of massive bilateral PE 05/2016 after missing several doses of Xarelto, now on Eliquis  Chronic anticoagulation refer to anticoagulation clinic for Lovenox bridge.  Essential HTN blood pressure elevated and runs high at times at home.  Will add lisinopril 10 mg once daily.  Repeat be met in 2-3 weeks.  Mild nonobstructive CAD on Coronary CT 09/2016 no chest pain  Tobacco abuse now smoking 1 pack/day.  Smoking cessation recommended.   Medication Adjustments/Labs and Tests Ordered: Current medicines are reviewed at length with the patient today.  Concerns regarding medicines are outlined above.  Medication changes, Labs and Tests ordered today are listed in the Patient Instructions below. There are no Patient Instructions on file for this visit.   Sumner Boast, PA-C  07/21/2017 10:55 AM    [facility] [address], [place], [place]  [address] Phone: [phone]; Fax: [phone]

## 2017-07-20 NOTE — Telephone Encounter (Signed)
Can you do this

## 2017-07-21 ENCOUNTER — Encounter: Payer: Self-pay | Admitting: Physician Assistant

## 2017-07-21 ENCOUNTER — Telehealth: Payer: Self-pay

## 2017-07-21 ENCOUNTER — Ambulatory Visit (INDEPENDENT_AMBULATORY_CARE_PROVIDER_SITE_OTHER): Payer: Self-pay | Admitting: Pharmacist

## 2017-07-21 ENCOUNTER — Ambulatory Visit (INDEPENDENT_AMBULATORY_CARE_PROVIDER_SITE_OTHER): Payer: 59 | Admitting: Physician Assistant

## 2017-07-21 VITALS — BP 140/90 | HR 98 | Ht 76.0 in | Wt 264.0 lb

## 2017-07-21 DIAGNOSIS — Z7901 Long term (current) use of anticoagulants: Secondary | ICD-10-CM

## 2017-07-21 DIAGNOSIS — Z86711 Personal history of pulmonary embolism: Secondary | ICD-10-CM | POA: Diagnosis not present

## 2017-07-21 DIAGNOSIS — Z01818 Encounter for other preprocedural examination: Secondary | ICD-10-CM

## 2017-07-21 DIAGNOSIS — I10 Essential (primary) hypertension: Secondary | ICD-10-CM | POA: Diagnosis not present

## 2017-07-21 DIAGNOSIS — I2510 Atherosclerotic heart disease of native coronary artery without angina pectoris: Secondary | ICD-10-CM | POA: Diagnosis not present

## 2017-07-21 DIAGNOSIS — I82409 Acute embolism and thrombosis of unspecified deep veins of unspecified lower extremity: Secondary | ICD-10-CM

## 2017-07-21 DIAGNOSIS — I82401 Acute embolism and thrombosis of unspecified deep veins of right lower extremity: Secondary | ICD-10-CM

## 2017-07-21 DIAGNOSIS — Z720 Tobacco use: Secondary | ICD-10-CM | POA: Diagnosis not present

## 2017-07-21 MED ORDER — ENOXAPARIN SODIUM 120 MG/0.8ML ~~LOC~~ SOLN: 120.0000 mg | Freq: Two times a day (BID) | SUBCUTANEOUS | 0 refills | Status: DC

## 2017-07-21 MED ORDER — LISINOPRIL 10 MG PO TABS: 10.0000 mg | ORAL_TABLET | Freq: Every day | ORAL | 3 refills | Status: DC

## 2017-07-21 MED FILL — LISINOPRIL 10 MG TABS: 10 | 90 days supply | Qty: 90 | Fill #0

## 2017-07-21 MED FILL — ENOXAPARIN 120 MG/0.8 ML SY: 120 | 2 days supply | Qty: 4 | Fill #0

## 2017-07-21 NOTE — Telephone Encounter (Signed)
Clearance faxed via Epic to Dr. Phoebe Sharps office at [phone].

## 2017-07-21 NOTE — Telephone Encounter (Signed)
Patient with diagnosis of DVT/PE on Eliquis for anticoagulation.  Patient has history of recurrent DVT on Xarelto and then developed massive bilateral PEs with right heart strain 05/2016 after missing several doses of Xarelto. Patient had lovenox bridge with he had knee replacement in 01/2017.  Procedure: Knew arthroplasty Date of procedure: 07/28/17  CrCl 164ml/min  Will plan to bridge with lovenox as previously done in Nov 2018 while Eliquis on hold for 3 days prior to procedure. This will be coordinated while pt in office today.

## 2017-07-21 NOTE — Progress Notes (Signed)
Pt presents to clinic today for Lovenox bridging due to hx of recurrent VTE even on oral anticoagulation. For upcoming TKA, will need to hold Eliquis for 3 days prior. Renal function is normal, wt 120kg. Will dose 1mg /kg BID = Lovenox 120mg  BID. Procedure date is 5/1. Reviewed Lovenox injection technique with pt, he has used Lovenox once before.  4/27: Last day of Eliquis in the evening before procedure. 4/28: Start Lovenox 120mg  injections subcutaneously at 8am and 8pm 4/29: Inject Lovenox 120mg  subcutaneously at 8am and 8pm 4/30: Inject Lovenox 120mg  subcutaneously at 8am, do NOT inject evening dose. 5/1: Procedure date: No Eliquis, no Lovenox. 5/2: Resume Eliquis as directed by MD

## 2017-07-21 NOTE — Telephone Encounter (Signed)
Pt provided with Eliquis to Lovenox bridging instructions while in clinic today.

## 2017-07-21 NOTE — Patient Instructions (Addendum)
4/27: Last day of Eliquis in the evening before procedure.  4/28: Start Lovenox 120mg  injections subcutaneously at 8am and 8pm  4/29: Inject Lovenox 120mg  subcutaneously at 8am and 8pm  4/30: Inject Lovenox 120mg  subcutaneously at 8am, do NOT inject  evening dose.  5/1: Procedure date: No Eliquis, no Lovenox.  5/2: Resume Eliquis as directed by MD

## 2017-07-21 NOTE — Patient Instructions (Signed)
Medication Instructions:  Your physician has recommended you make the following change in your medication:   START: lisinopril 10 mg tablet: Take 1 tablet once daily  Labwork: Your physician recommends that you return for lab work in 2-3 weeks for BMET   Testing/Procedures: None ordered  Follow-Up: Your physician recommends that you schedule a follow-up appointment in: 2-3 weeks for Nurse Visit for Blood Pressure Check (on same day as lab appointment)  Your physician wants you to follow-up in: 1 year with Dr. Radford Pax. You will receive a reminder letter in the mail two months in advance. If you don't receive a letter, please call our office to schedule the follow-up appointment.   Any Other Special Instructions Will Be Listed Below (If Applicable). YOU ARE CLEARED FOR SURGERY  1-800-QUIT-NOW   Steps to Quit Smoking Smoking tobacco can be bad for your health. It can also affect almost every organ in your body. Smoking puts you and people around you at risk for many serious long-lasting (chronic) diseases. Quitting smoking is hard, but it is one of the best things that you can do for your health. It is never too late to quit. What are the benefits of quitting smoking? When you quit smoking, you lower your risk for getting serious diseases and conditions. They can include:  Lung cancer or lung disease.  Heart disease.  Stroke.  Heart attack.  Not being able to have children (infertility).  Weak bones (osteoporosis) and broken bones (fractures).  If you have coughing, wheezing, and shortness of breath, those symptoms may get better when you quit. You may also get sick less often. If you are pregnant, quitting smoking can help to lower your chances of having a baby of low birth weight. What can I do to help me quit smoking? Talk with your doctor about what can help you quit smoking. Some things you can do (strategies) include:  Quitting smoking totally, instead of slowly cutting  back how much you smoke over a period of time.  Going to in-person counseling. You are more likely to quit if you go to many counseling sessions.  Using resources and support systems, such as: ? Database administrator with a Social worker. ? Phone quitlines. ? Careers information officer. ? Support groups or group counseling. ? Text messaging programs. ? Mobile phone apps or applications.  Taking medicines. Some of these medicines may have nicotine in them. If you are pregnant or breastfeeding, do not take any medicines to quit smoking unless your doctor says it is okay. Talk with your doctor about counseling or other things that can help you.  Talk with your doctor about using more than one strategy at the same time, such as taking medicines while you are also going to in-person counseling. This can help make quitting easier. What things can I do to make it easier to quit? Quitting smoking might feel very hard at first, but there is a lot that you can do to make it easier. Take these steps:  Talk to your family and friends. Ask them to support and encourage you.  Call phone quitlines, reach out to support groups, or work with a Social worker.  Ask people who smoke to not smoke around you.  Avoid places that make you want (trigger) to smoke, such as: ? Bars. ? Parties. ? Smoke-break areas at work.  Spend time with people who do not smoke.  Lower the stress in your life. Stress can make you want to smoke. Try these things to  help your stress: ? Getting regular exercise. ? Deep-breathing exercises. ? Yoga. ? Meditating. ? Doing a body scan. To do this, close your eyes, focus on one area of your body at a time from head to toe, and notice which parts of your body are tense. Try to relax the muscles in those areas.  Download or buy apps on your mobile phone or tablet that can help you stick to your quit plan. There are many free apps, such as QuitGuide from the [employer] Office manager for Disease Control and  Prevention). You can find more support from smokefree.gov and other websites.  This information is not intended to replace advice given to you by your health care provider. Make sure you discuss any questions you have with your health care provider. Document Released: 01/10/2009 Document Revised: 11/12/2015 Document Reviewed: 07/31/2014 Elsevier Interactive Patient Education  [employer].     If you need a refill on your cardiac medications before your next appointment, please call your pharmacy.

## 2017-07-21 NOTE — Telephone Encounter (Signed)
   [facility] Pre-operative Risk Assessment    Request for surgical clearance:  1. What type of surgery is being performed? RIGHT Knee Arthroplasty   2. When is this surgery scheduled? 07/28/17   3. What type of clearance is required (medical clearance vs. Pharmacy clearance to hold med vs. Both)? BOTH  4. Are there any medications that need to be held prior to surgery and how long?  Eliquis-PATIENT REQUIRES BRIDGING  5. Practice name and name of physician performing surgery? [facility]: Dr. Eduard Roux  6. What is your office phone number? [phone]    7.   What is your office fax number? [phone] Attn: Sherri  8.   Anesthesia type (None, local, MAC, general) ? Spinal   Patient is being seen today by Ermalinda Barrios, PA for cardiac clearance.

## 2017-07-21 NOTE — Telephone Encounter (Signed)
Per Office Visit Note from today with Ermalinda Barrios, PA:  Preoperative clearance for TKR by Dr. Erlinda Hong. Patient with history of DVT followed by massive bilateral PE when missed several doses of Xarelto. (now on eliquis) Had RV strain. Coronary CT 09/2016 high calcium score with mild nonobstructive CAD. Echo with normal LVEF. Patient did well with lovenox bridge for TKR 01/2017.  Based on revised cardiac risk index he does not need any further cardiac testing before undergoing surgery.  He has a high functional capacity with of 6.27 METs.  He is cleared for surgery and will be referred to the anticoagulation clinic for a Lovenox bridge through surgery.

## 2017-07-27 ENCOUNTER — Ambulatory Visit: Payer: 59 | Admitting: Adult Health

## 2017-07-27 MED ORDER — DEXTROSE 5 % IV SOLN
3.0000 g | INTRAVENOUS | Status: AC
  Administered 2017-07-28: 3 g via INTRAVENOUS
  Filled 2017-07-27: qty 3

## 2017-07-27 MED ORDER — TRANEXAMIC ACID 1000 MG/10ML IV SOLN
2000.0000 mg | INTRAVENOUS | Status: AC
  Administered 2017-07-28: 2000 mg via TOPICAL
  Filled 2017-07-27 (×2): qty 20

## 2017-07-28 ENCOUNTER — Encounter (HOSPITAL_COMMUNITY)
Admission: RE | Disposition: A | Discharge status: Home Health | Payer: Self-pay | Source: Ambulatory Visit | Attending: Orthopaedic Surgery

## 2017-07-28 ENCOUNTER — Encounter (HOSPITAL_COMMUNITY): Payer: Self-pay

## 2017-07-28 ENCOUNTER — Inpatient Hospital Stay (HOSPITAL_COMMUNITY): Payer: 59 | Admitting: Anesthesiology

## 2017-07-28 ENCOUNTER — Inpatient Hospital Stay (HOSPITAL_COMMUNITY): Payer: 59

## 2017-07-28 ENCOUNTER — Inpatient Hospital Stay (HOSPITAL_COMMUNITY)
Admission: RE | Admit: 2017-07-28 | Discharge: 2017-07-30 | DRG: 470 | Disposition: A | Discharge status: Home Health | Payer: 59 | Source: Ambulatory Visit | Attending: Orthopaedic Surgery | Admitting: Orthopaedic Surgery

## 2017-07-28 ENCOUNTER — Inpatient Hospital Stay (HOSPITAL_COMMUNITY): Payer: 59 | Admitting: Emergency Medicine

## 2017-07-28 DIAGNOSIS — Z86711 Personal history of pulmonary embolism: Secondary | ICD-10-CM

## 2017-07-28 DIAGNOSIS — I2510 Atherosclerotic heart disease of native coronary artery without angina pectoris: Secondary | ICD-10-CM | POA: Diagnosis not present

## 2017-07-28 DIAGNOSIS — Z96659 Presence of unspecified artificial knee joint: Secondary | ICD-10-CM

## 2017-07-28 DIAGNOSIS — Z8261 Family history of arthritis: Secondary | ICD-10-CM | POA: Diagnosis not present

## 2017-07-28 DIAGNOSIS — F17210 Nicotine dependence, cigarettes, uncomplicated: Secondary | ICD-10-CM | POA: Diagnosis present

## 2017-07-28 DIAGNOSIS — M1711 Unilateral primary osteoarthritis, right knee: Principal | ICD-10-CM | POA: Diagnosis present

## 2017-07-28 DIAGNOSIS — Z96652 Presence of left artificial knee joint: Secondary | ICD-10-CM | POA: Diagnosis present

## 2017-07-28 DIAGNOSIS — Z96651 Presence of right artificial knee joint: Secondary | ICD-10-CM

## 2017-07-28 DIAGNOSIS — Z79899 Other long term (current) drug therapy: Secondary | ICD-10-CM | POA: Diagnosis not present

## 2017-07-28 DIAGNOSIS — I10 Essential (primary) hypertension: Secondary | ICD-10-CM | POA: Diagnosis not present

## 2017-07-28 DIAGNOSIS — Z6832 Body mass index (BMI) 32.0-32.9, adult: Secondary | ICD-10-CM | POA: Diagnosis not present

## 2017-07-28 DIAGNOSIS — Z7901 Long term (current) use of anticoagulants: Secondary | ICD-10-CM

## 2017-07-28 DIAGNOSIS — Z86718 Personal history of other venous thrombosis and embolism: Secondary | ICD-10-CM

## 2017-07-28 DIAGNOSIS — Z471 Aftercare following joint replacement surgery: Secondary | ICD-10-CM | POA: Diagnosis not present

## 2017-07-28 DIAGNOSIS — G8918 Other acute postprocedural pain: Secondary | ICD-10-CM | POA: Diagnosis not present

## 2017-07-28 DIAGNOSIS — E7800 Pure hypercholesterolemia, unspecified: Secondary | ICD-10-CM | POA: Diagnosis present

## 2017-07-28 DIAGNOSIS — E669 Obesity, unspecified: Secondary | ICD-10-CM | POA: Diagnosis present

## 2017-07-28 HISTORY — PX: TOTAL KNEE ARTHROPLASTY: SHX125

## 2017-07-28 LAB — PROTIME-INR
INR: 0.99
Prothrombin Time: 13 seconds (ref 11.4–15.2)

## 2017-07-28 SURGERY — ARTHROPLASTY, KNEE, TOTAL
Anesthesia: Spinal | Site: Knee | Laterality: Right

## 2017-07-28 MED ORDER — METOCLOPRAMIDE HCL 5 MG PO TABS: 5.0000 mg | ORAL_TABLET | Freq: Three times a day (TID) | ORAL | Status: DC | PRN

## 2017-07-28 MED ORDER — METHOCARBAMOL 500 MG PO TABS
ORAL_TABLET | ORAL | Status: AC
  Filled 2017-07-28: qty 1

## 2017-07-28 MED ORDER — BUPIVACAINE LIPOSOME 1.3 % IJ SUSP
20.0000 mL | Freq: Once | INTRAMUSCULAR | Status: AC
  Administered 2017-07-28: 20 mL
  Filled 2017-07-28: qty 20

## 2017-07-28 MED ORDER — MAGNESIUM CITRATE PO SOLN: 1.0000 | Freq: Once | ORAL | Status: DC | PRN

## 2017-07-28 MED ORDER — LACTATED RINGERS IV SOLN
INTRAVENOUS | Status: DC | PRN
  Administered 2017-07-28 (×2): via INTRAVENOUS

## 2017-07-28 MED ORDER — SORBITOL 70 % SOLN: 30.0000 mL | Freq: Every day | Status: DC | PRN

## 2017-07-28 MED ORDER — OXYCODONE HCL ER 10 MG PO T12A: 10.0000 mg | EXTENDED_RELEASE_TABLET | Freq: Two times a day (BID) | ORAL | 0 refills | Status: AC

## 2017-07-28 MED ORDER — ONDANSETRON HCL 4 MG PO TABS: 4.0000 mg | ORAL_TABLET | Freq: Three times a day (TID) | ORAL | Status: DC | PRN

## 2017-07-28 MED ORDER — GABAPENTIN 300 MG PO CAPS
300.0000 mg | ORAL_CAPSULE | Freq: Three times a day (TID) | ORAL | Status: DC
  Administered 2017-07-28 – 2017-07-30 (×5): 300 mg via ORAL
  Filled 2017-07-28 (×5): qty 1

## 2017-07-28 MED ORDER — METHOCARBAMOL 1000 MG/10ML IJ SOLN
500.0000 mg | Freq: Four times a day (QID) | INTRAVENOUS | Status: DC | PRN
  Filled 2017-07-28: qty 5

## 2017-07-28 MED ORDER — CHLORHEXIDINE GLUCONATE 4 % EX LIQD: 60.0000 mL | Freq: Once | CUTANEOUS | Status: DC

## 2017-07-28 MED ORDER — SODIUM CHLORIDE 0.9 % IV SOLN: INTRAVENOUS | Status: DC

## 2017-07-28 MED ORDER — MIDAZOLAM HCL 5 MG/5ML IJ SOLN
INTRAMUSCULAR | Status: DC | PRN
  Administered 2017-07-28: 2 mg via INTRAVENOUS

## 2017-07-28 MED ORDER — FENTANYL CITRATE (PF) 100 MCG/2ML IJ SOLN
INTRAMUSCULAR | Status: AC
  Administered 2017-07-28: 50 ug via INTRAVENOUS
  Filled 2017-07-28: qty 2

## 2017-07-28 MED ORDER — ACETAMINOPHEN 325 MG PO TABS: 325.0000 mg | ORAL_TABLET | Freq: Four times a day (QID) | ORAL | Status: DC | PRN

## 2017-07-28 MED ORDER — KETOROLAC TROMETHAMINE 15 MG/ML IJ SOLN
30.0000 mg | Freq: Four times a day (QID) | INTRAMUSCULAR | Status: AC
  Administered 2017-07-28 – 2017-07-29 (×4): 30 mg via INTRAVENOUS
  Filled 2017-07-28 (×4): qty 2

## 2017-07-28 MED ORDER — DOCUSATE SODIUM 100 MG PO CAPS
100.0000 mg | ORAL_CAPSULE | Freq: Two times a day (BID) | ORAL | Status: DC
  Administered 2017-07-28 – 2017-07-30 (×4): 100 mg via ORAL
  Filled 2017-07-28 (×4): qty 1

## 2017-07-28 MED ORDER — HYDROMORPHONE HCL 2 MG PO TABS: 2.0000 mg | ORAL_TABLET | ORAL | 0 refills | Status: DC | PRN

## 2017-07-28 MED ORDER — PROPOFOL 500 MG/50ML IV EMUL
INTRAVENOUS | Status: DC | PRN
  Administered 2017-07-28: 75 ug/kg/min via INTRAVENOUS

## 2017-07-28 MED ORDER — APIXABAN 5 MG PO TABS
5.0000 mg | ORAL_TABLET | Freq: Two times a day (BID) | ORAL | Status: DC
  Administered 2017-07-28 – 2017-07-30 (×4): 5 mg via ORAL
  Filled 2017-07-28 (×4): qty 1

## 2017-07-28 MED ORDER — SENNOSIDES-DOCUSATE SODIUM 8.6-50 MG PO TABS: 1.0000 | ORAL_TABLET | Freq: Every evening | ORAL | 1 refills | Status: DC | PRN

## 2017-07-28 MED ORDER — ALUM & MAG HYDROXIDE-SIMETH 200-200-20 MG/5ML PO SUSP
30.0000 mL | ORAL | Status: DC | PRN
Start: 2017-07-28 — End: 2017-07-30

## 2017-07-28 MED ORDER — METOCLOPRAMIDE HCL 5 MG/ML IJ SOLN: 5.0000 mg | Freq: Three times a day (TID) | INTRAMUSCULAR | Status: DC | PRN

## 2017-07-28 MED ORDER — SODIUM CHLORIDE 0.9 % IR SOLN
Status: DC | PRN
  Administered 2017-07-28: 3000 mL

## 2017-07-28 MED ORDER — POLYETHYLENE GLYCOL 3350 17 G PO PACK: 17.0000 g | PACK | Freq: Every day | ORAL | Status: DC | PRN

## 2017-07-28 MED ORDER — MIDAZOLAM HCL 2 MG/2ML IJ SOLN
INTRAMUSCULAR | Status: AC
  Administered 2017-07-28: 2 mg via INTRAVENOUS
  Filled 2017-07-28: qty 2

## 2017-07-28 MED ORDER — OXYCODONE HCL ER 10 MG PO T12A: 10.0000 mg | EXTENDED_RELEASE_TABLET | Freq: Two times a day (BID) | ORAL | Status: DC

## 2017-07-28 MED ORDER — FENTANYL CITRATE (PF) 250 MCG/5ML IJ SOLN
INTRAMUSCULAR | Status: AC
  Filled 2017-07-28: qty 5

## 2017-07-28 MED ORDER — ONDANSETRON HCL 4 MG/2ML IJ SOLN: 4.0000 mg | Freq: Four times a day (QID) | INTRAMUSCULAR | Status: DC | PRN

## 2017-07-28 MED ORDER — MIDAZOLAM HCL 2 MG/2ML IJ SOLN
2.0000 mg | Freq: Once | INTRAMUSCULAR | Status: AC
  Administered 2017-07-28: 2 mg via INTRAVENOUS

## 2017-07-28 MED ORDER — AMLODIPINE BESYLATE 10 MG PO TABS
10.0000 mg | ORAL_TABLET | Freq: Every day | ORAL | Status: DC
Start: 2017-07-29 — End: 2017-07-30
  Administered 2017-07-29 – 2017-07-30 (×2): 10 mg via ORAL
  Filled 2017-07-28 (×2): qty 1

## 2017-07-28 MED ORDER — 0.9 % SODIUM CHLORIDE (POUR BTL) OPTIME
TOPICAL | Status: DC | PRN
  Administered 2017-07-28: 1000 mL

## 2017-07-28 MED ORDER — PROMETHAZINE HCL 25 MG PO TABS: 25.0000 mg | ORAL_TABLET | Freq: Four times a day (QID) | ORAL | 1 refills | Status: DC | PRN

## 2017-07-28 MED ORDER — ONDANSETRON HCL 4 MG PO TABS: 4.0000 mg | ORAL_TABLET | Freq: Three times a day (TID) | ORAL | 0 refills | Status: DC | PRN

## 2017-07-28 MED ORDER — DEXAMETHASONE SODIUM PHOSPHATE 10 MG/ML IJ SOLN
10.0000 mg | Freq: Once | INTRAMUSCULAR | Status: AC
  Administered 2017-07-29: 10 mg via INTRAVENOUS
  Filled 2017-07-28: qty 1

## 2017-07-28 MED ORDER — ATORVASTATIN CALCIUM 40 MG PO TABS
40.0000 mg | ORAL_TABLET | Freq: Every day | ORAL | Status: DC
  Administered 2017-07-28 – 2017-07-30 (×3): 40 mg via ORAL
  Filled 2017-07-28 (×3): qty 1

## 2017-07-28 MED ORDER — PROPOFOL 10 MG/ML IV BOLUS
INTRAVENOUS | Status: DC | PRN
  Administered 2017-07-28: 30 mg via INTRAVENOUS
  Administered 2017-07-28: 20 mg via INTRAVENOUS

## 2017-07-28 MED ORDER — CEFAZOLIN SODIUM-DEXTROSE 2-4 GM/100ML-% IV SOLN
2.0000 g | Freq: Four times a day (QID) | INTRAVENOUS | Status: AC
  Administered 2017-07-28 – 2017-07-29 (×3): 2 g via INTRAVENOUS
  Filled 2017-07-28 (×3): qty 100

## 2017-07-28 MED ORDER — ONDANSETRON HCL 4 MG PO TABS: 4.0000 mg | ORAL_TABLET | Freq: Four times a day (QID) | ORAL | Status: DC | PRN

## 2017-07-28 MED ORDER — LISINOPRIL 10 MG PO TABS
10.0000 mg | ORAL_TABLET | Freq: Every day | ORAL | Status: DC
  Administered 2017-07-29 – 2017-07-30 (×2): 10 mg via ORAL
  Filled 2017-07-28 (×2): qty 1

## 2017-07-28 MED ORDER — SODIUM CHLORIDE 0.9% FLUSH
INTRAVENOUS | Status: DC | PRN
  Administered 2017-07-28: 40 mL

## 2017-07-28 MED ORDER — DIPHENHYDRAMINE HCL 12.5 MG/5ML PO ELIX: 25.0000 mg | ORAL_SOLUTION | ORAL | Status: DC | PRN

## 2017-07-28 MED ORDER — HYDROMORPHONE HCL 2 MG/ML IJ SOLN
INTRAMUSCULAR | Status: AC
  Filled 2017-07-28: qty 1

## 2017-07-28 MED ORDER — LACTATED RINGERS IV SOLN
INTRAVENOUS | Status: DC
  Administered 2017-07-28: 11:00:00 via INTRAVENOUS

## 2017-07-28 MED ORDER — ROPIVACAINE HCL 7.5 MG/ML IJ SOLN
INTRAMUSCULAR | Status: DC | PRN
  Administered 2017-07-28: 20 mL via PERINEURAL

## 2017-07-28 MED ORDER — ALBUTEROL SULFATE (2.5 MG/3ML) 0.083% IN NEBU: 2.5000 mg | INHALATION_SOLUTION | Freq: Four times a day (QID) | RESPIRATORY_TRACT | Status: DC | PRN

## 2017-07-28 MED ORDER — OXYCODONE HCL 5 MG PO TABS
ORAL_TABLET | ORAL | Status: AC
  Filled 2017-07-28: qty 2

## 2017-07-28 MED ORDER — HYDROMORPHONE HCL 2 MG/ML IJ SOLN: 0.5000 mg | INTRAMUSCULAR | Status: DC | PRN

## 2017-07-28 MED ORDER — FENTANYL CITRATE (PF) 100 MCG/2ML IJ SOLN
INTRAMUSCULAR | Status: DC | PRN
  Administered 2017-07-28: 50 ug via INTRAVENOUS
  Administered 2017-07-28: 100 ug via INTRAVENOUS
  Administered 2017-07-28 (×2): 50 ug via INTRAVENOUS

## 2017-07-28 MED ORDER — HYDROMORPHONE HCL 1 MG/ML IJ SOLN
0.5000 mg | INTRAMUSCULAR | Status: DC | PRN
  Administered 2017-07-28: 1 mg via INTRAVENOUS

## 2017-07-28 MED ORDER — OXYCODONE HCL 5 MG PO TABS
5.0000 mg | ORAL_TABLET | ORAL | Status: DC | PRN
  Administered 2017-07-28 – 2017-07-29 (×3): 10 mg via ORAL
  Filled 2017-07-28 (×3): qty 2

## 2017-07-28 MED ORDER — METHOCARBAMOL 750 MG PO TABS: 750.0000 mg | ORAL_TABLET | Freq: Two times a day (BID) | ORAL | 0 refills | Status: DC | PRN

## 2017-07-28 MED ORDER — OXYCODONE HCL ER 10 MG PO T12A
10.0000 mg | EXTENDED_RELEASE_TABLET | Freq: Two times a day (BID) | ORAL | Status: DC
  Administered 2017-07-28 – 2017-07-30 (×4): 10 mg via ORAL
  Filled 2017-07-28 (×4): qty 1

## 2017-07-28 MED ORDER — OXYCODONE HCL 5 MG PO TABS
10.0000 mg | ORAL_TABLET | ORAL | Status: DC | PRN
  Administered 2017-07-30 (×2): 10 mg via ORAL
  Filled 2017-07-28: qty 2

## 2017-07-28 MED ORDER — METHOCARBAMOL 500 MG PO TABS
500.0000 mg | ORAL_TABLET | Freq: Four times a day (QID) | ORAL | Status: DC | PRN
  Administered 2017-07-28 – 2017-07-30 (×3): 500 mg via ORAL
  Filled 2017-07-28 (×3): qty 1

## 2017-07-28 MED ORDER — FENTANYL CITRATE (PF) 100 MCG/2ML IJ SOLN
50.0000 ug | Freq: Once | INTRAMUSCULAR | Status: AC
  Administered 2017-07-28: 50 ug via INTRAVENOUS

## 2017-07-28 MED ORDER — VANCOMYCIN HCL POWD
Status: DC | PRN
  Administered 2017-07-28: 1000 mg via SURGICAL_CAVITY

## 2017-07-28 MED ORDER — MENTHOL 3 MG MT LOZG: 1.0000 | LOZENGE | OROMUCOSAL | Status: DC | PRN

## 2017-07-28 MED ORDER — BUPIVACAINE IN DEXTROSE 0.75-8.25 % IT SOLN
INTRATHECAL | Status: DC | PRN
  Administered 2017-07-28: 2 mL via INTRATHECAL

## 2017-07-28 MED ORDER — CELECOXIB 200 MG PO CAPS
200.0000 mg | ORAL_CAPSULE | Freq: Two times a day (BID) | ORAL | Status: DC
  Administered 2017-07-28 – 2017-07-30 (×4): 200 mg via ORAL
  Filled 2017-07-28 (×4): qty 1

## 2017-07-28 MED ORDER — ACETAMINOPHEN 500 MG PO TABS
1000.0000 mg | ORAL_TABLET | Freq: Four times a day (QID) | ORAL | Status: AC
  Administered 2017-07-28 – 2017-07-29 (×4): 1000 mg via ORAL
  Filled 2017-07-28 (×4): qty 2

## 2017-07-28 MED ORDER — VANCOMYCIN HCL 1000 MG IV SOLR
INTRAVENOUS | Status: AC
  Filled 2017-07-28: qty 1000

## 2017-07-28 MED ORDER — PHENOL 1.4 % MT LIQD: 1.0000 | OROMUCOSAL | Status: DC | PRN

## 2017-07-28 SURGICAL SUPPLY — 69 items
ALCOHOL ISOPROPYL (RUBBING) (MISCELLANEOUS) ×3 IMPLANT
BAG DECANTER FOR FLEXI CONT (MISCELLANEOUS) ×3 IMPLANT
BANDAGE ESMARK 6X9 LF (GAUZE/BANDAGES/DRESSINGS) ×1 IMPLANT
BEARING TIBIAL INSERT SZ7 (Orthopedic Implant) ×1 IMPLANT
BENZOIN TINCTURE PRP APPL 2/3 (GAUZE/BANDAGES/DRESSINGS) ×3 IMPLANT
BLADE SAW SGTL 13.0X1.19X90.0M (BLADE) ×3 IMPLANT
BNDG ELASTIC 6X10 VLCR STRL LF (GAUZE/BANDAGES/DRESSINGS) ×3 IMPLANT
BNDG ESMARK 6X9 LF (GAUZE/BANDAGES/DRESSINGS) ×3
BOWL SMART MIX CTS (DISPOSABLE) ×3 IMPLANT
CLOSURE STERI-STRIP 1/2X4 (GAUZE/BANDAGES/DRESSINGS) ×2
CLSR STERI-STRIP ANTIMIC 1/2X4 (GAUZE/BANDAGES/DRESSINGS) ×4 IMPLANT
COVER SURGICAL LIGHT HANDLE (MISCELLANEOUS) ×3 IMPLANT
CUFF TOURNIQUET SINGLE 34IN LL (TOURNIQUET CUFF) ×3 IMPLANT
DRAPE EXTREMITY T 121X128X90 (DRAPE) ×3 IMPLANT
DRAPE HALF SHEET 40X57 (DRAPES) ×9 IMPLANT
DRAPE INCISE IOBAN 66X45 STRL (DRAPES) ×3 IMPLANT
DRAPE ORTHO SPLIT 77X108 STRL (DRAPES) ×4
DRAPE POUCH INSTRU U-SHP 10X18 (DRAPES) ×3 IMPLANT
DRAPE SURG 17X11 SM STRL (DRAPES) ×6 IMPLANT
DRAPE SURG ORHT 6 SPLT 77X108 (DRAPES) ×2 IMPLANT
DRSG AQUACEL AG ADV 3.5X14 (GAUZE/BANDAGES/DRESSINGS) ×3 IMPLANT
DRSG PAD ABDOMINAL 8X10 ST (GAUZE/BANDAGES/DRESSINGS) ×6 IMPLANT
DURAPREP 26ML APPLICATOR (WOUND CARE) ×9 IMPLANT
ELECT CAUTERY BLADE 6.4 (BLADE) ×3 IMPLANT
ELECT REM PT RETURN 9FT ADLT (ELECTROSURGICAL) ×3
ELECTRODE REM PT RTRN 9FT ADLT (ELECTROSURGICAL) ×1 IMPLANT
FEMORAL POST STABILIZED SZ7 RT (Joint) ×3 IMPLANT
GAUZE SPONGE 4X4 12PLY STRL (GAUZE/BANDAGES/DRESSINGS) ×6 IMPLANT
GLOVE BIOGEL PI IND STRL 7.0 (GLOVE) ×1 IMPLANT
GLOVE BIOGEL PI INDICATOR 7.0 (GLOVE) ×2
GLOVE ECLIPSE 7.0 STRL STRAW (GLOVE) ×3 IMPLANT
GLOVE SKINSENSE NS SZ7.5 (GLOVE) ×2
GLOVE SKINSENSE STRL SZ7.5 (GLOVE) ×1 IMPLANT
GLOVE SURG SS PI 6.5 STRL IVOR (GLOVE) ×6 IMPLANT
GLOVE SURG SYN 7.5  E (GLOVE) ×8
GLOVE SURG SYN 7.5 E (GLOVE) ×4 IMPLANT
GOWN STRL REIN XL XLG (GOWN DISPOSABLE) ×3 IMPLANT
GOWN STRL REUS W/ TWL LRG LVL3 (GOWN DISPOSABLE) ×1 IMPLANT
GOWN STRL REUS W/TWL LRG LVL3 (GOWN DISPOSABLE) ×2
HANDPIECE INTERPULSE COAX TIP (DISPOSABLE) ×2
HOOD PEEL AWAY FLYTE STAYCOOL (MISCELLANEOUS) ×6 IMPLANT
KIT BASIN OR (CUSTOM PROCEDURE TRAY) ×3 IMPLANT
KIT TURNOVER KIT B (KITS) ×3 IMPLANT
KNEE PATELLA ASYMMETRIC 10X35 (Knees) ×3 IMPLANT
KNEE TIBIAL COMPONENT SZ7 (Knees) ×3 IMPLANT
MANIFOLD NEPTUNE II (INSTRUMENTS) ×3 IMPLANT
MARKER SKIN DUAL TIP RULER LAB (MISCELLANEOUS) ×3 IMPLANT
NEEDLE SPNL 18GX3.5 QUINCKE PK (NEEDLE) ×3 IMPLANT
NS IRRIG 1000ML POUR BTL (IV SOLUTION) ×3 IMPLANT
PACK TOTAL JOINT (CUSTOM PROCEDURE TRAY) ×3 IMPLANT
PAD ARMBOARD 7.5X6 YLW CONV (MISCELLANEOUS) ×6 IMPLANT
PAD CAST 4YDX4 CTTN HI CHSV (CAST SUPPLIES) ×2 IMPLANT
PADDING CAST COTTON 4X4 STRL (CAST SUPPLIES) ×4
SAW OSC TIP CART 19.5X105X1.3 (SAW) ×3 IMPLANT
SET HNDPC FAN SPRY TIP SCT (DISPOSABLE) ×1 IMPLANT
STAPLER VISISTAT 35W (STAPLE) IMPLANT
SUCTION FRAZIER HANDLE 10FR (MISCELLANEOUS) ×2
SUCTION TUBE FRAZIER 10FR DISP (MISCELLANEOUS) ×1 IMPLANT
SUT MNCRL AB 4-0 PS2 18 (SUTURE) ×3 IMPLANT
SUT VIC AB 1 CTX 27 (SUTURE) ×9 IMPLANT
SUT VIC AB 2-0 CT1 27 (SUTURE) ×8
SUT VIC AB 2-0 CT1 TAPERPNT 27 (SUTURE) ×4 IMPLANT
SYR 50ML LL SCALE MARK (SYRINGE) ×3 IMPLANT
SYR CONTROL 10ML LL (SYRINGE) ×3 IMPLANT
TIBIAL BEARING INSERT SZ7 (Orthopedic Implant) ×3 IMPLANT
TOWEL OR 17X26 10 PK STRL BLUE (TOWEL DISPOSABLE) ×3 IMPLANT
TRAY CATH 16FR W/PLASTIC CATH (SET/KITS/TRAYS/PACK) ×3 IMPLANT
UNDERPAD 30X30 (UNDERPADS AND DIAPERS) ×3 IMPLANT
WRAP KNEE MAXI GEL POST OP (GAUZE/BANDAGES/DRESSINGS) ×3 IMPLANT

## 2017-07-28 NOTE — Anesthesia Procedure Notes (Signed)
Spinal  Patient location during procedure: OR Start time: 07/28/2017 1:13 PM End time: 07/28/2017 1:17 PM Staffing Anesthesiologist: Effie Berkshire, MD Performed: anesthesiologist  Preanesthetic Checklist Completed: patient identified, site marked, surgical consent, pre-op evaluation, timeout performed, IV checked, risks and benefits discussed and monitors and equipment checked Spinal Block Patient position: sitting Prep: DuraPrep Patient monitoring: heart rate, continuous pulse ox, blood pressure and cardiac monitor Approach: midline Location: L4-5 Injection technique: single-shot Needle Needle type: Introducer and Pencan  Needle gauge: 24 G Needle length: 9 cm Additional Notes Negative paresthesia. Negative blood return. Positive free-flowing CSF. Expiration date of kit checked and confirmed. Patient tolerated procedure well, without complications.

## 2017-07-28 NOTE — Transfer of Care (Signed)
Immediate Anesthesia Transfer of Care Note  Patient: Jeffrey [name]  Procedure(s) Performed: RIGHT TOTAL KNEE ARTHROPLASTY (Right Knee)  Patient Location: PACU  Anesthesia Type:Spinal  Level of Consciousness: awake and alert   Airway & Oxygen Therapy: Patient Spontanous Breathing and Patient connected to nasal cannula oxygen  Post-op Assessment: Report given to RN and Post -op Vital signs reviewed and stable  Post vital signs: Reviewed and stable  Last Vitals:  Vitals Value Taken Time  BP 135/94 07/28/2017  3:45 PM  Temp    Pulse 66 07/28/2017  3:48 PM  Resp 15 07/28/2017  3:48 PM  SpO2 94 % 07/28/2017  3:48 PM  Vitals shown include unvalidated device data.  Last Pain:  Vitals:   07/28/17 1118  TempSrc:   PainSc: 0-No pain         Complications: No apparent anesthesia complications

## 2017-07-28 NOTE — Progress Notes (Signed)
PACU stated that Jeffrey [name] during surgery they attempted to cath him and was unable his urine is bloody but with out pain and the amount of 500 cc Pt resting comfortable with no c/o

## 2017-07-28 NOTE — Anesthesia Postprocedure Evaluation (Signed)
Anesthesia Post Note  Patient: Jeffrey [name]  Procedure(s) Performed: RIGHT TOTAL KNEE ARTHROPLASTY (Right Knee)     Patient location during evaluation: PACU Anesthesia Type: Spinal Level of consciousness: oriented and awake and alert Pain management: pain level controlled Vital Signs Assessment: post-procedure vital signs reviewed and stable Respiratory status: spontaneous breathing, respiratory function stable and patient connected to nasal cannula oxygen Cardiovascular status: blood pressure returned to baseline and stable Postop Assessment: no headache, no backache and no apparent nausea or vomiting Anesthetic complications: no    Last Vitals:  Vitals:   07/28/17 1745 07/28/17 1747  BP: (!) 168/118 (!) 167/104  Pulse: 73 77  Resp: 15 14  Temp: 36.6 C   SpO2: 98% 100%    Last Pain:  Vitals:   07/28/17 1745  TempSrc:   PainSc: 5                  Naara Kelty COKER

## 2017-07-28 NOTE — Anesthesia Procedure Notes (Signed)
Anesthesia Regional Block: Adductor canal block   Pre-Anesthetic Checklist: ,, timeout performed, Correct Patient, Correct Site, Correct Laterality, Correct Procedure, Correct Position, site marked, Risks and benefits discussed,  Surgical consent,  Pre-op evaluation,  At surgeon's request and post-op pain management  Laterality: Right  Prep: chloraprep       Needles:  Injection technique: Single-shot  Needle Type: Echogenic Needle     Needle Length: 9cm  Needle Gauge: 21     Additional Needles:   Procedures:,,,, ultrasound used (permanent image in chart),,,,  Narrative:  Start time: 07/28/2017 12:20 PM End time: 07/28/2017 12:30 PM Injection made incrementally with aspirations every 5 mL.  Performed by: Personally  Anesthesiologist: Effie Berkshire, MD  Additional Notes: Patient tolerated the procedure well. Local anesthetic introduced in an incremental fashion under minimal resistance after negative aspirations. No paresthesias were elicited. After completion of the procedure, no acute issues were identified and patient continued to be monitored by RN.

## 2017-07-28 NOTE — Progress Notes (Signed)
Orthopedic Tech Progress Note Patient Details:  Jeffrey [name] June 14, 1965 [id number]  CPM Right Knee CPM Right Knee: On Right Knee Flexion (Degrees): 90 Right Knee Extension (Degrees): 0  Post Interventions Patient Tolerated: Well Instructions Provided: Care of device  Braulio Bosch 07/28/2017, 4:18 PM

## 2017-07-28 NOTE — Op Note (Signed)
Total Knee Arthroplasty Procedure Note  Preoperative diagnosis: Right knee osteoarthritis  Postoperative diagnosis:same  Operative procedure: Right total knee arthroplasty. CPT [id number]  Surgeon: N. Eduard Roux, MD  Assist: Madalyn Rob, PA-C; necessary for the timely completion of procedure and due to complexity of procedure.  Anesthesia: Spinal, regional  Tourniquet time: 75 mins  Implants used: [employer] Triatholon Uncemented Femur: PS 7 Tibia: 7 Patella: 35 mm, 10 thick Polyethylene: 9 mm  Indication: Jeffrey [name] is a 52 y.o. year old male with a history of knee pain. Having failed conservative management, the patient elected to proceed with a total knee arthroplasty.  We have reviewed the risk and benefits of the surgery and they elected to proceed after voicing understanding.  Procedure:  After informed consent was obtained and understanding of the risk were voiced including but not limited to bleeding, infection, damage to surrounding structures including nerves and vessels, blood clots, leg length inequality and the failure to achieve desired results, the operative extremity was marked with verbal confirmation of the patient in the holding area.   The patient was then brought to the operating room and transported to the operating room table in the supine position.  A tourniquet was applied to the operative extremity around the upper thigh. The operative limb was then prepped and draped in the usual sterile fashion and preoperative antibiotics were administered.  A time out was performed prior to the start of surgery confirming the correct extremity, preoperative antibiotic administration, as well as team members, implants and instruments available for the case. Correct surgical site was also confirmed with preoperative radiographs. The limb was then elevated for exsanguination and the tourniquet was inflated. A midline incision was made and a standard medial  parapatellar approach was performed.  The patella was prepared and sized to a 35 mm.  A cover was placed on the patella for protection from retractors.  We then turned our attention to the femur. Posterior cruciate ligament was sacrificed. Start site was drilled in the femur and the intramedullary distal femoral cutting guide was placed, set at 5 degrees valgus, taking 11 mm of distal resection. The distal cut was made. Osteophytes were then removed. Next, the proximal tibial cutting guide was placed with appropriate slope, varus/valgus alignment and depth of resection. The proximal tibial cut was made. Gap blocks were then used to assess the extension gap and alignment, and appropriate soft tissue releases were performed. Attention was turned back to the femur, which was sized using the sizing guide to a size 7. Appropriate rotation of the femoral component was determined using epicondylar axis, Whiteside's line, and assessing the flexion gap under ligament tension. The appropriate size 4-in-1 cutting block was placed and cuts were made. Posterior femoral osteophytes and uncapped bone were then removed with the curved osteotome. The tibia was sized for a size 7 component. The femoral box-cutting guide was placed and prepared for a PS femoral component. Trial components were placed, and stability was checked in full extension, mid-flexion, and deep flexion. Proper tibial rotation was determined and marked.  The patella tracked well without a lateral release.  Trial components were then removed and tibial preparation performed. A posterior capsular injection comprising of 20 cc of 1.3% exparel and 40 cc of normal saline was performed for postoperative pain control. The bony surfaces were irrigated with a pulse lavage and then dried. The final components sized above were malleted into place. The stability of the construct was re-evaluated throughout a range  of motion and found to be acceptable. The trial liner was  removed, the knee was copiously irrigated, and the knee was re-evaluated for any excess bone debris. The real polyethylene liner, 9 mm thick, was inserted and checked to ensure the locking mechanism had engaged appropriately. The tourniquet was deflated and hemostasis was achieved. The wound was irrigated with normal saline.  One gram of vancomycin powder was placed in the surgical bed.  A drain was not placed.  Capsular closure was performed with a #1 vicryl, subcutaneous fat closed with a 0 vicryl suture, then subcutaneous tissue closed with interrupted 2.0 vicryl suture. The skin was then closed with a 3.0 monocryl. A sterile dressing was applied.  The patient was awakened in the operating room and taken to recovery in stable condition. All sponge, needle, and instrument counts were correct at the end of the case.  Position: supine  Complications: none.  Time Out: performed   Drains/Packing: none  Estimated blood loss: minimal  Returned to Recovery Room: in good condition.   Antibiotics: yes   Mechanical VTE (DVT) Prophylaxis: sequential compression devices, TED thigh-high  Chemical VTE (DVT) Prophylaxis: lovenox bridge to eliquis  Fluid Replacement  Crystalloid: see anesthesia record Blood: none  FFP: none   Specimens Removed: 1 to pathology   Sponge and Instrument Count Correct? yes   PACU: portable radiograph - knee AP and Lateral   Admission: inpatient status  Plan/RTC: Return in 2 weeks for wound check.   Weight Bearing/Load Lower Extremity: full   N. Eduard Roux, MD [place] 2:57 PM

## 2017-07-28 NOTE — H&P (Signed)
PREOPERATIVE H&P  Chief Complaint: right knee osteoarthritis  HPI: Jeffrey [name] is a 52 y.o. male who presents for surgical treatment of right knee osteoarthritis.  He denies any changes in medical history.  Past Medical History:  Diagnosis Date  . Acute pulmonary embolus (HCC)    bilateral submassive PE in setting of missing several doses of Xarelto for his DVT  . Arthritis   . DVT (deep venous thrombosis) ([facility]) 11/28/2013   RT LEG  . High cholesterol   . Hypertension   . Marijuana use    Past Surgical History:  Procedure Laterality Date  . HERNIA REPAIR     1610 and 9604; umbilical hernia repair  . KNEE SURGERY Bilateral    Left knee 1993, right knee 2004  . TONSILLECTOMY    . TOTAL KNEE ARTHROPLASTY Left 02/10/2017   Procedure: LEFT TOTAL KNEE ARTHROPLASTY;  Surgeon: Leandrew Koyanagi, MD;  Location: [facility];  Service: Orthopedics;  Laterality: Left;   Social History   Socioeconomic History  . Marital status: Married    Spouse name: Not on file  . Number of children: Not on file  . Years of education: Not on file  . Highest education level: Not on file  Occupational History  . Not on file  Social Needs  . Financial resource strain: Not on file  . Food insecurity:    Worry: Not on file    Inability: Not on file  . Transportation needs:    Medical: Not on file    Non-medical: Not on file  Tobacco Use  . Smoking status: Current Every Day Smoker    Packs/day: 0.25    Years: 35.00    Pack years: 8.75    Types: Cigarettes    Start date: 10/07/1976  . Smokeless tobacco: Never Used  . Tobacco comment: Stopped for up to 5 years total - Peak rate 2ppd  Substance and Sexual Activity  . Alcohol use: Yes    Alcohol/week: 7.2 oz    Types: 12 Cans of beer per week    Comment: seldom wine/liquor  . Drug use: Yes    Types: Marijuana, Cocaine    Comment: Cocaine last used in 2004, current marijuana use  . Sexual activity: Yes    Birth control/protection: None   Comment: With wife  Lifestyle  . Physical activity:    Days per week: Not on file    Minutes per session: Not on file  . Stress: Not on file  Relationships  . Social connections:    Talks on phone: Not on file    Gets together: Not on file    Attends religious service: Not on file    Active member of club or organization: Not on file    Attends meetings of clubs or organizations: Not on file    Relationship status: Not on file  Other Topics Concern  . Not on file  Social History Narrative   Lives in [place].   Chickens as pet.    Hobbies: Radiographer, therapeutic Pulmonary (11/24/16):   Originally from [place]. Has worked in Architect, Ambulance person, Press photographer, & in a warehouse. No known asbestos exposure. Previously had chickens as pets. No mold exposure.    Family History  Problem Relation Age of Onset  . Heart disease Other   . Arthritis Other   . Alcohol abuse Mother   . Heart disease Mother   . Depression Mother   . Hypertension  Mother   . Kidney disease Mother   . Arthritis Mother   . Clotting disorder Mother   . Arthritis Father   . Alcohol abuse Brother   . Drug abuse Brother   . Sickle cell anemia Brother   . Arthritis Maternal Grandmother   . Heart disease Maternal Grandmother   . Arthritis Maternal Grandfather   . Heart disease Maternal Grandfather   . Asthma Cousin    No Known Allergies Prior to Admission medications   Medication Sig Start Date End Date Taking? Authorizing Provider  amLODipine (NORVASC) 10 MG tablet Take 1 tablet (10 mg total) by mouth daily. 11/16/16  Yes Mikell, Jeani Sow, MD  apixaban (ELIQUIS) 5 MG TABS tablet Take 1 tablet (5 mg total) by mouth 2 (two) times daily. 11/16/16  Yes Mikell, Jeani Sow, MD  atorvastatin (LIPITOR) 40 MG tablet TAKE 1 TABLET BY MOUTH DAILY. 06/10/17  Yes Mikell, Jeani Sow, MD  tiZANidine (ZANAFLEX) 4 MG tablet Take 1 tablet (4 mg total) by mouth every 6 (six) hours as needed for muscle spasms. 02/23/17   Yes Leandrew Koyanagi, MD  traMADol (ULTRAM) 50 MG tablet TAKE 1 TABLET BY MOUTH EVERY 8 HOURS Patient taking differently: TAKE 1 TABLET BY MOUTH EVERY 8 HOURS AS NEEDED FOR PAIN. 06/10/17  Yes Mikell, Jeani Sow, MD  albuterol (PROVENTIL HFA;VENTOLIN HFA) 108 (90 Base) MCG/ACT inhaler Inhale 2 puffs into the lungs every 6 (six) hours as needed for wheezing or shortness of breath. 12/31/16   Mikell, Jeani Sow, MD  enoxaparin (LOVENOX) 120 MG/0.8ML injection Inject 0.8 mLs (120 mg total) into the skin every 12 (twelve) hours. 07/21/17   Sueanne Margarita, MD  lisinopril (PRINIVIL,ZESTRIL) 10 MG tablet Take 1 tablet (10 mg total) by mouth daily. 07/21/17 10/19/17  Imogene Burn, PA-C  mupirocin ointment (BACTROBAN) 2 % Apply 1 application topically 2 (two) times daily. 03/25/17   Leandrew Koyanagi, MD  ondansetron (ZOFRAN) 4 MG tablet Take 1-2 tablets (4-8 mg total) every 8 (eight) hours as needed by mouth for nausea or vomiting. 02/10/17   Leandrew Koyanagi, MD  oxyCODONE (OXYCONTIN) 10 mg 12 hr tablet Take 1 tablet (10 mg total) every 12 (twelve) hours by mouth. 02/10/17   Leandrew Koyanagi, MD  oxyCODONE-acetaminophen (PERCOCET) 5-325 MG tablet Take 1-2 tablets every 8 (eight) hours as needed by mouth for severe pain. 02/12/17   Leandrew Koyanagi, MD  promethazine (PHENERGAN) 25 MG tablet Take 1 tablet (25 mg total) every 6 (six) hours as needed by mouth for nausea. 02/10/17   Leandrew Koyanagi, MD  senna-docusate (SENOKOT S) 8.6-50 MG tablet Take 1 tablet at bedtime as needed by mouth. 02/10/17   Leandrew Koyanagi, MD     Positive ROS: All other systems have been reviewed and were otherwise negative with the exception of those mentioned in the HPI and as above.  Physical Exam: General: Alert, no acute distress Cardiovascular: No pedal edema Respiratory: No cyanosis, no use of accessory musculature GI: abdomen soft Skin: No lesions in the area of chief complaint Neurologic: Sensation intact distally Psychiatric:  Patient is competent for consent with normal mood and affect Lymphatic: no lymphedema  MUSCULOSKELETAL: exam stable  Assessment: right knee osteoarthritis  Plan: Plan for Procedure(s): RIGHT TOTAL KNEE ARTHROPLASTY  The risks benefits and alternatives were discussed with the patient including but not limited to the risks of nonoperative treatment, versus surgical intervention including infection, bleeding, nerve injury,  blood clots, cardiopulmonary complications,  morbidity, mortality, among others, and they were willing to proceed.   Patient's anticipated LOS is less than 2 midnights, meeting these requirements: - Younger than 67 - Lives within 1 hour of care - Has a competent adult at home to recover with post-op recover  Eduard Roux, MD   07/28/2017 8:22 AM

## 2017-07-28 NOTE — Anesthesia Preprocedure Evaluation (Addendum)
Anesthesia Evaluation  Patient identified by MRN, date of birth, ID band Patient awake    Reviewed: Allergy & Precautions, NPO status , Patient's Chart, lab work & pertinent test results  Airway Mallampati: I  TM Distance: >3 FB Neck ROM: Full    Dental  (+) Dental Advisory Given, Chipped,    Pulmonary Current Smoker,    breath sounds clear to auscultation       Cardiovascular hypertension, Pt. on medications + CAD   Rhythm:Regular Rate:Normal     Neuro/Psych negative neurological ROS  negative psych ROS   GI/Hepatic negative GI ROS, Neg liver ROS,   Endo/Other  negative endocrine ROS  Renal/GU negative Renal ROS     Musculoskeletal  (+) Arthritis ,   Abdominal Normal abdominal exam  (+)   Peds  Hematology negative hematology ROS (+)   Anesthesia Other Findings   Reproductive/Obstetrics                            Lab Results  Component Value Date   WBC 5.8 07/19/2017   HGB 15.1 07/19/2017   HCT 44.4 07/19/2017   MCV 87.6 07/19/2017   PLT 222 07/19/2017   Lab Results  Component Value Date   CREATININE 0.89 07/19/2017   BUN 9 07/19/2017   NA 135 07/19/2017   K 4.3 07/19/2017   CL 104 07/19/2017   CO2 20 (L) 07/19/2017   Lab Results  Component Value Date   INR 1.02 07/19/2017   INR 1.08 02/03/2017   INR 1.50 06/03/2016     Anesthesia Physical Anesthesia Plan  ASA: II  Anesthesia Plan: Spinal   Post-op Pain Management:  Regional for Post-op pain   Induction: Intravenous  PONV Risk Score and Plan: Propofol infusion, Dexamethasone and Ondansetron  Airway Management Planned: Simple Face Mask  Additional Equipment: None  Intra-op Plan:   Post-operative Plan:   Informed Consent: I have reviewed the patients History and Physical, chart, labs and discussed the procedure including the risks, benefits and alternatives for the proposed anesthesia with the patient or  authorized representative who has indicated his/her understanding and acceptance.   Dental advisory given  Plan Discussed with: CRNA  Anesthesia Plan Comments:        Anesthesia Quick Evaluation

## 2017-07-29 LAB — BASIC METABOLIC PANEL
Anion gap: 9 (ref 5–15)
BUN: <5 mg/dL — ABNORMAL LOW (ref 6–20)
CHLORIDE: 96 mmol/L — AB (ref 101–111)
CO2: 25 mmol/L (ref 22–32)
Calcium: 8 mg/dL — ABNORMAL LOW (ref 8.9–10.3)
Creatinine, Ser: 0.84 mg/dL (ref 0.61–1.24)
GFR calc Af Amer: >60 mL/min (ref >60)
Glucose, Bld: 163 mg/dL — ABNORMAL HIGH (ref 65–99)
POTASSIUM: 3.4 mmol/L — AB (ref 3.5–5.1)
SODIUM: 130 mmol/L — AB (ref 135–145)

## 2017-07-29 LAB — CBC
HCT: 39.6 % (ref 39.0–52.0)
HEMOGLOBIN: 13 g/dL (ref 13.0–17.0)
MCH: 29.1 pg (ref 26.0–34.0)
MCHC: 32.8 g/dL (ref 30.0–36.0)
MCV: 88.6 fL (ref 78.0–100.0)
PLATELETS: 156 10*3/uL (ref 150–400)
RBC: 4.47 MIL/uL (ref 4.22–5.81)
RDW: 14.4 % (ref 11.5–15.5)
WBC: 6.3 10*3/uL (ref 4.0–10.5)

## 2017-07-29 MED ORDER — OXYCODONE-ACETAMINOPHEN 5-325 MG PO TABS: 1.0000 | ORAL_TABLET | ORAL | 0 refills | Status: DC | PRN

## 2017-07-29 NOTE — Progress Notes (Signed)
Subjective: 1 Day Post-Op Procedure(s) (LRB): RIGHT TOTAL KNEE ARTHROPLASTY (Right) Patient reports pain as mild.  Doing great this am.    Objective: Vital signs in last 24 hours: Temp:  [97.5 F (36.4 C)-98.5 F (36.9 C)] 98.5 F (36.9 C) (05/02 0522) Pulse Rate:  [62-91] 69 (05/02 0522) Resp:  [11-22] 18 (05/02 0522) BP: (131-169)/(88-118) 145/88 (05/02 0522) SpO2:  [94 %-100 %] 100 % (05/02 0522) Weight:  [264 lb (119.7 kg)] 264 lb (119.7 kg) (05/01 1118)  Intake/Output from previous day: 05/01 0701 - 05/02 0700 In: 1800 [I.V.:1800] Out: 1000 [Urine:1000] Intake/Output this shift: No intake/output data recorded.  Recent Labs    07/29/17 0344  HGB 13.0   Recent Labs    07/29/17 0344  WBC 6.3  RBC 4.47  HCT 39.6  PLT 156   Recent Labs    07/29/17 0344  NA 130*  K 3.4*  CL 96*  CO2 25  BUN <5*  CREATININE 0.84  GLUCOSE 163*  CALCIUM 8.0*   Recent Labs    07/28/17 1131  INR 0.99    Neurologically intact Neurovascular intact Sensation intact distally Intact pulses distally Dorsiflexion/Plantar flexion intact Incision: dressing C/D/I No cellulitis present Compartment soft  Anticipated LOS equal to or greater than 2 midnights due to - Age 19 and older with one or more of the following:  - Obesity  - Expected need for hospital services (PT, OT, Nursing) required for safe  discharge  - Anticipated need for postoperative skilled nursing care or inpatient rehab  - Active co-morbidities: None OR   - Unanticipated findings during/Post Surgery: None  - Patient is a high risk of re-admission due to: None   Assessment/Plan: 1 Day Post-Op Procedure(s) (LRB): RIGHT TOTAL KNEE ARTHROPLASTY (Right) Advance diet Up with therapy D/C IV fluids Plan for discharge tomorrow  WBAT RLE Continue Eliquis for dvt ppx     Aundra Dubin 07/29/2017, 7:46 AM

## 2017-07-29 NOTE — Progress Notes (Signed)
Physical Therapy Treatment Patient Details Name: Jeffrey [name] MRN: [id number] DOB: 06-01-1965 Today's Date: 07/29/2017    History of Present Illness 52 y.o. male admitted on 07/28/17 for elective TKA.  Pt with significant PMH of L TKA 12/2016, DVT/PE on eliquis.      PT Comments    Today's session focused on gait and stairs.  Pt could verbalize and demonstrate proper technique from previous knee surgery for gait and stairs.  He was able to walk the entire floor with supervision and RW.  Pt was Min guard during stairs for safety.  Expect him to only need supervision in future sessions.  Finished going over HEP and reviewed knee precautions.  Pt is due to d/c home in the AM (06/30/17) per MD note.  Pt will benefit from continuing POC to regain functional independence.     Follow Up Recommendations  Follow surgeon's recommendation for DC plan and follow-up therapies;Supervision for mobility/OOB     Equipment Recommendations  None recommended by PT    Recommendations for Other Services       Precautions / Restrictions Precautions Precautions: Knee Precaution Booklet Issued: Yes (comment) Precaution Comments: knee exercise handout given and reviewed. Restrictions Weight Bearing Restrictions: Yes RLE Weight Bearing: Weight bearing as tolerated    Mobility  Bed Mobility Overal bed mobility: Modified Independent                Transfers Overall transfer level: Needs assistance   Transfers: Sit to/from Stand Sit to Stand: Min guard         General transfer comment: Min guard assist for safety due to low surface transfer and tall patient.  He reports his bed at home is high.   Ambulation/Gait Ambulation/Gait assistance: Min guard;Supervision Ambulation Distance (Feet): 600 Feet Assistive device: Rolling walker (2 wheeled) Gait Pattern/deviations: Step-through pattern;Antalgic     General Gait Details: good heel to toe pattern, min guard assit for safety initially  transitioned to supervision.    Stairs Stairs: Yes Stairs assistance: Min guard     General stair comments: Pt was able to verbalize correct LE sequence and demonstrate.  Min Guard for safety.   Wheelchair Mobility    Modified Rankin (Stroke Patients Only)       Balance Overall balance assessment: Needs assistance Sitting-balance support: Feet supported;No upper extremity supported       Standing balance support: No upper extremity supported;Bilateral upper extremity supported                                Cognition Arousal/Alertness: Awake/alert Behavior During Therapy: WFL for tasks assessed/performed Overall Cognitive Status: Within Functional Limits for tasks assessed                                        Exercises Total Joint Exercises Ankle Circles/Pumps: AROM;Both;20 reps;Seated Straight Leg Raises: AROM;Right;10 reps;Seated Long Arc Quad: AROM;Right;10 reps;Seated General Exercises - Lower Extremity Heel Slides: AAROM;Right;10 reps;Seated    General Comments        Pertinent Vitals/Pain Pain Assessment: 0-10 Pain Score: 7  Pain Location: right Pain Descriptors / Indicators: Aching Pain Intervention(s): Limited activity within patient's tolerance;Monitored during session;Repositioned    Home Living                      Prior Function  PT Goals (current goals can now be found in the care plan section) Acute Rehab PT Goals Patient Stated Goal: to get this knee done so he can keep up with his 42 y.o. son PT Goal Formulation: With patient Time For Goal Achievement: 08/05/17 Potential to Achieve Goals: Good Progress towards PT goals: Progressing toward goals    Frequency    7X/week      PT Plan      Co-evaluation              AM-PAC PT "6 Clicks" Daily Activity  Outcome Measure  Difficulty turning over in bed (including adjusting bedclothes, sheets and blankets)?:  None Difficulty moving from lying on back to sitting on the side of the bed? : None Difficulty sitting down on and standing up from a chair with arms (e.g., wheelchair, bedside commode, etc,.)?: A Little Help needed moving to and from a bed to chair (including a wheelchair)?: A Little Help needed walking in hospital room?: A Little Help needed climbing 3-5 steps with a railing? : A Little 6 Click Score: 20    End of Session Equipment Utilized During Treatment: Gait belt Activity Tolerance: Patient tolerated treatment well Patient left: in bed;in CPM   PT Visit Diagnosis: Muscle weakness (generalized) (M62.81);Difficulty in walking, not elsewhere classified (R26.2);Pain Pain - Right/Left: Right Pain - part of body: Knee     Time: 1349-1411 PT Time Calculation (min) (ACUTE ONLY): 22 min  Charges:  $Gait Training: 8-22 mins                    G Codes:       Terri Skains, SPTA [phone]    Terri Skains 07/29/2017, 4:59 PM

## 2017-07-29 NOTE — Discharge Instructions (Signed)
INSTRUCTIONS AFTER JOINT REPLACEMENT   o Remove items at home which could result in a fall. This includes throw rugs or furniture in walking pathways o ICE to the affected joint every three hours while awake for 30 minutes at a time, for at least the first 3-5 days, and then as needed for pain and swelling.  Continue to use ice for pain and swelling. You may notice swelling that will progress down to the foot and ankle.  This is normal after surgery.  Elevate your leg when you are not up walking on it.   o Continue to use the breathing machine you got in the hospital (incentive spirometer) which will help keep your temperature down.  It is common for your temperature to cycle up and down following surgery, especially at night when you are not up moving around and exerting yourself.  The breathing machine keeps your lungs expanded and your temperature down.   DIET:  As you were doing prior to hospitalization, we recommend a well-balanced diet.  DRESSING / WOUND CARE / SHOWERING  You may change your surgical dressing 7 days after surgery.  Then change the dressing every day with sterile gauze.  Please use good hand washing techniques before changing the dressing.  Do not use any lotions or creams on the incision until instructed by your surgeon.  You may shower while you have the surgical dressing which is waterproof.  After removal of surgical dressing, you must cover the incision when showering.  ACTIVITY  o Increase activity slowly as tolerated, but follow the weight bearing instructions below.   o No driving for 6 weeks or until further direction given by your physician.  You cannot drive while taking narcotics.  o No lifting or carrying greater than 10 lbs. until further directed by your surgeon. o Avoid periods of inactivity such as sitting longer than an hour when not asleep. This helps prevent blood clots.  o You may return to work once you are authorized by your doctor.     WEIGHT  BEARING   Weight bearing as tolerated with assist device (walker, cane, etc) as directed, use it as long as suggested by your surgeon or therapist, typically at least 4-6 weeks.   EXERCISES  Results after joint replacement surgery are often greatly improved when you follow the exercise, range of motion and muscle strengthening exercises prescribed by your doctor. Safety measures are also important to protect the joint from further injury. Any time any of these exercises cause you to have increased pain or swelling, decrease what you are doing until you are comfortable again and then slowly increase them. If you have problems or questions, call your caregiver or physical therapist for advice.   Rehabilitation is important following a joint replacement. After just a few days of immobilization, the muscles of the leg can become weakened and shrink (atrophy).  These exercises are designed to build up the tone and strength of the thigh and leg muscles and to improve motion. Often times heat used for twenty to thirty minutes before working out will loosen up your tissues and help with improving the range of motion but do not use heat for the first two weeks following surgery (sometimes heat can increase post-operative swelling).   These exercises can be done on a training (exercise) mat, on the floor, on a table or on a bed. Use whatever works the best and is most comfortable for you.    Use music or television while  you are exercising so that the exercises are a pleasant break in your day. This will make your life better with the exercises acting as a break in your routine that you can look forward to.   Perform all exercises about fifteen times, three times per day or as directed.  You should exercise both the operative leg and the other leg as well. ° °Exercises include: °  °• Quad Sets - Tighten up the muscle on the front of the thigh (Quad) and hold for 5-10 seconds.   °• Straight Leg Raises - With your  knee straight (if you were given a brace, keep it on), lift the leg to 60 degrees, hold for 3 seconds, and slowly lower the leg.  Perform this exercise against resistance later as your leg gets stronger.  °• Leg Slides: Lying on your back, slowly slide your foot toward your buttocks, bending your knee up off the floor (only go as far as is comfortable). Then slowly slide your foot back down until your leg is flat on the floor again.  °• Angel Wings: Lying on your back spread your legs to the side as far apart as you can without causing discomfort.  °• Hamstring Strength:  Lying on your back, push your heel against the floor with your leg straight by tightening up the muscles of your buttocks.  Repeat, but this time bend your knee to a comfortable angle, and push your heel against the floor.  You may put a pillow under the heel to make it more comfortable if necessary.  ° °A rehabilitation program following joint replacement surgery can speed recovery and prevent re-injury in the future due to weakened muscles. Contact your doctor or a physical therapist for more information on knee rehabilitation.  ° ° °CONSTIPATION ° °Constipation is defined medically as fewer than three stools per week and severe constipation as less than one stool per week.  Even if you have a regular bowel pattern at home, your normal regimen is likely to be disrupted due to multiple reasons following surgery.  Combination of anesthesia, postoperative narcotics, change in appetite and fluid intake all can affect your bowels.  ° °YOU MUST use at least one of the following options; they are listed in order of increasing strength to get the job done.  They are all available over the counter, and you may need to use some, POSSIBLY even all of these options:   ° °Drink plenty of fluids (prune juice may be helpful) and high fiber foods °Colace 100 mg by mouth twice a day  °Senokot for constipation as directed and as needed Dulcolax (bisacodyl), take  with full glass of water  °Miralax (polyethylene glycol) once or twice a day as needed. ° °If you have tried all these things and are unable to have a bowel movement in the first 3-4 days after surgery call either your surgeon or your primary doctor.   ° °If you experience loose stools or diarrhea, hold the medications until you stool forms back up.  If your symptoms do not get better within 1 week or if they get worse, check with your doctor.  If you experience "the worst abdominal pain ever" or develop nausea or vomiting, please contact the office immediately for further recommendations for treatment. ° ° °ITCHING:  If you experience itching with your medications, try taking only a single pain pill, or even half a pain pill at a time.  You can also use Benadryl over the counter   for itching or also to help with sleep.   TED HOSE STOCKINGS:  Use stockings on both legs until for at least 2 weeks or as directed by physician office. They may be removed at night for sleeping.  MEDICATIONS:  See your medication summary on the After Visit Summary that nursing will review with you.  You may have some home medications which will be placed on hold until you complete the course of blood thinner medication.  It is important for you to complete the blood thinner medication as prescribed.  PRECAUTIONS:  If you experience chest pain or shortness of breath - call 911 immediately for transfer to the hospital emergency department.   If you develop a fever greater that 101 F, purulent drainage from wound, increased redness or drainage from wound, foul odor from the wound/dressing, or calf pain - CONTACT YOUR SURGEON.                                                   FOLLOW-UP APPOINTMENTS:  If you do not already have a post-op appointment, please call the office for an appointment to be seen by your surgeon.  Guidelines for how soon to be seen are listed in your After Visit Summary, but are typically between 1-4 weeks  after surgery.  OTHER INSTRUCTIONS:   Knee Replacement:  Do not place pillow under knee, focus on keeping the knee straight while resting. CPM instructions: 0-90 degrees, 2 hours in the morning, 2 hours in the afternoon, and 2 hours in the evening. Place foam block, curve side up under heel at all times except when in CPM or when walking.  DO NOT modify, tear, cut, or change the foam block in any way.  MAKE SURE YOU:   Understand these instructions.   Get help right away if you are not doing well or get worse.    Thank you for letting us be a part of your medical care team.  It is a privilege we respect greatly.  We hope these instructions will help you stay on track for a fast and full recovery!     Information on my medicine - ELIQUIS (apixaban)  This medication education was reviewed with me or my healthcare representative as part of my discharge preparation.  The pharmacist that spoke with me during my hospital stay was:  Saundra Shelling, [facility]  Why was Eliquis prescribed for you? Eliquis was prescribed to treat blood clots that may have been found in the veins of your legs (deep vein thrombosis) or in your lungs (pulmonary embolism) and to reduce the risk of them occurring again.  What do You need to know about Eliquis ? The dose is ONE 5 mg tablet taken TWICE daily.  Eliquis may be taken with or without food.   Try to take the dose about the same time in the morning and in the evening. If you have difficulty swallowing the tablet whole please discuss with your pharmacist how to take the medication safely.  Take Eliquis exactly as prescribed and DO NOT stop taking Eliquis without talking to the doctor who prescribed the medication.  Stopping may increase your risk of developing a new blood clot.  Refill your prescription before you run out.  After discharge, you should have regular check-up appointments with your healthcare provider that is prescribing your  Eliquis.      What do you do if you miss a dose? If a dose of ELIQUIS is not taken at the scheduled time, take it as soon as possible on the same day and twice-daily administration should be resumed. The dose should not be doubled to make up for a missed dose.  Important Safety Information A possible side effect of Eliquis is bleeding. You should call your healthcare provider right away if you experience any of the following: ? Bleeding from an injury or your nose that does not stop. ? Unusual colored urine (red or dark brown) or unusual colored stools (red or black). ? Unusual bruising for unknown reasons. ? A serious fall or if you hit your head (even if there is no bleeding).  Some medicines may interact with Eliquis and might increase your risk of bleeding or clotting while on Eliquis. To help avoid this, consult your healthcare provider or pharmacist prior to using any new prescription or non-prescription medications, including herbals, vitamins, non-steroidal anti-inflammatory drugs (NSAIDs) and supplements.  This website has more information on Eliquis (apixaban): http://www.eliquis.com/eliquis/home

## 2017-07-29 NOTE — Evaluation (Signed)
Physical Therapy Evaluation Patient Details Name: Jeffrey [name] MRN: [id number] DOB: 1966-02-03 Today's Date: 07/29/2017   History of Present Illness  52 y.o. male admitted on 07/28/17 for elective TKA.  Pt with significant PMH of L TKA 12/2016, DVT/PE on eliquis.    Clinical Impression  Pt is POD #1 and is moving very well.  He was able to walk a great distance down the hallway with supervision and RW.  HEP program and knee precaution education reviewed.  We plan to practice the stairs this PM and he is due to d/c home in the AM (06/30/17) per MD note.   PT to follow acutely for deficits listed below.       Follow Up Recommendations Follow surgeon's recommendation for DC plan and follow-up therapies;Supervision for mobility/OOB    Equipment Recommendations  None recommended by PT    Recommendations for Other Services   NA    Precautions / Restrictions Precautions Precautions: Knee Precaution Booklet Issued: Yes (comment) Precaution Comments: knee exercise handout given and reviewed. Restrictions Weight Bearing Restrictions: Yes RLE Weight Bearing: Weight bearing as tolerated      Mobility  Bed Mobility Overal bed mobility: Modified Independent                Transfers Overall transfer level: Needs assistance   Transfers: Sit to/from Stand Sit to Stand: Min guard         General transfer comment: Min guard assist for safety due to low surface transfer and tall patient.  He reports his bed at home is high.   Ambulation/Gait Ambulation/Gait assistance: Min guard;Supervision Ambulation Distance (Feet): 300 Feet Assistive device: Rolling walker (2 wheeled) Gait Pattern/deviations: Step-through pattern;Antalgic     General Gait Details: RW adjusted to fit height, good heel to toe pattern, min guard assit for safety initially transitioned to supervision.   Stairs Stairs: Yes       General stair comments: Pt was able to verbalize correct LE sequence, will  physically practice this PM.          Balance Overall balance assessment: Needs assistance Sitting-balance support: Feet supported;No upper extremity supported Sitting balance-Leahy Scale: Good     Standing balance support: No upper extremity supported;Bilateral upper extremity supported Standing balance-Leahy Scale: Fair                               Pertinent Vitals/Pain Pain Assessment: 0-10 Pain Score: 8  Pain Location: right Pain Descriptors / Indicators: Aching Pain Intervention(s): Limited activity within patient's tolerance;Monitored during session;Repositioned    Home Living Family/patient expects to be discharged to:: Private residence Living Arrangements: Spouse/significant other;Children(5 y.o. and 53 y.o. ) Available Help at Discharge: Family;Available 24 hours/day Type of Home: House Home Access: Stairs to enter Entrance Stairs-Rails: None Entrance Stairs-Number of Steps: 2 Home Layout: One level Home Equipment: Financial trader - single point;Tub bench;Grab bars - tub/shower;Walker - 2 wheels;Bedside commode      Prior Function Level of Independence: Independent               Hand Dominance   Dominant Hand: Right    Extremity/Trunk Assessment   Upper Extremity Assessment Upper Extremity Assessment: Overall WFL for tasks assessed    Lower Extremity Assessment Lower Extremity Assessment: RLE deficits/detail RLE Deficits / Details: right leg with normal post op pain and weakness, ankle 3/5, knee 3-/5 hip flex 3-/5 at least per gross functional assessment.  RLE Sensation: WNL    Cervical / Trunk Assessment Cervical / Trunk Assessment: Normal  Communication   Communication: No difficulties  Cognition Arousal/Alertness: Awake/alert Behavior During Therapy: WFL for tasks assessed/performed Overall Cognitive Status: Within Functional Limits for tasks assessed                                            Exercises Total Joint Exercises Ankle Circles/Pumps: AROM;Both;10 reps Short Arc Quad: AROM;Right;10 reps Hip ABduction/ADduction: AROM;Right;10 reps Straight Leg Raises: AROM;Right;10 reps Long Arc Quad: AROM;Right;10 reps Knee Flexion: AROM;Right;10 reps Goniometric ROM: 8-93   Assessment/Plan    PT Assessment Patient needs continued PT services  PT Problem List Decreased strength;Decreased range of motion;Decreased activity tolerance;Decreased mobility;Decreased balance;Decreased knowledge of use of DME;Decreased knowledge of precautions;Pain       PT Treatment Interventions DME instruction;Gait training;Stair training;Therapeutic activities;Functional mobility training;Therapeutic exercise;Balance training;Patient/family education;Manual techniques;Modalities    PT Goals (Current goals can be found in the Care Plan section)  Acute Rehab PT Goals Patient Stated Goal: to get this knee done so he can keep up with his 53 y.o. son PT Goal Formulation: With patient Time For Goal Achievement: 08/05/17 Potential to Achieve Goals: Good    Frequency 7X/week           AM-PAC PT "6 Clicks" Daily Activity  Outcome Measure Difficulty turning over in bed (including adjusting bedclothes, sheets and blankets)?: None Difficulty moving from lying on back to sitting on the side of the bed? : None Difficulty sitting down on and standing up from a chair with arms (e.g., wheelchair, bedside commode, etc,.)?: A Little Help needed moving to and from a bed to chair (including a wheelchair)?: A Little Help needed walking in hospital room?: A Little Help needed climbing 3-5 steps with a railing? : A Little 6 Click Score: 20    End of Session Equipment Utilized During Treatment: Gait belt Activity Tolerance: Patient tolerated treatment well Patient left: in bed   PT Visit Diagnosis: Muscle weakness (generalized) (M62.81);Difficulty in walking, not elsewhere classified (R26.2);Pain Pain -  Right/Left: Right Pain - part of body: Knee    Time: 4166-0630 PT Time Calculation (min) (ACUTE ONLY): 35 min   Charges:          Wells Guiles B. Paisyn Guercio, PT, DPT [phone]   PT Evaluation $PT Eval Moderate Complexity: 1 Mod PT Treatments $Gait Training: 8-22 mins   07/29/2017, 10:43 AM

## 2017-07-30 ENCOUNTER — Encounter (HOSPITAL_COMMUNITY): Payer: Self-pay | Admitting: Orthopaedic Surgery

## 2017-07-30 ENCOUNTER — Other Ambulatory Visit: Payer: Self-pay | Admitting: Internal Medicine

## 2017-07-30 MED FILL — Vancomycin HCl For IV Soln 1 GM (Base Equivalent): INTRAVENOUS | Qty: 1000 | Status: AC

## 2017-07-30 MED FILL — traMADol HCL 50 MG TABS: 50 | 30 days supply | Qty: 90 | Fill #1

## 2017-07-30 MED FILL — ELIQUIS 5 MG TABLET: 5 | 30 days supply | Qty: 60 | Fill #0

## 2017-07-30 MED FILL — PROMETHAZINE 25 MG TABLET: 25 | 7 days supply | Qty: 30 | Fill #0

## 2017-07-30 MED FILL — METHOCARBAMOL 750 MG TABS: 750 | 30 days supply | Qty: 60 | Fill #0

## 2017-07-30 MED FILL — OxyCONTIN 10 MG T12A: 10 | 3 days supply | Qty: 6 | Fill #0

## 2017-07-30 MED FILL — OXYCODONE-ACETAMINOPHEN 5-3: 5-325 | 3 days supply | Qty: 30 | Fill #0

## 2017-07-30 MED FILL — ONDANSETRON HCL 4 MG TABS: 4 | 6 days supply | Qty: 40 | Fill #0

## 2017-07-30 MED FILL — ATORVASTATIN 40 MG TABLET: 40 | 30 days supply | Qty: 30 | Fill #0

## 2017-07-30 NOTE — Progress Notes (Signed)
Physical Therapy Treatment Patient Details Name: Jeffrey [name] MRN: [id number] DOB: [birth year] Today's Date: 07/30/2017    History of Present Illness 52 y.o. male admitted on 07/28/17 for elective TKA.  Pt with significant PMH of L TKA 12/2016, DVT/PE on eliquis.      PT Comments    Pt received in bed excited to participate in PT.  He was Mod I today for all bed mobility and transfers.  Pt verbalized and demonstrated proper technique for stairs without VC's.  Reviewed HEP and knee precautions with pt.  Pt is due to d/c this AM per MD note.  He will benefit from continuing POC to regain functional independence.     Follow Up Recommendations  Follow surgeon's recommendation for DC plan and follow-up therapies;Supervision for mobility/OOB     Equipment Recommendations  None recommended by PT    Recommendations for Other Services       Precautions / Restrictions Precautions Precautions: Knee Precaution Booklet Issued: Yes (comment) Precaution Comments: knee exercise handout reviewed. Restrictions Weight Bearing Restrictions: Yes RLE Weight Bearing: Weight bearing as tolerated    Mobility  Bed Mobility Overal bed mobility: Modified Independent                Transfers Overall transfer level: Modified independent   Transfers: Sit to/from Stand Sit to Stand: Modified independent (Device/Increase time)         General transfer comment: Pt has high bed at home.    Ambulation/Gait Ambulation/Gait assistance: Supervision Ambulation Distance (Feet): 600 Feet Assistive device: Rolling walker (2 wheeled) Gait Pattern/deviations: Step-through pattern;Antalgic     General Gait Details: good heel to toe pattern, supervision for safety.   Stairs   Stairs assistance: Min guard Stair Management: One rail Left Number of Stairs: 2 General stair comments: Pt was able to verbalize correct LE sequence and demonstrate.  Min Guard for safety.   Wheelchair Mobility     Modified Rankin (Stroke Patients Only)       Balance Overall balance assessment: Needs assistance Sitting-balance support: Feet supported;No upper extremity supported       Standing balance support: No upper extremity supported;Bilateral upper extremity supported                                Cognition Arousal/Alertness: Awake/alert Behavior During Therapy: WFL for tasks assessed/performed Overall Cognitive Status: Within Functional Limits for tasks assessed                                        Exercises Total Joint Exercises Ankle Circles/Pumps: AROM;Both;20 reps;Seated Quad Sets: AROM;10 reps;Right;Seated Short Arc Quad: AROM;Right;10 reps;Seated Heel Slides: AROM;10 reps;Right;Seated Hip ABduction/ADduction: AROM;Right;10 reps Long Arc Quad: AROM;Right;10 reps;Seated Knee Flexion: AROM;Right;10 reps Goniometric ROM: 3-102 R knee Flex    General Comments        Pertinent Vitals/Pain Pain Assessment: 0-10 Pain Score: 5  Pain Location: right Pain Descriptors / Indicators: Aching Pain Intervention(s): Limited activity within patient's tolerance;Repositioned;Monitored during session;Ice applied    Home Living                      Prior Function            PT Goals (current goals can now be found in the care plan section) Acute Rehab PT Goals Patient Stated Goal: to  get this knee done so he can keep up with his 26 y.o. son PT Goal Formulation: With patient Time For Goal Achievement: 08/05/17 Potential to Achieve Goals: Good Progress towards PT goals: Progressing toward goals    Frequency    7X/week      PT Plan Current plan remains appropriate    Co-evaluation              AM-PAC PT "6 Clicks" Daily Activity  Outcome Measure  Difficulty turning over in bed (including adjusting bedclothes, sheets and blankets)?: None Difficulty moving from lying on back to sitting on the side of the bed? :  None Difficulty sitting down on and standing up from a chair with arms (e.g., wheelchair, bedside commode, etc,.)?: A Little Help needed moving to and from a bed to chair (including a wheelchair)?: A Little Help needed walking in hospital room?: A Little Help needed climbing 3-5 steps with a railing? : A Little 6 Click Score: 20    End of Session Equipment Utilized During Treatment: Gait belt Activity Tolerance: Patient tolerated treatment well Patient left: in bed;with call bell/phone within reach   PT Visit Diagnosis: Muscle weakness (generalized) (M62.81);Difficulty in walking, not elsewhere classified (R26.2);Pain Pain - Right/Left: Right Pain - part of body: Knee     Time: 0912-0943 PT Time Calculation (min) (ACUTE ONLY): 31 min  Charges:  $Gait Training: 8-22 mins $Therapeutic Exercise: 8-22 mins                    G Codes:       Terri Skains, SPTA [phone]    Terri Skains 07/30/2017, 11:46 AM

## 2017-07-30 NOTE — Progress Notes (Signed)
Subjective: 2 Days Post-Op Procedure(s) (LRB): RIGHT TOTAL KNEE ARTHROPLASTY (Right) Patient reports pain as mild. Doing very well this am.  Objective: Vital signs in last 24 hours: Temp:  [97.7 F (36.5 C)-98.2 F (36.8 C)] 97.7 F (36.5 C) (05/03 0454) Pulse Rate:  [73-86] 73 (05/03 0454) Resp:  [16-20] 20 (05/03 0454) BP: (146-154)/(88-101) 150/101 (05/03 0454) SpO2:  [98 %-99 %] 99 % (05/03 0454)  Intake/Output from previous day: 05/02 0701 - 05/03 0700 In: 1260 [P.O.:1260] Out: 4600 [Urine:4600] Intake/Output this shift: No intake/output data recorded.  Recent Labs    07/29/17 0344  HGB 13.0   Recent Labs    07/29/17 0344  WBC 6.3  RBC 4.47  HCT 39.6  PLT 156   Recent Labs    07/29/17 0344  NA 130*  K 3.4*  CL 96*  CO2 25  BUN <5*  CREATININE 0.84  GLUCOSE 163*  CALCIUM 8.0*   Recent Labs    07/28/17 1131  INR 0.99    Neurologically intact Neurovascular intact Sensation intact distally Intact pulses distally Dorsiflexion/Plantar flexion intact Incision: dressing C/D/I No cellulitis present Compartment soft  Anticipated LOS equal to or greater than 2 midnights due to - Age 65 and older with one or more of the following:  - Obesity  - Expected need for hospital services (PT, OT, Nursing) required for safe  discharge  - Anticipated need for postoperative skilled nursing care or inpatient rehab  - Active co-morbidities: None OR   - Unanticipated findings during/Post Surgery: Slow post-op progression: GI, pain control, mobility  - Patient is a high risk of re-admission due to: None   Assessment/Plan: 2 Days Post-Op Procedure(s) (LRB): RIGHT TOTAL KNEE ARTHROPLASTY (Right) Advance diet Up with therapy Discharge home with home health after first session of PT WBAT RLE PLEASE APPLY TED HOSE TO RLE PRIOR TO D/C    Aundra Dubin 07/30/2017, 7:47 AM

## 2017-07-30 NOTE — Progress Notes (Signed)
Pt discharged home with wife. AVS and scripts given to and reviewed in full with patient, all questions answered. All belongings sent with patient. VSS. Blood pressure (!) 150/96, pulse 78, temperature 97.9 F (36.6 C), temperature source Oral, resp. rate 18, height 6\' 4"  (1.93 m), weight 119.7 kg (264 lb), SpO2 99 %.

## 2017-07-30 NOTE — Care Management Note (Signed)
Case Management Note  Patient Details  Name: Jeffrey [name] MRN: [id number] Date of Birth: Aug 01, 1965  Subjective/Objective:  52 yr old gentleman s/p right total knee arthroplasty.  Action/Plan:Case manager spoke with patient concerning discharge plan and DME. Patient was preoperatively setup with [facility], no changes. He will have family support at discharge.             Expected Discharge Date:  07/30/17               Expected Discharge Plan:  [place]  In-House Referral:  NA  Discharge planning Services  NA  Post Acute Care Choice:  Home Health Choice offered to:  Patient  DME Arranged:  N/A(Has RW and 3in1 from previous surgery ) DME Agency:  NA  HH Arranged:  PT Green Agency:  [facility] (formerly [employer])  Status of Service:  Completed, signed off  If discussed at [employer], dates discussed:    Additional Comments:  Ninfa Meeker, RN 07/30/2017, 8:53 AM

## 2017-07-30 NOTE — Discharge Summary (Signed)
[name] MRN: [id number] DOB/AGE: 10-26-1965 52 y.o.  Admit date: 07/28/2017 Discharge date: 07/30/2017  Admission Diagnoses:  Active Problems:   Total knee replacement status   Discharge Diagnoses:  Same  Past Medical History:  Diagnosis Date  . Acute pulmonary embolus (HCC)    bilateral submassive PE in setting of missing several doses of Xarelto for his DVT  . Arthritis   . DVT (deep venous thrombosis) ([place]) 11/28/2013   RT LEG  . High cholesterol   . Hypertension   . Marijuana use     Surgeries: Procedure(s): RIGHT TOTAL KNEE ARTHROPLASTY on 07/28/2017   Consultants:   Discharged Condition: Improved  Hospital Course: Jeffrey [name] is an 52 y.o. male who was admitted 07/28/2017 for operative treatment of primary localized osteoarthritis right knee. Patient has severe unremitting pain that affects sleep, daily activities, and work/hobbies. After pre-op clearance the patient was taken to the operating room on 07/28/2017 and underwent  Procedure(s): RIGHT TOTAL KNEE ARTHROPLASTY.    Patient was given perioperative antibiotics:  Anti-infectives (From admission, onward)   Start     Dose/Rate Route Frequency Ordered Stop   07/28/17 1930  ceFAZolin (ANCEF) IVPB 2g/100 mL premix     2 g 200 mL/hr over 30 Minutes Intravenous Every 6 hours 07/28/17 1829 07/29/17 1447   07/28/17 1130  ceFAZolin (ANCEF) 3 g in dextrose 5 % 50 mL IVPB     3 g 130 mL/hr over 30 Minutes Intravenous To [facility] 07/27/17 1056 07/28/17 1320       Patient was given sequential compression devices, early ambulation, and chemoprophylaxis to prevent DVT.  Patient benefited maximally from hospital stay and there were no complications.    Recent vital signs:  Patient Vitals for the past 24 hrs:  BP Temp Temp src Pulse Resp SpO2  07/30/17 0454 (!) 150/101 97.7 F (36.5 C) Oral 73 20 99 %  07/29/17 2035 (!) 146/100 98.2 F (36.8 C) Oral 73 16 98 %  07/29/17 1342 (!) 147/98  98.2 F (36.8 C) Oral 86 16 98 %  07/29/17 0951 (!) 154/88 - - - - -  07/29/17 0947 (!) 154/88 - - - - -     Recent laboratory studies:  Recent Labs    07/28/17 1131 07/29/17 0344  WBC  --  6.3  HGB  --  13.0  HCT  --  39.6  PLT  --  156  NA  --  130*  K  --  3.4*  CL  --  96*  CO2  --  25  BUN  --  <5*  CREATININE  --  0.84  GLUCOSE  --  163*  INR 0.99  --   CALCIUM  --  8.0*     Discharge Medications:   Allergies as of 07/30/2017   No Known Allergies     Medication List    STOP taking these medications   traMADol 50 MG tablet Commonly known as:  ULTRAM     TAKE these medications   albuterol 108 (90 Base) MCG/ACT inhaler Commonly known as:  PROVENTIL HFA;VENTOLIN HFA Inhale 2 puffs into the lungs every 6 (six) hours as needed for wheezing or shortness of breath.   amLODipine 10 MG tablet Commonly known as:  NORVASC Take 1 tablet (10 mg total) by mouth daily.   apixaban 5 MG Tabs tablet Commonly known as:  ELIQUIS Take 1 tablet (5 mg total) by mouth 2 (two) times daily.  atorvastatin 40 MG tablet Commonly known as:  LIPITOR TAKE 1 TABLET BY MOUTH DAILY.   enoxaparin 120 MG/0.8ML injection Commonly known as:  LOVENOX Inject 0.8 mLs (120 mg total) into the skin every 12 (twelve) hours.   lisinopril 10 MG tablet Commonly known as:  PRINIVIL,ZESTRIL Take 1 tablet (10 mg total) by mouth daily.   methocarbamol 750 MG tablet Commonly known as:  ROBAXIN Take 1 tablet (750 mg total) by mouth 2 (two) times daily as needed for muscle spasms.   mupirocin ointment 2 % Commonly known as:  BACTROBAN Apply 1 application topically 2 (two) times daily.   ondansetron 4 MG tablet Commonly known as:  ZOFRAN Take 1-2 tablets (4-8 mg total) every 8 (eight) hours as needed by mouth for nausea or vomiting. What changed:  Another medication with the same name was added. Make sure you understand how and when to take each.   ondansetron 4 MG tablet Commonly known as:   ZOFRAN Take 1-2 tablets (4-8 mg total) by mouth every 8 (eight) hours as needed for nausea or vomiting. What changed:  You were already taking a medication with the same name, and this prescription was added. Make sure you understand how and when to take each.   oxyCODONE 10 mg 12 hr tablet Commonly known as:  OXYCONTIN Take 1 tablet (10 mg total) every 12 (twelve) hours by mouth. What changed:  Another medication with the same name was added. Make sure you understand how and when to take each.   oxyCODONE 10 mg 12 hr tablet Commonly known as:  OXYCONTIN Take 1 tablet (10 mg total) by mouth every 12 (twelve) hours for 3 days. What changed:  You were already taking a medication with the same name, and this prescription was added. Make sure you understand how and when to take each.   oxyCODONE-acetaminophen 5-325 MG tablet Commonly known as:  PERCOCET Take 1-2 tablets every 8 (eight) hours as needed by mouth for severe pain. What changed:  Another medication with the same name was added. Make sure you understand how and when to take each.   oxyCODONE-acetaminophen 5-325 MG tablet Commonly known as:  PERCOCET Take 1-2 tablets by mouth every 4 (four) hours as needed for severe pain. What changed:  You were already taking a medication with the same name, and this prescription was added. Make sure you understand how and when to take each.   promethazine 25 MG tablet Commonly known as:  PHENERGAN Take 1 tablet (25 mg total) every 6 (six) hours as needed by mouth for nausea. What changed:  Another medication with the same name was added. Make sure you understand how and when to take each.   promethazine 25 MG tablet Commonly known as:  PHENERGAN Take 1 tablet (25 mg total) by mouth every 6 (six) hours as needed for nausea. What changed:  You were already taking a medication with the same name, and this prescription was added. Make sure you understand how and when to take each.    senna-docusate 8.6-50 MG tablet Commonly known as:  SENOKOT S Take 1 tablet at bedtime as needed by mouth. What changed:  Another medication with the same name was added. Make sure you understand how and when to take each.   senna-docusate 8.6-50 MG tablet Commonly known as:  SENOKOT S Take 1 tablet by mouth at bedtime as needed. What changed:  You were already taking a medication with the same name, and this prescription was added.  Make sure you understand how and when to take each.   tiZANidine 4 MG tablet Commonly known as:  ZANAFLEX Take 1 tablet (4 mg total) by mouth every 6 (six) hours as needed for muscle spasms.            Durable Medical Equipment  (From admission, onward)        Start     Ordered   07/28/17 1830  DME Walker rolling  Once    Question:  Patient needs a walker to treat with the following condition  Answer:  Total knee replacement status   07/28/17 1829   07/28/17 1830  DME 3 n 1  Once     07/28/17 1829   07/28/17 1830  DME Bedside commode  Once    Question:  Patient needs a bedside commode to treat with the following condition  Answer:  Total knee replacement status   07/28/17 1829      Diagnostic Studies: Dg Chest 2 View  Result Date: 07/19/2017 CLINICAL DATA:  Preop for total knee arthroplasty. EXAM: CHEST - 2 VIEW COMPARISON:  Radiographs of June 03, 2016. FINDINGS: The heart size and mediastinal contours are within normal limits. No pneumothorax is noted. Left lung is clear. Minimal right pleural effusion is noted. The visualized skeletal structures are unremarkable. IMPRESSION: Minimal right pleural effusion. Electronically Signed   By: Marijo Conception, M.D.   On: 07/19/2017 16:25   Dg Knee Right Port  Result Date: 07/28/2017 CLINICAL DATA:  Total knee arthroplasty EXAM: PORTABLE RIGHT KNEE - 1-2 VIEW COMPARISON:  Preop study from 05/17/2013 FINDINGS: New right total knee arthroplasty with patellar resurfacing button in place. Postoperative  subcutaneous and intra-articular emphysema is noted. Small cluster of ossific densities posteriorly within the popliteal fossa may reflect loose bodies within a popliteal cyst. IMPRESSION: No hardware failure nor immediate postoperative complications status post right total knee arthroplasty. Numerous ossific densities in the popliteal fossa are noted that likely reflect loose bodies potentially within a popliteal cyst. Electronically Signed   By: Ashley Royalty M.D.   On: 07/28/2017 17:56    Disposition: Discharge disposition: 01-Home or Self Care         Follow-up Information    Leandrew Koyanagi, MD In 2 weeks.   Specialty:  Orthopedic Surgery Why:  For suture removal, For wound re-check Contact information: [place] [phone]            Signed: Aundra Dubin 07/30/2017, 7:48 AM

## 2017-07-31 DIAGNOSIS — F1290 Cannabis use, unspecified, uncomplicated: Secondary | ICD-10-CM | POA: Diagnosis not present

## 2017-07-31 DIAGNOSIS — Z471 Aftercare following joint replacement surgery: Secondary | ICD-10-CM | POA: Diagnosis not present

## 2017-07-31 DIAGNOSIS — F17210 Nicotine dependence, cigarettes, uncomplicated: Secondary | ICD-10-CM | POA: Diagnosis not present

## 2017-07-31 DIAGNOSIS — I10 Essential (primary) hypertension: Secondary | ICD-10-CM | POA: Diagnosis not present

## 2017-07-31 DIAGNOSIS — Z7901 Long term (current) use of anticoagulants: Secondary | ICD-10-CM | POA: Diagnosis not present

## 2017-07-31 DIAGNOSIS — Z86718 Personal history of other venous thrombosis and embolism: Secondary | ICD-10-CM | POA: Diagnosis not present

## 2017-07-31 DIAGNOSIS — Z96651 Presence of right artificial knee joint: Secondary | ICD-10-CM | POA: Diagnosis not present

## 2017-07-31 DIAGNOSIS — I2510 Atherosclerotic heart disease of native coronary artery without angina pectoris: Secondary | ICD-10-CM | POA: Diagnosis not present

## 2017-07-31 DIAGNOSIS — Z86711 Personal history of pulmonary embolism: Secondary | ICD-10-CM | POA: Diagnosis not present

## 2017-08-02 ENCOUNTER — Telehealth (INDEPENDENT_AMBULATORY_CARE_PROVIDER_SITE_OTHER): Payer: Self-pay | Admitting: *Deleted

## 2017-08-02 ENCOUNTER — Other Ambulatory Visit: Payer: Self-pay

## 2017-08-02 ENCOUNTER — Telehealth (INDEPENDENT_AMBULATORY_CARE_PROVIDER_SITE_OTHER): Payer: Self-pay | Admitting: Orthopaedic Surgery

## 2017-08-02 DIAGNOSIS — R319 Hematuria, unspecified: Secondary | ICD-10-CM

## 2017-08-02 NOTE — Telephone Encounter (Signed)
I'm not sure what he's referring to.  I'm unaware of this.  Please just have him come back in so we can take a look or if he can just give [place] clarification on this.

## 2017-08-02 NOTE — Telephone Encounter (Signed)
I gave orders to [facility]

## 2017-08-02 NOTE — Telephone Encounter (Signed)
IC both numbers, first is wrong number, and NA at wife's number and cannot leave VM.   What is he referring to as far as bleeding from the groin?

## 2017-08-02 NOTE — Telephone Encounter (Signed)
Pt called stating he was released from hospital from knee sx and while he was in hospital he was given antibiotic from bleeding in the groin(?) and it has stopped and now that he is home he is bleeding again and would like to know if Dr. Erlinda Hong would prescribe him an anitibiotic while at home. He would like a call back today with answer so that his wife can pick up his RX. Please call [phone]

## 2017-08-02 NOTE — Telephone Encounter (Signed)
[place] with [facility] called needing verbal orders for 1 wk 1, 3 Wk 1 and 2 wk 1  The number to contact Verdis Frederickson is [phone]

## 2017-08-02 NOTE — Patient Outreach (Signed)
[place] [facility]) Care Management  08/02/2017  Jeffrey [name] 08/02/1965 [id number]   Telephone call for transition of care call.  Member was hospitalized for rt total knee replacement on 51/19 and discharged on 07/30/17. Member has hx ofLeft total knee arthroplastyon 02/10/17. Patient hospitalized 06/03/16 - 06/05/16 for bilateral pulmonary embolism. Patient also has a history of hypertension and tobacco use.  No answer and unable to leave message. Plan to send unsuccessful letter  Plan second attempt call in 3-4 business days.  Peter Garter RN, [facility] Care Management Coordinator [facility] Care Management [phone]

## 2017-08-03 MED ORDER — CIPROFLOXACIN HCL 500 MG PO TABS: 500.0000 mg | ORAL_TABLET | Freq: Two times a day (BID) | ORAL | 0 refills | Status: DC

## 2017-08-03 MED FILL — CIPROFLOXACIN HCL 500 MG TA: 500 | 7 days supply | Qty: 14 | Fill #0

## 2017-08-03 NOTE — Telephone Encounter (Signed)
He cipro 500 mg bid x 7 days.  He also needs to see urologist

## 2017-08-03 NOTE — Telephone Encounter (Signed)
IC patient and clarified.  He is having bleeding when he urinates. He says something went wrong when they catheterized him, and he had bleeding and they gave him an antibiotic, which helped.  He is asking for another abx, I advised him I would ask you about this, and see what we could do or if he needed referral to a urologist?  Pls advise, thanks.

## 2017-08-03 NOTE — Telephone Encounter (Signed)
There is an urgent referral to [facility], will you please get him scheduled ASAP?  Thanks!

## 2017-08-03 NOTE — Telephone Encounter (Signed)
Order faxed to [facility] and also sent thru [facility].

## 2017-08-04 DIAGNOSIS — Z471 Aftercare following joint replacement surgery: Secondary | ICD-10-CM | POA: Diagnosis not present

## 2017-08-04 DIAGNOSIS — F1290 Cannabis use, unspecified, uncomplicated: Secondary | ICD-10-CM | POA: Diagnosis not present

## 2017-08-04 DIAGNOSIS — F17210 Nicotine dependence, cigarettes, uncomplicated: Secondary | ICD-10-CM | POA: Diagnosis not present

## 2017-08-04 DIAGNOSIS — I10 Essential (primary) hypertension: Secondary | ICD-10-CM | POA: Diagnosis not present

## 2017-08-04 DIAGNOSIS — Z86718 Personal history of other venous thrombosis and embolism: Secondary | ICD-10-CM | POA: Diagnosis not present

## 2017-08-04 DIAGNOSIS — I2510 Atherosclerotic heart disease of native coronary artery without angina pectoris: Secondary | ICD-10-CM | POA: Diagnosis not present

## 2017-08-04 DIAGNOSIS — Z7901 Long term (current) use of anticoagulants: Secondary | ICD-10-CM | POA: Diagnosis not present

## 2017-08-04 DIAGNOSIS — Z96651 Presence of right artificial knee joint: Secondary | ICD-10-CM | POA: Diagnosis not present

## 2017-08-04 DIAGNOSIS — Z86711 Personal history of pulmonary embolism: Secondary | ICD-10-CM | POA: Diagnosis not present

## 2017-08-05 ENCOUNTER — Other Ambulatory Visit: Payer: Self-pay

## 2017-08-05 NOTE — Patient Outreach (Signed)
[facility]) Care Management  08/05/2017  KAMON FAHR 06/14/1965 [id number]    Telephone call for transition of care call 2nd attempt.  Member was hospitalized for rt total knee replacement on 51/19 and discharged on 07/30/17. Member has hx ofLeft total knee arthroplastyon 02/10/17. Patient hospitalized 06/03/16 - 06/05/16 for bilateral pulmonary embolism. Patient also has a history of hypertension and tobacco use.  No answer and HIPAA compliant message left. Plan for 3rd outreach call in 3-4 business days and will close case after 10 business days if no response to call or outreach letter. Peter Garter RN, [facility] Care Management Coordinator [facility] Care Management [phone]

## 2017-08-06 DIAGNOSIS — Z96651 Presence of right artificial knee joint: Secondary | ICD-10-CM | POA: Diagnosis not present

## 2017-08-06 DIAGNOSIS — Z86718 Personal history of other venous thrombosis and embolism: Secondary | ICD-10-CM | POA: Diagnosis not present

## 2017-08-06 DIAGNOSIS — Z86711 Personal history of pulmonary embolism: Secondary | ICD-10-CM | POA: Diagnosis not present

## 2017-08-06 DIAGNOSIS — Z471 Aftercare following joint replacement surgery: Secondary | ICD-10-CM | POA: Diagnosis not present

## 2017-08-06 DIAGNOSIS — F1290 Cannabis use, unspecified, uncomplicated: Secondary | ICD-10-CM | POA: Diagnosis not present

## 2017-08-06 DIAGNOSIS — Z7901 Long term (current) use of anticoagulants: Secondary | ICD-10-CM | POA: Diagnosis not present

## 2017-08-06 DIAGNOSIS — F17210 Nicotine dependence, cigarettes, uncomplicated: Secondary | ICD-10-CM | POA: Diagnosis not present

## 2017-08-06 DIAGNOSIS — I2510 Atherosclerotic heart disease of native coronary artery without angina pectoris: Secondary | ICD-10-CM | POA: Diagnosis not present

## 2017-08-06 DIAGNOSIS — I10 Essential (primary) hypertension: Secondary | ICD-10-CM | POA: Diagnosis not present

## 2017-08-09 ENCOUNTER — Other Ambulatory Visit: Payer: Self-pay | Admitting: *Deleted

## 2017-08-09 NOTE — Patient Outreach (Signed)
[place] [facility]) Care Management  08/09/2017  Jeffrey [name] Dec 14, 1965 [id number]   Subjective: Telephone call to patient's home number, no answer, left HIPAA compliant voicemail message, and requested call back.    Objective:Per KPN (Knowledge Performance Now, point of care tool) and chart review,patient hospitalized 07/28/17 -07/30/17 for Right knee osteoarthritis, and status post Right total knee arthroplasty on 07/28/17.   Patient hospitalized 02/10/17 -02/12/17 forLeft knee osteoarthritis. Status post Left total knee arthroplastyon 02/10/17. Patient hospitalized 06/03/16 - 06/05/16 for bilateral pulmonary embolism. Patient also has a history of hypertension and tobacco use. [facility] Care Management transition of care follow up completed on 02/23/17.      Assessment: Received [facility] Transition of care referral on 07/19/17. Transition of care follow up pending patient contact.      Plan:RNCM has sent unsuccessful outreach  letter, [facility] pamphlet, and will proceed with case closure, within 10 business days if no return call.      Cydney Alvarenga H. Annia Friendly, BSN, [place] Management [facility] Telephonic CM Phone: [phone] Fax: [phone]

## 2017-08-12 ENCOUNTER — Ambulatory Visit (INDEPENDENT_AMBULATORY_CARE_PROVIDER_SITE_OTHER): Payer: 59 | Admitting: Orthopaedic Surgery

## 2017-08-12 ENCOUNTER — Other Ambulatory Visit: Payer: 59 | Admitting: *Deleted

## 2017-08-12 ENCOUNTER — Encounter (INDEPENDENT_AMBULATORY_CARE_PROVIDER_SITE_OTHER): Payer: Self-pay | Admitting: Orthopaedic Surgery

## 2017-08-12 ENCOUNTER — Ambulatory Visit (INDEPENDENT_AMBULATORY_CARE_PROVIDER_SITE_OTHER): Payer: 59

## 2017-08-12 VITALS — BP 150/100 | HR 111 | Ht 75.0 in | Wt 263.4 lb

## 2017-08-12 DIAGNOSIS — I10 Essential (primary) hypertension: Secondary | ICD-10-CM

## 2017-08-12 DIAGNOSIS — I2510 Atherosclerotic heart disease of native coronary artery without angina pectoris: Secondary | ICD-10-CM

## 2017-08-12 DIAGNOSIS — Z96652 Presence of left artificial knee joint: Secondary | ICD-10-CM

## 2017-08-12 LAB — BASIC METABOLIC PANEL
BUN / CREAT RATIO: 9 (ref 9–20)
BUN: 8 mg/dL (ref 6–24)
CO2: 22 mmol/L (ref 20–29)
CREATININE: 0.86 mg/dL (ref 0.76–1.27)
Calcium: 9.3 mg/dL (ref 8.7–10.2)
Chloride: 97 mmol/L (ref 96–106)
GFR calc non Af Amer: 99 mL/min/{1.73_m2} (ref >59)
GFR, EST AFRICAN AMERICAN: 114 mL/min/{1.73_m2} (ref >59)
Glucose: 119 mg/dL — ABNORMAL HIGH (ref 65–99)
Potassium: 4.5 mmol/L (ref 3.5–5.2)
SODIUM: 133 mmol/L — AB (ref 134–144)

## 2017-08-12 MED ORDER — OXYCODONE-ACETAMINOPHEN 5-325 MG PO TABS: 1.0000 | ORAL_TABLET | Freq: Three times a day (TID) | ORAL | 0 refills | Status: DC | PRN

## 2017-08-12 MED FILL — OXYCODONE-ACETAMINOPHEN 5-3: 5-325 | 4 days supply | Qty: 42 | Fill #0

## 2017-08-12 NOTE — Progress Notes (Signed)
Post-Op Visit Note   Patient: Jeffrey [name]           Date of Birth: Dec 30, 1965           MRN: [id number] Visit Date: 08/12/2017 PCP: Tonette Bihari, MD   Assessment & Plan:  Chief Complaint:  Chief Complaint  Patient presents with  . Right Knee - Pain, Routine Post Op, Follow-up   Visit Diagnoses:  1. S/P TKR (total knee replacement), left     Plan: Patient is a pleasant 52 year old gentleman who presents to our clinic today 15 days status post right total knee replacement, date of surgery 07/28/2017.  He has been doing well.  Moderate pain but this is being relieved with Percocet.  No fevers, chills or any other systemic symptoms.  He has been working with home health physical therapy making fair progress.  He is been ambulating with a walker.  He has been using his TED hose and elevating for swelling.  Examination of the right lower extremity reveals a well-healing surgical incision without evidence of infection.  Calf is soft and nontender.  He does have some posterior ecchymosis.  At this point, we will transition the patient to outpatient physical therapy.  A prescription was given today.  I also refilled his Percocet.  He will follow-up with [place] in 4 weeks time for repeat evaluation.  Follow-Up Instructions: Return in about 1 month (around 09/09/2017).   Orders:  Orders Placed This Encounter  Procedures  . Ambulatory referral to Physical Therapy   Meds ordered this encounter  Medications  . oxyCODONE-acetaminophen (PERCOCET/ROXICET) 5-325 MG tablet    Sig: Take 1-2 tablets by mouth every 8 (eight) hours as needed for severe pain.    Dispense:  42 tablet    Refill:  0    Imaging: No results found.  PMFS History: Patient Active Problem List   Diagnosis Date Noted  . Total knee replacement status 07/28/2017  . Preoperative clearance 07/20/2017  . History of pulmonary embolus (PE) 07/20/2017  . Mild CAD 07/20/2017  . Pain in left ankle and joints of left foot  06/21/2017  . S/P TKR (total knee replacement), left 02/10/2017  . Chronic anticoagulation 01/26/2017  . Acute pulmonary embolus ([place])   . Primary osteoarthritis of left knee 07/28/2016  . Chest pain   . Abnormal EKG   . Pulmonary embolism ([place]) 06/03/2016  . Unilateral primary osteoarthritis, right knee 08/08/2015  . Encounter for chronic pain management 04/06/2014  . Tobacco abuse 12/05/2013  . DVT (deep venous thrombosis) ([place]) 11/28/2013  . Candidate for statin therapy due to risk of future cardiovascular event 05/05/2013  . HTN (hypertension) 05/04/2013  . Bilateral knee pain 05/04/2013  . Lumbar back pain 05/04/2013  . Healthcare maintenance 05/04/2013  . BMI 33.0-33.9,adult 05/04/2013  . Hx of substance abuse 05/04/2013   Past Medical History:  Diagnosis Date  . Acute pulmonary embolus (HCC)    bilateral submassive PE in setting of missing several doses of Xarelto for his DVT  . Arthritis   . DVT (deep venous thrombosis) ([place]) 11/28/2013   RT LEG  . High cholesterol   . Hypertension   . Marijuana use     Family History  Problem Relation Age of Onset  . Heart disease Other   . Arthritis Other   . Alcohol abuse Mother   . Heart disease Mother   . Depression Mother   . Hypertension Mother   . Kidney disease Mother   .  Arthritis Mother   . Clotting disorder Mother   . Arthritis Father   . Alcohol abuse Brother   . Drug abuse Brother   . Sickle cell anemia Brother   . Arthritis Maternal Grandmother   . Heart disease Maternal Grandmother   . Arthritis Maternal Grandfather   . Heart disease Maternal Grandfather   . Asthma Cousin     Past Surgical History:  Procedure Laterality Date  . HERNIA REPAIR     0459 and 9774; umbilical hernia repair  . KNEE SURGERY Bilateral    Left knee 1993, right knee 2004  . TONSILLECTOMY    . TOTAL KNEE ARTHROPLASTY Left 02/10/2017   Procedure: LEFT TOTAL KNEE ARTHROPLASTY;  Surgeon: Leandrew Koyanagi, MD;  Location: [facility];   Service: Orthopedics;  Laterality: Left;  . TOTAL KNEE ARTHROPLASTY Right 07/28/2017   Procedure: RIGHT TOTAL KNEE ARTHROPLASTY;  Surgeon: Leandrew Koyanagi, MD;  Location: [facility];  Service: Orthopedics;  Laterality: Right;   Social History   Occupational History  . Not on file  Tobacco Use  . Smoking status: Current Every Day Smoker    Packs/day: 0.25    Years: 35.00    Pack years: 8.75    Types: Cigarettes    Start date: 10/07/1976  . Smokeless tobacco: Never Used  . Tobacco comment: Stopped for up to 5 years total - Peak rate 2ppd  Substance and Sexual Activity  . Alcohol use: Yes    Alcohol/week: 7.2 oz    Types: 12 Cans of beer per week    Comment: seldom wine/liquor  . Drug use: Yes    Types: Marijuana, Cocaine    Comment: Cocaine last used in 2004, current marijuana use  . Sexual activity: Yes    Birth control/protection: None    Comment: With wife

## 2017-08-12 NOTE — Patient Instructions (Signed)
Medication Instructions:  Your physician recommends that you continue on your current medications as directed. Please refer to the Current Medication list given to you today.  Labwork: NONE  Testing/Procedures: NONE  Follow-Up: Your physician recommends that you schedule a follow-up appointment in: 3 weeks with hypertension clinic.   If you need a refill on your cardiac medications before your next appointment, please call your pharmacy.

## 2017-08-12 NOTE — Progress Notes (Signed)
1.) Reason for visit: BP check  2.) Name of MD requesting visit: Ermalinda Barrios PA  3.) H&P: Patient had recent Right Total Knee Replacement by Dr. Erlinda Hong. Patient has history of DVT, PE, HTN, and mild CAD. Patient was started on lisinopril 10 mg by mouth daily at office visit on 07/21/17.   4.) ROS related to problem: Patient visit today is for a BP check since patient was started on lisinopril 10 mg on 07/21/17. Patient is tolerating medication. Patient's weight is 263 lbs, height 6'3", BP 150/100, HR 111, and 97% O2 on room air. Patient stated that with his recent surgery he is in a lot of pain. Right now patient states his pain is at a 6 on the pain scale. Patient stated he just took his pain medication. Patient stated he takes his BP at home has been good.   5.) Assessment and plan per MD: Consulted Dr. Irish Lack (DOD), he recommends patient come back in a few weeks to see HTN clinic, and to continue check BP at home. That his elevated BP and HR is possibly due to patient's pain. Will forward office visit note to Ermalinda Barrios PA.

## 2017-08-13 ENCOUNTER — Other Ambulatory Visit: Payer: Self-pay | Admitting: *Deleted

## 2017-08-13 NOTE — Patient Outreach (Signed)
[facility]) Care Management  08/13/2017  Jeffrey [name] 02/23/1966 [id number]   Not able to successfully contact patient despite hree attempts to complete transition of care assessment. Case closed to [place] Management services this 10th business day per case closure call attempt workflow.  Barrington Ellison RN,CCM,CDE [place] Management Coordinator Office Phone [phone] Office Fax [phone]

## 2017-08-16 ENCOUNTER — Telehealth: Payer: Self-pay | Admitting: Physical Therapy

## 2017-08-16 NOTE — Telephone Encounter (Signed)
Our office called to [facility] his appointment Tues at 1:30 due to scheduling error. He [facility] for Thursday. I called patient to see if he could come in earlier than Thursday. He said his ride was only available for Thursday appointment and was understanding of scheduling conflict Tuesday.  He reported he was doing his exercises at home in the meantime.

## 2017-08-17 ENCOUNTER — Ambulatory Visit: Payer: 59 | Admitting: Physical Therapy

## 2017-08-18 ENCOUNTER — Telehealth (INDEPENDENT_AMBULATORY_CARE_PROVIDER_SITE_OTHER): Payer: Self-pay | Admitting: Orthopaedic Surgery

## 2017-08-18 DIAGNOSIS — A5903 Trichomonal cystitis and urethritis: Secondary | ICD-10-CM | POA: Diagnosis not present

## 2017-08-18 DIAGNOSIS — R310 Gross hematuria: Secondary | ICD-10-CM | POA: Diagnosis not present

## 2017-08-18 DIAGNOSIS — N5201 Erectile dysfunction due to arterial insufficiency: Secondary | ICD-10-CM | POA: Diagnosis not present

## 2017-08-18 MED FILL — metroNIDAZOLE 500 MG TABS: 500 | 1 days supply | Qty: 4 | Fill #0

## 2017-08-18 NOTE — Telephone Encounter (Signed)
Patient called needing Rx refilled (Oxycodone) The number to contact patient is [phone]

## 2017-08-18 NOTE — Telephone Encounter (Signed)
#  30.  1-2 tab daily prn

## 2017-08-18 NOTE — Telephone Encounter (Signed)
Please advise 

## 2017-08-19 ENCOUNTER — Other Ambulatory Visit (INDEPENDENT_AMBULATORY_CARE_PROVIDER_SITE_OTHER): Payer: Self-pay

## 2017-08-19 ENCOUNTER — Ambulatory Visit: Payer: 59 | Attending: Physician Assistant | Admitting: Physical Therapy

## 2017-08-19 ENCOUNTER — Encounter: Payer: Self-pay | Admitting: Physical Therapy

## 2017-08-19 DIAGNOSIS — M6281 Muscle weakness (generalized): Secondary | ICD-10-CM | POA: Diagnosis not present

## 2017-08-19 DIAGNOSIS — R600 Localized edema: Secondary | ICD-10-CM | POA: Insufficient documentation

## 2017-08-19 DIAGNOSIS — M25661 Stiffness of right knee, not elsewhere classified: Secondary | ICD-10-CM | POA: Diagnosis not present

## 2017-08-19 DIAGNOSIS — Z96651 Presence of right artificial knee joint: Secondary | ICD-10-CM | POA: Insufficient documentation

## 2017-08-19 DIAGNOSIS — M25561 Pain in right knee: Secondary | ICD-10-CM | POA: Diagnosis not present

## 2017-08-19 MED ORDER — OXYCODONE-ACETAMINOPHEN 5-325 MG PO TABS: ORAL_TABLET | ORAL | 0 refills | Status: DC

## 2017-08-19 MED FILL — OXYCODONE-ACETAMINOPHEN 5-3: 5-325 | 15 days supply | Qty: 30 | Fill #0

## 2017-08-19 NOTE — Telephone Encounter (Signed)
Patient called to check on prescription, he wants to pick this up today if possible. I told him you would call when it is available at the front. Patients # [phone]

## 2017-08-19 NOTE — Telephone Encounter (Signed)
Called patient he is aware.

## 2017-08-20 NOTE — Therapy (Signed)
[place], [place], [address] Phone: [phone]   Fax:  [phone]  Physical Therapy Evaluation  Patient Details  Name: Jeffrey [name] MRN: [id number] Date of Birth: July 02, 1965 Referring Provider: Dr. Erlinda Hong   Encounter Date: 08/19/2017  PT End of Session - 08/19/17 1621    Visit Number  1    Number of Visits  24    Date for PT Re-Evaluation  10/14/17    PT Start Time  1550    PT Stop Time  1630    PT Time Calculation (min)  40 min    Activity Tolerance  Patient tolerated treatment well    Behavior During Therapy  [facility] for tasks assessed/performed       Past Medical History:  Diagnosis Date  . Acute pulmonary embolus (HCC)    bilateral submassive PE in setting of missing several doses of Xarelto for his DVT  . Arthritis   . DVT (deep venous thrombosis) ([place]) 11/28/2013   RT LEG  . High cholesterol   . Hypertension   . Marijuana use     Past Surgical History:  Procedure Laterality Date  . HERNIA REPAIR     2585 and 2778; umbilical hernia repair  . KNEE SURGERY Bilateral    Left knee 1993, right knee 2004  . TONSILLECTOMY    . TOTAL KNEE ARTHROPLASTY Left 02/10/2017   Procedure: LEFT TOTAL KNEE ARTHROPLASTY;  Surgeon: Leandrew Koyanagi, MD;  Location: [place];  Service: Orthopedics;  Laterality: Left;  . TOTAL KNEE ARTHROPLASTY Right 07/28/2017   Procedure: RIGHT TOTAL KNEE ARTHROPLASTY;  Surgeon: Leandrew Koyanagi, MD;  Location: [place] [place];  Service: Orthopedics;  Laterality: Right;    There were no vitals filed for this visit.   Subjective Assessment - 08/19/17 1555    Subjective  Pt familiar to this clinic presents for Rt total knee replacement without complications.  He had 2 weeks of HHPT.  He walks with a rolling walker most of the time.  He maintains he does his HEP.  He is limited in normal mobilty, gait pattern, transfers due to signficant knee stiffness, edema and weakness. He is motivated to begin  therapy and get back to normal activity.      Limitations  Sitting;Standing;Walking;Lifting;House hold activities;Other (comment)    How long can you stand comfortably?  with pain meds, 45 min     Patient Stated Goals  Be able to walk better, no walker    Currently in Pain?  Yes    Pain Score  6     Pain Location  Knee    Pain Orientation  Right    Pain Descriptors / Indicators  Aching;Throbbing    Pain Type  Surgical pain    Pain Radiating Towards  down leg     Pain Onset  More than a month ago    Pain Frequency  Intermittent    Aggravating Factors   bending it, standing , walking     Pain Relieving Factors  ice, meds         [facility] PT Assessment - 08/19/17 0001      Assessment   Medical Diagnosis  Rt TKA     Referring Provider  Dr. Erlinda Hong    Onset Date/Surgical Date  07/28/17    Next MD Visit  2 weeks     Prior Therapy  L knee and HHPT       Precautions   Precautions  None  Restrictions   Weight Bearing Restrictions  No      Balance Screen   Has the patient fallen in the past 6 months  No      [place] residence    Living Arrangements  Spouse/significant other;Children  (Pended)     Available Help at Discharge  Family  (Pended)     Type of [place]  (Pended)     Home Access  Stairs to enter  (Pended)     Entrance Stairs-Number of Steps  3  (Pended)     Entrance Stairs-Rails  Right  (Pended)     Home Layout  One level  (Pended)     Home Equipment  Walker - 2 wheels;Cane - single point  [employer])       Prior Function   Leisure  TV, scrabble  (Pended)       Cognition   Overall Cognitive Status  Within Functional Limits for tasks assessed      Observation/Other Assessments   Focus on Therapeutic Outcomes (FOTO)   70%  (Pended)       Circumferential Edema   Circumferential - Right  20 inch     Circumferential - Left   18.5 inch       Sensation   Additional Comments  numb lateral R knee       AROM   Right Knee  Extension  12    Right Knee Flexion  86 with strap    Left Knee Extension  0    Left Knee Flexion  99      Strength   Right Hip ABduction  4/5    Left Hip ABduction  4/5    Right Knee Flexion  4+/5    Right Knee Extension  4/5        There ex: NUstep L5 LE only 6 min Cold pack Rt knee 10 min         Objective measurements completed on examination: See above findings.              PT Education - 08/19/17 1610    Education provided  Yes    Education Details  PT/ ROM and RICE     Person(s) Educated  Patient    Methods  Explanation;Demonstration    Comprehension  Verbalized understanding;Returned demonstration       PT Short Term Goals - 08/19/17 1611      PT SHORT TERM GOAL #1   Title  Pt will be able to sit with knee flexion >/= 90 deg for normal tranfers and ROM .     Time  4    Period  Weeks    Status  New    Target Date  09/16/17      PT SHORT TERM GOAL #2   Title  Pt will be able to walk with cane and improved, more natural motion on R knee.     Time  4    Period  Weeks    Status  New    Target Date  09/16/17      PT SHORT TERM GOAL #3   Title  Pt wiil sustain 10 sec quad set for improved SLR, gait and stability    Time  4    Period  Weeks    Status  New    Target Date  09/16/17      PT SHORT TERM GOAL #4   Title  Pt will  be I with HEP and report consistent stretching for max results.     Time  4    Period  Weeks    Status  New    Target Date  09/16/17        PT Long Term Goals - 08/19/17 1613      PT LONG TERM GOAL #1   Title  Pt will score <50% on FOTO to demo improvement in overall functional mobility.     Time  8    Period  Weeks    Status  New    Target Date  10/14/17      PT LONG TERM GOAL #2   Title  Pt will demo strength in L knee 5/5 and hip at least 4+/5 in extension and abduction for normal gait.     Time  8    Period  Weeks    Status  New    Target Date  10/14/17      PT LONG TERM GOAL #3   Title  Pt will be  able to flex R knee to 110 deg for normal sitting, transfers     Time  8    Period  Weeks    Status  New    Target Date  10/14/17      PT LONG TERM GOAL #4   Title  Pt will be able to walk with min limp and have no increase in pain up to 45 min to 1 hour, cane if needed.     Time  8    Period  Weeks    Status  New    Target Date  10/14/17             Plan - 08/20/17 0747    Clinical Impression Statement  Patient returns for low complexity eval of Rt knee s/p total knee replacement.  He presents with decreased ROM, strength (knee, core, hips) , abnormal gait and decreased endurance.  He has had a normal post operative course and 2 weeks of HHPT.  He will work hard in PT however L knee continues to be stiff as well.      Clinical Presentation  Stable    Clinical Decision Making  Low    Rehab Potential  Good    PT Frequency  3x / week    PT Duration  8 weeks    PT Treatment/Interventions  ADLs/Self Care Home Management;Cryotherapy;Ultrasound;Gait training;Stair training;Balance training;Manual lymph drainage;Dry needling;Passive range of motion;Manual techniques;Patient/family education;Therapeutic exercise;DME Instruction;Electrical Stimulation;Functional mobility training;Therapeutic activities;Neuromuscular re-education;Taping;Vasopneumatic Device;Moist Heat    PT Next Visit Plan  ROM, gait, edema control     PT Home Exercise Plan  level 1-2 knee from HHPT     Consulted and Agree with Plan of Care  Patient       Patient will benefit from skilled therapeutic intervention in order to improve the following deficits and impairments:  Abnormal gait, Decreased endurance, Decreased mobility, Difficulty walking, Hypomobility, Impaired sensation, Cardiopulmonary status limiting activity, Decreased range of motion, Decreased scar mobility, Increased edema, Improper body mechanics, Obesity, Pain, Postural dysfunction, Impaired flexibility, Increased fascial restricitons, Decreased  strength  Visit Diagnosis: Localized edema  Muscle weakness (generalized)  Total knee replacement status, right  Stiffness of right knee, not elsewhere classified  Acute pain of right knee     Problem List Patient Active Problem List   Diagnosis Date Noted  . Total knee replacement status 07/28/2017  . Preoperative clearance 07/20/2017  . History of pulmonary  embolus (PE) 07/20/2017  . Mild CAD 07/20/2017  . Pain in left ankle and joints of left foot 06/21/2017  . S/P TKR (total knee replacement), left 02/10/2017  . Chronic anticoagulation 01/26/2017  . Acute pulmonary embolus ([place])   . Primary osteoarthritis of left knee 07/28/2016  . Chest pain   . Abnormal EKG   . Pulmonary embolism ([place]) 06/03/2016  . Unilateral primary osteoarthritis, right knee 08/08/2015  . Encounter for chronic pain management 04/06/2014  . Tobacco abuse 12/05/2013  . DVT (deep venous thrombosis) ([place]) 11/28/2013  . Candidate for statin therapy due to risk of future cardiovascular event 05/05/2013  . HTN (hypertension) 05/04/2013  . Bilateral knee pain 05/04/2013  . Lumbar back pain 05/04/2013  . Healthcare maintenance 05/04/2013  . BMI 33.0-33.9,adult 05/04/2013  . Hx of substance abuse 05/04/2013    PAA,JENNIFER 08/20/2017, 7:50 AM  [place], [place], [address] Phone: [phone]   Fax:  [phone]  Name: ANDRIAN SABALA MRN: [id number] Date of Birth: 07/23/1965   Raeford Razor, PT 08/20/17 7:51 AM Phone: [phone] Fax: [phone]

## 2017-08-24 ENCOUNTER — Ambulatory Visit: Payer: 59 | Admitting: Physical Therapy

## 2017-08-24 ENCOUNTER — Encounter: Payer: Self-pay | Admitting: Physical Therapy

## 2017-08-24 DIAGNOSIS — M25561 Pain in right knee: Secondary | ICD-10-CM | POA: Diagnosis not present

## 2017-08-24 DIAGNOSIS — M25661 Stiffness of right knee, not elsewhere classified: Secondary | ICD-10-CM

## 2017-08-24 DIAGNOSIS — R600 Localized edema: Secondary | ICD-10-CM | POA: Diagnosis not present

## 2017-08-24 DIAGNOSIS — M6281 Muscle weakness (generalized): Secondary | ICD-10-CM | POA: Diagnosis not present

## 2017-08-24 DIAGNOSIS — Z96651 Presence of right artificial knee joint: Secondary | ICD-10-CM | POA: Diagnosis not present

## 2017-08-24 NOTE — Therapy (Addendum)
[place], [place], [address] Phone: [phone]   Fax:  [phone]  Physical Therapy Treatment and Addended Discharge  Patient Details  Name: Jeffrey [name] MRN: [id number] Date of Birth: 27-Oct-1965 Referring Provider: Dr. Erlinda Hong   Encounter Date: 08/24/2017  PT End of Session - 08/24/17 1505    Visit Number  2    Number of Visits  24    Date for PT Re-Evaluation  10/14/17    PT Start Time  0300    PT Stop Time  0345    PT Time Calculation (min)  45 min       Past Medical History:  Diagnosis Date  . Acute pulmonary embolus (HCC)    bilateral submassive PE in setting of missing several doses of Xarelto for his DVT  . Arthritis   . DVT (deep venous thrombosis) ([place]) 11/28/2013   RT LEG  . High cholesterol   . Hypertension   . Marijuana use     Past Surgical History:  Procedure Laterality Date  . HERNIA REPAIR     4580 and 9983; umbilical hernia repair  . KNEE SURGERY Bilateral    Left knee 1993, right knee 2004  . TONSILLECTOMY    . TOTAL KNEE ARTHROPLASTY Left 02/10/2017   Procedure: LEFT TOTAL KNEE ARTHROPLASTY;  Surgeon: Leandrew Koyanagi, MD;  Location: [place] [facility];  Service: Orthopedics;  Laterality: Left;  . TOTAL KNEE ARTHROPLASTY Right 07/28/2017   Procedure: RIGHT TOTAL KNEE ARTHROPLASTY;  Surgeon: Leandrew Koyanagi, MD;  Location: [place];  Service: Orthopedics;  Laterality: Right;    There were no vitals filed for this visit.  Subjective Assessment - 08/24/17 1503    Subjective  6-7/10 in the mornings. 4/10 now.     Currently in Pain?  Yes    Pain Score  4     Pain Location  Knee    Pain Orientation  Right    Pain Descriptors / Indicators  Aching;Throbbing         [facility] PT Assessment - 08/24/17 0001      AROM   Right Knee Extension  15    Right Knee Flexion  94                   [facility] Adult PT Treatment/Exercise - 08/24/17 0001      Knee/Hip Exercises: Stretches   Active  Hamstring Stretch  10 seconds;5 reps strap       Knee/Hip Exercises: Aerobic   Nustep  L4 LE only x 5 minutes      Knee/Hip Exercises: Supine   Quad Sets  10 reps    Short Arc Quad Sets  10 reps;20 reps;Left;Right    Heel Slides  10 reps;AROM;AAROM;20 reps with and without strap     Straight Leg Raises  10 reps;Right    Other Supine Knee/Hip Exercises  Terminal knee extension supine, heel propped      Vasopneumatic   Number Minutes Vasopneumatic   15 minutes    Vasopnuematic Location   Knee    Vasopneumatic Pressure  Medium    Vasopneumatic Temperature   34      Manual Therapy   Manual therapy comments  patella creeping mobs, all planes                PT Short Term Goals - 08/19/17 1611      PT SHORT TERM GOAL #1   Title  Pt will be  able to sit with knee flexion >/= 90 deg for normal tranfers and ROM .     Time  4    Period  Weeks    Status  New    Target Date  09/16/17      PT SHORT TERM GOAL #2   Title  Pt will be able to walk with cane and improved, more natural motion on R knee.     Time  4    Period  Weeks    Status  New    Target Date  09/16/17      PT SHORT TERM GOAL #3   Title  Pt wiil sustain 10 sec quad set for improved SLR, gait and stability    Time  4    Period  Weeks    Status  New    Target Date  09/16/17      PT SHORT TERM GOAL #4   Title  Pt will be I with HEP and report consistent stretching for max results.     Time  4    Period  Weeks    Status  New    Target Date  09/16/17        PT Long Term Goals - 08/19/17 1613      PT LONG TERM GOAL #1   Title  Pt will score <50% on FOTO to demo improvement in overall functional mobility.     Time  8    Period  Weeks    Status  New    Target Date  10/14/17      PT LONG TERM GOAL #2   Title  Pt will demo strength in L knee 5/5 and hip at least 4+/5 in extension and abduction for normal gait.     Time  8    Period  Weeks    Status  New    Target Date  10/14/17      PT LONG TERM  GOAL #3   Title  Pt will be able to flex R knee to 110 deg for normal sitting, transfers     Time  8    Period  Weeks    Status  New    Target Date  10/14/17      PT LONG TERM GOAL #4   Title  Pt will be able to walk with min limp and have no increase in pain up to 45 min to 1 hour, cane if needed.     Time  8    Period  Weeks    Status  New    Target Date  10/14/17            Plan - 08/24/17 1630    Clinical Impression Statement  Pt demonstrates improved right knee flexion ROM. Worked on patella mobility and quad activation. Continued with progressive ROM. Vaso at end of session for edema.     PT Next Visit Plan  ROM, gait, edema control     PT Home Exercise Plan  level 1-2 knee from HHPT     Consulted and Agree with Plan of Care  Patient       Patient will benefit from skilled therapeutic intervention in order to improve the following deficits and impairments:  Abnormal gait, Decreased endurance, Decreased mobility, Difficulty walking, Hypomobility, Impaired sensation, Cardiopulmonary status limiting activity, Decreased range of motion, Decreased scar mobility, Increased edema, Improper body mechanics, Obesity, Pain, Postural dysfunction, Impaired flexibility, Increased fascial restricitons, Decreased strength  Visit  Diagnosis: Localized edema  Muscle weakness (generalized)  Total knee replacement status, right  Stiffness of right knee, not elsewhere classified     Problem List Patient Active Problem List   Diagnosis Date Noted  . Total knee replacement status 07/28/2017  . Preoperative clearance 07/20/2017  . History of pulmonary embolus (PE) 07/20/2017  . Mild CAD 07/20/2017  . Pain in left ankle and joints of left foot 06/21/2017  . S/P TKR (total knee replacement), left 02/10/2017  . Chronic anticoagulation 01/26/2017  . Acute pulmonary embolus ([place])   . Primary osteoarthritis of left knee 07/28/2016  . Chest pain   . Abnormal EKG   . Pulmonary embolism  ([place]) 06/03/2016  . Unilateral primary osteoarthritis, right knee 08/08/2015  . Encounter for chronic pain management 04/06/2014  . Tobacco abuse 12/05/2013  . DVT (deep venous thrombosis) ([place]) 11/28/2013  . Candidate for statin therapy due to risk of future cardiovascular event 05/05/2013  . HTN (hypertension) 05/04/2013  . Bilateral knee pain 05/04/2013  . Lumbar back pain 05/04/2013  . Healthcare maintenance 05/04/2013  . BMI 33.0-33.9,adult 05/04/2013  . Hx of substance abuse 05/04/2013    Dorene Ar , [place] 08/24/2017, 4:32 PM  [facility] [address] [place], [place], [address] Phone: [phone]   Fax:  [phone]  Name: Jeffrey [name] MRN: [id number] Date of Birth: [birth year]  PHYSICAL THERAPY DISCHARGE SUMMARY  Visits from Start of Care: 2  Current functional level related to goals / functional outcomes: Unknown   Remaining deficits: Unknown as of this date, see above for most recent info    Education / Equipment: ROM, HEP, RICE  Plan: Patient agrees to discharge.  Patient goals were not met. Patient is being discharged due to not returning since the last visit.  ?????    Raeford Razor, PT 10/07/17 9:40 AM Phone: [phone] Fax: [phone]

## 2017-08-26 ENCOUNTER — Telehealth (INDEPENDENT_AMBULATORY_CARE_PROVIDER_SITE_OTHER): Payer: Self-pay | Admitting: Orthopaedic Surgery

## 2017-08-26 NOTE — Telephone Encounter (Signed)
Please advise 

## 2017-08-26 NOTE — Telephone Encounter (Signed)
Patient called wanting a refill for Oxycodone.  Please all patient to advise

## 2017-08-26 NOTE — Telephone Encounter (Signed)
Percocet #20.  1-2 tab daily prn

## 2017-08-27 ENCOUNTER — Other Ambulatory Visit (INDEPENDENT_AMBULATORY_CARE_PROVIDER_SITE_OTHER): Payer: Self-pay

## 2017-08-27 MED ORDER — OXYCODONE-ACETAMINOPHEN 5-325 MG PO TABS: ORAL_TABLET | ORAL | 0 refills | Status: DC

## 2017-08-27 NOTE — Telephone Encounter (Signed)
Rx printed, pending Dr Phoebe Sharps signature.

## 2017-08-30 ENCOUNTER — Telehealth (INDEPENDENT_AMBULATORY_CARE_PROVIDER_SITE_OTHER): Payer: Self-pay | Admitting: Orthopaedic Surgery

## 2017-08-30 NOTE — Telephone Encounter (Signed)
PENDING SIGNATURE. WILL CALL HIM TOMORROW WHEN DR Erlinda Hong SIGNS IT

## 2017-08-30 NOTE — Telephone Encounter (Signed)
Patient called advised he has been out of his pain medicine since Friday. Patient asked for a call when ever he can pick up Rx. The number to contact patient is [phone]

## 2017-08-31 ENCOUNTER — Telehealth: Payer: Self-pay | Admitting: Physical Therapy

## 2017-08-31 ENCOUNTER — Ambulatory Visit: Payer: 59 | Attending: Physician Assistant | Admitting: Physical Therapy

## 2017-08-31 MED FILL — OXYCODONE-ACETAMINOPHEN 5-3: 5-325 | 10 days supply | Qty: 20 | Fill #0

## 2017-08-31 NOTE — Telephone Encounter (Signed)
Rx ready for pick up at the front desk patient aware.

## 2017-08-31 NOTE — Telephone Encounter (Signed)
Left message regarding no show to appointment today. Asked him to call if he cannot make his appointment tomorrow.

## 2017-09-01 ENCOUNTER — Ambulatory Visit: Payer: 59 | Admitting: Physical Therapy

## 2017-09-01 ENCOUNTER — Telehealth: Payer: Self-pay | Admitting: Physical Therapy

## 2017-09-01 NOTE — Telephone Encounter (Signed)
Left message regarding third consecutive no-show appointment. Per our attendance policy, his future appointments have been canceled and he will need to call to be rescheduled if he would like to resume Physical Therapy.

## 2017-09-02 ENCOUNTER — Ambulatory Visit: Payer: 59 | Admitting: Physical Therapy

## 2017-09-07 ENCOUNTER — Encounter: Payer: 59 | Admitting: Physical Therapy

## 2017-09-07 ENCOUNTER — Ambulatory Visit: Payer: 59 | Admitting: Pharmacist

## 2017-09-07 NOTE — Progress Notes (Deleted)
[name]                 DOB: [birth year]                      MRN: [id number]     HPI: Jeffrey [name] is a 52 y.o. male patient of Dr Radford Pax referred to HTN clinic. PMH is significant for recurrent DVT/PEs, elevated calcium score of 392 with mild nonobstructive CAD, severe hepatic steatosis, and HTN. He was recently started on lisinopril 10mg  daily for additional BP control with f/u BMET wnl.   He recently underwent right TKA and post-op pain may have contributed to elevated BP readings.  Smoking cessation Increase lisinopril if needed  Current HTN meds: lisinopril 10mg  daily, amlodipine 10mg  daily BP goal: <130/99mmHg  Family History: The patient's family history includes Alcohol abuse in his brother and mother; Arthritis in his father, maternal grandfather, maternal grandmother, mother, and other; Asthma in his cousin; Clotting disorder in his mother; Depression in his mother; Drug abuse in his brother; Heart disease in his maternal grandfather, maternal grandmother, mother, and other; Hypertension in his mother; Kidney disease in his mother; Sickle cell anemia in his brother.   Social History: Smokes 1 PPD. Drinks a 6 pack a week.  Diet:   Exercise:   Home BP readings:   Wt Readings from Last 3 Encounters:  08/12/17 263 lb 6.4 oz (119.5 kg)  07/28/17 264 lb (119.7 kg)  07/21/17 264 lb (119.7 kg)   BP Readings from Last 3 Encounters:  08/12/17 (!) 150/100  07/30/17 (!) 150/96  07/21/17 140/90   Pulse Readings from Last 3 Encounters:  08/12/17 (!) 111  07/30/17 78  07/21/17 98    Renal function: CrCl cannot be calculated (Patient's most recent lab result is older than the maximum 21 days allowed.).  Past Medical History:  Diagnosis Date  . Acute pulmonary embolus (HCC)    bilateral submassive PE in setting of missing several doses of Xarelto for his DVT  . Arthritis   . DVT (deep venous thrombosis) ([place]) 11/28/2013   RT LEG  . High cholesterol     . Hypertension   . Marijuana use     Current Outpatient Medications on File Prior to Visit  Medication Sig Dispense Refill  . amLODipine (NORVASC) 10 MG tablet Take 1 tablet (10 mg total) by mouth daily. 90 tablet 3  . apixaban (ELIQUIS) 5 MG TABS tablet Take 1 tablet (5 mg total) by mouth 2 (two) times daily. 60 tablet 3  . atorvastatin (LIPITOR) 40 MG tablet TAKE 1 TABLET BY MOUTH DAILY. 30 tablet 0  . ciprofloxacin (CIPRO) 500 MG tablet Take 1 tablet (500 mg total) by mouth 2 (two) times daily. 14 tablet 0  . lisinopril (PRINIVIL,ZESTRIL) 10 MG tablet Take 1 tablet (10 mg total) by mouth daily. 90 tablet 3  . methocarbamol (ROBAXIN) 750 MG tablet Take 1 tablet (750 mg total) by mouth 2 (two) times daily as needed for muscle spasms. 60 tablet 0  . mupirocin ointment (BACTROBAN) 2 % Apply 1 application topically 2 (two) times daily. 22 g 0  . oxyCODONE-acetaminophen (PERCOCET/ROXICET) 5-325 MG tablet 1-2 tab daily prn 20 tablet 0   No current facility-administered medications on file prior to visit.     No Known Allergies   Assessment/Plan:  1. Hypertension -

## 2017-09-08 ENCOUNTER — Other Ambulatory Visit: Payer: Self-pay | Admitting: Internal Medicine

## 2017-09-08 ENCOUNTER — Encounter: Payer: 59 | Admitting: Physical Therapy

## 2017-09-08 MED FILL — AMLODIPINE BESYLATE 10 MG T: 10 | 90 days supply | Qty: 90 | Fill #2

## 2017-09-08 MED FILL — ELIQUIS 5 MG TABLET: 5 | 30 days supply | Qty: 60 | Fill #1

## 2017-09-08 MED FILL — SILDENAFIL 20 MG TABLET: 20 | 6 days supply | Qty: 30 | Fill #0

## 2017-09-08 MED FILL — ATORVASTATIN 40 MG TABLET: 40 | 30 days supply | Qty: 30 | Fill #0

## 2017-09-09 ENCOUNTER — Encounter (INDEPENDENT_AMBULATORY_CARE_PROVIDER_SITE_OTHER): Payer: Self-pay | Admitting: Orthopaedic Surgery

## 2017-09-09 ENCOUNTER — Ambulatory Visit (INDEPENDENT_AMBULATORY_CARE_PROVIDER_SITE_OTHER): Payer: 59 | Admitting: Orthopaedic Surgery

## 2017-09-09 ENCOUNTER — Encounter: Payer: 59 | Admitting: Physical Therapy

## 2017-09-09 ENCOUNTER — Ambulatory Visit (INDEPENDENT_AMBULATORY_CARE_PROVIDER_SITE_OTHER): Payer: 59

## 2017-09-09 DIAGNOSIS — Z96652 Presence of left artificial knee joint: Secondary | ICD-10-CM

## 2017-09-09 DIAGNOSIS — Z96651 Presence of right artificial knee joint: Secondary | ICD-10-CM

## 2017-09-09 MED ORDER — HYDROCODONE-ACETAMINOPHEN 5-325 MG PO TABS: 1.0000 | ORAL_TABLET | Freq: Four times a day (QID) | ORAL | 0 refills | Status: DC | PRN

## 2017-09-09 MED FILL — HYDROCODON-APAP 5-325: 5-325 | 7 days supply | Qty: 28 | Fill #0

## 2017-09-09 NOTE — Progress Notes (Signed)
Post-Op Visit Note   Patient: Jeffrey [name]           Date of Birth: 1966/02/08           MRN: [id number] Visit Date: 09/09/2017 PCP: Tonette Bihari, MD   Assessment & Plan:  Chief Complaint:  Chief Complaint  Patient presents with  . Right Knee - Pain, Routine Post Op   Visit Diagnoses:  1. Status post total right knee replacement   2. S/P TKR (total knee replacement), left     Plan: Patient is a pleasant 52 year old gentleman who presents to our clinic today 43 days status post right total knee replacement, date of surgery 08/05/2017.  He was doing well and progressing nicely in outpatient physical therapy until he fell this morning getting out of the shower.  His left leg when out in front of him and he fell on the anterior aspect of the right knee twisting his right leg.  Since then he has had increased pain.  Pain is worse with weightbearing.  Examination of his right knee shows a well-healed surgical incision without evidence of infection or cellulitis.  He does have a small effusion.  Calf is soft and nontender.  He is stable to valgus and varus stress.  He is able to fully straight leg raise.  His range of motion is from 0 to 95 degrees.  He does state he was at 105 degrees of flexion and physical therapy before today's injury.  I told him to take tomorrow off from therapy if he still has pain.  He will resume physical therapy next week.  Follow-up with [place] in 6 weeks time for recheck.  Follow-Up Instructions: Return in about 6 weeks (around 10/21/2017).   Orders:  Orders Placed This Encounter  Procedures  . XR KNEE 3 VIEW RIGHT   Meds ordered this encounter  Medications  . HYDROcodone-acetaminophen (NORCO) 5-325 MG tablet    Sig: Take 1 tablet by mouth every 6 (six) hours as needed for moderate pain.    Dispense:  28 tablet    Refill:  0    Imaging: Xr Knee 3 View Right  Result Date: 09/09/2017 Well-seated prosthesis without evidence of acute fracture or other  structural abnormalities   PMFS History: Patient Active Problem List   Diagnosis Date Noted  . Total knee replacement status 07/28/2017  . Preoperative clearance 07/20/2017  . History of pulmonary embolus (PE) 07/20/2017  . Mild CAD 07/20/2017  . Pain in left ankle and joints of left foot 06/21/2017  . S/P TKR (total knee replacement), left 02/10/2017  . Chronic anticoagulation 01/26/2017  . Acute pulmonary embolus ([place])   . Primary osteoarthritis of left knee 07/28/2016  . Chest pain   . Abnormal EKG   . Pulmonary embolism ([place]) 06/03/2016  . Unilateral primary osteoarthritis, right knee 08/08/2015  . Encounter for chronic pain management 04/06/2014  . Tobacco abuse 12/05/2013  . DVT (deep venous thrombosis) ([place]) 11/28/2013  . Candidate for statin therapy due to risk of future cardiovascular event 05/05/2013  . HTN (hypertension) 05/04/2013  . Bilateral knee pain 05/04/2013  . Lumbar back pain 05/04/2013  . Healthcare maintenance 05/04/2013  . BMI 33.0-33.9,adult 05/04/2013  . Hx of substance abuse 05/04/2013   Past Medical History:  Diagnosis Date  . Acute pulmonary embolus (HCC)    bilateral submassive PE in setting of missing several doses of Xarelto for his DVT  . Arthritis   . DVT (deep venous thrombosis) ([place])  11/28/2013   RT LEG  . High cholesterol   . Hypertension   . Marijuana use     Family History  Problem Relation Age of Onset  . Heart disease Other   . Arthritis Other   . Alcohol abuse Mother   . Heart disease Mother   . Depression Mother   . Hypertension Mother   . Kidney disease Mother   . Arthritis Mother   . Clotting disorder Mother   . Arthritis Father   . Alcohol abuse Brother   . Drug abuse Brother   . Sickle cell anemia Brother   . Arthritis Maternal Grandmother   . Heart disease Maternal Grandmother   . Arthritis Maternal Grandfather   . Heart disease Maternal Grandfather   . Asthma Cousin     Past Surgical History:  Procedure  Laterality Date  . HERNIA REPAIR     9379 and 0240; umbilical hernia repair  . KNEE SURGERY Bilateral    Left knee 1993, right knee 2004  . TONSILLECTOMY    . TOTAL KNEE ARTHROPLASTY Left 02/10/2017   Procedure: LEFT TOTAL KNEE ARTHROPLASTY;  Surgeon: Leandrew Koyanagi, MD;  Location: [facility];  Service: Orthopedics;  Laterality: Left;  . TOTAL KNEE ARTHROPLASTY Right 07/28/2017   Procedure: RIGHT TOTAL KNEE ARTHROPLASTY;  Surgeon: Leandrew Koyanagi, MD;  Location: [facility];  Service: Orthopedics;  Laterality: Right;   Social History   Occupational History  . Not on file  Tobacco Use  . Smoking status: Current Every Day Smoker    Packs/day: 0.25    Years: 35.00    Pack years: 8.75    Types: Cigarettes    Start date: 10/07/1976  . Smokeless tobacco: Never Used  . Tobacco comment: Stopped for up to 5 years total - Peak rate 2ppd  Substance and Sexual Activity  . Alcohol use: Yes    Alcohol/week: 7.2 oz    Types: 12 Cans of beer per week    Comment: seldom wine/liquor  . Drug use: Yes    Types: Marijuana, Cocaine    Comment: Cocaine last used in 2004, current marijuana use  . Sexual activity: Yes    Birth control/protection: None    Comment: With wife

## 2017-09-13 ENCOUNTER — Encounter: Payer: 59 | Admitting: Physical Therapy

## 2017-09-15 ENCOUNTER — Encounter: Payer: 59 | Admitting: Physical Therapy

## 2017-09-16 ENCOUNTER — Encounter: Payer: 59 | Admitting: Physical Therapy

## 2017-09-22 ENCOUNTER — Telehealth (INDEPENDENT_AMBULATORY_CARE_PROVIDER_SITE_OTHER): Payer: Self-pay | Admitting: Orthopaedic Surgery

## 2017-09-22 NOTE — Telephone Encounter (Signed)
Please advise 

## 2017-09-22 NOTE — Telephone Encounter (Signed)
He's still out of work

## 2017-09-22 NOTE — Telephone Encounter (Signed)
Patient is requesting an updated return to work note be faxed to his lawyer.  Fax # [phone] attn: Lourena Simmonds  Patient would like a call back once this has been done. # [phone]

## 2017-09-23 ENCOUNTER — Encounter (INDEPENDENT_AMBULATORY_CARE_PROVIDER_SITE_OTHER): Payer: Self-pay

## 2017-09-23 MED FILL — traMADol HCL 50 MG TABS: 50 | 30 days supply | Qty: 90 | Fill #2

## 2017-09-23 NOTE — Telephone Encounter (Signed)
Please advise 

## 2017-09-23 NOTE — Telephone Encounter (Signed)
He cannot work until next appt

## 2017-09-23 NOTE — Telephone Encounter (Signed)
Note made and faxed to number below.

## 2017-09-23 NOTE — Telephone Encounter (Signed)
Lourena Simmonds called to say he needs a more detailed letter stating if patient is able or unable to work and if he will eventually be able they need a time period of when he can work again. Fax letter again to # [phone] attn Julian Hy

## 2017-09-23 NOTE — Telephone Encounter (Signed)
Called patient to let him know this has been done.

## 2017-09-24 ENCOUNTER — Encounter (INDEPENDENT_AMBULATORY_CARE_PROVIDER_SITE_OTHER): Payer: Self-pay

## 2017-09-24 NOTE — Telephone Encounter (Signed)
Faxed new note

## 2017-10-05 ENCOUNTER — Other Ambulatory Visit: Payer: Self-pay

## 2017-10-05 NOTE — Telephone Encounter (Signed)
Patient called for refill on Tramadol. Appt made for 10/15/17 to establish with newly assigned PCP.  Please call when refill sent.  Danley Danker, RN [facility] RN)

## 2017-10-06 NOTE — Telephone Encounter (Signed)
Addendum: patient should follow up with orthopedics for further refills.

## 2017-10-06 NOTE — Telephone Encounter (Signed)
Called patient and informed him that he needed to have his Tramadol RX filled by orthopedics. He informed me that he has been on Tramadol since before he started going to orthopedics. He also states that he is out of medication and that he wishes that he had known that Dr. Emmaline Life was leaving and he would have filled it prior to her leaving. He is very frustrated and in a lot of pain.  Ozella Almond, [place]

## 2017-10-06 NOTE — Telephone Encounter (Signed)
Pt calls to check status.  He states he stopped tramadol briefly a he was given hydrocodone @ discharge.   He has not been on hydrocodone since the beginning of January.  Advised I would inform MD. Clinton Sawyer, Salome Spotted, [place]

## 2017-10-06 NOTE — Telephone Encounter (Signed)
Please inform patient that since orthopedics is managing patient's conditioning and managing pain management he should follow up with orthopedics for further referrals.   Dalphine Handing, PGY-2 [facility] 10/06/2017 4:58 PM

## 2017-10-06 NOTE — Telephone Encounter (Signed)
Please inform patient that I will not refill prescription. Tramadol is not in patient's medication list and last discharge summary from orthopedics (07/30/2017) stating that patient was discontinued from Tramadol. Patient may need to clarify with orthopedics if he would like a refill of tramadol.   Dalphine Handing, PGY-2 [facility] 10/06/2017 7:37 AM

## 2017-10-11 ENCOUNTER — Telehealth (INDEPENDENT_AMBULATORY_CARE_PROVIDER_SITE_OTHER): Payer: Self-pay | Admitting: Orthopaedic Surgery

## 2017-10-11 ENCOUNTER — Other Ambulatory Visit (INDEPENDENT_AMBULATORY_CARE_PROVIDER_SITE_OTHER): Payer: Self-pay

## 2017-10-11 NOTE — Telephone Encounter (Signed)
Please call pt when med is ready    Med refill  Tramadol

## 2017-10-11 NOTE — Telephone Encounter (Signed)
Called patient no answer. Would like to know what pharmacy he would like to use so that I can call in Rx into pharm. Will try again later.

## 2017-10-11 NOTE — Telephone Encounter (Signed)
Patient called advised he uses [facility] outpatient pharmacy. Patient asked for a call when Rx is called in. The number to contact patient is [phone]

## 2017-10-11 NOTE — Telephone Encounter (Signed)
Please advise 

## 2017-10-11 NOTE — Telephone Encounter (Signed)
30

## 2017-10-12 ENCOUNTER — Other Ambulatory Visit (INDEPENDENT_AMBULATORY_CARE_PROVIDER_SITE_OTHER): Payer: Self-pay

## 2017-10-12 MED ORDER — TRAMADOL HCL 50 MG PO TABS: 50.0000 mg | ORAL_TABLET | Freq: Two times a day (BID) | ORAL | 0 refills | Status: DC | PRN

## 2017-10-12 NOTE — Telephone Encounter (Signed)
Called Rx into pharm. Called patient no answer did not want to leave a voicemail bc it states someone elses name. Will call back to let him know Rx was sent to pharm.

## 2017-10-12 NOTE — Telephone Encounter (Signed)
Called patient to let him know. No answer. LMOM

## 2017-10-13 NOTE — Telephone Encounter (Signed)
[place] [facility] called and is questioning the tramadol called in for patient, he had received a 30 day supply of tramadol on 6/27. Please call back to let them know to refill or wait another few days. Pharmacys # [phone]

## 2017-10-13 NOTE — Telephone Encounter (Signed)
Please advise 

## 2017-10-13 NOTE — Telephone Encounter (Signed)
Ok then deny it then

## 2017-10-13 NOTE — Telephone Encounter (Signed)
Pt called has questions about med and wanted to know if we spoke to the pharmacy

## 2017-10-14 NOTE — Telephone Encounter (Signed)
Pt had tramadol 09/23/17 #90 called and advised pharm to cancel rx from Dr. Erlinda Hong also called and lm on vm for pt to advise that this was done. To call with any questions.

## 2017-10-15 ENCOUNTER — Ambulatory Visit: Payer: 59 | Admitting: Family Medicine

## 2017-10-21 ENCOUNTER — Encounter (INDEPENDENT_AMBULATORY_CARE_PROVIDER_SITE_OTHER): Payer: Self-pay | Admitting: Orthopaedic Surgery

## 2017-10-21 ENCOUNTER — Ambulatory Visit (INDEPENDENT_AMBULATORY_CARE_PROVIDER_SITE_OTHER): Payer: 59 | Admitting: Orthopaedic Surgery

## 2017-10-21 DIAGNOSIS — Z96651 Presence of right artificial knee joint: Secondary | ICD-10-CM

## 2017-10-21 MED ORDER — TRAMADOL HCL 50 MG PO TABS: 50.0000 mg | ORAL_TABLET | Freq: Three times a day (TID) | ORAL | 2 refills | Status: DC | PRN

## 2017-10-21 MED FILL — traMADol HCL 50 MG TABS: 50 | 10 days supply | Qty: 30 | Fill #0

## 2017-10-21 NOTE — Progress Notes (Signed)
Patient is almost 3 months status post right total knee replacement.  Overall he is doing well.  He is happy with a cane.  His surgical scar is fully healed.  He has excellent range of motion.  He is overall happy with his improvement in his recovery.  My standpoint he is doing well.  I will refill.  Continue with physical therapy for strengthening and gait training.  Recheck in 3 months with 2 view x-rays of the right knee.

## 2017-10-29 MED FILL — traMADol HCL 50 MG TABS: 50 | 10 days supply | Qty: 30 | Fill #1

## 2017-11-12 ENCOUNTER — Other Ambulatory Visit: Payer: Self-pay | Admitting: Internal Medicine

## 2017-11-12 MED FILL — ELIQUIS 5 MG TABLET: 5 | 30 days supply | Qty: 60 | Fill #2

## 2017-11-12 MED FILL — ATORVASTATIN 40 MG TABLET: 40 | 30 days supply | Qty: 30 | Fill #0

## 2017-11-12 MED FILL — traMADol HCL 50 MG TABS: 50 | 10 days supply | Qty: 30 | Fill #2

## 2017-12-13 ENCOUNTER — Telehealth (INDEPENDENT_AMBULATORY_CARE_PROVIDER_SITE_OTHER): Payer: Self-pay | Admitting: Orthopaedic Surgery

## 2017-12-13 NOTE — Telephone Encounter (Signed)
[facility] outpatient   Tramadol (Ultram) 50 mg tablet    Please call patient when med is sent

## 2017-12-14 ENCOUNTER — Other Ambulatory Visit (INDEPENDENT_AMBULATORY_CARE_PROVIDER_SITE_OTHER): Payer: Self-pay

## 2017-12-14 MED ORDER — TRAMADOL HCL 50 MG PO TABS: 50.0000 mg | ORAL_TABLET | Freq: Two times a day (BID) | ORAL | 0 refills | Status: DC

## 2017-12-14 MED FILL — traMADol HCL 50 MG TABS: 50 | 15 days supply | Qty: 30 | Fill #0

## 2017-12-14 NOTE — Telephone Encounter (Signed)
Called pt- LMOM with details 

## 2017-12-14 NOTE — Telephone Encounter (Signed)
Patient would like a refill on Rx. See message below.

## 2017-12-14 NOTE — Telephone Encounter (Signed)
30

## 2017-12-14 NOTE — Telephone Encounter (Signed)
Called into pharm  

## 2017-12-22 ENCOUNTER — Telehealth (INDEPENDENT_AMBULATORY_CARE_PROVIDER_SITE_OTHER): Payer: Self-pay | Admitting: Orthopaedic Surgery

## 2017-12-22 NOTE — Telephone Encounter (Signed)
Patient called wanting a callback in regards to an updated letter.  Please call patient.  [phone]

## 2017-12-24 ENCOUNTER — Encounter (INDEPENDENT_AMBULATORY_CARE_PROVIDER_SITE_OTHER): Payer: Self-pay

## 2017-12-24 ENCOUNTER — Other Ambulatory Visit (INDEPENDENT_AMBULATORY_CARE_PROVIDER_SITE_OTHER): Payer: Self-pay

## 2017-12-24 MED ORDER — TRAMADOL HCL 50 MG PO TABS: 50.0000 mg | ORAL_TABLET | Freq: Two times a day (BID) | ORAL | 0 refills | Status: DC

## 2017-12-24 NOTE — Telephone Encounter (Signed)
Would like a work note to continue to be out of work. Looks like he has appt scheduled 01/21/18.   Would like refill on tramadol. Has 4 pills left. He uses  [facility].    CB [phone]

## 2017-12-24 NOTE — Telephone Encounter (Signed)
[place] for out of work until [employer].  Ok to refill tramadol

## 2017-12-24 NOTE — Telephone Encounter (Signed)
Note made. Called Rx into pharm. Called patient no answer LMOM

## 2017-12-27 MED FILL — traMADol HCL 50 MG TABS: 50 | 15 days supply | Qty: 30 | Fill #0

## 2017-12-28 ENCOUNTER — Telehealth (INDEPENDENT_AMBULATORY_CARE_PROVIDER_SITE_OTHER): Payer: Self-pay | Admitting: Orthopaedic Surgery

## 2017-12-28 NOTE — Telephone Encounter (Signed)
FAXED

## 2017-12-28 NOTE — Telephone Encounter (Signed)
Patient called to state he need letter faxed to:  Attn: Lourena Simmonds Fax# [phone]  Patient stated has no transportation.  Please call patient to advise [phone]

## 2018-01-12 ENCOUNTER — Inpatient Hospital Stay: Payer: 59 | Admitting: Oncology

## 2018-01-12 ENCOUNTER — Telehealth: Payer: Self-pay | Admitting: Oncology

## 2018-01-12 NOTE — Telephone Encounter (Signed)
Appt r/s and patient notified per 10/16 sch msg

## 2018-01-21 ENCOUNTER — Ambulatory Visit (INDEPENDENT_AMBULATORY_CARE_PROVIDER_SITE_OTHER): Payer: 59

## 2018-01-21 ENCOUNTER — Ambulatory Visit (INDEPENDENT_AMBULATORY_CARE_PROVIDER_SITE_OTHER): Payer: 59 | Admitting: Orthopaedic Surgery

## 2018-01-21 DIAGNOSIS — Z96651 Presence of right artificial knee joint: Secondary | ICD-10-CM

## 2018-01-21 MED ORDER — TRAMADOL HCL 50 MG PO TABS: 50.0000 mg | ORAL_TABLET | Freq: Two times a day (BID) | ORAL | 0 refills | Status: DC | PRN

## 2018-01-21 MED FILL — traMADol HCL 50 MG TABS: 50 | 30 days supply | Qty: 60 | Fill #0

## 2018-01-21 NOTE — Progress Notes (Signed)
Office Visit Note   Patient: Jeffrey [name]           Date of Birth: 1965/05/17           MRN: [id number] Visit Date: 01/21/2018              Requested by: Caroline More, DO [address], [place] [address] PCP: Caroline More, DO   Assessment & Plan: Visit Diagnoses:  1. Status post right knee replacement     Plan: Impression is 6 months status post right total knee replacement doing well.  One last refill for tramadol for his back pain.  He understands that he needs to see his PCP for the this.  Dental prophylaxis was reinforced.  Recheck in 6 months with 2 view x-rays of bilateral knees.  Follow-Up Instructions: Return in about 6 months (around 07/23/2018).   Orders:  Orders Placed This Encounter  Procedures  . XR Knee 1-2 Views Right   Meds ordered this encounter  Medications  . traMADol (ULTRAM) 50 MG tablet    Sig: Take 1 tablet (50 mg total) by mouth 2 (two) times daily as needed.    Dispense:  60 tablet    Refill:  0      Procedures: No procedures performed   Clinical Data: No additional findings.   Subjective: Chief Complaint  Patient presents with  . Right Knee - Follow-up    Jeffrey [name] is 6 months status post right total knee replacement 1 year status post left total knee replacement.  Overall he is doing well.  He states that he has had a noticeable improvement in knee pain since the surgeries.  He is very happy with his recovery.  He has resumed his normal activities.  He still reports some start up stiffness and discomfort in his back in the morning.   Review of Systems   Objective: Vital Signs: There were no vitals taken for this visit.  Physical Exam  Ortho Exam Right knee exam shows a fully healed surgical scar.  Excellent range of motion.  Stable collaterals. Specialty Comments:  No specialty comments available.  Imaging: Xr Knee 1-2 Views Right  Result Date: 01/21/2018 Stable total knee replacement in good alignment      PMFS History: Patient Active Problem List   Diagnosis Date Noted  . Total knee replacement status 07/28/2017  . Preoperative clearance 07/20/2017  . History of pulmonary embolus (PE) 07/20/2017  . Mild CAD 07/20/2017  . Pain in left ankle and joints of left foot 06/21/2017  . S/P TKR (total knee replacement), left 02/10/2017  . Chronic anticoagulation 01/26/2017  . Acute pulmonary embolus ([place] [facility])   . Primary osteoarthritis of left knee 07/28/2016  . Chest pain   . Abnormal EKG   . Pulmonary embolism ([place]) 06/03/2016  . Unilateral primary osteoarthritis, right knee 08/08/2015  . Encounter for chronic pain management 04/06/2014  . Tobacco abuse 12/05/2013  . DVT (deep venous thrombosis) ([facility]) 11/28/2013  . Candidate for statin therapy due to risk of future cardiovascular event 05/05/2013  . HTN (hypertension) 05/04/2013  . Bilateral knee pain 05/04/2013  . Lumbar back pain 05/04/2013  . Healthcare maintenance 05/04/2013  . BMI 33.0-33.9,adult 05/04/2013  . Hx of substance abuse ([place]) 05/04/2013   Past Medical History:  Diagnosis Date  . Acute pulmonary embolus (HCC)    bilateral submassive PE in setting of missing several doses of Xarelto for his DVT  . Arthritis   . DVT (deep venous thrombosis) (  [place]) 11/28/2013   RT LEG  . High cholesterol   . Hypertension   . Marijuana use     Family History  Problem Relation Age of Onset  . Heart disease Other   . Arthritis Other   . Alcohol abuse Mother   . Heart disease Mother   . Depression Mother   . Hypertension Mother   . Kidney disease Mother   . Arthritis Mother   . Clotting disorder Mother   . Arthritis Father   . Alcohol abuse Brother   . Drug abuse Brother   . Sickle cell anemia Brother   . Arthritis Maternal Grandmother   . Heart disease Maternal Grandmother   . Arthritis Maternal Grandfather   . Heart disease Maternal Grandfather   . Asthma Cousin     Past Surgical History:  Procedure Laterality Date  .  HERNIA REPAIR     4492 and 0100; umbilical hernia repair  . KNEE SURGERY Bilateral    Left knee 1993, right knee 2004  . TONSILLECTOMY    . TOTAL KNEE ARTHROPLASTY Left 02/10/2017   Procedure: LEFT TOTAL KNEE ARTHROPLASTY;  Surgeon: Leandrew Koyanagi, MD;  Location: [facility];  Service: Orthopedics;  Laterality: Left;  . TOTAL KNEE ARTHROPLASTY Right 07/28/2017   Procedure: RIGHT TOTAL KNEE ARTHROPLASTY;  Surgeon: Leandrew Koyanagi, MD;  Location: [facility];  Service: Orthopedics;  Laterality: Right;   Social History   Occupational History  . Not on file  Tobacco Use  . Smoking status: Current Every Day Smoker    Packs/day: 0.25    Years: 35.00    Pack years: 8.75    Types: Cigarettes    Start date: 10/07/1976  . Smokeless tobacco: Never Used  . Tobacco comment: Stopped for up to 5 years total - Peak rate 2ppd  Substance and Sexual Activity  . Alcohol use: Yes    Alcohol/week: 12.0 standard drinks    Types: 12 Cans of beer per week    Comment: seldom wine/liquor  . Drug use: Yes    Types: Marijuana, Cocaine    Comment: Cocaine last used in 2004, current marijuana use  . Sexual activity: Yes    Birth control/protection: None    Comment: With wife

## 2018-01-25 ENCOUNTER — Telehealth: Payer: Self-pay | Admitting: Oncology

## 2018-01-25 ENCOUNTER — Inpatient Hospital Stay: Payer: 59 | Admitting: Oncology

## 2018-01-25 NOTE — Telephone Encounter (Signed)
R/s appt per 10/29 sch message - pt is aware of appt date and time  

## 2018-02-01 ENCOUNTER — Other Ambulatory Visit: Payer: Self-pay | Admitting: Internal Medicine

## 2018-02-01 ENCOUNTER — Other Ambulatory Visit: Payer: Self-pay | Admitting: Family Medicine

## 2018-02-01 MED FILL — ELIQUIS 5 MG TABLET: 5 | 30 days supply | Qty: 60 | Fill #0

## 2018-02-01 MED FILL — ATORVASTATIN 40 MG TABLET: 40 | 30 days supply | Qty: 30 | Fill #0

## 2018-02-01 MED FILL — LISINOPRIL 10 MG TABS: 10 | 90 days supply | Qty: 90 | Fill #1

## 2018-02-16 ENCOUNTER — Inpatient Hospital Stay: Payer: 59 | Attending: Hematology and Oncology | Admitting: Oncology

## 2018-02-16 VITALS — BP 140/92 | HR 78 | Temp 98.3°F | Resp 17 | Ht 75.0 in | Wt 278.5 lb

## 2018-02-16 DIAGNOSIS — Z96651 Presence of right artificial knee joint: Secondary | ICD-10-CM | POA: Diagnosis not present

## 2018-02-16 DIAGNOSIS — Z7901 Long term (current) use of anticoagulants: Secondary | ICD-10-CM | POA: Insufficient documentation

## 2018-02-16 DIAGNOSIS — I825Y1 Chronic embolism and thrombosis of unspecified deep veins of right proximal lower extremity: Secondary | ICD-10-CM | POA: Diagnosis not present

## 2018-02-16 DIAGNOSIS — Z79899 Other long term (current) drug therapy: Secondary | ICD-10-CM | POA: Insufficient documentation

## 2018-02-16 DIAGNOSIS — Z96652 Presence of left artificial knee joint: Secondary | ICD-10-CM | POA: Diagnosis not present

## 2018-02-16 NOTE — Progress Notes (Signed)
Hematology and Oncology Follow Up Visit  Jeffrey [name] [id number] Aug 26, 1965 52 y.o. 02/16/2018 8:38 AM Jeffrey [name], DOMikell, Jeani Sow, MD   Principle Diagnosis: 52 year old man with recurrent thromboembolism diagnosed in 2015.  He was found to have submassive bilateral pulmonary embolism with right heart strain in March 2018.  Current therapy: Chronic anticoagulation with Eliquis indefinitely.  Interim History: Jeffrey [name] presents today for a follow-up visit.  He is 52 year old man seen in the past by Dr. Lebron Conners and his thrombophilia work-up did not reveal any inherited or acquired thrombophilia but his presentation was very suspicious for high risk of recurrent thromboembolism and recommended lifetime anticoagulation.  Since the last visit, he underwent left total knee replacement in November 2018 and a right total knee replacement in May 2019.  He tolerated both procedures well without any postoperative complications or thrombosis.  Since he completed these procedures, he has felt better and recovering slowly.  He does ambulate with the help of a cane without any falls or syncope.  He denies any bleeding complications.  He does not report any headaches, blurry vision, syncope or seizures. Does not report any fevers, chills or sweats.  Does not report any cough, wheezing or hemoptysis.  Does not report any chest pain, palpitation, orthopnea or leg edema.  Does not report any nausea, vomiting or abdominal pain.  Does not report any constipation or diarrhea.  Does not report any skeletal complaints.    Does not report frequency, urgency or hematuria.  Does not report any skin rashes or lesions. Does not report any heat or cold intolerance.  Does not report any lymphadenopathy or petechiae.  Does not report any anxiety or depression.  Remaining review of systems is negative.    Medications: I have reviewed the patient's current medications.  Current Outpatient Medications  Medication Sig  Dispense Refill  . amLODipine (NORVASC) 10 MG tablet Take 1 tablet (10 mg total) by mouth daily. 90 tablet 3  . atorvastatin (LIPITOR) 40 MG tablet TAKE 1 TABLET BY MOUTH DAILY. 30 tablet 2  . ciprofloxacin (CIPRO) 500 MG tablet Take 1 tablet (500 mg total) by mouth 2 (two) times daily. 14 tablet 0  . ELIQUIS 5 MG TABS tablet TAKE 1 TABLET BY MOUTH 2 TIMES DAILY. 60 tablet 3  . HYDROcodone-acetaminophen (NORCO) 5-325 MG tablet Take 1 tablet by mouth every 6 (six) hours as needed for moderate pain. 28 tablet 0  . lisinopril (PRINIVIL,ZESTRIL) 10 MG tablet Take 1 tablet (10 mg total) by mouth daily. 90 tablet 3  . methocarbamol (ROBAXIN) 750 MG tablet Take 1 tablet (750 mg total) by mouth 2 (two) times daily as needed for muscle spasms. (Patient not taking: Reported on 09/09/2017) 60 tablet 0  . mupirocin ointment (BACTROBAN) 2 % Apply 1 application topically 2 (two) times daily. (Patient not taking: Reported on 09/09/2017) 22 g 0  . oxyCODONE-acetaminophen (PERCOCET/ROXICET) 5-325 MG tablet 1-2 tab daily prn (Patient not taking: Reported on 09/09/2017) 20 tablet 0  . traMADol (ULTRAM) 50 MG tablet Take 1 tablet (50 mg total) by mouth 2 (two) times daily as needed. 30 tablet 0  . traMADol (ULTRAM) 50 MG tablet Take 1 tablet (50 mg total) by mouth 2 (two) times daily. 30 tablet 0  . traMADol (ULTRAM) 50 MG tablet Take 1 tablet (50 mg total) by mouth 2 (two) times daily as needed. 60 tablet 0   No current facility-administered medications for this visit.      Allergies: No Known Allergies  Past Medical History, Surgical history, Social history, and Family History were reviewed and updated.    Physical Exam:  ECOG:  General appearance: alert and cooperative appeared without distress. Head: Normocephalic, without obvious abnormality Oropharynx: No oral thrush or ulcers. Eyes: No scleral icterus.  Pupils are equal and round reactive to light. Lymph nodes: Cervical, supraclavicular, and axillary  nodes normal. Heart:regular rate and rhythm, S1, S2 normal, no murmur, click, rub or gallop Lung:chest clear, no wheezing, rales, normal symmetric air entry Abdomin: soft, non-tender, without masses or organomegaly. Neurological: No motor, sensory deficits.  Intact deep tendon reflexes. Skin: No rashes or lesions.  No ecchymosis or petechiae. Musculoskeletal: No joint deformity or effusion. Psychiatric: Mood and affect are appropriate.    Lab Results: Lab Results  Component Value Date   WBC 6.3 07/29/2017   HGB 13.0 07/29/2017   HCT 39.6 07/29/2017   MCV 88.6 07/29/2017   PLT 156 07/29/2017     Chemistry      Component Value Date/Time   NA 133 (L) 08/12/2017 1040   NA 138 12/23/2016 1240   K 4.5 08/12/2017 1040   K 3.9 12/23/2016 1240   CL 97 08/12/2017 1040   CO2 22 08/12/2017 1040   CO2 26 12/23/2016 1240   BUN 8 08/12/2017 1040   BUN 8.8 12/23/2016 1240   CREATININE 0.86 08/12/2017 1040   CREATININE 1.0 12/23/2016 1240      Component Value Date/Time   CALCIUM 9.3 08/12/2017 1040   CALCIUM 9.6 12/23/2016 1240   ALKPHOS 87 07/19/2017 1130   ALKPHOS 84 12/23/2016 1240   AST 40 07/19/2017 1130   AST 29 12/23/2016 1240   ALT 26 07/19/2017 1130   ALT 30 12/23/2016 1240   BILITOT 0.6 07/19/2017 1130   BILITOT 0.48 12/23/2016 1240       Impression and Plan:   52 year old man with the following:  1.  Recurrent thromboembolism initially detected in 2015 and developed submassive pulmonary embolism in 2018.  Etiology is unclear and appears to be unprovoked and his thrombophilia work-up was unrevealing.  He remains on Eliquis at this time without any complications.  Risks and benefits of continuing this therapy was discussed today and I have recommended continue lifetime anticoagulation with Eliquis unless any other complications arise.  These risks were include GI bleeding and rarely CNS bleeds.  His risk of recurrent thrombosis is high without anticoagulation.   Bridging with Lovenox may be needed depending on the procedure in the future.  After discussion today he is agreeable to continue with lifetime anticoagulation at this time.  2.  Follow-up: I am happy to see him in the future as needed.   15  minutes was spent with the patient face-to-face today.  [name] than 50% of time was dedicated to reviewing laboratory data, imaging studies and coordinating plan of care.    Zola Button, MD 11/20/20198:38 AM

## 2018-02-18 ENCOUNTER — Telehealth: Payer: Self-pay

## 2018-02-18 NOTE — Telephone Encounter (Signed)
Per 11/20 no los °

## 2018-03-16 ENCOUNTER — Telehealth (INDEPENDENT_AMBULATORY_CARE_PROVIDER_SITE_OTHER): Payer: Self-pay | Admitting: Orthopaedic Surgery

## 2018-03-16 NOTE — Telephone Encounter (Signed)
See message below please be very specific on work note.

## 2018-03-16 NOTE — Telephone Encounter (Signed)
Patient called needing letter stating he can return to work in April with restrictions. Patient asked if the letter can be faxed to his attorney. Lourena Simmonds) The fax# is [phone] The number to contact patient is [phone]

## 2018-03-16 NOTE — Telephone Encounter (Signed)
yes

## 2018-03-17 NOTE — Telephone Encounter (Signed)
Can they let me know exact restrictions so I can determine which ones he should adhere to

## 2018-03-17 NOTE — Telephone Encounter (Signed)
What would you like the work note to say?

## 2018-03-21 ENCOUNTER — Telehealth (INDEPENDENT_AMBULATORY_CARE_PROVIDER_SITE_OTHER): Payer: Self-pay | Admitting: Orthopaedic Surgery

## 2018-03-21 NOTE — Telephone Encounter (Signed)
Patient called this morning stating:  Patient called stating no heavy lifting, no standing at long periods of time is what he wants included in letter.  Please call and let him know when it can be faxed to lawyer. [phone]  Please advise.

## 2018-03-21 NOTE — Telephone Encounter (Signed)
Duplicate message in chart. Message has been sent to Dr. Erlinda Hong and awaiting his response.

## 2018-03-21 NOTE — Telephone Encounter (Signed)
yes

## 2018-03-21 NOTE — Telephone Encounter (Signed)
Holding for Dr. Erlinda Hong to discuss exactly what note needs to state. Question return to work in April?

## 2018-03-21 NOTE — Telephone Encounter (Signed)
Patient called stating no heavy lifting, no standing at long periods of time is what he wants included in letter.  Please call and let him know when it can be faxed to lawyer. [phone]

## 2018-03-24 NOTE — Telephone Encounter (Signed)
Can you please see messages and responses from Dr. Erlinda Hong? I just wanted to be sure that the note is supposed to have patient returning to work in April.

## 2018-03-24 NOTE — Telephone Encounter (Signed)
I called and left message

## 2018-03-24 NOTE — Telephone Encounter (Signed)
See message below °

## 2018-03-28 NOTE — Telephone Encounter (Signed)
That's fine

## 2018-03-28 NOTE — Telephone Encounter (Signed)
Hold for Dr. Erlinda Hong, been several months since surgery. Dr. Erlinda Hong will have to address.

## 2018-03-28 NOTE — Telephone Encounter (Signed)
Ok. Thanks!

## 2018-03-28 NOTE — Telephone Encounter (Signed)
See message.

## 2018-03-28 NOTE — Telephone Encounter (Signed)
Spoke with patient its ok to write  letter just add he  will be out until his next f/u in April   No Heavy Lifting  No standing for long periods of time   Please fax letter to [employer]  [phone]

## 2018-04-05 ENCOUNTER — Encounter (INDEPENDENT_AMBULATORY_CARE_PROVIDER_SITE_OTHER): Payer: Self-pay

## 2018-04-05 NOTE — Telephone Encounter (Signed)
Note given

## 2018-04-20 ENCOUNTER — Other Ambulatory Visit: Payer: Self-pay

## 2018-04-20 DIAGNOSIS — I10 Essential (primary) hypertension: Secondary | ICD-10-CM

## 2018-04-20 MED FILL — ELIQUIS 5 MG TABLET: 5 | 30 days supply | Qty: 60 | Fill #1

## 2018-04-20 MED FILL — ATORVASTATIN 40 MG TABLET: 40 | 30 days supply | Qty: 30 | Fill #1

## 2018-04-20 MED FILL — LISINOPRIL 10 MG TABLET: 10 | 90 days supply | Qty: 90 | Fill #2

## 2018-04-21 MED ORDER — AMLODIPINE BESYLATE 10 MG PO TABS: 10.0000 mg | ORAL_TABLET | Freq: Every day | ORAL | 3 refills | Status: DC

## 2018-04-21 MED FILL — AMLODIPINE BESYLATE 10 MG T: 10 | 90 days supply | Qty: 90 | Fill #0

## 2018-06-28 MED FILL — ELIQUIS 5 MG TABLET: 5 | 30 days supply | Qty: 60 | Fill #2

## 2018-06-28 MED FILL — ATORVASTATIN 40 MG TABLET: 40 | 30 days supply | Qty: 30 | Fill #2

## 2018-07-12 ENCOUNTER — Ambulatory Visit (INDEPENDENT_AMBULATORY_CARE_PROVIDER_SITE_OTHER): Payer: Self-pay

## 2018-07-12 ENCOUNTER — Ambulatory Visit (INDEPENDENT_AMBULATORY_CARE_PROVIDER_SITE_OTHER): Payer: 59

## 2018-07-12 ENCOUNTER — Other Ambulatory Visit: Payer: Self-pay

## 2018-07-12 ENCOUNTER — Ambulatory Visit (INDEPENDENT_AMBULATORY_CARE_PROVIDER_SITE_OTHER): Payer: 59 | Admitting: Orthopaedic Surgery

## 2018-07-12 ENCOUNTER — Encounter (INDEPENDENT_AMBULATORY_CARE_PROVIDER_SITE_OTHER): Payer: Self-pay | Admitting: Orthopaedic Surgery

## 2018-07-12 DIAGNOSIS — Z96651 Presence of right artificial knee joint: Secondary | ICD-10-CM | POA: Diagnosis not present

## 2018-07-12 DIAGNOSIS — Z96652 Presence of left artificial knee joint: Secondary | ICD-10-CM | POA: Diagnosis not present

## 2018-07-12 NOTE — Progress Notes (Signed)
Post-Op Visit Note   Patient: Jeffrey [name]           Date of Birth: 04-12-1965           MRN: [id number] Visit Date: 07/12/2018 PCP: Caroline More, DO   Assessment & Plan:  Chief Complaint:  Chief Complaint  Patient presents with  . Right Knee - Follow-up  . Left Knee - Follow-up   Visit Diagnoses:  1. Status post right knee replacement   2. Status post left knee replacement     Plan: Patient is a pleasant 53 year old gentleman who presents to our clinic today for follow-up of bilateral total knee replacements left total knee replacement 02/10/2017 and right total knee replacement 07/28/2017.  Doing very well with both knees.  No complaints.  No pain.  He is regained great range of motion and strength.  Examination of both knees reveal a fully healed surgical scar.  Range of motion 0 to 115 degrees.  Stable valgus varus stress.  Calves are soft and nontender.  He has neurovascularly intact distally.  At this point, he will continue working on range of motion strengthening exercises.  He will follow-up with [place] in 1 years time for repeat evaluation and 2 view x-rays of both knees.  Dental prophylaxis reinforced.  Call with concerns or questions in the meantime.  Follow-Up Instructions: Return in about 1 year (around 07/12/2019).   Orders:  Orders Placed This Encounter  Procedures  . XR Knee 1-2 Views Right  . XR Knee 1-2 Views Left   No orders of the defined types were placed in this encounter.   Imaging: Xr Knee 1-2 Views Left  Result Date: 07/12/2018 X-rays demonstrate a well-seated prosthesis without complication  Xr Knee 1-2 Views Right  Result Date: 07/12/2018 X-rays demonstrate a well-seated prosthesis without complication   PMFS History: Patient Active Problem List   Diagnosis Date Noted  . Status post right knee replacement 07/28/2017  . Preoperative clearance 07/20/2017  . History of pulmonary embolus (PE) 07/20/2017  . Mild CAD 07/20/2017  . Pain in  left ankle and joints of left foot 06/21/2017  . S/P TKR (total knee replacement), left 02/10/2017  . Chronic anticoagulation 01/26/2017  . Acute pulmonary embolus ([place])   . Primary osteoarthritis of left knee 07/28/2016  . Chest pain   . Abnormal EKG   . Pulmonary embolism ([place]) 06/03/2016  . Unilateral primary osteoarthritis, right knee 08/08/2015  . Encounter for chronic pain management 04/06/2014  . Tobacco abuse 12/05/2013  . DVT (deep venous thrombosis) ([place]) 11/28/2013  . Candidate for statin therapy due to risk of future cardiovascular event 05/05/2013  . HTN (hypertension) 05/04/2013  . Bilateral knee pain 05/04/2013  . Lumbar back pain 05/04/2013  . Healthcare maintenance 05/04/2013  . BMI 33.0-33.9,adult 05/04/2013  . Hx of substance abuse ([place]) 05/04/2013   Past Medical History:  Diagnosis Date  . Acute pulmonary embolus (HCC)    bilateral submassive PE in setting of missing several doses of Xarelto for his DVT  . Arthritis   . DVT (deep venous thrombosis) ([place]) 11/28/2013   RT LEG  . High cholesterol   . Hypertension   . Marijuana use     Family History  Problem Relation Age of Onset  . Heart disease Other   . Arthritis Other   . Alcohol abuse Mother   . Heart disease Mother   . Depression Mother   . Hypertension Mother   . Kidney disease Mother   .  Arthritis Mother   . Clotting disorder Mother   . Arthritis Father   . Alcohol abuse Brother   . Drug abuse Brother   . Sickle cell anemia Brother   . Arthritis Maternal Grandmother   . Heart disease Maternal Grandmother   . Arthritis Maternal Grandfather   . Heart disease Maternal Grandfather   . Asthma Cousin     Past Surgical History:  Procedure Laterality Date  . HERNIA REPAIR     6468 and 0321; umbilical hernia repair  . KNEE SURGERY Bilateral    Left knee 1993, right knee 2004  . TONSILLECTOMY    . TOTAL KNEE ARTHROPLASTY Left 02/10/2017   Procedure: LEFT TOTAL KNEE ARTHROPLASTY;  Surgeon: Leandrew Koyanagi, MD;  Location: [facility];  Service: Orthopedics;  Laterality: Left;  . TOTAL KNEE ARTHROPLASTY Right 07/28/2017   Procedure: RIGHT TOTAL KNEE ARTHROPLASTY;  Surgeon: Leandrew Koyanagi, MD;  Location: [place];  Service: Orthopedics;  Laterality: Right;   Social History   Occupational History  . Not on file  Tobacco Use  . Smoking status: Current Every Day Smoker    Packs/day: 0.25    Years: 35.00    Pack years: 8.75    Types: Cigarettes    Start date: 10/07/1976  . Smokeless tobacco: Never Used  . Tobacco comment: Stopped for up to 5 years total - Peak rate 2ppd  Substance and Sexual Activity  . Alcohol use: Yes    Alcohol/week: 12.0 standard drinks    Types: 12 Cans of beer per week    Comment: seldom wine/liquor  . Drug use: Yes    Types: Marijuana, Cocaine    Comment: Cocaine last used in 2004, current marijuana use  . Sexual activity: Yes    Birth control/protection: None    Comment: With wife

## 2018-07-19 ENCOUNTER — Telehealth: Payer: Self-pay

## 2018-07-19 NOTE — Telephone Encounter (Signed)
Left message for pt to call back about switching upcoming appt to a virtual visit.

## 2018-07-20 DIAGNOSIS — E785 Hyperlipidemia, unspecified: Secondary | ICD-10-CM | POA: Insufficient documentation

## 2018-07-20 NOTE — Progress Notes (Signed)
Virtual Visit via Video Note   This visit type was conducted due to national recommendations for restrictions regarding the COVID-19 Pandemic (e.g. social distancing) in an effort to limit this patient's exposure and mitigate transmission in our community.  Due to his co-morbid illnesses, this patient is at least at moderate risk for complications without adequate follow up.  This format is felt to be most appropriate for this patient at this time.  The patient did not have access to video technology/had technical difficulties with video requiring transitioning to audio format only (telephone).  All issues noted in this document were discussed and addressed.  No physical exam could be performed with this format.  Please refer to the patient's chart for his  consent to telehealth for [facility].  Evaluation Performed:  Follow-up visit  This visit type was conducted due to national recommendations for restrictions regarding the COVID-19 Pandemic (e.g. social distancing).  This format is felt to be most appropriate for this patient at this time.  All issues noted in this document were discussed and addressed.  No physical exam was performed (except for noted visual exam findings with Video Visits).  Please refer to the patient's chart (MyChart message for video visits and phone note for telephone visits) for the patient's consent to telehealth for [facility].  Date:  07/21/2018   ID:  Jeffrey [name], DOB [birth year], MRN [id number]  Patient Location:  Home  Provider location:   [place]  PCP:  Caroline More, DO  Cardiologist:  Fransico Him, MD  Electrophysiologist:  None   Chief Complaint:  Nonobstructive CAD, HTN, bilateral PEs  History of Present Illness:    Jeffrey [name] is a 53 y.o. male who presents via audio/video conferencing for a telehealth visit today.    Jeffrey [name] is a 53yo male with a has history of recurrent DVT on Xarelto with subsequent massive bilateral  PEs with right heart strain 05/2016 after missing several doses of Xarelto.(now on eliquis). His Initial EKG at that time was worrisome for STEMI but repeat EKG strain felt secondary to PE. 2Decho showed RV strain and mild RV dysfunction. Repeat echo showed normal LVEF with moderate LVH and mild LAE. Coronary CTA showed high calcium score with mild nonobstructive CAD and aggressive risk factor modification was recommended. He has severe hepatic steatosis and is followed by GI.  He is here today for followup and is doing well.  He denies any chest pain or pressure, SOB, DOE, PND, orthopnea,  dizziness, palpitations or syncope.  He occasionally has some lower extremity edema if he has been on his feet a long period of time.  He is compliant with his meds and is tolerating meds with no SE.   He continues to smoke but is actually cut down to 4 cigarettes a day.  He had a knee replacement since I saw him last and he is now able to exercise more.  He had no guard on his own record  The patient does not have symptoms concerning for COVID-19 infection (fever, chills, cough, or new shortness of breath).    Prior CV studies:   The following studies were reviewed today:    Past Medical History:  Diagnosis Date  . Acute pulmonary embolus (HCC)    bilateral submassive PE in setting of missing several doses of Xarelto for his DVT  . Arthritis   . DVT (deep venous thrombosis) ([place]) 11/28/2013   RT LEG  . High cholesterol   . Hypertension   .  Marijuana use    Past Surgical History:  Procedure Laterality Date  . HERNIA REPAIR     9147 and 8295; umbilical hernia repair  . KNEE SURGERY Bilateral    Left knee 1993, right knee 2004  . TONSILLECTOMY    . TOTAL KNEE ARTHROPLASTY Left 02/10/2017   Procedure: LEFT TOTAL KNEE ARTHROPLASTY;  Surgeon: Leandrew Koyanagi, MD;  Location: [place];  Service: Orthopedics;  Laterality: Left;  . TOTAL KNEE ARTHROPLASTY Right 07/28/2017   Procedure: RIGHT TOTAL KNEE ARTHROPLASTY;   Surgeon: Leandrew Koyanagi, MD;  Location: [place];  Service: Orthopedics;  Laterality: Right;     Current Meds  Medication Sig  . amLODipine (NORVASC) 10 MG tablet Take 1 tablet (10 mg total) by mouth daily.  Marland Kitchen atorvastatin (LIPITOR) 40 MG tablet TAKE 1 TABLET BY MOUTH DAILY.  Marland Kitchen ELIQUIS 5 MG TABS tablet TAKE 1 TABLET BY MOUTH 2 TIMES DAILY.  Marland Kitchen lisinopril (PRINIVIL,ZESTRIL) 10 MG tablet Take 1 tablet (10 mg total) by mouth daily.     Allergies:   Patient has no known allergies.   Social History   Tobacco Use  . Smoking status: Current Every Day Smoker    Packs/day: 0.25    Years: 35.00    Pack years: 8.75    Types: Cigarettes    Start date: 10/07/1976  . Smokeless tobacco: Never Used  . Tobacco comment: Stopped for up to 5 years total - Peak rate 2ppd  Substance Use Topics  . Alcohol use: Yes    Alcohol/week: 12.0 standard drinks    Types: 12 Cans of beer per week    Comment: seldom wine/liquor  . Drug use: Yes    Types: Marijuana, Cocaine    Comment: Cocaine last used in 2004, current marijuana use     Family Hx: The patient's family history includes Alcohol abuse in his brother and mother; Arthritis in his father, maternal grandfather, maternal grandmother, mother, and another family member; Asthma in his cousin; Clotting disorder in his mother; Depression in his mother; Drug abuse in his brother; Heart disease in his maternal grandfather, maternal grandmother, mother, and another family member; Hypertension in his mother; Kidney disease in his mother; Sickle cell anemia in his brother.  ROS:   Please see the history of present illness.     All other systems reviewed and are negative.   Labs/Other Tests and Data Reviewed:    Recent Labs: 07/29/2017: Hemoglobin 13.0; Platelets 156 08/12/2017: BUN 8; Creatinine, Ser 0.86; Potassium 4.5; Sodium 133   Recent Lipid Panel Lab Results  Component Value Date/Time   CHOL 103 10/15/2016 09:13 AM   TRIG 67 10/15/2016 09:13 AM   HDL  50 10/15/2016 09:13 AM   CHOLHDL 2.1 10/15/2016 09:13 AM   CHOLHDL 3.5 05/04/2013 02:34 PM   LDLCALC 40 10/15/2016 09:13 AM    Wt Readings from Last 3 Encounters:  07/21/18 263 lb (119.3 kg)  02/16/18 278 lb 8 oz (126.3 kg)  08/12/17 263 lb 6.4 oz (119.5 kg)     Objective:    Vital Signs:  BP 133/87   Ht 6\' 3"  (1.905 m)   Wt 263 lb (119.3 kg)   BMI 32.87 kg/m    CONSTITUTIONAL:  Well nourished, well developed male in no acute distress.  EYES: anicteric MOUTH: oral mucosa is pink RESPIRATORY: Normal respiratory effort, symmetric expansion CARDIOVASCULAR: No peripheral edema SKIN: No rash, lesions or ulcers MUSCULOSKELETAL: no digital cyanosis NEURO: Cranial Nerves II-XII grossly intact, moves all extremities  PSYCH: Intact judgement and insight.  A&O x 3, Mood/affect appropriate   ASSESSMENT & PLAN:    1.  H/O bilateral submassive PEs - this occurred in the setting of missing several doses of Xarelto for DVT.  Complicated by acute RV strain which resolved on followup echo.  He will continue on Eliquis 5mg  BID.  He has not had any bleeding problems.  This is followed by his PCP.   2.  Abnormal EKG - this was in association with acute PE and RV strain.    3.  Coronary artery calcifications/Mild CAD on Chest CT - calcium score was high and coronary CTA showed mild nonobstructive CAD.  He has not had any anginal symptoms. He is not on ASA due to DOAC.  He will continue on satin.   4.  Hypertension - He checks his BP at home and it is controlled.  He will continue on Lisinopril 10mg  daily and amlodipine 10mg  daily.  I will get a bmet in July once the COVID 19 crisis has improved.  5.  Tobacco abuse - he has weaned down on his cigarettes to 4 to 5 cigarettes a day.  He is motivated to quit completely.   6.  Hyperlipidemia - his LDL goal is < 70.  He will continue on atorvastatin 40mg  daily.  His last LDL was 40 in 2018.  I will repeat an FLP and ALT in July due to the COVID 19  crisis.   7.  COVID-19 Education:The signs and symptoms of COVID-19 were discussed with the patient and how to seek care for testing (follow up with PCP or arrange E-visit).  The importance of social distancing was discussed today.  Patient Risk:   After full review of this patient's clinical status, I feel that they are at least moderate risk at this time.  Time:   Today, I have spent 15 minutes directly with the patient on video discussing medical problems including ongoing anticoagulation for PE/DVT, treatment of HNT, lipids and healthy diet and exercise and tobacco abuse counseling .  We also reviewed the symptoms of COVID 19 and the ways to protect against contracting the virus with telehealth technology.  I spent an additional 10 minutes reviewing patient's chart including prior notes, 2D echo, labs.  Medication Adjustments/Labs and Tests Ordered: Current medicines are reviewed at length with the patient today.  Concerns regarding medicines are outlined above.  Tests Ordered: No orders of the defined types were placed in this encounter.  Medication Changes: No orders of the defined types were placed in this encounter.   Disposition:  Follow up in 1 year(s)  Signed, Fransico Him, MD  07/21/2018 9:23 AM    [facility]

## 2018-07-20 NOTE — Telephone Encounter (Signed)
Spoke with pt and he has given consent to have a phone visit with Dr. Radford Pax. Pt has been advised to have his BP, HR, and weight ready for his appt.   YOUR CARDIOLOGY TEAM HAS ARRANGED FOR AN E-VISIT FOR YOUR APPOINTMENT - PLEASE REVIEW IMPORTANT INFORMATION BELOW SEVERAL DAYS PRIOR TO YOUR APPOINTMENT  Due to the recent COVID-19 pandemic, we are transitioning in-person office visits to tele-medicine visits in an effort to decrease unnecessary exposure to our patients and staff. Medicare and most insurances are covering these visits without a copay needed. You will need a working email and a smartphone or computer with a camera and microphone. For patients that do not have these items, we can still complete the visit using a telephone but do prefer video when possible. If possible, we also ask that you have a blood pressure cuff and scale at home to measure your blood pressure, heart rate and weight prior to your scheduled appointment. Patients with clinical needs that need an in-person evaluation and testing will still be able to come to the office if absolutely necessary. If you have any questions, feel free to call our office.     DOWNLOADING THE SOFTWARE  Download the [employer] app to enable video and telephone visits with your [facility] Provider.   Instructions for downloading [employer] WebEx: - Go to [email or web address].[email or web address] and follow the instructions, or download the app on your smartphone [facility] [employer] Meetings). - If you have technical difficulties with downloading WebEx, please call WebEx at [phone]. - Once the app is downloaded (can be done on either mobile or desktop computer), go to Settings in the upper left hand corner.  Be sure that camera and audio are enabled.  - You will receive an email message with a link to the meeting with a time to join for your tele-health visit.  - Please download the app and have settings configured prior to the appointment time.       2-3 DAYS BEFORE YOUR APPOINTMENT  One of our staff will call you to confirm that you have been able to set up your WebEx account. We will remind you check your blood pressure, heart rate and weight prior to your scheduled appointment. If you have an Apple Watch or Kardia, please upload any pertinent ECG strips the day before or morning of your appointment to [place]. Our staff will also make sure you have reviewed the consent and agree to move forward with your scheduled tele-health visit.    THE DAY OF YOUR APPOINTMENT  Approximately 15-20 minutes prior to your scheduled appointment, you will receive an e-mail directly from one of our staff member's [email or web address] e-mail accounts inviting you to join a WebEx meeting.  Please do not reply to that email - simply join the [employer].  Upon joining, a member of the office staff will speak with you initially through the WebEx platform to confirm medications, vital signs for the day and any symptoms you may be experiencing.  Please have this information available prior to the time of visit start.      CONSENT FOR TELE-HEALTH VISIT - PLEASE RVIEW  I hereby voluntarily request, consent and authorize [facility] and its employed or contracted physicians, physician assistants, nurse practitioners or other licensed health care professionals (the Practitioner), to provide me with telemedicine health care services (the "Services") as deemed necessary by the treating Practitioner. I acknowledge and consent to receive the Services by the Practitioner via telemedicine. I understand that the  telemedicine visit will involve communicating with the Practitioner through live audiovisual communication technology and the disclosure of certain medical information by electronic transmission. I acknowledge that I have been given the opportunity to request an in-person assessment or other available alternative prior to the telemedicine visit and am voluntarily  participating in the telemedicine visit.  I understand that I have the right to withhold or withdraw my consent to the use of telemedicine in the course of my care at any time, without affecting my right to future care or treatment, and that the Practitioner or I may terminate the telemedicine visit at any time. I understand that I have the right to inspect all information obtained and/or recorded in the course of the telemedicine visit and may receive copies of available information for a reasonable fee.  I understand that some of the potential risks of receiving the Services via telemedicine include:  Marland Kitchen Delay or interruption in medical evaluation due to technological equipment failure or disruption; . Information transmitted may not be sufficient (e.g. poor resolution of images) to allow for appropriate medical decision making by the Practitioner; and/or  . In rare instances, security protocols could fail, causing a breach of personal health information.  Furthermore, I acknowledge that it is my responsibility to provide information about my medical history, conditions and care that is complete and accurate to the best of my ability. I acknowledge that Practitioner's advice, recommendations, and/or decision may be based on factors not within their control, such as incomplete or inaccurate data provided by me or distortions of diagnostic images or specimens that may result from electronic transmissions. I understand that the practice of medicine is not an exact science and that Practitioner makes no warranties or guarantees regarding treatment outcomes. I acknowledge that I will receive a copy of this consent concurrently upon execution via email to the email address I last provided but may also request a printed copy by calling the office of [place].    I understand that my insurance will be billed for this visit.   I have read or had this consent read to me. . I understand the contents of this  consent, which adequately explains the benefits and risks of the Services being provided via telemedicine.  . I have been provided ample opportunity to ask questions regarding this consent and the Services and have had my questions answered to my satisfaction. . I give my informed consent for the services to be provided through the use of telemedicine in my medical care  By participating in this telemedicine visit I agree to the above.

## 2018-07-21 ENCOUNTER — Encounter: Payer: Self-pay | Admitting: Cardiology

## 2018-07-21 ENCOUNTER — Telehealth (INDEPENDENT_AMBULATORY_CARE_PROVIDER_SITE_OTHER): Payer: 59 | Admitting: Cardiology

## 2018-07-21 ENCOUNTER — Other Ambulatory Visit: Payer: Self-pay

## 2018-07-21 VITALS — BP 133/87 | Ht 75.0 in | Wt 263.0 lb

## 2018-07-21 DIAGNOSIS — Z720 Tobacco use: Secondary | ICD-10-CM | POA: Diagnosis not present

## 2018-07-21 DIAGNOSIS — I2782 Chronic pulmonary embolism: Secondary | ICD-10-CM | POA: Diagnosis not present

## 2018-07-21 DIAGNOSIS — E785 Hyperlipidemia, unspecified: Secondary | ICD-10-CM

## 2018-07-21 DIAGNOSIS — Z86711 Personal history of pulmonary embolism: Secondary | ICD-10-CM

## 2018-07-21 DIAGNOSIS — I2510 Atherosclerotic heart disease of native coronary artery without angina pectoris: Secondary | ICD-10-CM | POA: Diagnosis not present

## 2018-07-21 DIAGNOSIS — Z7189 Other specified counseling: Secondary | ICD-10-CM

## 2018-07-21 DIAGNOSIS — I2609 Other pulmonary embolism with acute cor pulmonale: Secondary | ICD-10-CM

## 2018-07-21 DIAGNOSIS — I10 Essential (primary) hypertension: Secondary | ICD-10-CM | POA: Diagnosis not present

## 2018-07-21 DIAGNOSIS — R9431 Abnormal electrocardiogram [ECG] [EKG]: Secondary | ICD-10-CM

## 2018-07-21 NOTE — Patient Instructions (Signed)
Medication Instructions:  Your physician recommends that you continue on your current medications as directed. Please refer to the Current Medication list given to you today.  If you need a refill on your cardiac medications before your next appointment, please call your pharmacy.   Lab work: Fasting Labs: 10/10/18: BMET, Lipid and Liver  If you have labs (blood work) drawn today and your tests are completely normal, you will receive your results only by: Marland Kitchen MyChart Message (if you have MyChart) OR . A paper copy in the mail If you have any lab test that is abnormal or we need to change your treatment, we will call you to review the results.  Testing/Procedures: None  Follow-Up: At [facility], you and your health needs are our priority.  As part of our continuing mission to provide you with exceptional heart care, we have created designated Provider Care Teams.  These Care Teams include your primary Cardiologist (physician) and Advanced Practice Providers (APPs -  Physician Assistants and Nurse Practitioners) who all work together to provide you with the care you need, when you need it. You will need a follow up appointment in 1 years.  Please call our office 2 months in advance to schedule this appointment.  You may see Fransico Him, MD or one of the following Advanced Practice Providers on your designated Care Team:   [place], PA-C Melina Copa, PA-C . Ermalinda Barrios, PA-C

## 2018-07-26 ENCOUNTER — Ambulatory Visit (INDEPENDENT_AMBULATORY_CARE_PROVIDER_SITE_OTHER): Payer: 59 | Admitting: Orthopaedic Surgery

## 2018-08-19 ENCOUNTER — Other Ambulatory Visit: Payer: Self-pay | Admitting: Family Medicine

## 2018-08-19 MED FILL — ELIQUIS 5 MG TABLET: 5 | 30 days supply | Qty: 60 | Fill #3

## 2018-08-19 MED FILL — ATORVASTATIN 40 MG TABLET: 40 | 30 days supply | Qty: 30 | Fill #0

## 2018-08-19 MED FILL — AMLODIPINE BESYLATE 10 MG T: 10 | 90 days supply | Qty: 90 | Fill #1

## 2018-10-06 IMAGING — CT CT HEART MORP W/ CTA COR W/ SCORE W/ CA W/CM &/OR W/O CM
4 of 7 series · 8 of 20 positions shown, 9 images · non-contrast
Comparison: Chest CTA 06/03/2016.

CLINICAL DATA: 51-year-old male with atypical chest pain and h/o
pulmonary embolism.

EXAM:
Cardiac/Coronary  CT
TECHNIQUE: The patient was scanned on a Philips 256 scanner.

[Series 7: best diast 72 % · axial · 0.40mm/px · z∈[-297,-240]mm · 2 of 432 slices shown, 3 images]
[im 144/432  vessel]
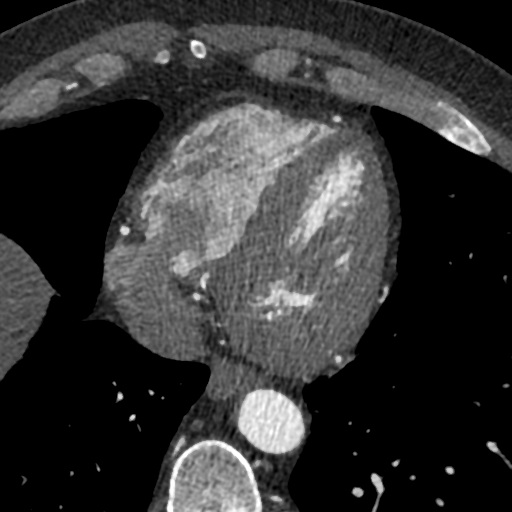
[im 144/432  lung]
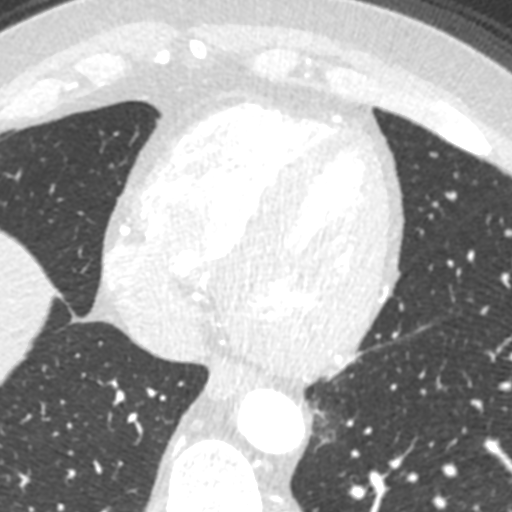
[im 288/432  vessel]
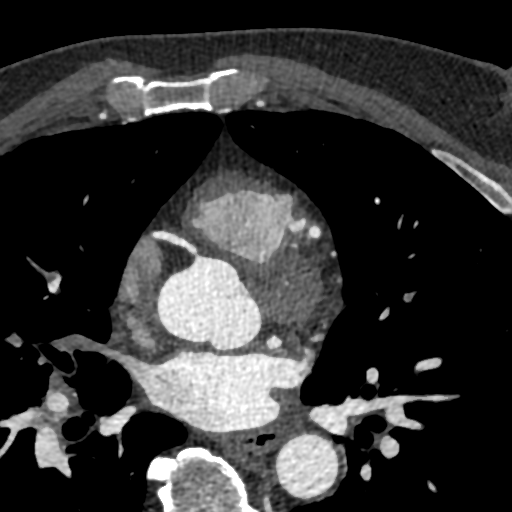

[Series 8: best syst 43 % · axial · 0.40mm/px · z∈[-297,-240]mm · 2 of 432 slices shown]
[im 144/432  vessel]
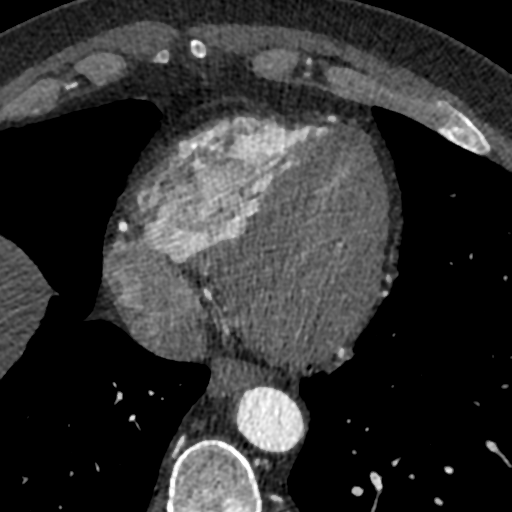
[im 288/432  vessel]
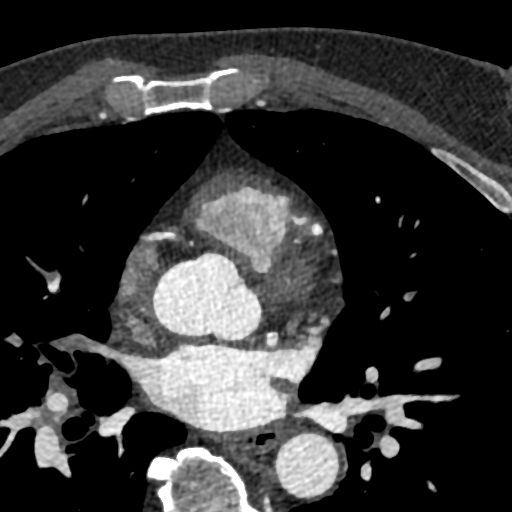

[Series 9: ts diast sharp 72 % · axial · 0.40mm/px · z∈[-297,-240]mm · 2 of 432 slices shown]
[im 144/432  lung]
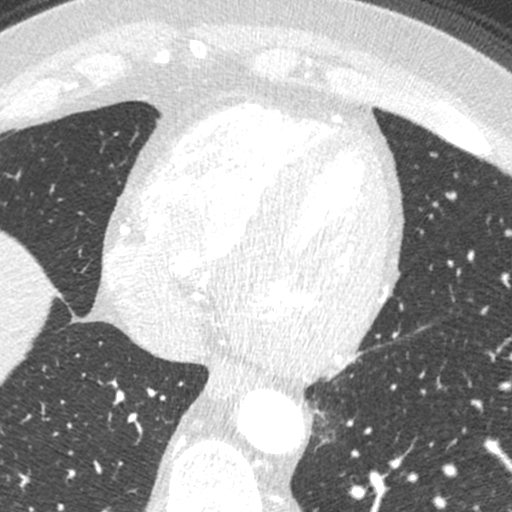
[im 288/432  lung]
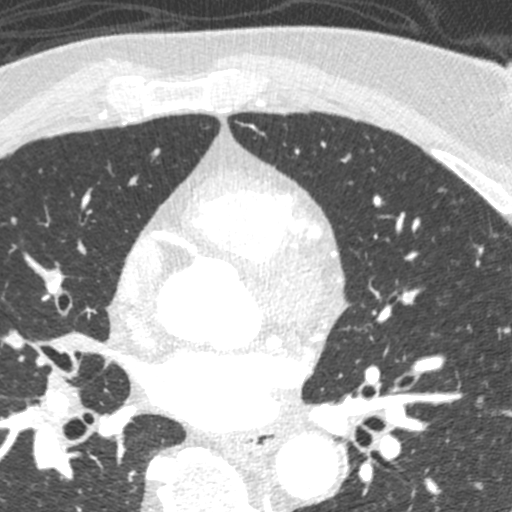

[Series 10: ts syst sharp 43 % · axial · 0.40mm/px · z∈[-297,-240]mm · 2 of 432 slices shown]
[im 144/432  lung]
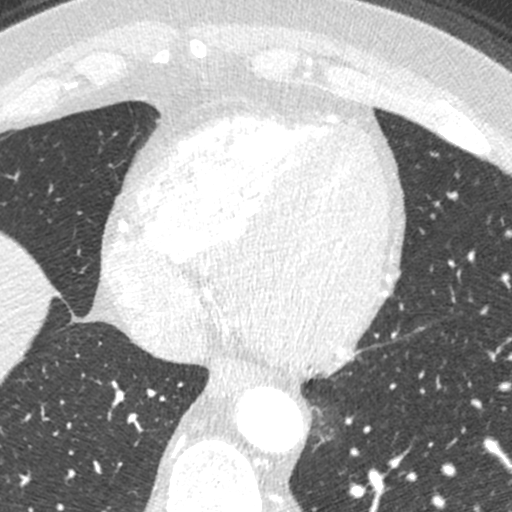
[im 288/432  lung]
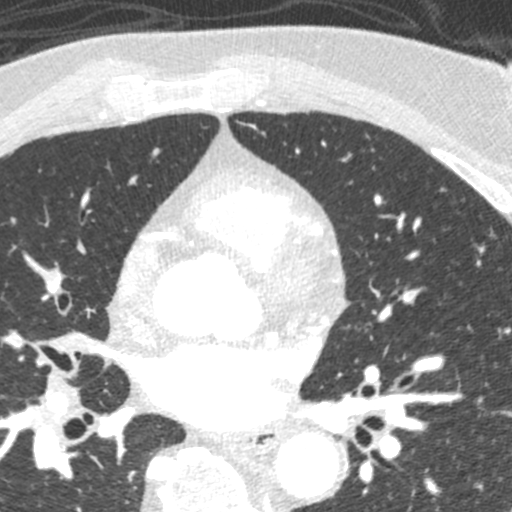

[8 of 20 positions shown; findings below may reference images not displayed]

FINDINGS: A 100 kV prospective scan was triggered in the descending thoracic
aorta at 111 HU's. Axial non-contrast 3 mm slices were carried out
through the heart. The data set was analyzed on a dedicated work
station and scored using the Agatson method. 15 mg of iv metoprolol
and 0.8 mg of sl NTG was given. The 3D data set was reconstructed in
5% intervals of the 30-80% of the R-R cycle. Diastolic phases were
analyzed on a dedicated work station using MPR, MIP and VRT modes.
The patient received 80 cc of contrast.

Aorta:  Normal size.  No calcifications.  No dissection.

Aortic Valve:  Trileaflet.  No calcifications.

Coronary Arteries:  Normal coronary origin.  Right dominance.

RCA is a large dominant artery that gives rise to PDA and PLVB.
There is minimal diffuse calcified plaque in the proximal and mid
RCA associated with 0-25% stenosis.

Left main is a large caliber short artery that gives rise to LAD and
LCX arteries. There is no plaque.

LAD is a large vessel that gives rise to two diagonal branches.
There is minimal diffuse calcified plaque in the proximal segment
associated with 0-25% stenosis.

Diagonal branches have no significant plaque.

LCX is a medium caliber non-dominant artery that gives rise to three
OM branches. There is mild mixed, predominantly calcified plaque in
the proximal segment associated with 25-50% stenosis.

OM1 is large caliber vessel and has no plaque.

OM2 is very small.

OM3 is medium size and has no plaque.

Other findings:

Normal pulmonary vein drainage into the left atrium.

Normal let atrial appendage without a thrombus.

Dilated pulmonary artery measuring 35 mm consistent with pulmonary
hypertension.
IMPRESSION: 1. High coronary calcium score of 392. This was 98 percentile for
age and sex matched control.

2. Normal coronary origin with right dominance.

3. Mild non-obstructive CAD. Aggressive risk factor modification is
recommended.

4. Dilated pulmonary artery measuring 35 mm consistent with
pulmonary hypertension.

Zapata Vidales

EXAM:
OVER-READ INTERPRETATION  CT CHEST

The following report is an over-read performed by radiologist Dr.
over-read does not include interpretation of cardiac or coronary
anatomy or pathology. The coronary calcium score/coronary CTA
interpretation by the cardiologist is attached.
FINDINGS: There continues to be multiple filling defects within the pulmonary
arterial tree involving distal main pulmonary arteries extending
into lobar, segmental and subsegmental sized branches bilaterally,
however, the overall burden of embolism has significantly decreased
compared to prior study 06/03/2016. This clot appears to be
nonocclusive on today's study. Within the visualized portions of the
thorax there is no acute consolidative airspace disease to suggest
hemorrhage from pulmonary infarction. No suspicious-appearing
pulmonary nodules or masses, no pleural effusion, no pneumothorax
and no lymphadenopathy is noted. Visualized portions of the upper
abdomen demonstrate diffuse low attenuation throughout the hepatic
parenchyma, indicative of severe hepatic steatosis. There are no
aggressive appearing lytic or blastic lesions noted in the
visualized portions of the skeleton.
IMPRESSION: 1. Decreasing burden of bilateral pulmonary emboli when compared to
prior study 06/03/2016. No finding acute pulmonary infarction at
this time.
2. Severe hepatic steatosis.

## 2018-10-10 ENCOUNTER — Other Ambulatory Visit: Payer: 59

## 2018-10-20 ENCOUNTER — Other Ambulatory Visit: Payer: Self-pay | Admitting: Family Medicine

## 2018-10-20 MED FILL — ATORVASTATIN 40 MG TABLET: 40 | 30 days supply | Qty: 30 | Fill #1

## 2018-10-20 MED FILL — ELIQUIS 5 MG TABLET: 5 | 30 days supply | Qty: 60 | Fill #0

## 2019-02-16 ENCOUNTER — Other Ambulatory Visit: Payer: Self-pay | Admitting: Physician Assistant

## 2019-02-16 MED FILL — ELIQUIS 5 MG TABLET: 5 | 30 days supply | Qty: 60 | Fill #1

## 2019-02-16 MED FILL — AMLODIPINE BESYLATE 10 MG T: 10 | 90 days supply | Qty: 90 | Fill #2

## 2019-02-16 MED FILL — ATORVASTATIN 40 MG TABLET: 40 | 30 days supply | Qty: 30 | Fill #2

## 2019-02-17 MED FILL — LISINOPRIL 10 MG TABS: 10 | 90 days supply | Qty: 90 | Fill #0

## 2019-05-03 ENCOUNTER — Other Ambulatory Visit: Payer: Self-pay | Admitting: Family Medicine

## 2019-05-03 DIAGNOSIS — I10 Essential (primary) hypertension: Secondary | ICD-10-CM

## 2019-05-03 MED FILL — AMLODIPINE BESYLATE 10 MG T: 10 | 90 days supply | Qty: 90 | Fill #0

## 2019-05-03 MED FILL — ATORVASTATIN 40 MG TABLET: 40 | 30 days supply | Qty: 30 | Fill #0

## 2019-05-03 MED FILL — ELIQUIS 5 MG TABLET: 5 | 30 days supply | Qty: 60 | Fill #2

## 2019-05-17 ENCOUNTER — Other Ambulatory Visit: Payer: Self-pay

## 2019-05-17 ENCOUNTER — Ambulatory Visit (INDEPENDENT_AMBULATORY_CARE_PROVIDER_SITE_OTHER): Payer: 59 | Admitting: Family Medicine

## 2019-05-17 ENCOUNTER — Ambulatory Visit: Payer: 59

## 2019-05-17 VITALS — BP 140/70 | HR 112 | Wt 269.8 lb

## 2019-05-17 DIAGNOSIS — M48062 Spinal stenosis, lumbar region with neurogenic claudication: Secondary | ICD-10-CM

## 2019-05-17 DIAGNOSIS — G9519 Other vascular myelopathies: Secondary | ICD-10-CM

## 2019-05-17 MED ORDER — GABAPENTIN 300 MG PO CAPS: 300.0000 mg | ORAL_CAPSULE | Freq: Three times a day (TID) | ORAL | 0 refills | Status: DC

## 2019-05-17 MED ORDER — PREDNISONE 20 MG PO TABS: 40.0000 mg | ORAL_TABLET | Freq: Every day | ORAL | 0 refills | Status: DC

## 2019-05-17 MED FILL — GABAPENTIN 300 MG CAPSULE: 300 | 30 days supply | Qty: 90 | Fill #0

## 2019-05-17 MED FILL — predniSONE 20 MG TABS: 20 | 7 days supply | Qty: 14 | Fill #0

## 2019-05-17 NOTE — Patient Instructions (Signed)
It was great seeing you today!  I am sorry that you have had back pain.  Your symptoms sound very suspicious forneurogenic claudication.  We reviewed the anatomy and the symptoms of that today.  Normally we would do anti-inflammatories for this but unfortunately due to the Eliquis we can do those.  I will send in prednisone 40 mg daily for 7 days prescription.  I will also send in gabapentin 300 mg which you can take every 8 hours.  Lastly we will probably need an MRI to better diagnose this.  Insurance will not cover that until I get x-rays.  I placed order for x-rays, you can just walk-in to [facility] imaging get those done anytime.

## 2019-05-19 ENCOUNTER — Encounter: Payer: Self-pay | Admitting: Family Medicine

## 2019-05-19 DIAGNOSIS — M48062 Spinal stenosis, lumbar region with neurogenic claudication: Secondary | ICD-10-CM | POA: Insufficient documentation

## 2019-05-19 DIAGNOSIS — G9519 Other vascular myelopathies: Secondary | ICD-10-CM | POA: Insufficient documentation

## 2019-05-19 NOTE — Assessment & Plan Note (Signed)
Patient with essentially textbook symptoms of neurogenic claudication. Likely will need mri to diagnose. He is interested in surgery or perhaps spinal injection if this is the case. Will start workup with lumbar spine xrays. Pending results will likely need to undergo prior auth process for MRI. Patient is in a tough spot as he has a contraindictation to nsaids 2/2 to his eliquis use. He shares a substance abuse history with opiates and would prefer to not start those. Will try gabapentin 300mg  tid and prednisone 40mg  daily for 1 week and see if he gets some relief from this. Will follow up xrays for next steps in management.

## 2019-05-19 NOTE — Progress Notes (Signed)
   CHIEF COMPLAINT / HPI: 54 year old male who presents for lower back pain. The patient states that the back pain has been present for months. He states that the pain has become progressively worse and is now a 10/10. It has caused significant limitation to his ADLs and has been preventing him from sleeping at night. He states that the pain is a sharp, aching pain in his lower back. Of note he states that leaning forward relieves his pain. If he goes to the grocery store he has to lean over the shopping cart for support, which does greatly improve his pain. He has been taking "a lot" of ibuprofen to help with the pain along with tylenol.  He denies any alarm symptoms such as fecal or urinary incontinence, neurological changes, lower extremity weakness.  PERTINENT  PMH / PSH: lower back pain   OBJECTIVE: BP 140/70   Pulse (!) 112   Wt 269 lb 12.8 oz (122.4 kg)   SpO2 98%   BMI 33.72 kg/m   Gen: very pleasant 54 year old AA male, no acute distress Neuro: eomi, perrl. Cn 2-12 intact. NO focal neuro deficits. Hunched over gait. Back: no palpable tenderness to back. Extending back exacerbates pain, Flexing of back relieves pain.  BLE: Sensation intact down entirety of legs. 5/5 strength to leg flexion, extension CV: tachycardia, no m/r/g Resp: lungs ctab  ASSESSMENT / PLAN:  Neurogenic claudication Patient with essentially textbook symptoms of neurogenic claudication. Likely will need mri to diagnose. He is interested in surgery or perhaps spinal injection if this is the case. Will start workup with lumbar spine xrays. Pending results will likely need to undergo prior auth process for MRI. Patient is in a tough spot as he has a contraindictation to nsaids 2/2 to his eliquis use. He shares a substance abuse history with opiates and would prefer to not start those. Will try gabapentin 300mg  tid and prednisone 40mg  daily for 1 week and see if he gets some relief from this. Will follow up xrays for  next steps in management.   Guadalupe Dawn MD PGY-3 Family Medicine Resident [place]

## 2019-06-02 ENCOUNTER — Ambulatory Visit
Admission: RE | Admit: 2019-06-02 | Discharge: 2019-06-02 | Disposition: A | Discharge status: Home / Self Care | Payer: 59 | Source: Ambulatory Visit | Attending: Family Medicine | Admitting: Family Medicine

## 2019-06-02 DIAGNOSIS — G9519 Other vascular myelopathies: Secondary | ICD-10-CM

## 2019-06-02 DIAGNOSIS — M48062 Spinal stenosis, lumbar region with neurogenic claudication: Secondary | ICD-10-CM

## 2019-06-02 DIAGNOSIS — M545 Low back pain: Secondary | ICD-10-CM | POA: Diagnosis not present

## 2019-07-09 NOTE — Progress Notes (Deleted)
    SUBJECTIVE:   CHIEF COMPLAINT / HPI:   *** Seen by Dr. Kris Mouton on 2/17 for neurogenic claudication. Recommended for MRI but Xray initially. Xray showing "No acute abnormality. No significant change in the appearance of the lumbar spine since the prior study. Progressive severe arthritic changes of both hips.". Given NSAIDs and prednisone.   Hypertension: - Medications: norvasc 10mg , lisinopril 10mg  - Compliance: *** - Checking BP at home: *** - Denies any SOB, CP, vision changes, LE edema, medication SEs, or symptoms of hypotension - Diet: *** - Exercise: ***   PERTINENT  PMH / PSH: ***  OBJECTIVE:   There were no vitals taken for this visit.  ***  ASSESSMENT/PLAN:   No problem-specific Assessment & Plan notes found for this encounter.     Caroline More, [place]

## 2019-07-10 ENCOUNTER — Ambulatory Visit: Payer: 59 | Admitting: Family Medicine

## 2019-07-11 ENCOUNTER — Ambulatory Visit (INDEPENDENT_AMBULATORY_CARE_PROVIDER_SITE_OTHER): Payer: 59 | Admitting: Family Medicine

## 2019-07-11 ENCOUNTER — Encounter: Payer: Self-pay | Admitting: Orthopaedic Surgery

## 2019-07-11 ENCOUNTER — Ambulatory Visit (INDEPENDENT_AMBULATORY_CARE_PROVIDER_SITE_OTHER): Payer: 59

## 2019-07-11 ENCOUNTER — Other Ambulatory Visit: Payer: Self-pay

## 2019-07-11 ENCOUNTER — Ambulatory Visit: Payer: 59 | Admitting: Orthopaedic Surgery

## 2019-07-11 ENCOUNTER — Ambulatory Visit: Payer: Self-pay | Admitting: Orthopaedic Surgery

## 2019-07-11 ENCOUNTER — Encounter: Payer: Self-pay | Admitting: Family Medicine

## 2019-07-11 ENCOUNTER — Ambulatory Visit: Payer: Self-pay

## 2019-07-11 VITALS — BP 135/70 | HR 80 | Ht 76.0 in | Wt 266.4 lb

## 2019-07-11 DIAGNOSIS — Z96651 Presence of right artificial knee joint: Secondary | ICD-10-CM | POA: Diagnosis not present

## 2019-07-11 DIAGNOSIS — M48062 Spinal stenosis, lumbar region with neurogenic claudication: Secondary | ICD-10-CM

## 2019-07-11 DIAGNOSIS — G9519 Other vascular myelopathies: Secondary | ICD-10-CM

## 2019-07-11 DIAGNOSIS — M545 Low back pain, unspecified: Secondary | ICD-10-CM

## 2019-07-11 DIAGNOSIS — Z96652 Presence of left artificial knee joint: Secondary | ICD-10-CM | POA: Diagnosis not present

## 2019-07-11 MED ORDER — AMOXICILLIN 500 MG PO CAPS: 2000.0000 mg | ORAL_CAPSULE | Freq: Once | ORAL | 6 refills | Status: AC

## 2019-07-11 MED FILL — AMOXICILLIN 500 MG CAPSULE: 500 | 1 days supply | Qty: 4 | Fill #0

## 2019-07-11 NOTE — Progress Notes (Signed)
Subjective:   [name]    DOB: 08-01-1965, 54 y.o. male   MRN: [id number]  Jeffrey [name] is a 54 y.o. male with a history of osteoarthritis and bilateral total knee replacement, chronic pain here to discuss his lumbar x-ray from February.  Discuss X-ray Results  Low Back Pain: Patient was seen in February 2021 for progressively worsening low back pain that was significantly limiting his ADLs.  Described the pain as sharp, aching pain in his low back that improved with leaning forward.  He was treating the pain with Tylenol and ibuprofen.  Denied any urinary incontinence, neurological changes, lower extremity weakness.  Received lumbar x-ray at that time which showed no acute abnormality, chronic and unchanged left lateral osteophytes fuse the L1-2 and L2-3 levels. Slight bilateral facet hypertrophy at L3-4.  She has bilateral knee replacements follow closely with Dr. Erlinda Hong at [facility]. He notes the pain is about the same but has gotten to the point he can only stand for 10-15 minutes before it pain gets severe. Patient has been unable to work due to bilateral knee replacements and severe back pain. He is requesting a work note to give to his lawyer to explain why he is unable to work or pay child support. He is taking Gabapentin 300mg  TID which is helping some but feels like he is masking the symptoms. He has a history of narcotic abuse and refuses narcotics. Patient is on Eliquis for history of history of DVT and PE.Marland Kitchen   Review of Systems:  Per HPI.   Objective:   BP 135/70   Pulse 80   Ht 6\' 4"  (1.93 m)   Wt 266 lb 6.4 oz (120.8 kg)   SpO2 98%   BMI 32.43 kg/m  Vitals and nursing note reviewed.  General: pleasant older obese male, well nourished, well developed, in no acute distress with non-toxic appearance CV: regular rate and rhythm without murmurs, rubs, or gallops Lungs: clear to auscultation bilaterally with normal work of breathing Skin: warm, dry  Extremities: warm and well perfused MSK: see below  Neuro: Alert and oriented, speech normal  Lumbar spine: - Inspection: no gross deformity or asymmetry, swelling or ecchymosis - Palpation: Tender to palpation over spinous processes and paraspinal muscles of lumbar spine, nontender over SI joints b/l - ROM: normal flex, very limited extension with severe pain, normal sidebending/rotation b/l however painful - Strength: 5/5 strength of lower extremity, antalgic gait uses cane at baseline - Neuro: sensation intact in the L4-S1 nerve root distribution b/l  Assessment & Plan:   Lumbar back pain Chronic, slowly worsening, uncertain prognosis. Lumbar x-ray notable for osteophytes with fusion of L1-2/L2-3 and facet hypertrophy. Has a history of severe osteoarthritis in hips and knees s/p bilateral knee replacement. Will obtain MRI to further evaluate and manage pending results. Patient already well established with ortho and would likely benefit from follow up with them for further long term management given how debilitating pain is for patient. At this time recommend continuing OTC oral and topical analgesics, Gabapentin 300mg  TID. Work note provided.  - obtain MRI lumbar spine - continue OTC oral and topical analgesics, Gabapentin 300mg  TID - heating pad  Orders Placed This Encounter  Procedures  . MR Lumbar Spine Wo Contrast    Standing Status:   Future    Standing Expiration Date:   09/09/2020    Order Specific Question:   What is the patient's sedation requirement?    Answer:  No Sedation    Order Specific Question:   Does the patient have a pacemaker or implanted devices?    Answer:   No    Order Specific Question:   Preferred imaging location?    Answer:   [facility] (table limit-500 lbs)    Order Specific Question:   Radiology Contrast Protocol - do NOT remove file path    Answer:   [email or web address]    [name], DO PGY-2, [place]  Family Medicine 07/13/2019 11:29 AM

## 2019-07-11 NOTE — Progress Notes (Signed)
Office Visit Note   Patient: Jeffrey [name]           Date of Birth: 1965/12/11           MRN: [id number] Visit Date: 07/11/2019              Requested by: Caroline More, DO [address],  [place] [address] PCP: Caroline More, DO   Assessment & Plan: Visit Diagnoses:  1. Status post right knee replacement   2. Status post left knee replacement     Plan: Patient is doing well from his knee replacements.  He has no complaints.  Amoxicillin was sent in for his upcoming dental work.  Follow-up in a year with repeat 2 view x-rays of bilateral knees.  Follow-Up Instructions: Return in about 1 year (around 07/10/2020).   Orders:  Orders Placed This Encounter  Procedures  . XR Knee 1-2 Views Right  . XR Knee 1-2 Views Left   Meds ordered this encounter  Medications  . amoxicillin (AMOXIL) 500 MG capsule    Sig: Take 4 capsules (2,000 mg total) by mouth once for 1 dose.    Dispense:  4 capsule    Refill:  6      Procedures: No procedures performed   Clinical Data: No additional findings.   Subjective: Chief Complaint  Patient presents with  . Right Knee - Follow-up  . Left Knee - Follow-up      Jeffrey [name] is status post staged bilateral knee replacements in the last 2 years.  He is doing well ambulating with a cane.  Reports no pain.  He is happy with the outcome.   Review of Systems   Objective: Vital Signs: There were no vitals taken for this visit.  Physical Exam  Ortho Exam Knee exams show fully healed surgical scars.  Excellent range of motion. Specialty Comments:  No specialty comments available.  Imaging: XR Knee 1-2 Views Left  Result Date: 07/11/2019 Stable total knee replacement in good alignment.   XR Knee 1-2 Views Right  Result Date: 07/11/2019 Stable total knee replacement in good alignment     PMFS History: Patient Active Problem List   Diagnosis Date Noted  . Status post left knee replacement 07/11/2019  .  Neurogenic claudication 05/19/2019  . Hyperlipidemia LDL goal <70 07/20/2018  . Status post right knee replacement 07/28/2017  . Preoperative clearance 07/20/2017  . History of pulmonary embolus (PE) 07/20/2017  . Mild CAD 07/20/2017  . Pain in left ankle and joints of left foot 06/21/2017  . S/P TKR (total knee replacement), left 02/10/2017  . Chronic anticoagulation 01/26/2017  . Acute pulmonary embolus ([facility])   . Primary osteoarthritis of left knee 07/28/2016  . Chest pain   . Abnormal EKG   . Pulmonary embolism ([facility]) 06/03/2016  . Unilateral primary osteoarthritis, right knee 08/08/2015  . Encounter for chronic pain management 04/06/2014  . Tobacco abuse 12/05/2013  . DVT (deep venous thrombosis) ([facility]) 11/28/2013  . Candidate for statin therapy due to risk of future cardiovascular event 05/05/2013  . HTN (hypertension) 05/04/2013  . Bilateral knee pain 05/04/2013  . Lumbar back pain 05/04/2013  . Healthcare maintenance 05/04/2013  . BMI 33.0-33.9,adult 05/04/2013  . Hx of substance abuse ([facility]) 05/04/2013   Past Medical History:  Diagnosis Date  . Acute pulmonary embolus ([facility])    bilateral submassive PE in setting of missing several doses of Xarelto for his DVT  . Arthritis   . DVT (deep  venous thrombosis) ([facility]) 11/28/2013   RT LEG  . High cholesterol   . Hypertension   . Marijuana use     Family History  Problem Relation Age of Onset  . Heart disease Other   . Arthritis Other   . Alcohol abuse Mother   . Heart disease Mother   . Depression Mother   . Hypertension Mother   . Kidney disease Mother   . Arthritis Mother   . Clotting disorder Mother   . Arthritis Father   . Alcohol abuse Brother   . Drug abuse Brother   . Sickle cell anemia Brother   . Arthritis Maternal Grandmother   . Heart disease Maternal Grandmother   . Arthritis Maternal Grandfather   . Heart disease Maternal Grandfather   . Asthma Cousin     Past Surgical History:  Procedure Laterality  Date  . HERNIA REPAIR     [id number] and [id number]; umbilical hernia repair  . KNEE SURGERY Bilateral    Left knee 1993, right knee 2004  . TONSILLECTOMY    . TOTAL KNEE ARTHROPLASTY Left 02/10/2017   Procedure: LEFT TOTAL KNEE ARTHROPLASTY;  Surgeon: Leandrew Koyanagi, MD;  Location: [facility];  Service: Orthopedics;  Laterality: Left;  . TOTAL KNEE ARTHROPLASTY Right 07/28/2017   Procedure: RIGHT TOTAL KNEE ARTHROPLASTY;  Surgeon: Leandrew Koyanagi, MD;  Location: [facility];  Service: Orthopedics;  Laterality: Right;   Social History   Occupational History  . Not on file  Tobacco Use  . Smoking status: Current Every Day Smoker    Packs/day: 0.25    Years: 35.00    Pack years: 8.75    Types: Cigarettes    Start date: 10/07/1976  . Smokeless tobacco: Never Used  . Tobacco comment: Stopped for up to 5 years total - Peak rate 2ppd  Substance and Sexual Activity  . Alcohol use: Yes    Alcohol/week: 12.0 standard drinks    Types: 12 Cans of beer per week    Comment: seldom wine/liquor  . Drug use: Yes    Types: Marijuana, Cocaine    Comment: Cocaine last used in 2004, current marijuana use  . Sexual activity: Yes    Birth control/protection: None    Comment: With wife

## 2019-07-13 NOTE — Assessment & Plan Note (Addendum)
Chronic, slowly worsening, uncertain prognosis. Lumbar x-ray notable for osteophytes with fusion of L1-2/L2-3 and facet hypertrophy. Has a history of severe osteoarthritis in hips and knees s/p bilateral knee replacement. Will obtain MRI to further evaluate and manage pending results. Patient already well established with ortho and would likely benefit from follow up with them for further long term management given how debilitating pain is for patient. At this time recommend continuing OTC oral and topical analgesics, Gabapentin 300mg  TID. Work note provided.  - obtain MRI lumbar spine - continue OTC oral and topical analgesics, Gabapentin 300mg  TID - heating pad

## 2019-07-19 ENCOUNTER — Ambulatory Visit (HOSPITAL_COMMUNITY)
Admission: RE | Admit: 2019-07-19 | Discharge: 2019-07-19 | Disposition: A | Discharge status: Home / Self Care | Payer: 59 | Source: Ambulatory Visit | Attending: Family Medicine | Admitting: Family Medicine

## 2019-07-19 ENCOUNTER — Other Ambulatory Visit: Payer: Self-pay

## 2019-07-19 DIAGNOSIS — M545 Low back pain, unspecified: Secondary | ICD-10-CM

## 2019-07-20 MED FILL — ATORVASTATIN 40 MG TABLET: 40 | 30 days supply | Qty: 30 | Fill #1

## 2019-07-20 MED FILL — ELIQUIS 5 MG TABLET: 5 | 30 days supply | Qty: 60 | Fill #3

## 2019-07-25 ENCOUNTER — Ambulatory Visit (INDEPENDENT_AMBULATORY_CARE_PROVIDER_SITE_OTHER): Payer: 59 | Admitting: Orthopaedic Surgery

## 2019-07-25 ENCOUNTER — Encounter: Payer: Self-pay | Admitting: Orthopaedic Surgery

## 2019-07-25 ENCOUNTER — Other Ambulatory Visit: Payer: Self-pay

## 2019-07-25 DIAGNOSIS — M545 Low back pain, unspecified: Secondary | ICD-10-CM | POA: Insufficient documentation

## 2019-07-25 DIAGNOSIS — G8929 Other chronic pain: Secondary | ICD-10-CM

## 2019-07-25 NOTE — Progress Notes (Signed)
Office Visit Note   Patient: JOANDRI SORY           Date of Birth: April 29, 1965           MRN: [id number] Visit Date: 07/25/2019              Requested by: Caroline More, DO [address],  [place] [address] PCP: Caroline More, DO   Assessment & Plan: Visit Diagnoses:  1. Chronic bilateral low back pain without sciatica     Plan: MRI shows multilevel degenerative disc disease and spondylosis without central canal stenosis or foraminal stenosis.  These findings were reviewed with the patient in detail and based on discussion of treatment options patient would like to move forward with a lumbar spine ESI and physical therapy.  Follow-up as needed.  Follow-Up Instructions: Return if symptoms worsen or fail to improve.   Orders:  Orders Placed This Encounter  Procedures  . Ambulatory referral to Physical Therapy  . Ambulatory referral to Physical Medicine Rehab   No orders of the defined types were placed in this encounter.     Procedures: No procedures performed   Clinical Data: No additional findings.   Subjective: Chief Complaint  Patient presents with  . Lower Back - Pain    Khyron is a 54 year old gentleman here for L-spine MRI review.   Mainly he has chronic low back pain without true radiculopathy.   Review of Systems   Objective: Vital Signs: There were no vitals taken for this visit.  Physical Exam  Ortho Exam Exam is unchanged. Specialty Comments:  No specialty comments available.  Imaging: No results found.   PMFS History: Patient Active Problem List   Diagnosis Date Noted  . Chronic bilateral low back pain without sciatica 07/25/2019  . Status post left knee replacement 07/11/2019  . Neurogenic claudication 05/19/2019  . Hyperlipidemia LDL goal <70 07/20/2018  . Status post right knee replacement 07/28/2017  . Preoperative clearance 07/20/2017  . History of pulmonary embolus (PE) 07/20/2017  . Mild CAD 07/20/2017    . Pain in left ankle and joints of left foot 06/21/2017  . S/P TKR (total knee replacement), left 02/10/2017  . Chronic anticoagulation 01/26/2017  . Acute pulmonary embolus ([facility])   . Primary osteoarthritis of left knee 07/28/2016  . Chest pain   . Abnormal EKG   . Pulmonary embolism ([facility]) 06/03/2016  . Unilateral primary osteoarthritis, right knee 08/08/2015  . Encounter for chronic pain management 04/06/2014  . Tobacco abuse 12/05/2013  . DVT (deep venous thrombosis) ([facility]) 11/28/2013  . Candidate for statin therapy due to risk of future cardiovascular event 05/05/2013  . HTN (hypertension) 05/04/2013  . Bilateral knee pain 05/04/2013  . Lumbar back pain 05/04/2013  . Healthcare maintenance 05/04/2013  . BMI 33.0-33.9,adult 05/04/2013  . Hx of substance abuse ([facility]) 05/04/2013   Past Medical History:  Diagnosis Date  . Acute pulmonary embolus ([facility])    bilateral submassive PE in setting of missing several doses of Xarelto for his DVT  . Arthritis   . DVT (deep venous thrombosis) ([facility]) 11/28/2013   RT LEG  . High cholesterol   . Hypertension   . Marijuana use     Family History  Problem Relation Age of Onset  . Heart disease Other   . Arthritis Other   . Alcohol abuse Mother   . Heart disease Mother   . Depression Mother   . Hypertension Mother   . Kidney disease Mother   .  Arthritis Mother   . Clotting disorder Mother   . Arthritis Father   . Alcohol abuse Brother   . Drug abuse Brother   . Sickle cell anemia Brother   . Arthritis Maternal Grandmother   . Heart disease Maternal Grandmother   . Arthritis Maternal Grandfather   . Heart disease Maternal Grandfather   . Asthma Cousin     Past Surgical History:  Procedure Laterality Date  . HERNIA REPAIR     [id number] and [id number]; umbilical hernia repair  . KNEE SURGERY Bilateral    Left knee 1993, right knee 2004  . TONSILLECTOMY    . TOTAL KNEE ARTHROPLASTY Left 02/10/2017   Procedure: LEFT TOTAL KNEE ARTHROPLASTY;   Surgeon: Leandrew Koyanagi, MD;  Location: [facility];  Service: Orthopedics;  Laterality: Left;  . TOTAL KNEE ARTHROPLASTY Right 07/28/2017   Procedure: RIGHT TOTAL KNEE ARTHROPLASTY;  Surgeon: Leandrew Koyanagi, MD;  Location: [facility];  Service: Orthopedics;  Laterality: Right;   Social History   Occupational History  . Not on file  Tobacco Use  . Smoking status: Current Every Day Smoker    Packs/day: 0.25    Years: 35.00    Pack years: 8.75    Types: Cigarettes    Start date: 10/07/1976  . Smokeless tobacco: Never Used  . Tobacco comment: Stopped for up to 5 years total - Peak rate 2ppd  Substance and Sexual Activity  . Alcohol use: Yes    Alcohol/week: 12.0 standard drinks    Types: 12 Cans of beer per week    Comment: seldom wine/liquor  . Drug use: Yes    Types: Marijuana, Cocaine    Comment: Cocaine last used in 2004, current marijuana use  . Sexual activity: Yes    Birth control/protection: None    Comment: With wife

## 2019-07-28 ENCOUNTER — Encounter: Payer: Self-pay | Admitting: Physical Therapy

## 2019-07-28 ENCOUNTER — Other Ambulatory Visit: Payer: Self-pay

## 2019-07-28 ENCOUNTER — Ambulatory Visit (INDEPENDENT_AMBULATORY_CARE_PROVIDER_SITE_OTHER): Payer: 59 | Admitting: Physical Therapy

## 2019-07-28 DIAGNOSIS — R2689 Other abnormalities of gait and mobility: Secondary | ICD-10-CM | POA: Diagnosis not present

## 2019-07-28 DIAGNOSIS — G8929 Other chronic pain: Secondary | ICD-10-CM | POA: Diagnosis not present

## 2019-07-28 DIAGNOSIS — M25652 Stiffness of left hip, not elsewhere classified: Secondary | ICD-10-CM | POA: Diagnosis not present

## 2019-07-28 DIAGNOSIS — M545 Low back pain: Secondary | ICD-10-CM

## 2019-07-28 DIAGNOSIS — R296 Repeated falls: Secondary | ICD-10-CM

## 2019-07-28 DIAGNOSIS — M6281 Muscle weakness (generalized): Secondary | ICD-10-CM | POA: Diagnosis not present

## 2019-07-28 DIAGNOSIS — M25651 Stiffness of right hip, not elsewhere classified: Secondary | ICD-10-CM

## 2019-07-28 NOTE — Therapy (Signed)
[facility] [place], [place], [address] Phone: [phone]   Fax:  [phone]  Physical Therapy Evaluation  Patient Details  Name: Jeffrey [name] MRN: [id number] Date of Birth: 22-Oct-1965 Referring Provider (PT): Leandrew Koyanagi, MD   Encounter Date: 07/28/2019  PT End of Session - 07/28/19 1308    Visit Number  1    Number of Visits  8    Date for PT Re-Evaluation  09/22/19    Authorization Type  [facility] U[employer]    PT Start Time  1315    PT Stop Time  1400    PT Time Calculation (min)  45 min    Activity Tolerance  Patient tolerated treatment well    Behavior During Therapy  [facility] for tasks assessed/performed       Past Medical History:  Diagnosis Date  . Acute pulmonary embolus (HCC)    bilateral submassive PE in setting of missing several doses of Xarelto for his DVT  . Arthritis   . DVT (deep venous thrombosis) ([place]) 11/28/2013   RT LEG  . High cholesterol   . Hypertension   . Marijuana use     Past Surgical History:  Procedure Laterality Date  . HERNIA REPAIR     [id number] and [id number]; umbilical hernia repair  . KNEE SURGERY Bilateral    Left knee 1993, right knee 2004  . TONSILLECTOMY    . TOTAL KNEE ARTHROPLASTY Left 02/10/2017   Procedure: LEFT TOTAL KNEE ARTHROPLASTY;  Surgeon: Leandrew Koyanagi, MD;  Location: [place];  Service: Orthopedics;  Laterality: Left;  . TOTAL KNEE ARTHROPLASTY Right 07/28/2017   Procedure: RIGHT TOTAL KNEE ARTHROPLASTY;  Surgeon: Leandrew Koyanagi, MD;  Location: [place];  Service: Orthopedics;  Laterality: Right;    There were no vitals filed for this visit.   Subjective Assessment - 07/28/19 1314    Subjective  Patient reports lower back has been giving him problems for quite a while, probabaly early last year. States he had an MRI that shows some degenerative arthritis and bulging discs. Patient reports the main issue is when he is standing or walking for an extended period of time he will have severe  midline low back pain and will occasionally have a shooting pain down his right leg. He has been using a back brace since his back has been bothering him, especially when standing or doing activity. He has also been using a SPC on the left side since his bilateral TKA.    Pertinent History  Bilateral TKA    Limitations  Lifting;Standing;Walking;House hold activities    How long can you sit comfortably?  No limitation    How long can you stand comfortably?  15-20 minutes    How long can you walk comfortably?  15-20 minutes    Diagnostic tests  MRI on 07/19/2019    Patient Stated Goals  He would like to be able to stand without having to sit or lean up against something, improve activity level    Currently in Pain?  Yes    Pain Score  7    1-2/10 when sitting   Pain Location  Back    Pain Orientation  Lower    Pain Descriptors / Indicators  Throbbing    Pain Type  Chronic pain    Pain Radiating Towards  occasional shooting pain down the back of the right leg    Pain Onset  More than a month ago    Pain  Frequency  Constant    Aggravating Factors   Standing, walking, extending back    Pain Relieving Factors  Sitting, medication    Effect of Pain on Daily Activities  Limited in standing or walking extended periods so has to sit frequently         [facility] PT Assessment - 07/28/19 0001      Assessment   Medical Diagnosis  Chronic bilateral low back pain without sciatica    Referring Provider (PT)  Leandrew Koyanagi, MD    Onset Date/Surgical Date  --   ~2 years ago   Hand Dominance  Right    Next MD Visit  07/10/2020    Prior Therapy  Yes - Bilat TKA      Precautions   Precautions  None      Restrictions   Weight Bearing Restrictions  No      Balance Screen   Has the patient fallen in the past 6 months  Yes    How many times?  3 - just getting up from chair or avoid tripping over something    Has the patient had a decrease in activity level because of a fear of falling?   Yes    Is the  patient reluctant to leave their home because of a fear of falling?   No      Home Environment   Living Environment  Private residence    Living Arrangements  Spouse/significant other    Type of [place] to enter    Entrance Stairs-Number of Steps  2    Entrance Stairs-Rails  Can reach both    [place]  One level      Prior Function   Level of [place]  Unemployed    Leisure  Sports, bowling, pool      Cognition   Overall Cognitive Status  Within Functional Limits for tasks assessed      Observation/Other Assessments   Observations  Patient appears in no apparent distress, he is wearing a back brace and using SPC on left    Focus on Therapeutic Outcomes (FOTO)   NA      Sensation   Light Touch  Appears Intact      Functional Tests   Functional tests  Single leg stance      Single Leg Stance   Comments  Patient unable to maintain single leg stance bilaterally      Posture/Postural Control   Posture Comments  Patient exhibits forward flexed posture, bilateral hip ER with toe out, decreased lumbar lordosis, rounded shoulders      ROM / Strength   AROM / PROM / Strength  AROM;PROM;Strength      AROM   Overall AROM Comments  All lumbar AROM increases midline back pain    AROM Assessment Site  Lumbar    Lumbar Flexion  100% - fingertips to ankles    Lumbar Extension  25%    Lumbar - Right Side Bend  100% - fingertips to knee    Lumbar - Left Side Bend  100% - fingertips to knee    Lumbar - Right Rotation  50%    Lumbar - Left Rotation  50%      PROM   Overall PROM Comments  Significant limitations with hip flexion, extension, and IR PROM, increased anterior hip pain with all movements    PROM Assessment Site  Hip  Right/Left Hip  Right;Left    Right Hip Extension  -5   increased anterior hip and lower back pain   Right Hip Flexion  95   increased anterior hip pain   Right Hip External Rotation   --   50    Right Hip Internal Rotation   -5   increased anterior hip pain   Right Hip ABduction  20    Left Hip Extension  -5   increased anterior hip pain   Left Hip Flexion  105   increased anterior hip pain   Left Hip External Rotation   55    Left Hip Internal Rotation   0   increased anterior hip pain   Left Hip ABduction  25      Strength   Overall Strength Comments  Hip MMT taken within available range due to motion limitations    Strength Assessment Site  Hip;Knee;Ankle    Right/Left Hip  Right;Left    Right Hip Flexion  4/5    Right Hip Extension  3+/5    Right Hip ABduction  3+/5    Left Hip Flexion  4+/5    Left Hip Extension  4-/5    Left Hip ABduction  3+/5    Right/Left Knee  Right;Left    Right Knee Flexion  5/5    Right Knee Extension  5/5    Left Knee Flexion  5/5    Left Knee Extension  5/5    Right/Left Ankle  Right;Left    Right Ankle Dorsiflexion  5/5    Right Ankle Plantar Flexion  4/5    Right Ankle Eversion  5/5    Left Ankle Dorsiflexion  5/5    Left Ankle Plantar Flexion  4/5    Left Ankle Eversion  5/5      Flexibility   Soft Tissue Assessment /Muscle Length  yes   Bilat hip flexor significant limitation   Hamstrings  Limited bilaterally ~60 deg    Quadriceps  Limited bilaterally   history of bilat TKA     Palpation   Spinal mobility  Hypomobile with increased midline pain throughout lumbar    Palpation comment  Minimal TTP for right gluteal region      Special Tests   Other special tests  Radicular testing negative (slump, SLR)      Ambulation/Gait   Ambulation/Gait  Yes    Ambulation/Gait Assistance  6: Modified independent (Device/Increase time)    Assistive device  Straight cane    Gait Comments  Patient ambulates with SPC on left side, forward flexed position with bilat hip ER/toe out, knees remain in relative flexion throughout gait cycle                Objective measurements completed on examination: See above findings.       [place] Adult PT Treatment/Exercise - 07/28/19 0001      Exercises   Exercises  Lumbar      Lumbar Exercises: Stretches   Lower Trunk Rotation Limitations  5 sec hold x 10 each    Hip Flexor Stretch  2 reps;60 seconds    Hip Flexor Stretch Limitations  supine with wedge under trunk to reduce stress on lower back      Lumbar Exercises: Supine   Bridge  10 reps      Lumbar Exercises: Sidelying   Clam  10 reps    Clam Limitations  red band  PT Education - 07/28/19 1307    Education Details  Exam findings, POC, HEP, possible hip contribution to low back pain, wean from back brace    Person(s) Educated  Patient    Methods  Explanation;Demonstration;Tactile cues;Verbal cues;Handout    Comprehension  Verbalized understanding;Returned demonstration;Verbal cues required;Tactile cues required;Need further instruction       PT Short Term Goals - 07/28/19 1308      PT SHORT TERM GOAL #1   Title  Patient will be I with initial HEP to progress in PT    Period  Weeks    Status  New    Target Date  08/25/19      PT SHORT TERM GOAL #2   Title  Patient will report no falls since onset of PT to indicate improve balance and mobility    Time  4    Period  Weeks    Status  New    Target Date  08/25/19        PT Long Term Goals - 07/28/19 1309      PT LONG TERM GOAL #1   Title  Patient will be I with final HEP to maintain progress from PT    Time  8    Period  Weeks    Status  New    Target Date  09/22/19      PT LONG TERM GOAL #2   Title  Patient will be able to stand and/or walk > 1 hour to improve community ambulation    Time  8    Period  Weeks    Status  New    Target Date  09/22/19      PT LONG TERM GOAL #3   Title  Patient will report improved low back pain level to </= 3/10 with all actvity including to allow him to be active with kids    Time  8    Period  Weeks    Status  New    Target Date  09/22/19      PT LONG TERM GOAL #4   Title   Patient will exhibit improved hip and core strength to grossly >/= 4/5 MMT to improve walking ability with LRAD    Time  8    Period  Weeks    Status  New    Target Date  09/22/19      PT LONG TERM GOAL #5   Title  Patient will exhibit 25% improvement in lumbar extension and rotation to improve ability to stand with improved posture    Time  8    Period  Weeks    Status  New    Target Date  09/22/19             Plan - 07/28/19 1547    Clinical Impression Statement  Patient presents to PT with report of chronic low back pain with occasional radiation of pain down the posterior aspect of right thigh. He did not exhibit any radicular symptoms this visit. He does exhibit significant lumbar hypomobility and decreased range of motion, signficant limitation of bilateral hip motion that is indicative of hip OA, bilateral hip flexor mobility limitation, hip and core strength deficits, gait deviations, and pain with all activity. His low back pain is most likely related to mobility deficit, and his hip limitations are most likely play a significant role in his lower back pain. He would benefit from continued skilled PT to progress his mobility and strength to improve  ability to stand and walk extended periods with less pain and maximize function.    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 complexity    Clinical Decision Making  Moderate    Rehab Potential  Good    PT Frequency  1x / week    PT Duration  8 weeks    PT Treatment/Interventions  ADLs/Self Care Home  Management;Cryotherapy;Electrical Stimulation;Moist Heat;Traction;Neuromuscular re-education;Balance training;Therapeutic exercise;Therapeutic activities;Functional mobility training;Stair training;Gait training;Patient/family education;Manual techniques;Dry needling;Passive range of motion;Taping;Spinal Manipulations;Vasopneumatic Device;Joint Manipulations    PT Next Visit Plan  Assess HEP and progress PRN, hip and lumbar mobility, progress hip and core strengthening    PT Home Exercise Plan  [email or web address]    Consulted and Agree with Plan of Care  Patient       Patient will benefit from skilled therapeutic intervention in order to improve the following deficits and impairments:  Abnormal gait, Decreased range of motion, Difficulty walking, Decreased activity tolerance, Pain, Decreased balance, Postural dysfunction, Decreased strength, Impaired flexibility, Hypomobility  Visit Diagnosis: Chronic bilateral low back pain, unspecified whether sciatica present  Muscle weakness (generalized)  Stiffness of left hip, not elsewhere classified  Stiffness of right hip, not elsewhere classified  Other abnormalities of gait and mobility  Repeated falls     Problem List Patient Active Problem List   Diagnosis Date Noted  . Chronic bilateral low back pain without sciatica 07/25/2019  . Status post left knee replacement 07/11/2019  . Neurogenic claudication 05/19/2019  . Hyperlipidemia LDL goal <70 07/20/2018  . Status post right knee replacement 07/28/2017  . Preoperative clearance 07/20/2017  . History of pulmonary embolus (PE) 07/20/2017  . Mild CAD 07/20/2017  . Pain in left ankle and joints of left foot 06/21/2017  . S/P TKR (total knee replacement), left 02/10/2017  . Chronic anticoagulation 01/26/2017  . Acute pulmonary embolus ([place])   . Primary osteoarthritis of left knee 07/28/2016  . Chest pain   . Abnormal EKG   . Pulmonary embolism ([place]) 06/03/2016  . Unilateral primary  osteoarthritis, right knee 08/08/2015  . Encounter for chronic pain management 04/06/2014  . Tobacco abuse 12/05/2013  . DVT (deep venous thrombosis) ([place]) 11/28/2013  . Candidate for statin therapy due to risk of future cardiovascular event 05/05/2013  . HTN (hypertension) 05/04/2013  . Bilateral knee pain 05/04/2013  . Lumbar back pain 05/04/2013  . Healthcare maintenance 05/04/2013  . BMI 33.0-33.9,adult 05/04/2013  . Hx of substance abuse ([place]) 05/04/2013    Hilda Blades, PT, DPT, LAT, ATC 07/28/19  4:03 PM   [facility] [address] [place], [place], [address] Phone: [phone]   Fax:  [phone]  Name: Jeffrey [name] MRN: [id number] Date of Birth: [birth year]

## 2019-07-28 NOTE — Patient Instructions (Signed)
Access Code: [email or web address] URL: [email or web address] Date: 07/28/2019 Prepared by: Hilda Blades  Exercises Supine Lower Trunk Rotation - 2-3 x daily - 7 x weekly - 10 reps - 5 seconds hold Bridge - 1 x daily - 7 x weekly - 3 sets - 5-8 reps Clamshell with Resistance - 1 x daily - 7 x weekly - 3 sets - 10 reps Modified Thomas Stretch - 2-3 x daily - 7 x weekly - 3 reps - 30-45 seconds hold

## 2019-08-02 ENCOUNTER — Encounter: Payer: 59 | Admitting: Physical Therapy

## 2019-08-02 ENCOUNTER — Telehealth: Payer: Self-pay | Admitting: Physical Therapy

## 2019-08-02 NOTE — Telephone Encounter (Signed)
Pt no show for PT appointment today. They were contacted and informed of this and he relays he got his days mixed up but he will be at his next appointment. They were provided the date and time of their next appointment.  They were instructed to call us to let us know if they cannot make their appointment.  Elsie Ra, PT, DPT 08/02/19 1:57 PM

## 2019-08-11 ENCOUNTER — Encounter: Payer: Self-pay | Admitting: Rehabilitative and Restorative Service Providers"

## 2019-08-11 ENCOUNTER — Other Ambulatory Visit: Payer: Self-pay

## 2019-08-11 ENCOUNTER — Ambulatory Visit (INDEPENDENT_AMBULATORY_CARE_PROVIDER_SITE_OTHER): Payer: 59 | Admitting: Rehabilitative and Restorative Service Providers"

## 2019-08-11 DIAGNOSIS — M545 Low back pain, unspecified: Secondary | ICD-10-CM

## 2019-08-11 DIAGNOSIS — M25652 Stiffness of left hip, not elsewhere classified: Secondary | ICD-10-CM

## 2019-08-11 DIAGNOSIS — M6281 Muscle weakness (generalized): Secondary | ICD-10-CM | POA: Diagnosis not present

## 2019-08-11 DIAGNOSIS — G8929 Other chronic pain: Secondary | ICD-10-CM

## 2019-08-11 DIAGNOSIS — M25651 Stiffness of right hip, not elsewhere classified: Secondary | ICD-10-CM | POA: Diagnosis not present

## 2019-08-11 DIAGNOSIS — R2689 Other abnormalities of gait and mobility: Secondary | ICD-10-CM

## 2019-08-11 NOTE — Therapy (Signed)
[facility] [address] [place], [place], [address] Phone: [phone]   Fax:  [phone]  Physical Therapy Treatment  Patient Details  Name: Jeffrey [name] MRN: [id number] Date of Birth: 06/01/1965 Referring Provider (PT): Leandrew Koyanagi, MD   Encounter Date: 08/11/2019  PT End of Session - 08/11/19 1301    Visit Number  2    Number of Visits  8    Date for PT Re-Evaluation  09/22/19    Authorization Type  [facility]    PT Start Time  1300    PT Stop Time  1340    PT Time Calculation (min)  40 min    Activity Tolerance  Patient tolerated treatment well    Behavior During Therapy  [facility] for tasks assessed/performed       Past Medical History:  Diagnosis Date  . Acute pulmonary embolus (HCC)    bilateral submassive PE in setting of missing several doses of Xarelto for his DVT  . Arthritis   . DVT (deep venous thrombosis) ([place]) 11/28/2013   RT LEG  . High cholesterol   . Hypertension   . Marijuana use     Past Surgical History:  Procedure Laterality Date  . HERNIA REPAIR     [id number] and [id number]; umbilical hernia repair  . KNEE SURGERY Bilateral    Left knee 1993, right knee 2004  . TONSILLECTOMY    . TOTAL KNEE ARTHROPLASTY Left 02/10/2017   Procedure: LEFT TOTAL KNEE ARTHROPLASTY;  Surgeon: Leandrew Koyanagi, MD;  Location: [place];  Service: Orthopedics;  Laterality: Left;  . TOTAL KNEE ARTHROPLASTY Right 07/28/2017   Procedure: RIGHT TOTAL KNEE ARTHROPLASTY;  Surgeon: Leandrew Koyanagi, MD;  Location: [place];  Service: Orthopedics;  Laterality: Right;    There were no vitals filed for this visit.  Subjective Assessment - 08/11/19 1315    Subjective  Pt. indicated feeling some mild improvements in mobility at times but overall still hurting similar.  4/10 upon arrival.    Pertinent History  Bilateral TKA    Limitations  Lifting;Standing;Walking;House hold activities    How long can you sit comfortably?  No limitation    How long can you stand  comfortably?  15-20 minutes    How long can you walk comfortably?  15-20 minutes    Diagnostic tests  MRI on 07/19/2019    Patient Stated Goals  He would like to be able to stand without having to sit or lean up against something, improve activity level    Currently in Pain?  Yes    Pain Score  4     Pain Location  Back    Pain Orientation  Lower    Pain Descriptors / Indicators  Throbbing;Tightness    Pain Type  Chronic pain    Pain Onset  More than a month ago    Aggravating Factors   prolonged standing/walking, hip/back mobility end ranges.    Pain Relieving Factors  rest                        [facility] Adult PT Treatment/Exercise - 08/11/19 0001      Ambulation/Gait   Ambulation/Gait  Yes    Ambulation/Gait Assistance  6: Modified independent (Device/Increase time)    Assistive device  Straight cane      Lumbar Exercises: Stretches   Lower Trunk Rotation Limitations  15 sec x 5 bilateral    Hip Flexor Stretch  Left;Right;3  reps   15 seconds bilateral thomas test stretching   Other Lumbar Stretch Exercise  prone quad stretch c strap 30 sec x 5 bilateral      Lumbar Exercises: Aerobic   Nustep  Lvl 5 10 mins      Lumbar Exercises: Supine   Bridge  15 reps      Manual Therapy   Manual therapy comments  Lt hip g3-g4 inferior jt mobs, lateral jt mobs in 80 deg flexion, prone PA mobs Lt hip               PT Short Term Goals - 07/28/19 1308      PT SHORT TERM GOAL #1   Title  Patient will be I with initial HEP to progress in PT    Period  Weeks    Status  New    Target Date  08/25/19      PT SHORT TERM GOAL #2   Title  Patient will report no falls since onset of PT to indicate improve balance and mobility    Time  4    Period  Weeks    Status  New    Target Date  08/25/19        PT Long Term Goals - 07/28/19 1309      PT LONG TERM GOAL #1   Title  Patient will be I with final HEP to maintain progress from PT    Time  8    Period  Weeks     Status  New    Target Date  09/22/19      PT LONG TERM GOAL #2   Title  Patient will be able to stand and/or walk > 1 hour to improve community ambulation    Time  8    Period  Weeks    Status  New    Target Date  09/22/19      PT LONG TERM GOAL #3   Title  Patient will report improved low back pain level to </= 3/10 with all actvity including to allow him to be active with kids    Time  8    Period  Weeks    Status  New    Target Date  09/22/19      PT LONG TERM GOAL #4   Title  Patient will exhibit improved hip and core strength to grossly >/= 4/5 MMT to improve walking ability with LRAD    Time  8    Period  Weeks    Status  New    Target Date  09/22/19      PT LONG TERM GOAL #5   Title  Patient will exhibit 25% improvement in lumbar extension and rotation to improve ability to stand with improved posture    Time  8    Period  Weeks    Status  New    Target Date  09/22/19            Plan - 08/11/19 1317    Clinical Impression Statement  Noted lumbar and hip mobility restrictions bilateral from joint and myofascial restrictions.  Bilateral hip extension/ir severely limited c joint crepitis noted in movements.  Pt. may benefit from skilled PT to improve mobility of affected hip/back areas.    Personal Factors and Comorbidities  Past/Current Experience;Time since onset of injury/illness/exacerbation;Comorbidity 3+    Comorbidities  Bilateral knee TKA, severe hip OA based on x-ray report    Examination-Activity Limitations  Locomotion  Level;Transfers;Carry;Dressing;Lift;Stand;Stairs;Squat    Examination-Participation Restrictions  Meal Prep;Cleaning;Community Activity;Shop;Yard Work    [employer] officer  Evolving/Moderate complexity    Rehab Potential  Good    PT Frequency  1x / week    PT Duration  8 weeks    PT Treatment/Interventions  ADLs/Self Care Home Management;Cryotherapy;Electrical Stimulation;Moist Heat;Traction;Neuromuscular  re-education;Balance training;Therapeutic exercise;Therapeutic activities;Functional mobility training;Stair training;Gait training;Patient/family education;Manual techniques;Dry needling;Passive range of motion;Taping;Spinal Manipulations;Vasopneumatic Device;Joint Manipulations    PT Next Visit Plan  Hip joint mobility c manual intervention    PT Home Exercise Plan  [email or web address]    Consulted and Agree with Plan of Care  Patient       Patient will benefit from skilled therapeutic intervention in order to improve the following deficits and impairments:  Abnormal gait, Decreased range of motion, Difficulty walking, Decreased activity tolerance, Pain, Decreased balance, Postural dysfunction, Decreased strength, Impaired flexibility, Hypomobility  Visit Diagnosis: Chronic bilateral low back pain, unspecified whether sciatica present  Muscle weakness (generalized)  Stiffness of left hip, not elsewhere classified  Stiffness of right hip, not elsewhere classified  Other abnormalities of gait and mobility     Problem List Patient Active Problem List   Diagnosis Date Noted  . Chronic bilateral low back pain without sciatica 07/25/2019  . Status post left knee replacement 07/11/2019  . Neurogenic claudication 05/19/2019  . Hyperlipidemia LDL goal <70 07/20/2018  . Status post right knee replacement 07/28/2017  . Preoperative clearance 07/20/2017  . History of pulmonary embolus (PE) 07/20/2017  . Mild CAD 07/20/2017  . Pain in left ankle and joints of left foot 06/21/2017  . S/P TKR (total knee replacement), left 02/10/2017  . Chronic anticoagulation 01/26/2017  . Acute pulmonary embolus ([place])   . Primary osteoarthritis of left knee 07/28/2016  . Chest pain   . Abnormal EKG   . Pulmonary embolism ([place]) 06/03/2016  . Unilateral primary osteoarthritis, right knee 08/08/2015  . Encounter for chronic pain management 04/06/2014  . Tobacco abuse 12/05/2013  . DVT (deep venous thrombosis)  ([place]) 11/28/2013  . Candidate for statin therapy due to risk of future cardiovascular event 05/05/2013  . HTN (hypertension) 05/04/2013  . Bilateral knee pain 05/04/2013  . Lumbar back pain 05/04/2013  . Healthcare maintenance 05/04/2013  . BMI 33.0-33.9,adult 05/04/2013  . Hx of substance abuse ([place]) 05/04/2013   Scot Jun, PT, DPT, OCS, ATC 08/11/19  1:37 PM    [facility] [address] [place], [place], [address] Phone: [phone]   Fax:  [phone]  Name: Jeffrey [name] MRN: [id number] Date of Birth: 1966-02-22

## 2019-08-18 ENCOUNTER — Other Ambulatory Visit: Payer: Self-pay

## 2019-08-18 ENCOUNTER — Encounter: Payer: Self-pay | Admitting: Rehabilitative and Restorative Service Providers"

## 2019-08-18 ENCOUNTER — Ambulatory Visit (INDEPENDENT_AMBULATORY_CARE_PROVIDER_SITE_OTHER): Payer: 59 | Admitting: Rehabilitative and Restorative Service Providers"

## 2019-08-18 DIAGNOSIS — G8929 Other chronic pain: Secondary | ICD-10-CM

## 2019-08-18 DIAGNOSIS — M545 Low back pain, unspecified: Secondary | ICD-10-CM

## 2019-08-18 DIAGNOSIS — M25651 Stiffness of right hip, not elsewhere classified: Secondary | ICD-10-CM

## 2019-08-18 DIAGNOSIS — M6281 Muscle weakness (generalized): Secondary | ICD-10-CM | POA: Diagnosis not present

## 2019-08-18 DIAGNOSIS — M25652 Stiffness of left hip, not elsewhere classified: Secondary | ICD-10-CM

## 2019-08-18 DIAGNOSIS — R2689 Other abnormalities of gait and mobility: Secondary | ICD-10-CM | POA: Diagnosis not present

## 2019-08-18 NOTE — Therapy (Addendum)
[facility] [address] [place], [place], [address] Phone: [phone]   Fax:  [phone]  Physical Therapy Treatment/Discharge  Patient Details  Name: Jeffrey [name] MRN: [id number] Date of Birth: 1965/07/30 Referring Provider (PT): Leandrew Koyanagi, MD   Encounter Date: 08/18/2019  PT End of Session - 08/18/19 1343    Visit Number  3    Number of Visits  8    Date for PT Re-Evaluation  09/22/19    Authorization Type  [facility] [employer]    PT Start Time  4944    PT Stop Time  1422    PT Time Calculation (min)  39 min    Activity Tolerance  Patient tolerated treatment well    Behavior During Therapy  [facility] for tasks assessed/performed       Past Medical History:  Diagnosis Date  . Acute pulmonary embolus (HCC)    bilateral submassive PE in setting of missing several doses of Xarelto for his DVT  . Arthritis   . DVT (deep venous thrombosis) ([place]) 11/28/2013   RT LEG  . High cholesterol   . Hypertension   . Marijuana use     Past Surgical History:  Procedure Laterality Date  . HERNIA REPAIR     9675 and 9163; umbilical hernia repair  . KNEE SURGERY Bilateral    Left knee 1993, right knee 2004  . TONSILLECTOMY    . TOTAL KNEE ARTHROPLASTY Left 02/10/2017   Procedure: LEFT TOTAL KNEE ARTHROPLASTY;  Surgeon: Leandrew Koyanagi, MD;  Location: [place];  Service: Orthopedics;  Laterality: Left;  . TOTAL KNEE ARTHROPLASTY Right 07/28/2017   Procedure: RIGHT TOTAL KNEE ARTHROPLASTY;  Surgeon: Leandrew Koyanagi, MD;  Location: [place];  Service: Orthopedics;  Laterality: Right;    There were no vitals filed for this visit.  Subjective Assessment - 08/18/19 1354    Subjective  Pt. indicated his pain on Lt side improved some since last visit.  Pt. did report severe pain in Rt posterior hip/thigh and Rt anterior hip after going up and downs stairs a few times.  Felt like he wanted to improve the Rt hip like the Lt hip.    Pertinent History  Bilateral TKA     Limitations  Lifting;Standing;Walking;House hold activities    How long can you sit comfortably?  No limitation    How long can you stand comfortably?  15-20 minutes    How long can you walk comfortably?  15-20 minutes    Diagnostic tests  MRI on 07/19/2019    Patient Stated Goals  He would like to be able to stand without having to sit or lean up against something, improve activity level    Pain Score  8     Pain Location  Back   back/hip/thigh Rt   Pain Orientation  Right    Pain Descriptors / Indicators  Tightness;Aching;Sharp    Pain Type  Chronic pain    Pain Onset  More than a month ago    Aggravating Factors   stairs up/down.    Pain Relieving Factors  Lt hip improved c mobilizations                        [facility] Adult PT Treatment/Exercise - 08/18/19 0001      Lumbar Exercises: Stretches   Hip Flexor Stretch  5 reps;30 seconds;Left;Right    Hip Flexor Stretch Limitations  supine thomas stretch  Lumbar Exercises: Aerobic   Nustep  Lvl 5 10 mins      Lumbar Exercises: Supine   Bridge  20 reps    Other Supine Lumbar Exercises  clam shell blue band 3 x 10 bilateral      Manual Therapy   Manual therapy comments  Rt hip g3-g4 inferior jt mobs, lateral jt mobs in 80 deg flexion, prone PA mobs Lt hip               PT Short Term Goals - 07/28/19 1308      PT SHORT TERM GOAL #1   Title  Patient will be I with initial HEP to progress in PT    Period  Weeks    Status  New    Target Date  08/25/19      PT SHORT TERM GOAL #2   Title  Patient will report no falls since onset of PT to indicate improve balance and mobility    Time  4    Period  Weeks    Status  New    Target Date  08/25/19        PT Long Term Goals - 07/28/19 1309      PT LONG TERM GOAL #1   Title  Patient will be I with final HEP to maintain progress from PT    Time  8    Period  Weeks    Status  New    Target Date  09/22/19      PT LONG TERM GOAL #2   Title  Patient  will be able to stand and/or walk > 1 hour to improve community ambulation    Time  8    Period  Weeks    Status  New    Target Date  09/22/19      PT LONG TERM GOAL #3   Title  Patient will report improved low back pain level to </= 3/10 with all actvity including to allow him to be active with kids    Time  8    Period  Weeks    Status  New    Target Date  09/22/19      PT LONG TERM GOAL #4   Title  Patient will exhibit improved hip and core strength to grossly >/= 4/5 MMT to improve walking ability with LRAD    Time  8    Period  Weeks    Status  New    Target Date  09/22/19      PT LONG TERM GOAL #5   Title  Patient will exhibit 25% improvement in lumbar extension and rotation to improve ability to stand with improved posture    Time  8    Period  Weeks    Status  New    Target Date  09/22/19            Plan - 08/18/19 1357    Clinical Impression Statement  Improved symptoms noted from Lt hip mobilizations.  Repeated intervention on Rt hip today to promote improved mobility and symptoms.  Gait today revealed Rt LE antalgic gait c stance reduction.  SPC in Lt UE today.  Continued reassessment of symptom improvements from hip mobilization indicated.    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 complexity    Rehab Potential  Good    PT Frequency  1x / week    PT Duration  8 weeks    PT Treatment/Interventions  ADLs/Self Care Home Management;Cryotherapy;Electrical Stimulation;Moist Heat;Traction;Neuromuscular re-education;Balance training;Therapeutic  exercise;Therapeutic activities;Functional mobility training;Stair training;Gait training;Patient/family education;Manual techniques;Dry needling;Passive range of motion;Taping;Spinal Manipulations;Vasopneumatic Device;Joint Manipulations    PT Next Visit Plan  Hip manual intervention for mobility, lumbar mobility improvements.    PT Home Exercise Plan  [id number]    Consulted and Agree with Plan of Care  Patient       Patient will benefit from skilled therapeutic intervention in order to improve the following deficits and impairments:  Abnormal gait, Decreased range of motion, Difficulty walking, Decreased activity tolerance, Pain, Decreased balance, Postural dysfunction, Decreased strength, Impaired flexibility, Hypomobility  Visit Diagnosis: Chronic bilateral low back pain, unspecified whether sciatica present  Muscle weakness (generalized)  Stiffness of left hip, not elsewhere classified  Stiffness of right hip, not elsewhere classified  Other abnormalities of gait and mobility     Problem List Patient Active Problem List   Diagnosis Date Noted  . Chronic bilateral low back pain without sciatica 07/25/2019  . Status post left knee replacement 07/11/2019  . Neurogenic claudication 05/19/2019  . Hyperlipidemia LDL goal <70 07/20/2018  . Status post right knee replacement 07/28/2017  . Preoperative clearance 07/20/2017  . History of pulmonary embolus (PE) 07/20/2017  . Mild CAD 07/20/2017  . Pain in left ankle and joints of left foot 06/21/2017  . S/P TKR (total knee replacement), left 02/10/2017  . Chronic anticoagulation 01/26/2017  . Acute pulmonary embolus ([place])   . Primary osteoarthritis of left knee 07/28/2016  . Chest pain   . Abnormal EKG   . Pulmonary embolism ([place]) 06/03/2016  . Unilateral primary osteoarthritis, right knee 08/08/2015  . Encounter for chronic pain management 04/06/2014  . Tobacco abuse 12/05/2013  . DVT (deep venous thrombosis) ([place]) 11/28/2013   . Candidate for statin therapy due to risk of future cardiovascular event 05/05/2013  . HTN (hypertension) 05/04/2013  . Bilateral knee pain 05/04/2013  . Lumbar back pain 05/04/2013  . Healthcare maintenance 05/04/2013  . BMI 33.0-33.9,adult 05/04/2013  . Hx of substance abuse ([place]) 05/04/2013   Scot Jun, PT, DPT, OCS, ATC 08/18/19  2:15 PM  PHYSICAL THERAPY DISCHARGE SUMMARY  Visits from Start of Care: 3  Current functional level related to goals / functional outcomes: See note   Remaining deficits: See note   Education / Equipment: HEP Plan: Patient agrees to discharge.  Patient goals were partially met. Patient is being discharged due to not returning since the last visit.  ?????     Scot Jun, PT, DPT, OCS, ATC 10/31/19  12:18 PM    [facility] [address] [place], [place], [address] Phone: [phone]   Fax:  [phone]  Name: Jeffrey [name] MRN: [id number] Date of Birth: 03/04/1966

## 2019-08-24 ENCOUNTER — Encounter: Payer: 59 | Admitting: Rehabilitative and Restorative Service Providers"

## 2019-08-29 ENCOUNTER — Ambulatory Visit (INDEPENDENT_AMBULATORY_CARE_PROVIDER_SITE_OTHER): Payer: 59 | Admitting: Physical Medicine and Rehabilitation

## 2019-08-29 ENCOUNTER — Encounter: Payer: Self-pay | Admitting: Physical Medicine and Rehabilitation

## 2019-08-29 ENCOUNTER — Ambulatory Visit: Payer: Self-pay

## 2019-08-29 ENCOUNTER — Other Ambulatory Visit: Payer: Self-pay

## 2019-08-29 VITALS — BP 132/88 | HR 106

## 2019-08-29 DIAGNOSIS — M47816 Spondylosis without myelopathy or radiculopathy, lumbar region: Secondary | ICD-10-CM | POA: Diagnosis not present

## 2019-08-29 MED ORDER — METHYLPREDNISOLONE ACETATE 80 MG/ML IJ SUSP
40.0000 mg | Freq: Once | INTRAMUSCULAR | Status: AC
Start: 2019-08-29 — End: 2019-08-29
  Administered 2019-08-29: 40 mg

## 2019-08-29 MED ORDER — BUPIVACAINE HCL 0.5 % IJ SOLN
3.0000 mL | Freq: Once | INTRAMUSCULAR | Status: AC
  Administered 2019-08-29: 3 mL

## 2019-08-29 NOTE — Progress Notes (Signed)
Pt states pain in the middle and lower back that radiates into the outside right leg. Pt also states pain in the right hip (groin pain). Pt states pain started about a year ago. Standing and lifting makes pain worse. Not being active makes pain better.   .Numeric Pain Rating Scale and Functional Assessment Average Pain 6   In the last MONTH (on 0-10 scale) has pain interfered with the following?  1. General activity like being  able to carry out your everyday physical activities such as walking, climbing stairs, carrying groceries, or moving a chair?  Rating(9)   +Driver, +BT(eliquis, ok for injection), -Dye Allergies.

## 2019-08-31 NOTE — Progress Notes (Signed)
Jeffrey [name] - 54 y.o. male MRN [id number]  Date of birth: 02-01-1966  Office Visit Note: Visit Date: 08/29/2019 PCP: Caroline More, DO Referred by: Caroline More, DO  Subjective: Chief Complaint  Patient presents with  . Middle Back - Pain  . Lower Back - Pain  . Right Hip - Pain  . Right Leg - Pain   HPI:  Jeffrey [name] is a 54 y.o. male who comes in today for planned Bilateral L5-S1 medial branch block with fluoroscopic guidance.  The patient has failed conservative care including home exercise, medications, time and activity modification.  This injection will be diagnostic and hopefully therapeutic.  Please see requesting physician notes for further details and justification.  Exam shows concordant low back pain with facet joint loading and extension.   He does have other pain complaints radiating into the groin at some point a little bit lateral thigh but nothing past the knee and no paresthesia.  Difficult case from a medical standpoint he has a host of other medical conditions.  ROS Otherwise per HPI.  Assessment & Plan: Visit Diagnoses:  1. Spondylosis without myelopathy or radiculopathy, lumbar region     Plan: No additional findings.   Meds & Orders:  Meds ordered this encounter  Medications  . bupivacaine (MARCAINE) 0.5 % (with pres) injection 3 mL  . methylPREDNISolone acetate (DEPO-MEDROL) injection 40 mg    Orders Placed This Encounter  Procedures  . Facet Injection  . XR C-ARM NO REPORT    Follow-up: Return for Review Pain Diary.   Procedures: No procedures performed  Lumbar Diagnostic Facet Joint Nerve Block with Fluoroscopic Guidance   Patient: Jeffrey [name]      Date of Birth: 06-Dec-1965 MRN: [id number] PCP: Caroline More, DO      Visit Date: 08/29/2019   Universal Protocol:    Date/Time: 08/30/2108:13 PM  Consent Given By: the patient  Position: PRONE  Additional Comments: Vital signs were monitored before and after the  procedure. Patient was prepped and draped in the usual sterile fashion. The correct patient, procedure, and site was verified.   Injection Procedure Details:  Procedure Site One Meds Administered:  Meds ordered this encounter  Medications  . bupivacaine (MARCAINE) 0.5 % (with pres) injection 3 mL  . methylPREDNISolone acetate (DEPO-MEDROL) injection 40 mg     Laterality: Bilateral  Location/Site:  L5-S1  Needle size: 22 ga.  Needle type:spinal  Needle Placement: Oblique pedical  Findings:   -Comments: There was excellent flow of contrast along the articular pillars without intravascular flow.  Procedure Details: The fluoroscope beam is vertically oriented in AP and then obliqued 15 to 20 degrees to the ipsilateral side of the desired nerve to achieve the "Scotty dog" appearance.  The skin over the target area of the junction of the superior articulating process and the transverse process (sacral ala if blocking the L5 dorsal rami) was locally anesthetized with a 1 ml volume of 1% Lidocaine without Epinephrine.  The spinal needle was inserted and advanced in a trajectory view down to the target.   After contact with periosteum and negative aspirate for blood and CSF, correct placement without intravascular or epidural spread was confirmed by injecting 0.5 ml. of Isovue-250.  A spot radiograph was obtained of this image.    Next, a 0.5 ml. volume of the injectate described above was injected. The needle was then redirected to the other facet joint nerves mentioned above if needed.  Prior to the procedure,  the patient was given a Pain Diary which was completed for baseline measurements.  After the procedure, the patient rated their pain every 30 minutes and will continue rating at this frequency for a total of 5 hours.  The patient has been asked to complete the Diary and return to [place] by mail, fax or hand delivered as soon as possible.   Additional Comments:  The patient  tolerated the procedure well Dressing: 2 x 2 sterile gauze and Band-Aid    Post-procedure details: Patient was observed during the procedure. Post-procedure instructions were reviewed.  Patient left the clinic in stable condition.    Clinical History: MRI LUMBAR SPINE WITHOUT CONTRAST  TECHNIQUE: Multiplanar, multisequence MR imaging of the lumbar spine was performed. No intravenous contrast was administered.  COMPARISON:  None.  FINDINGS: Segmentation:  Standard.  Alignment:  Physiologic.  Vertebrae:  No fracture, evidence of discitis, or bone lesion.  Other: Ankylosis of the right SI joint. Mild osteoarthritis of the left SI joint.  Conus medullaris and cauda equina: Conus extends to the above the T12 level. Conus and cauda equina appear normal.  Paraspinal and other soft tissues: No acute paraspinal abnormality.  Disc levels:  Disc spaces: Degenerative disease mild disc height loss at L1-2. Disc desiccation at L2-3 and L3-4.  T12-L1: No significant disc bulge. No evidence of neural foraminal stenosis. No central canal stenosis.  L1-L2: Left foraminal/lateral small disc osteophyte complex. No evidence of neural foraminal stenosis. No central canal stenosis.  L2-L3: Large left far lateral disc osteophyte complex. Mild broad-based disc bulge. No evidence of neural foraminal stenosis. No central canal stenosis.  L3-L4: Mild broad-based disc bulge. Mild bilateral facet arthropathy. Prominence of the epidural fat deforming the thecal sac as can be seen with epidural lipomatosis. Mild left foraminal narrowing. No right foraminal narrowing. No central canal stenosis.  L4-L5: Mild broad-based disc bulge. Mild bilateral facet arthropathy. Prominence of the epidural fat deforming the thecal sac as can be seen with epidural lipomatosis. No central canal stenosis. No foraminal stenosis.  L5-S1: No significant disc bulge. No foraminal stenosis.  Moderate bilateral facet arthropathy. Prominence of the epidural fat deforming the thecal sac as can be seen with epidural lipomatosis. No central canal stenosis.  IMPRESSION: 1. At L3-4 there is a mild broad-based disc bulge. Mild bilateral facet arthropathy. Prominence of the epidural fat deforming the thecal sac as can be seen with epidural lipomatosis. Mild left foraminal narrowing. 2. At L4-5 there is a mild broad-based disc bulge. Mild bilateral facet arthropathy. Prominence of the epidural fat deforming the thecal sac as can be seen with epidural lipomatosis. 3. At L1-2 there is a left foraminal/lateral small disc osteophyte complex. 4. At L5-S1 there is moderate bilateral facet arthropathy. Prominence of the epidural fat deforming the thecal sac as can be seen with epidural lipomatosis.   Electronically Signed   By: Kathreen Devoid   On: 07/19/2019 07:42     Objective:  VS:  HT:    WT:   BMI:     BP:132/88  HR:(!) 106bpm  TEMP: ( )  RESP:  Physical Exam Constitutional:      General: He is not in acute distress.    Appearance: Normal appearance. He is not ill-appearing.  HENT:     Head: Normocephalic and atraumatic.     Right Ear: External ear normal.     Left Ear: External ear normal.  Eyes:     Extraocular Movements: Extraocular movements intact.  Cardiovascular:  Rate and Rhythm: Normal rate.     Pulses: Normal pulses.  Abdominal:     General: There is no distension.     Palpations: Abdomen is soft.  Musculoskeletal:        General: No tenderness or signs of injury.     Right lower leg: No edema.     Left lower leg: No edema.     Comments: Patient has good distal strength without clonus.  Concordant pain with facet loading and extension of the lumbar spine.  Skin:    Findings: No erythema or rash.  Neurological:     General: No focal deficit present.     Mental Status: He is alert and oriented to person, place, and time.     Sensory: No sensory  deficit.     Motor: No weakness or abnormal muscle tone.     Coordination: Coordination normal.  Psychiatric:        Mood and Affect: Mood normal.        Behavior: Behavior normal.      Imaging: No results found.

## 2019-08-31 NOTE — Procedures (Signed)
Lumbar Diagnostic Facet Joint Nerve Block with Fluoroscopic Guidance   Patient: Jeffrey [name]      Date of Birth: Mar 29, 1966 MRN: [id number] PCP: Caroline More, DO      Visit Date: 08/29/2019   Universal Protocol:    Date/Time: 08/30/2108:13 PM  Consent Given By: the patient  Position: PRONE  Additional Comments: Vital signs were monitored before and after the procedure. Patient was prepped and draped in the usual sterile fashion. The correct patient, procedure, and site was verified.   Injection Procedure Details:  Procedure Site One Meds Administered:  Meds ordered this encounter  Medications  . bupivacaine (MARCAINE) 0.5 % (with pres) injection 3 mL  . methylPREDNISolone acetate (DEPO-MEDROL) injection 40 mg     Laterality: Bilateral  Location/Site:  L5-S1  Needle size: 22 ga.  Needle type:spinal  Needle Placement: Oblique pedical  Findings:   -Comments: There was excellent flow of contrast along the articular pillars without intravascular flow.  Procedure Details: The fluoroscope beam is vertically oriented in AP and then obliqued 15 to 20 degrees to the ipsilateral side of the desired nerve to achieve the "Scotty dog" appearance.  The skin over the target area of the junction of the superior articulating process and the transverse process (sacral ala if blocking the L5 dorsal rami) was locally anesthetized with a 1 ml volume of 1% Lidocaine without Epinephrine.  The spinal needle was inserted and advanced in a trajectory view down to the target.   After contact with periosteum and negative aspirate for blood and CSF, correct placement without intravascular or epidural spread was confirmed by injecting 0.5 ml. of Isovue-250.  A spot radiograph was obtained of this image.    Next, a 0.5 ml. volume of the injectate described above was injected. The needle was then redirected to the other facet joint nerves mentioned above if needed.  Prior to the procedure,  the patient was given a Pain Diary which was completed for baseline measurements.  After the procedure, the patient rated their pain every 30 minutes and will continue rating at this frequency for a total of 5 hours.  The patient has been asked to complete the Diary and return to [place] by mail, fax or hand delivered as soon as possible.   Additional Comments:  The patient tolerated the procedure well Dressing: 2 x 2 sterile gauze and Band-Aid    Post-procedure details: Patient was observed during the procedure. Post-procedure instructions were reviewed.  Patient left the clinic in stable condition.

## 2019-09-01 ENCOUNTER — Encounter: Payer: 59 | Admitting: Physical Therapy

## 2019-09-05 ENCOUNTER — Encounter: Payer: 59 | Admitting: Rehabilitative and Restorative Service Providers"

## 2019-09-05 ENCOUNTER — Telehealth: Payer: Self-pay | Admitting: Rehabilitative and Restorative Service Providers"

## 2019-09-05 NOTE — Telephone Encounter (Signed)
Called and left voice message for patient regarding missed appointment today.  Requested call back to clinic to confirm desire to attend next scheduled visit on June 10 at [facility], PT, DPT, OCS, ATC 09/05/19  1:18 PM

## 2019-09-07 ENCOUNTER — Encounter: Payer: 59 | Admitting: Rehabilitative and Restorative Service Providers"

## 2019-09-07 ENCOUNTER — Telehealth: Payer: Self-pay | Admitting: Rehabilitative and Restorative Service Providers"

## 2019-09-07 NOTE — Telephone Encounter (Signed)
Called and LVM following no show. Second no show in a row so future appointments will be cancelled and Pt. May call to schedule 1 appointment at a time if desired.  Informed by message.  Scot Jun, PT, DPT, OCS, ATC 09/07/19  1:19 PM

## 2019-09-12 ENCOUNTER — Encounter: Payer: 59 | Admitting: Rehabilitative and Restorative Service Providers"

## 2019-09-14 ENCOUNTER — Encounter: Payer: 59 | Admitting: Rehabilitative and Restorative Service Providers"

## 2019-09-20 ENCOUNTER — Ambulatory Visit (INDEPENDENT_AMBULATORY_CARE_PROVIDER_SITE_OTHER): Payer: 59 | Admitting: Physical Medicine and Rehabilitation

## 2019-09-20 ENCOUNTER — Other Ambulatory Visit: Payer: Self-pay

## 2019-09-20 ENCOUNTER — Encounter: Payer: Self-pay | Admitting: Physical Medicine and Rehabilitation

## 2019-09-20 ENCOUNTER — Ambulatory Visit: Payer: Self-pay

## 2019-09-20 VITALS — BP 125/90 | HR 101

## 2019-09-20 DIAGNOSIS — M47816 Spondylosis without myelopathy or radiculopathy, lumbar region: Secondary | ICD-10-CM

## 2019-09-20 MED ORDER — METHYLPREDNISOLONE ACETATE 80 MG/ML IJ SUSP
40.0000 mg | Freq: Once | INTRAMUSCULAR | Status: AC
Start: 2019-09-20 — End: 2019-09-20
  Administered 2019-09-20: 40 mg

## 2019-09-20 NOTE — Progress Notes (Signed)
Pt states pain across the lower back . Pt states last injection helped out a lot and lasted for a week post injection 08/29/19. Standing on feet for a long time makes pain worse. Sitting helps with pain.   .Numeric Pain Rating Scale and Functional Assessment Average Pain 5   In the last MONTH (on 0-10 scale) has pain interfered with the following?  1. General activity like being  able to carry out your everyday physical activities such as walking, climbing stairs, carrying groceries, or moving a chair?  Rating(8)   +Driver, +BT(eliquis, ok for inj), -Dye Allergies.

## 2019-09-21 NOTE — Procedures (Signed)
Lumbar Diagnostic Facet Joint Nerve Block with Fluoroscopic Guidance   Patient: Jeffrey [name]      Date of Birth: [birth year] MRN: [id number] PCP: Caroline More, DO      Visit Date: 09/20/2019   Universal Protocol:    Date/Time: 06/24/216:24 AM  Consent Given By: the patient  Position: PRONE  Additional Comments: Vital signs were monitored before and after the procedure. Patient was prepped and draped in the usual sterile fashion. The correct patient, procedure, and site was verified.   Injection Procedure Details:  Procedure Site One Meds Administered:  Meds ordered this encounter  Medications  . methylPREDNISolone acetate (DEPO-MEDROL) injection 40 mg     Laterality: Bilateral  Location/Site:  L5-S1  Needle size: 22 ga.  Needle type:spinal  Needle Placement: Oblique pedical  Findings:   -Comments: There was excellent flow of contrast along the articular pillars without intravascular flow.  Patient with very tight paraspinal and multifidus musculature with trigger points.  Procedure Details: The fluoroscope beam is vertically oriented in AP and then obliqued 15 to 20 degrees to the ipsilateral side of the desired nerve to achieve the "Scotty dog" appearance.  The skin over the target area of the junction of the superior articulating process and the transverse process (sacral ala if blocking the L5 dorsal rami) was locally anesthetized with a 1 ml volume of 1% Lidocaine without Epinephrine.  The spinal needle was inserted and advanced in a trajectory view down to the target.   After contact with periosteum and negative aspirate for blood and CSF, correct placement without intravascular or epidural spread was confirmed by injecting 0.5 ml. of Isovue-250.  A spot radiograph was obtained of this image.    Next, a 0.5 ml. volume of the injectate described above was injected. The needle was then redirected to the other facet joint nerves mentioned above if  needed.  Prior to the procedure, the patient was given a Pain Diary which was completed for baseline measurements.  After the procedure, the patient rated their pain every 30 minutes and will continue rating at this frequency for a total of 5 hours.  The patient has been asked to complete the Diary and return to [place] by mail, fax or hand delivered as soon as possible.   Additional Comments:  No complications occurred Dressing: 2 x 2 sterile gauze and Band-Aid    Post-procedure details: Patient was observed during the procedure. Post-procedure instructions were reviewed.  Patient left the clinic in stable condition.

## 2019-09-21 NOTE — Progress Notes (Signed)
Jeffrey [name] - 54 y.o. male MRN [id number]  Date of birth: [birth year]  Office Visit Note: Visit Date: 09/20/2019 PCP: Caroline More, DO Referred by: Caroline More, DO  Subjective: Chief Complaint  Patient presents with  . Lower Back - Pain   HPI: Jeffrey [name] is a 54 y.o. male who comes in today For planned second diagnostic medial branch block of L5-S1 facet joints.  Prior block gave him about a week of really good relief of his back pain he was standing more straight and he reports his wife noticed the same thing.  After second block if is beneficial more than 80% relief would look at radiofrequency ablation of the lower facet joints.  He did asked today about a work note concerning lifting.  With his condition of lumbar spine arthritis or spondylosis he may benefit from at least reduced workload of lifting bending and stooping and we wrote a note accordingly.  He will also follow-up with Dr. Eduard Roux in terms of her other work restrictions.  ROS Otherwise per HPI.  Assessment & Plan: Visit Diagnoses:  1. Spondylosis without myelopathy or radiculopathy, lumbar region     Plan: No additional findings.   Meds & Orders:  Meds ordered this encounter  Medications  . methylPREDNISolone acetate (DEPO-MEDROL) injection 40 mg    Orders Placed This Encounter  Procedures  . Facet Injection  . XR C-ARM NO REPORT    Follow-up: Return for Review Pain Diary.   Procedures: No procedures performed  Lumbar Diagnostic Facet Joint Nerve Block with Fluoroscopic Guidance   Patient: Jeffrey [name]      Date of Birth: 07/10/1965 MRN: [id number] PCP: Caroline More, DO      Visit Date: 09/20/2019   Universal Protocol:    Date/Time: 06/24/216:24 AM  Consent Given By: the patient  Position: PRONE  Additional Comments: Vital signs were monitored before and after the procedure. Patient was prepped and draped in the usual sterile fashion. The correct patient, procedure, and  site was verified.   Injection Procedure Details:  Procedure Site One Meds Administered:  Meds ordered this encounter  Medications  . methylPREDNISolone acetate (DEPO-MEDROL) injection 40 mg     Laterality: Bilateral  Location/Site:  L5-S1  Needle size: 22 ga.  Needle type:spinal  Needle Placement: Oblique pedical  Findings:   -Comments: There was excellent flow of contrast along the articular pillars without intravascular flow.  Patient with very tight paraspinal and multifidus musculature with trigger points.  Procedure Details: The fluoroscope beam is vertically oriented in AP and then obliqued 15 to 20 degrees to the ipsilateral side of the desired nerve to achieve the "Scotty dog" appearance.  The skin over the target area of the junction of the superior articulating process and the transverse process (sacral ala if blocking the L5 dorsal rami) was locally anesthetized with a 1 ml volume of 1% Lidocaine without Epinephrine.  The spinal needle was inserted and advanced in a trajectory view down to the target.   After contact with periosteum and negative aspirate for blood and CSF, correct placement without intravascular or epidural spread was confirmed by injecting 0.5 ml. of Isovue-250.  A spot radiograph was obtained of this image.    Next, a 0.5 ml. volume of the injectate described above was injected. The needle was then redirected to the other facet joint nerves mentioned above if needed.  Prior to the procedure, the patient was given a Pain Diary which was completed for baseline  measurements.  After the procedure, the patient rated their pain every 30 minutes and will continue rating at this frequency for a total of 5 hours.  The patient has been asked to complete the Diary and return to [place] by mail, fax or hand delivered as soon as possible.   Additional Comments:  No complications occurred Dressing: 2 x 2 sterile gauze and Band-Aid    Post-procedure  details: Patient was observed during the procedure. Post-procedure instructions were reviewed.  Patient left the clinic in stable condition.    Clinical History: MRI LUMBAR SPINE WITHOUT CONTRAST  TECHNIQUE: Multiplanar, multisequence MR imaging of the lumbar spine was performed. No intravenous contrast was administered.  COMPARISON:  None.  FINDINGS: Segmentation:  Standard.  Alignment:  Physiologic.  Vertebrae:  No fracture, evidence of discitis, or bone lesion.  Other: Ankylosis of the right SI joint. Mild osteoarthritis of the left SI joint.  Conus medullaris and cauda equina: Conus extends to the above the T12 level. Conus and cauda equina appear normal.  Paraspinal and other soft tissues: No acute paraspinal abnormality.  Disc levels:  Disc spaces: Degenerative disease mild disc height loss at L1-2. Disc desiccation at L2-3 and L3-4.  T12-L1: No significant disc bulge. No evidence of neural foraminal stenosis. No central canal stenosis.  L1-L2: Left foraminal/lateral small disc osteophyte complex. No evidence of neural foraminal stenosis. No central canal stenosis.  L2-L3: Large left far lateral disc osteophyte complex. Mild broad-based disc bulge. No evidence of neural foraminal stenosis. No central canal stenosis.  L3-L4: Mild broad-based disc bulge. Mild bilateral facet arthropathy. Prominence of the epidural fat deforming the thecal sac as can be seen with epidural lipomatosis. Mild left foraminal narrowing. No right foraminal narrowing. No central canal stenosis.  L4-L5: Mild broad-based disc bulge. Mild bilateral facet arthropathy. Prominence of the epidural fat deforming the thecal sac as can be seen with epidural lipomatosis. No central canal stenosis. No foraminal stenosis.  L5-S1: No significant disc bulge. No foraminal stenosis. Moderate bilateral facet arthropathy. Prominence of the epidural fat deforming the thecal sac as  can be seen with epidural lipomatosis. No central canal stenosis.  IMPRESSION: 1. At L3-4 there is a mild broad-based disc bulge. Mild bilateral facet arthropathy. Prominence of the epidural fat deforming the thecal sac as can be seen with epidural lipomatosis. Mild left foraminal narrowing. 2. At L4-5 there is a mild broad-based disc bulge. Mild bilateral facet arthropathy. Prominence of the epidural fat deforming the thecal sac as can be seen with epidural lipomatosis. 3. At L1-2 there is a left foraminal/lateral small disc osteophyte complex. 4. At L5-S1 there is moderate bilateral facet arthropathy. Prominence of the epidural fat deforming the thecal sac as can be seen with epidural lipomatosis.   Electronically Signed   By: Kathreen Devoid   On: 07/19/2019 07:42   He reports that he has been smoking cigarettes. He started smoking about 42 years ago. He has a 8.75 pack-year smoking history. He has never used smokeless tobacco. No results for input(s): HGBA1C, LABURIC in the last 8760 hours.  Objective:  VS:  HT:    WT:   BMI:     BP:125/90  HR:(!) 101bpm  TEMP: ( )  RESP:  Physical Exam Constitutional:      General: He is not in acute distress.    Appearance: Normal appearance. He is not ill-appearing.  HENT:     Head: Normocephalic and atraumatic.     Right Ear: External ear normal.  Left Ear: External ear normal.  Eyes:     Extraocular Movements: Extraocular movements intact.  Cardiovascular:     Rate and Rhythm: Normal rate.     Pulses: Normal pulses.  Abdominal:     General: There is no distension.     Palpations: Abdomen is soft.  Musculoskeletal:        General: No tenderness or signs of injury.     Right lower leg: No edema.     Left lower leg: No edema.     Comments: Patient has good distal strength without clonus.  Concordant low back pain with facet loading and extension.  Skin:    Findings: No erythema or rash.  Neurological:     General: No  focal deficit present.     Mental Status: He is alert and oriented to person, place, and time.     Sensory: No sensory deficit.     Motor: No weakness or abnormal muscle tone.     Coordination: Coordination normal.  Psychiatric:        Mood and Affect: Mood normal.        Behavior: Behavior normal.     Ortho Exam  Imaging: XR C-ARM NO REPORT  Result Date: 09/20/2019 Please see Notes tab for imaging impression.   Past Medical/Family/Surgical/Social History: Medications & Allergies reviewed per EMR, new medications updated. Patient Active Problem List   Diagnosis Date Noted  . Chronic bilateral low back pain without sciatica 07/25/2019  . Status post left knee replacement 07/11/2019  . Neurogenic claudication 05/19/2019  . Hyperlipidemia LDL goal <70 07/20/2018  . Status post right knee replacement 07/28/2017  . Preoperative clearance 07/20/2017  . History of pulmonary embolus (PE) 07/20/2017  . Mild CAD 07/20/2017  . Pain in left ankle and joints of left foot 06/21/2017  . S/P TKR (total knee replacement), left 02/10/2017  . Chronic anticoagulation 01/26/2017  . Acute pulmonary embolus ([place])   . Primary osteoarthritis of left knee 07/28/2016  . Chest pain   . Abnormal EKG   . Pulmonary embolism ([place]) 06/03/2016  . Unilateral primary osteoarthritis, right knee 08/08/2015  . Encounter for chronic pain management 04/06/2014  . Tobacco abuse 12/05/2013  . DVT (deep venous thrombosis) ([employer]) 11/28/2013  . Candidate for statin therapy due to risk of future cardiovascular event 05/05/2013  . HTN (hypertension) 05/04/2013  . Bilateral knee pain 05/04/2013  . Lumbar back pain 05/04/2013  . Healthcare maintenance 05/04/2013  . BMI 33.0-33.9,adult 05/04/2013  . Hx of substance abuse ([place]) 05/04/2013   Past Medical History:  Diagnosis Date  . Acute pulmonary embolus (H[facility])    bilateral submassive PE in setting of missing several doses of Xarelto for his DVT  . Arthritis   .  DVT (deep venous thrombosis) ([place]) 11/28/2013   RT LEG  . High cholesterol   . Hypertension   . Marijuana use    Family History  Problem Relation Age of Onset  . Heart disease Other   . Arthritis Other   . Alcohol abuse Mother   . Heart disease Mother   . Depression Mother   . Hypertension Mother   . Kidney disease Mother   . Arthritis Mother   . Clotting disorder Mother   . Arthritis Father   . Alcohol abuse Brother   . Drug abuse Brother   . Sickle cell anemia Brother   . Arthritis Maternal Grandmother   . Heart disease Maternal Grandmother   . Arthritis Maternal Grandfather   .  Heart disease Maternal Grandfather   . Asthma Cousin    Past Surgical History:  Procedure Laterality Date  . HERNIA REPAIR     5364 and 6803; umbilical hernia repair  . KNEE SURGERY Bilateral    Left knee 1993, right knee 2004  . TONSILLECTOMY    . TOTAL KNEE ARTHROPLASTY Left 02/10/2017   Procedure: LEFT TOTAL KNEE ARTHROPLASTY;  Surgeon: Leandrew Koyanagi, MD;  Location: [facility];  Service: Orthopedics;  Laterality: Left;  . TOTAL KNEE ARTHROPLASTY Right 07/28/2017   Procedure: RIGHT TOTAL KNEE ARTHROPLASTY;  Surgeon: Leandrew Koyanagi, MD;  Location: [facility];  Service: Orthopedics;  Laterality: Right;   Social History   Occupational History  . Not on file  Tobacco Use  . Smoking status: Current Every Day Smoker    Packs/day: 0.25    Years: 35.00    Pack years: 8.75    Types: Cigarettes    Start date: 10/07/1976  . Smokeless tobacco: Never Used  . Tobacco comment: Stopped for up to 5 years total - Peak rate 2ppd  Vaping Use  . Vaping Use: Former  Substance and Sexual Activity  . Alcohol use: Yes    Alcohol/week: 12.0 standard drinks    Types: 12 Cans of beer per week    Comment: seldom wine/liquor  . Drug use: Yes    Types: Marijuana, Cocaine    Comment: Cocaine last used in 2004, current marijuana use  . Sexual activity: Yes    Birth control/protection: None    Comment: With wife

## 2019-09-27 ENCOUNTER — Other Ambulatory Visit: Payer: Self-pay | Admitting: Family Medicine

## 2019-09-27 MED FILL — ATORVASTATIN CALCIUM 40 MG: 40 | 30 days supply | Qty: 30 | Fill #2

## 2019-09-27 MED FILL — AMLODIPINE BESYLATE 10 MG T: 10 | 90 days supply | Qty: 90 | Fill #1

## 2019-09-27 MED FILL — LISINOPRIL 10 MG TABS: 10 | 90 days supply | Qty: 90 | Fill #1

## 2019-09-28 ENCOUNTER — Other Ambulatory Visit: Payer: Self-pay | Admitting: Family Medicine

## 2019-09-28 MED FILL — ELIQUIS 5 MG TABLET: 5 | 30 days supply | Qty: 60 | Fill #0

## 2019-09-28 MED FILL — GABAPENTIN 300 MG CAPSULE: 300 | 30 days supply | Qty: 90 | Fill #0

## 2019-09-28 NOTE — Telephone Encounter (Signed)
Will refill, but please have patient schedule annual physical exam at earliest convenience for monitoring of chronic problems. Thank you.

## 2019-09-29 NOTE — Telephone Encounter (Signed)
Appointment made.  .Jacee Enerson R Mechelle Pates, CMA  

## 2019-10-11 ENCOUNTER — Ambulatory Visit: Payer: 59 | Admitting: Family Medicine

## 2019-10-11 NOTE — Progress Notes (Deleted)
    SUBJECTIVE:   Chief compliant/HPI: annual examination  Jeffrey [name] is a 54 y.o. who presents today for an annual exam.   PMH: HTN, DVT/PE on Eliquis, mild CAD, bilateral osteoarthritis of knee s/p bilateral TKR, obesity, chronic lumbar back pain, HLD, neurogenic claudication, tobacco abuse  **UPDATE MEDS***  HTN:    today. Currently on Amlodipine 10mg  QD and lisinopril 10mg  QD. Endorses compliance. Non-smoker/Current everyday smoker***. Denies any chest pain, SOB, vision changes, or headaches.   HLD: Last lipid panel below. Currently on Lipitor 40mg  QD. Endorses compliance. Denies any muscles aches or weakness.  Lab Results  Component Value Date   CHOL 103 10/15/2016   HDL 50 10/15/2016   LDLCALC 40 10/15/2016   TRIG 67 10/15/2016   CHOLHDL 2.1 10/15/2016   Tobacco use: Smoked *** pack/day x *** years (*** pack year history). Does ***not qualify for lung cancer screen at this time. Patient was counseled on the risks of tobacco use and cessation strongly encouraged.  Recommendations; Annual lung cancer screening with low-dose CT to adults aged 66 to 76 years, with a 20 pack-year smoking history, and who currently smoke or have quit within the last 15 years (B recommendation). Screening should be discontinued once a person has not smoked for 15 years, develops a substantially limited life-expectancy, or no longer desires to continue screening.  Health Maintenance: Due for colonoscopy and COVID-19 vaccine   History tabs reviewed and updated.   Review of systems form reviewed and stated in HPI  OBJECTIVE:   There were no vitals taken for this visit.  General: well nourished, well developed, in no acute distress with non-toxic appearance HEENT: normocephalic, atraumatic, moist mucous membranes Neck: supple, non-tender without lymphadenopathy CV: regular rate and rhythm without murmurs, rubs, or gallops, no lower extremity edema Lungs: clear to auscultation  bilaterally with normal work of breathing Abdomen: soft, non-tender, non-distended, no masses or organomegaly palpable, normoactive bowel sounds Skin: warm, dry, no rashes or lesions Extremities: warm and well perfused, normal tone MSK: ROM grossly intact, strength intact, gait normal Neuro: Alert and oriented, speech normal   ASSESSMENT/PLAN:   Annual Physical Exam: Patient here today for annual physical exam.  PMH, surgical history, and social history were reviewed. The following concerns below were discussed.   No problem-specific Assessment & Plan notes found for this encounter.    Annual Examination  See AVS for age appropriate recommendations.  PHQ score ***, reviewed and discussed.  Blood pressure value is *** goal, discussed.   Considered the following screening exams based upon USPSTF recommendations: Diabetes screening: {discussed/ordered:14545} Screening for elevated cholesterol: ordered HIV testing: {discussed/ordered:14545} Hepatitis C: {discussed/ordered:14545} Hepatitis B: {discussed/ordered:14545} Syphilis if at high risk: {discussed/ordered:14545} Reviewed risk factors for latent tuberculosis and {not indicated/requested/declined:14582} Colorectal cancer screening: {crcscreen:23821::"discussed, colonoscopy ordered"} Lung cancer screening: {discussed/declined/written TDVV:61607} See documentation below regarding discussion and indication.  PSA discussed and after engaging in discussion of possible risks, benefits and complications of screening patient elected to ***.   Follow up in 1 year or sooner if indicated.    [name], DO [facility], PGY3 10/11/2019 8:28 AM

## 2019-10-12 MED FILL — AMOXICILLIN 500 MG CAPSULE: 500 | 1 days supply | Qty: 4 | Fill #1

## 2019-10-26 ENCOUNTER — Encounter: Payer: Self-pay | Admitting: Family Medicine

## 2019-10-26 ENCOUNTER — Other Ambulatory Visit: Payer: Self-pay

## 2019-10-26 ENCOUNTER — Ambulatory Visit (INDEPENDENT_AMBULATORY_CARE_PROVIDER_SITE_OTHER): Payer: 59 | Admitting: Family Medicine

## 2019-10-26 VITALS — BP 110/80 | HR 100 | Ht 75.0 in | Wt 258.0 lb

## 2019-10-26 DIAGNOSIS — G8929 Other chronic pain: Secondary | ICD-10-CM

## 2019-10-26 DIAGNOSIS — Z0000 Encounter for general adult medical examination without abnormal findings: Secondary | ICD-10-CM

## 2019-10-26 DIAGNOSIS — Z23 Encounter for immunization: Secondary | ICD-10-CM

## 2019-10-26 DIAGNOSIS — Z6833 Body mass index (BMI) 33.0-33.9, adult: Secondary | ICD-10-CM

## 2019-10-26 DIAGNOSIS — M545 Low back pain: Secondary | ICD-10-CM | POA: Diagnosis not present

## 2019-10-26 DIAGNOSIS — Z114 Encounter for screening for human immunodeficiency virus [HIV]: Secondary | ICD-10-CM | POA: Diagnosis not present

## 2019-10-26 DIAGNOSIS — I10 Essential (primary) hypertension: Secondary | ICD-10-CM

## 2019-10-26 DIAGNOSIS — Z1159 Encounter for screening for other viral diseases: Secondary | ICD-10-CM | POA: Diagnosis not present

## 2019-10-26 DIAGNOSIS — Z1211 Encounter for screening for malignant neoplasm of colon: Secondary | ICD-10-CM

## 2019-10-26 DIAGNOSIS — Z86711 Personal history of pulmonary embolism: Secondary | ICD-10-CM | POA: Diagnosis not present

## 2019-10-26 DIAGNOSIS — Z13220 Encounter for screening for lipoid disorders: Secondary | ICD-10-CM | POA: Diagnosis not present

## 2019-10-26 DIAGNOSIS — Z720 Tobacco use: Secondary | ICD-10-CM | POA: Diagnosis not present

## 2019-10-26 DIAGNOSIS — Z7901 Long term (current) use of anticoagulants: Secondary | ICD-10-CM

## 2019-10-26 DIAGNOSIS — E785 Hyperlipidemia, unspecified: Secondary | ICD-10-CM | POA: Diagnosis not present

## 2019-10-26 LAB — POCT GLYCOSYLATED HEMOGLOBIN (HGB A1C): Hemoglobin A1C: 5.5 % (ref 4.0–5.6)

## 2019-10-26 NOTE — Assessment & Plan Note (Signed)
Follows with orthopedic closely.  Has received multiple steroid injections with some improvement.  Uses cane for stability and endorses his chronic pain causes significant impact on his quality of life.  He is considering surgical correction.  Requested work note to allow him to continue to stay out of work due to pain.  Recommended patient speak to orthopedist for work note given a other ones evaluating his functionality in regards to his low back pain.  If they continue to refuse, I will have patient come back for more thorough exam in order to accurately provide work note.  Patient voiced understanding agreement plan.

## 2019-10-26 NOTE — Assessment & Plan Note (Addendum)
Smoked 5-6 cigs/day x 2.5 years. History of 1PPD x 20 years. (~20 pack year history). Does qualify for lung cancer screen at this time. Patient was counseled on the risks of tobacco use and cessation strongly encouraged. -Declined nicotine replacement - patient currently denied CT lung cancer screening as he does not have a 30 pack year. This is based on old guidelines. Will attempt to reorder next year, sooner if indicated -Patient's goal is to continue to slowly decrease number of cigarettes per day with goal of complete cessation by April 2022.

## 2019-10-26 NOTE — Assessment & Plan Note (Signed)
Chronic and Well controlled on amlodipine and lisinopril.  On statin but a current everyday smoker. -Continue antihypertensive medications as prescribed -Counseling for smoking cessation provided

## 2019-10-26 NOTE — Patient Instructions (Signed)
Thank you for coming to see me today. It was a pleasure to see you.   Please let me know if you have trouble getting a letter from the orthopedics.   We are checking some labs today, I will call you if they are abnormal will send you a MyChart message or a letter if they are normal.  If you do not hear about your labs in the next 2 weeks please let us know.  Please follow-up with me in 1 year.  If you have any questions or concerns, please do not hesitate to call the office at [phone].  Take Care,  Dr. [name], DO Resident Physician [place] [phone]

## 2019-10-26 NOTE — Assessment & Plan Note (Signed)
Tolerating Lipitor 40 mg daily. Follow-up lipid panel Consider increase to 80 mg daily if elevated LDL

## 2019-10-26 NOTE — Assessment & Plan Note (Signed)
Doing well on Eliquis 5 mg twice daily.  Denies any acute bleeding. -Continue as prescribed

## 2019-10-26 NOTE — Progress Notes (Signed)
SUBJECTIVE:   Chief compliant/HPI: annual examination  Jeffrey [name] is a 54 y.o. who presents today for an annual exam.   PMH: HTN, DVT/PT, mild CAD, lumbar back pain, obesity, h/o substance abuse, chronic pain 2/2 chronic bilateral low back pain w/out sciatica, chronic anticoagulation, s/p bilateral knee replacement, HDL (goal <70), neurogenic claudication here for physical.  Acute Concerns: none  HTN:  BP: 110/80 today. Currently on Amlodipine 10mg  QD and Lisinopril 10mg  QD. Endorses compliance. Current everyday smoker. Denies any chest pain, SOB, vision changes, or headaches.   HLD: Last lipid panel below. Currently on Lipitor 40mg  QD. Endorses compliance. Denies any muscles aches or weakness.  Lab Results  Component Value Date   CHOL 103 10/15/2016   HDL 50 10/15/2016   LDLCALC 40 10/15/2016   TRIG 67 10/15/2016   CHOLHDL 2.1 10/15/2016   Tobacco use:  Smoked 5-6 cigs/day x 2.5 years. History of 1PPD x 20 years. (~20 pack year history). Does qualify for lung cancer screen at this time. Patient was counseled on the risks of tobacco use and cessation strongly encouraged.  Health Maintenance: Due for colonoscopy and COVID vaccine  Social History: Alcohol: 1 beer a day (16 oz) Illicit Drugs: Marijuana occasionally Safe at home: yes Depression/Suicidality: denies  Family History of GI: no   History tabs reviewed and updated.   Review of systems form reviewed  OBJECTIVE:   BP 110/80   Pulse 100   Ht 6\' 3"  (1.905 m)   Wt (!) 258 lb (117 kg)   SpO2 98%   BMI 32.25 kg/m   General: Pleasant older male, sitting comfortably in exam chair, well nourished, well developed, in no acute distress with non-toxic appearance HEENT: normocephalic, atraumatic, moist mucous membranes, oropharynx without erythema or exudate, tympanic membranes normal bilaterally Neck: supple, normal ROM CV: regular rate and rhythm without murmurs, rubs, or gallops, no lower extremity  edema, 2+ posterior tibial pulses bilaterally Lungs: clear to auscultation bilaterally with normal work of breathing Abdomen: soft, non-tender, non-distended, normoactive bowel sounds Skin: warm, dry Extremities: warm and well perfused, normal tone MSK: gait antalgic requiring use of cane, walks with slumped position with low back brace Neuro: Alert and oriented, speech normal  ASSESSMENT/PLAN:   Annual Physical Exam: Patient here today for annual physical exam.  PMH, surgical history, and social history were reviewed. The following concerns below were discussed.   HTN (hypertension) Chronic and Well controlled on amlodipine and lisinopril.  On statin but a current everyday smoker. -Continue antihypertensive medications as prescribed -Counseling for smoking cessation provided  Tobacco abuse Smoked 5-6 cigs/day x 2.5 years. History of 1PPD x 20 years. (~20 pack year history). Does qualify for lung cancer screen at this time. Patient was counseled on the risks of tobacco use and cessation strongly encouraged. -Declined nicotine replacement - patient currently denied CT lung cancer screening as he does not have a 30 pack year. This is based on old guidelines. Will attempt to reorder next year, sooner if indicated -Patient's goal is to continue to slowly decrease number of cigarettes per day with goal of complete cessation by April 2022.  Chronic anticoagulation Doing well on Eliquis 5 mg twice daily.  Denies any acute bleeding. -Continue as prescribed  Hyperlipidemia LDL goal <70 Tolerating Lipitor 40 mg daily. Follow-up lipid panel Consider increase to 80 mg daily if elevated LDL  Chronic bilateral low back pain without sciatica Follows with orthopedic closely.  Has received multiple steroid injections with some improvement.  Uses cane for stability and endorses his chronic pain causes significant impact on his quality of life.  He is considering surgical correction.  Requested work  note to allow him to continue to stay out of work due to pain.  Recommended patient speak to orthopedist for work note given a other ones evaluating his functionality in regards to his low back pain.  If they continue to refuse, I will have patient come back for more thorough exam in order to accurately provide work note.  Patient voiced understanding agreement plan.    Annual Examination  See AVS for age appropriate recommendations.  PHQ score 1, reviewed and discussed. Denies depression. Blood pressure value is goal, discussed.   Considered the following screening exams based upon USPSTF recommendations: Diabetes screening: ordered Screening for elevated cholesterol: ordered HIV testing: ordered Hepatitis C: ordered Hepatitis B: ordered Syphilis if at high risk: Not indicated Reviewed risk factors for latent tuberculosis and not indicated Colorectal cancer screening: discussed options, elected for cologuard Lung cancer screening: Ordered. See documentation below regarding discussion and indication.  PSA discussed and after engaging in discussion of possible risks, benefits and complications of screening patient elected to defer. Currently no symptoms and no prostate cancer in family.   Follow up in 1 year or sooner if indicated.   covid vaccine given today Gabapentin pRN  Tobacco: slowly decreasing numebr of cigs. Goal of quiting by April 2022.    Walks with cane improed with leanign forward Works with orthopedic, gets injections. considedring surgery  [name], [place], PGY3 10/26/2019 10:04 AM

## 2019-10-27 ENCOUNTER — Encounter: Payer: Self-pay | Admitting: Family Medicine

## 2019-10-27 LAB — BASIC METABOLIC PANEL
BUN/Creatinine Ratio: 12 (ref 9–20)
BUN: 11 mg/dL (ref 6–24)
CO2: 23 mmol/L (ref 20–29)
Calcium: 8.9 mg/dL (ref 8.7–10.2)
Chloride: 101 mmol/L (ref 96–106)
Creatinine, Ser: 0.94 mg/dL (ref 0.76–1.27)
GFR calc Af Amer: 104 mL/min/{1.73_m2} (ref >59)
GFR calc non Af Amer: 90 mL/min/{1.73_m2} (ref >59)
Glucose: 92 mg/dL (ref 65–99)
Potassium: 4.9 mmol/L (ref 3.5–5.2)
Sodium: 139 mmol/L (ref 134–144)

## 2019-10-27 LAB — LIPID PANEL
Chol/HDL Ratio: 2.5 ratio (ref 0.0–5.0)
Cholesterol, Total: 99 mg/dL — ABNORMAL LOW (ref 100–199)
HDL: 40 mg/dL (ref >39)
LDL Chol Calc (NIH): 38 mg/dL (ref 0–99)
Triglycerides: 115 mg/dL (ref 0–149)
VLDL Cholesterol Cal: 21 mg/dL (ref 5–40)

## 2019-10-27 LAB — HEPATITIS C ANTIBODY: Hep C Virus Ab: 0.1 s/co ratio (ref 0.0–0.9)

## 2019-10-27 LAB — HEPATITIS B SURFACE ANTIGEN: Hepatitis B Surface Ag: NEGATIVE

## 2019-10-27 LAB — HIV ANTIBODY (ROUTINE TESTING W REFLEX): HIV Screen 4th Generation wRfx: NONREACTIVE

## 2019-11-07 ENCOUNTER — Telehealth: Payer: Self-pay | Admitting: Physical Medicine and Rehabilitation

## 2019-11-07 NOTE — Telephone Encounter (Signed)
Ok if helped but will need to talk about RFA

## 2019-11-07 NOTE — Telephone Encounter (Signed)
Bilateral L5-S1 facets on 6/23. Ok to repeat if helped, same problem/side, and no new injury?

## 2019-11-07 NOTE — Telephone Encounter (Signed)
Scheduled for 8/19 with driver.

## 2019-11-07 NOTE — Telephone Encounter (Signed)
Pt would like to schedule an appt with Dr.Newton before the 20th if possible.  [phone]

## 2019-11-16 ENCOUNTER — Encounter: Payer: 59 | Admitting: Physical Medicine and Rehabilitation

## 2019-11-16 ENCOUNTER — Telehealth: Payer: Self-pay | Admitting: Physical Medicine and Rehabilitation

## 2019-11-16 NOTE — Telephone Encounter (Signed)
Please advise 

## 2019-11-16 NOTE — Telephone Encounter (Signed)
See message. Please advise.

## 2019-11-16 NOTE — Telephone Encounter (Signed)
Patient was over 15 minutes late for his appointment and will be rescheduled. He states that he has fallen multiple times recently. He also states that he has a court date tomorrow and needs a letter stating that for the time being he is unable to work. Please advise.

## 2019-11-16 NOTE — Telephone Encounter (Signed)
We have not seen him since April.  Hard to make the decision he cannot work without seeing him back

## 2019-11-16 NOTE — Telephone Encounter (Signed)
I don't know that I can make the call that he is unable to work. I did complete facet injections a while back. He may need to get restrictions from Dr. Edmonia Lynch

## 2019-11-17 ENCOUNTER — Ambulatory Visit: Payer: 59

## 2019-11-17 ENCOUNTER — Other Ambulatory Visit: Payer: Self-pay

## 2019-11-17 ENCOUNTER — Ambulatory Visit (INDEPENDENT_AMBULATORY_CARE_PROVIDER_SITE_OTHER): Payer: 59

## 2019-11-17 DIAGNOSIS — Z23 Encounter for immunization: Secondary | ICD-10-CM | POA: Diagnosis not present

## 2019-11-17 NOTE — Telephone Encounter (Signed)
Called patient. No answer. LMOM .  He needs to come in for an appt with Dr. Erlinda Hong to get updated letter/restrictions etc.

## 2019-11-17 NOTE — Progress Notes (Signed)
   Covid-19 Vaccination Clinic  Name:  Jeffrey [name]    MRN: [id number] DOB: 1965/12/28  11/17/2019  [name] was observed post Covid-19 immunization for 15 minutes without incident. He was provided with Vaccine Information Sheet and instruction to access the V-Safe system.   [name] was instructed to call 911 with any severe reactions post vaccine: Marland Kitchen Difficulty breathing  . Swelling of face and throat  . A fast heartbeat  . A bad rash all over body  . Dizziness and weakness   #2 Covid Vaccine given RD without complications.

## 2019-11-23 ENCOUNTER — Ambulatory Visit: Payer: Self-pay

## 2019-11-23 ENCOUNTER — Other Ambulatory Visit: Payer: Self-pay

## 2019-11-23 ENCOUNTER — Encounter: Payer: Self-pay | Admitting: Physical Medicine and Rehabilitation

## 2019-11-23 ENCOUNTER — Ambulatory Visit (INDEPENDENT_AMBULATORY_CARE_PROVIDER_SITE_OTHER): Payer: 59 | Admitting: Physical Medicine and Rehabilitation

## 2019-11-23 VITALS — BP 145/94 | HR 93

## 2019-11-23 DIAGNOSIS — M545 Low back pain: Secondary | ICD-10-CM

## 2019-11-23 DIAGNOSIS — D1779 Benign lipomatous neoplasm of other sites: Secondary | ICD-10-CM | POA: Diagnosis not present

## 2019-11-23 DIAGNOSIS — M47816 Spondylosis without myelopathy or radiculopathy, lumbar region: Secondary | ICD-10-CM

## 2019-11-23 DIAGNOSIS — G8929 Other chronic pain: Secondary | ICD-10-CM | POA: Diagnosis not present

## 2019-11-23 MED ORDER — METHYLPREDNISOLONE ACETATE 80 MG/ML IJ SUSP
80.0000 mg | Freq: Once | INTRAMUSCULAR | Status: AC
  Administered 2019-11-23: 80 mg

## 2019-11-23 NOTE — Progress Notes (Signed)
Jeffrey [name] - 54 y.o. male MRN [id number]  Date of birth: 1965-07-09  Office Visit Note: Visit Date: 11/23/2019 PCP: Danna Hefty, DO Referred by: Danna Hefty, DO  Subjective: Chief Complaint  Patient presents with   Lower Back - Pain   HPI: Jeffrey [name] is a 54 y.o. male who comes in today At the request of Dr. Eduard Roux for further evaluation management of chronic worsening severe low back pain.  Back pain been going on now for many months.  He recently was followed by his primary care physician [name], DO.  He has been followed in the past from orthopedic standpoint by Dr. Eduard Roux.  Patient's had bilateral knee replacements.  His back pain is across the lower back worse with standing and going from sit to stand.  He does not really get any referrals down into the legs.  No paresthesias.  He has been in physical therapy here at [facility].  Diagnostically he has had relief with double diagnostic medial branch blocks at L5-S1.  We do have that documented.  These helped for several days in fact.  He reportedly 70% relief temporarily.  He denies any focal weakness.  He has no history of prior lumbar surgery.  He has failed all manner of other conservative care including medications and time and exercises.  His case is complicated by lumbar epidural lipomatosis causing some crowding of the canal but again no radicular leg pain from that.  And again diagnostic relief with medial branch blocks of the L5-S1 facet joints.  Review of Systems  Musculoskeletal: Positive for back pain.  All other systems reviewed and are negative.  Otherwise per HPI.  Assessment & Plan: Visit Diagnoses:  1. Spondylosis without myelopathy or radiculopathy, lumbar region   2. Chronic bilateral low back pain without sciatica   3. Epidural lipomatosis     Plan: Findings:  Chronic worsening severe axial low back pain worse with standing going from sit to stand with facet loading.  Pain  seems to be consistent with facet joint arthritis of the lumbar spine.  He does have lumbar epidural lipomatosis causing crowding of the canal.  No radicular complaints.  History complicated by anticoagulation.  Diagnostically he has had relief with double diagnostic medial branch blocks.  I think the next step is to get preauthorization for radiofrequency ablation of the L5-S1 facet joints.  This would hopefully provide up to a year of decent relief.  He will continue with exercises and medication.  Continue follow-up with Dr. Eduard Roux for orthopedic evaluation and continue work restriction.  If he does not get much relief with an ablation procedure then I think unfortunately would need to probably seek out spine surgery evaluation for the lipomatosis although again he has had good diagnostic relief with facet block.  The patient today though is having a great deal of pain and would like to have some attempt at injection more waiting for approval of the ablation.  I did go ahead today and provide therapeutic intra-articular facet joint block at L5-S1.    Meds & Orders:  Meds ordered this encounter  Medications   methylPREDNISolone acetate (DEPO-MEDROL) injection 80 mg    Orders Placed This Encounter  Procedures   Facet Injection   XR C-ARM NO REPORT    Follow-up: Return for Obtain preauthorization for radiofrequency ablation of the L5-S1 facet joint.   Procedures: No procedures performed  Lumbar Facet Joint Intra-Articular Injection(s) with Fluoroscopic Guidance  Patient: Jeffrey [name]      Date of Birth: 29-Sep-1965 MRN: [id number] PCP: Danna Hefty, DO      Visit Date: 11/23/2019   Universal Protocol:    Date/Time: 11/23/2019  Consent Given By: the patient  Position: PRONE   Additional Comments: Vital signs were monitored before and after the procedure. Patient was prepped and draped in the usual sterile fashion. The correct patient, procedure, and site was  verified.   Injection Procedure Details:  Procedure Site One Meds Administered:  Meds ordered this encounter  Medications   methylPREDNISolone acetate (DEPO-MEDROL) injection 80 mg     Laterality: Bilateral  Location/Site:  L5-S1  Needle size: 22 guage  Needle type: Spinal  Needle Placement: Articular  Findings:  -Comments: Excellent flow of contrast producing a partial arthrogram.  Procedure Details: The fluoroscope beam is vertically oriented in AP, and the inferior recess is visualized beneath the lower pole of the inferior apophyseal process, which represents the target point for needle insertion. When direct visualization is difficult the target point is located at the medial projection of the vertebral pedicle. The region overlying each aforementioned target is locally anesthetized with a 1 to 2 ml. volume of 1% Lidocaine without Epinephrine.   The spinal needle was inserted into each of the above mentioned facet joints using biplanar fluoroscopic guidance. A 0.25 to 0.5 ml. volume of Isovue-250 was injected and a partial facet joint arthrogram was obtained. A single spot film was obtained of the resulting arthrogram.    One to 1.25 ml of the steroid/anesthetic solution was then injected into each of the facet joints noted above.   Additional Comments:  The patient tolerated the procedure well Dressing: 2 x 2 sterile gauze and Band-Aid    Post-procedure details: Patient was observed during the procedure. Post-procedure instructions were reviewed.  Patient left the clinic in stable condition.     Clinical History: MRI LUMBAR SPINE WITHOUT CONTRAST  TECHNIQUE: Multiplanar, multisequence MR imaging of the lumbar spine was performed. No intravenous contrast was administered.  COMPARISON:  None.  FINDINGS: Segmentation:  Standard.  Alignment:  Physiologic.  Vertebrae:  No fracture, evidence of discitis, or bone lesion.  Other: Ankylosis of the right  SI joint. Mild osteoarthritis of the left SI joint.  Conus medullaris and cauda equina: Conus extends to the above the T12 level. Conus and cauda equina appear normal.  Paraspinal and other soft tissues: No acute paraspinal abnormality.  Disc levels:  Disc spaces: Degenerative disease mild disc height loss at L1-2. Disc desiccation at L2-3 and L3-4.  T12-L1: No significant disc bulge. No evidence of neural foraminal stenosis. No central canal stenosis.  L1-L2: Left foraminal/lateral small disc osteophyte complex. No evidence of neural foraminal stenosis. No central canal stenosis.  L2-L3: Large left far lateral disc osteophyte complex. Mild broad-based disc bulge. No evidence of neural foraminal stenosis. No central canal stenosis.  L3-L4: Mild broad-based disc bulge. Mild bilateral facet arthropathy. Prominence of the epidural fat deforming the thecal sac as can be seen with epidural lipomatosis. Mild left foraminal narrowing. No right foraminal narrowing. No central canal stenosis.  L4-L5: Mild broad-based disc bulge. Mild bilateral facet arthropathy. Prominence of the epidural fat deforming the thecal sac as can be seen with epidural lipomatosis. No central canal stenosis. No foraminal stenosis.  L5-S1: No significant disc bulge. No foraminal stenosis. Moderate bilateral facet arthropathy. Prominence of the epidural fat deforming the thecal sac as can be seen with epidural lipomatosis. No central  canal stenosis.  IMPRESSION: 1. At L3-4 there is a mild broad-based disc bulge. Mild bilateral facet arthropathy. Prominence of the epidural fat deforming the thecal sac as can be seen with epidural lipomatosis. Mild left foraminal narrowing. 2. At L4-5 there is a mild broad-based disc bulge. Mild bilateral facet arthropathy. Prominence of the epidural fat deforming the thecal sac as can be seen with epidural lipomatosis. 3. At L1-2 there is a left  foraminal/lateral small disc osteophyte complex. 4. At L5-S1 there is moderate bilateral facet arthropathy. Prominence of the epidural fat deforming the thecal sac as can be seen with epidural lipomatosis.   Electronically Signed   By: Kathreen Devoid   On: 07/19/2019 07:42   He reports that he has been smoking cigarettes. He started smoking about 43 years ago. He has a 8.75 pack-year smoking history. He has never used smokeless tobacco.  Recent Labs    10/26/19 0950  HGBA1C 5.5    Objective:  VS:  HT:     WT:    BMI:      BP:(!) 145/94   HR:93bpm   TEMP: ( )   RESP:  Physical Exam Constitutional:      General: He is not in acute distress.    Appearance: Normal appearance. He is not ill-appearing.  HENT:     Head: Normocephalic and atraumatic.     Right Ear: External ear normal.     Left Ear: External ear normal.  Eyes:     Extraocular Movements: Extraocular movements intact.  Cardiovascular:     Rate and Rhythm: Normal rate.     Pulses: Normal pulses.  Abdominal:     General: There is no distension.     Palpations: Abdomen is soft.  Musculoskeletal:        General: No tenderness or signs of injury.     Right lower leg: No edema.     Left lower leg: No edema.     Comments: Patient has good distal strength without clonus. Patient somewhat slow to rise from a seated position to full extension.  There is concordant low back pain with facet loading and lumbar spine extension rotation.  There are no definitive trigger points but the patient is somewhat tender across the lower back and PSIS.  There is no pain with hip rotation.  Skin:    Findings: No erythema or rash.  Neurological:     General: No focal deficit present.     Mental Status: He is alert and oriented to person, place, and time.     Sensory: No sensory deficit.     Motor: No weakness or abnormal muscle tone.     Coordination: Coordination normal.  Psychiatric:        Mood and Affect: Mood normal.         Behavior: Behavior normal.     Ortho Exam  Imaging: XR C-ARM NO REPORT  Result Date: 11/23/2019 Please see Notes tab for imaging impression.   Past Medical/Family/Surgical/Social History: Medications & Allergies reviewed per EMR, new medications updated. Patient Active Problem List   Diagnosis Date Noted   Chronic bilateral low back pain without sciatica 07/25/2019   Neurogenic claudication 05/19/2019   Hyperlipidemia LDL goal <70 07/20/2018   History of pulmonary embolus (PE) 07/20/2017   Mild CAD 07/20/2017   S/p total knee replacement, bilateral 02/10/2017   Chronic anticoagulation 01/26/2017   Pulmonary embolism ([place]) 06/03/2016   Encounter for chronic pain management 04/06/2014   Tobacco abuse 12/05/2013  DVT (deep venous thrombosis) ([place]) 11/28/2013   HTN (hypertension) 05/04/2013   Lumbar back pain 05/04/2013   BMI 33.0-33.9,adult 05/04/2013   Hx of substance abuse ([place]) 05/04/2013   Past Medical History:  Diagnosis Date   Acute pulmonary embolus (HCC)    bilateral submassive PE in setting of missing several doses of Xarelto for his DVT   Arthritis    DVT (deep venous thrombosis) ([place]) 11/28/2013   RT LEG   High cholesterol    Hypertension    Marijuana use    Family History  Problem Relation Age of Onset   Heart disease Other    Arthritis Other    Alcohol abuse Mother    Heart disease Mother    Depression Mother    Hypertension Mother    Kidney disease Mother    Arthritis Mother    Clotting disorder Mother    Arthritis Father    Alcohol abuse Brother    Drug abuse Brother    Sickle cell anemia Brother    Arthritis Maternal Grandmother    Heart disease Maternal Grandmother    Arthritis Maternal Grandfather    Heart disease Maternal Grandfather    Asthma Cousin    Past Surgical History:  Procedure Laterality Date   HERNIA REPAIR     7048 and 8891; umbilical hernia repair   KNEE SURGERY Bilateral    Left  knee 1993, right knee 2004   TONSILLECTOMY     TOTAL KNEE ARTHROPLASTY Left 02/10/2017   Procedure: LEFT TOTAL KNEE ARTHROPLASTY;  Surgeon: Leandrew Koyanagi, MD;  Location: [facility];  Service: Orthopedics;  Laterality: Left;   TOTAL KNEE ARTHROPLASTY Right 07/28/2017   Procedure: RIGHT TOTAL KNEE ARTHROPLASTY;  Surgeon: Leandrew Koyanagi, MD;  Location: [facility];  Service: Orthopedics;  Laterality: Right;   Social History   Occupational History   Not on file  Tobacco Use   Smoking status: Current Every Day Smoker    Packs/day: 0.25    Years: 35.00    Pack years: 8.75    Types: Cigarettes    Start date: 10/07/1976   Smokeless tobacco: Never Used   Tobacco comment: Stopped for up to 5 years total - Peak rate 2ppd  Vaping Use   Vaping Use: Former  Substance and Sexual Activity   Alcohol use: Yes    Alcohol/week: 12.0 standard drinks    Types: 12 Cans of beer per week    Comment: seldom wine/liquor   Drug use: Yes    Types: Marijuana, Cocaine    Comment: Cocaine last used in 2004, current marijuana use   Sexual activity: Yes    Birth control/protection: None    Comment: With wife

## 2019-11-23 NOTE — Progress Notes (Signed)
Pt state lower back pain. Pt states walking makes the pain worse. Pt state sitting help ease sum of the pain. Pt has hx of inj on 09/20/19 it was good but than after two weeks the pain return.  Numeric Pain Rating Scale and Functional Assessment Average Pain 5   In the last MONTH (on 0-10 scale) has pain interfered with the following?  1. General activity like being  able to carry out your everyday physical activities such as walking, climbing stairs, carrying groceries, or moving a chair?  Rating(7)   +Driver, +BT, -Dye Allergies.

## 2019-11-24 ENCOUNTER — Encounter: Payer: Self-pay | Admitting: Physical Medicine and Rehabilitation

## 2019-11-24 NOTE — Procedures (Signed)
Lumbar Facet Joint Intra-Articular Injection(s) with Fluoroscopic Guidance  Patient: Jeffrey [name]      Date of Birth: 09-02-1965 MRN: [id number] PCP: Danna Hefty, DO      Visit Date: 11/23/2019   Universal Protocol:    Date/Time: 11/23/2019  Consent Given By: the patient  Position: PRONE   Additional Comments: Vital signs were monitored before and after the procedure. Patient was prepped and draped in the usual sterile fashion. The correct patient, procedure, and site was verified.   Injection Procedure Details:  Procedure Site One Meds Administered:  Meds ordered this encounter  Medications  . methylPREDNISolone acetate (DEPO-MEDROL) injection 80 mg     Laterality: Bilateral  Location/Site:  L5-S1  Needle size: 22 guage  Needle type: Spinal  Needle Placement: Articular  Findings:  -Comments: Excellent flow of contrast producing a partial arthrogram.  Procedure Details: The fluoroscope beam is vertically oriented in AP, and the inferior recess is visualized beneath the lower pole of the inferior apophyseal process, which represents the target point for needle insertion. When direct visualization is difficult the target point is located at the medial projection of the vertebral pedicle. The region overlying each aforementioned target is locally anesthetized with a 1 to 2 ml. volume of 1% Lidocaine without Epinephrine.   The spinal needle was inserted into each of the above mentioned facet joints using biplanar fluoroscopic guidance. A 0.25 to 0.5 ml. volume of Isovue-250 was injected and a partial facet joint arthrogram was obtained. A single spot film was obtained of the resulting arthrogram.    One to 1.25 ml of the steroid/anesthetic solution was then injected into each of the facet joints noted above.   Additional Comments:  The patient tolerated the procedure well Dressing: 2 x 2 sterile gauze and Band-Aid    Post-procedure details: Patient was  observed during the procedure. Post-procedure instructions were reviewed.  Patient left the clinic in stable condition.

## 2019-11-28 ENCOUNTER — Ambulatory Visit (INDEPENDENT_AMBULATORY_CARE_PROVIDER_SITE_OTHER): Payer: 59 | Admitting: Orthopaedic Surgery

## 2019-11-28 DIAGNOSIS — G8929 Other chronic pain: Secondary | ICD-10-CM | POA: Diagnosis not present

## 2019-11-28 DIAGNOSIS — M545 Low back pain: Secondary | ICD-10-CM | POA: Diagnosis not present

## 2019-11-28 NOTE — Progress Notes (Signed)
Office Visit Note   Patient: Jeffrey [name]           Date of Birth: 1966-01-03           MRN: [id number] Visit Date: 11/28/2019              Requested by: Danna Hefty, DO [address],  [place] [address] PCP: Danna Hefty, DO   Assessment & Plan: Visit Diagnoses:  1. Chronic bilateral low back pain without sciatica     Plan: Work note was renewed today for no lifting bending prolonged standing walking climbing.  He does have severe spinal stenosis and this is going to be an ongoing issue therefore I feel that he will need this restriction indefinitely.  Follow-Up Instructions: Return if symptoms worsen or fail to improve.   Orders:  No orders of the defined types were placed in this encounter.  No orders of the defined types were placed in this encounter.     Procedures: No procedures performed   Clinical Data: No additional findings.   Subjective: No chief complaint on file.   [name] comes in today for follow-up of low back pain.  He has spinal stenosis.  Denies any changes in medical history.  He continues to have periodic injections with Dr. Ernestina Patches.   Review of Systems   Objective: Vital Signs: There were no vitals taken for this visit.  Physical Exam  Ortho Exam Exam is unchanged. Specialty Comments:  No specialty comments available.  Imaging: No results found.   PMFS History: Patient Active Problem List   Diagnosis Date Noted  . Chronic bilateral low back pain without sciatica 07/25/2019  . Neurogenic claudication 05/19/2019  . Hyperlipidemia LDL goal <70 07/20/2018  . History of pulmonary embolus (PE) 07/20/2017  . Mild CAD 07/20/2017  . S/p total knee replacement, bilateral 02/10/2017  . Chronic anticoagulation 01/26/2017  . Pulmonary embolism ([place]) 06/03/2016  . Encounter for chronic pain management 04/06/2014  . Tobacco abuse 12/05/2013  . DVT (deep venous thrombosis) ([place]) 11/28/2013  . HTN  (hypertension) 05/04/2013  . Lumbar back pain 05/04/2013  . BMI 33.0-33.9,adult 05/04/2013  . Hx of substance abuse ([place]) 05/04/2013   Past Medical History:  Diagnosis Date  . Acute pulmonary embolus (HCC)    bilateral submassive PE in setting of missing several doses of Xarelto for his DVT  . Arthritis   . DVT (deep venous thrombosis) ([place]) 11/28/2013   RT LEG  . High cholesterol   . Hypertension   . Marijuana use     Family History  Problem Relation Age of Onset  . Heart disease Other   . Arthritis Other   . Alcohol abuse Mother   . Heart disease Mother   . Depression Mother   . Hypertension Mother   . Kidney disease Mother   . Arthritis Mother   . Clotting disorder Mother   . Arthritis Father   . Alcohol abuse Brother   . Drug abuse Brother   . Sickle cell anemia Brother   . Arthritis Maternal Grandmother   . Heart disease Maternal Grandmother   . Arthritis Maternal Grandfather   . Heart disease Maternal Grandfather   . Asthma Cousin     Past Surgical History:  Procedure Laterality Date  . HERNIA REPAIR     0160 and 1093; umbilical hernia repair  . KNEE SURGERY Bilateral    Left knee 1993, right knee 2004  . TONSILLECTOMY    .  TOTAL KNEE ARTHROPLASTY Left 02/10/2017   Procedure: LEFT TOTAL KNEE ARTHROPLASTY;  Surgeon: Leandrew Koyanagi, MD;  Location: [facility];  Service: Orthopedics;  Laterality: Left;  . TOTAL KNEE ARTHROPLASTY Right 07/28/2017   Procedure: RIGHT TOTAL KNEE ARTHROPLASTY;  Surgeon: Leandrew Koyanagi, MD;  Location: [facility];  Service: Orthopedics;  Laterality: Right;   Social History   Occupational History  . Not on file  Tobacco Use  . Smoking status: Current Every Day Smoker    Packs/day: 0.25    Years: 35.00    Pack years: 8.75    Types: Cigarettes    Start date: 10/07/1976  . Smokeless tobacco: Never Used  . Tobacco comment: Stopped for up to 5 years total - Peak rate 2ppd  Vaping Use  . Vaping Use: Former  Substance and Sexual Activity  .  Alcohol use: Yes    Alcohol/week: 12.0 standard drinks    Types: 12 Cans of beer per week    Comment: seldom wine/liquor  . Drug use: Yes    Types: Marijuana, Cocaine    Comment: Cocaine last used in 2004, current marijuana use  . Sexual activity: Yes    Birth control/protection: None    Comment: With wife

## 2019-11-30 ENCOUNTER — Telehealth: Payer: Self-pay | Admitting: Physical Medicine and Rehabilitation

## 2019-11-30 NOTE — Telephone Encounter (Signed)
Doyou know the status on the authorization for this?

## 2019-11-30 NOTE — Telephone Encounter (Signed)
-----   Message from Magnus Sinning, MD sent at 11/24/2019  8:18 AM EDT ----- Regarding: needs pre-auth for RFA see notes

## 2019-12-01 NOTE — Telephone Encounter (Signed)
Called pt and informed him that his insurance is still pending.

## 2020-01-04 ENCOUNTER — Encounter: Payer: Self-pay | Admitting: Physical Medicine and Rehabilitation

## 2020-01-04 ENCOUNTER — Ambulatory Visit (INDEPENDENT_AMBULATORY_CARE_PROVIDER_SITE_OTHER): Payer: 59 | Admitting: Physical Medicine and Rehabilitation

## 2020-01-04 ENCOUNTER — Other Ambulatory Visit: Payer: Self-pay | Admitting: Family Medicine

## 2020-01-04 ENCOUNTER — Ambulatory Visit: Payer: Self-pay

## 2020-01-04 ENCOUNTER — Other Ambulatory Visit: Payer: Self-pay

## 2020-01-04 VITALS — BP 131/93 | HR 101

## 2020-01-04 DIAGNOSIS — G8929 Other chronic pain: Secondary | ICD-10-CM

## 2020-01-04 DIAGNOSIS — M47816 Spondylosis without myelopathy or radiculopathy, lumbar region: Secondary | ICD-10-CM

## 2020-01-04 DIAGNOSIS — M545 Low back pain, unspecified: Secondary | ICD-10-CM

## 2020-01-04 MED ORDER — CYCLOBENZAPRINE HCL 10 MG PO TABS: 10.0000 mg | ORAL_TABLET | Freq: Every day | ORAL | 1 refills | Status: DC

## 2020-01-04 MED ORDER — METHYLPREDNISOLONE ACETATE 80 MG/ML IJ SUSP
80.0000 mg | Freq: Once | INTRAMUSCULAR | Status: AC
  Administered 2020-01-04: 80 mg

## 2020-01-04 MED ORDER — ATORVASTATIN CALCIUM 40 MG PO TABS
40.0000 mg | ORAL_TABLET | Freq: Every day | ORAL | 2 refills | Status: DC
Start: 2020-01-04 — End: 2020-01-04

## 2020-01-04 MED FILL — CYCLOBENZAPRINE HCL 10 MG T: 10 | 15 days supply | Qty: 30 | Fill #0

## 2020-01-04 MED FILL — ELIQUIS 5 MG TABLET: 5 | 30 days supply | Qty: 60 | Fill #1

## 2020-01-04 NOTE — Progress Notes (Signed)
Pt state lower back pain. Pt state standing for long period of time makes the pain worse. Pt state he sit down to ease the pain. Pt has hx of inj on 11/23/19 pt state it was okay but it just didn't last long.  Numeric Pain Rating Scale and Functional Assessment Average Pain 7   In the last MONTH (on 0-10 scale) has pain interfered with the following?  1. General activity like being  able to carry out your everyday physical activities such as walking, climbing stairs, carrying groceries, or moving a chair?  Rating(7)   +Driver, +BT, -Dye Allergies.

## 2020-01-04 NOTE — Procedures (Signed)
Lumbar Facet Joint Nerve Denervation  Patient: Jeffrey [name]      Date of Birth: Mar 27, 1966 MRN: [id number] PCP: Danna Hefty, DO      Visit Date: 01/04/2020   Universal Protocol:    Date/Time: 01/03/2109:50 AM  Consent Given By: the patient  Position: PRONE  Additional Comments: Vital signs were monitored before and after the procedure. Patient was prepped and draped in the usual sterile fashion. The correct patient, procedure, and site was verified.   Injection Procedure Details:   Procedure diagnoses:  1. Spondylosis without myelopathy or radiculopathy, lumbar region   2. Chronic bilateral low back pain without sciatica      Meds Administered:  Meds ordered this encounter  Medications  . methylPREDNISolone acetate (DEPO-MEDROL) injection 80 mg  . cyclobenzaprine (FLEXERIL) 10 MG tablet    Sig: Take 1-2 tablets (10-20 mg total) by mouth at bedtime.    Dispense:  30 tablet    Refill:  1     Laterality: Bilateral  Location/Site:  L5-S1  Needle size: 18 G  Needle type: Radiofrequency cannula  Needle Placement: Along juncture of superior articular process and transverse pocess  Findings:  -Comments:  Procedure Details: For each desired target nerve, the corresponding transverse process (sacral ala for the L5 dorsal rami) was identified and the fluoroscope was positioned to square off the endplates of the corresponding vertebral body to achieve a true AP midline view.  The beam was then obliqued 15 to 20 degrees and caudally tilted 15 to 20 degrees to line up a trajectory along the target nerves. The skin over the target of the junction of superior articulating process and transverse process (sacral ala for the L5 dorsal rami) was infiltrated with 30ml of 1% Lidocaine without Epinephrine.  The 18 gauge 62mm active tip outer cannula was advanced in trajectory view to the target.  This procedure was repeated for each target nerve.  Then, for all levels, the  outer cannula placement was fine-tuned and the position was then confirmed with bi-planar imaging.    Test stimulation was done both at sensory and motor levels to ensure there was no radicular stimulation. The target tissues were then infiltrated with 1 ml of 1% Lidocaine without Epinephrine. Subsequently, a percutaneous neurotomy was carried out for 90 seconds at 80 degrees Celsius.  After the completion of the lesion, 1 ml of injectate was delivered. It was then repeated for each facet joint nerve mentioned above. Appropriate radiographs were obtained to verify the probe placement during the neurotomy.   Additional Comments:  The patient tolerated the procedure well Dressing: 2 x 2 sterile gauze and Band-Aid    Post-procedure details: Patient was observed during the procedure. Post-procedure instructions were reviewed.  Patient left the clinic in stable condition.

## 2020-01-04 NOTE — Progress Notes (Signed)
AC COLAN - 54 y.o. male MRN [id number]  Date of birth: June 17, 1965  Office Visit Note: Visit Date: 01/04/2020 PCP: Danna Hefty, DO Referred by: Danna Hefty, DO  Subjective: Chief Complaint  Patient presents with  . Lower Back - Pain   HPI:  SIMRAN MANNIS is a 54 y.o. male who comes in today for planned radiofrequency ablation of the Bilateral L5-S1 Lumbar facet joints. This would be ablation of the corresponding medial branches and/or dorsal rami.  Patient has had double diagnostic blocks with more than 50% relief.  These are documented on pain diary.  They have had chronic back pain for quite some time, more than 3 months, which has been an ongoing situation with recalcitrant axial back pain.  They have no radicular pain.  Their axial pain is worse with standing and ambulating and on exam today with facet loading.  They have had physical therapy as well as home exercise program.  The imaging noted in the chart below indicated facet pathology. Accordingly they meet all the criteria and qualification for for radiofrequency ablation and we are going to complete this today hopefully for more longer term relief as part of comprehensive management program.  Referring: Eduard Roux, MD  ROS Otherwise per HPI.  Assessment & Plan: Visit Diagnoses:  1. Spondylosis without myelopathy or radiculopathy, lumbar region   2. Chronic bilateral low back pain without sciatica     Plan: No additional findings.   Meds & Orders:  Meds ordered this encounter  Medications  . methylPREDNISolone acetate (DEPO-MEDROL) injection 80 mg  . cyclobenzaprine (FLEXERIL) 10 MG tablet    Sig: Take 1-2 tablets (10-20 mg total) by mouth at bedtime.    Dispense:  30 tablet    Refill:  1    Orders Placed This Encounter  Procedures  . Radiofrequency,Lumbar  . XR C-ARM NO REPORT    Follow-up: Return if symptoms worsen or fail to improve.   Procedures: No procedures performed  Lumbar Facet  Joint Nerve Denervation  Patient: TRAYLON SCHIMMING      Date of Birth: 1965-07-20 MRN: [id number] PCP: Danna Hefty, DO      Visit Date: 01/04/2020   Universal Protocol:    Date/Time: 01/03/2109:50 AM  Consent Given By: the patient  Position: PRONE  Additional Comments: Vital signs were monitored before and after the procedure. Patient was prepped and draped in the usual sterile fashion. The correct patient, procedure, and site was verified.   Injection Procedure Details:   Procedure diagnoses:  1. Spondylosis without myelopathy or radiculopathy, lumbar region   2. Chronic bilateral low back pain without sciatica      Meds Administered:  Meds ordered this encounter  Medications  . methylPREDNISolone acetate (DEPO-MEDROL) injection 80 mg  . cyclobenzaprine (FLEXERIL) 10 MG tablet    Sig: Take 1-2 tablets (10-20 mg total) by mouth at bedtime.    Dispense:  30 tablet    Refill:  1     Laterality: Bilateral  Location/Site:  L5-S1  Needle size: 18 G  Needle type: Radiofrequency cannula  Needle Placement: Along juncture of superior articular process and transverse pocess  Findings:  -Comments:  Procedure Details: For each desired target nerve, the corresponding transverse process (sacral ala for the L5 dorsal rami) was identified and the fluoroscope was positioned to square off the endplates of the corresponding vertebral body to achieve a true AP midline view.  The beam was then obliqued 15 to 20 degrees  and caudally tilted 15 to 20 degrees to line up a trajectory along the target nerves. The skin over the target of the junction of superior articulating process and transverse process (sacral ala for the L5 dorsal rami) was infiltrated with 53ml of 1% Lidocaine without Epinephrine.  The 18 gauge 71mm active tip outer cannula was advanced in trajectory view to the target.  This procedure was repeated for each target nerve.  Then, for all levels, the outer cannula  placement was fine-tuned and the position was then confirmed with bi-planar imaging.    Test stimulation was done both at sensory and motor levels to ensure there was no radicular stimulation. The target tissues were then infiltrated with 1 ml of 1% Lidocaine without Epinephrine. Subsequently, a percutaneous neurotomy was carried out for 90 seconds at 80 degrees Celsius.  After the completion of the lesion, 1 ml of injectate was delivered. It was then repeated for each facet joint nerve mentioned above. Appropriate radiographs were obtained to verify the probe placement during the neurotomy.   Additional Comments:  The patient tolerated the procedure well Dressing: 2 x 2 sterile gauze and Band-Aid    Post-procedure details: Patient was observed during the procedure. Post-procedure instructions were reviewed.  Patient left the clinic in stable condition.       Clinical History: MRI LUMBAR SPINE WITHOUT CONTRAST  TECHNIQUE: Multiplanar, multisequence MR imaging of the lumbar spine was performed. No intravenous contrast was administered.  COMPARISON:  None.  FINDINGS: Segmentation:  Standard.  Alignment:  Physiologic.  Vertebrae:  No fracture, evidence of discitis, or bone lesion.  Other: Ankylosis of the right SI joint. Mild osteoarthritis of the left SI joint.  Conus medullaris and cauda equina: Conus extends to the above the T12 level. Conus and cauda equina appear normal.  Paraspinal and other soft tissues: No acute paraspinal abnormality.  Disc levels:  Disc spaces: Degenerative disease mild disc height loss at L1-2. Disc desiccation at L2-3 and L3-4.  T12-L1: No significant disc bulge. No evidence of neural foraminal stenosis. No central canal stenosis.  L1-L2: Left foraminal/lateral small disc osteophyte complex. No evidence of neural foraminal stenosis. No central canal stenosis.  L2-L3: Large left far lateral disc osteophyte complex.  Mild broad-based disc bulge. No evidence of neural foraminal stenosis. No central canal stenosis.  L3-L4: Mild broad-based disc bulge. Mild bilateral facet arthropathy. Prominence of the epidural fat deforming the thecal sac as can be seen with epidural lipomatosis. Mild left foraminal narrowing. No right foraminal narrowing. No central canal stenosis.  L4-L5: Mild broad-based disc bulge. Mild bilateral facet arthropathy. Prominence of the epidural fat deforming the thecal sac as can be seen with epidural lipomatosis. No central canal stenosis. No foraminal stenosis.  L5-S1: No significant disc bulge. No foraminal stenosis. Moderate bilateral facet arthropathy. Prominence of the epidural fat deforming the thecal sac as can be seen with epidural lipomatosis. No central canal stenosis.  IMPRESSION: 1. At L3-4 there is a mild broad-based disc bulge. Mild bilateral facet arthropathy. Prominence of the epidural fat deforming the thecal sac as can be seen with epidural lipomatosis. Mild left foraminal narrowing. 2. At L4-5 there is a mild broad-based disc bulge. Mild bilateral facet arthropathy. Prominence of the epidural fat deforming the thecal sac as can be seen with epidural lipomatosis. 3. At L1-2 there is a left foraminal/lateral small disc osteophyte complex. 4. At L5-S1 there is moderate bilateral facet arthropathy. Prominence of the epidural fat deforming the thecal sac as can be  seen with epidural lipomatosis.   Electronically Signed   By: Kathreen Devoid   On: 07/19/2019 07:42     Objective:  VS:  HT:    WT:   BMI:     BP:(!) 131/93  HR:(!) 101bpm  TEMP: ( )  RESP:  Physical Exam Constitutional:      General: He is not in acute distress.    Appearance: Normal appearance. He is not ill-appearing.  HENT:     Head: Normocephalic and atraumatic.     Right Ear: External ear normal.     Left Ear: External ear normal.  Eyes:     Extraocular Movements:  Extraocular movements intact.  Cardiovascular:     Rate and Rhythm: Normal rate.     Pulses: Normal pulses.  Abdominal:     General: There is no distension.     Palpations: Abdomen is soft.  Musculoskeletal:        General: No tenderness or signs of injury.     Right lower leg: No edema.     Left lower leg: No edema.     Comments: Patient has good distal strength without clonus.Patient somewhat slow to rise from a seated position to full extension.  There is concordant low back pain with facet loading and lumbar spine extension rotation.  There are no definitive trigger points but the patient is somewhat tender across the lower back and PSIS.  There is no pain with hip rotation.   Skin:    Findings: No erythema or rash.  Neurological:     General: No focal deficit present.     Mental Status: He is alert and oriented to person, place, and time.     Sensory: No sensory deficit.     Motor: No weakness or abnormal muscle tone.     Coordination: Coordination normal.  Psychiatric:        Mood and Affect: Mood normal.        Behavior: Behavior normal.      Imaging: No results found.

## 2020-01-05 MED FILL — ATORVASTATIN 40 MG TABLET: 40 | 90 days supply | Qty: 90 | Fill #0

## 2020-01-19 MED FILL — ATORVASTATIN 40 MG TABLET: 40 | 90 days supply | Qty: 90 | Fill #0

## 2020-02-14 ENCOUNTER — Telehealth: Payer: Self-pay

## 2020-02-14 NOTE — Telephone Encounter (Signed)
Cologuard recall letter is being mailed to home address on file

## 2020-02-29 MED FILL — ELIQUIS 5 MG TABLET: 5 | 30 days supply | Qty: 60 | Fill #2

## 2020-04-30 ENCOUNTER — Encounter: Payer: Self-pay | Admitting: Gastroenterology

## 2020-07-10 ENCOUNTER — Ambulatory Visit: Payer: 59 | Admitting: Orthopaedic Surgery

## 2020-07-23 ENCOUNTER — Ambulatory Visit: Payer: 59 | Admitting: Orthopaedic Surgery

## 2020-08-06 ENCOUNTER — Ambulatory Visit: Payer: 59 | Admitting: Orthopaedic Surgery

## 2020-10-17 ENCOUNTER — Telehealth: Payer: Self-pay

## 2020-10-17 NOTE — Telephone Encounter (Signed)
Pt called and would like to set up an appt with Dr. Ernestina Patches

## 2020-10-17 NOTE — Telephone Encounter (Signed)
Pt called asking Sunday [facility] try him again @ 1:30 today; he'll be free then.   [phone]

## 2020-10-18 ENCOUNTER — Telehealth: Payer: Self-pay | Admitting: Physical Medicine and Rehabilitation

## 2020-10-18 NOTE — Telephone Encounter (Signed)
Patient returned call asked for a call back.  [phone]

## 2020-10-18 NOTE — Telephone Encounter (Signed)
Pt called requesting a call back from [place] or [facility] to set appt  for his back pains. Please call pt at [phone].

## 2020-10-21 ENCOUNTER — Telehealth: Payer: Self-pay

## 2020-10-21 NOTE — Telephone Encounter (Signed)
Pt called wanting another inj, last inj was on 01/17/20 Bil L5-S1 Facets. Please advise

## 2020-10-25 NOTE — Telephone Encounter (Signed)
Pt called stating he missed a call and would like to be tried again.   [phone]

## 2020-11-13 ENCOUNTER — Encounter: Payer: Self-pay | Admitting: Physical Medicine and Rehabilitation

## 2020-11-13 ENCOUNTER — Ambulatory Visit: Payer: Self-pay

## 2020-11-13 ENCOUNTER — Other Ambulatory Visit: Payer: Self-pay

## 2020-11-13 ENCOUNTER — Ambulatory Visit (INDEPENDENT_AMBULATORY_CARE_PROVIDER_SITE_OTHER): Payer: 59 | Admitting: Physical Medicine and Rehabilitation

## 2020-11-13 VITALS — BP 154/99 | HR 84

## 2020-11-13 DIAGNOSIS — M47816 Spondylosis without myelopathy or radiculopathy, lumbar region: Secondary | ICD-10-CM

## 2020-11-13 MED ORDER — BETAMETHASONE SOD PHOS & ACET 6 (3-3) MG/ML IJ SUSP
12.0000 mg | Freq: Once | INTRAMUSCULAR | Status: AC
  Administered 2020-11-13: 12 mg

## 2020-11-13 NOTE — Progress Notes (Signed)
Pt state lower back that travels down both legs. Pt state walking, standing and laying down makes the pain worse. Pt state he takes pain meds to help ease his pain.  Numeric Pain Rating Scale and Functional Assessment Average Pain 9   In the last MONTH (on 0-10 scale) has pain interfered with the following?  1. General activity like being  able to carry out your everyday physical activities such as walking, climbing stairs, carrying groceries, or moving a chair?  Rating(10)   +Driver, -BT, -Dye Allergies.

## 2020-11-13 NOTE — Patient Instructions (Signed)

## 2020-11-26 ENCOUNTER — Other Ambulatory Visit: Payer: Self-pay | Admitting: Family Medicine

## 2020-11-26 ENCOUNTER — Other Ambulatory Visit (HOSPITAL_COMMUNITY): Payer: Self-pay

## 2020-11-26 ENCOUNTER — Other Ambulatory Visit: Payer: Self-pay | Admitting: Physician Assistant

## 2020-11-26 DIAGNOSIS — I10 Essential (primary) hypertension: Secondary | ICD-10-CM

## 2020-11-26 MED ORDER — LISINOPRIL 10 MG PO TABS
10.0000 mg | ORAL_TABLET | Freq: Every day | ORAL | 0 refills | Status: DC
  Filled 2020-11-26: qty 15, 15d supply, fill #0

## 2020-11-27 ENCOUNTER — Telehealth: Payer: Self-pay

## 2020-11-27 ENCOUNTER — Other Ambulatory Visit (HOSPITAL_COMMUNITY): Payer: Self-pay

## 2020-11-27 MED ORDER — AMLODIPINE BESYLATE 10 MG PO TABS
10.0000 mg | ORAL_TABLET | Freq: Every day | ORAL | 1 refills | Status: DC
  Filled 2020-11-27: qty 30, 30d supply, fill #0
  Filled 2021-03-04: qty 30, 30d supply, fill #1

## 2020-11-27 MED ORDER — APIXABAN 5 MG PO TABS
5.0000 mg | ORAL_TABLET | Freq: Two times a day (BID) | ORAL | 0 refills | Status: DC
Start: 2020-11-27 — End: 2021-03-04
  Filled 2020-11-27 – 2020-11-28 (×4): qty 60, 30d supply, fill #0

## 2020-11-27 NOTE — Telephone Encounter (Signed)
-----   Message from Holley Bouche, MD sent at 11/27/2020  3:56 PM EDT ----- Regarding: Appointment for yearly [facility] Team,  Can we get this patient scheduled for a phsycal/check up? It looks like they haven't been seen in about a year.   Thank you for your help  -BS

## 2020-11-27 NOTE — Telephone Encounter (Signed)
Called patient and LVM asking patient to call office and schedule an appointment.  Ozella Almond, [place]

## 2020-11-28 ENCOUNTER — Other Ambulatory Visit (HOSPITAL_COMMUNITY): Payer: Self-pay

## 2020-11-28 NOTE — Telephone Encounter (Signed)
Patient returns call to nurse line. Patient scheduled for October.

## 2020-11-29 ENCOUNTER — Other Ambulatory Visit (HOSPITAL_COMMUNITY): Payer: Self-pay

## 2020-11-29 MED FILL — Atorvastatin Calcium Tab 40 MG (Base Equivalent): ORAL | 90 days supply | Qty: 90 | Fill #0 | Status: AC

## 2020-12-03 NOTE — Progress Notes (Signed)
Jeffrey [name] - 55 y.o. male MRN [id number]  Date of birth: Nov 13, 1965  Office Visit Note: Visit Date: 11/13/2020 PCP: Holley Bouche, MD Referred by: Holley Bouche, MD  Subjective: Chief Complaint  Patient presents with   Lower Back - Pain   Left Leg - Pain   Right Leg - Pain   HPI:  Jeffrey [name] is a 54 y.o. male who comes in today for planned repeat radiofrequency ablation of the Bilateral L5-S1  Lumbar facet joints. This would be ablation of the corresponding medial branches and/or dorsal rami.  Patient has had double diagnostic blocks with more than 70% relief.  Subsequent ablation gave them more than 6 months of over 60% relief.  They have had chronic back pain for quite some time, more than 3 months, which has been an ongoing situation with recalcitrant axial back pain.  They have no radicular pain.  Their axial pain is worse with standing and ambulating and on exam today with facet loading.  They have had physical therapy as well as home exercise program.  The imaging noted in the chart below indicated facet pathology. Accordingly they meet all the criteria and qualification for for radiofrequency ablation and we are going to complete this today hopefully for more longer term relief as part of comprehensive management program.   ROS Otherwise per HPI.  Assessment & Plan: Visit Diagnoses:    ICD-10-CM   1. Spondylosis without myelopathy or radiculopathy, lumbar region  M47.816 XR C-ARM NO REPORT    Radiofrequency,Lumbar    betamethasone acetate-betamethasone sodium phosphate (CELESTONE) injection 12 mg      Plan: No additional findings.   Meds & Orders:  Meds ordered this encounter  Medications   betamethasone acetate-betamethasone sodium phosphate (CELESTONE) injection 12 mg    Orders Placed This Encounter  Procedures   Radiofrequency,Lumbar   XR C-ARM NO REPORT    Follow-up: Return if symptoms worsen or fail to improve.   Procedures: No procedures  performed  Lumbar Facet Joint Nerve Denervation  Patient: Jeffrey [name]      Date of Birth: [birth year] MRN: [id number] PCP: Holley Bouche, MD      Visit Date: 11/13/2020   Universal Protocol:    Date/Time: 09/06/225:23 AM  Consent Given By: the patient  Position: PRONE  Additional Comments: Vital signs were monitored before and after the procedure. Patient was prepped and draped in the usual sterile fashion. The correct patient, procedure, and site was verified.   Injection Procedure Details:   Procedure diagnoses:  1. Spondylosis without myelopathy or radiculopathy, lumbar region      Meds Administered:  Meds ordered this encounter  Medications   betamethasone acetate-betamethasone sodium phosphate (CELESTONE) injection 12 mg     Laterality: Bilateral  Location/Site:  L5-S1, L4 medial branch and L5 dorsal ramus  Needle: 18 ga.,  26m active tip RF Cannula  Needle Placement: Along juncture of superior articular process and transverse pocess  Findings:  -Comments:  Procedure Details: For each desired target nerve, the corresponding transverse process (sacral ala for the L5 dorsal rami) was identified and the fluoroscope was positioned to square off the endplates of the corresponding vertebral body to achieve a true AP midline view.  The beam was then obliqued 15 to 20 degrees and caudally tilted 15 to 20 degrees to line up a trajectory along the target nerves. The skin over the target of the junction of superior articulating process and transverse process (sacral ala for the L5 dorsal  rami) was infiltrated with 93m of 1% Lidocaine without Epinephrine.  The 18 gauge 122mactive tip outer cannula was advanced in trajectory view to the target.  This procedure was repeated for each target nerve.  Then, for all levels, the outer cannula placement was fine-tuned and the position was then confirmed with bi-planar imaging.    Test stimulation was done both at sensory and  motor levels to ensure there was no radicular stimulation. The target tissues were then infiltrated with 1 ml of 1% Lidocaine without Epinephrine. Subsequently, a percutaneous neurotomy was carried out for 90 seconds at 80 degrees Celsius.  After the completion of the lesion, 1 ml of injectate was delivered. It was then repeated for each facet joint nerve mentioned above. Appropriate radiographs were obtained to verify the probe placement during the neurotomy.   Additional Comments:  The patient tolerated the procedure well Dressing: 2 x 2 sterile gauze and Band-Aid    Post-procedure details: Patient was observed during the procedure. Post-procedure instructions were reviewed.  Patient left the clinic in stable condition.       Clinical History: MRI LUMBAR SPINE WITHOUT CONTRAST   TECHNIQUE: Multiplanar, multisequence MR imaging of the lumbar spine was performed. No intravenous contrast was administered.   COMPARISON:  None.   FINDINGS: Segmentation:  Standard.   Alignment:  Physiologic.   Vertebrae:  No fracture, evidence of discitis, or bone lesion.   Other: Ankylosis of the right SI joint. Mild osteoarthritis of the left SI joint.   Conus medullaris and cauda equina: Conus extends to the above the T12 level. Conus and cauda equina appear normal.   Paraspinal and other soft tissues: No acute paraspinal abnormality.   Disc levels:   Disc spaces: Degenerative disease mild disc height loss at L1-2. Disc desiccation at L2-3 and L3-4.   T12-L1: No significant disc bulge. No evidence of neural foraminal stenosis. No central canal stenosis.   L1-L2: Left foraminal/lateral small disc osteophyte complex. No evidence of neural foraminal stenosis. No central canal stenosis.   L2-L3: Large left far lateral disc osteophyte complex. Mild broad-based disc bulge. No evidence of neural foraminal stenosis. No central canal stenosis.   L3-L4: Mild broad-based disc bulge. Mild  bilateral facet arthropathy. Prominence of the epidural fat deforming the thecal sac as can be seen with epidural lipomatosis. Mild left foraminal narrowing. No right foraminal narrowing. No central canal stenosis.   L4-L5: Mild broad-based disc bulge. Mild bilateral facet arthropathy. Prominence of the epidural fat deforming the thecal sac as can be seen with epidural lipomatosis. No central canal stenosis. No foraminal stenosis.   L5-S1: No significant disc bulge. No foraminal stenosis. Moderate bilateral facet arthropathy. Prominence of the epidural fat deforming the thecal sac as can be seen with epidural lipomatosis. No central canal stenosis.   IMPRESSION: 1. At L3-4 there is a mild broad-based disc bulge. Mild bilateral facet arthropathy. Prominence of the epidural fat deforming the thecal sac as can be seen with epidural lipomatosis. Mild left foraminal narrowing. 2. At L4-5 there is a mild broad-based disc bulge. Mild bilateral facet arthropathy. Prominence of the epidural fat deforming the thecal sac as can be seen with epidural lipomatosis. 3. At L1-2 there is a left foraminal/lateral small disc osteophyte complex. 4. At L5-S1 there is moderate bilateral facet arthropathy. Prominence of the epidural fat deforming the thecal sac as can be seen with epidural lipomatosis.     Electronically Signed   By: HeKathreen Devoid On: 07/19/2019  07:42     Objective:  VS:  HT:    WT:   BMI:     BP:(!) 154/99  HR:84bpm  TEMP: ( )  RESP:  Physical Exam Vitals and nursing note reviewed.  Constitutional:      General: He is not in acute distress.    Appearance: Normal appearance. He is not ill-appearing.  HENT:     Head: Normocephalic and atraumatic.     Right Ear: External ear normal.     Left Ear: External ear normal.     Nose: No congestion.  Eyes:     Extraocular Movements: Extraocular movements intact.  Cardiovascular:     Rate and Rhythm: Normal rate.     Pulses:  Normal pulses.  Pulmonary:     Effort: Pulmonary effort is normal. No respiratory distress.  Abdominal:     General: There is no distension.     Palpations: Abdomen is soft.  Musculoskeletal:        General: No tenderness or signs of injury.     Cervical back: Neck supple.     Right lower leg: No edema.     Left lower leg: No edema.     Comments: Patient has good distal strength without clonus. Patient somewhat slow to rise from a seated position to full extension.  There is concordant low back pain with facet loading and lumbar spine extension rotation.  There are no definitive trigger points but the patient is somewhat tender across the lower back and PSIS.  There is no pain with hip rotation.   Skin:    Findings: No erythema or rash.  Neurological:     General: No focal deficit present.     Mental Status: He is alert and oriented to person, place, and time.     Sensory: No sensory deficit.     Motor: No weakness or abnormal muscle tone.     Coordination: Coordination normal.  Psychiatric:        Mood and Affect: Mood normal.        Behavior: Behavior normal.     Imaging: No results found.

## 2020-12-03 NOTE — Procedures (Signed)
Lumbar Facet Joint Nerve Denervation  Patient: Jeffrey [name]      Date of Birth: 02/24/1966 MRN: [id number] PCP: Holley Bouche, MD      Visit Date: 11/13/2020   Universal Protocol:    Date/Time: 09/06/225:23 AM  Consent Given By: the patient  Position: PRONE  Additional Comments: Vital signs were monitored before and after the procedure. Patient was prepped and draped in the usual sterile fashion. The correct patient, procedure, and site was verified.   Injection Procedure Details:   Procedure diagnoses:  1. Spondylosis without myelopathy or radiculopathy, lumbar region      Meds Administered:  Meds ordered this encounter  Medications   betamethasone acetate-betamethasone sodium phosphate (CELESTONE) injection 12 mg     Laterality: Bilateral  Location/Site:  L5-S1, L4 medial branch and L5 dorsal ramus  Needle: 18 ga.,  69m active tip RF Cannula  Needle Placement: Along juncture of superior articular process and transverse pocess  Findings:  -Comments:  Procedure Details: For each desired target nerve, the corresponding transverse process (sacral ala for the L5 dorsal rami) was identified and the fluoroscope was positioned to square off the endplates of the corresponding vertebral body to achieve a true AP midline view.  The beam was then obliqued 15 to 20 degrees and caudally tilted 15 to 20 degrees to line up a trajectory along the target nerves. The skin over the target of the junction of superior articulating process and transverse process (sacral ala for the L5 dorsal rami) was infiltrated with 17mof 1% Lidocaine without Epinephrine.  The 18 gauge 1037mctive tip outer cannula was advanced in trajectory view to the target.  This procedure was repeated for each target nerve.  Then, for all levels, the outer cannula placement was fine-tuned and the position was then confirmed with bi-planar imaging.    Test stimulation was done both at sensory and motor levels  to ensure there was no radicular stimulation. The target tissues were then infiltrated with 1 ml of 1% Lidocaine without Epinephrine. Subsequently, a percutaneous neurotomy was carried out for 90 seconds at 80 degrees Celsius.  After the completion of the lesion, 1 ml of injectate was delivered. It was then repeated for each facet joint nerve mentioned above. Appropriate radiographs were obtained to verify the probe placement during the neurotomy.   Additional Comments:  The patient tolerated the procedure well Dressing: 2 x 2 sterile gauze and Band-Aid    Post-procedure details: Patient was observed during the procedure. Post-procedure instructions were reviewed.  Patient left the clinic in stable condition.

## 2021-01-02 ENCOUNTER — Encounter: Payer: Self-pay | Admitting: Student

## 2021-01-02 ENCOUNTER — Other Ambulatory Visit: Payer: Self-pay

## 2021-01-02 ENCOUNTER — Ambulatory Visit (INDEPENDENT_AMBULATORY_CARE_PROVIDER_SITE_OTHER): Payer: 59 | Admitting: Student

## 2021-01-02 ENCOUNTER — Other Ambulatory Visit (HOSPITAL_COMMUNITY): Payer: Self-pay

## 2021-01-02 VITALS — BP 127/95 | HR 82 | Ht 75.0 in | Wt 266.0 lb

## 2021-01-02 DIAGNOSIS — I10 Essential (primary) hypertension: Secondary | ICD-10-CM | POA: Diagnosis not present

## 2021-01-02 DIAGNOSIS — Z0000 Encounter for general adult medical examination without abnormal findings: Secondary | ICD-10-CM

## 2021-01-02 DIAGNOSIS — Z23 Encounter for immunization: Secondary | ICD-10-CM | POA: Diagnosis not present

## 2021-01-02 DIAGNOSIS — Z720 Tobacco use: Secondary | ICD-10-CM | POA: Diagnosis not present

## 2021-01-02 DIAGNOSIS — E785 Hyperlipidemia, unspecified: Secondary | ICD-10-CM

## 2021-01-02 DIAGNOSIS — M545 Low back pain, unspecified: Secondary | ICD-10-CM | POA: Diagnosis not present

## 2021-01-02 DIAGNOSIS — Z1211 Encounter for screening for malignant neoplasm of colon: Secondary | ICD-10-CM | POA: Diagnosis not present

## 2021-01-02 LAB — POCT GLYCOSYLATED HEMOGLOBIN (HGB A1C): Hemoglobin A1C: 5.6 % (ref 4.0–5.6)

## 2021-01-02 MED ORDER — LISINOPRIL 20 MG PO TABS
20.0000 mg | ORAL_TABLET | Freq: Every day | ORAL | 3 refills | Status: DC
  Filled 2021-01-02: qty 90, 90d supply, fill #0

## 2021-01-02 MED ORDER — GABAPENTIN 300 MG PO CAPS
600.0000 mg | ORAL_CAPSULE | Freq: Every day | ORAL | 1 refills | Status: DC
  Filled 2021-01-02: qty 60, 30d supply, fill #0
  Filled 2021-03-04: qty 60, 30d supply, fill #1

## 2021-01-02 MED ORDER — NICOTINE POLACRILEX 4 MG MT GUM
4.0000 mg | CHEWING_GUM | OROMUCOSAL | 0 refills | Status: DC | PRN
  Filled 2021-01-02: qty 100, 7d supply, fill #0

## 2021-01-02 NOTE — Assessment & Plan Note (Signed)
Patient with history of HLD, taking lipitor 40 mg -Lipid panel today

## 2021-01-02 NOTE — Assessment & Plan Note (Signed)
Patient cut smoking back to 1/2 ppd, from 1 ppd, for over 15 years. -Start Nicotine gum -Start Nicotine lozenges

## 2021-01-02 NOTE — Patient Instructions (Addendum)
It was great to see you! Thank you for allowing me to participate in your care!  I recommend that you always bring your medications to each appointment as this makes it easy to ensure we are on the correct medications and helps [place] not miss when refills are needed.  Our plans for today:  - Colonoscopy - Increasing dose of lisinopril to 20 mg daily - BMP & Lipid panel - Start Nicotine gum - Gabapentin 600 at night, for back pain -Tylenol may take a max of 1000 mg every 8 hours, for a max daily dose of 3,000 mg, equal to 3 grams.  We are checking some labs today, I will call you if they are abnormal will send you a MyChart message or a letter if they are normal.  If you do not hear about your labs in the next 2 weeks please let us know.  Take care and seek immediate care sooner if you develop any concerns.   Dr. Holley Bouche, MD [place]

## 2021-01-02 NOTE — Assessment & Plan Note (Signed)
Patient still struggling with chronic back pain, limting his sleep and abillity to move -Gabapentin 600 mg qhs -Tylenol 1000 mg every 8 hours, max dose 3,000 mg

## 2021-01-02 NOTE — Progress Notes (Signed)
    SUBJECTIVE:   CHIEF COMPLAINT / HPI:   Last annual visit was 10/26/19  Back Pain Still struggling with back pain causing him to have problems sleeping and getting around, also affecting the mood. Has tried gabapentin and flexeril and both only helped for a short period of time. Says percocet was the one thing that helped, for his knees in 2018. Has tried heat and ice.   HTN -Currently taking amlodipine 10 and lisinopril 10. BP is 127/95. Currently smoking 1/2 ppd, with no complaints of chest pain, SOB, vision changes, headaches.  HLD -Currently taking lipitor 40, no muscle aches or weakness  Tobacco abuse Smoking less than 1/2 a pack a day, was smoking a pack a day, for 15 + years. Usually has cough at night.  Due for Colonoscopy   Flu shot  Review of Systems  Constitutional:  Negative for chills and fever.  Respiratory:  Positive for cough. Negative for sputum production.   Cardiovascular:  Positive for leg swelling. Negative for chest pain and claudication.  Gastrointestinal:  Negative for abdominal pain, constipation, diarrhea, nausea and vomiting.  Neurological:  Negative for headaches.    PERTINENT  PMH / PSH: HTN, PE, HLD  OBJECTIVE:   BP (!) 127/95   Pulse 82   Ht 6\' 3"  (1.905 m)   Wt 266 lb (120.7 kg)   SpO2 99%   BMI 33.25 kg/m   Physical Exam Constitutional:      Appearance: Normal appearance. He is obese.  HENT:     Head: Normocephalic.  Cardiovascular:     Rate and Rhythm: Normal rate and regular rhythm.     Pulses: Normal pulses.     Heart sounds: Normal heart sounds. No murmur heard.   No friction rub. No gallop.  Pulmonary:     Effort: Pulmonary effort is normal. No respiratory distress.     Breath sounds: Normal breath sounds. No stridor. No wheezing.  Abdominal:     General: Abdomen is flat. Bowel sounds are normal. There is no distension.     Palpations: Abdomen is soft.     Tenderness: There is no abdominal tenderness.  Musculoskeletal:      Right lower leg: Swelling present. No deformity.     Left lower leg: Normal. No swelling or deformity.  Neurological:     Mental Status: He is alert.     ASSESSMENT/PLAN:   HTN (hypertension) Current blood pressure is 127/95. Patient continues to smoke 1/2 ppd. Has no complaints of SOB, chest pain, headaches -Increase lisinopril to 20 mg daily -Check BMP today -Follow up in 1-2 months for BP check  Hyperlipidemia LDL goal <70 Patient with history of HLD, taking lipitor 40 mg -Lipid panel today  Tobacco abuse Patient cut smoking back to 1/2 ppd, from 1 ppd, for over 15 years. -Start Nicotine gum -Start Nicotine lozenges   Lumbar back pain Patient still struggling with chronic back pain, limting his sleep and abillity to move -Gabapentin 600 mg qhs -Tylenol 1000 mg every 8 hours, max dose 3,000 mg  Scheduled Colonoscopy   Flu shot given   Holley Bouche, MD [place]

## 2021-01-02 NOTE — Assessment & Plan Note (Signed)
Current blood pressure is 127/95. Patient continues to smoke 1/2 ppd. Has no complaints of SOB, chest pain, headaches -Increase lisinopril to 20 mg daily -Check BMP today -Follow up in 1-2 months for BP check

## 2021-01-03 ENCOUNTER — Encounter: Payer: Self-pay | Admitting: Student

## 2021-01-03 LAB — LIPID PANEL
Chol/HDL Ratio: 2.5 ratio (ref 0.0–5.0)
Cholesterol, Total: 111 mg/dL (ref 100–199)
HDL: 45 mg/dL (ref >39)
LDL Chol Calc (NIH): 54 mg/dL (ref 0–99)
Triglycerides: 52 mg/dL (ref 0–149)
VLDL Cholesterol Cal: 12 mg/dL (ref 5–40)

## 2021-01-03 LAB — BASIC METABOLIC PANEL
BUN/Creatinine Ratio: 10 (ref 9–20)
BUN: 9 mg/dL (ref 6–24)
CO2: 22 mmol/L (ref 20–29)
Calcium: 9.5 mg/dL (ref 8.7–10.2)
Chloride: 101 mmol/L (ref 96–106)
Creatinine, Ser: 0.9 mg/dL (ref 0.76–1.27)
Glucose: 87 mg/dL (ref 70–99)
Potassium: 4.9 mmol/L (ref 3.5–5.2)
Sodium: 139 mmol/L (ref 134–144)
eGFR: 100 mL/min/{1.73_m2} (ref >59)

## 2021-01-03 NOTE — Addendum Note (Signed)
Addended by: Holley Bouche T on: 01/03/2021 01:46 PM   Modules accepted: Orders

## 2021-03-04 ENCOUNTER — Other Ambulatory Visit: Payer: Self-pay | Admitting: Student

## 2021-03-04 ENCOUNTER — Other Ambulatory Visit (HOSPITAL_COMMUNITY): Payer: Self-pay

## 2021-03-05 ENCOUNTER — Other Ambulatory Visit (HOSPITAL_COMMUNITY): Payer: Self-pay

## 2021-03-05 MED ORDER — APIXABAN 5 MG PO TABS
5.0000 mg | ORAL_TABLET | Freq: Two times a day (BID) | ORAL | 0 refills | Status: DC
  Filled 2021-03-05: qty 60, 30d supply, fill #0

## 2021-03-12 ENCOUNTER — Telehealth: Payer: Self-pay | Admitting: Physical Medicine and Rehabilitation

## 2021-03-12 NOTE — Telephone Encounter (Signed)
Patient called needing to schedule an appointment with Dr Ernestina Patches for his back. The number to contact patient is [phone]

## 2021-03-19 ENCOUNTER — Ambulatory Visit: Payer: 59 | Admitting: Physical Medicine and Rehabilitation

## 2021-06-26 ENCOUNTER — Encounter: Payer: Self-pay | Admitting: Student

## 2021-07-11 IMAGING — MR MR LUMBAR SPINE W/O CM
5 of 6 series · 25 of 48 positions shown · non-contrast
Comparison: None.

CLINICAL DATA: Low back pain, pain for over 6 weeks.

EXAM:
MRI LUMBAR SPINE WITHOUT CONTRAST
TECHNIQUE: Multiplanar, multisequence MR imaging of the lumbar spine was
performed. No intravenous contrast was administered.

[Series 5: T1 · sagittal · 4.0mm · 0.81mm/px · 3 of 14 slices shown (1 of 2)]
[im 1/14]
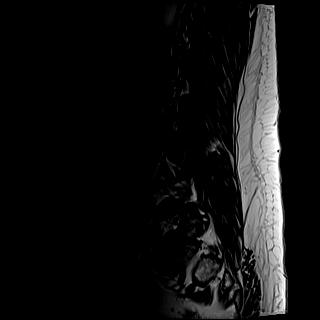
[im 7/14]
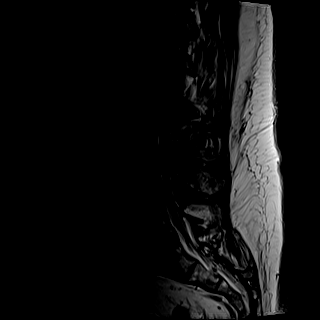
[im 14/14]
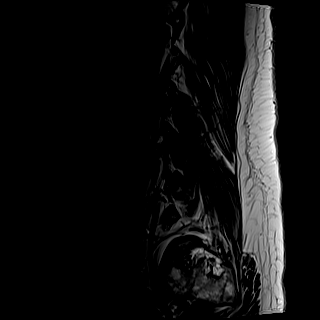

[Series 6: T2 · sagittal · 4.0mm · 0.81mm/px · 3 of 14 slices shown (1 of 2)]
[im 1/14]
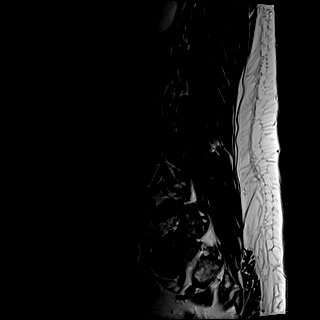
[im 7/14]
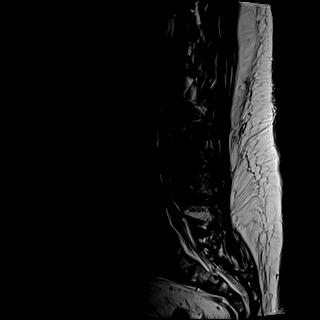
[im 14/14]
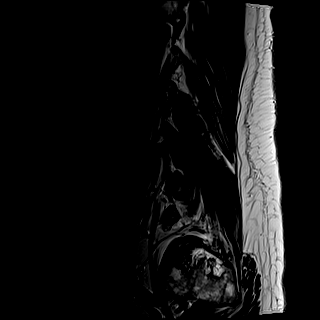

[Series 7: STIR · sagittal · 4.0mm · 0.51mm/px · 3 of 14 slices shown]
[im 1/14]
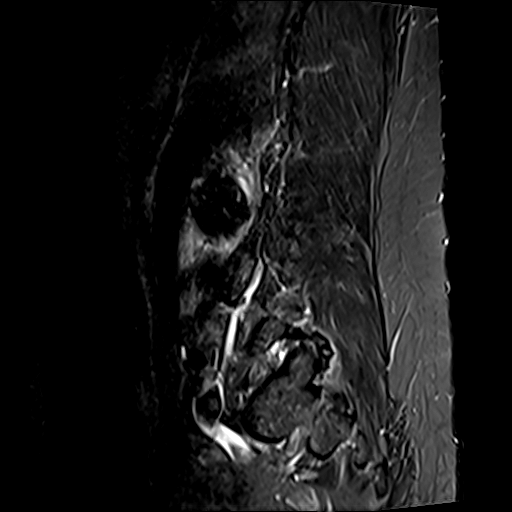
[im 7/14]
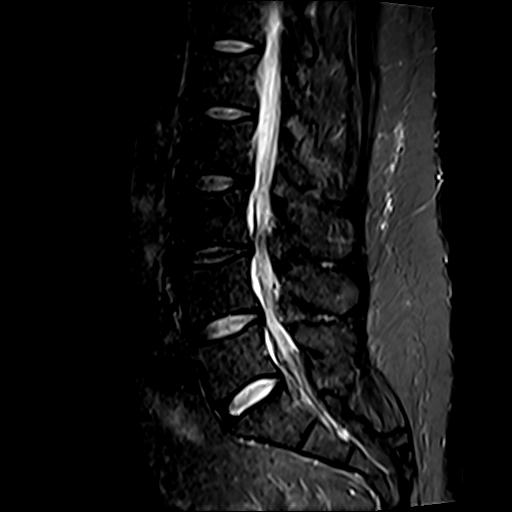
[im 14/14]
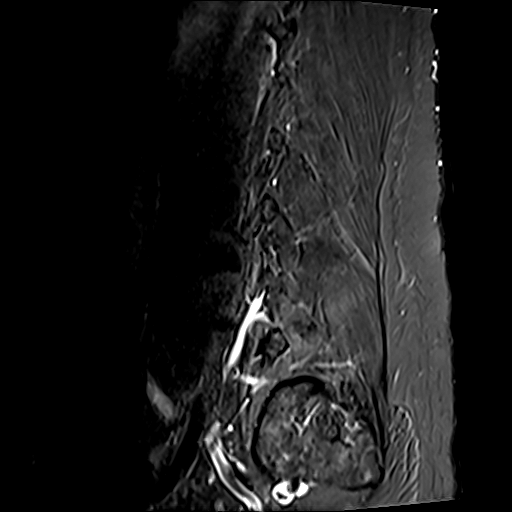

[Series 8: T2 · axial · 4.0mm · 0.62mm/px · z∈[-33,+216]mm · 8 of 43 slices shown (2 of 2)]
[im 1/43]
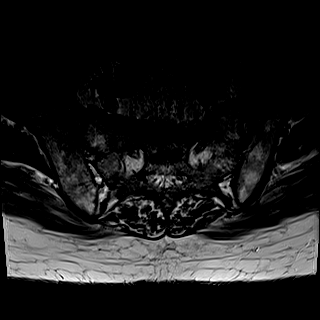
[im 7/43]
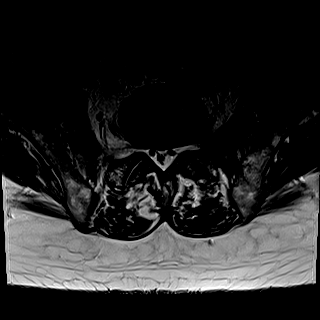
[im 13/43]
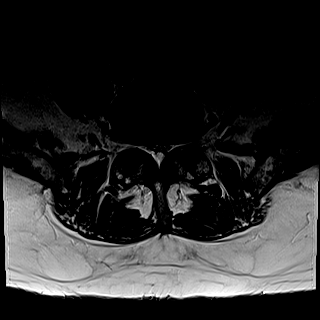
[im 19/43]
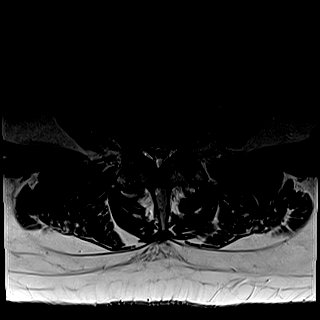
[im 25/43]
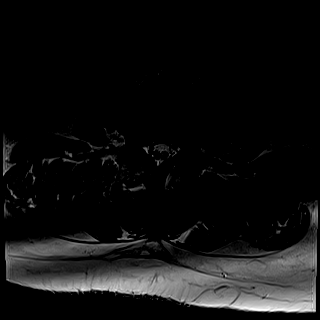
[im 31/43]
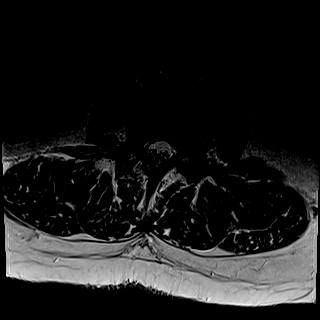
[im 37/43]
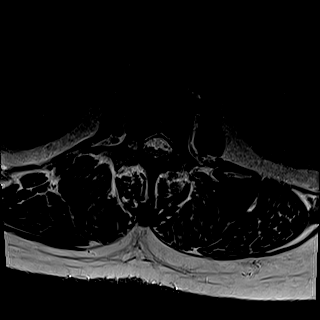
[im 43/43]
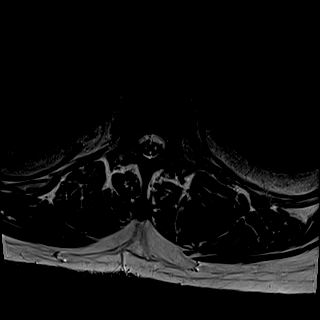

[Series 9: T1 · axial · 4.0mm · 0.43mm/px · z∈[-35,+218]mm · 8 of 43 slices shown (2 of 2)]
[im 1/43]
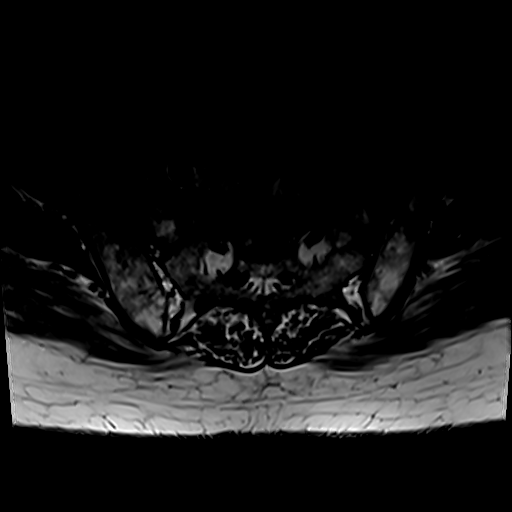
[im 7/43]
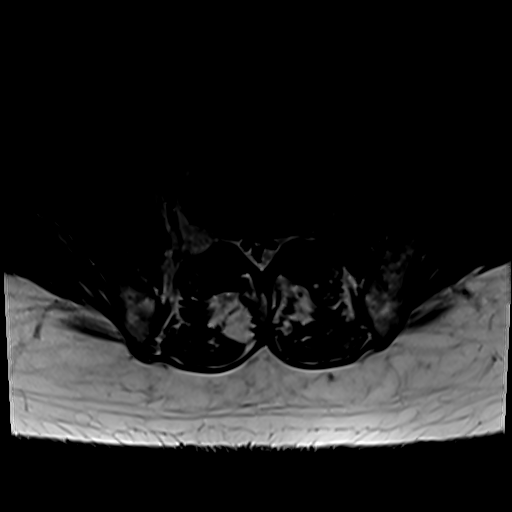
[im 13/43]
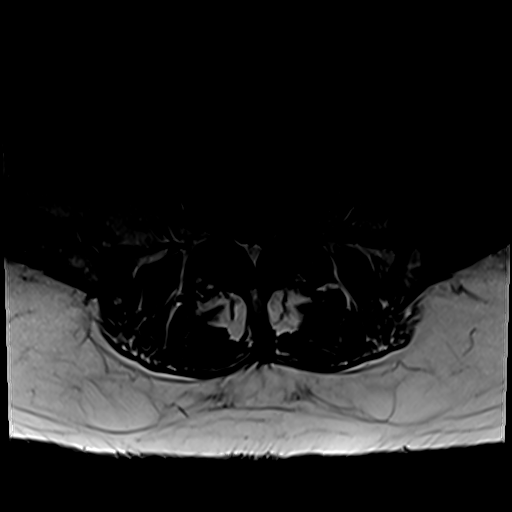
[im 19/43]
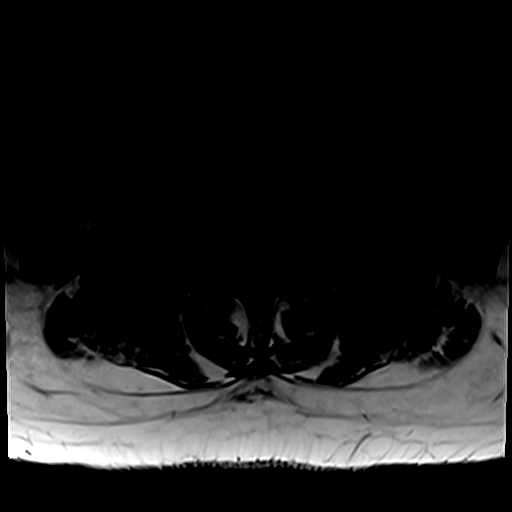
[im 25/43]
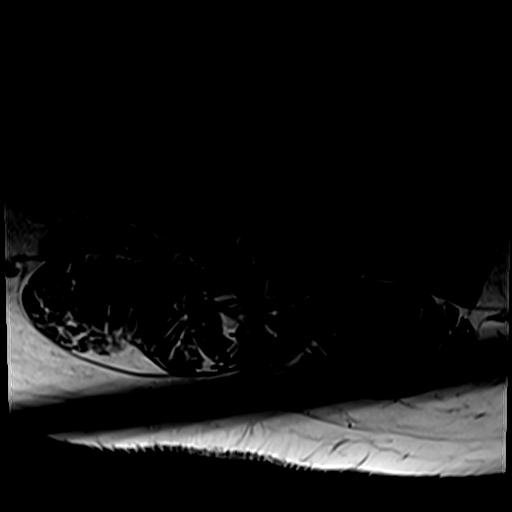
[im 31/43]
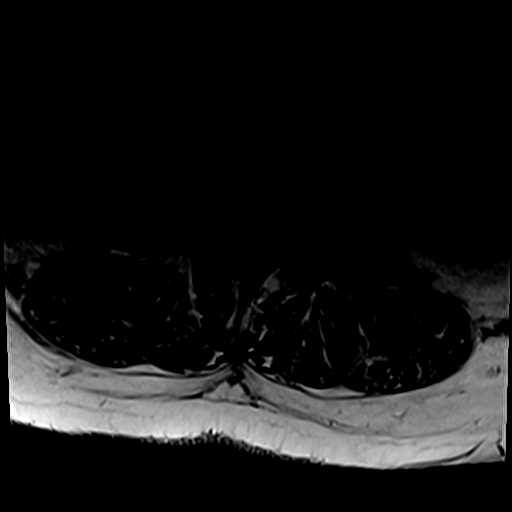
[im 37/43]
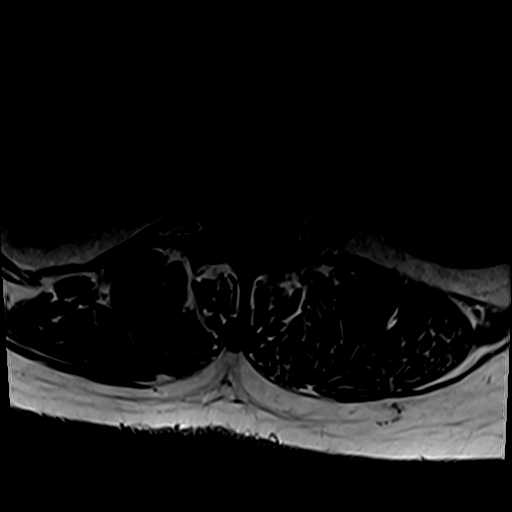
[im 43/43]
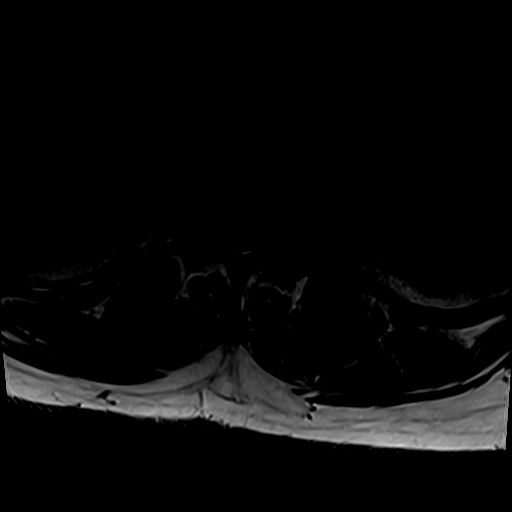

[25 of 48 positions shown; findings below may reference images not displayed]

FINDINGS: Segmentation:  Standard.

Alignment:  Physiologic.

Vertebrae:  No fracture, evidence of discitis, or bone lesion.

Other: Ankylosis of the right SI joint. Mild osteoarthritis of the
left SI joint.

Conus medullaris and cauda equina: Conus extends to the above the
T12 level. Conus and cauda equina appear normal.

Paraspinal and other soft tissues: No acute paraspinal abnormality.

Disc levels:

Disc spaces: Degenerative disease mild disc height loss at L1-2.
Disc desiccation at L2-3 and L3-4.

T12-L1: No significant disc bulge. No evidence of neural foraminal
stenosis. No central canal stenosis.

L1-L2: Left foraminal/lateral small disc osteophyte complex. No
evidence of neural foraminal stenosis. No central canal stenosis.

L2-L3: Large left far lateral disc osteophyte complex. Mild
broad-based disc bulge. No evidence of neural foraminal stenosis. No
central canal stenosis.

L3-L4: Mild broad-based disc bulge. Mild bilateral facet
arthropathy. Prominence of the epidural fat deforming the thecal sac
as can be seen with epidural lipomatosis. Mild left foraminal
narrowing. No right foraminal narrowing. No central canal stenosis.

L4-L5: Mild broad-based disc bulge. Mild bilateral facet
arthropathy. Prominence of the epidural fat deforming the thecal sac
as can be seen with epidural lipomatosis. No central canal stenosis.
No foraminal stenosis.

L5-S1: No significant disc bulge. No foraminal stenosis. Moderate
bilateral facet arthropathy. Prominence of the epidural fat
deforming the thecal sac as can be seen with epidural lipomatosis.
No central canal stenosis.
IMPRESSION: 1. At L3-4 there is a mild broad-based disc bulge. Mild bilateral
facet arthropathy. Prominence of the epidural fat deforming the
thecal sac as can be seen with epidural lipomatosis. Mild left
foraminal narrowing.
2. At L4-5 there is a mild broad-based disc bulge. Mild bilateral
facet arthropathy. Prominence of the epidural fat deforming the
thecal sac as can be seen with epidural lipomatosis.
3. At L1-2 there is a left foraminal/lateral small disc osteophyte
complex.
4. At L5-S1 there is moderate bilateral facet arthropathy.
Prominence of the epidural fat deforming the thecal sac as can be
seen with epidural lipomatosis.

## 2021-07-25 ENCOUNTER — Other Ambulatory Visit (HOSPITAL_COMMUNITY): Payer: Self-pay

## 2021-07-25 ENCOUNTER — Other Ambulatory Visit: Payer: Self-pay | Admitting: Student

## 2021-07-25 DIAGNOSIS — I10 Essential (primary) hypertension: Secondary | ICD-10-CM

## 2021-07-28 ENCOUNTER — Other Ambulatory Visit: Payer: Self-pay

## 2021-07-28 ENCOUNTER — Ambulatory Visit: Payer: 59 | Admitting: Family Medicine

## 2021-07-28 ENCOUNTER — Encounter: Payer: Self-pay | Admitting: Family Medicine

## 2021-07-28 ENCOUNTER — Other Ambulatory Visit (HOSPITAL_COMMUNITY): Payer: Self-pay

## 2021-07-28 VITALS — BP 139/100 | HR 93 | Wt 256.0 lb

## 2021-07-28 DIAGNOSIS — G4730 Sleep apnea, unspecified: Secondary | ICD-10-CM

## 2021-07-28 DIAGNOSIS — I10 Essential (primary) hypertension: Secondary | ICD-10-CM | POA: Diagnosis not present

## 2021-07-28 DIAGNOSIS — G479 Sleep disorder, unspecified: Secondary | ICD-10-CM | POA: Insufficient documentation

## 2021-07-28 MED ORDER — APIXABAN 5 MG PO TABS
5.0000 mg | ORAL_TABLET | Freq: Two times a day (BID) | ORAL | 0 refills | Status: DC
  Filled 2021-07-28: qty 60, 30d supply, fill #0

## 2021-07-28 MED ORDER — AMLODIPINE BESYLATE 10 MG PO TABS
10.0000 mg | ORAL_TABLET | Freq: Every day | ORAL | 1 refills | Status: DC
  Filled 2021-07-28: qty 30, 30d supply, fill #0
  Filled 2021-09-17: qty 30, 30d supply, fill #1

## 2021-07-28 MED ORDER — LISINOPRIL 20 MG PO TABS
20.0000 mg | ORAL_TABLET | Freq: Every day | ORAL | 3 refills | Status: DC
  Filled 2021-07-28: qty 90, 90d supply, fill #0

## 2021-07-28 NOTE — Assessment & Plan Note (Signed)
BP 160/100 and 139/100 on repeat likely in the setting of not taking his medications for 4 days because he ran out.  Asymptomatic. Refilled Amlodipine '10mg'$  and Lisinopril '20mg'$  daily ?

## 2021-07-28 NOTE — Patient Instructions (Addendum)
It was great seeing you today! ? ?You came to get refills of his medication and I have sent in your amlodipine, lisinopril, eliquis  ? ?I have also placed a referral for your sleep study.  You should hear back in the next 1 to 2 weeks to schedule an appointment but if you do not hear back please give [place] a call the clinic and we can check on that. ? ?Please check-out at the front desk before leaving the clinic. Schedule to see your PCP in the next couple of weeks for your physical, but if you need to be seen earlier than that for any new issues we're happy to fit you in, just give [place] a call! ? ?Visit Reminders: ?- Stop by the pharmacy to pick up your prescriptions  ?- Continue to work on your healthy eating habits and incorporating exercise into your daily life.  ? ?Feel free to call with any questions or concerns at any time, at [phone]. ?  ?Take care,  ?Dr. Shary Key ?[place]  ?

## 2021-07-28 NOTE — Progress Notes (Signed)
? ? ?  SUBJECTIVE:  ? ?CHIEF COMPLAINT / HPI:  ? ?Patient presents for medication refills. He states he was out of town and was told he needed to make an appointment before he could refill them. His blood pressure is elevated today at 160/100 and on repeat 139/100. Last took his medications 4 days ago. Denies headaches, vision changes, chest pain, dyspnea  ? ?He is inquiring about getting set up at a sleep clinic. He states he sleeps for only about 3 hours a night. Feels it is affecting his mood. Also feels back pain contributing, follows with Ortho had spinal fusion and gets injections ?Has been dealing with sleeping problems for years. Does snore. States he has a diagnosis of sleep apnea but states he hasnt used CPAP for years ? ? ?OBJECTIVE:  ? ?BP (!) 139/100   Pulse 93   Wt 256 lb (116.1 kg)   SpO2 100%   BMI 32.00 kg/m?   ? ?Physical exam ?General: well appearing, NAD ?Cardiovascular: RRR, no murmurs ?Lungs: CTAB. Normal WOB ?Abdomen: soft, non-distended, non-tender ?Skin: warm, dry. No edema ? ? ?ASSESSMENT/PLAN:  ? ?HTN (hypertension) ?BP 160/100 and 139/100 on repeat likely in the setting of not taking his medications for 4 days because he ran out.  Asymptomatic. Refilled Amlodipine '10mg'$  and Lisinopril '20mg'$  daily ? ?Sleep disturbance ?Patient presents with feeling tired, not being able to sleep well, snoring, mood changes. Placed referral for sleep study for further evaluation.  ?  ?Addressed PHQ 9 which was 14. Feels his mood changes are mainly due to lack of sleep, Denies SI. Denies counseling or medication at this time  ? ?Advised to return in the next couple of weeks for physical exam ? ? ?Shary Key, DO ?[facility]   ?

## 2021-07-28 NOTE — Assessment & Plan Note (Signed)
Patient presents with feeling tired, not being able to sleep well, snoring, mood changes. Placed referral for sleep study for further evaluation.  ?

## 2021-08-29 ENCOUNTER — Ambulatory Visit (INDEPENDENT_AMBULATORY_CARE_PROVIDER_SITE_OTHER): Payer: 59 | Admitting: Family Medicine

## 2021-08-29 ENCOUNTER — Other Ambulatory Visit (HOSPITAL_COMMUNITY): Payer: Self-pay

## 2021-08-29 ENCOUNTER — Encounter: Payer: Self-pay | Admitting: Family Medicine

## 2021-08-29 VITALS — BP 110/88 | HR 94 | Ht 75.0 in | Wt 254.1 lb

## 2021-08-29 DIAGNOSIS — Z122 Encounter for screening for malignant neoplasm of respiratory organs: Secondary | ICD-10-CM | POA: Diagnosis not present

## 2021-08-29 DIAGNOSIS — F39 Unspecified mood [affective] disorder: Secondary | ICD-10-CM

## 2021-08-29 DIAGNOSIS — Z0000 Encounter for general adult medical examination without abnormal findings: Secondary | ICD-10-CM | POA: Diagnosis not present

## 2021-08-29 DIAGNOSIS — Z1211 Encounter for screening for malignant neoplasm of colon: Secondary | ICD-10-CM | POA: Diagnosis not present

## 2021-08-29 DIAGNOSIS — Z1212 Encounter for screening for malignant neoplasm of rectum: Secondary | ICD-10-CM

## 2021-08-29 MED ORDER — SERTRALINE HCL 25 MG PO TABS
25.0000 mg | ORAL_TABLET | Freq: Every day | ORAL | 0 refills | Status: DC
  Filled 2021-08-29: qty 30, 30d supply, fill #0

## 2021-08-29 NOTE — Patient Instructions (Addendum)
It was great seeing you today!  Today you came in for your physical exam.   We are starting on a low-dose antidepressant called Zoloft 25 mg daily, and we will increase that when you come for follow-up in the next 2 weeks.  I have included more information about this below  For information on therapists, please go to [email or web address]. You can also contact your insurance company to find an Theatre manager.   Please check-out at the front desk before leaving the clinic. I'd like to see you back in 2 weeks but if you need to be seen earlier than that for any new issues we're happy to fit you in, just give [place] a call!  Visit Reminders: - Stop by the pharmacy to pick up your prescriptions  - Continue to work on your healthy eating habits and incorporating exercise into your daily life.   Feel free to call with any questions or concerns at any time, at [phone].   Take care,  Dr. Shary Key [facility]   Sertraline Tablets What is this medication? SERTRALINE (SER tra leen) treats depression, anxiety, obsessive-compulsive disorder (OCD), post-traumatic stress disorder (PTSD), and premenstrual dysphoric disorder (PMDD). It increases the amount of serotonin in the brain, a hormone that helps regulate mood. It belongs to a group of medications called SSRIs. This medicine may be used for other purposes; ask your health care provider or pharmacist if you have questions. COMMON BRAND NAME(S): Zoloft What should I tell my care team before I take this medication? They need to know if you have any of these conditions: Bleeding disorders Bipolar disorder or a family history of bipolar disorder Frequently drink alcohol Glaucoma Heart disease High blood pressure History of irregular heartbeat History of low levels of calcium, magnesium, or potassium in the blood Liver disease Receiving electroconvulsive therapy Seizures Suicidal thoughts, plans, or  attempt; a previous suicide attempt by you or a family member Take medications that prevent or treat blood clots Thyroid disease An unusual or allergic reaction to sertraline, other medications, foods, dyes, or preservatives Pregnant or trying to get pregnant Breast-feeding How should I use this medication? Take this medication by mouth with a glass of water. Follow the directions on the prescription label. You can take it with or without food. Take your medication at regular intervals. Do not take your medication more often than directed. Do not stop taking this medication suddenly except upon the advice of your care team. Stopping this medication too quickly may cause serious side effects or your condition may worsen. A special MedGuide will be given to you by the pharmacist with each prescription and refill. Be sure to read this information carefully each time. Talk to your care team about the use of this medication in children. While this medication may be prescribed for children as young as 7 years for selected conditions, precautions do apply. Overdosage: If you think you have taken too much of this medicine contact a poison control center or emergency room at once. NOTE: This medicine is only for you. Do not share this medicine with others. What if I miss a dose? If you miss a dose, take it as soon as you can. If it is almost time for your next dose, take only that dose. Do not take double or extra doses. What may interact with this medication? Do not take this medication with any of the following: Cisapride Dronedarone Linezolid MAOIs like Carbex, Eldepryl, Marplan, Nardil, and Parnate Methylene  blue (injected into a vein) Pimozide Thioridazine This medication may also interact with the following: Alcohol Amphetamines Aspirin and aspirin-like medications Certain medications for depression, anxiety, or other mental health conditions Certain medications for fungal infections like  ketoconazole, fluconazole, posaconazole, and itraconazole Certain medications for irregular heart beat like flecainide, quinidine, propafenone Certain medications for migraine headaches like almotriptan, eletriptan, frovatriptan, naratriptan, rizatriptan, sumatriptan, zolmitriptan Certain medications for sleep Certain medications for seizures like carbamazepine, valproic acid, phenytoin Certain medications that treat or prevent blood clots like warfarin, enoxaparin, dalteparin Cimetidine Digoxin Diuretics Fentanyl Isoniazid Lithium NSAIDs, medications for pain and inflammation, like ibuprofen or naproxen Other medications that prolong the QT interval (cause an abnormal heart rhythm) like dofetilide Rasagiline Safinamide Supplements like St. John's wort, kava kava, valerian Tolbutamide Tramadol Tryptophan This list may not describe all possible interactions. Give your health care provider a list of all the medicines, herbs, non-prescription drugs, or dietary supplements you use. Also tell them if you smoke, drink alcohol, or use illegal drugs. Some items may interact with your medicine. What should I watch for while using this medication? Tell your care team if your symptoms do not get better or if they get worse. Visit your care team for regular checks on your progress. Because it may take several weeks to see the full effects of this medication, it is important to continue your treatment as prescribed by your care team. Patients and their families should watch out for new or worsening thoughts of suicide or depression. Also watch out for sudden changes in feelings such as feeling anxious, agitated, panicky, irritable, hostile, aggressive, impulsive, severely restless, overly excited and hyperactive, or not being able to sleep. If this happens, especially at the beginning of treatment or after a change in dose, call your care team. This medication may affect your coordination, reaction time,  or judgment. Do not drive or operate machinery until you know how this medication affects you. Sit or stand up slowly to reduce the risk of dizzy or fainting spells. Drinking alcohol with this medication can increase the risk of these side effects. Your mouth may get dry. Chewing sugarless gum or sucking hard candy, and drinking plenty of water may help. Contact your care team if the problem does not go away or is severe. What side effects may I notice from receiving this medication? Side effects that you should report to your care team as soon as possible: Allergic reactions--skin rash, itching, hives, swelling of the face, lips, tongue, or throat Bleeding--bloody or black, tar-like stools, red or dark brown urine, vomiting blood or brown material that looks like coffee grounds, small red or purple spots on skin, unusual bleeding or bruising Heart rhythm changes--fast or irregular heartbeat, dizziness, feeling faint or lightheaded, chest pain, trouble breathing Low sodium level--muscle weakness, fatigue, dizziness, headache, confusion Serotonin syndrome--irritability, confusion, fast or irregular heartbeat, muscle stiffness, twitching muscles, sweating, high fever, seizure, chills, vomiting, diarrhea Sudden eye pain or change in vision such as blurred vision, seeing halos around lights, vision loss Thoughts of suicide or self-harm, worsening mood Side effects that usually do not require medical attention (report these to your care team if they continue or are bothersome): Change in sex drive or performance Diarrhea Excessive sweating Nausea Tremors or shaking Upset stomach This list may not describe all possible side effects. Call your doctor for medical advice about side effects. You may report side effects to FDA at 1-800-FDA-1088. Where should I keep my medication? Keep out of the reach of  children and pets. Store at room temperature between 15 and 30 degrees C (59 and 86 degrees F). Get rid  of any unused medication after the expiration date. To get rid of medications that are no longer needed or expired: Take the medication to a medication take-back program. Check with your pharmacy or law enforcement to find a location. If you cannot return the medication, check the label or package insert to see if the medication should be thrown out in the garbage or flushed down the toilet. If you are not sure, ask your care team. If it is safe to put in the trash, empty the medication out of the container. Mix the medication with cat litter, dirt, coffee grounds, or other unwanted substance. Seal the mixture in a bag or container. Put it in the trash. NOTE: This sheet is a summary. It may not cover all possible information. If you have questions about this medicine, talk to your doctor, pharmacist, or health care provider.  2023 Elsevier/Gold Standard (2020-09-16 00:00:00)

## 2021-08-29 NOTE — Progress Notes (Signed)
    SUBJECTIVE:   Chief compliant/HPI: annual examination  Jeffrey [name] is a 56 y.o. who presents today for an annual exam.   Sleep clinic has appointment on 6/28. HE states he only sleeps 2-3 hours and is up for the rest of the day.   Sees Ortho and chiropractor for his back pain with sciatica that radiates down both legs. Also has had both knees replaced. Takes Gabapentin which he does not feel is helping much. Ambulates with cane. Has also been losing his balance recently. Keeps back brace on.   Has smoked 1ppd for the past 30 years  Feels increased stress so not ready to quit right now. pen to therapy. Open to starting medication for his mood. Already has the nicotine gum sent from pharmacy   Feels angry and taking it out on other people Mood affects interest in things. Affects relationship with son. He feels dad is always mad at him. Things to do around aroudn the house and doent have the motivation to do it.   Feels marriage is good but mood affecting relationship with son.  No new partners. No concern for STIs.    OBJECTIVE:   BP 110/88   Pulse 94   Ht '6\' 3"'$  (1.905 m)   Wt 254 lb 2 oz (115.3 kg)   SpO2 96%   BMI 31.76 kg/m    Physical exam General: well appearing, NAD Cardiovascular: RRR, no murmurs Lungs: CTAB. Normal WOB Abdomen: soft, non-distended, non-tender Skin: warm, dry. No edema Psych: mood and affect appropriate. Thought content and judgement normal. Speech non pressured and non tangential   ASSESSMENT/PLAN:   At risk for mood disturbance Patient scored 19 on PHQ9. Negataive SI/HI. Patient feeling down, easily angry and irritable, sleeping less, with mood affecting how he treats his son and he is experiencing decreased interest in the things he used to do.  Was initially going to start patient on SSRI, but given his anger as well as concerns for mania when I talked with him further, had him complete the mood disorder questionnaire which he scored an 8  on, putting him at high risk for a mood disorder.  Given he is interested in therapy, I think the best course of action would be a referral to psychology in order to be placed on an appropriate medication as well as receive therapy.  Recommend follow-up with PCP to check on mood in the next month.   Annual Examination  See AVS for age appropriate recommendations.  PHQ score 16, reviewed and discussed. See additional details above  Blood pressure value is at goal, discussed.   Considered the following screening exams based upon USPSTF recommendations: Diabetes screening:  not ordered. Last 2 A1c's in past year normal. Asymptomatic.  Screening for elevated cholesterol:  not ordered. Last lipid panel 6 months ago normal HIV testing:  not ordered  Hepatitis C:  not ordered  Hepatitis B:  not ordered  Reviewed risk factors for latent tuberculosis and not indicated Colorectal cancer screening: discussed options, elected for cologuard Lung cancer screening: Patient now with 30 pack year history and qualifies for lung cancer screen. Will place order PSA not discussed. Recommend discussing at follow up.   Follow up with PCP regarding mood in about a month to ensure outpatient psych/ therapy started.    [place]

## 2021-09-01 ENCOUNTER — Other Ambulatory Visit (HOSPITAL_COMMUNITY): Payer: Self-pay

## 2021-09-01 DIAGNOSIS — Z9189 Other specified personal risk factors, not elsewhere classified: Secondary | ICD-10-CM | POA: Insufficient documentation

## 2021-09-01 NOTE — Addendum Note (Signed)
Addended by: Leonia Corona R on: 09/01/2021 04:50 PM   Modules accepted: Orders

## 2021-09-01 NOTE — Assessment & Plan Note (Signed)
Patient scored 19 on PHQ9. Negataive SI/HI. Patient feeling down, easily angry and irritable, sleeping less, with mood affecting how he treats his son and he is experiencing decreased interest in the things he used to do.  Was initially going to start patient on SSRI, but given his anger as well as concerns for mania when I talked with him further, had him complete the mood disorder questionnaire which he scored an 8 on, putting him at high risk for a mood disorder.  Given he is interested in therapy, I think the best course of action would be a referral to psychology in order to be placed on an appropriate medication as well as receive therapy.  Recommend follow-up with PCP to check on mood in the next month.

## 2021-09-02 ENCOUNTER — Encounter: Payer: Self-pay | Admitting: *Deleted

## 2021-09-08 ENCOUNTER — Telehealth: Payer: Self-pay

## 2021-09-08 NOTE — Telephone Encounter (Signed)
Called patient for upcoming appointment.  CT Chest Lung Cancer scan [facility] [place] [address] 1300 hrs with an arrival of 1245  Patient verbalized understanding and also states that he has [facility].  Ozella Almond, [place]

## 2021-09-09 ENCOUNTER — Ambulatory Visit (INDEPENDENT_AMBULATORY_CARE_PROVIDER_SITE_OTHER): Payer: 59

## 2021-09-09 ENCOUNTER — Other Ambulatory Visit (HOSPITAL_COMMUNITY): Payer: Self-pay

## 2021-09-09 ENCOUNTER — Ambulatory Visit (INDEPENDENT_AMBULATORY_CARE_PROVIDER_SITE_OTHER): Payer: 59 | Admitting: Physician Assistant

## 2021-09-09 ENCOUNTER — Other Ambulatory Visit: Payer: Self-pay

## 2021-09-09 DIAGNOSIS — M1611 Unilateral primary osteoarthritis, right hip: Secondary | ICD-10-CM

## 2021-09-09 DIAGNOSIS — M545 Low back pain, unspecified: Secondary | ICD-10-CM | POA: Diagnosis not present

## 2021-09-09 DIAGNOSIS — G8929 Other chronic pain: Secondary | ICD-10-CM | POA: Diagnosis not present

## 2021-09-09 MED ORDER — TRAMADOL HCL 50 MG PO TABS
ORAL_TABLET | ORAL | 2 refills | Status: DC
  Filled 2021-09-09: qty 60, 10d supply, fill #0
  Filled 2021-09-17: qty 60, 10d supply, fill #1

## 2021-09-09 NOTE — Progress Notes (Signed)
Office Visit Note   Patient: Jeffrey [name]           Date of Birth: September 20, 1965           MRN: [id number] Visit Date: 09/09/2021              Requested by: Holley Bouche, MD [address] [place],  [place] [address] PCP: Holley Bouche, MD   Assessment & Plan: Visit Diagnoses:  1. Chronic bilateral low back pain without sciatica   2. Unilateral primary osteoarthritis, right hip     Plan: Impression is chronic low back pain and new onset right hip pain with underlying osteoarthritis.  In regards to his back, he has failed to get significant relief from epidural steroid injections and most recently radiofrequency ablation.  I would like to make a referral to neurosurgery for further evaluation treatment recommendation.  He agrees and would like to proceed.  In regards to the right hip, we have made referral to Dr. Ernestina Patches for cortisone injection.  Follow-up with [place] as needed.  Follow-Up Instructions: Return if symptoms worsen or fail to improve.   Orders:  Orders Placed This Encounter  Procedures   XR Pelvis 1-2 Views   Meds ordered this encounter  Medications   traMADol (ULTRAM) 50 MG tablet    Sig: Take 1-2 tablets by mouth three times daily as needed.    Dispense:  60 tablet    Refill:  2      Procedures: No procedures performed   Clinical Data: No additional findings.   Subjective: Chief Complaint  Patient presents with   Lower Back - Pain    HPI patient is a pleasant 56 year old gentleman who comes in today with recurrent bilateral low back pain with occasional radiculopathy the right lower extremity.  He has been dealing with this for years.  He has been seeing by Dr. Ernestina Patches several times in the past where he most recently underwent bilateral radiofrequency ablation L5-S1 without significant relief.  He continues to endorse pain across the entire low back.  He has recently been complaining of right groin pain which has become more constant.  All of the  symptoms appear to be worse when he is standing for longer than 5 minutes as well as at night when he is turning.  He has been taking Advil and Tylenol without significant relief.  He denies any paresthesias to the right lower extremity.  No bowel or bladder change or saddle paresthesias.  Review of Systems as detailed in HPI.  All others reviewed and are negative.   Objective: Vital Signs: There were no vitals taken for this visit.  Physical Exam well-developed well-nourished gentleman in no acute distress.  Alert and oriented x3.  Ortho Exam lumbar spine exam reveals increased pain with lumbar flexion, extension and rotation.  Positive straight leg raise on the right.  Markedly positive logroll and FADIR on the right.  He is neurovascular intact distally.  Specialty Comments:  No specialty comments available.  Imaging: No results found.   PMFS History: Patient Active Problem List   Diagnosis Date Noted   At risk for mood disturbance 09/01/2021   Sleep disturbance 07/28/2021   Chronic bilateral low back pain without sciatica 07/25/2019   Neurogenic claudication 05/19/2019   Hyperlipidemia LDL goal <70 07/20/2018   History of pulmonary embolus (PE) 07/20/2017   Mild CAD 07/20/2017   S/p total knee replacement, bilateral 02/10/2017   Chronic anticoagulation 01/26/2017   Pulmonary embolism ([place]) 06/03/2016  Encounter for chronic pain management 04/06/2014   Tobacco abuse 12/05/2013   DVT (deep venous thrombosis) ([place]) 11/28/2013   HTN (hypertension) 05/04/2013   Lumbar back pain 05/04/2013   BMI 33.0-33.9,adult 05/04/2013   Hx of substance abuse ([place]) 05/04/2013   Past Medical History:  Diagnosis Date   Acute pulmonary embolus (HCC)    bilateral submassive PE in setting of missing several doses of Xarelto for his DVT   Arthritis    DVT (deep venous thrombosis) ([place]) 11/28/2013   RT LEG   High cholesterol    Hypertension    Marijuana use     Family History  Problem  Relation Age of Onset   Heart disease Other    Arthritis Other    Alcohol abuse Mother    Heart disease Mother    Depression Mother    Hypertension Mother    Kidney disease Mother    Arthritis Mother    Clotting disorder Mother    Arthritis Father    Alcohol abuse Brother    Drug abuse Brother    Sickle cell anemia Brother    Arthritis Maternal Grandmother    Heart disease Maternal Grandmother    Arthritis Maternal Grandfather    Heart disease Maternal Grandfather    Asthma Cousin     Past Surgical History:  Procedure Laterality Date   HERNIA REPAIR     3710 and 6269; umbilical hernia repair   KNEE SURGERY Bilateral    Left knee 1993, right knee 2004   TONSILLECTOMY     TOTAL KNEE ARTHROPLASTY Left 02/10/2017   Procedure: LEFT TOTAL KNEE ARTHROPLASTY;  Surgeon: Leandrew Koyanagi, MD;  Location: [facility];  Service: Orthopedics;  Laterality: Left;   TOTAL KNEE ARTHROPLASTY Right 07/28/2017   Procedure: RIGHT TOTAL KNEE ARTHROPLASTY;  Surgeon: Leandrew Koyanagi, MD;  Location: [facility];  Service: Orthopedics;  Laterality: Right;   Social History   Occupational History   Not on file  Tobacco Use   Smoking status: Every Day    Packs/day: 0.25    Years: 35.00    Total pack years: 8.75    Types: Cigarettes    Start date: 10/07/1976   Smokeless tobacco: Never   Tobacco comments:    Stopped for up to 5 years total - Peak rate 2ppd  Vaping Use   Vaping Use: Former  Substance and Sexual Activity   Alcohol use: Yes    Alcohol/week: 12.0 standard drinks of alcohol    Types: 12 Cans of beer per week    Comment: seldom wine/liquor   Drug use: Yes    Types: Marijuana, Cocaine    Comment: Cocaine last used in 2004, current marijuana use   Sexual activity: Yes    Birth control/protection: None    Comment: With wife

## 2021-09-15 ENCOUNTER — Ambulatory Visit: Payer: 59 | Admitting: Family Medicine

## 2021-09-15 DIAGNOSIS — Z1212 Encounter for screening for malignant neoplasm of rectum: Secondary | ICD-10-CM | POA: Diagnosis not present

## 2021-09-15 DIAGNOSIS — Z1211 Encounter for screening for malignant neoplasm of colon: Secondary | ICD-10-CM | POA: Diagnosis not present

## 2021-09-16 ENCOUNTER — Ambulatory Visit: Payer: 59 | Admitting: Orthopaedic Surgery

## 2021-09-17 ENCOUNTER — Other Ambulatory Visit (HOSPITAL_COMMUNITY): Payer: Self-pay

## 2021-09-17 ENCOUNTER — Other Ambulatory Visit: Payer: Self-pay | Admitting: Student

## 2021-09-17 ENCOUNTER — Ambulatory Visit
Admission: RE | Admit: 2021-09-17 | Discharge: 2021-09-17 | Disposition: A | Discharge status: Home / Self Care | Payer: 59 | Source: Ambulatory Visit | Attending: Family Medicine | Admitting: Family Medicine

## 2021-09-17 ENCOUNTER — Encounter: Payer: Self-pay | Admitting: Physical Medicine and Rehabilitation

## 2021-09-17 ENCOUNTER — Other Ambulatory Visit: Payer: Self-pay

## 2021-09-17 ENCOUNTER — Ambulatory Visit: Payer: Self-pay

## 2021-09-17 ENCOUNTER — Other Ambulatory Visit: Payer: Self-pay | Admitting: Family Medicine

## 2021-09-17 ENCOUNTER — Ambulatory Visit (INDEPENDENT_AMBULATORY_CARE_PROVIDER_SITE_OTHER): Payer: 59 | Admitting: Physical Medicine and Rehabilitation

## 2021-09-17 DIAGNOSIS — M25551 Pain in right hip: Secondary | ICD-10-CM

## 2021-09-17 DIAGNOSIS — F17210 Nicotine dependence, cigarettes, uncomplicated: Secondary | ICD-10-CM | POA: Diagnosis not present

## 2021-09-17 DIAGNOSIS — Z122 Encounter for screening for malignant neoplasm of respiratory organs: Secondary | ICD-10-CM

## 2021-09-17 NOTE — Progress Notes (Signed)
Pt state right hip pain. Pt state walking and standing makes the pain worse. Pt state he takes pain meds to help esse his pain.  Numeric Pain Rating Scale and Functional Assessment Average Pain 7   In the last MONTH (on 0-10 scale) has pain interfered with the following?  1. General activity like being  able to carry out your everyday physical activities such as walking, climbing stairs, carrying groceries, or moving a chair?  Rating(10)   +BT, -Dye Allergies.

## 2021-09-17 NOTE — Progress Notes (Signed)
   [name] - 56 y.o. male MRN [id number]  Date of birth: 1966/02/27  Office Visit Note: Visit Date: 09/17/2021 PCP: Holley Bouche, MD Referred by: Holley Bouche, MD  Subjective: Chief Complaint  Patient presents with   Right Hip - Pain   HPI:  Jeffrey [name] is a 56 y.o. male who comes in today at the request of Tawanna Cooler, PA-C for planned Right anesthetic hip arthrogram with fluoroscopic guidance.  The patient has failed conservative care including home exercise, medications, time and activity modification.  This injection will be diagnostic and hopefully therapeutic.  Please see requesting physician notes for further details and justification.   ROS Otherwise per HPI.  Assessment & Plan: Visit Diagnoses:    ICD-10-CM   1. Pain in right hip  M25.551 Large Joint Inj: R hip joint    XR C-ARM NO REPORT      Plan: No additional findings.   Meds & Orders: No orders of the defined types were placed in this encounter.   Orders Placed This Encounter  Procedures   Large Joint Inj: R hip joint   XR C-ARM NO REPORT    Follow-up: Return if symptoms worsen or fail to improve.   Procedures: Large Joint Inj: R hip joint on 09/17/2021 9:23 AM Indications: diagnostic evaluation and pain Details: 22 G 3.5 in needle, fluoroscopy-guided anterior approach  Arthrogram: No  Medications: 4 mL bupivacaine 0.25 %; 60 mg triamcinolone acetonide 40 MG/ML Outcome: tolerated well, no immediate complications  There was excellent flow of contrast producing a partial arthrogram of the hip. The patient did have relief of symptoms during the anesthetic phase of the injection. Procedure, treatment alternatives, risks and benefits explained, specific risks discussed. Consent was given by the patient. Immediately prior to procedure a time out was called to verify the correct patient, procedure, equipment, support staff and site/side marked as required. Patient was prepped and draped in  the usual sterile fashion.          Clinical History: No specialty comments available.     Objective:  VS:  HT:    WT:   BMI:     BP:   HR: bpm  TEMP: ( )  RESP:  Physical Exam   Imaging: No results found.

## 2021-09-18 ENCOUNTER — Other Ambulatory Visit (HOSPITAL_COMMUNITY): Payer: Self-pay

## 2021-09-18 MED ORDER — APIXABAN 5 MG PO TABS
5.0000 mg | ORAL_TABLET | Freq: Two times a day (BID) | ORAL | 5 refills | Status: DC
  Filled 2021-09-18: qty 60, 30d supply, fill #0

## 2021-09-18 MED ORDER — GABAPENTIN 300 MG PO CAPS
600.0000 mg | ORAL_CAPSULE | Freq: Every day | ORAL | 1 refills | Status: DC
  Filled 2021-09-18: qty 60, 30d supply, fill #0

## 2021-09-19 ENCOUNTER — Other Ambulatory Visit (HOSPITAL_COMMUNITY): Payer: Self-pay

## 2021-09-22 ENCOUNTER — Other Ambulatory Visit (HOSPITAL_COMMUNITY): Payer: Self-pay

## 2021-09-22 ENCOUNTER — Ambulatory Visit (INDEPENDENT_AMBULATORY_CARE_PROVIDER_SITE_OTHER): Payer: 59 | Admitting: Neurology

## 2021-09-22 ENCOUNTER — Encounter: Payer: Self-pay | Admitting: Neurology

## 2021-09-22 VITALS — BP 152/113 | HR 102 | Ht 75.0 in | Wt 261.0 lb

## 2021-09-22 DIAGNOSIS — R0683 Snoring: Secondary | ICD-10-CM | POA: Diagnosis not present

## 2021-09-22 DIAGNOSIS — Z86711 Personal history of pulmonary embolism: Secondary | ICD-10-CM | POA: Diagnosis not present

## 2021-09-22 DIAGNOSIS — Z720 Tobacco use: Secondary | ICD-10-CM

## 2021-09-22 DIAGNOSIS — Z7901 Long term (current) use of anticoagulants: Secondary | ICD-10-CM

## 2021-09-22 DIAGNOSIS — Z6833 Body mass index (BMI) 33.0-33.9, adult: Secondary | ICD-10-CM | POA: Diagnosis not present

## 2021-09-22 DIAGNOSIS — F5105 Insomnia due to other mental disorder: Secondary | ICD-10-CM

## 2021-09-22 DIAGNOSIS — Z9189 Other specified personal risk factors, not elsewhere classified: Secondary | ICD-10-CM | POA: Diagnosis not present

## 2021-09-22 DIAGNOSIS — R5382 Chronic fatigue, unspecified: Secondary | ICD-10-CM | POA: Diagnosis not present

## 2021-09-22 DIAGNOSIS — F1911 Other psychoactive substance abuse, in remission: Secondary | ICD-10-CM

## 2021-09-22 MED ORDER — TRAZODONE HCL 50 MG PO TABS
50.0000 mg | ORAL_TABLET | Freq: Every evening | ORAL | 5 refills | Status: DC | PRN
  Filled 2021-09-22: qty 30, 30d supply, fill #0

## 2021-09-23 ENCOUNTER — Telehealth: Payer: Self-pay | Admitting: Neurology

## 2021-09-24 ENCOUNTER — Institutional Professional Consult (permissible substitution): Payer: 59 | Admitting: Neurology

## 2021-09-25 LAB — COLOGUARD: COLOGUARD: NEGATIVE

## 2021-09-26 MED ORDER — BUPIVACAINE HCL 0.25 % IJ SOLN
4.0000 mL | INTRAMUSCULAR | Status: AC | PRN
  Administered 2021-09-17: 4 mL via INTRA_ARTICULAR

## 2021-09-26 MED ORDER — TRIAMCINOLONE ACETONIDE 40 MG/ML IJ SUSP
60.0000 mg | INTRAMUSCULAR | Status: AC | PRN
  Administered 2021-09-17: 60 mg via INTRA_ARTICULAR

## 2021-10-06 ENCOUNTER — Ambulatory Visit (INDEPENDENT_AMBULATORY_CARE_PROVIDER_SITE_OTHER): Payer: 59 | Admitting: Neurology

## 2021-10-06 DIAGNOSIS — G4734 Idiopathic sleep related nonobstructive alveolar hypoventilation: Secondary | ICD-10-CM

## 2021-10-06 DIAGNOSIS — R0683 Snoring: Secondary | ICD-10-CM

## 2021-10-06 DIAGNOSIS — Z86711 Personal history of pulmonary embolism: Secondary | ICD-10-CM

## 2021-10-06 DIAGNOSIS — F1911 Other psychoactive substance abuse, in remission: Secondary | ICD-10-CM

## 2021-10-06 DIAGNOSIS — G4733 Obstructive sleep apnea (adult) (pediatric): Secondary | ICD-10-CM

## 2021-10-06 DIAGNOSIS — Z9189 Other specified personal risk factors, not elsewhere classified: Secondary | ICD-10-CM

## 2021-10-06 DIAGNOSIS — R5382 Chronic fatigue, unspecified: Secondary | ICD-10-CM

## 2021-10-06 DIAGNOSIS — F5105 Insomnia due to other mental disorder: Secondary | ICD-10-CM

## 2021-10-10 ENCOUNTER — Other Ambulatory Visit: Payer: Self-pay | Admitting: Neurological Surgery

## 2021-10-10 ENCOUNTER — Other Ambulatory Visit (HOSPITAL_COMMUNITY): Payer: Self-pay | Admitting: Neurological Surgery

## 2021-10-10 DIAGNOSIS — Z6832 Body mass index (BMI) 32.0-32.9, adult: Secondary | ICD-10-CM | POA: Diagnosis not present

## 2021-10-10 DIAGNOSIS — M5416 Radiculopathy, lumbar region: Secondary | ICD-10-CM

## 2021-10-10 DIAGNOSIS — M47816 Spondylosis without myelopathy or radiculopathy, lumbar region: Secondary | ICD-10-CM | POA: Diagnosis not present

## 2021-10-13 ENCOUNTER — Ambulatory Visit (HOSPITAL_COMMUNITY)
Admission: RE | Admit: 2021-10-13 | Discharge: 2021-10-13 | Disposition: A | Discharge status: Home / Self Care | Payer: 59 | Source: Ambulatory Visit | Attending: Neurological Surgery | Admitting: Neurological Surgery

## 2021-10-13 DIAGNOSIS — M5416 Radiculopathy, lumbar region: Secondary | ICD-10-CM | POA: Diagnosis not present

## 2021-10-15 ENCOUNTER — Telehealth: Payer: Self-pay | Admitting: *Deleted

## 2021-10-15 NOTE — Telephone Encounter (Signed)
-----   Message from Larey Seat, MD sent at 10/15/2021  1:35 PM EDT ----- POLYSOMNOGRAPHY IMPRESSION:   1. Severe Obstructive Sleep Apnea(OSA) manifesting in hypopneas, shallow breathing - and with prolonged , deep oxygen desaturations.  2. A SIMLUS large FFM was choses by the attendant for this patient, and CPAP was titrated to 7 cm water with good resolution of snoring, apnea and finally of hypoxia. REM sleep rebounded under CPAP.  3. The patient should avoid the supine sleep position if possible. 4. A BMI under 28 is associated with less apnea. Weight loss should not mean loss of muscle mass- consider a high protein and low carb diet.  5. Avoid alcohol , sedatives and muscle relaxants before bedtime when you have sleep apnea.     RECOMMENDATIONS: This patient will receive an autotitration CPAP device with a setting from 5-12 cm water pressure without EPR setting. Simplus FFM in large, machine by [employer] with heated humidification.    A follow up appointment will be scheduled with the NPs in the Sleep Clinic at [facility].      I certify that I have reviewed the entire raw data recording prior to the issuance of this report in accordance with the Standards of Accreditation of the [place] [employer] (AASM)

## 2021-10-15 NOTE — Telephone Encounter (Signed)
Pt has called Terrence Dupont, RN back re: results

## 2021-10-15 NOTE — Addendum Note (Signed)
Addended by: Larey Seat on: 10/15/2021 01:35 PM   Modules accepted: Orders

## 2021-10-15 NOTE — Telephone Encounter (Signed)
Called pt to go over sleep study results but he was walking out the door and could not talk at the moment. He will call back.

## 2021-10-15 NOTE — Telephone Encounter (Signed)
I called pt. I advised pt that Dr. Brett Fairy reviewed their sleep study results and found that pt has severe sleep apnea. Dr. Brett Fairy recommends that pt start auto cpap. I reviewed PAP compliance expectations with the pt. Pt is agreeable to starting a CPAP. I advised pt that an order will be sent to a DME, [facility], and [facility] will call the pt within about one week after they file with the pt's insurance. [facility] will show the pt how to use the machine, fit for masks, and troubleshoot the CPAP if needed. A follow up appt was made for insurance purposes with Amy Lomax,NP on 01/01/22 at 9am. Pt verbalized understanding to arrive 15 minutes early and bring their CPAP.Pt requested a mychart message be sent with all this info, I sent mychart message. Pt had no questions at this time but was encouraged to call back if questions arise. I have sent the order to [facility] and have received confirmation that they have received the order.

## 2021-10-15 NOTE — Procedures (Signed)
PATIENT'S NAME:  Jeffrey [name], Jeffrey [name] DOB:      Jun 10, 1965      MR#:    [id number]     DATE OF RECORDING: 10/06/2021 C. Kirk Ruths, RPSGT REFERRING M.D.:  Talbert Cage, MD, [facility] [place] Study Performed:  Split-Night Titration Study, SPLIT at AHI 20/h. HISTORY Patient is experiencing apnoeic events and snores loudly according to wife.  He has difficulty sleeping , back pain is associated , also coughing and choking sensation. 09-22-2021; I have the pleasure of seeing Jeffrey [name] today, a right-handed [place] American male with a known OSA sleep disorder.  He has a past medical history of Acute pulmonary embolus ([place]), ADHD, Arthritis, DVT (deep venous thrombosis) ([place]) (11/28/2013), High cholesterol, Hypertension, Insomnia with chronic pain, and Marijuana use. Sleep relevant medical history:  Sleep talking, Tonsillectomy in childhood- teeth removed., is awaiting back surgery , he had both knees replaced, hernia operation, DVT in both legs, leading to PE in 2015, acute SOB. He had a PSG in [place], about 15 years ago and was started on CPAP. Was able to tolerate and used CPAP for about 2 years. He has been off CPAP for at least 10 years. There is a possible PTSD component.  The patient endorsed the Epworth Sleepiness Scale at 11/24 points, FSS at 59/63 points- very severe fatigue!   The patient's weight 261 pounds with a height of 75 (inches), resulting in a BMI of 32.3 kg/m2. The patient's neck circumference measured 17 inches.  CURRENT MEDICATIONS: Norvasc, Eliquis, Zestril, Ultram, Desyrel    PROCEDURE:  This is a multichannel digital polysomnogram utilizing the Somnostar 11.2 system.  Electrodes and sensors were applied and monitored per AASM Specifications.   EEG, EOG, Chin and Limb EMG, were sampled at 200 Hz.  ECG, Snore and Nasal Pressure, Thermal Airflow, Respiratory Effort, CPAP Flow and Pressure, Oximetry was sampled at 50 Hz. Digital video and audio were recorded.      BASELINE STUDY WITHOUT  CPAP RESULTS:  Lights Out was at 21:46 and Lights On at 04:56.  Total recording time (TRT) was 245.5, with a total sleep time (TST) of 126 minutes.   The patient's sleep latency was 131 minutes.  REM latency was 0 minutes.  The sleep efficiency was 51.3 %.    SLEEP ARCHITECTURE: WASO (Wake after sleep onset) was 37.5 minutes, Stage N1 was 20 minutes, Stage N2 was 106 minutes, Stage N3 was 0 minutes and Stage R (REM sleep) was 0 minutes.  The percentages were Stage N1 15.9%, Stage N2 84.1%, Stage N3 0% and Stage R (REM sleep) 0%.     RESPIRATORY ANALYSIS:  There were a total of 101 respiratory events:  1 obstructive apnea, 0 central apneas and 0 mixed apneas with 100 hypopneas and other respiratory event related arousals (RERAs).  Snoring was noted.     The total APNEA/HYPOPNEA INDEX (AHI) was 48.1 /hour.  0 events occurred in REM sleep and 200 events in NREM. The REM AHI was 0, /hour versus a non-REM AHI of 48.1 /hour.  The patient spent 193 minutes sleep time in the supine position 71 minutes in non-supine.  The supine AHI was 64.4 /hour versus a non-supine AHI of 8.2 /hour.  OXYGEN SATURATION & C02:  The wake baseline 02 saturation was 95%, with the Nadir 02  being 79%. Time spent below 89% saturation equaled 103 minutes.  The arousals were noted as: 12 were spontaneous, 0 were associated with PLMs, 48 were associated with respiratory events.The patient  had a total of 0 Periodic Limb Movements  Audio and video analysis did not show any abnormal or unusual movements, behaviors, phonations or vocalizations The patient took bathroom breaks. Mild Snoring was noted EKG was in keeping with sinus rhythm. Severe sleep hypopnea and hypoxemia required PAP titration: SPLIT AHI was 50/h.   TITRATION STUDY WITH CPAP RESULTS:   CPAP was initiated under a Simplus large FFM-mask at 5 cmH20 with heated humidity per AASM split night standards and pressure was advanced to 7 cmH20 because of hypopneas,  apneas and desaturations.   At a PAP pressure of 7cmH20, there was a reduction of the AHI to 2.5 /hour.  Hypoxia was reduced but not completely alleviated when REM sleep rebounded.   Total recording time (TRT) was 185 minutes, with a total sleep time (TST) of 137.5 minutes. The patient's sleep latency was 3 minutes. REM latency was 7 minutes.  The sleep efficiency was 74.3 %.    SLEEP ARCHITECTURE: Wake after sleep was 28.5 minutes, Stage N1 13.5 minutes, Stage N2 60.5 minutes, Stage N3 0 minutes and Stage R (REM sleep) 63.5 minutes. The percentages were: Stage N1 9.8%, Stage N2 44.%, Stage N3 0% and Stage R (REM sleep) 46.2%.  The sleep architecture was notable for REM rebound.  RESPIRATORY ANALYSIS:  There were a total of 8 respiratory events: 1 obstructive apnea, 7 hypopneas The patient also had 0 respiratory event related arousals (RERAs).     The total APNEA/HYPOPNEA INDEX (AHI) was 3.5 /hour and the total RESPIRATORY DISTURBANCE INDEX was 3.5 /hour.  7 events occurred in REM sleep and 1 events in NREM. The REM AHI was 6.6 /hour versus a non-REM AHI of 0 .8 /hour. REM sleep was achieved on a pressure of 5,6,7 cm/h2o . The patient spent 75% of total sleep time in the supine position. The supine AHI was 4.7 /hour, versus a non-supine AHI of 0.0/hour.  OXYGEN SATURATION & C02:  The wake baseline 02 saturation was 91%, with the lowest being 88%. Time spent below 89% saturation equaled 11 minutes.   PERIODIC LIMB MOVEMENTS:   The patient had a total of 0 Periodic Limb Movements. The arousals were noted as: 13 were spontaneous, 0 were associated with PLMs, 1 was associated with respiratory events.  No oxygen was added,     POLYSOMNOGRAPHY IMPRESSION :   Severe Obstructive Sleep Apnea(OSA) manifesting in hypopneas, shallow breathing - and with prolonged , deep oxygen desaturations.  A SIMLUS large FFM was choses by the attendant for this patient, and CPAP was titrated to 7 cm water with good  resolution of snoring, apnea and finally of hypoxia. REM sleep rebounded under CPAP.  The patient should avoid the supine sleep position if possible. A BMI under 28 is associated with less apnea. Weight loss should not mean loss of muscle mass- consider a high protein and low carb diet.  Avoid alcohol , sedatives and muscle relaxants before bedtime when you have sleep apnea.     RECOMMENDATIONS: This patient will receive an autotitration CPAP device with a setting from 5-12 cm water pressure without EPR setting. Simplus FFM in large, machine by [employer] with heated humidification.    A follow up appointment will be scheduled with the NPs in the Sleep Clinic at [facility].      I certify that I have reviewed the entire raw data recording prior to the issuance of this report in accordance with the Standards of Accreditation of the [place] [employer] of  Sleep [employer] (AASM)     Larey Seat, M.D. Medical Director, [facility], [employer] of Neurology and Sleep Medicine ( Neurology and Sleep Medicine)

## 2021-10-29 NOTE — Progress Notes (Deleted)
Psychiatric Initial Adult Assessment   Patient Identification: Jeffrey [name] MRN:  [id number] Date of Evaluation:  10/29/2021 Referral Source: *** Chief Complaint:  No chief complaint on file.  Visit Diagnosis: No diagnosis found.  History of Present Illness:   Jeffrey [name] is a 56 y.o. year old male with a history of mood disorder, sleep apnea, hypertension, PE, DVT, , who is referred for irritability.    Daily routine: Diet:  Exercise: Support: Household:  Marital status: Number of children: Employment:  Education:   Last PCP / ongoing medical evaluation:           Associated Signs/Symptoms: Depression Symptoms:  {DEPRESSION SYMPTOMS:20000} (Hypo) Manic Symptoms:  {BHH MANIC SYMPTOMS:22872} Anxiety Symptoms:  {BHH ANXIETY SYMPTOMS:22873} Psychotic Symptoms:  {BHH PSYCHOTIC SYMPTOMS:22874} PTSD Symptoms: {BHH PTSD SYMPTOMS:22875}  Past Psychiatric History:  Outpatient:  Psychiatry admission:  Previous suicide attempt:  Past trials of medication:  History of violence:    Previous Psychotropic Medications: {YES/NO:21197}  Substance Abuse History in the last 12 months:  {yes no:314532}  Consequences of Substance Abuse: {BHH CONSEQUENCES OF SUBSTANCE ABUSE:22880}  Past Medical History:  Past Medical History:  Diagnosis Date   Acute pulmonary embolus ([place])    bilateral submassive PE in setting of missing several doses of Xarelto for his DVT   Arthritis    DVT (deep venous thrombosis) ([place]) 11/28/2013   RT LEG   High cholesterol    Hypertension    Marijuana use     Past Surgical History:  Procedure Laterality Date   HERNIA REPAIR     7829 and 5621; umbilical hernia repair   KNEE SURGERY Bilateral    Left knee 1993, right knee 2004   TONSILLECTOMY     TOTAL KNEE ARTHROPLASTY Left 02/10/2017   Procedure: LEFT TOTAL KNEE ARTHROPLASTY;  Surgeon: Leandrew Koyanagi, MD;  Location: [facility];  Service: Orthopedics;  Laterality: Left;   TOTAL KNEE ARTHROPLASTY  Right 07/28/2017   Procedure: RIGHT TOTAL KNEE ARTHROPLASTY;  Surgeon: Leandrew Koyanagi, MD;  Location: [facility];  Service: Orthopedics;  Laterality: Right;    Family Psychiatric History: ***  Family History:  Family History  Problem Relation Age of Onset   Heart disease Other    Arthritis Other    Alcohol abuse Mother    Heart disease Mother    Depression Mother    Hypertension Mother    Kidney disease Mother    Arthritis Mother    Clotting disorder Mother    Arthritis Father    Alcohol abuse Brother    Drug abuse Brother    Sickle cell anemia Brother    Arthritis Maternal Grandmother    Heart disease Maternal Grandmother    Arthritis Maternal Grandfather    Heart disease Maternal Grandfather    Asthma Cousin     Social History:   Social History   Socioeconomic History   Marital status: Married    Spouse name: Not on file   Number of children: Not on file   Years of education: Not on file   Highest education level: Not on file  Occupational History   Not on file  Tobacco Use   Smoking status: Every Day    Packs/day: 1.00    Years: 35.00    Total pack years: 35.00    Types: Cigarettes    Start date: 10/07/1976   Smokeless tobacco: Never   Tobacco comments:    Stopped for up to 5 years total - Peak rate 2ppd  Vaping Use   Vaping Use: Former  Substance and Sexual Activity   Alcohol use: Yes    Alcohol/week: 12.0 standard drinks of alcohol    Types: 12 Cans of beer per week    Comment: seldom wine/liquor   Drug use: Yes    Types: Marijuana    Comment: Cocaine last used in 2004, current marijuana use   Sexual activity: Yes    Birth control/protection: None    Comment: With wife  Other Topics Concern   Not on file  Social History Narrative   Lives in [place].   Chickens as pet.    Hobbies: Radiographer, therapeutic Pulmonary (11/24/16):   Originally from [place]. Has worked in Architect, Ambulance person, Press photographer, & in a warehouse. No known asbestos exposure.  Previously had chickens as pets. No mold exposure.    Social Determinants of Health   Financial Resource Strain: Not on file  Food Insecurity: Not on file  Transportation Needs: Not on file  Physical Activity: Not on file  Stress: Not on file  Social Connections: Not on file    Additional Social History: ***  Allergies:  No Known Allergies  Metabolic Disorder Labs: Lab Results  Component Value Date   HGBA1C 5.6 01/02/2021   No results found for: "PROLACTIN" Lab Results  Component Value Date   CHOL 111 01/02/2021   TRIG 52 01/02/2021   HDL 45 01/02/2021   CHOLHDL 2.5 01/02/2021   VLDL 28 05/04/2013   LDLCALC 54 01/02/2021   LDLCALC 38 10/26/2019   Lab Results  Component Value Date   TSH 0.598 05/04/2013    Therapeutic Level Labs: No results found for: "LITHIUM" No results found for: "CBMZ" No results found for: "VALPROATE"  Current Medications: Current Outpatient Medications  Medication Sig Dispense Refill   amLODipine (NORVASC) 10 MG tablet Take 1 tablet (10 mg total) by mouth daily. Please make an appointment for your yearly check up. 30 tablet 1   apixaban (ELIQUIS) 5 MG TABS tablet Take 1 tablet (5 mg total) by mouth 2 (two) times daily. 60 tablet 5   atorvastatin (LIPITOR) 40 MG tablet TAKE 1 TABLET BY MOUTH ONCE DAILY 90 tablet 2   lisinopril (ZESTRIL) 20 MG tablet Take 1 tablet (20 mg total) by mouth at bedtime. 90 tablet 3   traMADol (ULTRAM) 50 MG tablet Take 1-2 tablets by mouth three times daily as needed. 60 tablet 2   traZODone (DESYREL) 50 MG tablet Take 1 tablet  by mouth at bedtime as needed for sleep. 30 tablet 5   No current facility-administered medications for this visit.    Musculoskeletal: Strength & Muscle Tone:  N/A Gait & Station:  N/A Patient leans: N/A  Psychiatric Specialty Exam: Review of Systems  There were no vitals taken for this visit.There is no height or weight on file to calculate BMI.  General Appearance:  {Appearance:22683}  Eye Contact:  {BHH EYE CONTACT:22684}  Speech:  Clear and Coherent  Volume:  Normal  Mood:  {BHH MOOD:22306}  Affect:  {Affect (PAA):22687}  Thought Process:  Coherent  Orientation:  Full (Time, Place, and Person)  Thought Content:  Logical  Suicidal Thoughts:  {ST/HT (PAA):22692}  Homicidal Thoughts:  {ST/HT (PAA):22692}  Memory:  Immediate;   Good  Judgement:  {Judgement (PAA):22694}  Insight:  {Insight (PAA):22695}  Psychomotor Activity:  Normal  Concentration:  Concentration: Good and Attention Span: Good  Recall:  Good  Fund of Knowledge:Good  Language: Good  Akathisia:  No  Handed:  Right  AIMS (if indicated):  not done  Assets:  Communication Skills Desire for Improvement  ADL's:  Intact  Cognition: WNL  Sleep:  {BHH GOOD/FAIR/POOR:22877}   Screenings: [employer]    [facility] Office Visit from 08/29/2021 in [facility] Office Visit from 07/28/2021 in [facility] Office Visit from 01/02/2021 in [place] [facility] Office Visit from 10/26/2019 in [place] Office Visit from 07/11/2019 in [facility]  PHQ-2 Total Score '5 4 1 1 1  '$ PHQ-9 Total Score '16 14 3 3 '$ --       Assessment and Plan:  Assessment  Plan   The patient demonstrates the following risk factors for suicide: Chronic risk factors for suicide include: {Chronic Risk Factors for TWSFKCL:27517001}. Acute risk factors for suicide include: {Acute Risk Factors for VCBSWHQ:75916384}. Protective factors for this patient include: {Protective Factors for Suicide YKZL:93570177}. Considering these factors, the overall suicide risk at this point appears to be {Desc; low/moderate/high:110033}. Patient {ACTION; IS/IS LTJ:03009233} appropriate for outpatient follow up.      Collaboration of Care: {BH OP Collaboration of Care:21014065}  Patient/Guardian was advised Release of Information must be obtained  prior to any record release in order to collaborate their care with an outside provider. Patient/Guardian was advised if they have not already done so to contact the registration department to sign all necessary forms in order for [place] to release information regarding their care.   Consent: Patient/Guardian gives verbal consent for treatment and assignment of benefits for services provided during this visit. Patient/Guardian expressed understanding and agreed to proceed.   Norman Clay, MD 8/2/20233:58 PM

## 2021-10-31 DIAGNOSIS — G4733 Obstructive sleep apnea (adult) (pediatric): Secondary | ICD-10-CM | POA: Diagnosis not present

## 2021-10-31 DIAGNOSIS — Z96659 Presence of unspecified artificial knee joint: Secondary | ICD-10-CM | POA: Diagnosis not present

## 2021-10-31 DIAGNOSIS — M1711 Unilateral primary osteoarthritis, right knee: Secondary | ICD-10-CM | POA: Diagnosis not present

## 2021-11-01 ENCOUNTER — Ambulatory Visit (HOSPITAL_COMMUNITY): Payer: Self-pay | Admitting: Psychiatry

## 2021-11-26 DIAGNOSIS — M5416 Radiculopathy, lumbar region: Secondary | ICD-10-CM | POA: Diagnosis not present

## 2021-11-26 DIAGNOSIS — Z6831 Body mass index (BMI) 31.0-31.9, adult: Secondary | ICD-10-CM | POA: Diagnosis not present

## 2021-12-01 DIAGNOSIS — Z96659 Presence of unspecified artificial knee joint: Secondary | ICD-10-CM | POA: Diagnosis not present

## 2021-12-01 DIAGNOSIS — M1711 Unilateral primary osteoarthritis, right knee: Secondary | ICD-10-CM | POA: Diagnosis not present

## 2021-12-01 DIAGNOSIS — G4733 Obstructive sleep apnea (adult) (pediatric): Secondary | ICD-10-CM | POA: Diagnosis not present

## 2021-12-05 ENCOUNTER — Other Ambulatory Visit: Payer: Self-pay | Admitting: Family Medicine

## 2021-12-05 ENCOUNTER — Other Ambulatory Visit (HOSPITAL_COMMUNITY): Payer: Self-pay

## 2021-12-05 MED ORDER — ATORVASTATIN CALCIUM 40 MG PO TABS
ORAL_TABLET | Freq: Every day | ORAL | 2 refills | Status: DC
  Filled 2021-12-05: qty 90, fill #0
  Filled 2021-12-09: qty 90, 90d supply, fill #0
  Filled 2022-04-20: qty 90, 90d supply, fill #1
  Filled 2022-10-08: qty 90, 90d supply, fill #2

## 2021-12-09 ENCOUNTER — Other Ambulatory Visit (HOSPITAL_COMMUNITY): Payer: Self-pay

## 2021-12-16 ENCOUNTER — Encounter: Payer: Self-pay | Admitting: Vascular Surgery

## 2021-12-16 ENCOUNTER — Ambulatory Visit (INDEPENDENT_AMBULATORY_CARE_PROVIDER_SITE_OTHER): Payer: 59 | Admitting: Vascular Surgery

## 2021-12-16 VITALS — BP 137/92 | HR 73 | Temp 97.8°F | Resp 18 | Ht 75.5 in | Wt 251.0 lb

## 2021-12-16 DIAGNOSIS — M545 Low back pain, unspecified: Secondary | ICD-10-CM | POA: Diagnosis not present

## 2021-12-16 NOTE — Progress Notes (Signed)
[name] MRN: [id number] DOB: 09-25-1965 Sex: male  REASON FOR CONSULT: Evaluate for abdominal exposure for L3-L5 OLIF  HPI: Jeffrey [name] is a 56 y.o. male, history of hypertension, hyperlipidemia, DVT/PE in 2015 that presents for abdominal exposure for L3-L5 OLIF.  Patient describes chronic lower back pain for years.  He has failed conservative management.  He is followed by Dr. Zada Finders with neurosurgery.  In review of the images he has evidence of scoliosis and Dr. Zada Finders has recommended an L3-L4 and L4-L5 OLIF with a TLIF at L5-S1.  Patient's previous abdominal surgery includes umbilical hernia repair that was done open with mesh.  Past Medical History:  Diagnosis Date   Acute pulmonary embolus ([place])    bilateral submassive PE in setting of missing several doses of Xarelto for his DVT   Arthritis    DVT (deep venous thrombosis) ([place]) 11/28/2013   RT LEG   High cholesterol    Hypertension    Lumbar spine scoliosis    Marijuana use     Past Surgical History:  Procedure Laterality Date   HERNIA REPAIR     1829 and 9371; umbilical hernia repair   KNEE SURGERY Bilateral    Left knee 1993, right knee 2004   TONSILLECTOMY     TOTAL KNEE ARTHROPLASTY Left 02/10/2017   Procedure: LEFT TOTAL KNEE ARTHROPLASTY;  Surgeon: Leandrew Koyanagi, MD;  Location: [place] [facility];  Service: Orthopedics;  Laterality: Left;   TOTAL KNEE ARTHROPLASTY Right 07/28/2017   Procedure: RIGHT TOTAL KNEE ARTHROPLASTY;  Surgeon: Leandrew Koyanagi, MD;  Location: [facility];  Service: Orthopedics;  Laterality: Right;    Family History  Problem Relation Age of Onset   Heart disease Other    Arthritis Other    Alcohol abuse Mother    Heart disease Mother    Depression Mother    Hypertension Mother    Kidney disease Mother    Arthritis Mother    Clotting disorder Mother    Arthritis Father    Alcohol abuse Brother    Drug abuse Brother    Sickle cell anemia Brother    Arthritis Maternal  Grandmother    Heart disease Maternal Grandmother    Arthritis Maternal Grandfather    Heart disease Maternal Grandfather    Asthma Cousin     SOCIAL HISTORY: Social History   Socioeconomic History   Marital status: Married    Spouse name: Not on file   Number of children: Not on file   Years of education: Not on file   Highest education level: Not on file  Occupational History   Not on file  Tobacco Use   Smoking status: Every Day    Packs/day: 1.00    Years: 35.00    Total pack years: 35.00    Types: Cigarettes    Start date: 10/07/1976   Smokeless tobacco: Never   Tobacco comments:    Stopped for up to 5 years total - Peak rate 2ppd  Vaping Use   Vaping Use: Former  Substance and Sexual Activity   Alcohol use: Yes    Alcohol/week: 12.0 standard drinks of alcohol    Types: 12 Cans of beer per week    Comment: seldom wine/liquor   Drug use: Yes    Types: Marijuana    Comment: Cocaine last used in 2004, current marijuana use   Sexual activity: Yes    Birth control/protection: None    Comment: With wife  Other  Topics Concern   Not on file  Social History Narrative   Lives in [place].   Chickens as pet.    Hobbies: Radiographer, therapeutic Pulmonary (11/24/16):   Originally from [place]. Has worked in Architect, Ambulance person, Press photographer, & in a warehouse. No known asbestos exposure. Previously had chickens as pets. No mold exposure.    Social Determinants of Health   Financial Resource Strain: Not on file  Food Insecurity: Not on file  Transportation Needs: Not on file  Physical Activity: Not on file  Stress: Not on file  Social Connections: Not on file  Intimate Partner Violence: Not on file    No Known Allergies  Current Outpatient Medications  Medication Sig Dispense Refill   amLODipine (NORVASC) 10 MG tablet Take 1 tablet (10 mg total) by mouth daily. Please make an appointment for your yearly check up. 30 tablet 1   apixaban (ELIQUIS) 5 MG TABS tablet  Take 1 tablet (5 mg total) by mouth 2 (two) times daily. 60 tablet 5   atorvastatin (LIPITOR) 40 MG tablet TAKE 1 TABLET BY MOUTH ONCE DAILY 90 tablet 2   lisinopril (ZESTRIL) 20 MG tablet Take 1 tablet (20 mg total) by mouth at bedtime. 90 tablet 3   traMADol (ULTRAM) 50 MG tablet Take 1-2 tablets by mouth three times daily as needed. 60 tablet 2   traZODone (DESYREL) 50 MG tablet Take 1 tablet  by mouth at bedtime as needed for sleep. 30 tablet 5   No current facility-administered medications for this visit.    REVIEW OF SYSTEMS:  '[X]'$  denotes positive finding, '[ ]'$  denotes negative finding Cardiac  Comments:  Chest pain or chest pressure:    Shortness of breath upon exertion:    Short of breath when lying flat:    Irregular heart rhythm:        Vascular    Pain in calf, thigh, or hip brought on by ambulation:    Pain in feet at night that wakes you up from your sleep:     Blood clot in your veins:    Leg swelling:         Pulmonary    Oxygen at home:    Productive cough:     Wheezing:         Neurologic    Sudden weakness in arms or legs:     Sudden numbness in arms or legs:     Sudden onset of difficulty speaking or slurred speech:    Temporary loss of vision in one eye:     Problems with dizziness:         Gastrointestinal    Blood in stool:     Vomited blood:         Genitourinary    Burning when urinating:     Blood in urine:        Psychiatric    Major depression:         Hematologic    Bleeding problems:    Problems with blood clotting too easily:        Skin    Rashes or ulcers:        Constitutional    Fever or chills:      PHYSICAL EXAM: Vitals:   12/16/21 1103  BP: (!) 137/92  Pulse: 73  Resp: 18  Temp: 97.8 F (36.6 C)  TempSrc: Temporal  SpO2: 97%  Weight: 251 lb (113.9 kg)  Height: 6' 3.5" (  1.918 m)    GENERAL: The patient is a well-nourished male, in no acute distress. The vital signs are documented above. CARDIAC: There is a  regular rate and rhythm.  VASCULAR:  Palpable femoral pulses bilaterally Palpable DP pulses bilaterally PULMONARY: No respiratory distress. ABDOMEN: Soft and non-tender.  Previous incision from umbilical hernia repair. MUSCULOSKELETAL: There are no major deformities or cyanosis. NEUROLOGIC: No focal weakness or paresthesias are detected. SKIN: There are no ulcers or rashes noted. PSYCHIATRIC: The patient has a normal affect.  DATA:      Assessment/Plan:  56 year old male presents for evaluation of abdominal exposure for L3-L4 and L4-L5 OLIF in the setting of chronic lower back pain.  I have reviewed his recent MRI imaging as noted above and I think he would be a good candidate for OLIF.  Discussed him being in the right lateral position.  Discussed making an incision over the left oblique muscles at L3-L4 and L4-L5 disc space near the iliac crest.  Discussed entering the retroperitoneum and mobilized peritoneum and left ureter to the midline and mobilizing left psoas muscle lateral.  Discussed this sometimes requires mobilization of the iliac vessels as well.  Discussed risk of injury to above structures.  Questions answered.  Look forward to assisting Dr. Zada Finders.   Marty Heck, MD [facility] Office: [phone]

## 2021-12-26 ENCOUNTER — Other Ambulatory Visit: Payer: Self-pay | Admitting: Neurological Surgery

## 2021-12-30 ENCOUNTER — Other Ambulatory Visit: Payer: Self-pay

## 2021-12-31 ENCOUNTER — Telehealth: Payer: Self-pay

## 2021-12-31 DIAGNOSIS — M1711 Unilateral primary osteoarthritis, right knee: Secondary | ICD-10-CM | POA: Diagnosis not present

## 2021-12-31 DIAGNOSIS — G4733 Obstructive sleep apnea (adult) (pediatric): Secondary | ICD-10-CM | POA: Diagnosis not present

## 2021-12-31 DIAGNOSIS — Z96659 Presence of unspecified artificial knee joint: Secondary | ICD-10-CM | POA: Diagnosis not present

## 2021-12-31 NOTE — Progress Notes (Deleted)
PATIENT: Jeffrey [name] DOB: [birth year]  REASON FOR VISIT: follow up HISTORY FROM: patient  No chief complaint on file.    HISTORY OF PRESENT ILLNESS:  12/31/21 ALL:  Jeffrey [name] is a 56 y.o. male here today for follow up for OSA on CPAP. Split night study showed severe OSA with total AHI 48.1/h and prolonged deep oxygen desaturations. Events improved with no oxygen needed on CPAP therapy. AutoPAP ordered with setting sof 5-12cmH20 and Simplus FFM.   HISTORY: (copied from Dohmeier's previous note)  Jeffrey [name] is a 56 y.o.  Black or [place] American male patient seen here as a referral on 09/22/2021 from [facility] program  for a sleep study. Chief concern according to patient :  Concerned due to experiencing apnea events. Snores loudly according to wife.  He has difficulty sleeping some is because of his back and pain that is associated and not being able to comfortable but other times it because he coughs or gets choking sensation. Pt had a PSG in [place] , abut 15 years ago and was started on CPAP. Was able to tolerate and used CPAP for about 2 years. He has been off CPAP for at least 10 yrs. He was incarcerated for years.    I have the pleasure of seeing Jeffrey [name] today, a right-handed [place] or [place] American male with a known OSA sleep disorder.  He  has a past medical history of Acute pulmonary embolus ([place]), Arthritis, DVT (deep venous thrombosis) ([place]) (11/28/2013), High cholesterol, Hypertension, and Marijuana use. Sleep relevant medical history: No Nocturia , Sleep talking, Tonsillectomy in childhood- teeth removed., awaiting back surgery , had both keeps replaced, hernia operation, DVT in both legs, leading to PE in 2015, acute SOB.    Family medical /sleep history: No other family member on CPAP with OSA.    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   Family status is married.  The patient currently not working.  Pets : 2 dogs present. Tobacco use yes, 1/2 ppd.  ETOH use : 12 pack a week,  Caffeine intake in form of Coffee( /) Soda( /) Tea ( 2 -3 a week) or energy drinks..   Hobbies : cooking  Sleep habits are as follows: The patient's dinner time is between 6-7 PM. The patient goes to bed at 10-11 PM and continues to struggle to initiate sleep for 3 hours.   The preferred sleep position is sideways, with the support of 3-4 pillows.  Dreams are reportedly frequent/vivid.  4-5  AM is the usual rise time. The patient wakes up spontaneously.  He reports not feeling refreshed or restored in AM, with symptoms such as dry mouth, phlegm, but rare morning headaches, and residual fatigue.  Naps are taken frequently, lasting from  to 10-20 minutes and are refreshing than nocturnal sleep.    REVIEW OF SYSTEMS: Out of a complete 14 system review of symptoms, the patient complains only of the following symptoms, and all other reviewed systems are negative.  ESS: previously 11/24  ALLERGIES: No Known Allergies  HOME MEDICATIONS: Outpatient Medications Prior to Visit  Medication Sig Dispense Refill   amLODipine (NORVASC) 10 MG tablet Take 1 tablet (10 mg total) by mouth daily. Please make an appointment for your yearly check up. 30 tablet 1   apixaban (ELIQUIS) 5 MG TABS tablet Take  1 tablet (5 mg total) by mouth 2 (two) times daily. 60 tablet 5   atorvastatin (LIPITOR) 40 MG tablet TAKE 1 TABLET BY MOUTH ONCE DAILY 90 tablet 2   lisinopril (ZESTRIL) 20 MG tablet Take 1 tablet (20 mg total) by mouth at bedtime. 90 tablet 3   traMADol (ULTRAM) 50 MG tablet Take 1-2 tablets by mouth three times daily as needed. 60 tablet 2   traZODone (DESYREL) 50 MG tablet Take 1 tablet  by mouth at bedtime as needed for sleep. 30 tablet 5   No facility-administered medications prior to visit.    PAST MEDICAL HISTORY: Past Medical History:  Diagnosis  Date   Acute pulmonary embolus ([place])    bilateral submassive PE in setting of missing several doses of Xarelto for his DVT   Arthritis    DVT (deep venous thrombosis) ([place]) 11/28/2013   RT LEG   High cholesterol    Hypertension    Lumbar spine scoliosis    Marijuana use     PAST SURGICAL HISTORY: Past Surgical History:  Procedure Laterality Date   HERNIA REPAIR     1093 and 2355; umbilical hernia repair   KNEE SURGERY Bilateral    Left knee 1993, right knee 2004   TONSILLECTOMY     TOTAL KNEE ARTHROPLASTY Left 02/10/2017   Procedure: LEFT TOTAL KNEE ARTHROPLASTY;  Surgeon: Leandrew Koyanagi, MD;  Location: [facility];  Service: Orthopedics;  Laterality: Left;   TOTAL KNEE ARTHROPLASTY Right 07/28/2017   Procedure: RIGHT TOTAL KNEE ARTHROPLASTY;  Surgeon: Leandrew Koyanagi, MD;  Location: [facility];  Service: Orthopedics;  Laterality: Right;    FAMILY HISTORY: Family History  Problem Relation Age of Onset   Heart disease Other    Arthritis Other    Alcohol abuse Mother    Heart disease Mother    Depression Mother    Hypertension Mother    Kidney disease Mother    Arthritis Mother    Clotting disorder Mother    Arthritis Father    Alcohol abuse Brother    Drug abuse Brother    Sickle cell anemia Brother    Arthritis Maternal Grandmother    Heart disease Maternal Grandmother    Arthritis Maternal Grandfather    Heart disease Maternal Grandfather    Asthma Cousin     SOCIAL HISTORY: Social History   Socioeconomic History   Marital status: Married    Spouse name: Not on file   Number of children: Not on file   Years of education: Not on file   Highest education level: Not on file  Occupational History   Not on file  Tobacco Use   Smoking status: Every Day    Packs/day: 1.00    Years: 35.00    Total pack years: 35.00    Types: Cigarettes    Start date: 10/07/1976   Smokeless tobacco: Never   Tobacco comments:    Stopped for up to 5 years total - Peak rate 2ppd  Vaping Use    Vaping Use: Former  Substance and Sexual Activity   Alcohol use: Yes    Alcohol/week: 12.0 standard drinks of alcohol    Types: 12 Cans of beer per week    Comment: seldom wine/liquor   Drug use: Yes    Types: Marijuana    Comment: Cocaine last used in 2004, current marijuana use   Sexual activity: Yes    Birth control/protection: None    Comment: With wife  Other Topics Concern  Not on file  Social History Narrative   Lives in [place].   Chickens as pet.    Hobbies: Radiographer, therapeutic Pulmonary (11/24/16):   Originally from [place]. Has worked in Architect, Ambulance person, Press photographer, & in a warehouse. No known asbestos exposure. Previously had chickens as pets. No mold exposure.    Social Determinants of Health   Financial Resource Strain: Not on file  Food Insecurity: Not on file  Transportation Needs: Not on file  Physical Activity: Not on file  Stress: Not on file  Social Connections: Not on file  Intimate Partner Violence: Not on file     PHYSICAL EXAM  There were no vitals filed for this visit. There is no height or weight on file to calculate BMI.  Generalized: Well developed, in no acute distress  Cardiology: normal rate and rhythm, no murmur noted Respiratory: clear to auscultation bilaterally  Neurological examination  Mentation: Alert oriented to time, place, history taking. Follows all commands speech and language fluent Cranial nerve II-XII: Pupils were equal round reactive to light. Extraocular movements were full, visual field were full  Motor: The motor testing reveals 5 over 5 strength of all 4 extremities. Good symmetric motor tone is noted throughout.  Gait and station: Gait is normal.    DIAGNOSTIC DATA (LABS, IMAGING, TESTING) - I reviewed patient records, labs, notes, testing and imaging myself where available.      No data to display           Lab Results  Component Value Date   WBC 6.3 07/29/2017   HGB 13.0 07/29/2017   HCT  39.6 07/29/2017   MCV 88.6 07/29/2017   PLT 156 07/29/2017      Component Value Date/Time   NA 139 01/02/2021 1042   NA 138 12/23/2016 1240   K 4.9 01/02/2021 1042   K 3.9 12/23/2016 1240   CL 101 01/02/2021 1042   CO2 22 01/02/2021 1042   CO2 26 12/23/2016 1240   GLUCOSE 87 01/02/2021 1042   GLUCOSE 163 (H) 07/29/2017 0344   GLUCOSE 91 12/23/2016 1240   BUN 9 01/02/2021 1042   BUN 8.8 12/23/2016 1240   CREATININE 0.90 01/02/2021 1042   CREATININE 1.0 12/23/2016 1240   CALCIUM 9.5 01/02/2021 1042   CALCIUM 9.6 12/23/2016 1240   PROT 7.7 07/19/2017 1130   PROT 7.8 12/23/2016 1240   ALBUMIN 3.6 07/19/2017 1130   ALBUMIN 3.7 12/23/2016 1240   AST 40 07/19/2017 1130   AST 29 12/23/2016 1240   ALT 26 07/19/2017 1130   ALT 30 12/23/2016 1240   ALKPHOS 87 07/19/2017 1130   ALKPHOS 84 12/23/2016 1240   BILITOT 0.6 07/19/2017 1130   BILITOT 0.48 12/23/2016 1240   GFRNONAA 90 10/26/2019 1247   GFRNONAA 85 07/16/2015 1407   GFRAA 104 10/26/2019 1247   GFRAA >89 07/16/2015 1407   Lab Results  Component Value Date   CHOL 111 01/02/2021   HDL 45 01/02/2021   LDLCALC 54 01/02/2021   TRIG 52 01/02/2021   CHOLHDL 2.5 01/02/2021   Lab Results  Component Value Date   HGBA1C 5.6 01/02/2021   No results found for: "VITAMINB12" Lab Results  Component Value Date   TSH 0.598 05/04/2013     ASSESSMENT AND PLAN 56 y.o. year old male  has a past medical history of Acute pulmonary embolus ([place]), Arthritis, DVT (deep venous thrombosis) ([place]) (11/28/2013), High cholesterol, Hypertension, Lumbar spine scoliosis, and Marijuana use. here  with   No diagnosis found.    ERRICK SALTS is doing well on CPAP therapy. Compliance report reveals ***. *** was encouraged to continue using CPAP nightly and for greater than 4 hours each night. We will update supply orders as indicated. Risks of untreated sleep apnea review and education materials provided. Healthy lifestyle habits encouraged.  *** will follow up in ***, sooner if needed. *** verbalizes understanding and agreement with this plan.    No orders of the defined types were placed in this encounter.    No orders of the defined types were placed in this encounter.     Debbora Presto, FNP-C 12/31/2021, 11:52 AM [facility] [address] [place], [place] [address] [phone]

## 2021-12-31 NOTE — Telephone Encounter (Signed)
Called pt and LVM remind pt to bring machine/power cord to appt

## 2022-01-01 ENCOUNTER — Encounter: Payer: Self-pay | Admitting: Family Medicine

## 2022-01-01 ENCOUNTER — Ambulatory Visit: Payer: 59 | Admitting: Family Medicine

## 2022-01-01 DIAGNOSIS — G4733 Obstructive sleep apnea (adult) (pediatric): Secondary | ICD-10-CM

## 2022-01-05 NOTE — Pre-Procedure Instructions (Signed)
Surgical Instructions    Your procedure is scheduled on Wednesday, January 14, 2022 at 8:30 AM.  Report to [facility] Main Entrance "A" at 6:30 A.M., then check in with the Admitting office.  Call this number if you have problems the morning of surgery:  [phone]   If you have any questions prior to your surgery date call [phone]: Open Monday-Friday 8am-4pm  *If you experience any cold or flu symptoms such as cough, fever, chills, shortness of breath, etc. between now and your scheduled surgery, please notify us.*    Remember:  Do not eat after midnight the night before your surgery  You may drink clear liquids until 5:30 AM the morning of your surgery.   Clear liquids allowed are: Water, Non-Citrus Juices (without pulp), Carbonated Beverages, Clear Tea, Black Coffee Only (NO MILK, CREAM OR POWDERED CREAMER of any kind), and Gatorade.    Take these medicines the morning of surgery with A SIP OF WATER:  amLODipine (NORVASC) atorvastatin (LIPITOR)  Follow your surgeon's instructions on when to stop apixaban (ELIQUIS).  If no instructions were given by your surgeon then you will need to call the office to get those instructions.     As of today, STOP taking any Aspirin (unless otherwise instructed by your surgeon) Aleve, Naproxen, Ibuprofen, Motrin, Advil, Goody's, BC's, all herbal medications, fish oil, and all vitamins.                     Do NOT Smoke (Tobacco/Vaping) for 24 hours prior to your procedure.  If you use a CPAP at night, you may bring your mask/headgear for your overnight stay.   Contacts, glasses, piercing's, hearing aid's, dentures or partials may not be worn into surgery, please bring cases for these belongings.    For patients admitted to the hospital, discharge time will be determined by your treatment team.   Patients discharged the day of surgery will not be allowed to drive home, and someone needs to stay with them for 24 hours.  SURGICAL  WAITING ROOM VISITATION Patients having surgery or a procedure may have two support people in the waiting area. Visitors may stay in the waiting area during the procedure and switch out with other visitors if needed. Children under the age of 49 must have an adult accompany them who is not the patient. If the patient needs to stay at the hospital during part of their recovery, the visitor guidelines for inpatient rooms apply.  Please refer to the [facility] website for the visitor guidelines for Inpatients (after your surgery is over and you are in a regular room).    Special instructions:   [facility]- Preparing For Surgery  Before surgery, you can play an important role. Because skin is not sterile, your skin needs to be as free of germs as possible. You can reduce the number of germs on your skin by washing with CHG (chlorahexidine gluconate) Soap before surgery.  CHG is an antiseptic cleaner which kills germs and bonds with the skin to continue killing germs even after washing.    Oral Hygiene is also important to reduce your risk of infection.  Remember - BRUSH YOUR TEETH THE MORNING OF SURGERY WITH YOUR REGULAR TOOTHPASTE  Please do not use if you have an allergy to CHG or antibacterial soaps. If your skin becomes reddened/irritated stop using the CHG.  Do not shave (including legs and underarms) for at least 48 hours prior to first CHG shower. It is  OK to shave your face.  Please follow these instructions carefully.   Shower the NIGHT BEFORE SURGERY and the MORNING OF SURGERY  If you chose to wash your hair, wash your hair first as usual with your normal shampoo.  After you shampoo, rinse your hair and body thoroughly to remove the shampoo.  Use CHG Soap as you would any other liquid soap. You can apply CHG directly to the skin and wash gently with a scrungie or a clean washcloth.   Apply the CHG Soap to your body ONLY FROM THE NECK DOWN.  Do not use on open wounds or open sores.  Avoid contact with your eyes, ears, mouth and genitals (private parts). Wash Face and genitals (private parts)  with your normal soap.   Wash thoroughly, paying special attention to the area where your surgery will be performed.  Thoroughly rinse your body with warm water from the neck down.  DO NOT shower/wash with your normal soap after using and rinsing off the CHG Soap.  Pat yourself dry with a CLEAN TOWEL.  Wear CLEAN PAJAMAS to bed the night before surgery  Place CLEAN SHEETS on your bed the night before your surgery  DO NOT SLEEP WITH PETS.   Day of Surgery: Take a shower with CHG soap. Do not wear jewelry. Do not wear lotions, powders, perfumes/colognes, or deodorant. Do not shave 48 hours prior to surgery.  Men may shave face and neck. Do not bring valuables to the hospital.  [facility] is not responsible for any belongings or valuables. Wear Clean/Comfortable clothing the morning of surgery Do not apply any deodorants/lotions.   Remember to brush your teeth WITH YOUR REGULAR TOOTHPASTE.   Please read over the following fact sheets that you were given.  If you received a COVID test during your pre-op visit  it is requested that you wear a mask when out in public, stay away from anyone that may not be feeling well and notify your surgeon if you develop symptoms. If you have been in contact with anyone that has tested positive in the last 10 days please notify you surgeon.

## 2022-01-06 ENCOUNTER — Ambulatory Visit: Payer: 59 | Admitting: Adult Health

## 2022-01-06 ENCOUNTER — Other Ambulatory Visit: Payer: Self-pay

## 2022-01-06 ENCOUNTER — Encounter (HOSPITAL_COMMUNITY): Payer: Self-pay

## 2022-01-06 ENCOUNTER — Encounter (HOSPITAL_COMMUNITY)
Admission: RE | Admit: 2022-01-06 | Discharge: 2022-01-06 | Disposition: A | Discharge status: Home / Self Care | Payer: 59 | Source: Ambulatory Visit | Attending: Neurological Surgery | Admitting: Neurological Surgery

## 2022-01-06 VITALS — BP 139/95 | HR 81 | Temp 97.9°F | Resp 18 | Ht 76.0 in | Wt 252.2 lb

## 2022-01-06 DIAGNOSIS — I10 Essential (primary) hypertension: Secondary | ICD-10-CM | POA: Diagnosis not present

## 2022-01-06 DIAGNOSIS — Z01818 Encounter for other preprocedural examination: Secondary | ICD-10-CM | POA: Diagnosis not present

## 2022-01-06 HISTORY — DX: Sleep apnea, unspecified: G47.30

## 2022-01-06 LAB — CBC
HCT: 45.9 % (ref 39.0–52.0)
Hemoglobin: 15.1 g/dL (ref 13.0–17.0)
MCH: 31.5 pg (ref 26.0–34.0)
MCHC: 32.9 g/dL (ref 30.0–36.0)
MCV: 95.8 fL (ref 80.0–100.0)
Platelets: 121 10*3/uL — ABNORMAL LOW (ref 150–400)
RBC: 4.79 MIL/uL (ref 4.22–5.81)
RDW: 13.8 % (ref 11.5–15.5)
WBC: 5.8 10*3/uL (ref 4.0–10.5)
nRBC: 0 % (ref 0.0–0.2)

## 2022-01-06 LAB — COMPREHENSIVE METABOLIC PANEL
ALT: 58 U/L — ABNORMAL HIGH (ref 0–44)
AST: 59 U/L — ABNORMAL HIGH (ref 15–41)
Albumin: 3.7 g/dL (ref 3.5–5.0)
Alkaline Phosphatase: 64 U/L (ref 38–126)
Anion gap: 6 (ref 5–15)
BUN: 6 mg/dL (ref 6–20)
CO2: 25 mmol/L (ref 22–32)
Calcium: 8.8 mg/dL — ABNORMAL LOW (ref 8.9–10.3)
Chloride: 105 mmol/L (ref 98–111)
Creatinine, Ser: 0.9 mg/dL (ref 0.61–1.24)
GFR, Estimated: >60 mL/min (ref >60)
Glucose, Bld: 108 mg/dL — ABNORMAL HIGH (ref 70–99)
Potassium: 4 mmol/L (ref 3.5–5.1)
Sodium: 136 mmol/L (ref 135–145)
Total Bilirubin: 0.7 mg/dL (ref 0.3–1.2)
Total Protein: 6.9 g/dL (ref 6.5–8.1)

## 2022-01-06 LAB — SURGICAL PCR SCREEN
MRSA, PCR: NEGATIVE
Staphylococcus aureus: NEGATIVE

## 2022-01-06 NOTE — Progress Notes (Signed)
PCP - [facility] for sleep apnea  PPM/ICD - denies   Chest x-ray - n/a EKG - 01/06/22 Stress Test - denies ECHO - 09/22/16 Cardiac Cath - denies  Sleep Study - +OSA CPAP - 4.0- wears nightly   Follow your surgeon's instructions on when to stop Aspirin.  If no instructions were given by your surgeon then you will need to call the office to get those instructions.   Will stop Eliquis 3 days prior to surgery as instructed per vascular per patient.   ERAS Protcol -yes   COVID TEST- not needed   Anesthesia review: yes, review echo and has history of sleep apnea- make sure everything looks good  Patient denies shortness of breath, fever, cough and chest pain at PAT appointment   All instructions explained to the patient, with a verbal understanding of the material. Patient agrees to go over the instructions while at home for a better understanding. Patient also instructed to self quarantine after being tested for COVID-19. The opportunity to ask questions was provided.

## 2022-01-07 ENCOUNTER — Encounter (HOSPITAL_COMMUNITY)
Admission: RE | Admit: 2022-01-07 | Discharge: 2022-01-07 | Disposition: A | Discharge status: Home / Self Care | Payer: 59 | Source: Ambulatory Visit | Attending: Neurological Surgery | Admitting: Neurological Surgery

## 2022-01-07 DIAGNOSIS — Z01812 Encounter for preprocedural laboratory examination: Secondary | ICD-10-CM | POA: Diagnosis not present

## 2022-01-07 DIAGNOSIS — Z01818 Encounter for other preprocedural examination: Secondary | ICD-10-CM

## 2022-01-07 LAB — TYPE AND SCREEN
ABO/RH(D): B POS
Antibody Screen: POSITIVE
PT AG Type: NEGATIVE

## 2022-01-07 NOTE — Progress Notes (Addendum)
Anesthesia Chart Review:  Patient has history of recurrent DVT on Xarelto and then developed massive bilateral PEs with right heart strain 05/2016 after missing several doses of Xarelto.(now on eliquis).  Initial EKG worrisome for STEMI but repeat EKG strain felt secondary to PE.  Echo showed RV strain and mild RV dysfunction. Saw cardiologist Dr. Radford Pax 08/05/16. Repeat echo showed normal LVEF with moderate LVH and mild LAE. Coronary CTA showed high calcium score with mild nonobstructive CAD-aggressive risk factor modification recommended.   Hematology notes from 2018 indicate the patient had a negative hypercoagulable work-up but he was recommended to remain on lifelong Eliquis and would need bridging with Lovenox for any future procedures.  Patient did have Lovenox bridge for left TKA 2018 and right TKA 2019.  I reached out to Dr. Colleen Can surgical scheduler and she said she had received bridging recommendation from patient's PCP and was working to get it arranged.  Current every day smoker.  Recent diagnosis of OSA, now on CPAP.  Preop labs reviewed, mildly elevated AST and ALT at 59 and 58 respectively, mild thrombocytopenia with platelets 121k, otherwise unremarkable.  Addendum 01/13/2022: Received call from Dr. Zada Finders surgical scheduler.  Dr. Zada Finders has reviewed patient's hematologic history and feels comfortable with patient proceeding without Lovenox bridge.  Coronary CT 10/13/2016: IMPRESSION: 1. High coronary calcium score of 392. This was 19 percentile for age and sex matched control.   2. Normal coronary origin with right dominance.   3. Mild non-obstructive CAD. Aggressive risk factor modification is recommended.   4. Dilated pulmonary artery measuring 35 mm consistent with pulmonary hypertension.   TTE 09/22/2016: - Left ventricle: The cavity size was normal. Wall thickness was    increased in a pattern of moderate LVH. Systolic function was    normal. The estimated  ejection fraction was in the range of 60%    to 65%. Wall motion was normal; there were no regional wall    motion abnormalities. Left ventricular diastolic function    parameters were normal.  - Left atrium: The atrium was mildly dilated.  - Atrial septum: No defect or patent    Wynonia Musty [facility] Short Stay Center/Anesthesiology Phone [phone] 01/08/2022 3:08 PM

## 2022-01-08 NOTE — Anesthesia Preprocedure Evaluation (Addendum)
Anesthesia Evaluation  Patient identified by MRN, date of birth, ID band Patient awake    Reviewed: Allergy & Precautions, NPO status , Patient's Chart, lab work & pertinent test results  History of Anesthesia Complications Negative for: history of anesthetic complications  Airway Mallampati: II  TM Distance: >3 FB Neck ROM: Full    Dental no notable dental hx. (+) Dental Advisory Given   Pulmonary Current Smoker and Patient abstained from smoking., PE   Pulmonary exam normal        Cardiovascular hypertension, Pt. on medications + CAD  Normal cardiovascular exam     Neuro/Psych    GI/Hepatic negative GI ROS, Neg liver ROS,,,  Endo/Other  negative endocrine ROS    Renal/GU negative Renal ROS     Musculoskeletal negative musculoskeletal ROS (+)    Abdominal   Peds  Hematology negative hematology ROS (+)   Anesthesia Other Findings   Reproductive/Obstetrics                             Anesthesia Physical Anesthesia Plan  ASA: 3  Anesthesia Plan: General   Post-op Pain Management: Tylenol PO (pre-op)*   Induction:   PONV Risk Score and Plan: 3 and Ondansetron, Dexamethasone, Midazolam and Scopolamine patch - Pre-op  Airway Management Planned: Oral ETT  Additional Equipment: Arterial line and CVP  Intra-op Plan:   Post-operative Plan: Extubation in OR  Informed Consent: I have reviewed the patients History and Physical, chart, labs and discussed the procedure including the risks, benefits and alternatives for the proposed anesthesia with the patient or authorized representative who has indicated his/her understanding and acceptance.       Plan Discussed with: Anesthesiologist and CRNA  Anesthesia Plan Comments: (PAT note by Karoline Caldwell, PA-C: Patient has history of recurrent DVT on Xarelto and then developed massive bilateral PEs with right heart strain 05/2016 after  missing several doses of Xarelto.(now on eliquis). Initial EKG worrisome for STEMI but repeat EKG strain felt secondary to PE.  Echo showed RV strain and mild RV dysfunction. Saw cardiologist Dr. Radford Pax 08/05/16. Repeat echo showed normal LVEF with moderate LVH and mild LAE. Coronary CTA showed high calcium score with mild nonobstructive CAD-aggressive risk factor modification recommended.   Hematology notes from 2018 indicate the patient had a negative hypercoagulable work-up but he was recommended to remain on lifelong Eliquis and would need bridging with Lovenox for any future procedures.  Patient did have Lovenox bridge for left TKA 2018 and right TKA 2019.  I reached out to Dr. Colleen Can surgical scheduler and she said she had received bridging recommendation from patient's PCP and was working to get it arranged.  Current every day smoker.  Recent diagnosis of OSA, now on CPAP.  Preop labs reviewed, mildly elevated AST and ALT at 59 and 58 respectively, mild thrombocytopenia with platelets 121k, otherwise unremarkable.  Addendum 01/13/2022: Received call from Dr. Zada Finders surgical scheduler.  Dr. Zada Finders has reviewed patient's hematologic history and feels comfortable with patient proceeding without Lovenox bridge.  Coronary CT 10/13/2016: IMPRESSION: 1. High coronary calcium score of 392. This was 44 percentile for age and sex matched control.  2. Normal coronary origin with right dominance.  3. Mild non-obstructive CAD. Aggressive risk factor modification is recommended.  4. Dilated pulmonary artery measuring 35 mm consistent with pulmonary hypertension.  TTE 09/22/2016: - Left ventricle: The cavity size was normal. Wall thickness was  increased in a pattern of moderate  LVH. Systolic function was  normal. The estimated ejection fraction was in the range of 60%  to 65%. Wall motion was normal; there were no regional wall  motion abnormalities. Left ventricular  diastolic function  parameters were normal.  - Left atrium: The atrium was mildly dilated.  - Atrial septum: No defect or patent   )        Anesthesia Quick Evaluation

## 2022-01-14 ENCOUNTER — Other Ambulatory Visit: Payer: Self-pay

## 2022-01-14 ENCOUNTER — Inpatient Hospital Stay (HOSPITAL_COMMUNITY)
Admission: RE | Admit: 2022-01-14 | Discharge: 2022-01-17 | DRG: 455 | Disposition: A | Discharge status: Home / Self Care | Payer: 59 | Attending: Neurological Surgery | Admitting: Neurological Surgery

## 2022-01-14 ENCOUNTER — Inpatient Hospital Stay (HOSPITAL_COMMUNITY): Payer: 59 | Admitting: Certified Registered Nurse Anesthetist

## 2022-01-14 ENCOUNTER — Inpatient Hospital Stay (HOSPITAL_COMMUNITY): Payer: 59 | Admitting: Physician Assistant

## 2022-01-14 ENCOUNTER — Encounter (HOSPITAL_COMMUNITY): Payer: Self-pay | Admitting: Neurological Surgery

## 2022-01-14 ENCOUNTER — Inpatient Hospital Stay (HOSPITAL_COMMUNITY): Payer: 59

## 2022-01-14 ENCOUNTER — Inpatient Hospital Stay (HOSPITAL_COMMUNITY)
Admission: RE | Disposition: A | Discharge status: Home / Self Care | Payer: Self-pay | Source: Home / Self Care | Attending: Neurological Surgery

## 2022-01-14 DIAGNOSIS — Z7901 Long term (current) use of anticoagulants: Secondary | ICD-10-CM

## 2022-01-14 DIAGNOSIS — I2510 Atherosclerotic heart disease of native coronary artery without angina pectoris: Secondary | ICD-10-CM

## 2022-01-14 DIAGNOSIS — F1290 Cannabis use, unspecified, uncomplicated: Secondary | ICD-10-CM | POA: Diagnosis present

## 2022-01-14 DIAGNOSIS — Z981 Arthrodesis status: Secondary | ICD-10-CM | POA: Diagnosis not present

## 2022-01-14 DIAGNOSIS — Z86718 Personal history of other venous thrombosis and embolism: Secondary | ICD-10-CM

## 2022-01-14 DIAGNOSIS — I10 Essential (primary) hypertension: Secondary | ICD-10-CM

## 2022-01-14 DIAGNOSIS — Z832 Family history of diseases of the blood and blood-forming organs and certain disorders involving the immune mechanism: Secondary | ICD-10-CM

## 2022-01-14 DIAGNOSIS — F17290 Nicotine dependence, other tobacco product, uncomplicated: Secondary | ICD-10-CM | POA: Diagnosis not present

## 2022-01-14 DIAGNOSIS — M4326 Fusion of spine, lumbar region: Secondary | ICD-10-CM | POA: Diagnosis not present

## 2022-01-14 DIAGNOSIS — M419 Scoliosis, unspecified: Secondary | ICD-10-CM | POA: Diagnosis present

## 2022-01-14 DIAGNOSIS — E7800 Pure hypercholesterolemia, unspecified: Secondary | ICD-10-CM | POA: Diagnosis not present

## 2022-01-14 DIAGNOSIS — Z96652 Presence of left artificial knee joint: Secondary | ICD-10-CM | POA: Diagnosis present

## 2022-01-14 DIAGNOSIS — M4186 Other forms of scoliosis, lumbar region: Secondary | ICD-10-CM | POA: Diagnosis not present

## 2022-01-14 DIAGNOSIS — G8929 Other chronic pain: Secondary | ICD-10-CM | POA: Diagnosis present

## 2022-01-14 DIAGNOSIS — Z841 Family history of disorders of kidney and ureter: Secondary | ICD-10-CM | POA: Diagnosis not present

## 2022-01-14 DIAGNOSIS — Z813 Family history of other psychoactive substance abuse and dependence: Secondary | ICD-10-CM | POA: Diagnosis not present

## 2022-01-14 DIAGNOSIS — R2689 Other abnormalities of gait and mobility: Secondary | ICD-10-CM | POA: Diagnosis present

## 2022-01-14 DIAGNOSIS — M532X6 Spinal instabilities, lumbar region: Secondary | ICD-10-CM | POA: Diagnosis not present

## 2022-01-14 DIAGNOSIS — F17210 Nicotine dependence, cigarettes, uncomplicated: Secondary | ICD-10-CM | POA: Diagnosis not present

## 2022-01-14 DIAGNOSIS — Z8249 Family history of ischemic heart disease and other diseases of the circulatory system: Secondary | ICD-10-CM | POA: Diagnosis not present

## 2022-01-14 DIAGNOSIS — Z79899 Other long term (current) drug therapy: Secondary | ICD-10-CM

## 2022-01-14 DIAGNOSIS — F17200 Nicotine dependence, unspecified, uncomplicated: Secondary | ICD-10-CM | POA: Diagnosis not present

## 2022-01-14 DIAGNOSIS — Z8261 Family history of arthritis: Secondary | ICD-10-CM | POA: Diagnosis not present

## 2022-01-14 DIAGNOSIS — Z811 Family history of alcohol abuse and dependence: Secondary | ICD-10-CM

## 2022-01-14 DIAGNOSIS — Z86711 Personal history of pulmonary embolism: Secondary | ICD-10-CM | POA: Diagnosis not present

## 2022-01-14 DIAGNOSIS — Z818 Family history of other mental and behavioral disorders: Secondary | ICD-10-CM | POA: Diagnosis not present

## 2022-01-14 DIAGNOSIS — M438X6 Other specified deforming dorsopathies, lumbar region: Secondary | ICD-10-CM

## 2022-01-14 DIAGNOSIS — M545 Low back pain, unspecified: Principal | ICD-10-CM

## 2022-01-14 DIAGNOSIS — Z825 Family history of asthma and other chronic lower respiratory diseases: Secondary | ICD-10-CM

## 2022-01-14 DIAGNOSIS — M47816 Spondylosis without myelopathy or radiculopathy, lumbar region: Secondary | ICD-10-CM | POA: Diagnosis not present

## 2022-01-14 DIAGNOSIS — M5416 Radiculopathy, lumbar region: Secondary | ICD-10-CM | POA: Diagnosis not present

## 2022-01-14 HISTORY — PX: ABDOMINAL EXPOSURE: SHX5708

## 2022-01-14 HISTORY — PX: ANTERIOR LUMBAR FUSION: SHX1170

## 2022-01-14 HISTORY — PX: TRANSFORAMINAL LUMBAR INTERBODY FUSION W/ MIS 1 LEVEL: SHX6145

## 2022-01-14 LAB — POCT I-STAT 7, (LYTES, BLD GAS, ICA,H+H)
Acid-base deficit: 3 mmol/L — ABNORMAL HIGH (ref 0.0–2.0)
Bicarbonate: 23.7 mmol/L (ref 20.0–28.0)
Calcium, Ion: 1.08 mmol/L — ABNORMAL LOW (ref 1.15–1.40)
HCT: 42 % (ref 39.0–52.0)
Hemoglobin: 14.3 g/dL (ref 13.0–17.0)
O2 Saturation: 100 %
Patient temperature: 36.2
Potassium: 4.2 mmol/L (ref 3.5–5.1)
Sodium: 135 mmol/L (ref 135–145)
TCO2: 25 mmol/L (ref 22–32)
pCO2 arterial: 45 mmHg (ref 32–48)
pH, Arterial: 7.326 — ABNORMAL LOW (ref 7.35–7.45)
pO2, Arterial: 267 mmHg — ABNORMAL HIGH (ref 83–108)

## 2022-01-14 SURGERY — ANTERIOR LUMBAR FUSION 2 LEVELS
Anesthesia: General

## 2022-01-14 MED ORDER — CHLORHEXIDINE GLUCONATE CLOTH 2 % EX PADS: 6.0000 | MEDICATED_PAD | Freq: Once | CUTANEOUS | Status: DC

## 2022-01-14 MED ORDER — LISINOPRIL 20 MG PO TABS
20.0000 mg | ORAL_TABLET | Freq: Every day | ORAL | Status: DC
  Administered 2022-01-14 – 2022-01-16 (×3): 20 mg via ORAL
  Filled 2022-01-14 (×3): qty 1

## 2022-01-14 MED ORDER — OXYCODONE HCL 5 MG PO TABS: 5.0000 mg | ORAL_TABLET | ORAL | Status: DC | PRN

## 2022-01-14 MED ORDER — LIDOCAINE 2% (20 MG/ML) 5 ML SYRINGE
INTRAMUSCULAR | Status: DC | PRN
  Administered 2022-01-14: 100 mg via INTRAVENOUS

## 2022-01-14 MED ORDER — THROMBIN 5000 UNITS EX SOLR: OROMUCOSAL | Status: DC | PRN

## 2022-01-14 MED ORDER — FENTANYL CITRATE (PF) 100 MCG/2ML IJ SOLN
25.0000 ug | INTRAMUSCULAR | Status: DC | PRN
  Administered 2022-01-14: 50 ug via INTRAVENOUS
  Administered 2022-01-14 (×4): 25 ug via INTRAVENOUS

## 2022-01-14 MED ORDER — ROCURONIUM BROMIDE 10 MG/ML (PF) SYRINGE
PREFILLED_SYRINGE | INTRAVENOUS | Status: AC
  Filled 2022-01-14: qty 10

## 2022-01-14 MED ORDER — CHLORHEXIDINE GLUCONATE 0.12 % MT SOLN
15.0000 mL | Freq: Once | OROMUCOSAL | Status: AC
  Administered 2022-01-14: 15 mL via OROMUCOSAL
  Filled 2022-01-14: qty 15

## 2022-01-14 MED ORDER — FENTANYL CITRATE (PF) 250 MCG/5ML IJ SOLN
INTRAMUSCULAR | Status: AC
  Filled 2022-01-14: qty 5

## 2022-01-14 MED ORDER — ACETAMINOPHEN 500 MG PO TABS
1000.0000 mg | ORAL_TABLET | Freq: Once | ORAL | Status: AC
  Administered 2022-01-14: 1000 mg via ORAL
  Filled 2022-01-14: qty 2

## 2022-01-14 MED ORDER — SUGAMMADEX SODIUM 200 MG/2ML IV SOLN
INTRAVENOUS | Status: DC | PRN
  Administered 2022-01-14: 400 mg via INTRAVENOUS

## 2022-01-14 MED ORDER — ESMOLOL HCL 100 MG/10ML IV SOLN
INTRAVENOUS | Status: DC | PRN
  Administered 2022-01-14: 20 mg via INTRAVENOUS
  Administered 2022-01-14 (×3): 10 mg via INTRAVENOUS

## 2022-01-14 MED ORDER — PROPOFOL 500 MG/50ML IV EMUL
INTRAVENOUS | Status: DC | PRN
  Administered 2022-01-14: 100 ug/kg/min via INTRAVENOUS
  Administered 2022-01-14: 50 ug/kg/min via INTRAVENOUS
  Administered 2022-01-14: 75 ug/kg/min via INTRAVENOUS

## 2022-01-14 MED ORDER — MIDAZOLAM HCL 2 MG/2ML IJ SOLN
INTRAMUSCULAR | Status: AC
  Filled 2022-01-14: qty 2

## 2022-01-14 MED ORDER — ONDANSETRON HCL 4 MG/2ML IJ SOLN
INTRAMUSCULAR | Status: AC
  Filled 2022-01-14: qty 2

## 2022-01-14 MED ORDER — PROPOFOL 10 MG/ML IV BOLUS
INTRAVENOUS | Status: AC
  Filled 2022-01-14: qty 20

## 2022-01-14 MED ORDER — THROMBIN 20000 UNITS EX SOLR
CUTANEOUS | Status: DC | PRN
  Administered 2022-01-14: 20 mL via TOPICAL

## 2022-01-14 MED ORDER — ACETAMINOPHEN 10 MG/ML IV SOLN
INTRAVENOUS | Status: DC | PRN
  Administered 2022-01-14: 1000 mg via INTRAVENOUS

## 2022-01-14 MED ORDER — ALBUTEROL SULFATE HFA 108 (90 BASE) MCG/ACT IN AERS
INHALATION_SPRAY | RESPIRATORY_TRACT | Status: DC | PRN
  Administered 2022-01-14: 2 via RESPIRATORY_TRACT
  Administered 2022-01-14: 4 via RESPIRATORY_TRACT

## 2022-01-14 MED ORDER — ONDANSETRON HCL 4 MG PO TABS: 4.0000 mg | ORAL_TABLET | Freq: Four times a day (QID) | ORAL | Status: DC | PRN

## 2022-01-14 MED ORDER — DEXAMETHASONE SODIUM PHOSPHATE 10 MG/ML IJ SOLN
INTRAMUSCULAR | Status: AC
  Filled 2022-01-14: qty 1

## 2022-01-14 MED ORDER — ORAL CARE MOUTH RINSE: 15.0000 mL | Freq: Once | OROMUCOSAL | Status: AC

## 2022-01-14 MED ORDER — THROMBIN 20000 UNITS EX SOLR
CUTANEOUS | Status: AC
  Filled 2022-01-14: qty 20000

## 2022-01-14 MED ORDER — AMISULPRIDE (ANTIEMETIC) 5 MG/2ML IV SOLN: 10.0000 mg | Freq: Once | INTRAVENOUS | Status: DC | PRN

## 2022-01-14 MED ORDER — FENTANYL CITRATE (PF) 100 MCG/2ML IJ SOLN
INTRAMUSCULAR | Status: AC
  Filled 2022-01-14: qty 2

## 2022-01-14 MED ORDER — PHENYLEPHRINE HCL-NACL 20-0.9 MG/250ML-% IV SOLN
INTRAVENOUS | Status: AC
  Filled 2022-01-14: qty 250

## 2022-01-14 MED ORDER — THROMBIN 5000 UNITS EX SOLR
CUTANEOUS | Status: AC
  Filled 2022-01-14: qty 5000

## 2022-01-14 MED ORDER — POLYETHYLENE GLYCOL 3350 17 G PO PACK: 17.0000 g | PACK | Freq: Every day | ORAL | Status: DC | PRN

## 2022-01-14 MED ORDER — 0.9 % SODIUM CHLORIDE (POUR BTL) OPTIME
TOPICAL | Status: DC | PRN
  Administered 2022-01-14: 1000 mL

## 2022-01-14 MED ORDER — CYCLOBENZAPRINE HCL 10 MG PO TABS: 10.0000 mg | ORAL_TABLET | Freq: Three times a day (TID) | ORAL | Status: DC | PRN

## 2022-01-14 MED ORDER — ACETAMINOPHEN 650 MG RE SUPP: 650.0000 mg | RECTAL | Status: DC | PRN

## 2022-01-14 MED ORDER — MIDAZOLAM HCL 2 MG/2ML IJ SOLN
INTRAMUSCULAR | Status: DC | PRN
  Administered 2022-01-14: 2 mg via INTRAVENOUS

## 2022-01-14 MED ORDER — SODIUM CHLORIDE 0.9% FLUSH: 3.0000 mL | INTRAVENOUS | Status: DC | PRN

## 2022-01-14 MED ORDER — LACTATED RINGERS IV SOLN: INTRAVENOUS | Status: DC | PRN

## 2022-01-14 MED ORDER — OXYCODONE HCL 5 MG PO TABS
10.0000 mg | ORAL_TABLET | ORAL | Status: DC | PRN
  Administered 2022-01-14 – 2022-01-16 (×7): 10 mg via ORAL
  Filled 2022-01-14 (×7): qty 2

## 2022-01-14 MED ORDER — PHENOL 1.4 % MT LIQD: 1.0000 | OROMUCOSAL | Status: DC | PRN

## 2022-01-14 MED ORDER — DOCUSATE SODIUM 100 MG PO CAPS
100.0000 mg | ORAL_CAPSULE | Freq: Two times a day (BID) | ORAL | Status: DC
  Administered 2022-01-14 – 2022-01-16 (×5): 100 mg via ORAL
  Filled 2022-01-14 (×6): qty 1

## 2022-01-14 MED ORDER — ROCURONIUM BROMIDE 10 MG/ML (PF) SYRINGE
PREFILLED_SYRINGE | INTRAVENOUS | Status: DC | PRN
  Administered 2022-01-14: 50 mg via INTRAVENOUS
  Administered 2022-01-14: 100 mg via INTRAVENOUS
  Administered 2022-01-14 (×3): 50 mg via INTRAVENOUS

## 2022-01-14 MED ORDER — ONDANSETRON HCL 4 MG/2ML IJ SOLN
INTRAMUSCULAR | Status: DC | PRN
  Administered 2022-01-14: 4 mg via INTRAVENOUS

## 2022-01-14 MED ORDER — FENTANYL CITRATE (PF) 250 MCG/5ML IJ SOLN
INTRAMUSCULAR | Status: DC | PRN
  Administered 2022-01-14 (×4): 50 ug via INTRAVENOUS
  Administered 2022-01-14: 100 ug via INTRAVENOUS
  Administered 2022-01-14 (×2): 50 ug via INTRAVENOUS
  Administered 2022-01-14 (×2): 100 ug via INTRAVENOUS
  Administered 2022-01-14 (×6): 50 ug via INTRAVENOUS
  Administered 2022-01-14: 100 ug via INTRAVENOUS

## 2022-01-14 MED ORDER — SODIUM CHLORIDE 0.9 % IV SOLN: 250.0000 mL | INTRAVENOUS | Status: DC

## 2022-01-14 MED ORDER — AMLODIPINE BESYLATE 10 MG PO TABS
10.0000 mg | ORAL_TABLET | Freq: Every day | ORAL | Status: DC
  Administered 2022-01-15 – 2022-01-17 (×3): 10 mg via ORAL
  Filled 2022-01-14 (×3): qty 1

## 2022-01-14 MED ORDER — LIDOCAINE 2% (20 MG/ML) 5 ML SYRINGE
INTRAMUSCULAR | Status: AC
  Filled 2022-01-14: qty 5

## 2022-01-14 MED ORDER — CEFAZOLIN SODIUM-DEXTROSE 2-4 GM/100ML-% IV SOLN
2.0000 g | Freq: Three times a day (TID) | INTRAVENOUS | Status: AC
  Administered 2022-01-14 – 2022-01-15 (×2): 2 g via INTRAVENOUS
  Filled 2022-01-14 (×2): qty 100

## 2022-01-14 MED ORDER — PROPOFOL 10 MG/ML IV BOLUS
INTRAVENOUS | Status: DC | PRN
  Administered 2022-01-14: 150 mg via INTRAVENOUS

## 2022-01-14 MED ORDER — LIDOCAINE-EPINEPHRINE 1 %-1:100000 IJ SOLN
INTRAMUSCULAR | Status: AC
  Filled 2022-01-14: qty 1

## 2022-01-14 MED ORDER — PROPOFOL 1000 MG/100ML IV EMUL
INTRAVENOUS | Status: AC
  Filled 2022-01-14: qty 100

## 2022-01-14 MED ORDER — KETAMINE HCL 50 MG/5ML IJ SOSY
PREFILLED_SYRINGE | INTRAMUSCULAR | Status: AC
  Filled 2022-01-14: qty 5

## 2022-01-14 MED ORDER — METOPROLOL TARTRATE 5 MG/5ML IV SOLN
INTRAVENOUS | Status: DC | PRN
  Administered 2022-01-14: 2.5 mg via INTRAVENOUS
  Administered 2022-01-14: 1 mg via INTRAVENOUS
  Administered 2022-01-14: 1.5 mg via INTRAVENOUS

## 2022-01-14 MED ORDER — ALBUMIN HUMAN 5 % IV SOLN: INTRAVENOUS | Status: DC | PRN

## 2022-01-14 MED ORDER — MENTHOL 3 MG MT LOZG
1.0000 | LOZENGE | OROMUCOSAL | Status: DC | PRN
  Administered 2022-01-15: 3 mg via ORAL
  Filled 2022-01-14: qty 9

## 2022-01-14 MED ORDER — LACTATED RINGERS IV SOLN: INTRAVENOUS | Status: DC

## 2022-01-14 MED ORDER — ESMOLOL HCL 100 MG/10ML IV SOLN
INTRAVENOUS | Status: AC
  Filled 2022-01-14: qty 10

## 2022-01-14 MED ORDER — CEFAZOLIN SODIUM-DEXTROSE 2-4 GM/100ML-% IV SOLN
2.0000 g | INTRAVENOUS | Status: AC
  Administered 2022-01-14 (×2): 2 g via INTRAVENOUS
  Filled 2022-01-14: qty 100

## 2022-01-14 MED ORDER — KETAMINE HCL 10 MG/ML IJ SOLN
INTRAMUSCULAR | Status: DC | PRN
  Administered 2022-01-14: 30 mg via INTRAVENOUS
  Administered 2022-01-14: 20 mg via INTRAVENOUS

## 2022-01-14 MED ORDER — PROMETHAZINE HCL 25 MG/ML IJ SOLN: 6.2500 mg | INTRAMUSCULAR | Status: DC | PRN

## 2022-01-14 MED ORDER — ATORVASTATIN CALCIUM 40 MG PO TABS
40.0000 mg | ORAL_TABLET | Freq: Every day | ORAL | Status: DC
  Administered 2022-01-15 – 2022-01-17 (×3): 40 mg via ORAL
  Filled 2022-01-14 (×3): qty 1

## 2022-01-14 MED ORDER — SCOPOLAMINE 1 MG/3DAYS TD PT72
1.0000 | MEDICATED_PATCH | TRANSDERMAL | Status: DC
  Administered 2022-01-14: 1.5 mg via TRANSDERMAL
  Filled 2022-01-14: qty 1

## 2022-01-14 MED ORDER — LABETALOL HCL 5 MG/ML IV SOLN: 5.0000 mg | Freq: Once | INTRAVENOUS | Status: DC

## 2022-01-14 MED ORDER — ACETAMINOPHEN 325 MG PO TABS
650.0000 mg | ORAL_TABLET | ORAL | Status: DC | PRN
  Administered 2022-01-15 (×3): 650 mg via ORAL
  Filled 2022-01-14 (×3): qty 2

## 2022-01-14 MED ORDER — PHENYLEPHRINE 80 MCG/ML (10ML) SYRINGE FOR IV PUSH (FOR BLOOD PRESSURE SUPPORT)
PREFILLED_SYRINGE | INTRAVENOUS | Status: AC
  Filled 2022-01-14: qty 10

## 2022-01-14 MED ORDER — HYDROMORPHONE HCL 1 MG/ML IJ SOLN
1.0000 mg | INTRAMUSCULAR | Status: DC | PRN
  Administered 2022-01-15: 1 mg via INTRAVENOUS
  Filled 2022-01-14 (×2): qty 1

## 2022-01-14 MED ORDER — ONDANSETRON HCL 4 MG/2ML IJ SOLN: 4.0000 mg | Freq: Four times a day (QID) | INTRAMUSCULAR | Status: DC | PRN

## 2022-01-14 MED ORDER — PHENYLEPHRINE HCL-NACL 20-0.9 MG/250ML-% IV SOLN
INTRAVENOUS | Status: DC | PRN
  Administered 2022-01-14: 25 ug/min via INTRAVENOUS
  Administered 2022-01-14: 40 ug via INTRAVENOUS

## 2022-01-14 MED ORDER — SUCCINYLCHOLINE CHLORIDE 200 MG/10ML IV SOSY
PREFILLED_SYRINGE | INTRAVENOUS | Status: AC
  Filled 2022-01-14: qty 10

## 2022-01-14 MED ORDER — DEXAMETHASONE SODIUM PHOSPHATE 10 MG/ML IJ SOLN
INTRAMUSCULAR | Status: DC | PRN
  Administered 2022-01-14: 10 mg via INTRAVENOUS

## 2022-01-14 MED ORDER — CEFAZOLIN SODIUM 1 G IJ SOLR
INTRAMUSCULAR | Status: AC
  Filled 2022-01-14: qty 20

## 2022-01-14 MED ORDER — ACETAMINOPHEN 10 MG/ML IV SOLN
INTRAVENOUS | Status: AC
  Filled 2022-01-14: qty 100

## 2022-01-14 SURGICAL SUPPLY — 113 items
APPLIER CLIP 11 MED OPEN (CLIP) ×2
BAG COUNTER SPONGE SURGICOUNT (BAG) ×6 IMPLANT
BAND RUBBER #18 3X1/16 STRL (MISCELLANEOUS) ×4 IMPLANT
BASKET BONE COLLECTION (BASKET) ×2 IMPLANT
BLADE BONE MILL MEDIUM (MISCELLANEOUS) IMPLANT
BLADE CLIPPER SURG (BLADE) IMPLANT
BLADE SURG 11 STRL SS (BLADE) ×2 IMPLANT
BUR 14 MATCH 3 (BUR) IMPLANT
BUR BARREL STRAIGHT FLUTE 4.0 (BURR) IMPLANT
BUR CARBIDE MATCH 3.0 (BURR) IMPLANT
BUR MATCHSTICK NEURO 3.0 LAGG (BURR) IMPLANT
BUR MR8 14CM BALL SYMTRI 5 (BUR) IMPLANT
BUR SURG IBUR 4X12.5 (BURR) ×2 IMPLANT
BURR 14 MATCH 3 (BUR)
BURR MR8 14CM BALL SYMTRI 5 (BUR) ×2
BURR SURG IBUR 4X12.5 (BURR)
CAGE EXP CATALYFT SHORT 9X22.5 (Cage) IMPLANT
CANISTER SUCT 3000ML PPV (MISCELLANEOUS) ×2 IMPLANT
CLIP APPLIE 11 MED OPEN (CLIP) ×2 IMPLANT
CLIP LIGATING EXTRA MED SLVR (CLIP) IMPLANT
CNTNR URN SCR LID CUP LEK RST (MISCELLANEOUS) ×2 IMPLANT
CONT SPEC 4OZ STRL OR WHT (MISCELLANEOUS)
COVER BACK TABLE 60X90IN (DRAPES) ×2 IMPLANT
COVERAGE SUPPORT O-ARM STEALTH (MISCELLANEOUS) IMPLANT
DERMABOND ADVANCED .7 DNX12 (GAUZE/BANDAGES/DRESSINGS) ×2 IMPLANT
DILATOR NEUROSTIM ECLP 2.5-13 (INSTRUMENTS) IMPLANT
DISSECTOR BLUNT TIP ENDO 5MM (MISCELLANEOUS) IMPLANT
DRAPE C-ARM 42X72 X-RAY (DRAPES) ×8 IMPLANT
DRAPE C-ARMOR (DRAPES) ×2 IMPLANT
DRAPE LAPAROTOMY 100X72X124 (DRAPES) ×4 IMPLANT
DRAPE MICROSCOPE SLANT 54X150 (MISCELLANEOUS) ×2 IMPLANT
DRAPE SHEET LG 3/4 BI-LAMINATE (DRAPES) ×8 IMPLANT
DRAPE SURG 17X23 STRL (DRAPES) ×4 IMPLANT
DURAPREP 26ML APPLICATOR (WOUND CARE) ×2 IMPLANT
ELECT BLADE 4.0 EZ CLEAN MEGAD (MISCELLANEOUS)
ELECT BLADE 6.5 EXT (BLADE) ×2 IMPLANT
ELECT REM PT RETURN 9FT ADLT (ELECTROSURGICAL) ×4
ELECTRODE BLDE 4.0 EZ CLN MEGD (MISCELLANEOUS) ×4 IMPLANT
ELECTRODE REM PT RTRN 9FT ADLT (ELECTROSURGICAL) ×4 IMPLANT
FEE COVERAGE SUPPORT O-ARM (MISCELLANEOUS) ×2 IMPLANT
GAUZE 4X4 16PLY ~~LOC~~+RFID DBL (SPONGE) IMPLANT
GAUZE SPONGE 4X4 12PLY STRL (GAUZE/BANDAGES/DRESSINGS) ×4 IMPLANT
GLOVE BIO SURGEON STRL SZ7.5 (GLOVE) ×2 IMPLANT
GLOVE BIOGEL PI IND STRL 7.0 (GLOVE) IMPLANT
GLOVE BIOGEL PI IND STRL 7.5 (GLOVE) ×2 IMPLANT
GLOVE BIOGEL PI IND STRL 8 (GLOVE) ×2 IMPLANT
GLOVE ECLIPSE 7.5 STRL STRAW (GLOVE) ×4 IMPLANT
GLOVE SURG SS PI 7.5 STRL IVOR (GLOVE) IMPLANT
GOWN STRL REUS W/ TWL LRG LVL3 (GOWN DISPOSABLE) ×4 IMPLANT
GOWN STRL REUS W/ TWL XL LVL3 (GOWN DISPOSABLE) ×2 IMPLANT
GOWN STRL REUS W/TWL 2XL LVL3 (GOWN DISPOSABLE) IMPLANT
GOWN STRL REUS W/TWL LRG LVL3 (GOWN DISPOSABLE) ×6
GOWN STRL REUS W/TWL XL LVL3 (GOWN DISPOSABLE) ×12
HEMOSTAT POWDER KIT SURGIFOAM (HEMOSTASIS) ×2 IMPLANT
INSERT FOGARTY 61MM (MISCELLANEOUS) IMPLANT
INSERT FOGARTY SM (MISCELLANEOUS) IMPLANT
KIT BASIN OR (CUSTOM PROCEDURE TRAY) ×4 IMPLANT
KIT INFUSE SMALL (Orthopedic Implant) IMPLANT
KIT POSITION SURG JACKSON T1 (MISCELLANEOUS) ×2 IMPLANT
KIT TURNOVER KIT B (KITS) ×2 IMPLANT
LIGHT SOURCE STRAIGHT TIP (INSTRUMENTS) IMPLANT
MARKER SPHERE PSV REFLC NDI (MISCELLANEOUS) ×10 IMPLANT
NDL HYPO 18GX1.5 BLUNT FILL (NEEDLE) IMPLANT
NDL SPNL 18GX3.5 QUINCKE PK (NEEDLE) ×2 IMPLANT
NEEDLE HYPO 18GX1.5 BLUNT FILL (NEEDLE) IMPLANT
NEEDLE HYPO 22GX1.5 SAFETY (NEEDLE) ×2 IMPLANT
NEEDLE SPNL 18GX3.5 QUINCKE PK (NEEDLE) ×2 IMPLANT
NS IRRIG 1000ML POUR BTL (IV SOLUTION) ×4 IMPLANT
PACK LAMINECTOMY NEURO (CUSTOM PROCEDURE TRAY) ×4 IMPLANT
PAD ARMBOARD 7.5X6 YLW CONV (MISCELLANEOUS) ×8 IMPLANT
PUTTY DBF 6CC CORTICAL FIBERS (Putty) IMPLANT
ROD CVD SOLERA 5.5X90 (Rod) IMPLANT
ROD SOLERA 80MM (Rod) ×2 IMPLANT
ROD SOLERA 80X5.5XNS TI (Rod) IMPLANT
SCREW MA ANTERALIGN 5X20 (Screw) IMPLANT
SCREW SET SOLERA (Screw) ×16 IMPLANT
SCREW SET SOLERA TI5.5 (Screw) IMPLANT
SCREW SOLERA 45X6.5XMA (Screw) IMPLANT
SCREW SOLERA 5.5/6.0 7.5X40 (Screw) IMPLANT
SCREW SOLERA 6.5X45MM (Screw) ×12 IMPLANT
SPACER MINIPLATE 12X20 2.5 (Spacer) IMPLANT
SPACER NANO 20X12X50 9.9 6D (Spacer) IMPLANT
SPACER TLIF NANO 20X10X50 6D (Spacer) IMPLANT
SPIKE FLUID TRANSFER (MISCELLANEOUS) ×2 IMPLANT
SPONGE INTESTINAL PEANUT (DISPOSABLE) ×4 IMPLANT
SPONGE SURGIFOAM ABS GEL 100 (HEMOSTASIS) IMPLANT
SPONGE T-LAP 18X18 ~~LOC~~+RFID (SPONGE) ×4 IMPLANT
SPONGE T-LAP 4X18 ~~LOC~~+RFID (SPONGE) IMPLANT
SUT MNCRL AB 3-0 PS2 18 (SUTURE) ×2 IMPLANT
SUT PDS AB 1 CTX 36 (SUTURE) IMPLANT
SUT PROLENE 4 0 RB 1 (SUTURE)
SUT PROLENE 4-0 RB1 .5 CRCL 36 (SUTURE) IMPLANT
SUT PROLENE 5 0 CC1 (SUTURE) IMPLANT
SUT PROLENE 6 0 C 1 30 (SUTURE) IMPLANT
SUT PROLENE 6 0 CC (SUTURE) IMPLANT
SUT SILK 0 TIES 10X30 (SUTURE) IMPLANT
SUT SILK 2 0 TIES 10X30 (SUTURE) ×2 IMPLANT
SUT SILK 2 0SH CR/8 30 (SUTURE) IMPLANT
SUT SILK 3 0 TIES 17X18 (SUTURE)
SUT SILK 3 0SH CR/8 30 (SUTURE) IMPLANT
SUT SILK 3-0 18XBRD TIE BLK (SUTURE) IMPLANT
SUT VIC AB 0 CT1 18XCR BRD8 (SUTURE) ×2 IMPLANT
SUT VIC AB 0 CT1 27 (SUTURE)
SUT VIC AB 0 CT1 27XBRD ANBCTR (SUTURE) ×4 IMPLANT
SUT VIC AB 0 CT1 8-18 (SUTURE) ×4
SUT VIC AB 2-0 CP2 18 (SUTURE) ×4 IMPLANT
SUT VIC AB 3-0 SH 8-18 (SUTURE) ×2 IMPLANT
SUT VICRYL 4-0 PS2 18IN ABS (SUTURE) ×2 IMPLANT
SYR 3ML LL SCALE MARK (SYRINGE) IMPLANT
TOWEL GREEN STERILE (TOWEL DISPOSABLE) ×6 IMPLANT
TOWEL GREEN STERILE FF (TOWEL DISPOSABLE) ×8 IMPLANT
TRAY FOLEY MTR SLVR 16FR STAT (SET/KITS/TRAYS/PACK) ×2 IMPLANT
WATER STERILE IRR 1000ML POUR (IV SOLUTION) ×4 IMPLANT

## 2022-01-14 NOTE — Anesthesia Procedure Notes (Addendum)
Procedure Name: Intubation Date/Time: 01/14/2022 8:45 AM  Performed by: Valda Favia, CRNAPre-anesthesia Checklist: Patient identified, Emergency Drugs available, Suction available and Patient being monitored Patient Re-evaluated:Patient Re-evaluated prior to induction Oxygen Delivery Method: Circle system utilized Preoxygenation: Pre-oxygenation with 100% oxygen Induction Type: IV induction Ventilation: Two handed mask ventilation required and Oral airway inserted - appropriate to patient size Laryngoscope Size: Mac and 4 Grade View: Grade II Tube type: Oral Tube size: 7.5 mm Number of attempts: 2 Airway Equipment and Method: Stylet and Oral airway Placement Confirmation: ETT inserted through vocal cords under direct vision, positive ETCO2 and breath sounds checked- equal and bilateral Secured at: 24 cm Tube secured with: Tape Dental Injury: Teeth and Oropharynx as per pre-operative assessment  Comments: DLx1 by Lorrin Jackson unable to pass ETT, repositioned and DL x2 by SRNA successful ETT placement as above

## 2022-01-14 NOTE — Plan of Care (Signed)

## 2022-01-14 NOTE — H&P (Signed)
History and Physical Interval Note:  01/14/2022 8:15 AM  Jeffrey [name]  has presented today for surgery, with the diagnosis of Scoliosis of lumbar spine.  The various methods of treatment have been discussed with the patient and family. After consideration of risks, benefits and other options for treatment, the patient has consented to  Procedure(s): L34 L45 OLIF/L5S1 TLIF/L34 L45 L5S1 Laminectomies and posterolateral fusion (Left) MINIMALLY INVASIVE (MIS) TRANSFORAMINAL LUMBAR INTERBODY FUSION (TLIF) 1 LEVEL (N/A) Application of O-Arm (N/A) ABDOMINAL EXPOSURE (N/A) as a surgical intervention.  The patient's history has been reviewed, patient examined, no change in status, stable for surgery.  I have reviewed the patient's chart and labs.  Questions were answered to the patient's satisfaction.    Abdominal exposure for L3-L4 and L4-L5 OLIF  Marty Heck        [name]         MRN: [id number]        DOB: [birth year]          Sex: male   REASON FOR CONSULT: Evaluate for abdominal exposure for L3-L5 OLIF   HPI: Jeffrey [name] is a 56 y.o. male, history of hypertension, hyperlipidemia, DVT/PE in 2015 that presents for abdominal exposure for L3-L5 OLIF.  Patient describes chronic lower back pain for years.  He has failed conservative management.  He is followed by Dr. Zada Finders with neurosurgery.  In review of the images he has evidence of scoliosis and Dr. Zada Finders has recommended an L3-L4 and L4-L5 OLIF with a TLIF at L5-S1.  Patient's previous abdominal surgery includes umbilical hernia repair that was done open with mesh.       Past Medical History:  Diagnosis Date   Acute pulmonary embolus ([place])      bilateral submassive PE in setting of missing several doses of Xarelto for his DVT   Arthritis     DVT (deep venous thrombosis) ([name]) 11/28/2013    RT LEG   High cholesterol     Hypertension     Lumbar spine scoliosis     Marijuana use              Past Surgical History:  Procedure Laterality Date   HERNIA REPAIR        6384 and 6659; umbilical hernia repair   KNEE SURGERY Bilateral      Left knee 1993, right knee 2004   TONSILLECTOMY       TOTAL KNEE ARTHROPLASTY Left 02/10/2017    Procedure: LEFT TOTAL KNEE ARTHROPLASTY;  Surgeon: Leandrew Koyanagi, MD;  Location: [place] [facility];  Service: Orthopedics;  Laterality: Left;   TOTAL KNEE ARTHROPLASTY Right 07/28/2017    Procedure: RIGHT TOTAL KNEE ARTHROPLASTY;  Surgeon: Leandrew Koyanagi, MD;  Location: [place] [facility];  Service: Orthopedics;  Laterality: Right;           Family History  Problem Relation Age of Onset   Heart disease Other     Arthritis Other     Alcohol abuse Mother     Heart disease Mother     Depression Mother     Hypertension Mother     Kidney disease Mother     Arthritis Mother     Clotting disorder Mother     Arthritis Father     Alcohol abuse Brother     Drug abuse Brother     Sickle cell anemia Brother     Arthritis Maternal Grandmother     Heart disease Maternal Grandmother  Arthritis Maternal Grandfather     Heart disease Maternal Grandfather     Asthma Cousin        SOCIAL HISTORY: Social History         Socioeconomic History   Marital status: Married      Spouse name: Not on file   Number of children: Not on file   Years of education: Not on file   Highest education level: Not on file  Occupational History   Not on file  Tobacco Use   Smoking status: Every Day      Packs/day: 1.00      Years: 35.00      Total pack years: 35.00      Types: Cigarettes      Start date: 10/07/1976   Smokeless tobacco: Never   Tobacco comments:      Stopped for up to 5 years total - Peak rate 2ppd  Vaping Use   Vaping Use: Former  Substance and Sexual Activity   Alcohol use: Yes      Alcohol/week: 12.0 standard drinks of alcohol      Types: 12 Cans of beer per week      Comment: seldom wine/liquor   Drug use: Yes      Types: Marijuana      Comment: Cocaine last  used in 2004, current marijuana use   Sexual activity: Yes      Birth control/protection: None      Comment: With wife  Other Topics Concern   Not on file  Social History Narrative    Lives in [place].    Chickens as pet.     Hobbies: Art therapist Pulmonary (11/24/16):    Originally from [place]. Has worked in Architect, Ambulance person, Press photographer, & in a warehouse. No known asbestos exposure. Previously had chickens as pets. No mold exposure.     Social Determinants of Health    Financial Resource Strain: Not on file  Food Insecurity: Not on file  Transportation Needs: Not on file  Physical Activity: Not on file  Stress: Not on file  Social Connections: Not on file  Intimate Partner Violence: Not on file      No Known Allergies         Current Outpatient Medications  Medication Sig Dispense Refill   amLODipine (NORVASC) 10 MG tablet Take 1 tablet (10 mg total) by mouth daily. Please make an appointment for your yearly check up. 30 tablet 1   apixaban (ELIQUIS) 5 MG TABS tablet Take 1 tablet (5 mg total) by mouth 2 (two) times daily. 60 tablet 5   atorvastatin (LIPITOR) 40 MG tablet TAKE 1 TABLET BY MOUTH ONCE DAILY 90 tablet 2   lisinopril (ZESTRIL) 20 MG tablet Take 1 tablet (20 mg total) by mouth at bedtime. 90 tablet 3   traMADol (ULTRAM) 50 MG tablet Take 1-2 tablets by mouth three times daily as needed. 60 tablet 2   traZODone (DESYREL) 50 MG tablet Take 1 tablet  by mouth at bedtime as needed for sleep. 30 tablet 5    No current facility-administered medications for this visit.      REVIEW OF SYSTEMS:  '[X]'$  denotes positive finding, '[ ]'$  denotes negative finding Cardiac   Comments:  Chest pain or chest pressure:      Shortness of breath upon exertion:      Short of breath when lying flat:      Irregular heart rhythm:  Vascular      Pain in calf, thigh, or hip brought on by ambulation:      Pain in feet at night that wakes you up from your  sleep:       Blood clot in your veins:      Leg swelling:              Pulmonary      Oxygen at home:      Productive cough:       Wheezing:              Neurologic      Sudden weakness in arms or legs:       Sudden numbness in arms or legs:       Sudden onset of difficulty speaking or slurred speech:      Temporary loss of vision in one eye:       Problems with dizziness:              Gastrointestinal      Blood in stool:       Vomited blood:              Genitourinary      Burning when urinating:       Blood in urine:             Psychiatric      Major depression:              Hematologic      Bleeding problems:      Problems with blood clotting too easily:             Skin      Rashes or ulcers:             Constitutional      Fever or chills:          PHYSICAL EXAM:    Vitals:    12/16/21 1103  BP: (!) 137/92  Pulse: 73  Resp: 18  Temp: 97.8 F (36.6 C)  TempSrc: Temporal  SpO2: 97%  Weight: 251 lb (113.9 kg)  Height: 6' 3.5" (1.918 m)      GENERAL: The patient is a well-nourished male, in no acute distress. The vital signs are documented above. CARDIAC: There is a regular rate and rhythm.  VASCULAR:  Palpable femoral pulses bilaterally Palpable DP pulses bilaterally PULMONARY: No respiratory distress. ABDOMEN: Soft and non-tender.  Previous incision from umbilical hernia repair. MUSCULOSKELETAL: There are no major deformities or cyanosis. NEUROLOGIC: No focal weakness or paresthesias are detected. SKIN: There are no ulcers or rashes noted. PSYCHIATRIC: The patient has a normal affect.   DATA:         Assessment/Plan:   56 year old male presents for evaluation of abdominal exposure for L3-L4 and L4-L5 OLIF in the setting of chronic lower back pain.  I have reviewed his recent MRI imaging as noted above and I think he would be a good candidate for OLIF.  Discussed him being in the right lateral position.  Discussed making an incision over the  left oblique muscles at L3-L4 and L4-L5 disc space near the iliac crest.  Discussed entering the retroperitoneum and mobilized peritoneum and left ureter to the midline and mobilizing left psoas muscle lateral.  Discussed this sometimes requires mobilization of the iliac vessels as well.  Discussed risk of injury to above structures.  Questions answered.  Look forward to assisting Dr. Zada Finders.     Marty Heck,  MD [facility] Office: [phone]

## 2022-01-14 NOTE — Op Note (Signed)
PATIENT: Jeffrey [name]  DAY OF SURGERY: 01/14/22   PRE-OPERATIVE DIAGNOSIS:  Lumbar scoliosis with positive sagittal balance   POST-OPERATIVE DIAGNOSIS:  Same   PROCEDURE:  L3-4, L4-5 oblique lumbar interbody fusion; L5-S1 open transforaminal lumbar interbody fusion, L3-4, L4-5, L5-S1 smith-peterson osteotomies, L3-S1 posterolateral instrumented fusion   SURGEON:  Surgeon(s) and Role:    Elmira Olkowski, Joyice Faster, MD - Co-surgeon    Monica Martinez, MD - Co-surgeon    Ashok Pall, MD - Assistant   ANESTHESIA: ETGA   BRIEF HISTORY: This is a 56 year old man who presented with severe back pain with severely stooped posture, workup showed positive sagittal balance with a LL-PI mismatch of 32 degrees and a increased pelvic tilt. We discussed options including correction of his deformity including risks, benefits, and alternatives and he wished to proceed with surgical correction.   OPERATIVE DETAIL: The patient was taken to the operating room and anesthesia was induced by the anesthesia team. They were placed on the OR table in the lateral decubitus position with the left side up then pressure points were padded and the patient was secured to the table. A formal time out was performed with two patient identifiers and confirmed the operative site.   Dr. Carlis Abbott performed the retroperitoneal approach with me assisting. After confirming the correct level and placing the retractor system, an annulotomy was performed at L4-5 followed by a discectomy in the usual fashion. During the procedure, fluoroscopic shots were taken to localize instruments throughout. Following a large discectomy, an interbody was sized, packed with rBMP and allograft, then placed with good positioning. Given the planned change in position for the posterior portion of the procedure, I placed an interbody with a lateral plate and screw to secure it during the repositioning. This technique was then repeated at the L3-4 level with  good positioning on fluoro. However, given the quite large osteophyte present at L2-3 and smaller ones at L3-4, I did not use a plate at U9-8 as this would be technically quite difficult.  The wound was irrigated and the retractor was then removed with good hemostasis. The fascia was closed with PDS followed by superficial layers with dermabond. The drapes were removed, final counts were correct, and a final xray was taken to confirm correct counts. After the xray had been reviewed, the patient was repositioned prone on a Jackson table.  The patient's lumbar spine was prepped and draped in a sterile fashion and a midline incision was created. Subperiosteal dissection was performed bilaterally to expose L3 to S1 out to the transverse processes.   A spinous process clamp was applied and secured, followed by attachment of a reference array. The field was draped and the O-arm was brought into the field. An intra-op CT was performed, sent to the Stealth navigation station, registered to the patient's anatomy, and confirmed with landmarks with acceptable fit. Stereotactic spinal navigation was utilized throughout the procedure for planning and placement of pedicle screw trajectories.  Instrumentation was then performed. Fluoroscopy was used to guide placement of bilateral pedicle screws (Medtronic) at L3, L4, L5, and S1. These were placed with navigated instruments by localizing the pedicle, drilling a pilot hole, cannulating the pedicle with an awl-tap, palpating for pedicle wall breaches, and then placing the screw.   Decompression was performed, which consisted of laminectomies at L3-4, L4-5, and L5-S1, which were performed with a high speed drill and rongeurs. Facetectomies were then performed bilaterally at L3-4/L4-5/L5-S1 to allow for further lordosis.   Rods  were sized and introduced bilaterally, then reduced into the rod to provide some additional lordosis. These were then final tightened according to  manufacturer torque specifications. The bone was thoroughly decorticated over the fusion surface and the previously resected bone fragments were morselized and used as autograft.  All instrument and sponge counts were correct, the incision was then closed in layers. The patient was then returned to anesthesia for emergence. No apparent complications at the completion of the procedure.   EBL:  285m net EBL after blood was returned from cell saver   DRAINS: none   SPECIMENS: none   TJudith Part MD 01/14/22 8:50 AM

## 2022-01-14 NOTE — Anesthesia Procedure Notes (Signed)
Central Venous Catheter Insertion Performed by: Duane Boston, MD, anesthesiologist Start/End10/18/2023 8:08 AM, 01/14/2022 8:18 AM Patient location: Pre-op. Preanesthetic checklist: patient identified, IV checked, site marked, risks and benefits discussed, surgical consent, monitors and equipment checked, pre-op evaluation, timeout performed and anesthesia consent Position: Trendelenburg Lidocaine 1% used for infiltration and patient sedated Hand hygiene performed , maximum sterile barriers used  and Seldinger technique used Catheter size: 8 Fr Total catheter length 16. Central line was placed.Double lumen Procedure performed using ultrasound guided technique. Ultrasound Notes:anatomy identified, needle tip was noted to be adjacent to the nerve/plexus identified, no ultrasound evidence of intravascular and/or intraneural injection and image(s) printed for medical record Attempts: 1 Following insertion, dressing applied, line sutured and Biopatch. Post procedure assessment: blood return through all ports, free fluid flow and no air  Patient tolerated the procedure well with no immediate complications.

## 2022-01-14 NOTE — Anesthesia Postprocedure Evaluation (Signed)
Anesthesia Post Note  Patient: Jeffrey [name]  Procedure(s) Performed: Lumbar three-four Lumbar four-five Oblique Lumbar Interbody Fusion (Left) OPEN TRANSFORAMINAL LUMBAR INTERBODY FUSION  LUMBAR FIVE-SACRAL ONE ,LAMINECTOMIES AND POSTERIOR LATERAL INSTRUMENTATION FUSION  LUMBAR THREE TO FIVE SACRAL ONE Application of O-Arm ABDOMINAL EXPOSURE     Patient location during evaluation: PACU Anesthesia Type: General Level of consciousness: sedated Pain management: pain level controlled Vital Signs Assessment: post-procedure vital signs reviewed and stable Respiratory status: spontaneous breathing and respiratory function stable Cardiovascular status: stable Postop Assessment: no apparent nausea or vomiting Anesthetic complications: no   No notable events documented.  Last Vitals:  Vitals:   01/14/22 1745 01/14/22 1800  BP: (!) 157/105 (!) 151/98  Pulse: 84 80  Resp: 14 14  Temp:    SpO2: 100% 96%    Last Pain:  Vitals:   01/14/22 1745  TempSrc:   PainSc: 9                  [name] DANIEL

## 2022-01-14 NOTE — Anesthesia Procedure Notes (Addendum)
Arterial Line Insertion Start/End10/18/2023 8:05 AM Performed by: Valda Favia, CRNA, CRNA  Patient location: Pre-op. Preanesthetic checklist: patient identified, IV checked, site marked, risks and benefits discussed, surgical consent, monitors and equipment checked, pre-op evaluation, timeout performed and anesthesia consent Lidocaine 1% used for infiltration Right, radial was placed Catheter size: 20 G Hand hygiene performed  and maximum sterile barriers used   Attempts: 2 (1 attempt by [facility]) Procedure performed without using ultrasound guided technique. Following insertion, dressing applied. Post procedure assessment: normal and unchanged  Patient tolerated the procedure well with no immediate complications.

## 2022-01-14 NOTE — Progress Notes (Signed)
Pt refused to wear CPAP, he stated he has never wore a CPAP before and does not want one now.

## 2022-01-14 NOTE — Op Note (Signed)
Date: January 14, 2022  Preoperative diagnosis: Chronic lower back pain  Postoperative diagnosis: Same  Procedure: 1.  Oblique retroperitoneal spine exposure at the L3-L4 disc space for L3-L4 OLIF 2.  Oblique retroperitoneal spine exposure at the L4-L5 disc space for L4-L5 OLIF  Surgeon: Dr. Marty Heck, MD  Co-surgeon: Dr. Emelda Brothers, MD  Indications: 56 year old male with chronic severe lower back pain that has been evaluated by Dr. Zada Finders.  He ultimately recommended a multilevel approach including L3-L4 and L4-L5 OLIF and asked vascular surgery was assist to assist with spine exposure.  He presents today after risks benefits discussed.  Findings: Patient was placed in the right lateral position and all pressure points padded.  A fluoroscopic C-arm was used to mark the L3-L4 and L4-L5 disc space over the left oblique muscles approximately two fingerbreadths off the left iliac crest.  Muscle-sparing technique was then used to enter the retroperitoneum and mobilized the left ureter and peritoneum out of the retroperitoneum.  The left psoas muscle was mobilized lateral to the disc spaces.  The L3-L4 and L4-L5 disc space were then fully exposed and retractors were placed at each level and confirmed on lateral fluoroscopy.  Initially placed retractor at L4-L5 and then moved the retractor to L3-L4.  Anesthesia: General  Details: Patient was taken to the operating room after informed consent was obtained.  Placed on the operative table in supine position.  General endotracheal anesthesia was induced.  Patient was then rolled in the right lateral position and all pressure points padded.  Fluoroscopic C-arm was then used in lateral position to mark the L3-L4 and L4-5 disc space.  We then planned our incision two fingerbreadths off the left iliac crest.  Abdominal wall and left flank were then prepped and draped in standard sterile fashion.  Antibiotics were given and timeout performed.   Initially made our incision over the left oblique muscles where we had planned the incision.  Subcutaneous tissue was opened with Bovie cautery and then opened the external oblique aponeurosis with Bovie cautery.  Cerebellar retractors were used and a muscle-sparing technique was used to dissect between the layers of the oblique muscles and then we entered the retroperitoneum after mobilizing the peritoneum and left ureter down with blunt dissection.  The left psoas muscle was visualized.  I used hand-held Wiley retractors to do then mobilize the left psoas lateral to the iliac vessels and lateral to the disc space.  We initially went to the L4-L5 disc space I used KD to dissect the left psoas lateral to the disc space.  A Medtronic OLIF retractor was placed into the L4-L5 disc space and this was then adjusted with full exposure of the disc space from an oblique approach.  We confirmed under lateral fluoroscopy we were at the correct level.  Case was turned over to Dr. Zada Finders.  I was called back to the room once his implant was complete at L4-L5.  I then mobilized up one level and continued to mobilize the left psoas lateral to the disc space and identified the L3-L4 disc space.  The Medtronic OLIF retractor was then moved up one level and repositioned and again confirmed that we had good exposure of the disc space and confirmed we were at the correct level on lateral fluoroscopy at L3-L4.  Case was turned over Dr. Zada Finders.  Complications: None  Condition: Stable  Marty Heck, MD [facility] Office: [place]

## 2022-01-14 NOTE — H&P (Signed)
Surgical H&P Update  HPI: 56 y.o. with a history of severe low back pain with sagittal imbalance. No changes in health since they were last seen. Still having severe low back pain and wishes to proceed with surgery.  PMHx:  Past Medical History:  Diagnosis Date   Acute pulmonary embolus ([place])    bilateral submassive PE in setting of missing several doses of Xarelto for his DVT   Arthritis    DVT (deep venous thrombosis) ([place]) 11/28/2013   RT LEG   High cholesterol    Hypertension    Lumbar spine scoliosis    Marijuana use    Sleep apnea    FamHx:  Family History  Problem Relation Age of Onset   Heart disease Other    Arthritis Other    Alcohol abuse Mother    Heart disease Mother    Depression Mother    Hypertension Mother    Kidney disease Mother    Arthritis Mother    Clotting disorder Mother    Arthritis Father    Alcohol abuse Brother    Drug abuse Brother    Sickle cell anemia Brother    Arthritis Maternal Grandmother    Heart disease Maternal Grandmother    Arthritis Maternal Grandfather    Heart disease Maternal Grandfather    Asthma Cousin    SocHx:  reports that he has been smoking cigars. He has never used smokeless tobacco. He reports current alcohol use of about 12.0 standard drinks of alcohol per week. He reports current drug use. Drug: Marijuana.  Physical Exam: Strength 5/5 x4 and SILTx4   Assesment/Plan: 56 y.o. man with history of severe low back pain 2/2 sagittal imbalance, here for L3-4/4-5 OLIF and L5-S1 TLIF / L3-S1 PLF. Risks, benefits, and alternatives discussed and the patient would like to continue with surgery.  -OR today -4NP post-op  Judith Part, MD 01/14/22 8:13 AM

## 2022-01-14 NOTE — Transfer of Care (Signed)
Immediate Anesthesia Transfer of Care Note  Patient: Jeffrey [name]  Procedure(s) Performed: Lumbar three-four Lumbar four-five Oblique Lumbar Interbody Fusion (Left) OPEN TRANSFORAMINAL LUMBAR INTERBODY FUSION  LUMBAR FIVE-SACRAL ONE ,LAMINECTOMIES AND POSTERIOR LATERAL INSTRUMENTATION FUSION  LUMBAR THREE TO FIVE SACRAL ONE Application of O-Arm ABDOMINAL EXPOSURE  Patient Location: PACU  Anesthesia Type:General  Level of Consciousness: drowsy  Airway & Oxygen Therapy: Patient Spontanous Breathing and Patient connected to face mask oxygen  Post-op Assessment: Report given to RN and Post -op Vital signs reviewed and stable  Post vital signs: Reviewed and stable  Last Vitals:  Vitals Value Taken Time  BP 159/101 01/14/22 1652  Temp    Pulse 93 01/14/22 1653  Resp 25 01/14/22 1653  SpO2 100 % 01/14/22 1653  Vitals shown include unvalidated device data.  Last Pain:  Vitals:   01/14/22 0712  TempSrc:   PainSc: 7          Complications: No notable events documented.

## 2022-01-15 ENCOUNTER — Inpatient Hospital Stay (HOSPITAL_COMMUNITY): Payer: 59

## 2022-01-15 MED ORDER — CHLORHEXIDINE GLUCONATE CLOTH 2 % EX PADS
6.0000 | MEDICATED_PAD | Freq: Every day | CUTANEOUS | Status: DC
  Administered 2022-01-15 – 2022-01-17 (×3): 6 via TOPICAL

## 2022-01-15 MED ORDER — GABAPENTIN 300 MG PO CAPS
300.0000 mg | ORAL_CAPSULE | Freq: Three times a day (TID) | ORAL | Status: DC
  Administered 2022-01-15 – 2022-01-17 (×6): 300 mg via ORAL
  Filled 2022-01-15 (×6): qty 1

## 2022-01-15 MED ORDER — HEPARIN SODIUM (PORCINE) 5000 UNIT/ML IJ SOLN
5000.0000 [IU] | Freq: Three times a day (TID) | INTRAMUSCULAR | Status: DC
  Administered 2022-01-15 – 2022-01-16 (×4): 5000 [IU] via SUBCUTANEOUS
  Filled 2022-01-15 (×4): qty 1

## 2022-01-15 MED ORDER — ORAL CARE MOUTH RINSE: 15.0000 mL | OROMUCOSAL | Status: DC | PRN

## 2022-01-15 NOTE — Evaluation (Signed)
Physical Therapy Evaluation Patient Details Name: Jeffrey [name] MRN: [id number] DOB: 05/29/1965 Today's Date: 01/15/2022  History of Present Illness  The pt is a 56 yo male presenting 10/18 for L3-4 and L4-5 oblique lumbar interbody fusion. PMH includes: HTN, HLD, PE, arthritis, bilateral knee surgery, and marijuana use.   Clinical Impression  Pt in bed upon arrival of PT, agreeable to evaluation at this time. Prior to admission the pt was ambulating with use of cane for mobility, reports independence with ADLs and IADLs around house despite sedentary lifestyle. The pt now presents with limitations in functional mobility, strength, power, dynamic stability and activity tolerance due to above dx, and will continue to benefit from skilled PT to address these deficits. The pt required significant assist to complete bed mobility with use of log roll technique, but was able to complete sit-stand transfers and short bout of ambulation in the room with use of RW and minG for safety. He was limited at this time due to fatigue and pain, but is agreeable to mobility progression and stair training prior to anticipated return home.         Recommendations for follow up therapy are one component of a multi-disciplinary discharge planning process, led by the attending physician.  Recommendations may be updated based on patient status, additional functional criteria and insurance authorization.  Follow Up Recommendations Outpatient PT      Assistance Recommended at Discharge PRN  Patient can return home with the following  A little help with walking and/or transfers;Assistance with cooking/housework;Assist for transportation;Help with stairs or ramp for entrance    Equipment Recommendations Rolling walker (2 wheels)  Recommendations for Other Services       Functional Status Assessment Patient has had a recent decline in their functional status and demonstrates the ability to make significant  improvements in function in a reasonable and predictable amount of time.     Precautions / Restrictions Precautions Precautions: Fall;Back Precaution Booklet Issued: Yes (comment) Required Braces or Orthoses:  (no brace needed) Restrictions Weight Bearing Restrictions: No      Mobility  Bed Mobility Overal bed mobility: Needs Assistance Bed Mobility: Rolling, Sidelying to Sit Rolling: Mod assist Sidelying to sit: Mod assist       General bed mobility comments: modA with poor ability to follow log roll instructions without repeated cues and assist.    Transfers Overall transfer level: Needs assistance Equipment used: Rolling walker (2 wheels) Transfers: Sit to/from Stand Sit to Stand: Min guard           General transfer comment: heavy reliance on UEs, cues for hand placement. slow to rise    Ambulation/Gait Ambulation/Gait assistance: Min guard Gait Distance (Feet): 8 Feet Assistive device: Rolling walker (2 wheels) Gait Pattern/deviations: Step-to pattern, Decreased stride length, Trunk flexed Gait velocity: decreased Gait velocity interpretation: <1.31 ft/sec, indicative of household ambulator   General Gait Details: small steps with trunk flexed and heavy reliance on BUE support. no overt LOB     Balance Overall balance assessment: Needs assistance   Sitting balance-Leahy Scale: Good       Standing balance-Leahy Scale: Poor Standing balance comment: can release walker in static standing                             Pertinent Vitals/Pain Pain Assessment Pain Assessment: 0-10 Pain Score: 9  Faces Pain Scale: Hurts even more Pain Location: all at incision Pain Descriptors / Indicators:  Discomfort, Grimacing Pain Intervention(s): Limited activity within patient's tolerance, Monitored during session, Repositioned    Home Living Family/patient expects to be discharged to:: Private residence Living Arrangements: Spouse/significant  other;Children Available Help at Discharge: Family;Available 24 hours/day Type of Home: House Home Access: Stairs to enter Entrance Stairs-Rails: Right;Left;Can reach both Entrance Stairs-Number of Steps: 4   Home Layout: One level Home Equipment: Cane - quad;Hand held shower head;Shower seat - built in      Prior Function Prior Level of Function : Needs assist;Working/employed;Driving             Mobility Comments: uses cane due to pain ADLs Comments: needs assist with socks     Hand Dominance   Dominant Hand: Right    Extremity/Trunk Assessment   Upper Extremity Assessment Upper Extremity Assessment: Defer to OT evaluation    Lower Extremity Assessment Lower Extremity Assessment: Generalized weakness (poor strength to power up to standing, no change in sensation)    Cervical / Trunk Assessment Cervical / Trunk Assessment: Back Surgery  Communication   Communication: No difficulties  Cognition Arousal/Alertness: Awake/alert Behavior During Therapy: WFL for tasks assessed/performed Overall Cognitive Status: Within Functional Limits for tasks assessed                                          General Comments General comments (skin integrity, edema, etc.): VSS on RA    Exercises     Assessment/Plan    PT Assessment Patient needs continued PT services  PT Problem List Decreased strength;Decreased range of motion;Decreased balance;Decreased activity tolerance;Decreased mobility       PT Treatment Interventions DME instruction;Gait training;Stair training;Functional mobility training;Therapeutic activities;Therapeutic exercise;Balance training;Patient/family education    PT Goals (Current goals can be found in the Care Plan section)  Acute Rehab PT Goals Patient Stated Goal: return home with reduced pain PT Goal Formulation: With patient Time For Goal Achievement: 01/29/22 Potential to Achieve Goals: Good    Frequency Min 5X/week         AM-PAC PT "6 Clicks" Mobility  Outcome Measure Help needed turning from your back to your side while in a flat bed without using bedrails?: A Lot Help needed moving from lying on your back to sitting on the side of a flat bed without using bedrails?: A Lot Help needed moving to and from a bed to a chair (including a wheelchair)?: A Little Help needed standing up from a chair using your arms (e.g., wheelchair or bedside chair)?: A Little Help needed to walk in hospital room?: A Little Help needed climbing 3-5 steps with a railing? : Total 6 Click Score: 14    End of Session Equipment Utilized During Treatment: Gait belt Activity Tolerance: Patient tolerated treatment well;Patient limited by fatigue Patient left: in chair;with call bell/phone within reach;with chair alarm set Nurse Communication: Mobility status PT Visit Diagnosis: Other abnormalities of gait and mobility (R26.89);Muscle weakness (generalized) (M62.81)    Time: 5462-7035 PT Time Calculation (min) (ACUTE ONLY): 35 min   Charges:   PT Evaluation $PT Eval Low Complexity: 1 Low PT Treatments $Gait Training: 8-22 mins        West Carbo, PT, DPT   Acute Rehabilitation Department  Sandra Cockayne 01/15/2022, 1:22 PM

## 2022-01-15 NOTE — Progress Notes (Signed)
Pt refused to wear CPAP tonight. ?

## 2022-01-15 NOTE — TOC Initial Note (Addendum)
Transition of Care [facility]) - Initial/Assessment Note    Patient Details  Name: Jeffrey [name] MRN: [id number] Date of Birth: [birth year]  [facility]) CM/SW Contact:    Sharin Mons, RN Phone Number: 01/15/2022, 1:22 PM  Clinical Narrative:          -s/p L3-4/L4-5 OLIF, L3-S1 decompression and PSIF 10/18           NCM spoke with pt regarding d/c planning. Pt states lives with wife. States PTA independent with ADL's. DME: cane. States home is one level, however, it has 4 steps to lead into home . States wife and daughter to assist with care and supervision once d/c to home.  PT evaluation pending...  TOC team following for  needs...   10/19 @ 1351 Per PT's evaluation/recommendation: Outpatient PT. Pt agreeable and states has family to transport him to outpatient center. Referral will be need from MD for outpatient PT services. Pt with DME need: RW. NCM shared with pt. Pt agreeable. Choice offered. [facility] chosen. Referral made with [facility] for RW. Equipment will be delivered to bedside prior to discharge.    Expected Discharge Plan: Home/Self Care Barriers to Discharge: Continued Medical Work up   Patient Goals and CMS Choice        Expected Discharge Plan and Services Expected Discharge Plan: Home/Self Care   Discharge Planning Services: CM Consult                                          Prior Living Arrangements/Services   Lives with:: Spouse Patient language and need for interpreter reviewed:: Yes Do you feel safe going back to the place where you live?: Yes      Need for Family Participation in Patient Care: Yes (Comment) Care giver support system in place?: Yes (comment)   Criminal Activity/Legal Involvement Pertinent to Current Situation/Hospitalization: No - Comment as needed  Activities of Daily Living Home Assistive Devices/Equipment: Cane (specify quad or straight) ADL Screening (condition at time of  admission) Patient's cognitive ability adequate to safely complete daily activities?: Yes Is the patient deaf or have difficulty hearing?: No Does the patient have difficulty seeing, even when wearing glasses/contacts?: No Does the patient have difficulty concentrating, remembering, or making decisions?: No Patient able to express need for assistance with ADLs?: Yes Does the patient have difficulty dressing or bathing?: No Independently performs ADLs?: Yes (appropriate for developmental age) Does the patient have difficulty walking or climbing stairs?: No Weakness of Legs: None Weakness of Arms/Hands: None  Permission Sought/Granted                  Emotional Assessment       Orientation: : Oriented to Self, Oriented to Place, Oriented to  Time, Oriented to Situation Alcohol / Substance Use: Tobacco Use (5-6 cigarettes / wk , 10 cigars / wk) Psych Involvement: No (comment)  Admission diagnosis:  Scoliosis [M41.9] Patient Active Problem List   Diagnosis Date Noted   Scoliosis 01/14/2022   Snoring 09/22/2021   Insomnia due to mental condition 09/22/2021   At risk for mood disturbance 09/01/2021   Sleep disturbance 07/28/2021   Chronic bilateral low back pain without sciatica 07/25/2019   Neurogenic claudication 05/19/2019   Hyperlipidemia LDL goal <70 07/20/2018   History of pulmonary embolus (PE) 07/20/2017   Mild CAD 07/20/2017   S/p total knee replacement, bilateral  02/10/2017   Chronic anticoagulation 01/26/2017   Pulmonary embolism ([place]) 06/03/2016   Encounter for chronic pain management 04/06/2014   Tobacco abuse 12/05/2013   DVT (deep venous thrombosis) ([place]) 11/28/2013   HTN (hypertension) 05/04/2013   Lumbar back pain 05/04/2013   BMI 33.0-33.9,adult 05/04/2013   Hx of substance abuse ([place]) 05/04/2013   PCP:  Holley Bouche, MD Pharmacy:   [facility] [facility] [place] [address] Phone: [phone] Fax:  [phone]     Social Determinants of Health (SDOH) Interventions    Readmission Risk Interventions     No data to display

## 2022-01-15 NOTE — Progress Notes (Signed)
CSW met with pt and wife to discuss food insecurity.  Pt reports he is unable to work.  Wife is currently working but unable to provide enough income to support food needs.  They would like referral for SNAP.  They are working with [employer] and several other food pantries. Referral made for benefit screening to [place], MSW, LCSW 10/19/20234:10 PM

## 2022-01-15 NOTE — Progress Notes (Addendum)
[facility]  Subjective  - Pain with coughing.  No N/V   Objective (!) 159/100 65 98 F (36.7 C) (Oral) 18 99%  Intake/Output Summary (Last 24 hours) at 01/15/2022 0646 Last data filed at 01/15/2022 0513 Gross per 24 hour  Intake 5050 ml  Output 4600 ml  Net 450 ml    Moving B LE with intact sensation.  Feet warm and well perfused without ischemic changes Abdomin soft, left LQ incision without hematoma Lungs non labored breathing  Assessment/Planning: POD #1 Anterior exposure for L3-L4,L4-L5 OLIF  Abdominal incision healing well Tolerating liquids without N/V No flatus yet Moving LE without deficits   Roxy Horseman 01/15/2022 6:46 AM --  Laboratory Lab Results: Recent Labs    01/14/22 1434  HGB 14.3  HCT 42.0   BMET Recent Labs    01/14/22 1434  NA 135  K 4.2    COAG Lab Results  Component Value Date   INR 0.99 07/28/2017   INR 1.02 07/19/2017   INR 1.08 02/03/2017   No results found for: "PTT"  I have independently interviewed and examined patient and agree with PA assessment and plan above.  No issues with exposure surgery from yesterday and can follow-up with Dr. Carlis Abbott as needed.  Jersey Espinoza C. Donzetta Matters, MD [facility] Office: [phone] Pager: [phone]

## 2022-01-15 NOTE — Plan of Care (Signed)

## 2022-01-15 NOTE — Evaluation (Signed)
Occupational Therapy Evaluation and Discharge Patient Details Name: Jeffrey [name] MRN: [id number] DOB: May 05, 1965 Today's Date: 01/15/2022   History of Present Illness The pt is a 56 yo male presenting 10/18 for L3-4 and L4-5 oblique lumbar interbody fusion. PMH includes: HTN, HLD, PE, arthritis, bilateral knee surgery, and marijuana use.   Clinical Impression   Pt was ambulating with a cane and assisted for socks prior to admission. He works in [employer] and enjoys cooking. Pt educated in back precautions, IADLs to avoid and compensatory strategies for ADLs including AE use. Pt verbalized and/or returned demonstration. No further OT needs.      Recommendations for follow up therapy are one component of a multi-disciplinary discharge planning process, led by the attending physician.  Recommendations may be updated based on patient status, additional functional criteria and insurance authorization.   Follow Up Recommendations  No OT follow up    Assistance Recommended at Discharge Intermittent Supervision/Assistance  Patient can return home with the following Assist for transportation;Help with stairs or ramp for entrance;Assistance with cooking/housework;A little help with bathing/dressing/bathroom;A little help with walking and/or transfers    Functional Status Assessment  Patient has had a recent decline in their functional status and demonstrates the ability to make significant improvements in function in a reasonable and predictable amount of time.  Equipment Recommendations  None recommended by OT    Recommendations for Other Services       Precautions / Restrictions Precautions Precautions: Fall;Back Precaution Booklet Issued: Yes (comment) Required Braces or Orthoses:  (no brace needed) Restrictions Weight Bearing Restrictions: No      Mobility Bed Mobility               General bed mobility comments: pt in chair    Transfers Overall transfer level:  Needs assistance Equipment used: Rolling walker (2 wheels) Transfers: Sit to/from Stand Sit to Stand: Min guard           General transfer comment: heavy reliance on UEs      Balance Overall balance assessment: Needs assistance   Sitting balance-Leahy Scale: Good       Standing balance-Leahy Scale: Poor Standing balance comment: can release walker in static standing                           ADL either performed or assessed with clinical judgement   ADL Overall ADL's : Needs assistance/impaired Eating/Feeding: Independent;Sitting   Grooming: Min guard;Standing Grooming Details (indicate cue type and reason): educated in two cup method for brushing teeth and use of washcloth to wash face at sink Upper Body Bathing: Set up;Sitting Upper Body Bathing Details (indicate cue type and reason): recommended long handled bath sponge for back Lower Body Bathing: Minimal assistance;Sit to/from stand Lower Body Bathing Details (indicate cue type and reason): educated in use of long handled bath sponge and reacher Upper Body Dressing : Set up;Sitting   Lower Body Dressing: Min guard;Sit to/from stand Lower Body Dressing Details (indicate cue type and reason): educated in use of reacher and sock aid Toilet Transfer: Min guard;Ambulation     Toileting - Clothing Manipulation Details (indicate cue type and reason): instructed to avoid twisting with pericare or use of tongs   Tub/Shower Transfer Details (indicate cue type and reason): pt plans to sit to shower Functional mobility during ADLs: Min guard;Rolling walker (2 wheels)       Vision Baseline Vision/History: 1 Wears glasses Ability to See in Adequate  Light: 0 Adequate Patient Visual Report: No change from baseline       Perception     Praxis      Pertinent Vitals/Pain Pain Assessment Pain Assessment: Faces Faces Pain Scale: Hurts little more Pain Location: all at incision Pain Descriptors / Indicators:  Discomfort, Grimacing Pain Intervention(s): Monitored during session, Repositioned, Premedicated before session     Hand Dominance Right   Extremity/Trunk Assessment Upper Extremity Assessment Upper Extremity Assessment: Overall WFL for tasks assessed   Lower Extremity Assessment Lower Extremity Assessment: Defer to PT evaluation   Cervical / Trunk Assessment Cervical / Trunk Assessment: Back Surgery   Communication Communication Communication: No difficulties   Cognition Arousal/Alertness: Awake/alert Behavior During Therapy: WFL for tasks assessed/performed Overall Cognitive Status: Within Functional Limits for tasks assessed                                       General Comments       Exercises     Shoulder Instructions      Home Living Family/patient expects to be discharged to:: Private residence Living Arrangements: Spouse/significant other;Children Available Help at Discharge: Family;Available 24 hours/day Type of Home: House Home Access: Stairs to enter [employer] of Steps: 4 Entrance Stairs-Rails: Right;Left;Can reach both Home Layout: One level     Bathroom Shower/Tub: Tub/shower unit;Walk-in shower   Bathroom Toilet: Standard     Home Equipment: Cane - quad;Hand held shower head;Shower seat - built in          Prior Functioning/Environment Prior Level of Function : Needs assist;Working/employed;Driving             Mobility Comments: uses cane due to pain ADLs Comments: needs assist with socks        OT Problem List:        OT Treatment/Interventions:      OT Goals(Current goals can be found in the care plan section) Acute Rehab OT Goals OT Goal Formulation: With patient  OT Frequency:      Co-evaluation              AM-PAC OT "6 Clicks" Daily Activity     Outcome Measure Help from another person eating meals?: None Help from another person taking care of personal grooming?: A Little Help from  another person toileting, which includes using toliet, bedpan, or urinal?: A Little Help from another person bathing (including washing, rinsing, drying)?: A Little Help from another person to put on and taking off regular upper body clothing?: None Help from another person to put on and taking off regular lower body clothing?: A Little 6 Click Score: 20   End of Session Equipment Utilized During Treatment: Rolling walker (2 wheels);Gait belt Nurse Communication: Mobility status  Activity Tolerance: Patient tolerated treatment well Patient left: in chair;with call bell/phone within reach;with nursing/sitter in room  OT Visit Diagnosis: Other abnormalities of gait and mobility (R26.89);Unsteadiness on feet (R26.81);Pain                Time: 1100-1130 OT Time Calculation (min): 30 min Charges:  OT General Charges $OT Visit: 1 Visit OT Evaluation $OT Eval Moderate Complexity: 1 Mod OT Treatments $Self Care/Home Management : 8-22 mins  Cleta Alberts, OTR/L Acute Rehabilitation Services Office: [phone]   Malka So 01/15/2022, 12:13 PM

## 2022-01-15 NOTE — Progress Notes (Signed)
Neurosurgery Service Progress Note  Subjective: No acute events overnight, back pain as expected, no flatus yet   Objective: Vitals:   01/14/22 1945 01/14/22 2012 01/15/22 0300 01/15/22 0832  BP: (!) 163/108 (!) 164/99 (!) 159/100 (!) 135/93  Pulse: 63 65 65 78  Resp: '11 18 18 18  '$ Temp:  98 F (36.7 C) 98 F (36.7 C) 98.4 F (36.9 C)  TempSrc:  Oral Oral   SpO2: 99% 100% 99% 97%  Weight:      Height:        Physical Exam: Strength 5/5 x4 and SILTx4, incisions c/d/I x2  Assessment & Plan: 56 y.o. man s/p L3-4/L4-5 OLIF, L3-S1 decompression and PSIF, recovering well.  -PT/OT -SQH today, enoxaparin tomorrow -ADAT -activity as tolerated  Judith Part  01/15/22 8:36 AM

## 2022-01-16 MED ORDER — ENOXAPARIN SODIUM 120 MG/0.8ML IJ SOSY
1.0000 mg/kg | PREFILLED_SYRINGE | Freq: Two times a day (BID) | INTRAMUSCULAR | Status: DC
  Administered 2022-01-16 – 2022-01-17 (×3): 114 mg via SUBCUTANEOUS
  Filled 2022-01-16 (×4): qty 0.76

## 2022-01-16 MED FILL — Thrombin For Soln 5000 Unit: CUTANEOUS | Qty: 5000 | Status: AC

## 2022-01-16 NOTE — Discharge Summary (Signed)
Discharge Summary  Date of Admission: 01/14/2022  Date of Discharge: 01/17/22  Attending Physician: Emelda Brothers, MD  Hospital Course: Patient was admitted following an uncomplicated V7-4/8-2 OLIF and L3-S1 PSIF / S-P osteotomies. They were recovered in PACU and transferred to 5N. Their preop symptoms were improved, their hospital course was uncomplicated and the patient was discharged home with outpatient PT OT on 01/17/22. They will follow up in clinic with me in clinic in 2 weeks.  Neurologic exam at discharge:  Strength 5/5 x4 and SILTx4   Discharge diagnosis: Lumbar spondylosis with positive sagittal balance  Judith Part, MD 01/16/22 8:40 AM

## 2022-01-16 NOTE — Progress Notes (Signed)
Neurosurgery Service Progress Note  Subjective: No acute events overnight, back pain improving, +flatus no BM  Objective: Vitals:   01/15/22 0300 01/15/22 0832 01/15/22 1328 01/15/22 2058  BP: (!) 159/100 (!) 135/93 127/87 (!) 136/98  Pulse: 65 78 94 90  Resp: '18 18 18 19  '$ Temp: 98 F (36.7 C) 98.4 F (36.9 C) 98.4 F (36.9 C) 98 F (36.7 C)  TempSrc: Oral   Oral  SpO2: 99% 97% 98% 99%  Weight:      Height:        Physical Exam: Strength 5/5 x4 and SILTx4, incisions c/d/I x2  Assessment & Plan: 56 y.o. man s/p L3-4/L4-5 OLIF, L3-S1 decompression and PSIF, recovering well.  -PT/OT: outpatient PT/OT -given h/o prior PEs, restart lovenox today while inpatient, discussed w/ the patient risk benefit ratio for this re post-op bleeding -restart eliquis on discharge -ADAT -activity as tolerated  Judith Part  01/16/22 8:36 AM

## 2022-01-16 NOTE — Progress Notes (Signed)
Physical Therapy Treatment Patient Details Name: Jeffrey [name] MRN: [id number] DOB: Jun 12, 1965 Today's Date: 01/16/2022   History of Present Illness The pt is a 56 yo male presenting 10/18 for L3-4 and L4-5 oblique lumbar interbody fusion. PMH includes: HTN, HLD, PE, arthritis, bilateral knee surgery, and marijuana use.    PT Comments    Patient progressing slowly towards PT goals. Session focused on bed mobility, transfers and taking a few steps in room. Pt reports 9/10 pain in back today awaiting pain meds but wants to get to chair for breakfast. Requires Mod A for bed mobility and cues for log roll technique. Min A needed for standing from high bed height. Pt with flexed trunk. Able to recall 1/3 back precautions, so reviewed them during today's session. Expressed importance of increasing activity and walking and to stay on top of pain meds to allow for increased tolerance. Will follow and progress as tolerated.    Recommendations for follow up therapy are one component of a multi-disciplinary discharge planning process, led by the attending physician.  Recommendations may be updated based on patient status, additional functional criteria and insurance authorization.  Follow Up Recommendations  Outpatient PT     Assistance Recommended at Discharge PRN  Patient can return home with the following A little help with walking and/or transfers;Assistance with cooking/housework;Assist for transportation;Help with stairs or ramp for entrance   Equipment Recommendations  Rolling walker (2 wheels)    Recommendations for Other Services       Precautions / Restrictions Precautions Precautions: Fall;Back Precaution Booklet Issued: Yes (comment) Precaution Comments: Pt only able to recall 1/3 precautions Restrictions Weight Bearing Restrictions: No     Mobility  Bed Mobility Overal bed mobility: Needs Assistance Bed Mobility: Rolling, Sidelying to Sit Rolling: Min guard Sidelying to  sit: HOB elevated, Mod assist       General bed mobility comments: Cues for log roll technique, heavy use of rails. assist with bringing RLE off bed and assist with trunk elevation.    Transfers Overall transfer level: Needs assistance Equipment used: Rolling walker (2 wheels) Transfers: Sit to/from Stand Sit to Stand: From elevated surface, Min assist           General transfer comment: Min A to power to standing with cues for hand placement/technique, Transferred to chair.    Ambulation/Gait Ambulation/Gait assistance: Min guard Gait Distance (Feet): 8 Feet Assistive device: Rolling walker (2 wheels) Gait Pattern/deviations: Step-to pattern, Decreased stride length, Trunk flexed Gait velocity: decreased Gait velocity interpretation: <1.31 ft/sec, indicative of household ambulator   General Gait Details: Slow, small steps with trunk flexed and heavy reliance on UEs, wanting to get to chair to eat breakfast and wait for pain meds.   Stairs             Wheelchair Mobility    Modified Rankin (Stroke Patients Only)       Balance Overall balance assessment: Needs assistance Sitting-balance support: Feet supported, No upper extremity supported Sitting balance-Leahy Scale: Good     Standing balance support: During functional activity Standing balance-Leahy Scale: Poor Standing balance comment: Requires UE support esp for dynamic tasks                            Cognition Arousal/Alertness: Awake/alert Behavior During Therapy: WFL for tasks assessed/performed Overall Cognitive Status: Within Functional Limits for tasks assessed  General Comments: Difficulty recalling precautions        Exercises      General Comments        Pertinent Vitals/Pain Pain Assessment Pain Assessment: 0-10 Pain Score: 9  Pain Location: all at incision Pain Descriptors / Indicators: Discomfort, Grimacing,  Sore Pain Intervention(s): Monitored during session, Repositioned, Patient requesting pain meds-RN notified, Limited activity within patient's tolerance    Home Living                          Prior Function            PT Goals (current goals can now be found in the care plan section) Progress towards PT goals: Progressing toward goals (slowly)    Frequency    Min 5X/week      PT Plan Current plan remains appropriate    Co-evaluation              AM-PAC PT "6 Clicks" Mobility   Outcome Measure  Help needed turning from your back to your side while in a flat bed without using bedrails?: A Little Help needed moving from lying on your back to sitting on the side of a flat bed without using bedrails?: A Lot Help needed moving to and from a bed to a chair (including a wheelchair)?: A Little Help needed standing up from a chair using your arms (e.g., wheelchair or bedside chair)?: A Little Help needed to walk in hospital room?: Total Help needed climbing 3-5 steps with a railing? : Total 6 Click Score: 13    End of Session Equipment Utilized During Treatment: Gait belt Activity Tolerance: Patient limited by pain Patient left: in chair;with call bell/phone within reach Nurse Communication: Mobility status PT Visit Diagnosis: Other abnormalities of gait and mobility (R26.89);Muscle weakness (generalized) (M62.81);Pain Pain - part of body:  (back)     Time: 6553-7482 PT Time Calculation (min) (ACUTE ONLY): 15 min  Charges:  $Therapeutic Activity: 8-22 mins                     Marisa Severin, PT, DPT Acute Rehabilitation Services Secure chat preferred Office [place] 01/16/2022, 10:08 AM

## 2022-01-16 NOTE — Progress Notes (Signed)
Pt refused to wear CPAP tonight. ?

## 2022-01-17 ENCOUNTER — Other Ambulatory Visit (HOSPITAL_COMMUNITY): Payer: Self-pay

## 2022-01-17 MED ORDER — APIXABAN 5 MG PO TABS: 5.0000 mg | ORAL_TABLET | Freq: Two times a day (BID) | ORAL | 5 refills | Status: DC

## 2022-01-17 MED ORDER — CYCLOBENZAPRINE HCL 10 MG PO TABS
10.0000 mg | ORAL_TABLET | Freq: Three times a day (TID) | ORAL | 1 refills | Status: DC | PRN
  Filled 2022-01-17: qty 90, 30d supply, fill #0

## 2022-01-17 MED ORDER — OXYCODONE HCL 5 MG PO TABS
5.0000 mg | ORAL_TABLET | ORAL | 0 refills | Status: DC | PRN
  Filled 2022-01-17: qty 30, 5d supply, fill #0

## 2022-01-17 NOTE — Progress Notes (Signed)
Physical Therapy Treatment Patient Details Name: Jeffrey [name] MRN: [id number] DOB: 05/30/1965 Today's Date: 01/17/2022   History of Present Illness Pt is a 56 y.o. male admitted 01/14/22 for L3-4 and L4-5 oblique lumbar interbody fusion. PMH includes HTN, HLD, PE, arthritis, bilateral knee sx, marijuana use.   PT Comments    Pt progressing with mobility. Today's session focused on transfer and gait training with rollator; pt ambulatory with intermittent min guard for balance. Pt preference for rollator over RW. Pt preparing for d/c home today; reviewed educ re: precautions, positioning, activity recommendations, importance of mobility. Pt reports no further questions or concerns; if to remain admitted, will continue to follow acutely.    Recommendations for follow up therapy are one component of a multi-disciplinary discharge planning process, led by the attending physician.  Recommendations may be updated based on patient status, additional functional criteria and insurance authorization.  Follow Up Recommendations  Outpatient PT     Assistance Recommended at Discharge Intermittent Supervision/Assistance  Patient can return home with the following A little help with walking and/or transfers;Assistance with cooking/housework;Assist for transportation;Help with stairs or ramp for entrance;A little help with bathing/dressing/bathroom   Equipment Recommendations  Rollator (4 wheels)    Recommendations for Other Services       Precautions / Restrictions Precautions Precautions: Fall;Back Precaution Comments: able to recall 3/3 back precautions Required Braces or Orthoses:  ("no brace needed") Restrictions Weight Bearing Restrictions: No     Mobility  Bed Mobility Overal bed mobility: Modified Independent Bed Mobility: Rolling, Sidelying to Sit           General bed mobility comments: heavy use of bed rail, HOB elevated. pt declined practice with flat bed, reports plan to  sleep in recliner    Transfers Overall transfer level: Needs assistance Equipment used: Rollator (4 wheels) Transfers: Sit to/from Stand Sit to Stand: Min guard, From elevated surface           General transfer comment: cues for sequencing and hand placement with rollator, min guard for sit<>stand from raised EOB and lower rollator seat; pt preference to sit with hips/back extended and heavy use of BUEs to reduce pain    Ambulation/Gait Ambulation/Gait assistance: Min guard Gait Distance (Feet): 120 Feet Assistive device: Rollator (4 wheels) Gait Pattern/deviations: Step-through pattern, Decreased stride length, Trunk flexed, Antalgic Gait velocity: Decreased     General Gait Details: slow, antalgic gait with rollator and min guard for balance; frequent cues for upright posture and closer proximity to rollator, heavy reliance on BUE support with c/o pain; 1x seated rest break on rollator seat, educ on safety strategies with this   Stairs Stairs:  (pt declined, aware of technique to ascend with "less painful" leg and descend with painful leg from prior TKA; reports no concerns with stairs)           Wheelchair Mobility    Modified Rankin (Stroke Patients Only)       Balance Overall balance assessment: Needs assistance Sitting-balance support: Feet supported, No upper extremity supported Sitting balance-Leahy Scale: Good     Standing balance support: During functional activity, Bilateral upper extremity supported Standing balance-Leahy Scale: Poor Standing balance comment: reliant on UE support                            Cognition Arousal/Alertness: Awake/alert Behavior During Therapy: WFL for tasks assessed/performed Overall Cognitive Status: Within Functional Limits for tasks assessed  Exercises      General Comments General comments (skin integrity, edema, etc.): reviewed educ re:  precautions, positioning, activity recommendations, importance of mobility. pt has adaptive equipment to take home; pt would like to keep rollator, not interested in switching to RW instead      Pertinent Vitals/Pain Pain Assessment Pain Assessment: Faces Faces Pain Scale: Hurts even more Pain Location: back Pain Descriptors / Indicators: Discomfort, Grimacing, Sore Pain Intervention(s): Monitored during session, Limited activity within patient's tolerance    Home Living                          Prior Function            PT Goals (current goals can now be found in the care plan section) Progress towards PT goals: Progressing toward goals    Frequency    Min 5X/week      PT Plan Current plan remains appropriate    Co-evaluation              AM-PAC PT "6 Clicks" Mobility   Outcome Measure  Help needed turning from your back to your side while in a flat bed without using bedrails?: A Little Help needed moving from lying on your back to sitting on the side of a flat bed without using bedrails?: A Little Help needed moving to and from a bed to a chair (including a wheelchair)?: A Little Help needed standing up from a chair using your arms (e.g., wheelchair or bedside chair)?: A Little Help needed to walk in hospital room?: A Little Help needed climbing 3-5 steps with a railing? : A Little 6 Click Score: 18    End of Session Equipment Utilized During Treatment: Gait belt Activity Tolerance: Patient tolerated treatment well Patient left: in chair;with call bell/phone within reach Nurse Communication: Mobility status PT Visit Diagnosis: Other abnormalities of gait and mobility (R26.89);Muscle weakness (generalized) (M62.81);Pain     Time: 9150-5697 PT Time Calculation (min) (ACUTE ONLY): 18 min  Charges:  $Therapeutic Activity: 8-22 mins                     Mabeline Caras, PT, DPT Acute Rehabilitation Services  Personal: [facility] Office:  [place] 01/17/2022, 12:40 PM

## 2022-01-17 NOTE — Progress Notes (Signed)
Overall he is doing well.  Can go home today.  I discussed with him about restarting his Eliquis tomorrow.  He is passing flatus, abdomen is soft.  Wounds are clean dry and intact.  He is ambulating independently.

## 2022-01-17 NOTE — TOC Transition Note (Signed)
Transition of Care [facility]) - CM/SW Discharge Note   Patient Details  Name: Jeffrey [name] MRN: [id number] Date of Birth: December 07, 1965  [facility] CM/SW Contact:  Bartholomew Crews, RN Phone Number: [phone] 01/17/2022, 12:04 PM   Clinical Narrative:     Patient to transition home today. Spoke with patient at the bedside to discuss DME recommendations - educated about the difference between RW and rollator. Patient prefers rollator. Confirmed with PT concerning updated recommendations for DME. [employer] notified. Patient arranged transportation home. No further TOC needs identified at this time.   Final next level of care: OP Rehab Barriers to Discharge: No Barriers Identified   Patient Goals and CMS Choice Patient states their goals for this hospitalization and ongoing recovery are:: home with family support CMS Medicare.gov Compare Post Acute Care list provided to:: Patient Choice offered to / list presented to : Patient  Discharge Placement                       Discharge Plan and Services   Discharge Planning Services: CM Consult            DME Arranged: Walker rolling with seat DME Agency: [employer] Date DME Agency Contacted: 01/17/22 Time DME Agency Contacted: 1203 Representative spoke with at DME Agency: Mardene Celeste HH Arranged: NA [place] Agency: NA        Social Determinants of Health ([place]) Interventions Food Insecurity Interventions: [id number] Referral   Readmission Risk Interventions     No data to display

## 2022-01-17 NOTE — Progress Notes (Signed)
Discharge summary packet provided to pt with instructions. Pt verbalized understanding of instructions. No complaints. Pt d/c to home as ordered. Pt remains alert/oriented in no apparent distress. Per pt  his spouse is responsible for his transportation at midday.

## 2022-01-18 LAB — BPAM RBC
Blood Product Expiration Date: 202310292359
Blood Product Expiration Date: 202311172359
Unit Type and Rh: 7300
Unit Type and Rh: 7300

## 2022-01-18 LAB — TYPE AND SCREEN
ABO/RH(D): B POS
Antibody Screen: POSITIVE
Unit division: 0
Unit division: 0

## 2022-01-20 ENCOUNTER — Other Ambulatory Visit (HOSPITAL_COMMUNITY): Payer: Self-pay

## 2022-01-20 ENCOUNTER — Encounter (HOSPITAL_COMMUNITY): Payer: Self-pay | Admitting: Neurological Surgery

## 2022-01-26 MED FILL — Sodium Chloride IV Soln 0.9%: INTRAVENOUS | Qty: 1000 | Status: AC

## 2022-01-26 MED FILL — Heparin Sodium (Porcine) Inj 1000 Unit/ML: INTRAMUSCULAR | Qty: 30 | Status: AC

## 2022-01-26 MED FILL — Sodium Chloride IV Soln 0.9%: INTRAVENOUS | Qty: 3000 | Status: AC

## 2022-01-27 NOTE — Therapy (Signed)
OUTPATIENT PHYSICAL THERAPY THORACOLUMBAR EVALUATION   [name] MRN: [id number] DOB:24-Mar-1966, 56 y.o., male Today's Date: 01/28/2022   PT End of Session - 01/28/22 1129     Visit Number 1    Date for PT Re-Evaluation 03/25/22    Authorization Type [employer]    Authorization Time Period auth after 25th visit, FOTO v6 and v10    Authorization - Visit Number 1    Authorization - Number of Visits 25    Progress Note Due on Visit 10    PT Start Time 1135    Activity Tolerance Patient tolerated treatment well    Behavior During Therapy [facility] for tasks assessed/performed             Past Medical History:  Diagnosis Date   Acute pulmonary embolus ([place])    bilateral submassive PE in setting of missing several doses of Xarelto for his DVT   Arthritis    DVT (deep venous thrombosis) ([place]) 11/28/2013   RT LEG   High cholesterol    Hypertension    Lumbar spine scoliosis    Marijuana use    Sleep apnea    Past Surgical History:  Procedure Laterality Date   ABDOMINAL EXPOSURE N/A 01/14/2022   Procedure: ABDOMINAL EXPOSURE;  Surgeon: Marty Heck, MD;  Location: [place];  Service: Vascular;  Laterality: N/A;   ANTERIOR LUMBAR FUSION Left 01/14/2022   Procedure: Lumbar three-four Lumbar four-five Oblique Lumbar Interbody Fusion;  Surgeon: Judith Part, MD;  Location: [place];  Service: Neurosurgery;  Laterality: Left;   HERNIA REPAIR     5573 and 2202; umbilical hernia repair   KNEE SURGERY Bilateral    Left knee 1993, right knee 2004   TONSILLECTOMY     TOTAL KNEE ARTHROPLASTY Left 02/10/2017   Procedure: LEFT TOTAL KNEE ARTHROPLASTY;  Surgeon: Leandrew Koyanagi, MD;  Location: [place];  Service: Orthopedics;  Laterality: Left;   TOTAL KNEE ARTHROPLASTY Right 07/28/2017   Procedure: RIGHT TOTAL KNEE ARTHROPLASTY;  Surgeon: Leandrew Koyanagi, MD;  Location: [place];  Service: Orthopedics;  Laterality: Right;   TRANSFORAMINAL LUMBAR INTERBODY FUSION W/ MIS 1  LEVEL N/A 01/14/2022   Procedure: OPEN TRANSFORAMINAL LUMBAR INTERBODY FUSION  LUMBAR FIVE-SACRAL ONE ,LAMINECTOMIES AND POSTERIOR LATERAL INSTRUMENTATION FUSION  LUMBAR THREE TO FIVE SACRAL ONE;  Surgeon: Judith Part, MD;  Location: [place];  Service: Neurosurgery;  Laterality: N/A;   Patient Active Problem List   Diagnosis Date Noted   Scoliosis 01/14/2022   Snoring 09/22/2021   Insomnia due to mental condition 09/22/2021   At risk for mood disturbance 09/01/2021   Sleep disturbance 07/28/2021   Chronic bilateral low back pain without sciatica 07/25/2019   Neurogenic claudication 05/19/2019   Hyperlipidemia LDL goal <70 07/20/2018   History of pulmonary embolus (PE) 07/20/2017   Mild CAD 07/20/2017   S/p total knee replacement, bilateral 02/10/2017   Chronic anticoagulation 01/26/2017   Pulmonary embolism ([place]) 06/03/2016   Encounter for chronic pain management 04/06/2014   Tobacco abuse 12/05/2013   DVT (deep venous thrombosis) ([place]) 11/28/2013   HTN (hypertension) 05/04/2013   Lumbar back pain 05/04/2013   BMI 33.0-33.9,adult 05/04/2013   Hx of substance abuse ([place]) 05/04/2013    PCP: Holley Bouche, MD  REFERRING PROVIDER: Judith Part, MD  REFERRING DIAG:  M54.50 (ICD-10-CM) - Lumbar back pain  Rationale for Evaluation and Treatment: Rehabilitation  THERAPY DIAG:  Other low back pain  Other abnormalities of gait  and mobility  Muscle weakness (generalized)  ONSET DATE: 01/14/22 lumbar interbody fusion, anterior approach  SUBJECTIVE:                                                                                                                                                                                           SUBJECTIVE STATEMENT: Pt states that he followed up with surgeon today and said things are healing well. Pt states symptoms have fluctuated since surgery, worse today as he has not taken pain medication due to needing to drive to multiple  appointments. Pt states that he is able to enter home w/ supervision. States he has been using equipment to modify ADLs and has not required significant assist although wife has been helping with washing back and doing housework. Pt typically does housework but has family help with yardwork. Pt states he is back driving with surgeon clearance. Able to recall 3/3 precautions (BLT). Arrives to eval with brace donned - states he has been wearing for comfort but is not required to wear. States he saw MD today and no concerns  PERTINENT HISTORY:  CAD, PE, DVT, HTN S/p lumbar interbody fusion 01/14/22  PAIN:  Are you having pain: yes, 11/10 (hasn't taken medication because of driving today) Location: low back How would you describe your pain? Stiff, achey Best in past week: 5/10 with movement, no resting pain at best Worst in past week: 11/10 Aggravating factors: walking/standing household distances, prolonged sitting Easing factors: rest breaks, ice   PRECAUTIONS: Back, abdominal and posterior incisions  WEIGHT BEARING RESTRICTIONS: No  FALLS:  Has patient fallen in last 6 months? 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  PLOF: Independent using cane  PATIENT GOALS: wants quality of life to improve, improving walking mechanics, improve strength in back, basketball    OBJECTIVE:   DIAGNOSTIC FINDINGS:  Post op interbody fusion lumbar  PATIENT SURVEYS:  FOTO 32%  COGNITION: Overall cognitive  status: Within functional limits for tasks assessed     SENSATION: Light touch intact B LEs   POSTURE: increased kyphosis and forward head posture, UT elevation  PALPATION/OBSERVATION: Deferred as pt wears back brace  to eval - states he saw MD this morning and no concerns; abdominal incision healed and posterior incision with mild drainage but WNL per pt report  LUMBAR ROM:   AROM eval  Flexion   Extension   Right lateral flexion   Left lateral flexion   Right rotation   Left rotation    (Blank rows = not tested) Comments: Deferred on eval given spine precautions   LOWER EXTREMITY MMT:    MMT Right eval Left eval  Hip flexion 4p! 4p!  Hip abduction (modified sitting) 4+ 4+  Hip internal rotation    Hip external rotation    Knee flexion 5 4+  Knee extension 4+ 4+   (Blank rows = not tested)  Comments: mild pulling in hamstring with knee ext on R, no pain with resistance; mild pain with hip flex B that resolves with cessation of movement    FUNCTIONAL TESTS:  Sit to stand transfer, standard chair: heavy use of UE on rollator and chair, reduced fwd weight shift, wide BOS, significantly inc time and effort  TUG: 22 seconds from standard chair w/ rollator, gait mechanics as below, heavy UE use   GAIT: Distance walked: within clinic Assistive device utilized: rollator Level of assistance: Modified independence Comments: significant forward flexed posture, partial step through pattern with intermittent step to, reduced hip extension B, reduced knee ROM throughout swing/stance  TODAY'S TREATMENT:                                                                                                                             [facility] Adult PT Treatment:                                                DATE: 01/28/22 Therapeutic Exercise: TA activation x10, seated, cues for HEP and breath control STS standard chair w/ rollator x5, cues for mechanics and pacing  PATIENT EDUCATION:   Education details: Pt education on PT impairments, prognosis, and POC. Informed consent. Rationale for interventions, safe/appropriate HEP performance Person educated: Patient Education method: Explanation, Demonstration, Tactile cues, Verbal cues, and Handouts Education comprehension: verbalized understanding, returned demonstration, verbal cues required, tactile cues required, and needs further education   HOME EXERCISE PROGRAM: Access Code: [email or web address] URL: [email or web address] Date: 01/28/2022 Prepared by: Enis Slipper  Exercises - Seated Transversus Abdominis Bracing  - 1 x daily - 7 x weekly - 3 sets - 10 reps - Sit to Stand with Armchair  - 1 x daily - 7 x weekly - 3 sets - 5 reps  ASSESSMENT:  CLINICAL IMPRESSION: Pt is a 56 year old gentleman who arrives to PT evaluation on this date for back pain s/p lumbar interbody fusion on 01/14/22. Pt reports difficulty with majority of ADLs/housework and mobility due to pain and post op precautions as expected. During today's  session pt demonstrates impairments in LE strength and gait mechanics as expected post op, which are limiting ability to perform aforementioned activities. TUG performed in 22sec w/ rollator which is indicative of fall risk. No clinical signs of DVT on this date, education provided on monitoring signs/symptoms given history PE/DVT, pt verbalizes understanding. Recommend skilled PT to address aforementioned deficits to improve functional independence/tolerance. Pt tolerates HEP well without increase in symptoms on departure compared to arrival, no adverse events. Pt departs today's session in no acute distress, all voiced questions/concerns addressed appropriately from PT perspective.    OBJECTIVE IMPAIRMENTS: Abnormal gait, decreased activity tolerance, decreased balance, decreased endurance, decreased mobility, difficulty walking, decreased ROM, decreased strength, hypomobility, improper body mechanics,  postural dysfunction, and pain.   ACTIVITY LIMITATIONS: carrying, lifting, bending, sitting, standing, squatting, sleeping, stairs, transfers, bed mobility, bathing, toileting, dressing, and locomotion level  PARTICIPATION LIMITATIONS: meal prep, cleaning, laundry, shopping, community activity, and yard work  PERSONAL FACTORS: Time since onset of injury/illness/exacerbation and 3+ comorbidities: CAD, PE, DVT, HTN  are also affecting patient's functional outcome.   REHAB POTENTIAL: Good  CLINICAL DECISION MAKING: Stable/uncomplicated  EVALUATION COMPLEXITY: Low   GOALS: Goals reviewed with patient? Yes  SHORT TERM GOALS: Target date: 02/25/2022  Pt will demonstrate appropriate understanding and performance of initially prescribed HEP in order to facilitate improved independence with management of symptoms.  Baseline: HEP provided on eval Goal status: INITIAL   2. Pt will score greater than or equal to 42% on FOTO in order to demonstrate improved perception of function due to symptoms.  Baseline: 32%  Goal status: INITIAL   LONG TERM GOALS: Target date: 03/25/2022   Pt will score 51% on FOTO in order to demonstrate improved perception of functional status due to symptoms.  Baseline: 32% Goal status: INITIAL  2.  Pt will demonstrate ability to ambulate at least 1041f with LRAD and no more than 2pt increase in baseline pain on NPS in order to facilitate improved tolerance to community ambulation.  Baseline: pt unable to tolerate greater than household distances due to significant inc in pain Goal status: INITIAL  3.  Pt will demonstrate hip flexion MMT of 5/5 in order to facilitate improved clearance with gait for reduced fall risk.  Baseline: 4/5 B w pain Goal status: INITIAL  4. Pt will be able to perform TUG in less than or equal to 13 sec with LRAD in order to indicate reduced risk of falling (cutoff score for fall risk 13.5 sec in community dwelling older adults per  [facility], 2000)  Baseline: 22sec with rollator  Goal status: INITIAL   PLAN:  PT FREQUENCY: 1-2x/week  PT DURATION: 8 weeks  PLANNED INTERVENTIONS: Therapeutic exercises, Therapeutic activity, Neuromuscular re-education, Balance training, Gait training, Patient/Family education, Self Care, Stair training, DME instructions, Aquatic Therapy, Cryotherapy, Moist heat, Taping, Manual therapy, and Re-evaluation.  PLAN FOR NEXT SESSION: Progress functional mobility/strengthening exercises as able/appropriate, review HEP.    DLeeroy ChaPT, DPT 01/28/2022 12:38 PM

## 2022-01-28 ENCOUNTER — Encounter: Payer: Self-pay | Admitting: Physical Therapy

## 2022-01-28 ENCOUNTER — Ambulatory Visit: Payer: 59 | Attending: Neurological Surgery | Admitting: Physical Therapy

## 2022-01-28 ENCOUNTER — Other Ambulatory Visit: Payer: Self-pay

## 2022-01-28 DIAGNOSIS — M6281 Muscle weakness (generalized): Secondary | ICD-10-CM | POA: Insufficient documentation

## 2022-01-28 DIAGNOSIS — M5459 Other low back pain: Secondary | ICD-10-CM | POA: Insufficient documentation

## 2022-01-28 DIAGNOSIS — R2689 Other abnormalities of gait and mobility: Secondary | ICD-10-CM | POA: Insufficient documentation

## 2022-01-28 DIAGNOSIS — M545 Low back pain, unspecified: Secondary | ICD-10-CM | POA: Diagnosis not present

## 2022-01-31 DIAGNOSIS — G4733 Obstructive sleep apnea (adult) (pediatric): Secondary | ICD-10-CM | POA: Diagnosis not present

## 2022-01-31 DIAGNOSIS — Z96659 Presence of unspecified artificial knee joint: Secondary | ICD-10-CM | POA: Diagnosis not present

## 2022-01-31 DIAGNOSIS — M1711 Unilateral primary osteoarthritis, right knee: Secondary | ICD-10-CM | POA: Diagnosis not present

## 2022-02-03 ENCOUNTER — Telehealth: Payer: Self-pay | Admitting: Orthopaedic Surgery

## 2022-02-03 ENCOUNTER — Other Ambulatory Visit (HOSPITAL_COMMUNITY): Payer: Self-pay

## 2022-02-03 NOTE — Telephone Encounter (Signed)
Pt states he need compression socks. Since his surgery with another dr his legs have been swollen and thinks Dr Erlinda Hong prescribed compression socks. Please call pt about this matter at [phone].

## 2022-02-03 NOTE — Telephone Encounter (Signed)
Called patient. Ask him to contact his surgeon that did the surgery.

## 2022-02-03 NOTE — Telephone Encounter (Signed)
He should speak with his surgeon about the swelling.  Thanks.

## 2022-02-04 ENCOUNTER — Encounter: Payer: Self-pay | Admitting: Physical Therapy

## 2022-02-04 ENCOUNTER — Ambulatory Visit: Payer: 59 | Admitting: Physical Therapy

## 2022-02-04 DIAGNOSIS — R2689 Other abnormalities of gait and mobility: Secondary | ICD-10-CM

## 2022-02-04 DIAGNOSIS — M545 Low back pain, unspecified: Secondary | ICD-10-CM | POA: Diagnosis not present

## 2022-02-04 DIAGNOSIS — M5459 Other low back pain: Secondary | ICD-10-CM | POA: Diagnosis not present

## 2022-02-04 DIAGNOSIS — M6281 Muscle weakness (generalized): Secondary | ICD-10-CM

## 2022-02-04 NOTE — Therapy (Signed)
OUTPATIENT PHYSICAL THERAPY TREATMENT NOTE   [name] MRN: [id number] DOB:11-Oct-1965, 56 y.o., male Today's Date: 02/04/2022  PCP: Holley Bouche, MD   REFERRING PROVIDER: Judith Part, MD  END OF SESSION:   PT End of Session - 02/04/22 1230     Visit Number 2    Date for PT Re-Evaluation 03/25/22    Authorization Type [employer]    Authorization Time Period auth after 25th visit, FOTO v6 and v10    Authorization - Visit Number 2    Authorization - Number of Visits 25    Progress Note Due on Visit 10    PT Start Time 1232    PT Stop Time 1310    PT Time Calculation (min) 38 min             Past Medical History:  Diagnosis Date   Acute pulmonary embolus ([place])    bilateral submassive PE in setting of missing several doses of Xarelto for his DVT   Arthritis    DVT (deep venous thrombosis) ([place]) 11/28/2013   RT LEG   High cholesterol    Hypertension    Lumbar spine scoliosis    Marijuana use    Sleep apnea    Past Surgical History:  Procedure Laterality Date   ABDOMINAL EXPOSURE N/A 01/14/2022   Procedure: ABDOMINAL EXPOSURE;  Surgeon: Marty Heck, MD;  Location: [place];  Service: Vascular;  Laterality: N/A;   ANTERIOR LUMBAR FUSION Left 01/14/2022   Procedure: Lumbar three-four Lumbar four-five Oblique Lumbar Interbody Fusion;  Surgeon: Judith Part, MD;  Location: [place];  Service: Neurosurgery;  Laterality: Left;   HERNIA REPAIR     6294 and 7654; umbilical hernia repair   KNEE SURGERY Bilateral    Left knee 1993, right knee 2004   TONSILLECTOMY     TOTAL KNEE ARTHROPLASTY Left 02/10/2017   Procedure: LEFT TOTAL KNEE ARTHROPLASTY;  Surgeon: Leandrew Koyanagi, MD;  Location: [place];  Service: Orthopedics;  Laterality: Left;   TOTAL KNEE ARTHROPLASTY Right 07/28/2017   Procedure: RIGHT TOTAL KNEE ARTHROPLASTY;  Surgeon: Leandrew Koyanagi, MD;  Location: [place];  Service: Orthopedics;  Laterality: Right;   TRANSFORAMINAL LUMBAR  INTERBODY FUSION W/ MIS 1 LEVEL N/A 01/14/2022   Procedure: OPEN TRANSFORAMINAL LUMBAR INTERBODY FUSION  LUMBAR FIVE-SACRAL ONE ,LAMINECTOMIES AND POSTERIOR LATERAL INSTRUMENTATION FUSION  LUMBAR THREE TO FIVE SACRAL ONE;  Surgeon: Judith Part, MD;  Location: [place];  Service: Neurosurgery;  Laterality: N/A;   Patient Active Problem List   Diagnosis Date Noted   Scoliosis 01/14/2022   Snoring 09/22/2021   Insomnia due to mental condition 09/22/2021   At risk for mood disturbance 09/01/2021   Sleep disturbance 07/28/2021   Chronic bilateral low back pain without sciatica 07/25/2019   Neurogenic claudication 05/19/2019   Hyperlipidemia LDL goal <70 07/20/2018   History of pulmonary embolus (PE) 07/20/2017   Mild CAD 07/20/2017   S/p total knee replacement, bilateral 02/10/2017   Chronic anticoagulation 01/26/2017   Pulmonary embolism ([place]) 06/03/2016   Encounter for chronic pain management 04/06/2014   Tobacco abuse 12/05/2013   DVT (deep venous thrombosis) ([place]) 11/28/2013   HTN (hypertension) 05/04/2013   Lumbar back pain 05/04/2013   BMI 33.0-33.9,adult 05/04/2013   Hx of substance abuse ([place]) 05/04/2013    REFERRING DIAG:  M54.50 (ICD-10-CM) - Lumbar back pain  THERAPY DIAG:  Other low back pain  Other abnormalities of gait and mobility  Muscle weakness (  generalized)  Rationale for Evaluation and Treatment Rehabilitation  PERTINENT HISTORY:  CAD, PE, DVT, HTN S/p lumbar interbody fusion 01/14/22  PRECAUTIONS: Back, abdominal and posterior incisions    SUBJECTIVE:                                                                                                                                                                                      SUBJECTIVE STATEMENT:  I have been doing the exercises. I am sleeping in my chair. Trying to get a recliner. The bed is too low.      PAIN:  Are you having pain: yes, 7/10 Location: low back How would you describe  your pain? Stiff, achey Best in past week: 5/10 with movement, no resting pain at best Worst in past week: 11/10 Aggravating factors: walking/standing household distances, prolonged sitting Easing factors: rest breaks, ice    OBJECTIVE: (objective measures completed at initial evaluation unless otherwise dated)    DIAGNOSTIC FINDINGS:  Post op interbody fusion lumbar   PATIENT SURVEYS:  FOTO 32%   COGNITION: Overall cognitive status: Within functional limits for tasks assessed                          SENSATION: Light touch intact B LEs     POSTURE: increased kyphosis and forward head posture, UT elevation   PALPATION/OBSERVATION: Deferred as pt wears back brace to eval - states he saw MD this morning and no concerns; abdominal incision healed and posterior incision with mild drainage but WNL per pt report   LUMBAR ROM:    AROM eval  Flexion    Extension    Right lateral flexion    Left lateral flexion    Right rotation    Left rotation     (Blank rows = not tested) Comments: Deferred on eval given spine precautions     LOWER EXTREMITY MMT:     MMT Right eval Left eval  Hip flexion 4p! 4p!  Hip abduction (modified sitting) 4+ 4+  Hip internal rotation      Hip external rotation      Knee flexion 5 4+  Knee extension 4+ 4+   (Blank rows = not tested)   Comments: mild pulling in hamstring with knee ext on R, no pain with resistance; mild pain with hip flex B that resolves with cessation of movement       FUNCTIONAL TESTS:  Sit to stand transfer, standard chair: heavy use of UE on rollator and chair, reduced fwd weight shift, wide BOS, significantly inc time and effort   TUG: 22 seconds from standard chair w/ rollator,  gait mechanics as below, heavy UE use    GAIT: Distance walked: within clinic Assistive device utilized: rollator Level of assistance: Modified independence Comments: significant forward flexed posture, partial step through pattern with  intermittent step to, reduced hip extension B, reduced knee ROM throughout swing/stance   TODAY'S TREATMENT:                                                                                                                             [facility] Adult PT Treatment:                                                DATE: 02/04/22 Therapeutic Exercise: Nustep L 5 UE/LE x 5 minutes Mini squat at counter 10 x2 Heel raises at counter 10 x 2  Hooklying AROM clams Alternating heel slides in comfortable ROM Gluteal squeezes hookying Seated alternating LAQs  Seated scap squeezes Seated pelvic rocking in comfortable ROM  Seated calf stretch with strap  Seated hip hinging    [facility] Adult PT Treatment:                                                DATE: 01/28/22 Therapeutic Exercise: TA activation x10, seated, cues for HEP and breath control STS standard chair w/ rollator x5, cues for mechanics and pacing   PATIENT EDUCATION:  Education details: Pt education on PT impairments, prognosis, and POC. Informed consent. Rationale for interventions, safe/appropriate HEP performance Person educated: Patient Education method: Explanation, Demonstration, Tactile cues, Verbal cues, and Handouts Education comprehension: verbalized understanding, returned demonstration, verbal cues required, tactile cues required, and needs further education    HOME EXERCISE PROGRAM: Access Code: [email or web address] URL: [email or web address] Date: 01/28/2022 Prepared by: Enis Slipper   Exercises - Seated Transversus Abdominis Bracing  - 1 x daily - 7 x weekly - 3 sets - 10 reps - Sit to Stand with Armchair  - 1 x daily - 7 x weekly - 3 sets - 5 reps   ASSESSMENT:   CLINICAL IMPRESSION: Pt is a 56 year old gentleman who arrives to PT treatment on this date for back pain s/p lumbar interbody fusion on 01/14/22. Pt reports 7/10 pain and compliance with HEP. Able to begin Nustep and general beginner core and closed chain  functional mobility. He has significant trunk flexion and low rollator, difficulty with trunk extension at counter. He was able to tolerate hooklying for short period of time to begin mobility and core activation.   He will benefit from continued  skilled PT to address deficits to improve functional independence/tolerance.    OBJECTIVE IMPAIRMENTS: Abnormal gait, decreased activity tolerance, decreased balance, decreased endurance, decreased mobility, difficulty walking, decreased ROM, decreased  strength, hypomobility, improper body mechanics, postural dysfunction, and pain.    ACTIVITY LIMITATIONS: carrying, lifting, bending, sitting, standing, squatting, sleeping, stairs, transfers, bed mobility, bathing, toileting, dressing, and locomotion level   PARTICIPATION LIMITATIONS: meal prep, cleaning, laundry, shopping, community activity, and yard work   PERSONAL FACTORS: Time since onset of injury/illness/exacerbation and 3+ comorbidities: CAD, PE, DVT, HTN  are also affecting patient's functional outcome.    REHAB POTENTIAL: Good   CLINICAL DECISION MAKING: Stable/uncomplicated   EVALUATION COMPLEXITY: Low     GOALS: Goals reviewed with patient? Yes   SHORT TERM GOALS: Target date: 02/25/2022   Pt will demonstrate appropriate understanding and performance of initially prescribed HEP in order to facilitate improved independence with management of symptoms.  Baseline: HEP provided on eval Goal status: INITIAL    2. Pt will score greater than or equal to 42% on FOTO in order to demonstrate improved perception of function due to symptoms.            Baseline: 32%            Goal status: INITIAL    LONG TERM GOALS: Target date: 03/25/2022              Pt will score 51% on FOTO in order to demonstrate improved perception of functional status due to symptoms.  Baseline: 32% Goal status: INITIAL   2.  Pt will demonstrate ability to ambulate at least 1040f with LRAD and no more than 2pt  increase in baseline pain on NPS in order to facilitate improved tolerance to community ambulation.  Baseline: pt unable to tolerate greater than household distances due to significant inc in pain Goal status: INITIAL   3.  Pt will demonstrate hip flexion MMT of 5/5 in order to facilitate improved clearance with gait for reduced fall risk.  Baseline: 4/5 B w pain Goal status: INITIAL   4. Pt will be able to perform TUG in less than or equal to 13 sec with LRAD in order to indicate reduced risk of falling (cutoff score for fall risk 13.5 sec in community dwelling older adults [facility], 2000)            Baseline: 22sec with rollator            Goal status: INITIAL    PLAN:   PT FREQUENCY: 1-2x/week   PT DURATION: 8 weeks   PLANNED INTERVENTIONS: Therapeutic exercises, Therapeutic activity, Neuromuscular re-education, Balance training, Gait training, Patient/Family education, Self Care, Stair training, DME instructions, Aquatic Therapy, Cryotherapy, Moist heat, Taping, Manual therapy, and Re-evaluation.   PLAN FOR NEXT SESSION: Progress functional mobility/strengthening exercises as able/appropriate, review HEP.    JHessie Diener PTA 02/04/22 2:10 PM Phone: [phone]: [phone]

## 2022-02-04 NOTE — Therapy (Incomplete)
OUTPATIENT PHYSICAL THERAPY TREATMENT NOTE   [name] MRN: [id number] DOB:[birth year], 56 y.o., male Today's Date: 02/04/2022  PCP: Holley Bouche, MD   REFERRING PROVIDER: Judith Part, MD  END OF SESSION:     Past Medical History:  Diagnosis Date   Acute pulmonary embolus ([place])    bilateral submassive PE in setting of missing several doses of Xarelto for his DVT   Arthritis    DVT (deep venous thrombosis) ([place]) 11/28/2013   RT LEG   High cholesterol    Hypertension    Lumbar spine scoliosis    Marijuana use    Sleep apnea    Past Surgical History:  Procedure Laterality Date   ABDOMINAL EXPOSURE N/A 01/14/2022   Procedure: ABDOMINAL EXPOSURE;  Surgeon: Marty Heck, MD;  Location: [place];  Service: Vascular;  Laterality: N/A;   ANTERIOR LUMBAR FUSION Left 01/14/2022   Procedure: Lumbar three-four Lumbar four-five Oblique Lumbar Interbody Fusion;  Surgeon: Judith Part, MD;  Location: [place];  Service: Neurosurgery;  Laterality: Left;   HERNIA REPAIR     7510 and 2585; umbilical hernia repair   KNEE SURGERY Bilateral    Left knee 1993, right knee 2004   TONSILLECTOMY     TOTAL KNEE ARTHROPLASTY Left 02/10/2017   Procedure: LEFT TOTAL KNEE ARTHROPLASTY;  Surgeon: Leandrew Koyanagi, MD;  Location: [place];  Service: Orthopedics;  Laterality: Left;   TOTAL KNEE ARTHROPLASTY Right 07/28/2017   Procedure: RIGHT TOTAL KNEE ARTHROPLASTY;  Surgeon: Leandrew Koyanagi, MD;  Location: [place];  Service: Orthopedics;  Laterality: Right;   TRANSFORAMINAL LUMBAR INTERBODY FUSION W/ MIS 1 LEVEL N/A 01/14/2022   Procedure: OPEN TRANSFORAMINAL LUMBAR INTERBODY FUSION  LUMBAR FIVE-SACRAL ONE ,LAMINECTOMIES AND POSTERIOR LATERAL INSTRUMENTATION FUSION  LUMBAR THREE TO FIVE SACRAL ONE;  Surgeon: Judith Part, MD;  Location: [place];  Service: Neurosurgery;  Laterality: N/A;   Patient Active Problem List   Diagnosis Date Noted   Scoliosis 01/14/2022    Snoring 09/22/2021   Insomnia due to mental condition 09/22/2021   At risk for mood disturbance 09/01/2021   Sleep disturbance 07/28/2021   Chronic bilateral low back pain without sciatica 07/25/2019   Neurogenic claudication 05/19/2019   Hyperlipidemia LDL goal <70 07/20/2018   History of pulmonary embolus (PE) 07/20/2017   Mild CAD 07/20/2017   S/p total knee replacement, bilateral 02/10/2017   Chronic anticoagulation 01/26/2017   Pulmonary embolism ([place]) 06/03/2016   Encounter for chronic pain management 04/06/2014   Tobacco abuse 12/05/2013   DVT (deep venous thrombosis) ([place]) 11/28/2013   HTN (hypertension) 05/04/2013   Lumbar back pain 05/04/2013   BMI 33.0-33.9,adult 05/04/2013   Hx of substance abuse ([place]) 05/04/2013    REFERRING DIAG:  M54.50 (ICD-10-CM) - Lumbar back pain  THERAPY DIAG:  No diagnosis found.  Rationale for Evaluation and Treatment Rehabilitation  PERTINENT HISTORY:  CAD, PE, DVT, HTN S/p lumbar interbody fusion 01/14/22  PRECAUTIONS: Back, abdominal and posterior incisions    SUBJECTIVE:  SUBJECTIVE STATEMENT:  I have been doing the exercises. I am sleeping in my chair. Trying to get a recliner. The bed is too low.      PAIN:  Are you having pain: yes, 7/10 Location: low back How would you describe your pain? Stiff, achey Best in past week: 5/10 with movement, no resting pain at best Worst in past week: 11/10 Aggravating factors: walking/standing household distances, prolonged sitting Easing factors: rest breaks, ice    OBJECTIVE: (objective measures completed at initial evaluation unless otherwise dated)    DIAGNOSTIC FINDINGS:  Post op interbody fusion lumbar   PATIENT SURVEYS:  FOTO 32%   COGNITION: Overall cognitive status: Within functional limits  for tasks assessed                          SENSATION: Light touch intact B LEs     POSTURE: increased kyphosis and forward head posture, UT elevation   PALPATION/OBSERVATION: Deferred as pt wears back brace to eval - states he saw MD this morning and no concerns; abdominal incision healed and posterior incision with mild drainage but WNL per pt report   LUMBAR ROM:    AROM eval  Flexion    Extension    Right lateral flexion    Left lateral flexion    Right rotation    Left rotation     (Blank rows = not tested) Comments: Deferred on eval given spine precautions     LOWER EXTREMITY MMT:     MMT Right eval Left eval  Hip flexion 4p! 4p!  Hip abduction (modified sitting) 4+ 4+  Hip internal rotation      Hip external rotation      Knee flexion 5 4+  Knee extension 4+ 4+   (Blank rows = not tested)   Comments: mild pulling in hamstring with knee ext on R, no pain with resistance; mild pain with hip flex B that resolves with cessation of movement       FUNCTIONAL TESTS:  Sit to stand transfer, standard chair: heavy use of UE on rollator and chair, reduced fwd weight shift, wide BOS, significantly inc time and effort   TUG: 22 seconds from standard chair w/ rollator, gait mechanics as below, heavy UE use    GAIT: Distance walked: within clinic Assistive device utilized: rollator Level of assistance: Modified independence Comments: significant forward flexed posture, partial step through pattern with intermittent step to, reduced hip extension B, reduced knee ROM throughout swing/stance   TODAY'S TREATMENT:   [facility] Adult PT Treatment:                                                DATE: 02-05-22 Therapeutic Exercise: *** Manual Therapy: *** Neuromuscular re-ed: *** Therapeutic Activity: *** Modalities: *** Self Care: ***  [facility] Adult PT  Treatment:                                                DATE: 02/04/22 Therapeutic Exercise: Nustep L 5 UE/LE x 5 minutes Mini squat at counter 10 x2 Heel raises at counter 10 x 2  Hooklying AROM clams Alternating heel slides in comfortable ROM Gluteal squeezes hookying Seated alternating LAQs  Seated scap squeezes Seated pelvic rocking in comfortable ROM  Seated calf stretch with strap  Seated hip hinging    [facility] Adult PT Treatment:                                                DATE: 01/28/22 Therapeutic Exercise: TA activation x10, seated, cues for HEP and breath control STS standard chair w/ rollator x5, cues for mechanics and pacing   PATIENT EDUCATION:  Education details: Pt education on PT impairments, prognosis, and POC. Informed consent. Rationale for interventions, safe/appropriate HEP performance Person educated: Patient Education method: Explanation, Demonstration, Tactile cues, Verbal cues, and Handouts Education comprehension: verbalized understanding, returned demonstration, verbal cues required, tactile cues required, and needs further education    HOME EXERCISE PROGRAM: Access Code: [email or web address] URL: [email or web address] Date: 01/28/2022 Prepared by: Enis Slipper   Exercises - Seated Transversus Abdominis Bracing  - 1 x daily - 7 x weekly - 3 sets - 10 reps - Sit to Stand with Armchair  - 1 x daily - 7 x weekly - 3 sets - 5 reps   ASSESSMENT:   CLINICAL IMPRESSION: Pt is a 56 year old gentleman who arrives to PT treatment on this date for back pain s/p lumbar interbody fusion on 01/14/22. Pt reports 7/10 pain and compliance with HEP. Able to begin Nustep and general beginner core and closed chain functional mobility. He has significant trunk flexion and low rollator, difficulty with trunk extension at counter. He was able to tolerate hooklying for short period of time to begin mobility and core activation.   He will benefit from continued  skilled  PT to address deficits to improve functional independence/tolerance.    OBJECTIVE IMPAIRMENTS: Abnormal gait, decreased activity tolerance, decreased balance, decreased endurance, decreased mobility, difficulty walking, decreased ROM, decreased strength, hypomobility, improper body mechanics, postural dysfunction, and pain.    ACTIVITY LIMITATIONS: carrying, lifting, bending, sitting, standing, squatting, sleeping, stairs, transfers, bed mobility, bathing, toileting, dressing, and locomotion level   PARTICIPATION LIMITATIONS: meal prep, cleaning, laundry, shopping, community activity, and yard work   PERSONAL FACTORS: Time since onset of injury/illness/exacerbation and 3+ comorbidities: CAD, PE, DVT, HTN  are also affecting patient's functional outcome.    REHAB POTENTIAL: Good   CLINICAL DECISION MAKING: Stable/uncomplicated   EVALUATION COMPLEXITY: Low     GOALS: Goals reviewed with patient? Yes   SHORT TERM GOALS: Target date: 02/25/2022   Pt will demonstrate appropriate understanding and performance of initially prescribed HEP in order to facilitate improved independence with management of symptoms.  Baseline: HEP provided on eval Goal status: INITIAL    2. Pt will score greater than or equal to 42% on FOTO in order to demonstrate improved perception of function due to symptoms.            Baseline: 32%  Goal status: INITIAL    LONG TERM GOALS: Target date: 03/25/2022              Pt will score 51% on FOTO in order to demonstrate improved perception of functional status due to symptoms.  Baseline: 32% Goal status: INITIAL   2.  Pt will demonstrate ability to ambulate at least 1068f with LRAD and no more than 2pt increase in baseline pain on NPS in order to facilitate improved tolerance to community ambulation.  Baseline: pt unable to tolerate greater than household distances due to significant inc in pain Goal status: INITIAL   3.  Pt will demonstrate hip  flexion MMT of 5/5 in order to facilitate improved clearance with gait for reduced fall risk.  Baseline: 4/5 B w pain Goal status: INITIAL   4. Pt will be able to perform TUG in less than or equal to 13 sec with LRAD in order to indicate reduced risk of falling (cutoff score for fall risk 13.5 sec in community dwelling older adults per [facility], 2000)            Baseline: 22sec with rollator            Goal status: INITIAL    PLAN:   PT FREQUENCY: 1-2x/week   PT DURATION: 8 weeks   PLANNED INTERVENTIONS: Therapeutic exercises, Therapeutic activity, Neuromuscular re-education, Balance training, Gait training, Patient/Family education, Self Care, Stair training, DME instructions, Aquatic Therapy, Cryotherapy, Moist heat, Taping, Manual therapy, and Re-evaluation.   PLAN FOR NEXT SESSION: Progress functional mobility/strengthening exercises as able/appropriate, review HEP.    ***

## 2022-02-05 ENCOUNTER — Ambulatory Visit: Payer: 59 | Admitting: Physical Therapy

## 2022-02-09 NOTE — Therapy (Signed)
OUTPATIENT PHYSICAL THERAPY TREATMENT NOTE   [name] MRN: [id number] DOB:06-15-1965, 56 y.o., male Today's Date: 02/10/2022  PCP: Holley Bouche, MD   REFERRING PROVIDER: Judith Part, MD  END OF SESSION:   PT End of Session - 02/10/22 0924     Visit Number 3    Date for PT Re-Evaluation 03/25/22    Authorization Type [employer]    Authorization Time Period auth after 25th visit, FOTO v6 and v10    Authorization - Visit Number 3    Authorization - Number of Visits 25    Progress Note Due on Visit 10    PT Start Time 0924    PT Stop Time 1011    PT Time Calculation (min) 47 min    Activity Tolerance Patient tolerated treatment well;No increased pain    Behavior During Therapy WFL for tasks assessed/performed              Past Medical History:  Diagnosis Date   Acute pulmonary embolus ([facility])    bilateral submassive PE in setting of missing several doses of Xarelto for his DVT   Arthritis    DVT (deep venous thrombosis) ([place]) 11/28/2013   RT LEG   High cholesterol    Hypertension    Lumbar spine scoliosis    Marijuana use    Sleep apnea    Past Surgical History:  Procedure Laterality Date   ABDOMINAL EXPOSURE N/A 01/14/2022   Procedure: ABDOMINAL EXPOSURE;  Surgeon: Marty Heck, MD;  Location: [place];  Service: Vascular;  Laterality: N/A;   ANTERIOR LUMBAR FUSION Left 01/14/2022   Procedure: Lumbar three-four Lumbar four-five Oblique Lumbar Interbody Fusion;  Surgeon: Judith Part, MD;  Location: [place];  Service: Neurosurgery;  Laterality: Left;   HERNIA REPAIR     7829 and 5621; umbilical hernia repair   KNEE SURGERY Bilateral    Left knee 1993, right knee 2004   TONSILLECTOMY     TOTAL KNEE ARTHROPLASTY Left 02/10/2017   Procedure: LEFT TOTAL KNEE ARTHROPLASTY;  Surgeon: Leandrew Koyanagi, MD;  Location: [place];  Service: Orthopedics;  Laterality: Left;   TOTAL KNEE ARTHROPLASTY Right 07/28/2017   Procedure: RIGHT TOTAL  KNEE ARTHROPLASTY;  Surgeon: Leandrew Koyanagi, MD;  Location: [place];  Service: Orthopedics;  Laterality: Right;   TRANSFORAMINAL LUMBAR INTERBODY FUSION W/ MIS 1 LEVEL N/A 01/14/2022   Procedure: OPEN TRANSFORAMINAL LUMBAR INTERBODY FUSION  LUMBAR FIVE-SACRAL ONE ,LAMINECTOMIES AND POSTERIOR LATERAL INSTRUMENTATION FUSION  LUMBAR THREE TO FIVE SACRAL ONE;  Surgeon: Judith Part, MD;  Location: [place];  Service: Neurosurgery;  Laterality: N/A;   Patient Active Problem List   Diagnosis Date Noted   Scoliosis 01/14/2022   Snoring 09/22/2021   Insomnia due to mental condition 09/22/2021   At risk for mood disturbance 09/01/2021   Sleep disturbance 07/28/2021   Chronic bilateral low back pain without sciatica 07/25/2019   Neurogenic claudication 05/19/2019   Hyperlipidemia LDL goal <70 07/20/2018   History of pulmonary embolus (PE) 07/20/2017   Mild CAD 07/20/2017   S/p total knee replacement, bilateral 02/10/2017   Chronic anticoagulation 01/26/2017   Pulmonary embolism ([place]) 06/03/2016   Encounter for chronic pain management 04/06/2014   Tobacco abuse 12/05/2013   DVT (deep venous thrombosis) ([place]) 11/28/2013   HTN (hypertension) 05/04/2013   Lumbar back pain 05/04/2013   BMI 33.0-33.9,adult 05/04/2013   Hx of substance abuse ([place]) 05/04/2013    REFERRING DIAG:  M54.50 (ICD-10-CM) -  Lumbar back pain  THERAPY DIAG:  Other low back pain  Other abnormalities of gait and mobility  Muscle weakness (generalized)  Rationale for Evaluation and Treatment Rehabilitation  PERTINENT HISTORY:  CAD, PE, DVT, HTN S/p lumbar interbody fusion 01/14/22  PRECAUTIONS: Back, abdominal and posterior incisions    SUBJECTIVE:                                                                                                                                                                                      SUBJECTIVE STATEMENT:  Pt arrives with moderate pain, states he has been doing well with  HEP and it seems to be helping with pain. Denies soreness or issues after last session.  Follows up with surgeon Dec 1st. Pt also mentions B LE swelling that he attributes to difficulty elevating his limbs, notes it is better when he is more active, states he plans to follow up with PCP about getting compression socks.    PAIN:  Are you having pain: yes, 6/10 Location: low back How would you describe your pain? Stiff, achey Best in past week: 5/10 with movement, no resting pain at best Worst in past week: 11/10 Aggravating factors: walking/standing household distances, prolonged sitting Easing factors: rest breaks, ice    OBJECTIVE: (objective measures completed at initial evaluation unless otherwise dated)    DIAGNOSTIC FINDINGS:  Post op interbody fusion lumbar   PATIENT SURVEYS:  FOTO 32%   COGNITION: Overall cognitive status: Within functional limits for tasks assessed                          SENSATION: Light touch intact B LEs     POSTURE: increased kyphosis and forward head posture, UT elevation   PALPATION/OBSERVATION: Deferred as pt wears back brace to eval - states he saw MD this morning and no concerns; abdominal incision healed and posterior incision with mild drainage but WNL per pt report   LUMBAR ROM:    AROM eval  Flexion    Extension    Right lateral flexion    Left lateral flexion    Right rotation    Left rotation     (Blank rows = not tested) Comments: Deferred on eval given spine precautions     LOWER EXTREMITY MMT:     MMT Right eval Left eval  Hip flexion 4p! 4p!  Hip abduction (modified sitting) 4+ 4+  Hip internal rotation      Hip external rotation      Knee flexion 5 4+  Knee extension 4+ 4+   (Blank rows = not tested)   Comments: mild pulling  in hamstring with knee ext on R, no pain with resistance; mild pain with hip flex B that resolves with cessation of movement       FUNCTIONAL TESTS:  Sit to stand transfer, standard  chair: heavy use of UE on rollator and chair, reduced fwd weight shift, wide BOS, significantly inc time and effort   TUG: 22 seconds from standard chair w/ rollator, gait mechanics as below, heavy UE use    GAIT: Distance walked: within clinic Assistive device utilized: rollator Level of assistance: Modified independence Comments: significant forward flexed posture, partial step through pattern with intermittent step to, reduced hip extension B, reduced knee ROM throughout swing/stance   TODAY'S TREATMENT:                                                                                                   [facility] Adult PT Treatment:                                                DATE: 02/10/22 Therapeutic Exercise: Nu step L5 UE/LE during subjective Adductor isos, seated on low mat + 2 airex 2x12 w 5 sec hold, cues for posture and breath control Seated swiss ball press down, 2x12 cues for breath control/avoiding valsalva, posture Alternating LAQ x55mn Mini squat 2x10 at counter, cues for truncal upright Heel raises 2x12 at counter Sidestepping at counter 2x3 laps gentle UE support as needed (weaned to fingertips only)                      [facility] Adult PT Treatment:                                                DATE: 02/04/22 Therapeutic Exercise: Nustep L 5 UE/LE x 5 minutes Mini squat at counter 10 x2 Heel raises at counter 10 x 2  Hooklying AROM clams Alternating heel slides in comfortable ROM Gluteal squeezes hookying Seated alternating LAQs  Seated scap squeezes Seated pelvic rocking in comfortable ROM  Seated calf stretch with strap  Seated hip hinging    [facility] Adult PT Treatment:                                                DATE: 01/28/22 Therapeutic Exercise: TA activation x10, seated, cues for HEP and breath control STS standard chair w/ rollator x5, cues for mechanics and pacing   PATIENT EDUCATION:  Education details: rationale for interventions Person educated:  Patient Education method: Explanation, Demonstration, Tactile cues, Verbal cues Education comprehension: verbalized understanding, returned demonstration, verbal cues required, tactile cues required, and needs further education    HOME EXERCISE PROGRAM: Access Code: [email or web address] URL: [email or web address]  Date: 01/28/2022 Prepared by: Enis Slipper   Exercises - Seated Transversus Abdominis Bracing  - 1 x daily - 7 x weekly - 3 sets - 10 reps - Sit to Stand with Armchair  - 1 x daily - 7 x weekly - 3 sets - 5 reps   ASSESSMENT:   CLINICAL IMPRESSION: Pt arrives with 6/10 pain, denies significant soreness or issues since last session. Continues to demonstrate significantly flexed posture with standing/ambulation, today's interventions focusing on improved extension to neutral with functional movements, continued with familiar program. Increased time spent in WB today although did require increased rest breaks due to fatigue. Given report of B LE swelling at home in dependent position that improves with activity, education provided on LAQ/AP program while sitting.  Pt tolerates session well without increase in pain, no adverse events, improvement in pain to 4/10 post exercise. Pt departs today's session in no acute distress, all voiced questions/concerns addressed appropriately from PT perspective.      OBJECTIVE IMPAIRMENTS: Abnormal gait, decreased activity tolerance, decreased balance, decreased endurance, decreased mobility, difficulty walking, decreased ROM, decreased strength, hypomobility, improper body mechanics, postural dysfunction, and pain.    ACTIVITY LIMITATIONS: carrying, lifting, bending, sitting, standing, squatting, sleeping, stairs, transfers, bed mobility, bathing, toileting, dressing, and locomotion level   PARTICIPATION LIMITATIONS: meal prep, cleaning, laundry, shopping, community activity, and yard work   PERSONAL FACTORS: Time since onset of  injury/illness/exacerbation and 3+ comorbidities: CAD, PE, DVT, HTN  are also affecting patient's functional outcome.    REHAB POTENTIAL: Good   CLINICAL DECISION MAKING: Stable/uncomplicated   EVALUATION COMPLEXITY: Low     GOALS: Goals reviewed with patient? Yes   SHORT TERM GOALS: Target date: 02/25/2022   Pt will demonstrate appropriate understanding and performance of initially prescribed HEP in order to facilitate improved independence with management of symptoms.  Baseline: HEP provided on eval Goal status: INITIAL    2. Pt will score greater than or equal to 42% on FOTO in order to demonstrate improved perception of function due to symptoms.            Baseline: 32%            Goal status: INITIAL    LONG TERM GOALS: Target date: 03/25/2022              Pt will score 51% on FOTO in order to demonstrate improved perception of functional status due to symptoms.  Baseline: 32% Goal status: INITIAL   2.  Pt will demonstrate ability to ambulate at least 1019f with LRAD and no more than 2pt increase in baseline pain on NPS in order to facilitate improved tolerance to community ambulation.  Baseline: pt unable to tolerate greater than household distances due to significant inc in pain Goal status: INITIAL   3.  Pt will demonstrate hip flexion MMT of 5/5 in order to facilitate improved clearance with gait for reduced fall risk.  Baseline: 4/5 B w pain Goal status: INITIAL   4. Pt will be able to perform TUG in less than or equal to 13 sec with LRAD in order to indicate reduced risk of falling (cutoff score for fall risk 13.5 sec in community dwelling older adults per [facility], 2000)            Baseline: 22sec with rollator            Goal status: INITIAL    PLAN:   PT FREQUENCY: 1-2x/week   PT DURATION: 8 weeks  PLANNED INTERVENTIONS: Therapeutic exercises, Therapeutic activity, Neuromuscular re-education, Balance training, Gait training, Patient/Family  education, Self Care, Stair training, DME instructions, Aquatic Therapy, Cryotherapy, Moist heat, Taping, Manual therapy, and Re-evaluation.   PLAN FOR NEXT SESSION:  Progress functional mobility/strengthening exercises as able/appropriate, review/update HEP.    Leeroy Cha PT, DPT 02/10/2022 10:12 AM

## 2022-02-10 ENCOUNTER — Ambulatory Visit: Payer: 59 | Admitting: Physical Therapy

## 2022-02-10 ENCOUNTER — Encounter: Payer: Self-pay | Admitting: Physical Therapy

## 2022-02-10 DIAGNOSIS — M6281 Muscle weakness (generalized): Secondary | ICD-10-CM

## 2022-02-10 DIAGNOSIS — M5459 Other low back pain: Secondary | ICD-10-CM

## 2022-02-10 DIAGNOSIS — R2689 Other abnormalities of gait and mobility: Secondary | ICD-10-CM | POA: Diagnosis not present

## 2022-02-10 DIAGNOSIS — M545 Low back pain, unspecified: Secondary | ICD-10-CM | POA: Diagnosis not present

## 2022-02-11 ENCOUNTER — Telehealth: Payer: Self-pay

## 2022-02-11 NOTE — Telephone Encounter (Signed)
Patient calls nurse line regarding bilateral swelling in feet and ankles.   He reports that he recently had back surgery on 10/28. Reports slight redness bilaterally, denies warmth. He states that he believes the redness is from scratching at the area.   Denies chest pain or SHOB.   Patient states that he informed surgeon who recommended reaching out to our office to order compression socks.   Will forward request to PCP.   Red flags discussed with patient.   Talbot Grumbling, RN

## 2022-02-13 NOTE — Therapy (Signed)
OUTPATIENT PHYSICAL THERAPY TREATMENT NOTE   [name] MRN: [id number] DOB:[birth year], 56 y.o., male Today's Date: 02/16/2022  PCP: Holley Bouche, MD   REFERRING PROVIDER: Judith Part, MD  END OF SESSION:   PT End of Session - 02/16/22 0911     Visit Number 4    Number of Visits 17    Date for PT Re-Evaluation 03/25/22    Authorization Type [employer]    Authorization Time Period auth after 25th visit, FOTO v6 and v10    Authorization - Visit Number 4    Authorization - Number of Visits 25    PT Start Time 0915    PT Stop Time 0958    PT Time Calculation (min) 43 min    Activity Tolerance Patient tolerated treatment well;No increased pain    Behavior During Therapy WFL for tasks assessed/performed               Past Medical History:  Diagnosis Date   Acute pulmonary embolus (HCC)    bilateral submassive PE in setting of missing several doses of Xarelto for his DVT   Arthritis    DVT (deep venous thrombosis) ([place]) 11/28/2013   RT LEG   High cholesterol    Hypertension    Lumbar spine scoliosis    Marijuana use    Sleep apnea    Past Surgical History:  Procedure Laterality Date   ABDOMINAL EXPOSURE N/A 01/14/2022   Procedure: ABDOMINAL EXPOSURE;  Surgeon: Marty Heck, MD;  Location: [place];  Service: Vascular;  Laterality: N/A;   ANTERIOR LUMBAR FUSION Left 01/14/2022   Procedure: Lumbar three-four Lumbar four-five Oblique Lumbar Interbody Fusion;  Surgeon: Judith Part, MD;  Location: [place];  Service: Neurosurgery;  Laterality: Left;   HERNIA REPAIR     9390 and 3009; umbilical hernia repair   KNEE SURGERY Bilateral    Left knee 1993, right knee 2004   TONSILLECTOMY     TOTAL KNEE ARTHROPLASTY Left 02/10/2017   Procedure: LEFT TOTAL KNEE ARTHROPLASTY;  Surgeon: Leandrew Koyanagi, MD;  Location: [place];  Service: Orthopedics;  Laterality: Left;   TOTAL KNEE ARTHROPLASTY Right 07/28/2017   Procedure: RIGHT TOTAL KNEE  ARTHROPLASTY;  Surgeon: Leandrew Koyanagi, MD;  Location: [place];  Service: Orthopedics;  Laterality: Right;   TRANSFORAMINAL LUMBAR INTERBODY FUSION W/ MIS 1 LEVEL N/A 01/14/2022   Procedure: OPEN TRANSFORAMINAL LUMBAR INTERBODY FUSION  LUMBAR FIVE-SACRAL ONE ,LAMINECTOMIES AND POSTERIOR LATERAL INSTRUMENTATION FUSION  LUMBAR THREE TO FIVE SACRAL ONE;  Surgeon: Judith Part, MD;  Location: [place];  Service: Neurosurgery;  Laterality: N/A;   Patient Active Problem List   Diagnosis Date Noted   Scoliosis 01/14/2022   Snoring 09/22/2021   Insomnia due to mental condition 09/22/2021   At risk for mood disturbance 09/01/2021   Sleep disturbance 07/28/2021   Chronic bilateral low back pain without sciatica 07/25/2019   Neurogenic claudication 05/19/2019   Hyperlipidemia LDL goal <70 07/20/2018   History of pulmonary embolus (PE) 07/20/2017   Mild CAD 07/20/2017   S/p total knee replacement, bilateral 02/10/2017   Chronic anticoagulation 01/26/2017   Pulmonary embolism ([place]) 06/03/2016   Encounter for chronic pain management 04/06/2014   Tobacco abuse 12/05/2013   DVT (deep venous thrombosis) ([place]) 11/28/2013   HTN (hypertension) 05/04/2013   Lumbar back pain 05/04/2013   BMI 33.0-33.9,adult 05/04/2013   Hx of substance abuse ([place]) 05/04/2013    REFERRING DIAG:  M54.50 (ICD-10-CM) -  Lumbar back pain  THERAPY DIAG:  Other low back pain  Other abnormalities of gait and mobility  Muscle weakness (generalized)  Rationale for Evaluation and Treatment Rehabilitation  PERTINENT HISTORY:  CAD, PE, DVT, HTN S/p lumbar interbody fusion 01/14/22  PRECAUTIONS: Back, abdominal and posterior incisions    SUBJECTIVE:                                                                                                                                                                                      SUBJECTIVE STATEMENT:  Pt arrives with moderate pain, states he had trouble sleeping last  night. No changes in regards to  B LE swelling, states he spoke with his MD about it and they will be ordering compression socks. Denies significant soreness after last session. No other new updates     PAIN:  Are you having pain: yes, 5/10 Location: low back How would you describe your pain? Stiff, achey Best in past week: 5/10 with movement, no resting pain at best Worst in past week: 11/10 Aggravating factors: walking/standing household distances, prolonged sitting Easing factors: rest breaks, ice    OBJECTIVE: (objective measures completed at initial evaluation unless otherwise dated)    DIAGNOSTIC FINDINGS:  Post op interbody fusion lumbar   PATIENT SURVEYS:  FOTO 32%   COGNITION: Overall cognitive status: Within functional limits for tasks assessed                          SENSATION: Light touch intact B LEs     POSTURE: increased kyphosis and forward head posture, UT elevation   PALPATION/OBSERVATION: Deferred as pt wears back brace to eval - states he saw MD this morning and no concerns; abdominal incision healed and posterior incision with mild drainage but WNL per pt report   LUMBAR ROM:    AROM eval  Flexion    Extension    Right lateral flexion    Left lateral flexion    Right rotation    Left rotation     (Blank rows = not tested) Comments: Deferred on eval given spine precautions     LOWER EXTREMITY MMT:     MMT Right eval Left eval  Hip flexion 4p! 4p!  Hip abduction (modified sitting) 4+ 4+  Hip internal rotation      Hip external rotation      Knee flexion 5 4+  Knee extension 4+ 4+   (Blank rows = not tested)   Comments: mild pulling in hamstring with knee ext on R, no pain with resistance; mild pain with hip flex B that resolves with cessation of movement  FUNCTIONAL TESTS:  Sit to stand transfer, standard chair: heavy use of UE on rollator and chair, reduced fwd weight shift, wide BOS, significantly inc time and effort    TUG: 22 seconds from standard chair w/ rollator, gait mechanics as below, heavy UE use  02/16/22: TUG 12sec standard chair with rollator, continues with impaired gait mechanics and heavy UE use, fwd flexed posture    GAIT: Distance walked: within clinic Assistive device utilized: rollator Level of assistance: Modified independence Comments: significant forward flexed posture, partial step through pattern with intermittent step to, reduced hip extension B, reduced knee ROM throughout swing/stance   TODAY'S TREATMENT:                                                                                                    [facility] Adult PT Treatment:                                                DATE: 02/16/22 Therapeutic Exercise: Nu step L5 LE/UE during subjective, 33mn TUG Adductor iso 2x15, cues for pacing Seated swiss ball press down iso, 2x15 cues for breath control and posture Standing heel raises 2x12 // bars, cues for trunk posture  Standing hip extension //bars x8 each LE cues for trunk posture Standing hip abd // bars x8 each LE, cues for reduced lean Standing marches // x8 each LE Mini squat // bars x10   [facility] Adult PT Treatment:                                                DATE: 02/10/22 Therapeutic Exercise: Nu step L5 UE/LE during subjective Adductor isos, seated on low mat + 2 airex 2x12 w 5 sec hold, cues for posture and breath control Seated swiss ball press down, 2x12 cues for breath control/avoiding valsalva, posture Alternating LAQ x243m Mini squat 2x10 at counter, cues for truncal upright Heel raises 2x12 at counter Sidestepping at counter 2x3 laps gentle UE support as needed (weaned to fingertips only)                      [facility] Adult PT Treatment:                                                DATE: 02/04/22 Therapeutic Exercise: Nustep L 5 UE/LE x 5 minutes Mini squat at counter 10 x2 Heel raises at counter 10 x 2  Hooklying AROM clams Alternating heel slides  in comfortable ROM Gluteal squeezes hookying Seated alternating LAQs  Seated scap squeezes Seated pelvic rocking in comfortable ROM  Seated calf stretch with strap  Seated hip hinging     PATIENT EDUCATION:  Education details: rationale for interventions, monitoring swelling and red flag symptoms, HEP update Person educated: Patient Education method: Explanation, Demonstration, Tactile cues, Verbal cues Education comprehension: verbalized understanding, returned demonstration, verbal cues required, tactile cues required, and needs further education    HOME EXERCISE PROGRAM: Access Code: [email or web address] URL: [email or web address] Date: 02/16/2022 Prepared by: Enis Slipper  Exercises - Seated Transversus Abdominis Bracing  - 1 x daily - 7 x weekly - 3 sets - 10 reps - Sit to Stand with Armchair  - 1 x daily - 7 x weekly - 3 sets - 5 reps - Standing March with Counter Support  - 1 x daily - 7 x weekly - 1 sets - 8 reps - Standing Hip Extension with Counter Support  - 1 x daily - 7 x weekly - 1 sets - 8 reps   ASSESSMENT:   CLINICAL IMPRESSION: Pt arrives with report of 5/10 pain, denies significant soreness or issues after last session. Pt continues to demonstrate flexed posture with functional mobility. TUG score improved to 12sec from 22sec on initial eval although mechanics remain altered. Pt tolerates today's session well with intermittent rest breaks, much of session performed in parallel bars to emphasize postural upright. Education on HEP update. Denies increase in pain as session goes on, reports improved stiffness. No adverse events. Pt departs today's session in no acute distress, all voiced questions/concerns addressed appropriately from PT perspective.       OBJECTIVE IMPAIRMENTS: Abnormal gait, decreased activity tolerance, decreased balance, decreased endurance, decreased mobility, difficulty walking, decreased ROM, decreased strength, hypomobility, improper body  mechanics, postural dysfunction, and pain.    ACTIVITY LIMITATIONS: carrying, lifting, bending, sitting, standing, squatting, sleeping, stairs, transfers, bed mobility, bathing, toileting, dressing, and locomotion level   PARTICIPATION LIMITATIONS: meal prep, cleaning, laundry, shopping, community activity, and yard work   PERSONAL FACTORS: Time since onset of injury/illness/exacerbation and 3+ comorbidities: CAD, PE, DVT, HTN  are also affecting patient's functional outcome.    REHAB POTENTIAL: Good   CLINICAL DECISION MAKING: Stable/uncomplicated   EVALUATION COMPLEXITY: Low     GOALS: Goals reviewed with patient? Yes   SHORT TERM GOALS: Target date: 02/25/2022   Pt will demonstrate appropriate understanding and performance of initially prescribed HEP in order to facilitate improved independence with management of symptoms.  Baseline: HEP provided on eval Goal status: INITIAL    2. Pt will score greater than or equal to 42% on FOTO in order to demonstrate improved perception of function due to symptoms.            Baseline: 32%            Goal status: INITIAL    LONG TERM GOALS: Target date: 03/25/2022              Pt will score 51% on FOTO in order to demonstrate improved perception of functional status due to symptoms.  Baseline: 32% Goal status: INITIAL   2.  Pt will demonstrate ability to ambulate at least 1039f with LRAD and no more than 2pt increase in baseline pain on NPS in order to facilitate improved tolerance to community ambulation.  Baseline: pt unable to tolerate greater than household distances due to significant inc in pain Goal status: INITIAL   3.  Pt will demonstrate hip flexion MMT of 5/5 in order to facilitate improved clearance with gait for reduced fall risk.  Baseline: 4/5 B w pain Goal status: INITIAL   4. Pt will be able to perform TUG in less  than or equal to 13 sec with LRAD in order to indicate reduced risk of falling (cutoff score for fall  risk 13.5 sec in community dwelling older adults per [facility] et al, 2000)            Baseline: 22sec with rollator  02/16/22: 12sec with rollator             Goal status: MET    PLAN:   PT FREQUENCY: 1-2x/week   PT DURATION: 8 weeks   PLANNED INTERVENTIONS: Therapeutic exercises, Therapeutic activity, Neuromuscular re-education, Balance training, Gait training, Patient/Family education, Self Care, Stair training, DME instructions, Aquatic Therapy, Cryotherapy, Moist heat, Taping, Manual therapy, and Re-evaluation.   PLAN FOR NEXT SESSION:  Progress functional mobility/strengthening exercises as able/appropriate, review/update HEP.    Leeroy Cha PT, DPT 02/16/2022 9:59 AM

## 2022-02-16 ENCOUNTER — Ambulatory Visit: Payer: 59 | Admitting: Physical Therapy

## 2022-02-16 ENCOUNTER — Encounter: Payer: Self-pay | Admitting: Physical Therapy

## 2022-02-16 DIAGNOSIS — M5459 Other low back pain: Secondary | ICD-10-CM

## 2022-02-16 DIAGNOSIS — R2689 Other abnormalities of gait and mobility: Secondary | ICD-10-CM

## 2022-02-16 DIAGNOSIS — M6281 Muscle weakness (generalized): Secondary | ICD-10-CM

## 2022-02-16 DIAGNOSIS — M545 Low back pain, unspecified: Secondary | ICD-10-CM | POA: Diagnosis not present

## 2022-02-17 NOTE — Telephone Encounter (Signed)
Patient returns call to nurse line regarding compressions socks. He states that swelling is still persistent and that he is noticing tingling and slight redness in feet now.   Offered appointment this afternoon. Patient does not have transportation today. Scheduled for tomorrow morning with Dr. Erin Hearing.   For compression socks prescription, please indicate the compression level. Discussed with patient that it would be best to leave with paper rx tomorrow, so he can take to whichever supplier he chooses.   Talbot Grumbling, RN

## 2022-02-18 ENCOUNTER — Ambulatory Visit: Payer: 59 | Admitting: Family Medicine

## 2022-02-18 NOTE — Patient Instructions (Incomplete)
Good to see you today - Thank you for coming in  Things we discussed today:  DIAGNOSIS : ______________________________________________ ICD9 : ___________________ DATE: ____________________      STYLE:  PHYSICIAN SIGNATURE: ____________________________      DATE: _______________________        Please always bring your medication bottles  Come back to see me in ***

## 2022-02-18 NOTE — Therapy (Signed)
OUTPATIENT PHYSICAL THERAPY TREATMENT NOTE   [name] MRN: [id number] DOB:[birth year], 56 y.o., male Today's Date: 02/23/2022  PCP: Holley Bouche, MD   REFERRING PROVIDER: Judith Part, MD  END OF SESSION:   PT End of Session - 02/23/22 0928     Visit Number 5    Number of Visits 17    Date for PT Re-Evaluation 03/25/22    Authorization Type [employer]    Authorization Time Period auth after 25th visit, FOTO v6 and v10    Authorization - Visit Number 5    Authorization - Number of Visits 25    PT Start Time [phone]    PT Stop Time 1011    PT Time Calculation (min) 42 min    Activity Tolerance Patient tolerated treatment well;No increased pain    Behavior During Therapy WFL for tasks assessed/performed                Past Medical History:  Diagnosis Date   Acute pulmonary embolus (HCC)    bilateral submassive PE in setting of missing several doses of Xarelto for his DVT   Arthritis    DVT (deep venous thrombosis) ([place]) 11/28/2013   RT LEG   High cholesterol    Hypertension    Lumbar spine scoliosis    Marijuana use    Sleep apnea    Past Surgical History:  Procedure Laterality Date   ABDOMINAL EXPOSURE N/A 01/14/2022   Procedure: ABDOMINAL EXPOSURE;  Surgeon: Marty Heck, MD;  Location: [facility];  Service: Vascular;  Laterality: N/A;   ANTERIOR LUMBAR FUSION Left 01/14/2022   Procedure: Lumbar three-four Lumbar four-five Oblique Lumbar Interbody Fusion;  Surgeon: Judith Part, MD;  Location: [facility];  Service: Neurosurgery;  Laterality: Left;   HERNIA REPAIR     2774 and 1287; umbilical hernia repair   KNEE SURGERY Bilateral    Left knee 1993, right knee 2004   TONSILLECTOMY     TOTAL KNEE ARTHROPLASTY Left 02/10/2017   Procedure: LEFT TOTAL KNEE ARTHROPLASTY;  Surgeon: Leandrew Koyanagi, MD;  Location: [facility];  Service: Orthopedics;  Laterality: Left;   TOTAL KNEE ARTHROPLASTY Right 07/28/2017   Procedure: RIGHT TOTAL KNEE  ARTHROPLASTY;  Surgeon: Leandrew Koyanagi, MD;  Location: [facility];  Service: Orthopedics;  Laterality: Right;   TRANSFORAMINAL LUMBAR INTERBODY FUSION W/ MIS 1 LEVEL N/A 01/14/2022   Procedure: OPEN TRANSFORAMINAL LUMBAR INTERBODY FUSION  LUMBAR FIVE-SACRAL ONE ,LAMINECTOMIES AND POSTERIOR LATERAL INSTRUMENTATION FUSION  LUMBAR THREE TO FIVE SACRAL ONE;  Surgeon: Judith Part, MD;  Location: [facility];  Service: Neurosurgery;  Laterality: N/A;   Patient Active Problem List   Diagnosis Date Noted   Scoliosis 01/14/2022   Snoring 09/22/2021   Insomnia due to mental condition 09/22/2021   At risk for mood disturbance 09/01/2021   Sleep disturbance 07/28/2021   Chronic bilateral low back pain without sciatica 07/25/2019   Neurogenic claudication 05/19/2019   Hyperlipidemia LDL goal <70 07/20/2018   History of pulmonary embolus (PE) 07/20/2017   Mild CAD 07/20/2017   S/p total knee replacement, bilateral 02/10/2017   Chronic anticoagulation 01/26/2017   Pulmonary embolism ([place]) 06/03/2016   Encounter for chronic pain management 04/06/2014   Tobacco abuse 12/05/2013   DVT (deep venous thrombosis) ([place]) 11/28/2013   HTN (hypertension) 05/04/2013   Lumbar back pain 05/04/2013   BMI 33.0-33.9,adult 05/04/2013   Hx of substance abuse ([place]) 05/04/2013    REFERRING DIAG:  M54.50 (ICD-10-CM) -  Lumbar back pain  THERAPY DIAG:  Other low back pain  Other abnormalities of gait and mobility  Muscle weakness (generalized)  Rationale for Evaluation and Treatment Rehabilitation  PERTINENT HISTORY:  CAD, PE, DVT, HTN S/p lumbar interbody fusion 01/14/22  PRECAUTIONS: Back, abdominal and posterior incisions    SUBJECTIVE:                                                                                                                                                                                      SUBJECTIVE STATEMENT:  Pt states he felt great after last session, no new complaints. Reports  good compliance with HEP. States BLE swelling about the same, no other new symptoms, states he is seeing his PCP later today about it.     PAIN:  Are you having pain: yes, 4/10 Location: low back How would you describe your pain? Stiff, achey Best in past week: 5/10 with movement, no resting pain at best Worst in past week: 11/10 Aggravating factors: walking/standing household distances, prolonged sitting Easing factors: rest breaks, ice    OBJECTIVE: (objective measures completed at initial evaluation unless otherwise dated)    DIAGNOSTIC FINDINGS:  Post op interbody fusion lumbar   PATIENT SURVEYS:  FOTO 32%   COGNITION: Overall cognitive status: Within functional limits for tasks assessed                          SENSATION: Light touch intact B LEs     POSTURE: increased kyphosis and forward head posture, UT elevation   PALPATION/OBSERVATION: Deferred as pt wears back brace to eval - states he saw MD this morning and no concerns; abdominal incision healed and posterior incision with mild drainage but WNL per pt report   LUMBAR ROM:    AROM eval  Flexion    Extension    Right lateral flexion    Left lateral flexion    Right rotation    Left rotation     (Blank rows = not tested) Comments: Deferred on eval given spine precautions     LOWER EXTREMITY MMT:     MMT Right eval Left eval  Hip flexion 4p! 4p!  Hip abduction (modified sitting) 4+ 4+  Hip internal rotation      Hip external rotation      Knee flexion 5 4+  Knee extension 4+ 4+   (Blank rows = not tested)   Comments: mild pulling in hamstring with knee ext on R, no pain with resistance; mild pain with hip flex B that resolves with cessation of movement       FUNCTIONAL TESTS:  Sit  to stand transfer, standard chair: heavy use of UE on rollator and chair, reduced fwd weight shift, wide BOS, significantly inc time and effort   TUG: 22 seconds from standard chair w/ rollator, gait mechanics as  below, heavy UE use  02/16/22: TUG 12sec standard chair with rollator, continues with impaired gait mechanics and heavy UE use, fwd flexed posture    GAIT: Distance walked: within clinic Assistive device utilized: rollator Level of assistance: Modified independence Comments: significant forward flexed posture, partial step through pattern with intermittent step to, reduced hip extension B, reduced knee ROM throughout swing/stance   TODAY'S TREATMENT:                                                                                                    [facility] Adult PT Treatment:                                                DATE: 02/23/22 Therapeutic Exercise: Nustep L5 52mn LE/UE during subjective Adductor iso x20 seated Standing marches 2x8 each LE in // bars, cues for form and pacing  Standing hip ext // bars 2x8 each LE, cues for upright posture and hip squeeze Standing heel raise // bars 2x12 cues for posture and pacing  Fwd/retro walking in // bars 3 laps, cues for upright posture and mechanics Mini squat // bars x12 cues for form  [facility] Adult PT Treatment:                                                DATE: 02/16/22 Therapeutic Exercise: Nu step L5 LE/UE during subjective, 538m TUG Adductor iso 2x15, cues for pacing Seated swiss ball press down iso, 2x15 cues for breath control and posture Standing heel raises 2x12 // bars, cues for trunk posture  Standing hip extension //bars x8 each LE cues for trunk posture Standing hip abd // bars x8 each LE, cues for reduced lean Standing marches // x8 each LE Mini squat // bars x10   [facility] Adult PT Treatment:                                                DATE: 02/10/22 Therapeutic Exercise: Nu step L5 UE/LE during subjective Adductor isos, seated on low mat + 2 airex 2x12 w 5 sec hold, cues for posture and breath control Seated swiss ball press down, 2x12 cues for breath control/avoiding valsalva, posture Alternating LAQ x2m9mMini squat  2x10 at counter, cues for truncal upright Heel raises 2x12 at counter Sidestepping at counter 2x3 laps gentle UE support as needed (weaned to fingertips only)            PATIENT EDUCATION:  Education details: rationale for interventions Person educated: Patient Education method: Explanation, Demonstration, Tactile cues, Verbal cues Education comprehension: verbalized understanding, returned demonstration, verbal cues required, tactile cues required, and needs further education    HOME EXERCISE PROGRAM: Access Code: [email or web address] URL: [email or web address] Date: 02/16/2022 Prepared by: Enis Slipper  Exercises - Seated Transversus Abdominis Bracing  - 1 x daily - 7 x weekly - 3 sets - 10 reps - Sit to Stand with Armchair  - 1 x daily - 7 x weekly - 3 sets - 5 reps - Standing March with Counter Support  - 1 x daily - 7 x weekly - 1 sets - 8 reps - Standing Hip Extension with Counter Support  - 1 x daily - 7 x weekly - 1 sets - 8 reps   ASSESSMENT:   CLINICAL IMPRESSION: Pt arrives w/ 4/10 pain, BLE about the same, states he is seeing PCP today for compression socks. Today's session primarily emphasizing increased time in WB and increased volume with familiar program, focus on improving upright posture with movements. Tolerates well w/o no adverse events, pain at 5/10 with exercise but returns to 4/10 prior to departure. Pt departs today's session in no acute distress, all voiced questions/concerns addressed appropriately from PT perspective.         OBJECTIVE IMPAIRMENTS: Abnormal gait, decreased activity tolerance, decreased balance, decreased endurance, decreased mobility, difficulty walking, decreased ROM, decreased strength, hypomobility, improper body mechanics, postural dysfunction, and pain.    ACTIVITY LIMITATIONS: carrying, lifting, bending, sitting, standing, squatting, sleeping, stairs, transfers, bed mobility, bathing, toileting, dressing, and locomotion level    PARTICIPATION LIMITATIONS: meal prep, cleaning, laundry, shopping, community activity, and yard work   PERSONAL FACTORS: Time since onset of injury/illness/exacerbation and 3+ comorbidities: CAD, PE, DVT, HTN  are also affecting patient's functional outcome.    REHAB POTENTIAL: Good   CLINICAL DECISION MAKING: Stable/uncomplicated   EVALUATION COMPLEXITY: Low     GOALS: Goals reviewed with patient? Yes   SHORT TERM GOALS: Target date: 02/25/2022   Pt will demonstrate appropriate understanding and performance of initially prescribed HEP in order to facilitate improved independence with management of symptoms.  Baseline: HEP provided on eval Goal status: INITIAL    2. Pt will score greater than or equal to 42% on FOTO in order to demonstrate improved perception of function due to symptoms.            Baseline: 32%            Goal status: INITIAL    LONG TERM GOALS: Target date: 03/25/2022              Pt will score 51% on FOTO in order to demonstrate improved perception of functional status due to symptoms.  Baseline: 32% Goal status: INITIAL   2.  Pt will demonstrate ability to ambulate at least 1054f with LRAD and no more than 2pt increase in baseline pain on NPS in order to facilitate improved tolerance to community ambulation.  Baseline: pt unable to tolerate greater than household distances due to significant inc in pain Goal status: INITIAL   3.  Pt will demonstrate hip flexion MMT of 5/5 in order to facilitate improved clearance with gait for reduced fall risk.  Baseline: 4/5 B w pain Goal status: INITIAL   4. Pt will be able to perform TUG in less than or equal to 13 sec with LRAD in order to indicate reduced risk of falling (cutoff score for fall risk 13.5 sec in community  dwelling older adults per [facility] et al, 2000)            Baseline: 22sec with rollator  02/16/22: 12sec with rollator             Goal status: MET    PLAN:   PT FREQUENCY: 1-2x/week    PT DURATION: 8 weeks   PLANNED INTERVENTIONS: Therapeutic exercises, Therapeutic activity, Neuromuscular re-education, Balance training, Gait training, Patient/Family education, Self Care, Stair training, DME instructions, Aquatic Therapy, Cryotherapy, Moist heat, Taping, Manual therapy, and Re-evaluation.   PLAN FOR NEXT SESSION:   Progress functional mobility/strengthening exercises as able/appropriate, review/update HEP.    Leeroy Cha PT, DPT 02/23/2022 10:12 AM

## 2022-02-18 NOTE — Progress Notes (Deleted)
    SUBJECTIVE:   CHIEF COMPLAINT / HPI:   ***  PERTINENT  PMH / PSH: *** Patient Active Problem List   Diagnosis Date Noted   Scoliosis 01/14/2022   Snoring 09/22/2021   Insomnia due to mental condition 09/22/2021   At risk for mood disturbance 09/01/2021   Sleep disturbance 07/28/2021   Chronic bilateral low back pain without sciatica 07/25/2019   Neurogenic claudication 05/19/2019   Hyperlipidemia LDL goal <70 07/20/2018   History of pulmonary embolus (PE) 07/20/2017   Mild CAD 07/20/2017   S/p total knee replacement, bilateral 02/10/2017   Chronic anticoagulation 01/26/2017   Pulmonary embolism ([place]) 06/03/2016   Encounter for chronic pain management 04/06/2014   Tobacco abuse 12/05/2013   DVT (deep venous thrombosis) ([place]) 11/28/2013   HTN (hypertension) 05/04/2013   Lumbar back pain 05/04/2013   BMI 33.0-33.9,adult 05/04/2013   Hx of substance abuse ([place]) 05/04/2013    Current Outpatient Medications  Medication Instructions   amLODipine (NORVASC) 10 mg, Oral, Daily, Please make an appointment for your yearly check up.   apixaban (ELIQUIS) 5 mg, Oral, 2 times daily   atorvastatin (LIPITOR) 40 MG tablet TAKE 1 TABLET BY MOUTH ONCE DAILY   cyclobenzaprine (FLEXERIL) 10 mg, Oral, 3 times daily PRN   lisinopril (ZESTRIL) 20 mg, Oral, Daily at bedtime   NON FORMULARY Pt uses a cpap nighlty   oxyCODONE (OXY IR/ROXICODONE) 5 mg, Oral, Every 4 hours PRN   traZODone (DESYREL) 50 MG tablet Take 1 tablet  by mouth at bedtime as needed for sleep.       01/17/2022   11:35 AM 01/16/2022    4:00 PM 01/16/2022    2:32 PM  Vitals with BMI  Systolic 254 270 623  Diastolic 72 91 90  Pulse 88 93 87      OBJECTIVE:   There were no vitals taken for this visit.  ***  ASSESSMENT/PLAN:   There are no diagnoses linked to this encounter.   There are no Patient Instructions on file for this visit.   Lind Covert, MD [place]

## 2022-02-23 ENCOUNTER — Encounter: Payer: Self-pay | Admitting: Physical Therapy

## 2022-02-23 ENCOUNTER — Encounter: Payer: Self-pay | Admitting: Family Medicine

## 2022-02-23 ENCOUNTER — Ambulatory Visit: Payer: 59 | Admitting: Physical Therapy

## 2022-02-23 ENCOUNTER — Ambulatory Visit (INDEPENDENT_AMBULATORY_CARE_PROVIDER_SITE_OTHER): Payer: 59 | Admitting: Family Medicine

## 2022-02-23 ENCOUNTER — Other Ambulatory Visit: Payer: Self-pay | Admitting: Family Medicine

## 2022-02-23 ENCOUNTER — Other Ambulatory Visit (HOSPITAL_COMMUNITY): Payer: Self-pay

## 2022-02-23 ENCOUNTER — Other Ambulatory Visit: Payer: Self-pay

## 2022-02-23 VITALS — BP 138/88 | HR 105 | Wt 283.0 lb

## 2022-02-23 DIAGNOSIS — M7989 Other specified soft tissue disorders: Secondary | ICD-10-CM | POA: Diagnosis not present

## 2022-02-23 DIAGNOSIS — M545 Low back pain, unspecified: Secondary | ICD-10-CM | POA: Diagnosis not present

## 2022-02-23 DIAGNOSIS — I10 Essential (primary) hypertension: Secondary | ICD-10-CM

## 2022-02-23 DIAGNOSIS — M5459 Other low back pain: Secondary | ICD-10-CM | POA: Diagnosis not present

## 2022-02-23 DIAGNOSIS — R2689 Other abnormalities of gait and mobility: Secondary | ICD-10-CM

## 2022-02-23 DIAGNOSIS — M6281 Muscle weakness (generalized): Secondary | ICD-10-CM | POA: Diagnosis not present

## 2022-02-23 MED ORDER — OLMESARTAN-AMLODIPINE-HCTZ 40-5-12.5 MG PO TABS
1.0000 | ORAL_TABLET | Freq: Every day | ORAL | 0 refills | Status: DC
  Filled 2022-02-23: qty 30, 30d supply, fill #0

## 2022-02-23 MED ORDER — HYDROCHLOROTHIAZIDE 12.5 MG PO TABS
12.5000 mg | ORAL_TABLET | Freq: Every day | ORAL | 1 refills | Status: DC
  Filled 2022-02-23: qty 30, 30d supply, fill #0
  Filled 2022-04-16: qty 30, 30d supply, fill #1

## 2022-02-23 MED ORDER — AMLODIPINE BESYLATE 5 MG PO TABS
5.0000 mg | ORAL_TABLET | Freq: Every day | ORAL | 3 refills | Status: DC
  Filled 2022-02-23: qty 90, 90d supply, fill #0
  Filled 2022-08-25: qty 90, 90d supply, fill #1

## 2022-02-23 MED ORDER — OLMESARTAN MEDOXOMIL 40 MG PO TABS
20.0000 mg | ORAL_TABLET | Freq: Every day | ORAL | 1 refills | Status: DC
  Filled 2022-02-23: qty 30, 60d supply, fill #0
  Filled 2022-05-19: qty 30, 60d supply, fill #1

## 2022-02-23 NOTE — Assessment & Plan Note (Signed)
Subacute lower extremity edema since his back surgery in October.  He does have history of DVT/PE and is on anticoagulation.  He reports good compliance with his Eliquis, however he did have to be off of it for at least 5 days prior to and shortly following surgery.  He does not complain of any shortness of breath or leg pain but it is worth evaluating for blood clots.  Will also check blood work to assess for anemia, thyroid disorders, electrolyte/liver abnormalities. No hx of heart failure but will check BNP, if markedly elevated will order echo.  -Vas U/S -CBC, TSH, BNP, CMP

## 2022-02-23 NOTE — Addendum Note (Signed)
Addended by: Sharion Settler on: 02/23/2022 02:07 PM   Modules accepted: Orders

## 2022-02-23 NOTE — Patient Instructions (Addendum)
It was wonderful to see you today.  Please bring ALL of your medications with you to every visit.   Today we talked about:  We are doing lab work today to check your electrolytes, your hemoglobin, your thyroid, and a marker for fluid overload. We will also do ultrasounds of your legs to make sure you don't have any blood clots. I have ordered I will send you a MyChart message if you have MyChart. Otherwise, I will give you a call for abnormal results or send a letter if everything returned back normal. If you don't hear from me in 2 weeks, please call the office.    Your blood pressure was elevated today. We are going to make the following changes to your blood pressure medications.  Please stop your lisinopril and amlodipine.  I am starting you on a combination medication that includes 3 different blood pressure medications.  Take 1 tablet daily.  Please come back in 2 weeks for blood pressure check and repeat blood work to check your electrolytes.  Thank you for coming to your visit as scheduled. We have had a large "no-show" problem lately, and this significantly limits our ability to see and care for patients. As a friendly reminder- if you cannot make your appointment please call to cancel. We do have a no show policy for those who do not cancel within 24 hours. Our policy is that if you miss or fail to cancel an appointment within 24 hours, 3 times in a 68-monthperiod, you may be dismissed from our clinic.   [facility] you for choosing [facility]   Please call [phone] any questions about today's appointment.  Please be sure to schedule follow up at the front  desk before you leave today.   ASharion Settler DO PGY-3 Family Medicine

## 2022-02-23 NOTE — Assessment & Plan Note (Signed)
Elevated today on two checks.  -Stop lisinopril, amlodipine -Start Olmesartan-amlodipine-HCTZ 40-5-12.5 mg daily -Return in 2 weeks for repeat BP check and BMP

## 2022-02-23 NOTE — Progress Notes (Signed)
    SUBJECTIVE:   CHIEF COMPLAINT / HPI:   Jeffrey [name] is a 56 y.o. male who presents to the [facility] clinic today to discuss the following concerns:   BLE Edema Patient reports ongoing leg swelling since his back surgery. Per Dr. Colleen Can note from 10/18: "Patient was admitted following an uncomplicated H8-2/9-9 OLIF and L3-S1 PSIF / S-P osteotomies". This was due to lumbar spondylosis with positive sagittal balance. He has been going to PT which is helping.   Was on discharged on flexeril and oxy following his surgery.   He is on Amlodipine for his blood pressure but this is not a new medication for him. He denies chest pain, shortness of breath. Denies orthopnea. No recent flights. He felt pain in his legs when he had a blood clot. Has not felt any pain with this leg swelling. He has been compliant with his Eliquis, had to hold it prior to surgery but restarted it on discharge. Was off of it for about 5 days.    Last Echo 08/2016 with EF 60-65%.  Lower extremity U/S 11/2016:  - chronic deep vein thrombosis involving    the posterior tibial, popliteal,femoral, and common femoral veins    of the right lower extremity.  - Findings consistent with chronic superficial thrombosis of the    greater saphenous vein and saphenofemoral junction.   PERTINENT  PMH / PSH: Hypertension, DVT, PE on chronic anticoagulation  OBJECTIVE:   BP 138/88   Pulse (!) 105   Wt 283 lb (128.4 kg)   SpO2 96%   BMI 34.45 kg/m   Vitals:   02/23/22 1336 02/23/22 1401  BP: (!) 146/91 138/88  Pulse: (!) 105   SpO2: 96%    General: NAD, pleasant, able to participate in exam Cardiac: RRR, no murmurs. Respiratory: CTAB, normal effort Extremities: 3+ pitting edema b/l  Skin: back surgical scar is healing well  Psych: Normal affect and mood  ASSESSMENT/PLAN:   Leg swelling Subacute lower extremity edema since his back surgery in October.  He does have history of DVT/PE and is on anticoagulation.  He  reports good compliance with his Eliquis, however he did have to be off of it for at least 5 days prior to and shortly following surgery.  He does not complain of any shortness of breath or leg pain but it is worth evaluating for blood clots.  Will also check blood work to assess for anemia, thyroid disorders, electrolyte/liver abnormalities. No hx of heart failure but will check BNP, if markedly elevated will order echo.  -Vas U/S -CBC, TSH, BNP, CMP  HTN (hypertension) Elevated today on two checks.  -Stop lisinopril, amlodipine -Start Olmesartan-amlodipine-HCTZ 40-5-12.5 mg daily -Return in 2 weeks for repeat BP check and BMP    Sharion Settler, [place]

## 2022-02-24 ENCOUNTER — Other Ambulatory Visit (HOSPITAL_COMMUNITY): Payer: Self-pay

## 2022-02-24 ENCOUNTER — Ambulatory Visit (HOSPITAL_COMMUNITY)
Admission: RE | Admit: 2022-02-24 | Discharge: 2022-02-24 | Disposition: A | Discharge status: Home / Self Care | Payer: 59 | Source: Ambulatory Visit | Attending: Family Medicine | Admitting: Family Medicine

## 2022-02-24 DIAGNOSIS — M7989 Other specified soft tissue disorders: Secondary | ICD-10-CM | POA: Diagnosis not present

## 2022-02-24 NOTE — Progress Notes (Signed)
Lower extremity venous duplex has been completed.   Preliminary results in CV Proc.   Jinny Blossom [place] 02/24/2022 4:22 PM

## 2022-02-25 LAB — BRAIN NATRIURETIC PEPTIDE: BNP: 19.8 pg/mL (ref 0.0–100.0)

## 2022-02-25 LAB — COMPREHENSIVE METABOLIC PANEL
ALT: 13 IU/L (ref 0–44)
AST: 16 IU/L (ref 0–40)
Albumin/Globulin Ratio: 1.5 (ref 1.2–2.2)
Albumin: 4 g/dL (ref 3.8–4.9)
Alkaline Phosphatase: 112 IU/L (ref 44–121)
BUN/Creatinine Ratio: 10 (ref 9–20)
BUN: 8 mg/dL (ref 6–24)
Bilirubin Total: 0.2 mg/dL (ref 0.0–1.2)
CO2: 21 mmol/L (ref 20–29)
Calcium: 8.9 mg/dL (ref 8.7–10.2)
Chloride: 103 mmol/L (ref 96–106)
Creatinine, Ser: 0.77 mg/dL (ref 0.76–1.27)
Globulin, Total: 2.6 g/dL (ref 1.5–4.5)
Glucose: 86 mg/dL (ref 70–99)
Potassium: 4.3 mmol/L (ref 3.5–5.2)
Sodium: 140 mmol/L (ref 134–144)
Total Protein: 6.6 g/dL (ref 6.0–8.5)
eGFR: 104 mL/min/{1.73_m2} (ref >59)

## 2022-02-25 LAB — CBC
Hematocrit: 39.7 % (ref 37.5–51.0)
Hemoglobin: 13.3 g/dL (ref 13.0–17.7)
MCH: 30.4 pg (ref 26.6–33.0)
MCHC: 33.5 g/dL (ref 31.5–35.7)
MCV: 91 fL (ref 79–97)
Platelets: 196 10*3/uL (ref 150–450)
RBC: 4.37 x10E6/uL (ref 4.14–5.80)
RDW: 13.4 % (ref 11.6–15.4)
WBC: 6 10*3/uL (ref 3.4–10.8)

## 2022-02-25 LAB — TSH RFX ON ABNORMAL TO FREE T4: TSH: 0.492 u[IU]/mL (ref 0.450–4.500)

## 2022-02-26 ENCOUNTER — Other Ambulatory Visit: Payer: Self-pay | Admitting: Family Medicine

## 2022-02-27 ENCOUNTER — Other Ambulatory Visit: Payer: Self-pay | Admitting: Student

## 2022-02-27 ENCOUNTER — Other Ambulatory Visit (HOSPITAL_COMMUNITY): Payer: Self-pay

## 2022-02-27 MED ORDER — APIXABAN 5 MG PO TABS
5.0000 mg | ORAL_TABLET | Freq: Two times a day (BID) | ORAL | 5 refills | Status: DC
  Filled 2022-02-27: qty 60, 30d supply, fill #0
  Filled 2022-04-20: qty 60, 30d supply, fill #1
  Filled 2022-07-14: qty 60, 30d supply, fill #2
  Filled 2022-08-25: qty 60, 30d supply, fill #3
  Filled 2022-10-08: qty 60, 30d supply, fill #4

## 2022-03-03 ENCOUNTER — Ambulatory Visit: Payer: 59 | Admitting: Student

## 2022-03-04 ENCOUNTER — Encounter: Payer: Self-pay | Admitting: Physical Therapy

## 2022-03-04 ENCOUNTER — Ambulatory Visit: Payer: 59 | Attending: Cardiology | Admitting: Physical Therapy

## 2022-03-04 DIAGNOSIS — M5459 Other low back pain: Secondary | ICD-10-CM | POA: Diagnosis not present

## 2022-03-04 DIAGNOSIS — M6281 Muscle weakness (generalized): Secondary | ICD-10-CM | POA: Diagnosis not present

## 2022-03-04 DIAGNOSIS — R2689 Other abnormalities of gait and mobility: Secondary | ICD-10-CM | POA: Insufficient documentation

## 2022-03-04 NOTE — Therapy (Signed)
OUTPATIENT PHYSICAL THERAPY TREATMENT NOTE   [name] MRN: [id number] DOB:21-Jun-1965, 56 y.o., male Today's Date: 03/04/2022  PCP: Holley Bouche, MD   REFERRING PROVIDER: Judith Part, MD  END OF SESSION:   PT End of Session - 03/04/22 0924     Visit Number 6    Number of Visits 17    Date for PT Re-Evaluation 03/25/22    Authorization Type [employer]    Authorization Time Period auth after 25th visit, FOTO v10    Authorization - Visit Number 6    Authorization - Number of Visits 25    Progress Note Due on Visit 10    PT Start Time 0925    PT Stop Time 1010    PT Time Calculation (min) 45 min    Activity Tolerance Patient tolerated treatment well;No increased pain    Behavior During Therapy WFL for tasks assessed/performed                 Past Medical History:  Diagnosis Date   Acute pulmonary embolus (HCC)    bilateral submassive PE in setting of missing several doses of Xarelto for his DVT   Arthritis    DVT (deep venous thrombosis) ([place]) 11/28/2013   RT LEG   High cholesterol    Hypertension    Lumbar spine scoliosis    Marijuana use    Sleep apnea    Past Surgical History:  Procedure Laterality Date   ABDOMINAL EXPOSURE N/A 01/14/2022   Procedure: ABDOMINAL EXPOSURE;  Surgeon: Marty Heck, MD;  Location: [place];  Service: Vascular;  Laterality: N/A;   ANTERIOR LUMBAR FUSION Left 01/14/2022   Procedure: Lumbar three-four Lumbar four-five Oblique Lumbar Interbody Fusion;  Surgeon: Judith Part, MD;  Location: [place];  Service: Neurosurgery;  Laterality: Left;   HERNIA REPAIR     3491 and 7915; umbilical hernia repair   KNEE SURGERY Bilateral    Left knee 1993, right knee 2004   TONSILLECTOMY     TOTAL KNEE ARTHROPLASTY Left 02/10/2017   Procedure: LEFT TOTAL KNEE ARTHROPLASTY;  Surgeon: Leandrew Koyanagi, MD;  Location: [place];  Service: Orthopedics;  Laterality: Left;   TOTAL KNEE ARTHROPLASTY Right 07/28/2017    Procedure: RIGHT TOTAL KNEE ARTHROPLASTY;  Surgeon: Leandrew Koyanagi, MD;  Location: [place];  Service: Orthopedics;  Laterality: Right;   TRANSFORAMINAL LUMBAR INTERBODY FUSION W/ MIS 1 LEVEL N/A 01/14/2022   Procedure: OPEN TRANSFORAMINAL LUMBAR INTERBODY FUSION  LUMBAR FIVE-SACRAL ONE ,LAMINECTOMIES AND POSTERIOR LATERAL INSTRUMENTATION FUSION  LUMBAR THREE TO FIVE SACRAL ONE;  Surgeon: Judith Part, MD;  Location: [place];  Service: Neurosurgery;  Laterality: N/A;   Patient Active Problem List   Diagnosis Date Noted   Leg swelling 02/23/2022   Scoliosis 01/14/2022   Snoring 09/22/2021   Insomnia due to mental condition 09/22/2021   At risk for mood disturbance 09/01/2021   Sleep disturbance 07/28/2021   Chronic bilateral low back pain without sciatica 07/25/2019   Neurogenic claudication 05/19/2019   Hyperlipidemia LDL goal <70 07/20/2018   History of pulmonary embolus (PE) 07/20/2017   Mild CAD 07/20/2017   S/p total knee replacement, bilateral 02/10/2017   Chronic anticoagulation 01/26/2017   Pulmonary embolism ([place]) 06/03/2016   Encounter for chronic pain management 04/06/2014   Tobacco abuse 12/05/2013   DVT (deep venous thrombosis) ([place]) 11/28/2013   HTN (hypertension) 05/04/2013   Lumbar back pain 05/04/2013   BMI 33.0-33.9,adult 05/04/2013   Hx  of substance abuse ([place]) 05/04/2013    REFERRING DIAG:  M54.50 (ICD-10-CM) - Lumbar back pain  THERAPY DIAG:  Other low back pain  Other abnormalities of gait and mobility  Muscle weakness (generalized)  Rationale for Evaluation and Treatment Rehabilitation  PERTINENT HISTORY:  CAD, PE, DVT, HTN S/p lumbar interbody fusion 01/14/22  PRECAUTIONS: Back, abdominal and posterior incisions    SUBJECTIVE:                                                                                                                                                                                      SUBJECTIVE STATEMENT:  Pt arrives with  no pain, denies soreness after last session. Primary complaint of stiffness. Notes that his stomach feels a bit off but he attributes to no breakfast this morning, denies any other adverse symptoms. Workup with PCP unremarkable in re: to LE swelling although he notes they changed his BP meds. No other new updates, follows up with surgeon Friday.      PAIN:  Are you having pain: 0/10 Location: low back How would you describe your pain? Stiff, achey Best in past week: 5/10 with movement, no resting pain at best Worst in past week: 11/10 Aggravating factors: walking/standing household distances, prolonged sitting Easing factors: rest breaks, ice    OBJECTIVE: (objective measures completed at initial evaluation unless otherwise dated)    DIAGNOSTIC FINDINGS:  Post op interbody fusion lumbar   PATIENT SURVEYS:  FOTO 32% FOTO 03/04/22 32%   COGNITION: Overall cognitive status: Within functional limits for tasks assessed                          SENSATION: Light touch intact B LEs     POSTURE: increased kyphosis and forward head posture, UT elevation   PALPATION/OBSERVATION: Deferred as pt wears back brace to eval - states he saw MD this morning and no concerns; abdominal incision healed and posterior incision with mild drainage but WNL per pt report   LUMBAR ROM:    AROM eval  Flexion    Extension    Right lateral flexion    Left lateral flexion    Right rotation    Left rotation     (Blank rows = not tested) Comments: Deferred on eval given spine precautions     LOWER EXTREMITY MMT:     MMT Right eval Left eval  Hip flexion 4p! 4p!  Hip abduction (modified sitting) 4+ 4+  Hip internal rotation      Hip external rotation      Knee flexion 5 4+  Knee extension 4+ 4+   (Blank  rows = not tested)   Comments: mild pulling in hamstring with knee ext on R, no pain with resistance; mild pain with hip flex B that resolves with cessation of movement       FUNCTIONAL  TESTS:  Sit to stand transfer, standard chair: heavy use of UE on rollator and chair, reduced fwd weight shift, wide BOS, significantly inc time and effort   TUG: 22 seconds from standard chair w/ rollator, gait mechanics as below, heavy UE use  02/16/22: TUG 12sec standard chair with rollator, continues with impaired gait mechanics and heavy UE use, fwd flexed posture    GAIT: Distance walked: within clinic Assistive device utilized: rollator Level of assistance: Modified independence Comments: significant forward flexed posture, partial step through pattern with intermittent step to, reduced hip extension B, reduced knee ROM throughout swing/stance   TODAY'S TREATMENT:                                                                                                    [facility] Adult PT Treatment:                                                DATE: 03/04/22 Therapeutic Exercise: NustepL5 37mn LE/UE during subjective Fwd/retro walking 5 laps in // bars cues for posture and mechanics Sidestepping in // bars 5 laps cues for posture/mechanics 4inch step up 3x5 each LE within // bars cues for form and control Triple flex + extension 3x5 each LE within // bars, cues for form and control upright posture Seated adductor iso x25 cues for form and pacing  [facility]                                                DATE: 02/23/22 Therapeutic Exercise: Nustep L5 528m LE/UE during subjective Adductor iso x20 seated Standing marches 2x8 each LE in // bars, cues for form and pacing  Standing hip ext // bars 2x8 each LE, cues for upright posture and hip squeeze Standing heel raise // bars 2x12 cues for posture and pacing  Fwd/retro walking in // bars 3 laps, cues for upright posture and mechanics Mini squat // bars x12 cues for form  [facility]                                                [facility]: 02[facility]/22 Therapeutic Exercise: Nu step L5 LE/UE during subjective,  [id number] Adductor iso 2x15, cues for pacing Seated swiss ball press down iso, 2x15 cues for breath control and posture Standing heel raises 2x12 // bars, cues for trunk posture  Standing hip extension //bars x8 each LE cues for trunk  posture Standing hip abd // bars x8 each LE, cues for reduced lean Standing marches // x8 each LE Mini squat // bars x10     PATIENT EDUCATION:  Education details: rationale for interventions Person educated: Patient Education method: Explanation, Demonstration, Tactile cues, Verbal cues Education comprehension: verbalized understanding, returned demonstration, verbal cues required, tactile cues required, and needs further education    HOME EXERCISE PROGRAM: Access Code: [email or web address] URL: [email or web address] Date: 02/16/2022 Prepared by: Enis Slipper  Exercises - Seated Transversus Abdominis Bracing  - 1 x daily - 7 x weekly - 3 sets - 10 reps - Sit to Stand with Armchair  - 1 x daily - 7 x weekly - 3 sets - 5 reps - Standing March with Counter Support  - 1 x daily - 7 x weekly - 1 sets - 8 reps - Standing Hip Extension with Counter Support  - 1 x daily - 7 x weekly - 1 sets - 8 reps   ASSESSMENT:   CLINICAL IMPRESSION: Pt arrives without pain, primary report of stiffness, no issues since last session. FOTO score remains unchanged compared to initial evaluation which contradicts pt report of improved  function, particularly in regarding to supine tolerance and mobility w/o device. Today focused on maximizing time spent in WB, majority of session spent in // bars. Also able to reduce seated rest breaks in favor of more standing rest. Pt tolerates session well with rest breaks, no pain or adverse events, reports resolution of stiffness, primary report of muscular fatigue. Pt departs today's session in no acute distress, all voiced questions/concerns addressed appropriately from PT perspective.       OBJECTIVE IMPAIRMENTS: Abnormal gait,  decreased activity tolerance, decreased balance, decreased endurance, decreased mobility, difficulty walking, decreased ROM, decreased strength, hypomobility, improper body mechanics, postural dysfunction, and pain.    ACTIVITY LIMITATIONS: carrying, lifting, bending, sitting, standing, squatting, sleeping, stairs, transfers, bed mobility, bathing, toileting, dressing, and locomotion level   PARTICIPATION LIMITATIONS: meal prep, cleaning, laundry, shopping, community activity, and yard work   PERSONAL FACTORS: Time since onset of injury/illness/exacerbation and 3+ comorbidities: CAD, PE, DVT, HTN  are also affecting patient's functional outcome.    REHAB POTENTIAL: Good   CLINICAL DECISION MAKING: Stable/uncomplicated   EVALUATION COMPLEXITY: Low     GOALS: Goals reviewed with patient? Yes   SHORT TERM GOALS: Target date: 02/25/2022   Pt will demonstrate appropriate understanding and performance of initially prescribed HEP in order to facilitate improved independence with management of symptoms.  Baseline: HEP provided on eval 03/04/22: Pt reports independence w/ HEP Goal status: MET   2. Pt will score greater than or equal to 42% on FOTO in order to demonstrate improved perception of function due to symptoms.            Baseline: 32% 03/04/22: 32%            Goal status: ONGOING   LONG TERM GOALS: Target date: 03/25/2022              Pt will score 51% on FOTO in order to demonstrate improved perception of functional status due to symptoms.  Baseline: 32% Goal status: ONGOING   2.  Pt will demonstrate ability to ambulate at least 1051f with LRAD and no more than 2pt increase in baseline pain on NPS in order to facilitate improved tolerance to community ambulation.  Baseline: pt unable to tolerate greater than household distances due to significant inc in pain Goal status: INITIAL   3.  Pt will demonstrate hip flexion MMT of 5/5 in order to facilitate improved clearance with  gait for reduced fall risk.  Baseline: 4/5 B w pain Goal status: INITIAL   4. Pt will be able to perform TUG in less than or equal to 13 sec with LRAD in order to indicate reduced risk of falling (cutoff score for fall risk 13.5 sec in community dwelling older adults per [facility] et al, 2000)            Baseline: 22sec with rollator  02/16/22: 12sec with rollator             Goal status: MET    PLAN:   PT FREQUENCY: 1-2x/week   PT DURATION: 8 weeks   PLANNED INTERVENTIONS: Therapeutic exercises, Therapeutic activity, Neuromuscular re-education, Balance training, Gait training, Patient/Family education, Self Care, Stair training, DME instructions, Aquatic Therapy, Cryotherapy, Moist heat, Taping, Manual therapy, and Re-evaluation.   PLAN FOR NEXT SESSION:    Progress functional mobility/strengthening exercises as able/appropriate, review/update HEP as appropriate.    Leeroy Cha PT, DPT 03/04/2022 10:11 AM

## 2022-03-06 ENCOUNTER — Ambulatory Visit: Payer: 59 | Admitting: Student

## 2022-03-06 NOTE — Patient Instructions (Incomplete)
It was great to see you! Thank you for allowing me to participate in your care!  I recommend that you always bring your medications to each appointment as this makes it easy to ensure we are on the correct medications and helps us not miss when refills are needed.  Our plans for today:  - *** -   We are checking some labs today, I will call you if they are abnormal will send you a MyChart message or a letter if they are normal.  If you do not hear about your labs in the next 2 weeks please let us know.***  Take care and seek immediate care sooner if you develop any concerns.   Dr. Shamra Bradeen, MD [facility]  

## 2022-03-06 NOTE — Progress Notes (Deleted)
  SUBJECTIVE:   CHIEF COMPLAINT / HPI:   Leg Swelling Patient w/ bilateral leg swelling since surgery.  CBC, BNP, TSH, CMP all normal. Vas US showed chron DVT in right leg and no DVT in left. Patient also on Amlodipine.    PERTINENT  PMH / PSH: ***  Past Medical History:  Diagnosis Date   Acute pulmonary embolus ([place])    bilateral submassive PE in setting of missing several doses of Xarelto for his DVT   Arthritis    DVT (deep venous thrombosis) ([place]) 11/28/2013   RT LEG   High cholesterol    Hypertension    Lumbar spine scoliosis    Marijuana use    Sleep apnea     OBJECTIVE:  There were no vitals taken for this visit. Physical Exam   ASSESSMENT/PLAN:  There are no diagnoses linked to this encounter. No follow-ups on file. Holley Bouche, MD 03/06/2022, 7:36 AM PGY-***, [facility] {    This will disappear when note is signed, click to select method of visit    :1}

## 2022-03-09 ENCOUNTER — Ambulatory Visit: Payer: 59 | Admitting: Physical Therapy

## 2022-03-12 ENCOUNTER — Telehealth: Payer: Self-pay | Admitting: Physical Therapy

## 2022-03-12 ENCOUNTER — Ambulatory Visit: Payer: 59 | Admitting: Physical Therapy

## 2022-03-12 NOTE — Telephone Encounter (Signed)
Spoke to patient regarding no-show to appointment. He had car trouble and phone trouble this morning. He confirmed his next appointment date and time.

## 2022-03-17 NOTE — Therapy (Signed)
OUTPATIENT PHYSICAL THERAPY TREATMENT NOTE   [name] MRN: [id number] DOB:07-Jul-1965, 56 y.o., male Today's Date: 03/18/2022  PCP: Holley Bouche, MD   REFERRING PROVIDER: Judith Part, MD  END OF SESSION:   PT End of Session - 03/18/22 0914     Visit Number 7    Number of Visits 17    Date for PT Re-Evaluation 03/25/22    Authorization Type [employer]    Authorization Time Period auth after 25th visit, FOTO v10    Authorization - Visit Number 7    Authorization - Number of Visits 25    PT Start Time 0920    PT Stop Time 1007    PT Time Calculation (min) 47 min    Activity Tolerance Patient tolerated treatment well;No increased pain    Behavior During Therapy WFL for tasks assessed/performed                  Past Medical History:  Diagnosis Date   Acute pulmonary embolus (HCC)    bilateral submassive PE in setting of missing several doses of Xarelto for his DVT   Arthritis    DVT (deep venous thrombosis) ([place]) 11/28/2013   RT LEG   High cholesterol    Hypertension    Lumbar spine scoliosis    Marijuana use    Sleep apnea    Past Surgical History:  Procedure Laterality Date   ABDOMINAL EXPOSURE N/A 01/14/2022   Procedure: ABDOMINAL EXPOSURE;  Surgeon: Marty Heck, MD;  Location[facility];  Service: Vascular;  Laterality: N/A;   ANTERIOR LUMBAR FUSION Left 01/14/2022   Procedure: Lumbar three-four Lumbar four-five Oblique Lumbar Interbody Fusion;  Surgeon: Judith Part, MD;  Location: [place];  Service: Neurosurgery;  Laterality: Left;   HERNIA REPAIR     8588 and 5027; umbilical hernia repair   KNEE SURGERY Bilateral    Left knee 1993, right knee 2004   TONSILLECTOMY     TOTAL KNEE ARTHROPLASTY Left 02/10/2017   Procedure: LEFT TOTAL KNEE ARTHROPLASTY;  Surgeon: Leandrew Koyanagi, MD;  Location: [facility];  Service: Orthopedics;  Laterality: Left;   TOTAL KNEE ARTHROPLASTY Right 07/28/2017   Procedure: RIGHT TOTAL KNEE  ARTHROPLASTY;  Surgeon: Leandrew Koyanagi, MD;  Location: [place];  Service: Orthopedics;  Laterality: Right;   TRANSFORAMINAL LUMBAR INTERBODY FUSION W/ MIS 1 LEVEL N/A 01/14/2022   Procedure: OPEN TRANSFORAMINAL LUMBAR INTERBODY FUSION  LUMBAR FIVE-SACRAL ONE ,LAMINECTOMIES AND POSTERIOR LATERAL INSTRUMENTATION FUSION  LUMBAR THREE TO FIVE SACRAL ONE;  Surgeon: Judith Part, MD;  Location: [place];  Service: Neurosurgery;  Laterality: N/A;   Patient Active Problem List   Diagnosis Date Noted   Leg swelling 02/23/2022   Scoliosis 01/14/2022   Snoring 09/22/2021   Insomnia due to mental condition 09/22/2021   At risk for mood disturbance 09/01/2021   Sleep disturbance 07/28/2021   Chronic bilateral low back pain without sciatica 07/25/2019   Neurogenic claudication 05/19/2019   Hyperlipidemia LDL goal <70 07/20/2018   History of pulmonary embolus (PE) 07/20/2017   Mild CAD 07/20/2017   S/p total knee replacement, bilateral 02/10/2017   Chronic anticoagulation 01/26/2017   Pulmonary embolism ([place]) 06/03/2016   Encounter for chronic pain management 04/06/2014   Tobacco abuse 12/05/2013   DVT (deep venous thrombosis) ([place]) 11/28/2013   HTN (hypertension) 05/04/2013   Lumbar back pain 05/04/2013   BMI 33.0-33.9,adult 05/04/2013   Hx of substance abuse ([place]) 05/04/2013  REFERRING DIAG:  M54.50 (ICD-10-CM) - Lumbar back pain  THERAPY DIAG:  Other low back pain  Other abnormalities of gait and mobility  Muscle weakness (generalized)  Rationale for Evaluation and Treatment Rehabilitation  PERTINENT HISTORY:  CAD, PE, DVT, HTN S/p lumbar interbody fusion 01/14/22  PRECAUTIONS: Back, abdominal and posterior incisions Per pt on 03/18/22, spine precautions lifted by surgeon at most recent visit     SUBJECTIVE:                                                                                                                                                                                       SUBJECTIVE STATEMENT:  Pt arrives w/o pain, states he saw surgeon and was lifted from spine precautions, able to move to tolerance. No issues after last session. Has tried using rollator less at home. Swelling much improved and now is consistently able to wear shoes.       PAIN:  Are you having pain: 0/10 Location: low back How would you describe your pain? Stiff, achey Best in past week: 5/10 with movement, no resting pain at best Worst in past week: 11/10 Aggravating factors: walking/standing household distances, prolonged sitting Easing factors: rest breaks, ice    OBJECTIVE: (objective measures completed at initial evaluation unless otherwise dated)    DIAGNOSTIC FINDINGS:  Post op interbody fusion lumbar   PATIENT SURVEYS:  FOTO 32% FOTO 03/04/22 32%   COGNITION: Overall cognitive status: Within functional limits for tasks assessed                          SENSATION: Light touch intact B LEs     POSTURE: increased kyphosis and forward head posture, UT elevation   PALPATION/OBSERVATION: Deferred as pt wears back brace to eval - states he saw MD this morning and no concerns; abdominal incision healed and posterior incision with mild drainage but WNL per pt report   LUMBAR ROM:    AROM 03/18/22  Flexion  75% distal to knee, mild pain  Extension  25% p!  Right lateral flexion    Left lateral flexion    Right rotation 50% stiffness  Left rotation  50% stiffness   (Blank rows = not tested) Comments: Deferred on eval given spine precautions     LOWER EXTREMITY MMT:     MMT Right eval Left eval  Hip flexion 4p! 4p!  Hip abduction (modified sitting) 4+ 4+  Hip internal rotation      Hip external rotation      Knee flexion 5 4+  Knee extension 4+ 4+   (Blank rows = not tested)  Comments: mild pulling in hamstring with knee ext on R, no pain with resistance; mild pain with hip flex B that resolves with cessation of movement       FUNCTIONAL  TESTS:  Sit to stand transfer, standard chair: heavy use of UE on rollator and chair, reduced fwd weight shift, wide BOS, significantly inc time and effort   TUG: 22 seconds from standard chair w/ rollator, gait mechanics as below, heavy UE use  02/16/22: TUG 12sec standard chair with rollator, continues with impaired gait mechanics and heavy UE use, fwd flexed posture    GAIT: Distance walked: within clinic Assistive device utilized: rollator Level of assistance: Modified independence Comments: significant forward flexed posture, partial step through pattern with intermittent step to, reduced hip extension B, reduced knee ROM throughout swing/stance   TODAY'S TREATMENT:                                                                                                  [facility] Adult PT Treatment:                                           DATE: 03/18/22 Therapeutic Exercise: Nustep L5 74mn LE/UE during subjective Swiss ball flexion, seated, x10, cues for breath control and appropriate ROM  Swiss ball iso press down 2x12 cues for breath control and posture HEP review/update  Therapeutic Activity: Fwd retro walking 4 laps // bars SBA, cues for posture, no breaks Sidestepping in // bars SBA 4 laps  Obstacle course figure 8, 6 cones w/ yellow/green/red cones (stop, slow, go), 3 laps w/ rest in between, CGA  [facility]                                                [facility]: 03[facility] Therapeutic Exercise: NustepL5 576m LE/UE during subjective Fwd/retro walking 5 laps in // bars cues for posture and mechanics Sidestepping in // bars 5 laps cues for posture/mechanics 4inch step up 3x5 each LE within // bars cues for form and control Triple flex + extension 3x5 each LE within // bars, cues for form and control upright posture Seated adductor iso x25 cues for form and pacing  [facility]                                                DATE: 02/23/22 Therapeutic  Exercise: Nustep L5 82m17mLE/UE during subjective Adductor iso x20 seated Standing marches 2x8 each LE in // bars, cues for form and pacing  Standing hip ext // bars 2x8 each LE, cues for upright posture and hip squeeze Standing heel raise // bars 2x12 cues for posture and pacing  Fwd/retro walking in // bars 3  laps, cues for upright posture and mechanics Mini squat // bars x12 cues for form      PATIENT EDUCATION:  Education details: rationale for interventions Person educated: Patient Education method: Explanation, Demonstration, Tactile cues, Verbal cues Education comprehension: verbalized understanding, returned demonstration, verbal cues required, tactile cues required, and needs further education    HOME EXERCISE PROGRAM: Access Code: [email or web address] URL: [email or web address] Date: 03/18/2022 Prepared by: Enis Slipper  Exercises - Sit to Stand with Armchair  - 1 x daily - 7 x weekly - 2 sets - 10 reps - Standing March with Counter Support  - 1 x daily - 7 x weekly - 2 sets - 8 reps - Standing Hip Extension with Counter Support  - 1 x daily - 7 x weekly - 2 sets - 8 reps - Heel Raises with Counter Support  - 1 x daily - 7 x weekly - 2 sets - 10 reps   ASSESSMENT:   CLINICAL IMPRESSION: Pt arrives w/ primary report of stiffness, denies significant pain, notes that he saw his surgeon last week and had spine precautions lifted. Lumbar ROM assessment as above, pt with most limitation in extension and rotation, mild transient pain but mostly limited by stiffness. Progressed environmental navigation to include cone weaving w/ change of pace, CGA and gait belt used but tolerates well without LOB. Also introduced gentle lumbar mobility exercises and more core activation work with good tolerance. No adverse events, no increase in pain on departure, reports of improved stiffness on departure. HEP update/review as above, handout provided. Pt departs today's session in no acute  distress, all voiced questions/concerns addressed appropriately from PT perspective.       OBJECTIVE IMPAIRMENTS: Abnormal gait, decreased activity tolerance, decreased balance, decreased endurance, decreased mobility, difficulty walking, decreased ROM, decreased strength, hypomobility, improper body mechanics, postural dysfunction, and pain.    ACTIVITY LIMITATIONS: carrying, lifting, bending, sitting, standing, squatting, sleeping, stairs, transfers, bed mobility, bathing, toileting, dressing, and locomotion level   PARTICIPATION LIMITATIONS: meal prep, cleaning, laundry, shopping, community activity, and yard work   PERSONAL FACTORS: Time since onset of injury/illness/exacerbation and 3+ comorbidities: CAD, PE, DVT, HTN  are also affecting patient's functional outcome.    REHAB POTENTIAL: Good   CLINICAL DECISION MAKING: Stable/uncomplicated   EVALUATION COMPLEXITY: Low     GOALS: Goals reviewed with patient? Yes   SHORT TERM GOALS: Target date: 02/25/2022   Pt will demonstrate appropriate understanding and performance of initially prescribed HEP in order to facilitate improved independence with management of symptoms.  Baseline: HEP provided on eval 03/04/22: Pt reports independence w/ HEP Goal status: MET   2. Pt will score greater than or equal to 42% on FOTO in order to demonstrate improved perception of function due to symptoms.            Baseline: 32% 03/04/22: 32%            Goal status: ONGOING   LONG TERM GOALS: Target date: 03/25/2022              Pt will score 51% on FOTO in order to demonstrate improved perception of functional status due to symptoms.  Baseline: 32% Goal status: ONGOING   2.  Pt will demonstrate ability to ambulate at least 1074f with LRAD and no more than 2pt increase in baseline pain on NPS in order to facilitate improved tolerance to community ambulation.  Baseline: pt unable to tolerate greater than household distances due to significant  inc in pain Goal  status: INITIAL   3.  Pt will demonstrate hip flexion MMT of 5/5 in order to facilitate improved clearance with gait for reduced fall risk.  Baseline: 4/5 B w pain Goal status: INITIAL   4. Pt will be able to perform TUG in less than or equal to 13 sec with LRAD in order to indicate reduced risk of falling (cutoff score for fall risk 13.5 sec in community dwelling older adults per [facility] et al, 2000)            Baseline: 22sec with rollator  02/16/22: 12sec with rollator             Goal status: MET    PLAN:   PT FREQUENCY: 1-2x/week   PT DURATION: 8 weeks   PLANNED INTERVENTIONS: Therapeutic exercises, Therapeutic activity, Neuromuscular re-education, Balance training, Gait training, Patient/Family education, Self Care, Stair training, DME instructions, Aquatic Therapy, Cryotherapy, Moist heat, Taping, Manual therapy, and Re-evaluation.   PLAN FOR NEXT SESSION:     Progress functional mobility/strengthening exercises as able/appropriate, review/update HEP as appropriate. Recert next session    Leeroy Cha PT, DPT 03/18/2022 10:10 AM

## 2022-03-18 ENCOUNTER — Encounter: Payer: Self-pay | Admitting: Physical Therapy

## 2022-03-18 ENCOUNTER — Ambulatory Visit: Payer: 59 | Admitting: Physical Therapy

## 2022-03-18 DIAGNOSIS — M5459 Other low back pain: Secondary | ICD-10-CM

## 2022-03-18 DIAGNOSIS — M6281 Muscle weakness (generalized): Secondary | ICD-10-CM | POA: Diagnosis not present

## 2022-03-18 DIAGNOSIS — R2689 Other abnormalities of gait and mobility: Secondary | ICD-10-CM | POA: Diagnosis not present

## 2022-03-20 NOTE — Therapy (Incomplete)
OUTPATIENT PHYSICAL THERAPY TREATMENT NOTE   [name] MRN: [id number] DOB:05-27-1965, 56 y.o., male Today's Date: 03/20/2022  PCP: Holley Bouche, MD   REFERRING PROVIDER: Judith Part, MD  END OF SESSION:          Past Medical History:  Diagnosis Date   Acute pulmonary embolus ([place])    bilateral submassive PE in setting of missing several doses of Xarelto for his DVT   Arthritis    DVT (deep venous thrombosis) ([place]) 11/28/2013   RT LEG   High cholesterol    Hypertension    Lumbar spine scoliosis    Marijuana use    Sleep apnea    Past Surgical History:  Procedure Laterality Date   ABDOMINAL EXPOSURE N/A 01/14/2022   Procedure: ABDOMINAL EXPOSURE;  Surgeon: Marty Heck, MD;  Location: [place];  Service: Vascular;  Laterality: N/A;   ANTERIOR LUMBAR FUSION Left 01/14/2022   Procedure: Lumbar three-four Lumbar four-five Oblique Lumbar Interbody Fusion;  Surgeon: Judith Part, MD;  Location: [place];  Service: Neurosurgery;  Laterality: Left;   HERNIA REPAIR     [id number] and [id number]; umbilical hernia repair   KNEE SURGERY Bilateral    Left knee 1993, right knee 2004   TONSILLECTOMY     TOTAL KNEE ARTHROPLASTY Left 02/10/2017   Procedure: LEFT TOTAL KNEE ARTHROPLASTY;  Surgeon: Leandrew Koyanagi, MD;  Location: [place];  Service: Orthopedics;  Laterality: Left;   TOTAL KNEE ARTHROPLASTY Right 07/28/2017   Procedure: RIGHT TOTAL KNEE ARTHROPLASTY;  Surgeon: Leandrew Koyanagi, MD;  Location: [place];  Service: Orthopedics;  Laterality: Right;   TRANSFORAMINAL LUMBAR INTERBODY FUSION W/ MIS 1 LEVEL N/A 01/14/2022   Procedure: OPEN TRANSFORAMINAL LUMBAR INTERBODY FUSION  LUMBAR FIVE-SACRAL ONE ,LAMINECTOMIES AND POSTERIOR LATERAL INSTRUMENTATION FUSION  LUMBAR THREE TO FIVE SACRAL ONE;  Surgeon: Judith Part, MD;  Location: [place];  Service: Neurosurgery;  Laterality: N/A;   Patient Active Problem List   Diagnosis Date Noted   Leg swelling  02/23/2022   Scoliosis 01/14/2022   Snoring 09/22/2021   Insomnia due to mental condition 09/22/2021   At risk for mood disturbance 09/01/2021   Sleep disturbance 07/28/2021   Chronic bilateral low back pain without sciatica 07/25/2019   Neurogenic claudication 05/19/2019   Hyperlipidemia LDL goal <70 07/20/2018   History of pulmonary embolus (PE) 07/20/2017   Mild CAD 07/20/2017   S/p total knee replacement, bilateral 02/10/2017   Chronic anticoagulation 01/26/2017   Pulmonary embolism ([place]) 06/03/2016   Encounter for chronic pain management 04/06/2014   Tobacco abuse 12/05/2013   DVT (deep venous thrombosis) ([place]) 11/28/2013   HTN (hypertension) 05/04/2013   Lumbar back pain 05/04/2013   BMI 33.0-33.9,adult 05/04/2013   Hx of substance abuse ([place]) 05/04/2013    REFERRING DIAG:  M54.50 (ICD-10-CM) - Lumbar back pain  THERAPY DIAG:  No diagnosis found.  Rationale for Evaluation and Treatment Rehabilitation  PERTINENT HISTORY:  CAD, PE, DVT, HTN S/p lumbar interbody fusion 01/14/22  PRECAUTIONS: Back, abdominal and posterior incisions Per pt on 03/18/22, spine precautions lifted by surgeon at most recent visit     SUBJECTIVE:  SUBJECTIVE STATEMENT:  ***  *** Pt arrives w/o pain, states he saw surgeon and was lifted from spine precautions, able to move to tolerance. No issues after last session. Has tried using rollator less at home. Swelling much improved and now is consistently able to wear shoes.       PAIN:  Are you having pain: 0/10 Location: low back How would you describe your pain? Stiff, achey Best in past week: 5/10 with movement, no resting pain at best Worst in past week: 11/10 Aggravating factors: walking/standing household distances, prolonged sitting Easing factors: rest  breaks, ice    OBJECTIVE: (objective measures completed at initial evaluation unless otherwise dated)    DIAGNOSTIC FINDINGS:  Post op interbody fusion lumbar   PATIENT SURVEYS:  FOTO 32% FOTO 03/04/22 32%   COGNITION: Overall cognitive status: Within functional limits for tasks assessed                          SENSATION: Light touch intact B LEs     POSTURE: increased kyphosis and forward head posture, UT elevation   PALPATION/OBSERVATION: Deferred as pt wears back brace to eval - states he saw MD this morning and no concerns; abdominal incision healed and posterior incision with mild drainage but WNL per pt report   LUMBAR ROM:    AROM 03/18/22  Flexion  75% distal to knee, mild pain  Extension  25% p!  Right lateral flexion    Left lateral flexion    Right rotation 50% stiffness  Left rotation  50% stiffness   (Blank rows = not tested) Comments: Deferred on eval given spine precautions     LOWER EXTREMITY MMT:     MMT Right eval Left eval  Hip flexion 4p! 4p!  Hip abduction (modified sitting) 4+ 4+  Hip internal rotation      Hip external rotation      Knee flexion 5 4+  Knee extension 4+ 4+   (Blank rows = not tested)   Comments: mild pulling in hamstring with knee ext on R, no pain with resistance; mild pain with hip flex B that resolves with cessation of movement       FUNCTIONAL TESTS:  Sit to stand transfer, standard chair: heavy use of UE on rollator and chair, reduced fwd weight shift, wide BOS, significantly inc time and effort   TUG: 22 seconds from standard chair w/ rollator, gait mechanics as below, heavy UE use  02/16/22: TUG 12sec standard chair with rollator, continues with impaired gait mechanics and heavy UE use, fwd flexed posture    GAIT: Distance walked: within clinic Assistive device utilized: rollator Level of assistance: Modified independence Comments: significant forward flexed posture, partial step through pattern with  intermittent step to, reduced hip extension B, reduced knee ROM throughout swing/stance   TODAY'S TREATMENT:                                                                                                 [facility] Adult PT Treatment:  DATE: 03/24/22 Therapeutic Exercise: *** Manual Therapy: *** Neuromuscular re-ed: *** Therapeutic Activity: *** Modalities: *** Self Care: ***  [facility] Adult PT Treatment:                                           DATE: 03/18/22 Therapeutic Exercise: Nustep L5 57mn LE/UE during subjective Swiss ball flexion, seated, x10, cues for breath control and appropriate ROM  Swiss ball iso press down 2x12 cues for breath control and posture HEP review/update  Therapeutic Activity: Fwd retro walking 4 laps // bars SBA, cues for posture, no breaks Sidestepping in // bars SBA 4 laps  Obstacle course figure 8, 6 cones w/ yellow/green/red cones (stop, slow, go), 3 laps w/ rest in between, CGA  [facility]                                                DATE: 03/04/22 Therapeutic Exercise: NustepL5 541m LE/UE during subjective Fwd/retro walking 5 laps in // bars cues for posture and mechanics Sidestepping in // bars 5 laps cues for posture/mechanics 4inch step up 3x5 each LE within // bars cues for form and control Triple flex + extension 3x5 each LE within // bars, cues for form and control upright posture Seated adductor iso x25 cues for form and pacing      PATIENT EDUCATION:  Education details: rationale for interventions Person educated: Patient Education method: Explanation, Demonstration, Tactile cues, Verbal cues Education comprehension: verbalized understanding, returned demonstration, verbal cues required, tactile cues required, and needs further education    HOME EXERCISE PROGRAM: Access Code: [email or web address] URL: [email or web address] Date: 03/18/2022 Prepared by: DaEnis SlipperExercises - Sit to Stand with Armchair  - 1 x daily - 7 x weekly - 2 sets - 10 reps - Standing March with Counter Support  - 1 x daily - 7 x weekly - 2 sets - 8 reps - Standing Hip Extension with Counter Support  - 1 x daily - 7 x weekly - 2 sets - 8 reps - Heel Raises with Counter Support  - 1 x daily - 7 x weekly - 2 sets - 10 reps   ASSESSMENT:   CLINICAL IMPRESSION: ***  *** Pt arrives w/ primary report of stiffness, denies significant pain, notes that he saw his surgeon last week and had spine precautions lifted. Lumbar ROM assessment as above, pt with most limitation in extension and rotation, mild transient pain but mostly limited by stiffness. Progressed environmental navigation to include cone weaving w/ change of pace, CGA and gait belt used but tolerates well without LOB. Also introduced gentle lumbar mobility exercises and more core activation work with good tolerance. No adverse events, no increase in pain on departure, reports of improved stiffness on departure. HEP update/review as above, handout provided. Pt departs today's session in no acute distress, all voiced questions/concerns addressed appropriately from PT perspective.       OBJECTIVE IMPAIRMENTS: Abnormal gait, decreased activity tolerance, decreased balance, decreased endurance, decreased mobility, difficulty walking, decreased ROM, decreased strength, hypomobility, improper body mechanics, postural dysfunction, and pain.    ACTIVITY LIMITATIONS: carrying, lifting, bending, sitting, standing, squatting, sleeping, stairs, transfers, bed mobility, bathing, toileting, dressing, and locomotion level   PARTICIPATION LIMITATIONS: meal prep,  cleaning, laundry, shopping, community activity, and yard work   PERSONAL FACTORS: Time since onset of injury/illness/exacerbation and 3+ comorbidities: CAD, PE, DVT, HTN  are also affecting patient's functional outcome.    REHAB POTENTIAL: Good   CLINICAL DECISION MAKING:  Stable/uncomplicated   EVALUATION COMPLEXITY: Low     GOALS: Goals reviewed with patient? Yes   SHORT TERM GOALS: Target date: 02/25/2022   Pt will demonstrate appropriate understanding and performance of initially prescribed HEP in order to facilitate improved independence with management of symptoms.  Baseline: HEP provided on eval 03/04/22: Pt reports independence w/ HEP Goal status: MET   2. Pt will score greater than or equal to 42% on FOTO in order to demonstrate improved perception of function due to symptoms.            Baseline: 32% 03/04/22: 32%            Goal status: ONGOING   LONG TERM GOALS: Target date: 03/25/2022              Pt will score 51% on FOTO in order to demonstrate improved perception of functional status due to symptoms.  Baseline: 32% Goal status: ONGOING   2.  Pt will demonstrate ability to ambulate at least 1058f with LRAD and no more than 2pt increase in baseline pain on NPS in order to facilitate improved tolerance to community ambulation.  Baseline: pt unable to tolerate greater than household distances due to significant inc in pain Goal status: INITIAL   3.  Pt will demonstrate hip flexion MMT of 5/5 in order to facilitate improved clearance with gait for reduced fall risk.  Baseline: 4/5 B w pain Goal status: INITIAL   4. Pt will be able to perform TUG in less than or equal to 13 sec with LRAD in order to indicate reduced risk of falling (cutoff score for fall risk 13.5 sec in community dwelling older adults per [facility], 2000)            Baseline: 22sec with rollator  02/16/22: 12sec with rollator             Goal status: MET    PLAN:   PT FREQUENCY: 1-2x/week   PT DURATION: 8 weeks   PLANNED INTERVENTIONS: Therapeutic exercises, Therapeutic activity, Neuromuscular re-education, Balance training, Gait training, Patient/Family education, Self Care, Stair training, DME instructions, Aquatic Therapy, Cryotherapy, Moist heat,  Taping, Manual therapy, and Re-evaluation.   PLAN FOR NEXT SESSION:    ***  Progress functional mobility/strengthening exercises as able/appropriate, review/update HEP as appropriate. Recert next session    DLeeroy ChaPT, DPT 03/20/2022 8:46 AM

## 2022-03-24 ENCOUNTER — Ambulatory Visit: Payer: 59 | Admitting: Physical Therapy

## 2022-03-24 ENCOUNTER — Telehealth: Payer: Self-pay | Admitting: Physical Therapy

## 2022-03-24 NOTE — Telephone Encounter (Signed)
Called pt regarding this morning's missed appointment - no answer, left voicemail w/ attendance policy and callback number, reminder of next appointment

## 2022-03-26 ENCOUNTER — Ambulatory Visit: Payer: 59 | Admitting: Physical Therapy

## 2022-03-31 ENCOUNTER — Encounter: Payer: Self-pay | Admitting: Physical Therapy

## 2022-03-31 ENCOUNTER — Ambulatory Visit: Payer: Commercial Managed Care - PPO | Attending: Cardiology | Admitting: Physical Therapy

## 2022-03-31 DIAGNOSIS — R2689 Other abnormalities of gait and mobility: Secondary | ICD-10-CM | POA: Insufficient documentation

## 2022-03-31 DIAGNOSIS — M6281 Muscle weakness (generalized): Secondary | ICD-10-CM | POA: Insufficient documentation

## 2022-03-31 DIAGNOSIS — M5459 Other low back pain: Secondary | ICD-10-CM | POA: Insufficient documentation

## 2022-03-31 NOTE — Addendum Note (Signed)
Addended by: Jimmye Norman, Leeza Heiner A on: 03/31/2022 01:12 PM   Modules accepted: Orders

## 2022-03-31 NOTE — Therapy (Addendum)
OUTPATIENT PHYSICAL THERAPY TREATMENT NOTE + RECERTIFICATION   [name] MRN: [id number] DOB:14-Sep-1965, 57 y.o., male Today's Date: 03/31/2022  PCP: Holley Bouche, MD   REFERRING PROVIDER: Judith Part, MD  END OF SESSION:   PT End of Session - 03/31/22 0848     Visit Number 8    Number of Visits 25    Date for PT Re-Evaluation 04/28/22    Authorization Type [employer]    Authorization Time Period auth after 25th visit, FOTO v10    Authorization - Visit Number 8    Authorization - Number of Visits 25    Progress Note Due on Visit 10    PT Start Time 0845    PT Stop Time 0930    PT Time Calculation (min) 45 min                  Past Medical History:  Diagnosis Date   Acute pulmonary embolus ([place])    bilateral submassive PE in setting of missing several doses of Xarelto for his DVT   Arthritis    DVT (deep venous thrombosis) ([place]) 11/28/2013   RT LEG   High cholesterol    Hypertension    Lumbar spine scoliosis    Marijuana use    Sleep apnea    Past Surgical History:  Procedure Laterality Date   ABDOMINAL EXPOSURE N/A 01/14/2022   Procedure: ABDOMINAL EXPOSURE;  Surgeon: Marty Heck, MD;  Location: [facility];  Service: Vascular;  Laterality: N/A;   ANTERIOR LUMBAR FUSION Left 01/14/2022   Procedure: Lumbar three-four Lumbar four-five Oblique Lumbar Interbody Fusion;  Surgeon: Judith Part, MD;  Location: [facility];  Service: Neurosurgery;  Laterality: Left;   HERNIA REPAIR     3419 and 3790; umbilical hernia repair   KNEE SURGERY Bilateral    Left knee 1993, right knee 2004   TONSILLECTOMY     TOTAL KNEE ARTHROPLASTY Left 02/10/2017   Procedure: LEFT TOTAL KNEE ARTHROPLASTY;  Surgeon: Leandrew Koyanagi, MD;  Location: [facility];  Service: Orthopedics;  Laterality: Left;   TOTAL KNEE ARTHROPLASTY Right 07/28/2017   Procedure: RIGHT TOTAL KNEE ARTHROPLASTY;  Surgeon: Leandrew Koyanagi, MD;  Location: [facility];  Service: Orthopedics;   Laterality: Right;   TRANSFORAMINAL LUMBAR INTERBODY FUSION W/ MIS 1 LEVEL N/A 01/14/2022   Procedure: OPEN TRANSFORAMINAL LUMBAR INTERBODY FUSION  LUMBAR FIVE-SACRAL ONE ,LAMINECTOMIES AND POSTERIOR LATERAL INSTRUMENTATION FUSION  LUMBAR THREE TO FIVE SACRAL ONE;  Surgeon: Judith Part, MD;  Location: [place];  Service: Neurosurgery;  Laterality: N/A;   Patient Active Problem List   Diagnosis Date Noted   Leg swelling 02/23/2022   Scoliosis 01/14/2022   Snoring 09/22/2021   Insomnia due to mental condition 09/22/2021   At risk for mood disturbance 09/01/2021   Sleep disturbance 07/28/2021   Chronic bilateral low back pain without sciatica 07/25/2019   Neurogenic claudication 05/19/2019   Hyperlipidemia LDL goal <70 07/20/2018   History of pulmonary embolus (PE) 07/20/2017   Mild CAD 07/20/2017   S/p total knee replacement, bilateral 02/10/2017   Chronic anticoagulation 01/26/2017   Pulmonary embolism ([place]) 06/03/2016   Encounter for chronic pain management 04/06/2014   Tobacco abuse 12/05/2013   DVT (deep venous thrombosis) ([place]) 11/28/2013   HTN (hypertension) 05/04/2013   Lumbar back pain 05/04/2013   BMI 33.0-33.9,adult 05/04/2013   Hx of substance abuse ([place]) 05/04/2013    REFERRING DIAG:  M54.50 (ICD-10-CM) - Lumbar back pain  THERAPY DIAG:  Other low back pain  Muscle weakness (generalized)  Other abnormalities of gait and mobility  Rationale for Evaluation and Treatment Rehabilitation  PERTINENT HISTORY:  CAD, PE, DVT, HTN S/p lumbar interbody fusion 01/14/22  PRECAUTIONS: Back, abdominal and posterior incisions Per pt on 03/18/22, spine precautions lifted by surgeon at most recent visit     SUBJECTIVE:                                                                                                                                                                                      SUBJECTIVE STATEMENT:  Pt arrives 5/10 pain and reports  that he is  using his cane again instead of the rollator. He reports that the cold weather is increasing his pain and stiffness.       PAIN:  Are you having pain: 5/10 Location: low back How would you describe your pain? Stiff, achey Best in past week: 5/10 with movement, no resting pain at best Worst in past week: 11/10 Aggravating factors: walking/standing household distances, prolonged sitting Easing factors: rest breaks, ice    OBJECTIVE: (objective measures completed at initial evaluation unless otherwise dated)    DIAGNOSTIC FINDINGS:  Post op interbody fusion lumbar   PATIENT SURVEYS:  FOTO 32% FOTO 03/04/22 32% FOTO 03/31/22 35%   COGNITION: Overall cognitive status: Within functional limits for tasks assessed                          SENSATION: Light touch intact B LEs     POSTURE: increased kyphosis and forward head posture, UT elevation   PALPATION/OBSERVATION: Deferred as pt wears back brace to eval - states he saw MD this morning and no concerns; abdominal incision healed and posterior incision with mild drainage but WNL per pt report   LUMBAR ROM:    AROM 03/18/22  Flexion  75% distal to knee, mild pain  Extension  25% p!  Right lateral flexion    Left lateral flexion    Right rotation 50% stiffness  Left rotation  50% stiffness   (Blank rows = not tested) Comments: Deferred on eval given spine precautions     LOWER EXTREMITY MMT:     MMT Right eval Left eval Right 03/31/22 Left 03/31/22  Hip flexion 4p! 4p! 4+ P 4+  Hip abduction (modified sitting) 4+ 4+ 4+ 4+  Hip internal rotation        Hip external rotation     4- 4  Knee flexion 5 4+ 5 5  Knee extension 4+ 4+ 5 5   (Blank rows = not tested)   Comments:  mild pulling in hamstring with knee ext on R, no pain with resistance; mild pain with hip flex B that resolves with cessation of movement       FUNCTIONAL TESTS:  Sit to stand transfer, standard chair: heavy use of UE on rollator and chair,  reduced fwd weight shift, wide BOS, significantly inc time and effort   TUG: 22 seconds from standard chair w/ rollator, gait mechanics as below, heavy UE use  02/16/22: TUG 12sec standard chair with rollator, continues with impaired gait mechanics and heavy UE use, fwd flexed posture  2 minute walk test: 03/31/22: 295 Feet    GAIT: Distance walked: within clinic Assistive device utilized: rollator Level of assistance: Modified independence Comments: significant forward flexed posture, partial step through pattern with intermittent step to, reduced hip extension B, reduced knee ROM throughout swing/stance   TODAY'S TREATMENT:                                                                                                  [facility] Adult PT Treatment:                                                DATE: 03/31/21 Therapeutic Exercise: Nustep L6 5 minutes UE/LE  Supine Marching 10 x 2 with cues for lumbar stabilization. Hooklying gluteal squeeze 5 sec x 10 Mini Bridge 8 reps x 2 sets  Feet on exercise ball: knee flexion and LTR - cues for ab brace and controlled movement.  Therapeutic Activity:  2 MWT : 295 Feet with SPC Gait with bilat trekking poles 100 feet as a trial      [facility] Adult PT Treatment:                                           DATE: 03/18/22 Therapeutic Exercise: Nustep L5 27mn LE/UE during subjective Swiss ball flexion, seated, x10, cues for breath control and appropriate ROM  Swiss ball iso press down 2x12 cues for breath control and posture HEP review/update  Therapeutic Activity: Fwd retro walking 4 laps // bars SBA, cues for posture, no breaks Sidestepping in // bars SBA 4 laps  Obstacle course figure 8, 6 cones w/ yellow/green/red cones (stop, slow, go), 3 laps w/ rest in between, CGA  [facility]                                                DATE: 03/04/22 Therapeutic Exercise: NustepL5 527m LE/UE during subjective Fwd/retro walking 5 laps in // bars  cues for posture and mechanics Sidestepping in // bars 5 laps cues for posture/mechanics 4inch step up 3x5 each LE within // bars cues for form and control Triple flex + extension  3x5 each LE within // bars, cues for form and control upright posture Seated adductor iso x25 cues for form and pacing  [facility] Adult PT Treatment:                                                DATE: 02/23/22 Therapeutic Exercise: Nustep L5 70mn LE/UE during subjective Adductor iso x20 seated Standing marches 2x8 each LE in // bars, cues for form and pacing  Standing hip ext // bars 2x8 each LE, cues for upright posture and hip squeeze Standing heel raise // bars 2x12 cues for posture and pacing  Fwd/retro walking in // bars 3 laps, cues for upright posture and mechanics Mini squat // bars x12 cues for form      PATIENT EDUCATION:  Education details: rationale for interventions Person educated: Patient Education method: Explanation, Demonstration, Tactile cues, Verbal cues Education comprehension: verbalized understanding, returned demonstration, verbal cues required, tactile cues required, and needs further education    HOME EXERCISE PROGRAM: Access Code: [email or web address] URL: [email or web address] Date: 03/18/2022 Prepared by: DEnis Slipper Exercises - Sit to Stand with Armchair  - 1 x daily - 7 x weekly - 2 sets - 10 reps - Standing March with Counter Support  - 1 x daily - 7 x weekly - 2 sets - 8 reps - Standing Hip Extension with Counter Support  - 1 x daily - 7 x weekly - 2 sets - 8 reps - Heel Raises with Counter Support  - 1 x daily - 7 x weekly - 2 sets - 10 reps   ASSESSMENT:   CLINICAL IMPRESSION: Pt arrives w/ primary report of stiffness due to cold weather. He is ambulating with tall cane/ walking stick today. Introduced trekking poles as a possible AD. He reported a positive response with gait using trekking poles in clinic and achieved more erect posture. Captured 2 MWT today with  pt achieving 295 feet using SPC and pain increased from 5/10 to 7/10. His LE MMT has improved compared to initial measures. FOTO score improved minimally. He reports continued limitations with activity on his feet and uses Rolator intermittently with household/ kitchen activities. No additional LTGs met, however he is making slow progress toward remaining goals. He will benefit from continued PT to achieve LTGs and return to PLOF.       OBJECTIVE IMPAIRMENTS: Abnormal gait, decreased activity tolerance, decreased balance, decreased endurance, decreased mobility, difficulty walking, decreased ROM, decreased strength, hypomobility, improper body mechanics, postural dysfunction, and pain.    ACTIVITY LIMITATIONS: carrying, lifting, bending, sitting, standing, squatting, sleeping, stairs, transfers, bed mobility, bathing, toileting, dressing, and locomotion level   PARTICIPATION LIMITATIONS: meal prep, cleaning, laundry, shopping, community activity, and yard work   PERSONAL FACTORS: Time since onset of injury/illness/exacerbation and 3+ comorbidities: CAD, PE, DVT, HTN  are also affecting patient's functional outcome.    REHAB POTENTIAL: Good   CLINICAL DECISION MAKING: Stable/uncomplicated   EVALUATION COMPLEXITY: Low     GOALS: Goals reviewed with patient? Yes   SHORT TERM GOALS: Target date: 02/25/2022   Pt will demonstrate appropriate understanding and performance of initially prescribed HEP in order to facilitate improved independence with management of symptoms.  Baseline: HEP provided on eval 03/04/22: Pt reports independence w/ HEP Goal status: MET   2. Pt will score greater than or equal to 42% on FOTO in  order to demonstrate improved perception of function due to symptoms.            Baseline: 32% 03/04/22: 32% 03/31/22: 35%            Goal status: ONGOING   LONG TERM GOALS: Target date: 05/01/22 (updated 03/31/22)              Pt will score 51% on FOTO in order to demonstrate  improved perception of functional status due to symptoms.  Baseline: 32% 03/31/22: 35% Goal status: ONGOING   2.  Pt will demonstrate ability to ambulate at least 1037f with LRAD and no more than 2pt increase in baseline pain on NPS in order to facilitate improved tolerance to community ambulation.  Baseline: pt unable to tolerate greater than household distances due to significant inc in pain 03/31/22: able to complete 2 minute walk test at 295 feet with increased pain from 5/10 to 7/10.  Goal status: ONGOING   3.  Pt will demonstrate hip flexion MMT of 5/5 in order to facilitate improved clearance with gait for reduced fall risk.  Baseline: 4/5 B w pain 03/31/21: see chart Goal status: PARTIALLY MET   4. Pt will be able to perform TUG in less than or equal to 13 sec with LRAD in order to indicate reduced risk of falling (cutoff score for fall risk 13.5 sec in community dwelling older adults [facility], [facility])            Baseline: 22sec with rollator  02/16/22: 12sec with rollator             Goal status: MET    PLAN (updated 03/31/22):   PT FREQUENCY: 1-2x/week   PT DURATION: 4 weeks   PLANNED INTERVENTIONS: Therapeutic exercises, Therapeutic activity, Neuromuscular re-education, Balance training, Gait training, Patient/Family education, Self Care, Stair training, DME instructions, Aquatic Therapy, Cryotherapy, Moist heat, Taping, Manual therapy, and Re-evaluation.   PLAN FOR NEXT SESSION:     Progress functional mobility/strengthening exercises as able/appropriate, review/update HEP as appropriate.    JHessie Diener PTA 03/31/22 9:45 AM Phone: [phone]: [phone]  Recert: DLeeroy ChaPT, DPT 03/31/2022 1:10 PM

## 2022-04-01 NOTE — Therapy (Incomplete)
OUTPATIENT PHYSICAL THERAPY TREATMENT NOTE + RECERTIFICATION   [name] MRN: [id number] DOB:10-20-1965, 57 y.o., male Today's Date: 04/01/2022  PCP: Holley Bouche, MD   REFERRING PROVIDER: Judith Part, MD  END OF SESSION:          Past Medical History:  Diagnosis Date   Acute pulmonary embolus ([place])    bilateral submassive PE in setting of missing several doses of Xarelto for his DVT   Arthritis    DVT (deep venous thrombosis) ([place]) 11/28/2013   RT LEG   High cholesterol    Hypertension    Lumbar spine scoliosis    Marijuana use    Sleep apnea    Past Surgical History:  Procedure Laterality Date   ABDOMINAL EXPOSURE N/A 01/14/2022   Procedure: ABDOMINAL EXPOSURE;  Surgeon: Marty Heck, MD;  Location: [place];  Service: Vascular;  Laterality: N/A;   ANTERIOR LUMBAR FUSION Left 01/14/2022   Procedure: Lumbar three-four Lumbar four-five Oblique Lumbar Interbody Fusion;  Surgeon: Judith Part, MD;  Location: [place];  Service: Neurosurgery;  Laterality: Left;   HERNIA REPAIR     1898 and 4210; umbilical hernia repair   KNEE SURGERY Bilateral    Left knee 1993, right knee 2004   TONSILLECTOMY     TOTAL KNEE ARTHROPLASTY Left 02/10/2017   Procedure: LEFT TOTAL KNEE ARTHROPLASTY;  Surgeon: Leandrew Koyanagi, MD;  Location: [place];  Service: Orthopedics;  Laterality: Left;   TOTAL KNEE ARTHROPLASTY Right 07/28/2017   Procedure: RIGHT TOTAL KNEE ARTHROPLASTY;  Surgeon: Leandrew Koyanagi, MD;  Location: [place];  Service: Orthopedics;  Laterality: Right;   TRANSFORAMINAL LUMBAR INTERBODY FUSION W/ MIS 1 LEVEL N/A 01/14/2022   Procedure: OPEN TRANSFORAMINAL LUMBAR INTERBODY FUSION  LUMBAR FIVE-SACRAL ONE ,LAMINECTOMIES AND POSTERIOR LATERAL INSTRUMENTATION FUSION  LUMBAR THREE TO FIVE SACRAL ONE;  Surgeon: Judith Part, MD;  Location: [place];  Service: Neurosurgery;  Laterality: N/A;   Patient Active Problem List   Diagnosis Date Noted    Leg swelling 02/23/2022   Scoliosis 01/14/2022   Snoring 09/22/2021   Insomnia due to mental condition 09/22/2021   At risk for mood disturbance 09/01/2021   Sleep disturbance 07/28/2021   Chronic bilateral low back pain without sciatica 07/25/2019   Neurogenic claudication 05/19/2019   Hyperlipidemia LDL goal <70 07/20/2018   History of pulmonary embolus (PE) 07/20/2017   Mild CAD 07/20/2017   S/p total knee replacement, bilateral 02/10/2017   Chronic anticoagulation 01/26/2017   Pulmonary embolism ([place]) 06/03/2016   Encounter for chronic pain management 04/06/2014   Tobacco abuse 12/05/2013   DVT (deep venous thrombosis) ([place]) 11/28/2013   HTN (hypertension) 05/04/2013   Lumbar back pain 05/04/2013   BMI 33.0-33.9,adult 05/04/2013   Hx of substance abuse ([place]) 05/04/2013    REFERRING DIAG:  M54.50 (ICD-10-CM) - Lumbar back pain  THERAPY DIAG:  No diagnosis found.  Rationale for Evaluation and Treatment Rehabilitation  PERTINENT HISTORY:  CAD, PE, DVT, HTN S/p lumbar interbody fusion 01/14/22  PRECAUTIONS: Back, abdominal and posterior incisions Per pt on 03/18/22, spine precautions lifted by surgeon at most recent visit     SUBJECTIVE:  SUBJECTIVE STATEMENT:  ***  *** Pt arrives 5/10 pain and reports  that he is using his cane again instead of the rollator. He reports that the cold weather is increasing his pain and stiffness.       PAIN:  Are you having pain: 5/10 Location: low back How would you describe your pain? Stiff, achey Best in past week: 5/10 with movement, no resting pain at best Worst in past week: 11/10 Aggravating factors: walking/standing household distances, prolonged sitting Easing factors: rest breaks, ice    OBJECTIVE: (objective measures completed at initial  evaluation unless otherwise dated)    DIAGNOSTIC FINDINGS:  Post op interbody fusion lumbar   PATIENT SURVEYS:  FOTO 32% FOTO 03/04/22 32% FOTO 03/31/22 35%   COGNITION: Overall cognitive status: Within functional limits for tasks assessed                          SENSATION: Light touch intact B LEs     POSTURE: increased kyphosis and forward head posture, UT elevation   PALPATION/OBSERVATION: Deferred as pt wears back brace to eval - states he saw MD this morning and no concerns; abdominal incision healed and posterior incision with mild drainage but WNL per pt report   LUMBAR ROM:    AROM 03/18/22  Flexion  75% distal to knee, mild pain  Extension  25% p!  Right lateral flexion    Left lateral flexion    Right rotation 50% stiffness  Left rotation  50% stiffness   (Blank rows = not tested) Comments: Deferred on eval given spine precautions     LOWER EXTREMITY MMT:     MMT Right eval Left eval Right 03/31/22 Left 03/31/22  Hip flexion 4p! 4p! 4+ P 4+  Hip abduction (modified sitting) 4+ 4+ 4+ 4+  Hip internal rotation        Hip external rotation     4- 4  Knee flexion 5 4+ 5 5  Knee extension 4+ 4+ 5 5   (Blank rows = not tested)   Comments: mild pulling in hamstring with knee ext on R, no pain with resistance; mild pain with hip flex B that resolves with cessation of movement       FUNCTIONAL TESTS:  Sit to stand transfer, standard chair: heavy use of UE on rollator and chair, reduced fwd weight shift, wide BOS, significantly inc time and effort   TUG: 22 seconds from standard chair w/ rollator, gait mechanics as below, heavy UE use  02/16/22: TUG 12sec standard chair with rollator, continues with impaired gait mechanics and heavy UE use, fwd flexed posture  2 minute walk test: 03/31/22: 295 Feet    GAIT: Distance walked: within clinic Assistive device utilized: rollator Level of assistance: Modified independence Comments: significant forward flexed  posture, partial step through pattern with intermittent step to, reduced hip extension B, reduced knee ROM throughout swing/stance   TODAY'S TREATMENT:  [facility] Adult PT Treatment:                                                DATE: 04/02/22 Therapeutic Exercise: *** Manual Therapy: *** Neuromuscular re-ed: *** Therapeutic Activity: *** Modalities: *** Self Care: ***   [facility] Adult PT Treatment:                                                DATE: 03/31/21 Therapeutic Exercise: Nustep L6 5 minutes UE/LE  Supine Marching 10 x 2 with cues for lumbar stabilization. Hooklying gluteal squeeze 5 sec x 10 Mini Bridge 8 reps x 2 sets  Feet on exercise ball: knee flexion and LTR - cues for ab brace and controlled movement.  Therapeutic Activity:  2 MWT : 295 Feet with SPC Gait with bilat trekking poles 100 feet as a trial      [facility] Adult PT Treatment:                                           DATE: 03/18/22 Therapeutic Exercise: Nustep L5 80mn LE/UE during subjective Swiss ball flexion, seated, x10, cues for breath control and appropriate ROM  Swiss ball iso press down 2x12 cues for breath control and posture HEP review/update  Therapeutic Activity: Fwd retro walking 4 laps // bars SBA, cues for posture, no breaks Sidestepping in // bars SBA 4 laps  Obstacle course figure 8, 6 cones w/ yellow/green/red cones (stop, slow, go), 3 laps w/ rest in between, CGA  [facility]                                                [facility]: [facility] Therapeutic Exercise: NustepL5 537m LE/UE during subjective Fwd/retro walking 5 laps in // bars cues for posture and mechanics Sidestepping in // bars 5 laps cues for posture/mechanics 4inch step up 3x5 each LE within // bars cues for form and control Triple flex + extension 3x5 each LE within // bars, cues for form and control upright posture Seated  adductor iso x25 cues for form and pacing      PATIENT EDUCATION:  Education details: rationale for interventions Person educated: Patient Education method: Explanation, Demonstration, Tactile cues, Verbal cues Education comprehension: verbalized understanding, returned demonstration, verbal cues required, tactile cues required, and needs further education    HOME EXERCISE PROGRAM: Access Code: [email or web address] URL: [email or web address] Date: 03/18/2022 Prepared by: DaEnis SlipperExercises - Sit to Stand with Armchair  - 1 x daily - 7 x weekly - 2 sets - 10 reps - Standing March with Counter Support  - 1 x daily - 7 x weekly - 2 sets - 8 reps - Standing Hip Extension with Counter Support  - 1 x daily - 7 x weekly - 2 sets - 8 reps - Heel Raises with Counter Support  - 1 x daily - 7 x weekly - 2 sets - 10 reps   ASSESSMENT:   CLINICAL IMPRESSION: ***  ***  Pt arrives w/ primary report of stiffness due to cold weather. He is ambulating with tall cane/ walking stick today. Introduced trekking poles as a possible AD. He reported a positive response with gait using trekking poles in clinic and achieved more erect posture. Captured 2 MWT today with pt achieving 295 feet using SPC and pain increased from 5/10 to 7/10. His LE MMT has improved compared to initial measures. FOTO score improved minimally. He reports continued limitations with activity on his feet and uses Rolator intermittently with household/ kitchen activities. No additional LTGs met, however he is making slow progress toward remaining goals. He will benefit from continued PT to achieve LTGs and return to PLOF.       OBJECTIVE IMPAIRMENTS: Abnormal gait, decreased activity tolerance, decreased balance, decreased endurance, decreased mobility, difficulty walking, decreased ROM, decreased strength, hypomobility, improper body mechanics, postural dysfunction, and pain.    ACTIVITY LIMITATIONS: carrying, lifting,  bending, sitting, standing, squatting, sleeping, stairs, transfers, bed mobility, bathing, toileting, dressing, and locomotion level   PARTICIPATION LIMITATIONS: meal prep, cleaning, laundry, shopping, community activity, and yard work   PERSONAL FACTORS: Time since onset of injury/illness/exacerbation and 3+ comorbidities: CAD, PE, DVT, HTN  are also affecting patient's functional outcome.    REHAB POTENTIAL: Good   CLINICAL DECISION MAKING: Stable/uncomplicated   EVALUATION COMPLEXITY: Low     GOALS: Goals reviewed with patient? Yes   SHORT TERM GOALS: Target date: 02/25/2022   Pt will demonstrate appropriate understanding and performance of initially prescribed HEP in order to facilitate improved independence with management of symptoms.  Baseline: HEP provided on eval 03/04/22: Pt reports independence w/ HEP Goal status: MET   2. Pt will score greater than or equal to 42% on FOTO in order to demonstrate improved perception of function due to symptoms.            Baseline: 32% 03/04/22: 32% 03/31/22: 35%            Goal status: ONGOING   LONG TERM GOALS: Target date: 05/01/22 (updated 03/31/22)              Pt will score 51% on FOTO in order to demonstrate improved perception of functional status due to symptoms.  Baseline: 32% 03/31/22: 35% Goal status: ONGOING   2.  Pt will demonstrate ability to ambulate at least 1022f with LRAD and no more than 2pt increase in baseline pain on NPS in order to facilitate improved tolerance to community ambulation.  Baseline: pt unable to tolerate greater than household distances due to significant inc in pain 03/31/22: able to complete 2 minute walk test at 295 feet with increased pain from 5/10 to 7/10.  Goal status: ONGOING   3.  Pt will demonstrate hip flexion MMT of 5/5 in order to facilitate improved clearance with gait for reduced fall risk.  Baseline: 4/5 B w pain 03/31/21: see chart Goal status: PARTIALLY MET   4. Pt will be able to  perform TUG in less than or equal to 13 sec with LRAD in order to indicate reduced risk of falling (cutoff score for fall risk 13.5 sec in community dwelling older adults per [facility], 2000)            Baseline: 22sec with rollator  02/16/22: 12sec with rollator             Goal status: MET    PLAN (updated 03/31/22):   PT FREQUENCY: 1-2x/week   PT DURATION: 4 weeks   PLANNED  INTERVENTIONS: Therapeutic exercises, Therapeutic activity, Neuromuscular re-education, Balance training, Gait training, Patient/Family education, Self Care, Stair training, DME instructions, Aquatic Therapy, Cryotherapy, Moist heat, Taping, Manual therapy, and Re-evaluation.   PLAN FOR NEXT SESSION:     Progress functional mobility/strengthening exercises as able/appropriate, review/update HEP as appropriate.    Leeroy Cha PT, DPT 04/01/2022 1:23 PM

## 2022-04-02 ENCOUNTER — Ambulatory Visit: Payer: Commercial Managed Care - PPO | Admitting: Physical Therapy

## 2022-04-08 ENCOUNTER — Ambulatory Visit: Payer: Commercial Managed Care - PPO | Admitting: Physical Therapy

## 2022-04-10 ENCOUNTER — Ambulatory Visit: Payer: Commercial Managed Care - PPO | Admitting: Physical Therapy

## 2022-04-13 ENCOUNTER — Telehealth: Payer: Self-pay | Admitting: Physical Therapy

## 2022-04-13 NOTE — Telephone Encounter (Signed)
Patient states he is Covid positive and he will be in touch to reschedule as soon as he is feeling better. He does not want to share the germs.

## 2022-04-14 ENCOUNTER — Telehealth: Payer: Self-pay

## 2022-04-14 NOTE — Telephone Encounter (Signed)
Patient calls nurse line reporting he tested positive for covid yesterday.  Patient reports symptoms started Saturday with a runny nose, cough, nausea and vomiting and chest congestion.   He denies any fevers, body aches or SOB.   He reports his wife had it first and she "got over it pretty quickly."  Patient is requesting anti viral therapy. I did advise on 5 day window.   Will forward to PCP.

## 2022-04-16 ENCOUNTER — Other Ambulatory Visit (HOSPITAL_COMMUNITY): Payer: Self-pay

## 2022-04-16 ENCOUNTER — Other Ambulatory Visit: Payer: Self-pay

## 2022-04-16 MED ORDER — MOLNUPIRAVIR 200 MG PO CAPS
4.0000 | ORAL_CAPSULE | Freq: Two times a day (BID) | ORAL | 0 refills | Status: AC
  Filled 2022-04-16: qty 40, 5d supply, fill #0

## 2022-04-16 NOTE — Telephone Encounter (Signed)
Called to inquire about patient's COVID status/symptoms. Patient requesting antiviral meds. Patient is good candidate for Tx.   Will send in Rx for molnupiravir for 5 days to be taken twice a day at [place].

## 2022-04-17 ENCOUNTER — Other Ambulatory Visit (HOSPITAL_COMMUNITY): Payer: Self-pay

## 2022-04-17 MED ORDER — HYDROCHLOROTHIAZIDE 12.5 MG PO TABS
12.5000 mg | ORAL_TABLET | Freq: Every day | ORAL | 1 refills | Status: DC
  Filled 2022-04-17 – 2022-05-19 (×2): qty 90, 90d supply, fill #0

## 2022-04-20 ENCOUNTER — Other Ambulatory Visit (HOSPITAL_COMMUNITY): Payer: Self-pay

## 2022-04-23 ENCOUNTER — Encounter: Payer: Self-pay | Admitting: Physical Therapy

## 2022-04-23 ENCOUNTER — Ambulatory Visit: Payer: Commercial Managed Care - PPO | Admitting: Physical Therapy

## 2022-04-23 DIAGNOSIS — M5459 Other low back pain: Secondary | ICD-10-CM | POA: Diagnosis not present

## 2022-04-23 DIAGNOSIS — R2689 Other abnormalities of gait and mobility: Secondary | ICD-10-CM

## 2022-04-23 DIAGNOSIS — M6281 Muscle weakness (generalized): Secondary | ICD-10-CM | POA: Diagnosis not present

## 2022-04-23 NOTE — Therapy (Signed)
OUTPATIENT PHYSICAL THERAPY TREATMENT NOTE + RECERTIFICATION   [name] MRN: [id number] DOB:1966/01/04, 57 y.o., male Today's Date: 04/23/2022  PCP: Holley Bouche, MD   REFERRING PROVIDER: Judith Part, MD  END OF SESSION:   PT End of Session - 04/23/22 0931     Visit Number 9    Number of Visits 25    Date for PT Re-Evaluation 04/28/22    Authorization Type [place] [employer]    Authorization Time Period auth after 25th visit, FOTO v10    Authorization - Visit Number 9    Authorization - Number of Visits 25    PT Start Time [phone]    PT Stop Time 1015    PT Time Calculation (min) 44 min                  Past Medical History:  Diagnosis Date   Acute pulmonary embolus ([place])    bilateral submassive PE in setting of missing several doses of Xarelto for his DVT   Arthritis    DVT (deep venous thrombosis) ([place]) 11/28/2013   RT LEG   High cholesterol    Hypertension    Lumbar spine scoliosis    Marijuana use    Sleep apnea    Past Surgical History:  Procedure Laterality Date   ABDOMINAL EXPOSURE N/A 01/14/2022   Procedure: ABDOMINAL EXPOSURE;  Surgeon: Marty Heck, MD;  Location: [facility] OR;  Service: Vascular;  Laterality: N/A;   ANTERIOR LUMBAR FUSION Left 01/14/2022   Procedure: Lumbar three-four Lumbar four-five Oblique Lumbar Interbody Fusion;  Surgeon: Judith Part, MD;  Location: [place];  Service: Neurosurgery;  Laterality: Left;   HERNIA REPAIR     0973 and 5329; umbilical hernia repair   KNEE SURGERY Bilateral    Left knee 1993, right knee 2004   TONSILLECTOMY     TOTAL KNEE ARTHROPLASTY Left 02/10/2017   Procedure: LEFT TOTAL KNEE ARTHROPLASTY;  Surgeon: Leandrew Koyanagi, MD;  Location: [place];  Service: Orthopedics;  Laterality: Left;   TOTAL KNEE ARTHROPLASTY Right 07/28/2017   Procedure: RIGHT TOTAL KNEE ARTHROPLASTY;  Surgeon: Leandrew Koyanagi, MD;  Location: [place];  Service: Orthopedics;  Laterality: Right;    TRANSFORAMINAL LUMBAR INTERBODY FUSION W/ MIS 1 LEVEL N/A 01/14/2022   Procedure: OPEN TRANSFORAMINAL LUMBAR INTERBODY FUSION  LUMBAR FIVE-SACRAL ONE ,LAMINECTOMIES AND POSTERIOR LATERAL INSTRUMENTATION FUSION  LUMBAR THREE TO FIVE SACRAL ONE;  Surgeon: Judith Part, MD;  Location: [place];  Service: Neurosurgery;  Laterality: N/A;   Patient Active Problem List   Diagnosis Date Noted   Leg swelling 02/23/2022   Scoliosis 01/14/2022   Snoring 09/22/2021   Insomnia due to mental condition 09/22/2021   At risk for mood disturbance 09/01/2021   Sleep disturbance 07/28/2021   Chronic bilateral low back pain without sciatica 07/25/2019   Neurogenic claudication 05/19/2019   Hyperlipidemia LDL goal <70 07/20/2018   History of pulmonary embolus (PE) 07/20/2017   Mild CAD 07/20/2017   S/p total knee replacement, bilateral 02/10/2017   Chronic anticoagulation 01/26/2017   Pulmonary embolism ([place]) 06/03/2016   Encounter for chronic pain management 04/06/2014   Tobacco abuse 12/05/2013   DVT (deep venous thrombosis) ([place]) 11/28/2013   HTN (hypertension) 05/04/2013   Lumbar back pain 05/04/2013   BMI 33.0-33.9,adult 05/04/2013   Hx of substance abuse ([place]) 05/04/2013    REFERRING DIAG:  M54.50 (ICD-10-CM) - Lumbar back pain  THERAPY DIAG:  Other low back pain  Muscle  weakness (generalized)  Other abnormalities of gait and mobility  Rationale for Evaluation and Treatment Rehabilitation  PERTINENT HISTORY:  CAD, PE, DVT, HTN S/p lumbar interbody fusion 01/14/22  PRECAUTIONS: Back, abdominal and posterior incisions Per pt on 03/18/22, spine precautions lifted by surgeon at most recent visit     SUBJECTIVE:                                                                                                                                                                                      SUBJECTIVE STATEMENT:  I had COVID and it took me down. The back is still painful. Pain is  8/10. I use the rollator full time still.     PAIN:  Are you having pain: 8/10 Location: low back How would you describe your pain? Stiff, achey Best in past week: 5/10 with movement, no resting pain at best Worst in past week: 11/10 Aggravating factors: walking/standing household distances, prolonged sitting Easing factors: rest breaks, ice    OBJECTIVE: (objective measures completed at initial evaluation unless otherwise dated)    DIAGNOSTIC FINDINGS:  Post op interbody fusion lumbar   PATIENT SURVEYS:  FOTO 32% FOTO 03/04/22 32% FOTO 03/31/22 35%   COGNITION: Overall cognitive status: Within functional limits for tasks assessed                          SENSATION: Light touch intact B LEs     POSTURE: increased kyphosis and forward head posture, UT elevation   PALPATION/OBSERVATION: Deferred as pt wears back brace to eval - states he saw MD this morning and no concerns; abdominal incision healed and posterior incision with mild drainage but WNL per pt report   LUMBAR ROM:    AROM 03/18/22  Flexion  75% distal to knee, mild pain  Extension  25% p!  Right lateral flexion    Left lateral flexion    Right rotation 50% stiffness  Left rotation  50% stiffness   (Blank rows = not tested) Comments: Deferred on eval given spine precautions     LOWER EXTREMITY MMT:     MMT Right eval Left eval Right 03/31/22 Left 03/31/22  Hip flexion 4p! 4p! 4+ P 4+  Hip abduction (modified sitting) 4+ 4+ 4+ 4+  Hip internal rotation        Hip external rotation     4- 4  Knee flexion 5 4+ 5 5  Knee extension 4+ 4+ 5 5   (Blank rows = not tested)   Comments: mild pulling in hamstring with knee ext on R, no pain with resistance; mild pain with hip flex  B that resolves with cessation of movement       FUNCTIONAL TESTS:  Sit to stand transfer, standard chair: heavy use of UE on rollator and chair, reduced fwd weight shift, wide BOS, significantly inc time and effort   TUG: 22  seconds from standard chair w/ rollator, gait mechanics as below, heavy UE use  02/16/22: TUG 12sec standard chair with rollator, continues with impaired gait mechanics and heavy UE use, fwd flexed posture  2 minute walk test: 03/31/22: 295 Feet    BERG BALANCE: 43/56  Sitting to Standing: Numbers; 0-4: 3  4. Stands without using hands and stabilize independently  3. Stands independently using hands  2. Stands using hands after multiple trials  1. Min A to stand  0. Mod-Max A to stand Standing unsupported: Numbers; 0-4: 4  4. Stands safely for 2 minutes  3. Stands 2 minutes with supervision  2. Stands 30 seconds unsupported  1. Needs several tries to stand unsupported for 30 seconds  0. Unable to stand unsupported for 30 seconds Sitting unsupported: Numbers; 0-4: 4  4. Sits for 2 minutes independently  3. Sits for 2 minutes with supervision  2. Able to sit 30 seconds  1. Able to sit 10 seconds  0. Unable to sit for 10 seconds Standing to Sitting: Numbers; 0-4: 3 4. Sits safely with minimal use of hands 3. Controls descent with hands 2. Uses back of legs against chair to control descent 1. Sits independently, but uncontrolled descent 0. Needs assistance Transfers: Numbers; 0-4: 3  4. Transfers safely with minor use of hands  3. Transfers safely definite use of hands  2. Transfers with verbal cueing/supervision  1. Needs 1 person assist  0. Needs 2 person assist  Standing with eyes closed: Numbers; 0-4: 4  4. Stands safely for 10 seconds  3. Stands 10 seconds with supervision   2. Able to stand for 3 seconds  1. Unable to keep eyes closed for 3 seconds, but is safe  0. Needs assist to keep from falling Standing with feet together: Numbers; 0-4: 4 4. Stands for 1 minute safely 3. Stands for 1 minute with supervision 2. Unable to hold for 30 seconds  1. Needs help to attain position but can hold for 15 seconds  0. Needs help to attain position and unable to hold for 15  seconds Reaching forward with outstretched arm: Numbers; 0-4: 3  4. Reaches forward 10 inches  3. Reaches forward 5 inches  2. Reaches forward 2 inches  1. Reaches forward with supervision  0. Loses balance/requires assistace Retrieving object from the floor: Numbers; 0-4: 3 4. Able to pick up easily and safely 3. Able to pick up with supervision 2. Unable to pick up, but reaches within 1-2 inches independently 1. Unable to pick up and needs supervision 0. Unable/needs assistance to keep from falling  Turning to look behind: Numbers; 0-4: 2  4. Looks behind from both sides and weight shifts well  3. Looks behind one side only, other side less weight shift  2. Turns sideways only, maintains balance  1. Needs supervision when turning  0. Needs assistance  Turning 360 degrees: Numbers; 0-4: 2  4. Able to turn in </=4 seconds  3. Able to turn on one side in </= 4 seconds   2. Able to turn slowly, but safely  1. Needs supervision or verbal cueing  0. Needs assistance Place alternate foot on stool: Numbers; 0-4: 4 4. Completes 8 steps in  20 seconds 3. Completes 8 steps in >20 seconds 2. 4 steps without assistance/supervision 1. Completes >2 steps with minimal assist 0. Unable, needs assist to keep from falling Standing with one foot in front: Numbers; 0-4: 1  4. Independent tandem for 30 seconds  3. Independent foot ahead for 30 seconds  2. Independent small step for 30 seconds  1. Needs help to step, but can hold for 15 seconds  0. Loses balance while standing/stepping Standing on one foot: Numbers; 0-4: 3 4. Holds >10 seconds 3. Holds 5-10 seconds 2. Holds >/=3 seconds  1. Holds <3 seconds 0. Unable   Total Score: 43/56     GAIT: Distance walked: within clinic Assistive device utilized: rollator Level of assistance: Modified independence Comments: significant forward flexed posture, partial step through pattern with intermittent step to, reduced hip extension B,  reduced knee ROM throughout swing/stance   TODAY'S TREATMENT:                                                                                                  [facility] Adult PT Treatment:                                                DATE: 04/23/22 Therapeutic Exercise: Nustep L6 UE/LE x 5 minutes  Therapeutic Activity: Side stepping in // bars Retro walking in // bars Slow marching for SLS without UE BERG 43/56   [facility] Adult PT Treatment:                                                DATE: 03/31/21 Therapeutic Exercise: Nustep L6 5 minutes UE/LE  Supine Marching 10 x 2 with cues for lumbar stabilization. Hooklying gluteal squeeze 5 sec x 10 Mini Bridge 8 reps x 2 sets  Feet on exercise ball: knee flexion and LTR - cues for ab brace and controlled movement.  Therapeutic Activity:  2 MWT : 295 Feet with SPC Gait with bilat trekking poles 100 feet as a trial      [facility] Adult PT Treatment:                                           DATE: 03/18/22 Therapeutic Exercise: Nustep L5 9mn LE/UE during subjective Swiss ball flexion, seated, x10, cues for breath control and appropriate ROM  Swiss ball iso press down 2x12 cues for breath control and posture HEP review/update  Therapeutic Activity: Fwd retro walking 4 laps // bars SBA, cues for posture, no breaks Sidestepping in // bars SBA 4 laps  Obstacle course figure 8, 6 cones w/ yellow/green/red cones (stop, slow, go), 3 laps w/ rest in between, CGA  [facility]  [facility]: 03/04/22 Therapeutic Exercise: NustepL5 64mn LE/UE during subjective Fwd/retro walking 5 laps in // bars cues for posture and mechanics Sidestepping in // bars 5 laps cues for posture/mechanics 4inch step up 3x5 each LE within // bars cues for form and control Triple flex + extension 3x5 each LE within // bars, cues for form and control upright posture Seated adductor iso x25 cues for form and pacing  [facility]                                                [facility]: 02/23/22 Therapeutic Exercise: Nustep L5 511m LE/UE during subjective Adductor iso x20 seated Standing marches 2x8 each LE in // bars, cues for form and pacing  Standing hip ext // bars 2x8 each LE, cues for upright posture and hip squeeze Standing heel raise // bars 2x12 cues for posture and pacing  Fwd/retro walking in // bars 3 laps, cues for upright posture and mechanics Mini squat // bars x12 cues for form      PATIENT EDUCATION:  Education details: rationale for interventions Person educated: Patient Education method: Explanation, Demonstration, Tactile cues, Verbal cues Education comprehension: verbalized understanding, returned demonstration, verbal cues required, tactile cues required, and needs further education    HOME EXERCISE PROGRAM: Access Code: [email or web address] URL: [email or web address] Date: 03/18/2022 Prepared by: DaEnis SlipperExercises - Sit to Stand with Armchair  - 1 x daily - 7 x weekly - 2 sets - 10 reps - Standing March with Counter Support  - 1 x daily - 7 x weekly - 2 sets - 8 reps - Standing Hip Extension with Counter Support  - 1 x daily - 7 x weekly - 2 sets - 8 reps - Heel Raises with Counter Support  - 1 x daily - 7 x weekly - 2 sets - 10 reps   ASSESSMENT:   CLINICAL IMPRESSION: Pt arrives after bout of COVID with onset 04/11/22. He reports increased back pain due to sickness, decreased activity and colder weather. His pain is 8/10 and he arrives with rolator which he continues to use full time due to balance concerns and low back pain. [facility]. Today captured BERG at 43/56. Significant ROM imitations in back and hips impacting mobility and transfers. Worked on stretching hip rotators and lumbar flexion. He tolerated session well without adverse effects. He has missed several weeks due to personal issues and sickness. Will see  primary PT next visit to assess potential for extended POC.      OBJECTIVE IMPAIRMENTS: Abnormal gait, decreased activity tolerance, decreased balance, decreased endurance, decreased mobility, difficulty walking, decreased ROM, decreased strength, hypomobility, improper body mechanics, postural dysfunction, and pain.    ACTIVITY LIMITATIONS: carrying, lifting, bending, sitting, standing, squatting, sleeping, stairs, transfers, bed mobility, bathing, toileting, dressing, and locomotion level   PARTICIPATION LIMITATIONS: meal prep, cleaning, laundry, shopping, community activity, and yard work   PERSONAL FACTORS: Time since onset of injury/illness/exacerbation and 3+ comorbidities: CAD, PE, DVT, HTN  are also affecting patient's functional outcome.    REHAB POTENTIAL: Good   CLINICAL DECISION MAKING: Stable/uncomplicated   EVALUATION COMPLEXITY: Low     GOALS: Goals reviewed with patient? Yes   SHORT TERM GOALS: Target date: 02/25/2022   Pt will demonstrate appropriate understanding and performance of initially prescribed  HEP in order to facilitate improved independence with management of symptoms.  Baseline: HEP provided on eval 03/04/22: Pt reports independence w/ HEP Goal status: MET   2. Pt will score greater than or equal to 42% on FOTO in order to demonstrate improved perception of function due to symptoms.            Baseline: 32% 03/04/22: 32% 03/31/22: 35%            Goal status: ONGOING   LONG TERM GOALS: Target date: 05/01/22 (updated 03/31/22)              Pt will score 51% on FOTO in order to demonstrate improved perception of functional status due to symptoms.  Baseline: 32% 03/31/22: 35% Goal status: ONGOING   2.  Pt will demonstrate ability to ambulate at least 1011f with LRAD and no more than 2pt increase in baseline pain on NPS in order to facilitate improved tolerance to community ambulation.  Baseline: pt unable to tolerate greater than household distances due to  significant inc in pain 03/31/22: able to complete 2 minute walk test at 295 feet with increased pain from 5/10 to 7/10.  Goal status: ONGOING   3.  Pt will demonstrate hip flexion MMT of 5/5 in order to facilitate improved clearance with gait for reduced fall risk.  Baseline: 4/5 B w pain 03/31/21: see chart Goal status: PARTIALLY MET   4. Pt will be able to perform TUG in less than or equal to 13 sec with LRAD in order to indicate reduced risk of falling (cutoff score for fall risk 13.5 sec in community dwelling older adults [facility] 2000)            Baseline: 22sec with rollator  02/16/22: 12sec with rollator             Goal status: MET    PLAN (updated 03/31/22):   PT FREQUENCY: 1-2x/week   PT DURATION: 4 weeks   PLANNED INTERVENTIONS: Therapeutic exercises, Therapeutic activity, Neuromuscular re-education, Balance training, Gait training, Patient/Family education, Self Care, Stair training, DME instructions, Aquatic Therapy, Cryotherapy, Moist heat, Taping, Manual therapy, and Re-evaluation.   PLAN FOR NEXT SESSION:   Re-eval, consider BERG goal and ROM goal.   Progress functional mobility/strengthening exercises as able/appropriate, review/update HEP as appropriate.    JHessie Diener PTA 04/23/22 1:14 PM Phone: [phone]: [phone]

## 2022-04-29 ENCOUNTER — Ambulatory Visit: Payer: Commercial Managed Care - PPO | Admitting: Physical Therapy

## 2022-04-29 ENCOUNTER — Encounter: Payer: Self-pay | Admitting: Physical Therapy

## 2022-04-29 DIAGNOSIS — M5459 Other low back pain: Secondary | ICD-10-CM | POA: Diagnosis not present

## 2022-04-29 DIAGNOSIS — R2689 Other abnormalities of gait and mobility: Secondary | ICD-10-CM

## 2022-04-29 DIAGNOSIS — M6281 Muscle weakness (generalized): Secondary | ICD-10-CM | POA: Diagnosis not present

## 2022-04-29 NOTE — Therapy (Signed)
OUTPATIENT PHYSICAL THERAPY PROGRESS NOTE + RECERTIFICATION   [name] MRN: [id number] DOB:Jan 08, 1966, 57 y.o., male Today's Date: 04/29/2022  Progress Note Reporting Period 01/28/22 to 04/29/22  See note below for Objective Data and Assessment of Progress/Goals.     PCP: Holley Bouche, MD   REFERRING PROVIDER: Judith Part, MD  END OF SESSION:   PT End of Session - 04/29/22 0930     Visit Number 10    Number of Visits 25    Date for PT Re-Evaluation 06/24/22    Authorization Type [place] [employer]    Authorization Time Period auth after 25th visit, FOTO v10    Authorization - Visit Number 10    Authorization - Number of Visits 25    Progress Note Due on Visit 20    PT Start Time 0931    PT Stop Time 1002   pt requests shortened session due to son's appointment   PT Time Calculation (min) 31 min    Activity Tolerance Patient tolerated treatment well;No increased pain    Behavior During Therapy WFL for tasks assessed/performed             Past Medical History:  Diagnosis Date   Acute pulmonary embolus (HCC)    bilateral submassive PE in setting of missing several doses of Xarelto for his DVT   Arthritis    DVT (deep venous thrombosis) ([place]) 11/28/2013   RT LEG   High cholesterol    Hypertension    Lumbar spine scoliosis    Marijuana use    Sleep apnea    Past Surgical History:  Procedure Laterality Date   ABDOMINAL EXPOSURE N/A 01/14/2022   Procedure: ABDOMINAL EXPOSURE;  Surgeon: Marty Heck, MD;  Location: [facility];  Service: Vascular;  Laterality: N/A;   ANTERIOR LUMBAR FUSION Left 01/14/2022   Procedure: Lumbar three-four Lumbar four-five Oblique Lumbar Interbody Fusion;  Surgeon: Judith Part, MD;  Location: [facility];  Service: Neurosurgery;  Laterality: Left;   HERNIA REPAIR     6789 and 3810; umbilical hernia repair   KNEE SURGERY Bilateral    Left knee 1993, right knee 2004   TONSILLECTOMY     TOTAL KNEE  ARTHROPLASTY Left 02/10/2017   Procedure: LEFT TOTAL KNEE ARTHROPLASTY;  Surgeon: Leandrew Koyanagi, MD;  Location: [facility];  Service: Orthopedics;  Laterality: Left;   TOTAL KNEE ARTHROPLASTY Right 07/28/2017   Procedure: RIGHT TOTAL KNEE ARTHROPLASTY;  Surgeon: Leandrew Koyanagi, MD;  Location: [facility];  Service: Orthopedics;  Laterality: Right;   TRANSFORAMINAL LUMBAR INTERBODY FUSION W/ MIS 1 LEVEL N/A 01/14/2022   Procedure: OPEN TRANSFORAMINAL LUMBAR INTERBODY FUSION  LUMBAR FIVE-SACRAL ONE ,LAMINECTOMIES AND POSTERIOR LATERAL INSTRUMENTATION FUSION  LUMBAR THREE TO FIVE SACRAL ONE;  Surgeon: Judith Part, MD;  Location: [place];  Service: Neurosurgery;  Laterality: N/A;   Patient Active Problem List   Diagnosis Date Noted   Leg swelling 02/23/2022   Scoliosis 01/14/2022   Snoring 09/22/2021   Insomnia due to mental condition 09/22/2021   At risk for mood disturbance 09/01/2021   Sleep disturbance 07/28/2021   Chronic bilateral low back pain without sciatica 07/25/2019   Neurogenic claudication 05/19/2019   Hyperlipidemia LDL goal <70 07/20/2018   History of pulmonary embolus (PE) 07/20/2017   Mild CAD 07/20/2017   S/p total knee replacement, bilateral 02/10/2017   Chronic anticoagulation 01/26/2017   Pulmonary embolism ([place]) 06/03/2016   Encounter for chronic pain management 04/06/2014   Tobacco  abuse 12/05/2013   DVT (deep venous thrombosis) ([place]) 11/28/2013   HTN (hypertension) 05/04/2013   Lumbar back pain 05/04/2013   BMI 33.0-33.9,adult 05/04/2013   Hx of substance abuse ([place]) 05/04/2013    REFERRING DIAG:  M54.50 (ICD-10-CM) - Lumbar back pain  THERAPY DIAG:  Other low back pain  Muscle weakness (generalized)  Other abnormalities of gait and mobility  Rationale for Evaluation and Treatment Rehabilitation  PERTINENT HISTORY:  CAD, PE, DVT, HTN S/p lumbar interbody fusion 01/14/22  PRECAUTIONS: Back, abdominal and posterior incisions Per pt on 03/18/22, spine  precautions lifted by surgeon at most recent visit     SUBJECTIVE:                                                                                                                                                                                      SUBJECTIVE STATEMENT:  Pt arrives w/ 4/10 pain, back brace donned. Pt reports setback after bout of COVID. Sees surgeon again in March. Pt states he wants to be able to get back to doing regular household activities     PAIN:  Are you having pain: 4/10 Location: low back How would you describe your pain? Stiff, achey Best in past week: 4/10 with movement, no resting pain at best Worst in past week: 8/10 Aggravating factors: walking/standing household distances, prolonged sitting Easing factors: rest breaks, ice    OBJECTIVE: (objective measures completed at initial evaluation unless otherwise dated)    DIAGNOSTIC FINDINGS:  Post op interbody fusion lumbar   PATIENT SURVEYS:  FOTO 32% FOTO 03/04/22 32% FOTO 03/31/22 35% 04/29/22: 37%    COGNITION: Overall cognitive status: Within functional limits for tasks assessed                          SENSATION: Light touch intact B LEs     POSTURE: increased kyphosis and forward head posture, UT elevation   PALPATION/OBSERVATION: Deferred as pt wears back brace to eval - states he saw MD this morning and no concerns; abdominal incision healed and posterior incision with mild drainage but WNL per pt report   LUMBAR ROM:    AROM 03/18/22  Flexion  75% distal to knee, mild pain  Extension  25% p!  Right lateral flexion    Left lateral flexion    Right rotation 50% stiffness  Left rotation  50% stiffness   (Blank rows = not tested) Comments: Deferred on eval given spine precautions 04/29/22: Deferred given time constraints and pt arriving with back brace donned     LOWER EXTREMITY MMT:     MMT Right eval Left eval  Right 03/31/22 Left 03/31/22 R/L 04/29/22  Hip flexion 4p! 4p! 4+ P  4+ 5/5 (stiffness R side)  Hip abduction (modified sitting) 4+ 4+ 4+ 4+ 4+/4+  Hip internal rotation         Hip external rotation     4- 4 4+/4+  Knee flexion 5 4+ 5 5   Knee extension 4+ 4+ 5 5    (Blank rows = not tested)   Comments: mild pulling in hamstring with knee ext on R, no pain with resistance; mild pain with hip flex B that resolves with cessation of movement       FUNCTIONAL TESTS:  Sit to stand transfer, standard chair: heavy use of UE on rollator and chair, reduced fwd weight shift, wide BOS, significantly inc time and effort   TUG: 22 seconds from standard chair w/ rollator, gait mechanics as below, heavy UE use  02/16/22: TUG 12sec standard chair with rollator, continues with impaired gait mechanics and heavy UE use, fwd flexed posture  2 minute walk test: 03/31/22: 295 Feet  2MWT 04/29/22: 456f w/ rollator  3867fgait no AD, RPE 5/10, limited by fatigue, SBA     BERG BALANCE: 43/56 (04/23/22)  Sitting to Standing: Numbers; 0-4: 3  4. Stands without using hands and stabilize independently  3. Stands independently using hands  2. Stands using hands after multiple trials  1. Min A to stand  0. Mod-Max A to stand Standing unsupported: Numbers; 0-4: 4  4. Stands safely for 2 minutes  3. Stands 2 minutes with supervision  2. Stands 30 seconds unsupported  1. Needs several tries to stand unsupported for 30 seconds  0. Unable to stand unsupported for 30 seconds Sitting unsupported: Numbers; 0-4: 4  4. Sits for 2 minutes independently  3. Sits for 2 minutes with supervision  2. Able to sit 30 seconds  1. Able to sit 10 seconds  0. Unable to sit for 10 seconds Standing to Sitting: Numbers; 0-4: 3 4. Sits safely with minimal use of hands 3. Controls descent with hands 2. Uses back of legs against chair to control descent 1. Sits independently, but uncontrolled descent 0. Needs assistance Transfers: Numbers; 0-4: 3  4. Transfers safely with minor use of hands  3.  Transfers safely definite use of hands  2. Transfers with verbal cueing/supervision  1. Needs 1 person assist  0. Needs 2 person assist  Standing with eyes closed: Numbers; 0-4: 4  4. Stands safely for 10 seconds  3. Stands 10 seconds with supervision   2. Able to stand for 3 seconds  1. Unable to keep eyes closed for 3 seconds, but is safe  0. Needs assist to keep from falling Standing with feet together: Numbers; 0-4: 4 4. Stands for 1 minute safely 3. Stands for 1 minute with supervision 2. Unable to hold for 30 seconds  1. Needs help to attain position but can hold for 15 seconds  0. Needs help to attain position and unable to hold for 15 seconds Reaching forward with outstretched arm: Numbers; 0-4: 3  4. Reaches forward 10 inches  3. Reaches forward 5 inches  2. Reaches forward 2 inches  1. Reaches forward with supervision  0. Loses balance/requires assistace Retrieving object from the floor: Numbers; 0-4: 3 4. Able to pick up easily and safely 3. Able to pick up with supervision 2. Unable to pick up, but reaches within 1-2 inches independently 1. Unable to pick up and needs supervision 0. Unable/needs  assistance to keep from falling  Turning to look behind: Numbers; 0-4: 2  4. Looks behind from both sides and weight shifts well  3. Looks behind one side only, other side less weight shift  2. Turns sideways only, maintains balance  1. Needs supervision when turning  0. Needs assistance  Turning 360 degrees: Numbers; 0-4: 2  4. Able to turn in </=4 seconds  3. Able to turn on one side in </= 4 seconds   2. Able to turn slowly, but safely  1. Needs supervision or verbal cueing  0. Needs assistance Place alternate foot on stool: Numbers; 0-4: 4 4. Completes 8 steps in 20 seconds 3. Completes 8 steps in >20 seconds 2. 4 steps without assistance/supervision 1. Completes >2 steps with minimal assist 0. Unable, needs assist to keep from falling Standing with one foot in  front: Numbers; 0-4: 1  4. Independent tandem for 30 seconds  3. Independent foot ahead for 30 seconds  2. Independent small step for 30 seconds  1. Needs help to step, but can hold for 15 seconds  0. Loses balance while standing/stepping Standing on one foot: Numbers; 0-4: 3 4. Holds >10 seconds 3. Holds 5-10 seconds 2. Holds >/=3 seconds  1. Holds <3 seconds 0. Unable   Total Score: 43/56     GAIT: Distance walked: within clinic Assistive device utilized: rollator Level of assistance: Modified independence Comments: significant forward flexed posture, partial step through pattern with intermittent step to, reduced hip extension B, reduced knee ROM throughout swing/stance   TODAY'S TREATMENT:                                                                                                 [facility] Adult PT Treatment:                                                DATE: 04/29/22 Therapeutic Activity: FOTO + education MSK assessment + education 2MWT + education Functional mobility assessment + education Education re: AD use, pacing of activities and mechanics    [facility] Adult PT Treatment:                                                DATE: 04/23/22 Therapeutic Exercise: Nustep L6 UE/LE x 5 minutes  Therapeutic Activity: Side stepping in // bars Retro walking in // bars Slow marching for SLS without UE BERG 43/56   [facility] Adult PT Treatment:                                                DATE: 03/31/21 Therapeutic Exercise: Nustep L6 5 minutes UE/LE  Supine Marching 10 x 2 with cues for lumbar  stabilization. Hooklying gluteal squeeze 5 sec x 10 Mini Bridge 8 reps x 2 sets  Feet on exercise ball: knee flexion and LTR - cues for ab brace and controlled movement.  Therapeutic Activity:  2 MWT : 295 Feet with SPC Gait with bilat trekking poles 100 feet as a trial      [facility] Adult PT Treatment:                                           DATE: 03/18/22 Therapeutic Exercise: Nustep  L5 10mn LE/UE during subjective Swiss ball flexion, seated, x10, cues for breath control and appropriate ROM  Swiss ball iso press down 2x12 cues for breath control and posture HEP review/update  Therapeutic Activity: Fwd retro walking 4 laps // bars SBA, cues for posture, no breaks Sidestepping in // bars SBA 4 laps  Obstacle course figure 8, 6 cones w/ yellow/green/red cones (stop, slow, go), 3 laps w/ rest in between, CGA   PATIENT EDUCATION:  Education details: rationale for interventions Person educated: Patient Education method: Explanation, Demonstration, Tactile cues, Verbal cues Education comprehension: verbalized understanding, returned demonstration, verbal cues required, tactile cues required, and needs further education    HOME EXERCISE PROGRAM: Access Code: [email or web address] URL: [email or web address] Date: 03/18/2022 Prepared by: DEnis Slipper Exercises - Sit to Stand with Armchair  - 1 x daily - 7 x weekly - 2 sets - 10 reps - Standing March with Counter Support  - 1 x daily - 7 x weekly - 2 sets - 8 reps - Standing Hip Extension with Counter Support  - 1 x daily - 7 x weekly - 2 sets - 8 reps - Heel Raises with Counter Support  - 1 x daily - 7 x weekly - 2 sets - 10 reps   ASSESSMENT:   CLINICAL IMPRESSION: Pt arrives for 10th visit since initiating POC on 01/28/22 - adherence to POC has been limited due to difficulty with transportation and recent bout of COVID. Today looked at goals, pt has made good progress overall despite difficulty adhering to original plan of care, as evidenced by improved 2MWT using rollator and increased LE strength. Pt continues with fluctuating pain levels and deficits in mobility, continues with increased trunk flexion and reduced LE extension in WB. Pt states his eventual goal is to wean from AD safely and be able to do household tasks without significant increases in pain. Given progress and anticipated potential for continued  improvement despite gaps in care, recommend continuing along POC as outlined below. Pt tolerates today's session well (shortened per pt request, son has appt), no adverse events. Pt departs today's session in no acute distress, all voiced questions/concerns addressed appropriately from PT perspective.       OBJECTIVE IMPAIRMENTS: Abnormal gait, decreased activity tolerance, decreased balance, decreased endurance, decreased mobility, difficulty walking, decreased ROM, decreased strength, hypomobility, improper body mechanics, postural dysfunction, and pain.    ACTIVITY LIMITATIONS: carrying, lifting, bending, sitting, standing, squatting, sleeping, stairs, transfers, bed mobility, bathing, toileting, dressing, and locomotion level   PARTICIPATION LIMITATIONS: meal prep, cleaning, laundry, shopping, community activity, and yard work   PERSONAL FACTORS: Time since onset of injury/illness/exacerbation and 3+ comorbidities: CAD, PE, DVT, HTN  are also affecting patient's functional outcome.    REHAB POTENTIAL: Good   CLINICAL DECISION MAKING: Stable/uncomplicated   EVALUATION COMPLEXITY: Low     GOALS: Goals reviewed  with patient? Yes   SHORT TERM GOALS: Target date: 02/25/2022   Pt will demonstrate appropriate understanding and performance of initially prescribed HEP in order to facilitate improved independence with management of symptoms.  Baseline: HEP provided on eval 03/04/22: Pt reports independence w/ HEP Goal status: MET   2. Pt will score greater than or equal to 42% on FOTO in order to demonstrate improved perception of function due to symptoms.            Baseline: 32% 03/04/22: 32% 03/31/22: 35% 04/29/22: 37%             Goal status: ONGOING   LONG TERM GOALS: Target date: 05/29/22 (updated 04/29/22)              Pt will score 51% on FOTO in order to demonstrate improved perception of functional status due to symptoms.  Baseline: 32% 03/31/22: 35% 04/29/22: 37%  Goal status:  ONGOING     2.  Pt will demonstrate ability to ambulate at least 1069f with LRAD and no more than 2pt increase in baseline pain on NPS in order to facilitate improved tolerance to community ambulation.  Baseline: pt unable to tolerate greater than household distances due to significant inc in pain 03/31/22: able to complete 2 minute walk test at 295 feet with increased pain from 5/10 to 7/10.  04/29/22: [id number]/ rollator no increase in pain 2MWT, significant fatigue; [id number] AD limited by fatigue    Goal status: PROGRESSING   3.  Pt will demonstrate hip flexion MMT of 5/5 in order to facilitate improved clearance with gait for reduced fall risk.  Baseline: 4/5 B w pain 03/31/21: see chart 04/29/22: see chart Goal status: MET   4. Pt will be able to perform TUG in less than or equal to 13 sec with LRAD in order to indicate reduced risk of falling (cutoff score for fall risk 13.5 sec in community dwelling older adults per [facility] al, 2000)            Baseline: 22sec with rollator  02/16/22: 12sec with rollator             Goal status: MET   5. Pt will score >47/56 on BERG to indicate reduced fall risk and increased safety w/ mobility.   04/23/22: 43/56  Goal status: NEW (04/29/22)   PLAN (updated 04/29/22):   PT FREQUENCY: 2x/week   PT DURATION: 4 weeks   PLANNED INTERVENTIONS: Therapeutic exercises, Therapeutic activity, Neuromuscular re-education, Balance training, Gait training, Patient/Family education, Self Care, Stair training, DME instructions, Aquatic Therapy, Cryotherapy, Moist heat, Taping, Manual therapy, and Re-evaluation.   PLAN FOR NEXT SESSION:   Review/update HEP (hip mobility). Gait/functional mobility. Consider DGI for higher level balance   DavLeeroy Cha, DPT 04/29/2022 10:21 AM

## 2022-05-11 ENCOUNTER — Ambulatory Visit: Payer: BLUE CROSS/BLUE SHIELD | Attending: Cardiology | Admitting: Physical Therapy

## 2022-05-11 ENCOUNTER — Encounter: Payer: Self-pay | Admitting: Physical Therapy

## 2022-05-11 DIAGNOSIS — R2689 Other abnormalities of gait and mobility: Secondary | ICD-10-CM | POA: Diagnosis present

## 2022-05-11 DIAGNOSIS — M6281 Muscle weakness (generalized): Secondary | ICD-10-CM | POA: Diagnosis present

## 2022-05-11 DIAGNOSIS — M5459 Other low back pain: Secondary | ICD-10-CM

## 2022-05-11 NOTE — Therapy (Signed)
OUTPATIENT PHYSICAL THERAPY PROGRESS NOTE    [name] MRN: [id number] DOB:07-Apr-1965, 57 y.o., male Today's Date: 05/11/2022      PCP: Holley Bouche, MD   REFERRING PROVIDER: Judith Part, MD  END OF SESSION:   PT End of Session - 05/11/22 0931     Visit Number 11    Number of Visits 25    Date for PT Re-Evaluation 06/24/22    Authorization Type [place] [employer]    Authorization Time Period auth after 25th visit, FOTO v10    Authorization - Visit Number 11    Authorization - Number of Visits 25    Progress Note Due on Visit 20    PT Start Time 0930    PT Stop Time [id number]   requested short session due to son's appt   PT Time Calculation (min) 25 min             Past Medical History:  Diagnosis Date   Acute pulmonary embolus ([place])    bilateral submassive PE in setting of missing several doses of Xarelto for his DVT   Arthritis    DVT (deep venous thrombosis) ([place]) 11/28/2013   RT LEG   High cholesterol    Hypertension    Lumbar spine scoliosis    Marijuana use    Sleep apnea    Past Surgical History:  Procedure Laterality Date   ABDOMINAL EXPOSURE N/A 01/14/2022   Procedure: ABDOMINAL EXPOSURE;  Surgeon: Marty Heck, MD;  Location: [place];  Service: Vascular;  Laterality: N/A;   ANTERIOR LUMBAR FUSION Left 01/14/2022   Procedure: Lumbar three-four Lumbar four-five Oblique Lumbar Interbody Fusion;  Surgeon: Judith Part, MD;  Location: [place];  Service: Neurosurgery;  Laterality: Left;   HERNIA REPAIR     [id number] and [id number]; umbilical hernia repair   KNEE SURGERY Bilateral    Left knee 1993, right knee 2004   TONSILLECTOMY     TOTAL KNEE ARTHROPLASTY Left 02/10/2017   Procedure: LEFT TOTAL KNEE ARTHROPLASTY;  Surgeon: Leandrew Koyanagi, MD;  Location: [place];  Service: Orthopedics;  Laterality: Left;   TOTAL KNEE ARTHROPLASTY Right 07/28/2017   Procedure: RIGHT TOTAL KNEE ARTHROPLASTY;  Surgeon: Leandrew Koyanagi, MD;  Location: [place];   Service: Orthopedics;  Laterality: Right;   TRANSFORAMINAL LUMBAR INTERBODY FUSION W/ MIS 1 LEVEL N/A 01/14/2022   Procedure: OPEN TRANSFORAMINAL LUMBAR INTERBODY FUSION  LUMBAR FIVE-SACRAL ONE ,LAMINECTOMIES AND POSTERIOR LATERAL INSTRUMENTATION FUSION  LUMBAR THREE TO FIVE SACRAL ONE;  Surgeon: Judith Part, MD;  Location: [place];  Service: Neurosurgery;  Laterality: N/A;   Patient Active Problem List   Diagnosis Date Noted   Leg swelling 02/23/2022   Scoliosis 01/14/2022   Snoring 09/22/2021   Insomnia due to mental condition 09/22/2021   At risk for mood disturbance 09/01/2021   Sleep disturbance 07/28/2021   Chronic bilateral low back pain without sciatica 07/25/2019   Neurogenic claudication 05/19/2019   Hyperlipidemia LDL goal <70 07/20/2018   History of pulmonary embolus (PE) 07/20/2017   Mild CAD 07/20/2017   S/p total knee replacement, bilateral 02/10/2017   Chronic anticoagulation 01/26/2017   Pulmonary embolism ([place]) 06/03/2016   Encounter for chronic pain management 04/06/2014   Tobacco abuse 12/05/2013   DVT (deep venous thrombosis) ([place]) 11/28/2013   HTN (hypertension) 05/04/2013   Lumbar back pain 05/04/2013   BMI 33.0-33.9,adult 05/04/2013   Hx of substance abuse ([place]) 05/04/2013    REFERRING DIAG:  M54.50 (  ICD-10-CM) - Lumbar back pain  THERAPY DIAG:  Other low back pain  Other abnormalities of gait and mobility  Muscle weakness (generalized)  Rationale for Evaluation and Treatment Rehabilitation  PERTINENT HISTORY:  CAD, PE, DVT, HTN S/p lumbar interbody fusion 01/14/22  PRECAUTIONS: Back, abdominal and posterior incisions Per pt on 03/18/22, spine precautions lifted by surgeon at most recent visit     SUBJECTIVE:                                                                                                                                                                                      SUBJECTIVE STATEMENT:  Pt arrives without back  brace and reports he wears it if standing prolonged or going out in community.     PAIN:  Are you having pain: 6/10 Location: low back How would you describe your pain? Stiff, achey Best in past week: 4/10 with movement, no resting pain at best Worst in past week: 8/10 Aggravating factors: walking/standing household distances, prolonged sitting Easing factors: rest breaks, ice    OBJECTIVE: (objective measures completed at initial evaluation unless otherwise dated)    DIAGNOSTIC FINDINGS:  Post op interbody fusion lumbar   PATIENT SURVEYS:  FOTO 32% FOTO 03/04/22 32% FOTO 03/31/22 35% 04/29/22: 37%    COGNITION: Overall cognitive status: Within functional limits for tasks assessed                          SENSATION: Light touch intact B LEs     POSTURE: increased kyphosis and forward head posture, UT elevation   PALPATION/OBSERVATION: Deferred as pt wears back brace to eval - states he saw MD this morning and no concerns; abdominal incision healed and posterior incision with mild drainage but WNL per pt report   LUMBAR ROM:    AROM 03/18/22  Flexion  75% distal to knee, mild pain  Extension  25% p!  Right lateral flexion    Left lateral flexion    Right rotation 50% stiffness  Left rotation  50% stiffness   (Blank rows = not tested) Comments: Deferred on eval given spine precautions 04/29/22: Deferred given time constraints and pt arriving with back brace donned     LOWER EXTREMITY MMT:     MMT Right eval Left eval Right 03/31/22 Left 03/31/22 R/L 04/29/22  Hip flexion 4p! 4p! 4+ P 4+ 5/5 (stiffness R side)  Hip abduction (modified sitting) 4+ 4+ 4+ 4+ 4+/4+  Hip internal rotation         Hip external rotation     4- 4 4+/4+  Knee flexion 5 4+ 5 5  Knee extension 4+ 4+ 5 5    (Blank rows = not tested)   Comments: mild pulling in hamstring with knee ext on R, no pain with resistance; mild pain with hip flex B that resolves with cessation of movement        FUNCTIONAL TESTS:  Sit to stand transfer, standard chair: heavy use of UE on rollator and chair, reduced fwd weight shift, wide BOS, significantly inc time and effort   TUG: 22 seconds from standard chair w/ rollator, gait mechanics as below, heavy UE use  02/16/22: TUG 12sec standard chair with rollator, continues with impaired gait mechanics and heavy UE use, fwd flexed posture  2 minute walk test: 03/31/22: 295 Feet  2MWT 04/29/22: 450f w/ rollator  [id number] no AD, RPE 5/10, limited by fatigue, SBA 05/11/22: Tandem stance 30 sec      BERG BALANCE: 43/56 (04/23/22)  Sitting to Standing: Numbers; 0-4: 3  4. Stands without using hands and stabilize independently  3. Stands independently using hands  2. Stands using hands after multiple trials  1. Min A to stand  0. Mod-Max A to stand Standing unsupported: Numbers; 0-4: 4  4. Stands safely for 2 minutes  3. Stands 2 minutes with supervision  2. Stands 30 seconds unsupported  1. Needs several tries to stand unsupported for 30 seconds  0. Unable to stand unsupported for 30 seconds Sitting unsupported: Numbers; 0-4: 4  4. Sits for 2 minutes independently  3. Sits for 2 minutes with supervision  2. Able to sit 30 seconds  1. Able to sit 10 seconds  0. Unable to sit for 10 seconds Standing to Sitting: Numbers; 0-4: 3 4. Sits safely with minimal use of hands 3. Controls descent with hands 2. Uses back of legs against chair to control descent 1. Sits independently, but uncontrolled descent 0. Needs assistance Transfers: Numbers; 0-4: 3  4. Transfers safely with minor use of hands  3. Transfers safely definite use of hands  2. Transfers with verbal cueing/supervision  1. Needs 1 person assist  0. Needs 2 person assist  Standing with eyes closed: Numbers; 0-4: 4  4. Stands safely for 10 seconds  3. Stands 10 seconds with supervision   2. Able to stand for 3 seconds  1. Unable to keep eyes closed for 3 seconds, but is safe  0.  Needs assist to keep from falling Standing with feet together: Numbers; 0-4: 4 4. Stands for 1 minute safely 3. Stands for 1 minute with supervision 2. Unable to hold for 30 seconds  1. Needs help to attain position but can hold for 15 seconds  0. Needs help to attain position and unable to hold for 15 seconds Reaching forward with outstretched arm: Numbers; 0-4: 3  4. Reaches forward 10 inches  3. Reaches forward 5 inches  2. Reaches forward 2 inches  1. Reaches forward with supervision  0. Loses balance/requires assistace Retrieving object from the floor: Numbers; 0-4: 3 4. Able to pick up easily and safely 3. Able to pick up with supervision 2. Unable to pick up, but reaches within 1-2 inches independently 1. Unable to pick up and needs supervision 0. Unable/needs assistance to keep from falling  Turning to look behind: Numbers; 0-4: 2  4. Looks behind from both sides and weight shifts well  3. Looks behind one side only, other side less weight shift  2. Turns sideways only, maintains balance  1. Needs supervision when turning  0. Needs assistance  Turning 360 degrees: Numbers; 0-4: 2  4. Able to turn in </=4 seconds  3. Able to turn on one side in </= 4 seconds   2. Able to turn slowly, but safely  1. Needs supervision or verbal cueing  0. Needs assistance Place alternate foot on stool: Numbers; 0-4: 4 4. Completes 8 steps in 20 seconds 3. Completes 8 steps in >20 seconds 2. 4 steps without assistance/supervision 1. Completes >2 steps with minimal assist 0. Unable, needs assist to keep from falling Standing with one foot in front: Numbers; 0-4: 1  4. Independent tandem for 30 seconds  3. Independent foot ahead for 30 seconds  2. Independent small step for 30 seconds  1. Needs help to step, but can hold for 15 seconds  0. Loses balance while standing/stepping Standing on one foot: Numbers; 0-4: 3 4. Holds >10 seconds 3. Holds 5-10 seconds 2. Holds >/=3 seconds  1.  Holds <3 seconds 0. Unable   Total Score: 43/56     GAIT: Distance walked: within clinic Assistive device utilized: rollator Level of assistance: Modified independence Comments: significant forward flexed posture, partial step through pattern with intermittent step to, reduced hip extension B, reduced knee ROM throughout swing/stance     [facility] Adult PT Treatment:                                                DATE: 05/11/22 Therapeutic Exercise: Nustep L 5 5 min Heel raises Alt hip flexion x 20 Hip abduction 10 x 2 Retro walking at counter 10 ft x 4 Green band row  x20 Shoulder ext Green x 20 Pilates Spring board pull downs x 10   Therapeutic Activity: Tandem stance 30 sec each AIREX marching without UE Airex narrow with head turns and trunk rotations    TODAY'S TREATMENT: [facility] Adult PT Treatment:                                                DATE: 04/29/22 Therapeutic Activity: FOTO + education MSK assessment + education 2MWT + education Functional mobility assessment + education Education re: AD use, pacing of activities and mechanics    [facility] Adult PT Treatment:                                                DATE: 04/23/22 Therapeutic Exercise: Nustep L6 UE/LE x 5 minutes  Therapeutic Activity: Side stepping in // bars Retro walking in // bars Slow marching for SLS without UE BERG 43/56   [facility] Adult PT Treatment:                                                DATE: 03/31/21 Therapeutic Exercise: Nustep L6 5 minutes UE/LE  Supine Marching 10 x 2 with cues for lumbar stabilization. Hooklying gluteal squeeze 5 sec x 10 Mini Bridge 8 reps x 2 sets  Feet on exercise ball: knee flexion and LTR - cues for  ab brace and controlled movement.  Therapeutic Activity:  2 MWT : 295 Feet with SPC Gait with bilat trekking poles 100 feet as a trial      [facility] Adult PT Treatment:                                           DATE: 03/18/22 Therapeutic Exercise: Nustep L5 28mn  LE/UE during subjective Swiss ball flexion, seated, x10, cues for breath control and appropriate ROM  Swiss ball iso press down 2x12 cues for breath control and posture HEP review/update  Therapeutic Activity: Fwd retro walking 4 laps // bars SBA, cues for posture, no breaks Sidestepping in // bars SBA 4 laps  Obstacle course figure 8, 6 cones w/ yellow/green/red cones (stop, slow, go), 3 laps w/ rest in between, CGA   PATIENT EDUCATION:  Education details: rationale for interventions Person educated: Patient Education method: Explanation, Demonstration, Tactile cues, Verbal cues Education comprehension: verbalized understanding, returned demonstration, verbal cues required, tactile cues required, and needs further education    HOME EXERCISE PROGRAM: Access Code: [email or web address] URL: [email or web address] Date: 03/18/2022 Prepared by: DEnis Slipper Exercises - Sit to Stand with Armchair  - 1 x daily - 7 x weekly - 2 sets - 10 reps - Standing March with Counter Support  - 1 x daily - 7 x weekly - 2 sets - 8 reps - Standing Hip Extension with Counter Support  - 1 x daily - 7 x weekly - 2 sets - 8 reps - Heel Raises with Counter Support  - 1 x daily - 7 x weekly - 2 sets - 10 reps   ASSESSMENT:   CLINICAL IMPRESSION: Pt reports no pain on arrival. Worked on closed chain tolerance and he was able to complete 20 minutes without seated break. Progressed core strength and balance therex with good tolerance. Short session due to patient needing to leave early. He reports minimal use of Rolator, using it for longer distances only.       OBJECTIVE IMPAIRMENTS: Abnormal gait, decreased activity tolerance, decreased balance, decreased endurance, decreased mobility, difficulty walking, decreased ROM, decreased strength, hypomobility, improper body mechanics, postural dysfunction, and pain.    ACTIVITY LIMITATIONS: carrying, lifting, bending, sitting, standing, squatting, sleeping,  stairs, transfers, bed mobility, bathing, toileting, dressing, and locomotion level   PARTICIPATION LIMITATIONS: meal prep, cleaning, laundry, shopping, community activity, and yard work   PERSONAL FACTORS: Time since onset of injury/illness/exacerbation and 3+ comorbidities: CAD, PE, DVT, HTN  are also affecting patient's functional outcome.    REHAB POTENTIAL: Good   CLINICAL DECISION MAKING: Stable/uncomplicated   EVALUATION COMPLEXITY: Low     GOALS: Goals reviewed with patient? Yes   SHORT TERM GOALS: Target date: 02/25/2022   Pt will demonstrate appropriate understanding and performance of initially prescribed HEP in order to facilitate improved independence with management of symptoms.  Baseline: HEP provided on eval 03/04/22: Pt reports independence w/ HEP Goal status: MET   2. Pt will score greater than or equal to 42% on FOTO in order to demonstrate improved perception of function due to symptoms.            Baseline: 32% 03/04/22: 32% 03/31/22: 35% 04/29/22: 37%             Goal status: ONGOING   LONG TERM GOALS: Target date: 05/29/22 (updated 04/29/22)  Pt will score 51% on FOTO in order to demonstrate improved perception of functional status due to symptoms.  Baseline: 32% 03/31/22: 35% 04/29/22: 37%  Goal status: ONGOING     2.  Pt will demonstrate ability to ambulate at least 101f with LRAD and no more than 2pt increase in baseline pain on NPS in order to facilitate improved tolerance to community ambulation.  Baseline: pt unable to tolerate greater than household distances due to significant inc in pain 03/31/22: able to complete 2 minute walk test at 295 feet with increased pain from 5/10 to 7/10.  04/29/22: [id number]/ rollator no increase in pain 2MWT, significant fatigue; [id number] AD limited by fatigue    Goal status: PROGRESSING   3.  Pt will demonstrate hip flexion MMT of 5/5 in order to facilitate improved clearance with gait for reduced fall risk.   Baseline: 4/5 B w pain 03/31/21: see chart 04/29/22: see chart Goal status: MET   4. Pt will be able to perform TUG in less than or equal to 13 sec with LRAD in order to indicate reduced risk of falling (cutoff score for fall risk 13.5 sec in community dwelling older adults [facility] 2000)            Baseline: 22sec with rollator  02/16/22: 12sec with rollator             Goal status: MET   5. Pt will score >47/56 on BERG to indicate reduced fall risk and increased safety w/ mobility.   04/23/22: 43/56  Goal status: NEW (04/29/22)   PLAN (updated 04/29/22):   PT FREQUENCY: 2x/week   PT DURATION: 4 weeks   PLANNED INTERVENTIONS: Therapeutic exercises, Therapeutic activity, Neuromuscular re-education, Balance training, Gait training, Patient/Family education, Self Care, Stair training, DME instructions, Aquatic Therapy, Cryotherapy, Moist heat, Taping, Manual therapy, and Re-evaluation.   PLAN FOR NEXT SESSION:   Review/update HEP (hip mobility). Gait/functional mobility. Consider DGI for higher level balance   JesHessie DienerTA 05/11/22 10:09 AM Phone: [phone]: [phone]

## 2022-05-12 ENCOUNTER — Ambulatory Visit: Payer: BLUE CROSS/BLUE SHIELD | Admitting: Physical Therapy

## 2022-05-12 DIAGNOSIS — M5459 Other low back pain: Secondary | ICD-10-CM | POA: Diagnosis not present

## 2022-05-12 DIAGNOSIS — M6281 Muscle weakness (generalized): Secondary | ICD-10-CM

## 2022-05-12 DIAGNOSIS — R2689 Other abnormalities of gait and mobility: Secondary | ICD-10-CM

## 2022-05-12 NOTE — Therapy (Signed)
OUTPATIENT PHYSICAL THERAPY PROGRESS NOTE    [name] MRN: [id number] DOB:22-Jan-1966, 57 y.o., male Today's Date: 05/12/2022      PCP: Holley Bouche, MD   REFERRING PROVIDER: Judith Part, MD  END OF SESSION:   PT End of Session - 05/12/22 0931     Visit Number 12    Number of Visits 25    Date for PT Re-Evaluation 06/24/22    Authorization Type [place] [employer]    Authorization Time Period auth after 25th visit, FOTO v10    Authorization - Visit Number 12    Progress Note Due on Visit 20    PT Start Time 0933    PT Stop Time 1011    PT Time Calculation (min) 38 min             Past Medical History:  Diagnosis Date   Acute pulmonary embolus ([place])    bilateral submassive PE in setting of missing several doses of Xarelto for his DVT   Arthritis    DVT (deep venous thrombosis) ([place]) 11/28/2013   RT LEG   High cholesterol    Hypertension    Lumbar spine scoliosis    Marijuana use    Sleep apnea    Past Surgical History:  Procedure Laterality Date   ABDOMINAL EXPOSURE N/A 01/14/2022   Procedure: ABDOMINAL EXPOSURE;  Surgeon: Marty Heck, MD;  Location: [facility];  Service: Vascular;  Laterality: N/A;   ANTERIOR LUMBAR FUSION Left 01/14/2022   Procedure: Lumbar three-four Lumbar four-five Oblique Lumbar Interbody Fusion;  Surgeon: Judith Part, MD;  Location: [facility];  Service: Neurosurgery;  Laterality: Left;   HERNIA REPAIR     [id number] and [id number]; umbilical hernia repair   KNEE SURGERY Bilateral    Left knee 1993, right knee 2004   TONSILLECTOMY     TOTAL KNEE ARTHROPLASTY Left 02/10/2017   Procedure: LEFT TOTAL KNEE ARTHROPLASTY;  Surgeon: Leandrew Koyanagi, MD;  Location: [facility];  Service: Orthopedics;  Laterality: Left;   TOTAL KNEE ARTHROPLASTY Right 07/28/2017   Procedure: RIGHT TOTAL KNEE ARTHROPLASTY;  Surgeon: Leandrew Koyanagi, MD;  Location: [facility];  Service: Orthopedics;  Laterality: Right;   TRANSFORAMINAL LUMBAR INTERBODY FUSION  W/ MIS 1 LEVEL N/A 01/14/2022   Procedure: OPEN TRANSFORAMINAL LUMBAR INTERBODY FUSION  LUMBAR FIVE-SACRAL ONE ,LAMINECTOMIES AND POSTERIOR LATERAL INSTRUMENTATION FUSION  LUMBAR THREE TO FIVE SACRAL ONE;  Surgeon: Judith Part, MD;  Location: [facility];  Service: Neurosurgery;  Laterality: N/A;   Patient Active Problem List   Diagnosis Date Noted   Leg swelling 02/23/2022   Scoliosis 01/14/2022   Snoring 09/22/2021   Insomnia due to mental condition 09/22/2021   At risk for mood disturbance 09/01/2021   Sleep disturbance 07/28/2021   Chronic bilateral low back pain without sciatica 07/25/2019   Neurogenic claudication 05/19/2019   Hyperlipidemia LDL goal <70 07/20/2018   History of pulmonary embolus (PE) 07/20/2017   Mild CAD 07/20/2017   S/p total knee replacement, bilateral 02/10/2017   Chronic anticoagulation 01/26/2017   Pulmonary embolism ([place]) 06/03/2016   Encounter for chronic pain management 04/06/2014   Tobacco abuse 12/05/2013   DVT (deep venous thrombosis) ([place]) 11/28/2013   HTN (hypertension) 05/04/2013   Lumbar back pain 05/04/2013   BMI 33.0-33.9,adult 05/04/2013   Hx of substance abuse ([place]) 05/04/2013    REFERRING DIAG:  M54.50 (ICD-10-CM) - Lumbar back pain  THERAPY DIAG:  Other low back pain  Muscle weakness (generalized)  Other abnormalities of gait and mobility  Rationale for Evaluation and Treatment Rehabilitation  PERTINENT HISTORY:  CAD, PE, DVT, HTN S/p lumbar interbody fusion 01/14/22  PRECAUTIONS: Back, abdominal and posterior incisions Per pt on 03/18/22, spine precautions lifted by surgeon at most recent visit     SUBJECTIVE:                                                                                                                                                                                      SUBJECTIVE STATEMENT:  I felt good when I left last time. I like those pulling exercises. I need more of those.     PAIN:  Are  you having pain: 5/10 Location: low back How would you describe your pain? Stiff, achey Best in past week: 4/10 with movement, no resting pain at best Worst in past week: 8/10 Aggravating factors: walking/standing household distances, prolonged sitting Easing factors: rest breaks, ice    OBJECTIVE: (objective measures completed at initial evaluation unless otherwise dated)    DIAGNOSTIC FINDINGS:  Post op interbody fusion lumbar   PATIENT SURVEYS:  FOTO 32% FOTO 03/04/22 32% FOTO 03/31/22 35% 04/29/22: 37%    COGNITION: Overall cognitive status: Within functional limits for tasks assessed                          SENSATION: Light touch intact B LEs     POSTURE: increased kyphosis and forward head posture, UT elevation   PALPATION/OBSERVATION: Deferred as pt wears back brace to eval - states he saw MD this morning and no concerns; abdominal incision healed and posterior incision with mild drainage but WNL per pt report   LUMBAR ROM:    AROM 03/18/22  Flexion  75% distal to knee, mild pain  Extension  25% p!  Right lateral flexion    Left lateral flexion    Right rotation 50% stiffness  Left rotation  50% stiffness   (Blank rows = not tested) Comments: Deferred on eval given spine precautions 04/29/22: Deferred given time constraints and pt arriving with back brace donned     LOWER EXTREMITY MMT:     MMT Right eval Left eval Right 03/31/22 Left 03/31/22 R/L 04/29/22  Hip flexion 4p! 4p! 4+ P 4+ 5/5 (stiffness R side)  Hip abduction (modified sitting) 4+ 4+ 4+ 4+ 4+/4+  Hip internal rotation         Hip external rotation     4- 4 4+/4+  Knee flexion 5 4+ 5 5   Knee extension 4+ 4+ 5 5    (Blank rows = not tested)   Comments:  mild pulling in hamstring with knee ext on R, no pain with resistance; mild pain with hip flex B that resolves with cessation of movement       FUNCTIONAL TESTS:  Sit to stand transfer, standard chair: heavy use of UE on rollator and chair,  reduced fwd weight shift, wide BOS, significantly inc time and effort   TUG: 22 seconds from standard chair w/ rollator, gait mechanics as below, heavy UE use  02/16/22: TUG 12sec standard chair with rollator, continues with impaired gait mechanics and heavy UE use, fwd flexed posture  2 minute walk test: 03/31/22: 295 Feet  2MWT 04/29/22: 418f w/ rollator  3815fgait no AD, RPE 5/10, limited by fatigue, SBA 05/11/22: Tandem stance 30 sec      BERG BALANCE: 43/56 (04/23/22)  Sitting to Standing: Numbers; 0-4: 3  4. Stands without using hands and stabilize independently  3. Stands independently using hands  2. Stands using hands after multiple trials  1. Min A to stand  0. Mod-Max A to stand Standing unsupported: Numbers; 0-4: 4  4. Stands safely for 2 minutes  3. Stands 2 minutes with supervision  2. Stands 30 seconds unsupported  1. Needs several tries to stand unsupported for 30 seconds  0. Unable to stand unsupported for 30 seconds Sitting unsupported: Numbers; 0-4: 4  4. Sits for 2 minutes independently  3. Sits for 2 minutes with supervision  2. Able to sit 30 seconds  1. Able to sit 10 seconds  0. Unable to sit for 10 seconds Standing to Sitting: Numbers; 0-4: 3 4. Sits safely with minimal use of hands 3. Controls descent with hands 2. Uses back of legs against chair to control descent 1. Sits independently, but uncontrolled descent 0. Needs assistance Transfers: Numbers; 0-4: 3  4. Transfers safely with minor use of hands  3. Transfers safely definite use of hands  2. Transfers with verbal cueing/supervision  1. Needs 1 person assist  0. Needs 2 person assist  Standing with eyes closed: Numbers; 0-4: 4  4. Stands safely for 10 seconds  3. Stands 10 seconds with supervision   2. Able to stand for 3 seconds  1. Unable to keep eyes closed for 3 seconds, but is safe  0. Needs assist to keep from falling Standing with feet together: Numbers; 0-4: 4 4. Stands for 1  minute safely 3. Stands for 1 minute with supervision 2. Unable to hold for 30 seconds  1. Needs help to attain position but can hold for 15 seconds  0. Needs help to attain position and unable to hold for 15 seconds Reaching forward with outstretched arm: Numbers; 0-4: 3  4. Reaches forward 10 inches  3. Reaches forward 5 inches  2. Reaches forward 2 inches  1. Reaches forward with supervision  0. Loses balance/requires assistace Retrieving object from the floor: Numbers; 0-4: 3 4. Able to pick up easily and safely 3. Able to pick up with supervision 2. Unable to pick up, but reaches within 1-2 inches independently 1. Unable to pick up and needs supervision 0. Unable/needs assistance to keep from falling  Turning to look behind: Numbers; 0-4: 2  4. Looks behind from both sides and weight shifts well  3. Looks behind one side only, other side less weight shift  2. Turns sideways only, maintains balance  1. Needs supervision when turning  0. Needs assistance  Turning 360 degrees: Numbers; 0-4: 2  4. Able to turn in </=4 seconds  3.  Able to turn on one side in </= 4 seconds   2. Able to turn slowly, but safely  1. Needs supervision or verbal cueing  0. Needs assistance Place alternate foot on stool: Numbers; 0-4: 4 4. Completes 8 steps in 20 seconds 3. Completes 8 steps in >20 seconds 2. 4 steps without assistance/supervision 1. Completes >2 steps with minimal assist 0. Unable, needs assist to keep from falling Standing with one foot in front: Numbers; 0-4: 1  4. Independent tandem for 30 seconds  3. Independent foot ahead for 30 seconds  2. Independent small step for 30 seconds  1. Needs help to step, but can hold for 15 seconds  0. Loses balance while standing/stepping Standing on one foot: Numbers; 0-4: 3 4. Holds >10 seconds 3. Holds 5-10 seconds 2. Holds >/=3 seconds  1. Holds <3 seconds 0. Unable   Total Score: 43/56     GAIT: Distance walked: within  clinic Assistive device utilized: rollator Level of assistance: Modified independence Comments: significant forward flexed posture, partial step through pattern with intermittent step to, reduced hip extension B, reduced knee ROM throughout swing/stance    [facility] Adult PT Treatment:                                                DATE: 05/12/22 Therapeutic Exercise: Nustep L6 x 5 min Black band rows Black band ext  [employer] press black single band  Leg press 30# 50# x 20 each bialteral  6 inch step ups x 10 each with bil Ue Heel raises and toe raises Cybex hip abduction 17.5 lbs 10 x 2 each  Spring board pull downs x 20  Upated HEP    [facility] Adult PT Treatment:                                                DATE: 05/11/22 Therapeutic Exercise: Nustep L 5 5 min Heel raises Alt hip flexion x 20 Hip abduction 10 x 2 Retro walking at counter 10 ft x 4 Green band row  x20 Shoulder ext Green x 20 Pilates Spring board pull downs x 10   Therapeutic Activity: Tandem stance 30 sec each AIREX marching without UE Airex narrow with head turns and trunk rotations    TODAY'S TREATMENT: [facility] Adult PT Treatment:                                                DATE: 04/29/22 Therapeutic Activity: FOTO + education MSK assessment + education 2MWT + education Functional mobility assessment + education Education re: AD use, pacing of activities and mechanics    [facility] Adult PT Treatment:                                                DATE: 04/23/22 Therapeutic Exercise: Nustep L6 UE/LE x 5 minutes  Therapeutic Activity: Side stepping in // bars Retro walking in // bars Slow marching for SLS without  UE BERG 43/56   [facility] Adult PT Treatment:                                                DATE: 03/31/21 Therapeutic Exercise: Nustep L6 5 minutes UE/LE  Supine Marching 10 x 2 with cues for lumbar stabilization. Hooklying gluteal squeeze 5 sec x 10 Mini Bridge 8 reps x 2 sets  Feet on exercise  ball: knee flexion and LTR - cues for ab brace and controlled movement.  Therapeutic Activity:  2 MWT : 295 Feet with SPC Gait with bilat trekking poles 100 feet as a trial      [facility] Adult PT Treatment:                                           DATE: 03/18/22 Therapeutic Exercise: Nustep L5 28mn LE/UE during subjective Swiss ball flexion, seated, x10, cues for breath control and appropriate ROM  Swiss ball iso press down 2x12 cues for breath control and posture HEP review/update  Therapeutic Activity: Fwd retro walking 4 laps // bars SBA, cues for posture, no breaks Sidestepping in // bars SBA 4 laps  Obstacle course figure 8, 6 cones w/ yellow/green/red cones (stop, slow, go), 3 laps w/ rest in between, CGA   PATIENT EDUCATION:  Education details: rationale for interventions Person educated: Patient Education method: Explanation, Demonstration, Tactile cues, Verbal cues Education comprehension: verbalized understanding, returned demonstration, verbal cues required, tactile cues required, and needs further education    HOME EXERCISE PROGRAM: Access Code: [email or web address] URL: [email or web address] Date: 03/18/2022 Prepared by: DEnis Slipper Exercises - Sit to Stand with Armchair  - 1 x daily - 7 x weekly - 2 sets - 10 reps - Standing March with Counter Support  - 1 x daily - 7 x weekly - 2 sets - 8 reps - Standing Hip Extension with Counter Support  - 1 x daily - 7 x weekly - 2 sets - 8 reps - Heel Raises with Counter Support  - 1 x daily - 7 x weekly - 2 sets - 10 reps 05/12/22 - Standing Row with Anchored Resistance  - 1 x daily - 7 x weekly - 2-3 sets - 10 reps - 5 hold - Shoulder extension with resistance - Neutral  - 1 x daily - 7 x weekly - 2-3 sets - 10 reps - 5 hold - Standing Anti-Rotation Press with Anchored Resistance  - 1 x daily - 7 x weekly - 1-2 sets - 10 reps - 3-5 hold   ASSESSMENT:   CLINICAL IMPRESSION: Pt reports min pain on arrival. Continued  with closed chain tolerance and he did well with one seated rest break. Updated HEP with standing core strength. He reported good tolerance to the session.        OBJECTIVE IMPAIRMENTS: Abnormal gait, decreased activity tolerance, decreased balance, decreased endurance, decreased mobility, difficulty walking, decreased ROM, decreased strength, hypomobility, improper body mechanics, postural dysfunction, and pain.    ACTIVITY LIMITATIONS: carrying, lifting, bending, sitting, standing, squatting, sleeping, stairs, transfers, bed mobility, bathing, toileting, dressing, and locomotion level   PARTICIPATION LIMITATIONS: meal prep, cleaning, laundry, shopping, community activity, and yard work   PERSONAL FACTORS: Time since onset of injury/illness/exacerbation and 3+ comorbidities: CAD,  PE, DVT, HTN  are also affecting patient's functional outcome.    REHAB POTENTIAL: Good   CLINICAL DECISION MAKING: Stable/uncomplicated   EVALUATION COMPLEXITY: Low     GOALS: Goals reviewed with patient? Yes   SHORT TERM GOALS: Target date: 02/25/2022   Pt will demonstrate appropriate understanding and performance of initially prescribed HEP in order to facilitate improved independence with management of symptoms.  Baseline: HEP provided on eval 03/04/22: Pt reports independence w/ HEP Goal status: MET   2. Pt will score greater than or equal to 42% on FOTO in order to demonstrate improved perception of function due to symptoms.            Baseline: 32% 03/04/22: 32% 03/31/22: 35% 04/29/22: 37%             Goal status: ONGOING   LONG TERM GOALS: Target date: 05/29/22 (updated 04/29/22)              Pt will score 51% on FOTO in order to demonstrate improved perception of functional status due to symptoms.  Baseline: 32% 03/31/22: 35% 04/29/22: 37%  Goal status: ONGOING     2.  Pt will demonstrate ability to ambulate at least 1069f with LRAD and no more than 2pt increase in baseline pain on NPS in order  to facilitate improved tolerance to community ambulation.  Baseline: pt unable to tolerate greater than household distances due to significant inc in pain 03/31/22: able to complete 2 minute walk test at 295 feet with increased pain from 5/10 to 7/10.  04/29/22: [id number]/ rollator no increase in pain 2MWT, significant fatigue; [id number] AD limited by fatigue    Goal status: PROGRESSING   3.  Pt will demonstrate hip flexion MMT of 5/5 in order to facilitate improved clearance with gait for reduced fall risk.  Baseline: 4/5 B w pain 03/31/21: see chart 04/29/22: see chart Goal status: MET   4. Pt will be able to perform TUG in less than or equal to 13 sec with LRAD in order to indicate reduced risk of falling (cutoff score for fall risk 13.5 sec in community dwelling older adults per [employer] al, 2000)            Baseline: 22sec with rollator  02/16/22: 12sec with rollator             Goal status: MET   5. Pt will score >47/56 on BERG to indicate reduced fall risk and increased safety w/ mobility.   04/23/22: 43/56  Goal status: NEW (04/29/22)   PLAN (updated 04/29/22):   PT FREQUENCY: 2x/week   PT DURATION: 4 weeks   PLANNED INTERVENTIONS: Therapeutic exercises, Therapeutic activity, Neuromuscular re-education, Balance training, Gait training, Patient/Family education, Self Care, Stair training, DME instructions, Aquatic Therapy, Cryotherapy, Moist heat, Taping, Manual therapy, and Re-evaluation.   PLAN FOR NEXT SESSION:   Review/update HEP (hip mobility). Gait/functional mobility. Consider DGI for higher level balance   JesHessie DienerTA 05/12/22 10:10 AM Phone: [phone]: [phone]

## 2022-05-15 ENCOUNTER — Other Ambulatory Visit (HOSPITAL_COMMUNITY): Payer: Self-pay

## 2022-05-18 NOTE — Therapy (Incomplete)
OUTPATIENT PHYSICAL THERAPY PROGRESS NOTE    [name] MRN: [id number] DOB:[birth year], 57 y.o., male Today's Date: 05/18/2022      PCP: Holley Bouche, MD   REFERRING PROVIDER: Judith Part, MD  END OF SESSION:     Past Medical History:  Diagnosis Date   Acute pulmonary embolus ([place])    bilateral submassive PE in setting of missing several doses of Xarelto for his DVT   Arthritis    DVT (deep venous thrombosis) ([place]) 11/28/2013   RT LEG   High cholesterol    Hypertension    Lumbar spine scoliosis    Marijuana use    Sleep apnea    Past Surgical History:  Procedure Laterality Date   ABDOMINAL EXPOSURE N/A 01/14/2022   Procedure: ABDOMINAL EXPOSURE;  Surgeon: Marty Heck, MD;  Location: [place];  Service: Vascular;  Laterality: N/A;   ANTERIOR LUMBAR FUSION Left 01/14/2022   Procedure: Lumbar three-four Lumbar four-five Oblique Lumbar Interbody Fusion;  Surgeon: Judith Part, MD;  Location: [place];  Service: Neurosurgery;  Laterality: Left;   HERNIA REPAIR     [id number] and [id number]; umbilical hernia repair   KNEE SURGERY Bilateral    Left knee 1993, right knee 2004   TONSILLECTOMY     TOTAL KNEE ARTHROPLASTY Left 02/10/2017   Procedure: LEFT TOTAL KNEE ARTHROPLASTY;  Surgeon: Leandrew Koyanagi, MD;  Location: [place];  Service: Orthopedics;  Laterality: Left;   TOTAL KNEE ARTHROPLASTY Right 07/28/2017   Procedure: RIGHT TOTAL KNEE ARTHROPLASTY;  Surgeon: Leandrew Koyanagi, MD;  Location: [place];  Service: Orthopedics;  Laterality: Right;   TRANSFORAMINAL LUMBAR INTERBODY FUSION W/ MIS 1 LEVEL N/A 01/14/2022   Procedure: OPEN TRANSFORAMINAL LUMBAR INTERBODY FUSION  LUMBAR FIVE-SACRAL ONE ,LAMINECTOMIES AND POSTERIOR LATERAL INSTRUMENTATION FUSION  LUMBAR THREE TO FIVE SACRAL ONE;  Surgeon: Judith Part, MD;  Location: [place];  Service: Neurosurgery;  Laterality: N/A;   Patient Active Problem List   Diagnosis Date Noted   Leg swelling  02/23/2022   Scoliosis 01/14/2022   Snoring 09/22/2021   Insomnia due to mental condition 09/22/2021   At risk for mood disturbance 09/01/2021   Sleep disturbance 07/28/2021   Chronic bilateral low back pain without sciatica 07/25/2019   Neurogenic claudication 05/19/2019   Hyperlipidemia LDL goal <70 07/20/2018   History of pulmonary embolus (PE) 07/20/2017   Mild CAD 07/20/2017   S/p total knee replacement, bilateral 02/10/2017   Chronic anticoagulation 01/26/2017   Pulmonary embolism ([place]) 06/03/2016   Encounter for chronic pain management 04/06/2014   Tobacco abuse 12/05/2013   DVT (deep venous thrombosis) ([place]) 11/28/2013   HTN (hypertension) 05/04/2013   Lumbar back pain 05/04/2013   BMI 33.0-33.9,adult 05/04/2013   Hx of substance abuse ([place]) 05/04/2013    REFERRING DIAG:  M54.50 (ICD-10-CM) - Lumbar back pain  THERAPY DIAG:  No diagnosis found.  Rationale for Evaluation and Treatment Rehabilitation  PERTINENT HISTORY:  CAD, PE, DVT, HTN S/p lumbar interbody fusion 01/14/22  PRECAUTIONS: Back, abdominal and posterior incisions Per pt on 03/18/22, spine precautions lifted by surgeon at most recent visit     SUBJECTIVE:  SUBJECTIVE STATEMENT:  I felt good when I left last time. I like those pulling exercises. I need more of those.     PAIN:  Are you having pain: 5/10 Location: low back How would you describe your pain? Stiff, achey Best in past week: 4/10 with movement, no resting pain at best Worst in past week: 8/10 Aggravating factors: walking/standing household distances, prolonged sitting Easing factors: rest breaks, ice    OBJECTIVE: (objective measures completed at initial evaluation unless otherwise dated)    DIAGNOSTIC FINDINGS:  Post op interbody fusion lumbar    PATIENT SURVEYS:  FOTO 32% FOTO 03/04/22 32% FOTO 03/31/22 35% 04/29/22: 37%    COGNITION: Overall cognitive status: Within functional limits for tasks assessed                          SENSATION: Light touch intact B LEs     POSTURE: increased kyphosis and forward head posture, UT elevation   PALPATION/OBSERVATION: Deferred as pt wears back brace to eval - states he saw MD this morning and no concerns; abdominal incision healed and posterior incision with mild drainage but WNL per pt report   LUMBAR ROM:    AROM 03/18/22  Flexion  75% distal to knee, mild pain  Extension  25% p!  Right lateral flexion    Left lateral flexion    Right rotation 50% stiffness  Left rotation  50% stiffness   (Blank rows = not tested) Comments: Deferred on eval given spine precautions 04/29/22: Deferred given time constraints and pt arriving with back brace donned     LOWER EXTREMITY MMT:     MMT Right eval Left eval Right 03/31/22 Left 03/31/22 R/L 04/29/22  Hip flexion 4p! 4p! 4+ P 4+ 5/5 (stiffness R side)  Hip abduction (modified sitting) 4+ 4+ 4+ 4+ 4+/4+  Hip internal rotation         Hip external rotation     4- 4 4+/4+  Knee flexion 5 4+ 5 5   Knee extension 4+ 4+ 5 5    (Blank rows = not tested)   Comments: mild pulling in hamstring with knee ext on R, no pain with resistance; mild pain with hip flex B that resolves with cessation of movement       FUNCTIONAL TESTS:  Sit to stand transfer, standard chair: heavy use of UE on rollator and chair, reduced fwd weight shift, wide BOS, significantly inc time and effort   TUG: 22 seconds from standard chair w/ rollator, gait mechanics as below, heavy UE use  02/16/22: TUG 12sec standard chair with rollator, continues with impaired gait mechanics and heavy UE use, fwd flexed posture  2 minute walk test: 03/31/22: 295 Feet  2MWT 04/29/22: 438f w/ rollator  [id number] no AD, RPE 5/10, limited by fatigue, SBA 05/11/22: Tandem stance 30 sec       BERG BALANCE: 43/56 (04/23/22)  Sitting to Standing: Numbers; 0-4: 3  4. Stands without using hands and stabilize independently  3. Stands independently using hands  2. Stands using hands after multiple trials  1. Min A to stand  0. Mod-Max A to stand Standing unsupported: Numbers; 0-4: 4  4. Stands safely for 2 minutes  3. Stands 2 minutes with supervision  2. Stands 30 seconds unsupported  1. Needs several tries to stand unsupported for 30 seconds  0. Unable to stand unsupported for 30 seconds Sitting unsupported: Numbers; 0-4: 4  4. Sits for  2 minutes independently  3. Sits for 2 minutes with supervision  2. Able to sit 30 seconds  1. Able to sit 10 seconds  0. Unable to sit for 10 seconds Standing to Sitting: Numbers; 0-4: 3 4. Sits safely with minimal use of hands 3. Controls descent with hands 2. Uses back of legs against chair to control descent 1. Sits independently, but uncontrolled descent 0. Needs assistance Transfers: Numbers; 0-4: 3  4. Transfers safely with minor use of hands  3. Transfers safely definite use of hands  2. Transfers with verbal cueing/supervision  1. Needs 1 person assist  0. Needs 2 person assist  Standing with eyes closed: Numbers; 0-4: 4  4. Stands safely for 10 seconds  3. Stands 10 seconds with supervision   2. Able to stand for 3 seconds  1. Unable to keep eyes closed for 3 seconds, but is safe  0. Needs assist to keep from falling Standing with feet together: Numbers; 0-4: 4 4. Stands for 1 minute safely 3. Stands for 1 minute with supervision 2. Unable to hold for 30 seconds  1. Needs help to attain position but can hold for 15 seconds  0. Needs help to attain position and unable to hold for 15 seconds Reaching forward with outstretched arm: Numbers; 0-4: 3  4. Reaches forward 10 inches  3. Reaches forward 5 inches  2. Reaches forward 2 inches  1. Reaches forward with supervision  0. Loses balance/requires  assistace Retrieving object from the floor: Numbers; 0-4: 3 4. Able to pick up easily and safely 3. Able to pick up with supervision 2. Unable to pick up, but reaches within 1-2 inches independently 1. Unable to pick up and needs supervision 0. Unable/needs assistance to keep from falling  Turning to look behind: Numbers; 0-4: 2  4. Looks behind from both sides and weight shifts well  3. Looks behind one side only, other side less weight shift  2. Turns sideways only, maintains balance  1. Needs supervision when turning  0. Needs assistance  Turning 360 degrees: Numbers; 0-4: 2  4. Able to turn in </=4 seconds  3. Able to turn on one side in </= 4 seconds   2. Able to turn slowly, but safely  1. Needs supervision or verbal cueing  0. Needs assistance Place alternate foot on stool: Numbers; 0-4: 4 4. Completes 8 steps in 20 seconds 3. Completes 8 steps in >20 seconds 2. 4 steps without assistance/supervision 1. Completes >2 steps with minimal assist 0. Unable, needs assist to keep from falling Standing with one foot in front: Numbers; 0-4: 1  4. Independent tandem for 30 seconds  3. Independent foot ahead for 30 seconds  2. Independent small step for 30 seconds  1. Needs help to step, but can hold for 15 seconds  0. Loses balance while standing/stepping Standing on one foot: Numbers; 0-4: 3 4. Holds >10 seconds 3. Holds 5-10 seconds 2. Holds >/=3 seconds  1. Holds <3 seconds 0. Unable   Total Score: 43/56     GAIT: Distance walked: within clinic Assistive device utilized: rollator Level of assistance: Modified independence Comments: significant forward flexed posture, partial step through pattern with intermittent step to, reduced hip extension B, reduced knee ROM throughout swing/stance    TODAY'S TREATMENT:  [facility] Adult PT Treatment:  DATE: 05/19/22 Therapeutic Exercise: *** Manual Therapy: *** Neuromuscular  re-ed: *** Therapeutic Activity: *** Modalities: *** Self Care: ***   [facility] Adult PT Treatment:                                                DATE: 05/12/22 Therapeutic Exercise: Nustep L6 x 5 min Black band rows Black band ext  [employer] press black single band  Leg press 30# 50# x 20 each bialteral  6 inch step ups x 10 each with bil Ue Heel raises and toe raises Cybex hip abduction 17.5 lbs 10 x 2 each  Spring board pull downs x 20  Upated HEP    [facility] Adult PT Treatment:                                                DATE: 05/11/22 Therapeutic Exercise: Nustep L 5 5 min Heel raises Alt hip flexion x 20 Hip abduction 10 x 2 Retro walking at counter 10 ft x 4 Green band row  x20 Shoulder ext Green x 20 Pilates Spring board pull downs x 10   Therapeutic Activity: Tandem stance 30 sec each AIREX marching without UE Airex narrow with head turns and trunk rotations     PATIENT EDUCATION:  Education details: rationale for interventions Person educated: Patient Education method: Explanation, Demonstration, Tactile cues, Verbal cues Education comprehension: verbalized understanding, returned demonstration, verbal cues required, tactile cues required, and needs further education    HOME EXERCISE PROGRAM: Access Code: [email or web address] URL: [email or web address] Date: 03/18/2022 Prepared by: Enis Slipper  Exercises - Sit to Stand with Armchair  - 1 x daily - 7 x weekly - 2 sets - 10 reps - Standing March with Counter Support  - 1 x daily - 7 x weekly - 2 sets - 8 reps - Standing Hip Extension with Counter Support  - 1 x daily - 7 x weekly - 2 sets - 8 reps - Heel Raises with Counter Support  - 1 x daily - 7 x weekly - 2 sets - 10 reps 05/12/22 - Standing Row with Anchored Resistance  - 1 x daily - 7 x weekly - 2-3 sets - 10 reps - 5 hold - Shoulder extension with resistance - Neutral  - 1 x daily - 7 x weekly - 2-3 sets - 10 reps - 5 hold - Standing  Anti-Rotation Press with Anchored Resistance  - 1 x daily - 7 x weekly - 1-2 sets - 10 reps - 3-5 hold   ASSESSMENT:   CLINICAL IMPRESSION: ***  *** Pt reports min pain on arrival. Continued with closed chain tolerance and he did well with one seated rest break. Updated HEP with standing core strength. He reported good tolerance to the session.     OBJECTIVE IMPAIRMENTS: Abnormal gait, decreased activity tolerance, decreased balance, decreased endurance, decreased mobility, difficulty walking, decreased ROM, decreased strength, hypomobility, improper body mechanics, postural dysfunction, and pain.    ACTIVITY LIMITATIONS: carrying, lifting, bending, sitting, standing, squatting, sleeping, stairs, transfers, bed mobility, bathing, toileting, dressing, and locomotion level   PARTICIPATION LIMITATIONS: meal prep, cleaning, laundry, shopping, community activity, and yard work   PERSONAL FACTORS: Time since onset of injury/illness/exacerbation and 3+ comorbidities:  CAD, PE, DVT, HTN  are also affecting patient's functional outcome.    REHAB POTENTIAL: Good   CLINICAL DECISION MAKING: Stable/uncomplicated   EVALUATION COMPLEXITY: Low     GOALS: Goals reviewed with patient? Yes   SHORT TERM GOALS: Target date: 02/25/2022   Pt will demonstrate appropriate understanding and performance of initially prescribed HEP in order to facilitate improved independence with management of symptoms.  Baseline: HEP provided on eval 03/04/22: Pt reports independence w/ HEP Goal status: MET   2. Pt will score greater than or equal to 42% on FOTO in order to demonstrate improved perception of function due to symptoms.            Baseline: 32% 03/04/22: 32% 03/31/22: 35% 04/29/22: 37%             Goal status: ONGOING   LONG TERM GOALS: Target date: 05/29/22 (updated 04/29/22)              Pt will score 51% on FOTO in order to demonstrate improved perception of functional status due to symptoms.  Baseline:  32% 03/31/22: 35% 04/29/22: 37%  Goal status: ONGOING     2.  Pt will demonstrate ability to ambulate at least 1075f with LRAD and no more than 2pt increase in baseline pain on NPS in order to facilitate improved tolerance to community ambulation.  Baseline: pt unable to tolerate greater than household distances due to significant inc in pain 03/31/22: able to complete 2 minute walk test at 295 feet with increased pain from 5/10 to 7/10.  04/29/22: [id number]/ rollator no increase in pain 2MWT, significant fatigue; [id number] AD limited by fatigue    Goal status: PROGRESSING   3.  Pt will demonstrate hip flexion MMT of 5/5 in order to facilitate improved clearance with gait for reduced fall risk.  Baseline: 4/5 B w pain 03/31/21: see chart 04/29/22: see chart Goal status: MET   4. Pt will be able to perform TUG in less than or equal to 13 sec with LRAD in order to indicate reduced risk of falling (cutoff score for fall risk 13.5 sec in community dwelling older adults per [facility], 2000)            Baseline: 22sec with rollator  02/16/22: 12sec with rollator             Goal status: MET   5. Pt will score >47/56 on BERG to indicate reduced fall risk and increased safety w/ mobility.   04/23/22: 43/56  Goal status: NEW (04/29/22)   PLAN (updated 04/29/22):   PT FREQUENCY: 2x/week   PT DURATION: 4 weeks   PLANNED INTERVENTIONS: Therapeutic exercises, Therapeutic activity, Neuromuscular re-education, Balance training, Gait training, Patient/Family education, Self Care, Stair training, DME instructions, Aquatic Therapy, Cryotherapy, Moist heat, Taping, Manual therapy, and Re-evaluation.   PLAN FOR NEXT SESSION:   Review/update HEP (hip mobility). Gait/functional mobility. Consider DGI for higher level balance ***   DavLeeroy Cha, DPT 05/18/2022 1:08 PM

## 2022-05-19 ENCOUNTER — Ambulatory Visit: Payer: BLUE CROSS/BLUE SHIELD | Admitting: Physical Therapy

## 2022-05-19 ENCOUNTER — Other Ambulatory Visit (HOSPITAL_COMMUNITY): Payer: Self-pay

## 2022-05-20 NOTE — Therapy (Incomplete)
OUTPATIENT PHYSICAL THERAPY PROGRESS NOTE    [name] MRN: [id number] DOB:November 30, 1965, 57 y.o., male Today's Date: 05/20/2022      PCP: Holley Bouche, MD   REFERRING PROVIDER: Judith Part, MD  END OF SESSION:     Past Medical History:  Diagnosis Date   Acute pulmonary embolus ([place])    bilateral submassive PE in setting of missing several doses of Xarelto for his DVT   Arthritis    DVT (deep venous thrombosis) ([place]) 11/28/2013   RT LEG   High cholesterol    Hypertension    Lumbar spine scoliosis    Marijuana use    Sleep apnea    Past Surgical History:  Procedure Laterality Date   ABDOMINAL EXPOSURE N/A 01/14/2022   Procedure: ABDOMINAL EXPOSURE;  Surgeon: Marty Heck, MD;  Location: [facility];  Service: Vascular;  Laterality: N/A;   ANTERIOR LUMBAR FUSION Left 01/14/2022   Procedure: Lumbar three-four Lumbar four-five Oblique Lumbar Interbody Fusion;  Surgeon: Judith Part, MD;  Location: [facility];  Service: Neurosurgery;  Laterality: Left;   HERNIA REPAIR     [id number] and [id number]; umbilical hernia repair   KNEE SURGERY Bilateral    Left knee 1993, right knee 2004   TONSILLECTOMY     TOTAL KNEE ARTHROPLASTY Left 02/10/2017   Procedure: LEFT TOTAL KNEE ARTHROPLASTY;  Surgeon: Leandrew Koyanagi, MD;  Location: [facility];  Service: Orthopedics;  Laterality: Left;   TOTAL KNEE ARTHROPLASTY Right 07/28/2017   Procedure: RIGHT TOTAL KNEE ARTHROPLASTY;  Surgeon: Leandrew Koyanagi, MD;  Location: [facility];  Service: Orthopedics;  Laterality: Right;   TRANSFORAMINAL LUMBAR INTERBODY FUSION W/ MIS 1 LEVEL N/A 01/14/2022   Procedure: OPEN TRANSFORAMINAL LUMBAR INTERBODY FUSION  LUMBAR FIVE-SACRAL ONE ,LAMINECTOMIES AND POSTERIOR LATERAL INSTRUMENTATION FUSION  LUMBAR THREE TO FIVE SACRAL ONE;  Surgeon: Judith Part, MD;  Location: [facility];  Service: Neurosurgery;  Laterality: N/A;   Patient Active Problem List   Diagnosis Date Noted   Leg swelling  02/23/2022   Scoliosis 01/14/2022   Snoring 09/22/2021   Insomnia due to mental condition 09/22/2021   At risk for mood disturbance 09/01/2021   Sleep disturbance 07/28/2021   Chronic bilateral low back pain without sciatica 07/25/2019   Neurogenic claudication 05/19/2019   Hyperlipidemia LDL goal <70 07/20/2018   History of pulmonary embolus (PE) 07/20/2017   Mild CAD 07/20/2017   S/p total knee replacement, bilateral 02/10/2017   Chronic anticoagulation 01/26/2017   Pulmonary embolism ([name]) 06/03/2016   Encounter for chronic pain management 04/06/2014   Tobacco abuse 12/05/2013   DVT (deep venous thrombosis) ([place]) 11/28/2013   HTN (hypertension) 05/04/2013   Lumbar back pain 05/04/2013   BMI 33.0-33.9,adult 05/04/2013   Hx of substance abuse ([place]) 05/04/2013    REFERRING DIAG:  M54.50 (ICD-10-CM) - Lumbar back pain  THERAPY DIAG:  No diagnosis found.  Rationale for Evaluation and Treatment Rehabilitation  PERTINENT HISTORY:  CAD, PE, DVT, HTN S/p lumbar interbody fusion 01/14/22  PRECAUTIONS: Back, abdominal and posterior incisions Per pt on 03/18/22, spine precautions lifted by surgeon at most recent visit     SUBJECTIVE:  SUBJECTIVE STATEMENT:  I felt good when I left last time. I like those pulling exercises. I need more of those.     PAIN:  Are you having pain: 5/10 Location: low back How would you describe your pain? Stiff, achey Best in past week: 4/10 with movement, no resting pain at best Worst in past week: 8/10 Aggravating factors: walking/standing household distances, prolonged sitting Easing factors: rest breaks, ice    OBJECTIVE: (objective measures completed at initial evaluation unless otherwise dated)    DIAGNOSTIC FINDINGS:  Post op interbody fusion lumbar    PATIENT SURVEYS:  FOTO 32% FOTO 03/04/22 32% FOTO 03/31/22 35% 04/29/22: 37%    COGNITION: Overall cognitive status: Within functional limits for tasks assessed                          SENSATION: Light touch intact B LEs     POSTURE: increased kyphosis and forward head posture, UT elevation   PALPATION/OBSERVATION: Deferred as pt wears back brace to eval - states he saw MD this morning and no concerns; abdominal incision healed and posterior incision with mild drainage but WNL per pt report   LUMBAR ROM:    AROM 03/18/22  Flexion  75% distal to knee, mild pain  Extension  25% p!  Right lateral flexion    Left lateral flexion    Right rotation 50% stiffness  Left rotation  50% stiffness   (Blank rows = not tested) Comments: Deferred on eval given spine precautions 04/29/22: Deferred given time constraints and pt arriving with back brace donned     LOWER EXTREMITY MMT:     MMT Right eval Left eval Right 03/31/22 Left 03/31/22 R/L 04/29/22  Hip flexion 4p! 4p! 4+ P 4+ 5/5 (stiffness R side)  Hip abduction (modified sitting) 4+ 4+ 4+ 4+ 4+/4+  Hip internal rotation         Hip external rotation     4- 4 4+/4+  Knee flexion 5 4+ 5 5   Knee extension 4+ 4+ 5 5    (Blank rows = not tested)   Comments: mild pulling in hamstring with knee ext on R, no pain with resistance; mild pain with hip flex B that resolves with cessation of movement       FUNCTIONAL TESTS:  Sit to stand transfer, standard chair: heavy use of UE on rollator and chair, reduced fwd weight shift, wide BOS, significantly inc time and effort   TUG: 22 seconds from standard chair w/ rollator, gait mechanics as below, heavy UE use  02/16/22: TUG 12sec standard chair with rollator, continues with impaired gait mechanics and heavy UE use, fwd flexed posture  2 minute walk test: 03/31/22: 295 Feet  2MWT 04/29/22: 458f w/ rollator  3840fgait no AD, RPE 5/10, limited by fatigue, SBA 05/11/22: Tandem stance 30 sec       BERG BALANCE: 43/56 (04/23/22)  Sitting to Standing: Numbers; 0-4: 3  4. Stands without using hands and stabilize independently  3. Stands independently using hands  2. Stands using hands after multiple trials  1. Min A to stand  0. Mod-Max A to stand Standing unsupported: Numbers; 0-4: 4  4. Stands safely for 2 minutes  3. Stands 2 minutes with supervision  2. Stands 30 seconds unsupported  1. Needs several tries to stand unsupported for 30 seconds  0. Unable to stand unsupported for 30 seconds Sitting unsupported: Numbers; 0-4: 4  4. Sits for  2 minutes independently  3. Sits for 2 minutes with supervision  2. Able to sit 30 seconds  1. Able to sit 10 seconds  0. Unable to sit for 10 seconds Standing to Sitting: Numbers; 0-4: 3 4. Sits safely with minimal use of hands 3. Controls descent with hands 2. Uses back of legs against chair to control descent 1. Sits independently, but uncontrolled descent 0. Needs assistance Transfers: Numbers; 0-4: 3  4. Transfers safely with minor use of hands  3. Transfers safely definite use of hands  2. Transfers with verbal cueing/supervision  1. Needs 1 person assist  0. Needs 2 person assist  Standing with eyes closed: Numbers; 0-4: 4  4. Stands safely for 10 seconds  3. Stands 10 seconds with supervision   2. Able to stand for 3 seconds  1. Unable to keep eyes closed for 3 seconds, but is safe  0. Needs assist to keep from falling Standing with feet together: Numbers; 0-4: 4 4. Stands for 1 minute safely 3. Stands for 1 minute with supervision 2. Unable to hold for 30 seconds  1. Needs help to attain position but can hold for 15 seconds  0. Needs help to attain position and unable to hold for 15 seconds Reaching forward with outstretched arm: Numbers; 0-4: 3  4. Reaches forward 10 inches  3. Reaches forward 5 inches  2. Reaches forward 2 inches  1. Reaches forward with supervision  0. Loses balance/requires  assistace Retrieving object from the floor: Numbers; 0-4: 3 4. Able to pick up easily and safely 3. Able to pick up with supervision 2. Unable to pick up, but reaches within 1-2 inches independently 1. Unable to pick up and needs supervision 0. Unable/needs assistance to keep from falling  Turning to look behind: Numbers; 0-4: 2  4. Looks behind from both sides and weight shifts well  3. Looks behind one side only, other side less weight shift  2. Turns sideways only, maintains balance  1. Needs supervision when turning  0. Needs assistance  Turning 360 degrees: Numbers; 0-4: 2  4. Able to turn in </=4 seconds  3. Able to turn on one side in </= 4 seconds   2. Able to turn slowly, but safely  1. Needs supervision or verbal cueing  0. Needs assistance Place alternate foot on stool: Numbers; 0-4: 4 4. Completes 8 steps in 20 seconds 3. Completes 8 steps in >20 seconds 2. 4 steps without assistance/supervision 1. Completes >2 steps with minimal assist 0. Unable, needs assist to keep from falling Standing with one foot in front: Numbers; 0-4: 1  4. Independent tandem for 30 seconds  3. Independent foot ahead for 30 seconds  2. Independent small step for 30 seconds  1. Needs help to step, but can hold for 15 seconds  0. Loses balance while standing/stepping Standing on one foot: Numbers; 0-4: 3 4. Holds >10 seconds 3. Holds 5-10 seconds 2. Holds >/=3 seconds  1. Holds <3 seconds 0. Unable   Total Score: 43/56     GAIT: Distance walked: within clinic Assistive device utilized: rollator Level of assistance: Modified independence Comments: significant forward flexed posture, partial step through pattern with intermittent step to, reduced hip extension B, reduced knee ROM throughout swing/stance    TODAY'S TREATMENT:  [facility] Adult PT Treatment:  DATE: 05/21/22 Therapeutic Exercise: *** Manual Therapy: *** Neuromuscular  re-ed: *** Therapeutic Activity: *** Modalities: *** Self Care: ***   [facility] Adult PT Treatment:                                                DATE: 05/12/22 Therapeutic Exercise: Nustep L6 x 5 min Black band rows Black band ext  [employer] press black single band  Leg press 30# 50# x 20 each bialteral  6 inch step ups x 10 each with bil Ue Heel raises and toe raises Cybex hip abduction 17.5 lbs 10 x 2 each  Spring board pull downs x 20  Upated HEP    [facility] Adult PT Treatment:                                                DATE: 05/11/22 Therapeutic Exercise: Nustep L 5 5 min Heel raises Alt hip flexion x 20 Hip abduction 10 x 2 Retro walking at counter 10 ft x 4 Green band row  x20 Shoulder ext Green x 20 Pilates Spring board pull downs x 10   Therapeutic Activity: Tandem stance 30 sec each AIREX marching without UE Airex narrow with head turns and trunk rotations     PATIENT EDUCATION:  Education details: rationale for interventions Person educated: Patient Education method: Explanation, Demonstration, Tactile cues, Verbal cues Education comprehension: verbalized understanding, returned demonstration, verbal cues required, tactile cues required, and needs further education    HOME EXERCISE PROGRAM: Access Code: [email or web address] URL: [email or web address] Date: 03/18/2022 Prepared by: Enis Slipper  Exercises - Sit to Stand with Armchair  - 1 x daily - 7 x weekly - 2 sets - 10 reps - Standing March with Counter Support  - 1 x daily - 7 x weekly - 2 sets - 8 reps - Standing Hip Extension with Counter Support  - 1 x daily - 7 x weekly - 2 sets - 8 reps - Heel Raises with Counter Support  - 1 x daily - 7 x weekly - 2 sets - 10 reps 05/12/22 - Standing Row with Anchored Resistance  - 1 x daily - 7 x weekly - 2-3 sets - 10 reps - 5 hold - Shoulder extension with resistance - Neutral  - 1 x daily - 7 x weekly - 2-3 sets - 10 reps - 5 hold - Standing  Anti-Rotation Press with Anchored Resistance  - 1 x daily - 7 x weekly - 1-2 sets - 10 reps - 3-5 hold   ASSESSMENT:   CLINICAL IMPRESSION: ***  *** Pt reports min pain on arrival. Continued with closed chain tolerance and he did well with one seated rest break. Updated HEP with standing core strength. He reported good tolerance to the session.     OBJECTIVE IMPAIRMENTS: Abnormal gait, decreased activity tolerance, decreased balance, decreased endurance, decreased mobility, difficulty walking, decreased ROM, decreased strength, hypomobility, improper body mechanics, postural dysfunction, and pain.    ACTIVITY LIMITATIONS: carrying, lifting, bending, sitting, standing, squatting, sleeping, stairs, transfers, bed mobility, bathing, toileting, dressing, and locomotion level   PARTICIPATION LIMITATIONS: meal prep, cleaning, laundry, shopping, community activity, and yard work   PERSONAL FACTORS: Time since onset of injury/illness/exacerbation and 3+ comorbidities:  CAD, PE, DVT, HTN  are also affecting patient's functional outcome.    REHAB POTENTIAL: Good   CLINICAL DECISION MAKING: Stable/uncomplicated   EVALUATION COMPLEXITY: Low     GOALS: Goals reviewed with patient? Yes   SHORT TERM GOALS: Target date: 02/25/2022   Pt will demonstrate appropriate understanding and performance of initially prescribed HEP in order to facilitate improved independence with management of symptoms.  Baseline: HEP provided on eval 03/04/22: Pt reports independence w/ HEP Goal status: MET   2. Pt will score greater than or equal to 42% on FOTO in order to demonstrate improved perception of function due to symptoms.            Baseline: 32% 03/04/22: 32% 03/31/22: 35% 04/29/22: 37%             Goal status: ONGOING   LONG TERM GOALS: Target date: 05/29/22 (updated 04/29/22)              Pt will score 51% on FOTO in order to demonstrate improved perception of functional status due to symptoms.  Baseline:  32% 03/31/22: 35% 04/29/22: 37%  Goal status: ONGOING     2.  Pt will demonstrate ability to ambulate at least 1058f with LRAD and no more than 2pt increase in baseline pain on NPS in order to facilitate improved tolerance to community ambulation.  Baseline: pt unable to tolerate greater than household distances due to significant inc in pain 03/31/22: able to complete 2 minute walk test at 295 feet with increased pain from 5/10 to 7/10.  04/29/22: 437fw/ rollator no increase in pain 2MWT, significant fatigue; [id number] AD limited by fatigue    Goal status: PROGRESSING   3.  Pt will demonstrate hip flexion MMT of 5/5 in order to facilitate improved clearance with gait for reduced fall risk.  Baseline: 4/5 B w pain 03/31/21: see chart 04/29/22: see chart Goal status: MET   4. Pt will be able to perform TUG in less than or equal to 13 sec with LRAD in order to indicate reduced risk of falling (cutoff score for fall risk 13.5 sec in community dwelling older adults per [employer] al, 2000)            Baseline: 22sec with rollator  02/16/22: 12sec with rollator             Goal status: MET   5. Pt will score >47/56 on BERG to indicate reduced fall risk and increased safety w/ mobility.   04/23/22: 43/56  Goal status: NEW (04/29/22)   PLAN (updated 04/29/22):   PT FREQUENCY: 2x/week   PT DURATION: 4 weeks   PLANNED INTERVENTIONS: Therapeutic exercises, Therapeutic activity, Neuromuscular re-education, Balance training, Gait training, Patient/Family education, Self Care, Stair training, DME instructions, Aquatic Therapy, Cryotherapy, Moist heat, Taping, Manual therapy, and Re-evaluation.   PLAN FOR NEXT SESSION:   Review/update HEP (hip mobility). Gait/functional mobility. Consider DGI for higher level balance ***   DavLeeroy Cha, DPT 05/20/2022 12:43 PM

## 2022-05-21 ENCOUNTER — Telehealth: Payer: Self-pay | Admitting: Physical Therapy

## 2022-05-21 ENCOUNTER — Ambulatory Visit: Payer: BLUE CROSS/BLUE SHIELD | Admitting: Physical Therapy

## 2022-05-21 NOTE — Therapy (Incomplete)
OUTPATIENT PHYSICAL THERAPY TREATMENT NOTE    [name] MRN: [id number] DOB:[birth year], 57 y.o., male Today's Date: 05/21/2022      PCP: Holley Bouche, MD   REFERRING PROVIDER: Judith Part, MD  END OF SESSION:     Past Medical History:  Diagnosis Date   Acute pulmonary embolus ([place])    bilateral submassive PE in setting of missing several doses of Xarelto for his DVT   Arthritis    DVT (deep venous thrombosis) ([place]) 11/28/2013   RT LEG   High cholesterol    Hypertension    Lumbar spine scoliosis    Marijuana use    Sleep apnea    Past Surgical History:  Procedure Laterality Date   ABDOMINAL EXPOSURE N/A 01/14/2022   Procedure: ABDOMINAL EXPOSURE;  Surgeon: Marty Heck, MD;  Location: [place];  Service: Vascular;  Laterality: N/A;   ANTERIOR LUMBAR FUSION Left 01/14/2022   Procedure: Lumbar three-four Lumbar four-five Oblique Lumbar Interbody Fusion;  Surgeon: Judith Part, MD;  Location: [facility];  Service: Neurosurgery;  Laterality: Left;   HERNIA REPAIR     [id number] and [id number]; umbilical hernia repair   KNEE SURGERY Bilateral    Left knee 1993, right knee 2004   TONSILLECTOMY     TOTAL KNEE ARTHROPLASTY Left 02/10/2017   Procedure: LEFT TOTAL KNEE ARTHROPLASTY;  Surgeon: Leandrew Koyanagi, MD;  Location: [facility];  Service: Orthopedics;  Laterality: Left;   TOTAL KNEE ARTHROPLASTY Right 07/28/2017   Procedure: RIGHT TOTAL KNEE ARTHROPLASTY;  Surgeon: Leandrew Koyanagi, MD;  Location: [place] [facility];  Service: Orthopedics;  Laterality: Right;   TRANSFORAMINAL LUMBAR INTERBODY FUSION W/ MIS 1 LEVEL N/A 01/14/2022   Procedure: OPEN TRANSFORAMINAL LUMBAR INTERBODY FUSION  LUMBAR FIVE-SACRAL ONE ,LAMINECTOMIES AND POSTERIOR LATERAL INSTRUMENTATION FUSION  LUMBAR THREE TO FIVE SACRAL ONE;  Surgeon: Judith Part, MD;  Location: [facility];  Service: Neurosurgery;  Laterality: N/A;   Patient Active Problem List   Diagnosis Date Noted   Leg swelling  02/23/2022   Scoliosis 01/14/2022   Snoring 09/22/2021   Insomnia due to mental condition 09/22/2021   At risk for mood disturbance 09/01/2021   Sleep disturbance 07/28/2021   Chronic bilateral low back pain without sciatica 07/25/2019   Neurogenic claudication 05/19/2019   Hyperlipidemia LDL goal <70 07/20/2018   History of pulmonary embolus (PE) 07/20/2017   Mild CAD 07/20/2017   S/p total knee replacement, bilateral 02/10/2017   Chronic anticoagulation 01/26/2017   Pulmonary embolism ([place]) 06/03/2016   Encounter for chronic pain management 04/06/2014   Tobacco abuse 12/05/2013   DVT (deep venous thrombosis) ([place]) 11/28/2013   HTN (hypertension) 05/04/2013   Lumbar back pain 05/04/2013   BMI 33.0-33.9,adult 05/04/2013   Hx of substance abuse ([place]) 05/04/2013    REFERRING DIAG:  M54.50 (ICD-10-CM) - Lumbar back pain  THERAPY DIAG:  No diagnosis found.  Rationale for Evaluation and Treatment Rehabilitation  PERTINENT HISTORY:  CAD, PE, DVT, HTN S/p lumbar interbody fusion 01/14/22  PRECAUTIONS: Back, abdominal and posterior incisions Per pt on 03/18/22, spine precautions lifted by surgeon at most recent visit     SUBJECTIVE:  SUBJECTIVE STATEMENT:  I felt good when I left last time. I like those pulling exercises. I need more of those. ***     PAIN:  Are you having pain: *** 5/10 Location: low back How would you describe your pain? Stiff, achey Best in past week: 4/10 with movement, no resting pain at best Worst in past week: 8/10 Aggravating factors: walking/standing household distances, prolonged sitting Easing factors: rest breaks, ice    OBJECTIVE: (objective measures completed at initial evaluation unless otherwise dated)    DIAGNOSTIC FINDINGS:  Post op interbody fusion  lumbar   PATIENT SURVEYS:  FOTO 32% FOTO 03/04/22 32% FOTO 03/31/22 35% 04/29/22: 37%    COGNITION: Overall cognitive status: Within functional limits for tasks assessed                          SENSATION: Light touch intact B LEs     POSTURE: increased kyphosis and forward head posture, UT elevation   PALPATION/OBSERVATION: Deferred as pt wears back brace to eval - states he saw MD this morning and no concerns; abdominal incision healed and posterior incision with mild drainage but WNL per pt report   LUMBAR ROM:    AROM 03/18/22  Flexion  75% distal to knee, mild pain  Extension  25% p!  Right lateral flexion    Left lateral flexion    Right rotation 50% stiffness  Left rotation  50% stiffness   (Blank rows = not tested) Comments: Deferred on eval given spine precautions 04/29/22: Deferred given time constraints and pt arriving with back brace donned     LOWER EXTREMITY MMT:     MMT Right eval Left eval Right 03/31/22 Left 03/31/22 R/L 04/29/22  Hip flexion 4p! 4p! 4+ P 4+ 5/5 (stiffness R side)  Hip abduction (modified sitting) 4+ 4+ 4+ 4+ 4+/4+  Hip internal rotation         Hip external rotation     4- 4 4+/4+  Knee flexion 5 4+ 5 5   Knee extension 4+ 4+ 5 5    (Blank rows = not tested)   Comments: mild pulling in hamstring with knee ext on R, no pain with resistance; mild pain with hip flex B that resolves with cessation of movement       FUNCTIONAL TESTS:  Sit to stand transfer, standard chair: heavy use of UE on rollator and chair, reduced fwd weight shift, wide BOS, significantly inc time and effort   TUG: 22 seconds from standard chair w/ rollator, gait mechanics as below, heavy UE use  02/16/22: TUG 12sec standard chair with rollator, continues with impaired gait mechanics and heavy UE use, fwd flexed posture  2 minute walk test: 03/31/22: 295 Feet  2MWT 04/29/22: 441f w/ rollator  3876fgait no AD, RPE 5/10, limited by fatigue, SBA 05/11/22: Tandem stance  30 sec      BERG BALANCE: 43/56 (04/23/22)  Sitting to Standing: Numbers; 0-4: 3  4. Stands without using hands and stabilize independently  3. Stands independently using hands  2. Stands using hands after multiple trials  1. Min A to stand  0. Mod-Max A to stand Standing unsupported: Numbers; 0-4: 4  4. Stands safely for 2 minutes  3. Stands 2 minutes with supervision  2. Stands 30 seconds unsupported  1. Needs several tries to stand unsupported for 30 seconds  0. Unable to stand unsupported for 30 seconds Sitting unsupported: Numbers; 0-4: 4  4.  Sits for 2 minutes independently  3. Sits for 2 minutes with supervision  2. Able to sit 30 seconds  1. Able to sit 10 seconds  0. Unable to sit for 10 seconds Standing to Sitting: Numbers; 0-4: 3 4. Sits safely with minimal use of hands 3. Controls descent with hands 2. Uses back of legs against chair to control descent 1. Sits independently, but uncontrolled descent 0. Needs assistance Transfers: Numbers; 0-4: 3  4. Transfers safely with minor use of hands  3. Transfers safely definite use of hands  2. Transfers with verbal cueing/supervision  1. Needs 1 person assist  0. Needs 2 person assist  Standing with eyes closed: Numbers; 0-4: 4  4. Stands safely for 10 seconds  3. Stands 10 seconds with supervision   2. Able to stand for 3 seconds  1. Unable to keep eyes closed for 3 seconds, but is safe  0. Needs assist to keep from falling Standing with feet together: Numbers; 0-4: 4 4. Stands for 1 minute safely 3. Stands for 1 minute with supervision 2. Unable to hold for 30 seconds  1. Needs help to attain position but can hold for 15 seconds  0. Needs help to attain position and unable to hold for 15 seconds Reaching forward with outstretched arm: Numbers; 0-4: 3  4. Reaches forward 10 inches  3. Reaches forward 5 inches  2. Reaches forward 2 inches  1. Reaches forward with supervision  0. Loses balance/requires  assistace Retrieving object from the floor: Numbers; 0-4: 3 4. Able to pick up easily and safely 3. Able to pick up with supervision 2. Unable to pick up, but reaches within 1-2 inches independently 1. Unable to pick up and needs supervision 0. Unable/needs assistance to keep from falling  Turning to look behind: Numbers; 0-4: 2  4. Looks behind from both sides and weight shifts well  3. Looks behind one side only, other side less weight shift  2. Turns sideways only, maintains balance  1. Needs supervision when turning  0. Needs assistance  Turning 360 degrees: Numbers; 0-4: 2  4. Able to turn in </=4 seconds  3. Able to turn on one side in </= 4 seconds   2. Able to turn slowly, but safely  1. Needs supervision or verbal cueing  0. Needs assistance Place alternate foot on stool: Numbers; 0-4: 4 4. Completes 8 steps in 20 seconds 3. Completes 8 steps in >20 seconds 2. 4 steps without assistance/supervision 1. Completes >2 steps with minimal assist 0. Unable, needs assist to keep from falling Standing with one foot in front: Numbers; 0-4: 1  4. Independent tandem for 30 seconds  3. Independent foot ahead for 30 seconds  2. Independent small step for 30 seconds  1. Needs help to step, but can hold for 15 seconds  0. Loses balance while standing/stepping Standing on one foot: Numbers; 0-4: 3 4. Holds >10 seconds 3. Holds 5-10 seconds 2. Holds >/=3 seconds  1. Holds <3 seconds 0. Unable   Total Score: 43/56     GAIT: Distance walked: within clinic Assistive device utilized: rollator Level of assistance: Modified independence Comments: significant forward flexed posture, partial step through pattern with intermittent step to, reduced hip extension B, reduced knee ROM throughout swing/stance    TODAY'S TREATMENT:  [facility] Adult PT Treatment:  DATE: 05/22/22 Therapeutic Exercise: *** Manual Therapy: *** Neuromuscular  re-ed: *** Therapeutic Activity: *** Modalities: *** Self Care: ***   [facility] Adult PT Treatment:                                                DATE: 05/12/22 Therapeutic Exercise: Nustep L6 x 5 min Black band rows Black band ext  [employer] press black single band  Leg press 30# 50# x 20 each bialteral  6 inch step ups x 10 each with bil Ue Heel raises and toe raises Cybex hip abduction 17.5 lbs 10 x 2 each  Spring board pull downs x 20  Upated HEP   [facility] Adult PT Treatment:                                                DATE: 05/11/22 Therapeutic Exercise: Nustep L 5 5 min Heel raises Alt hip flexion x 20 Hip abduction 10 x 2 Retro walking at counter 10 ft x 4 Green band row  x20 Shoulder ext Green x 20 Pilates Spring board pull downs x 10   Therapeutic Activity: Tandem stance 30 sec each AIREX marching without UE Airex narrow with head turns and trunk rotations  PATIENT EDUCATION:  Education details: rationale for interventions Person educated: Patient Education method: Explanation, Demonstration, Tactile cues, Verbal cues Education comprehension: verbalized understanding, returned demonstration, verbal cues required, tactile cues required, and needs further education    HOME EXERCISE PROGRAM: Access Code: [email or web address] URL: [email or web address] Date: 03/18/2022 Prepared by: Enis Slipper  Exercises - Sit to Stand with Armchair  - 1 x daily - 7 x weekly - 2 sets - 10 reps - Standing March with Counter Support  - 1 x daily - 7 x weekly - 2 sets - 8 reps - Standing Hip Extension with Counter Support  - 1 x daily - 7 x weekly - 2 sets - 8 reps - Heel Raises with Counter Support  - 1 x daily - 7 x weekly - 2 sets - 10 reps 05/12/22 - Standing Row with Anchored Resistance  - 1 x daily - 7 x weekly - 2-3 sets - 10 reps - 5 hold - Shoulder extension with resistance - Neutral  - 1 x daily - 7 x weekly - 2-3 sets - 10 reps - 5 hold - Standing Anti-Rotation  Press with Anchored Resistance  - 1 x daily - 7 x weekly - 1-2 sets - 10 reps - 3-5 hold   ASSESSMENT:   CLINICAL IMPRESSION: ***  *** Pt reports min pain on arrival. Continued with closed chain tolerance and he did well with one seated rest break. Updated HEP with standing core strength. He reported good tolerance to the session.     OBJECTIVE IMPAIRMENTS: Abnormal gait, decreased activity tolerance, decreased balance, decreased endurance, decreased mobility, difficulty walking, decreased ROM, decreased strength, hypomobility, improper body mechanics, postural dysfunction, and pain.    ACTIVITY LIMITATIONS: carrying, lifting, bending, sitting, standing, squatting, sleeping, stairs, transfers, bed mobility, bathing, toileting, dressing, and locomotion level   PARTICIPATION LIMITATIONS: meal prep, cleaning, laundry, shopping, community activity, and yard work   PERSONAL FACTORS: Time since onset of injury/illness/exacerbation and 3+ comorbidities: CAD, PE, DVT, HTN  are also affecting patient's functional outcome.    REHAB POTENTIAL: Good   CLINICAL DECISION MAKING: Stable/uncomplicated   EVALUATION COMPLEXITY: Low     GOALS: Goals reviewed with patient? Yes   SHORT TERM GOALS: Target date: 02/25/2022   Pt will demonstrate appropriate understanding and performance of initially prescribed HEP in order to facilitate improved independence with management of symptoms.  Baseline: HEP provided on eval 03/04/22: Pt reports independence w/ HEP Goal status: MET   2. Pt will score greater than or equal to 42% on FOTO in order to demonstrate improved perception of function due to symptoms.            Baseline: 32% 03/04/22: 32% 03/31/22: 35% 04/29/22: 37%             Goal status: ONGOING   LONG TERM GOALS: Target date: 05/29/22 (updated 04/29/22)              Pt will score 51% on FOTO in order to demonstrate improved perception of functional status due to symptoms.  Baseline: 32% 03/31/22:  35% 04/29/22: 37%  Goal status: ONGOING     2.  Pt will demonstrate ability to ambulate at least 1078f with LRAD and no more than 2pt increase in baseline pain on NPS in order to facilitate improved tolerance to community ambulation.  Baseline: pt unable to tolerate greater than household distances due to significant inc in pain 03/31/22: able to complete 2 minute walk test at 295 feet with increased pain from 5/10 to 7/10.  04/29/22: [id number]/ rollator no increase in pain 2MWT, significant fatigue; [id number] AD limited by fatigue    Goal status: PROGRESSING   3.  Pt will demonstrate hip flexion MMT of 5/5 in order to facilitate improved clearance with gait for reduced fall risk.  Baseline: 4/5 B w pain 03/31/21: see chart 04/29/22: see chart Goal status: MET   4. Pt will be able to perform TUG in less than or equal to 13 sec with LRAD in order to indicate reduced risk of falling (cutoff score for fall risk 13.5 sec in community dwelling older adults per [facility] al, 2000)            Baseline: 22sec with rollator  02/16/22: 12sec with rollator             Goal status: MET   5. Pt will score >47/56 on BERG to indicate reduced fall risk and increased safety w/ mobility.   04/23/22: 43/56  Goal status: NEW (04/29/22)   PLAN (updated 04/29/22):   PT FREQUENCY: 2x/week   PT DURATION: 4 weeks   PLANNED INTERVENTIONS: Therapeutic exercises, Therapeutic activity, Neuromuscular re-education, Balance training, Gait training, Patient/Family education, Self Care, Stair training, DME instructions, Aquatic Therapy, Cryotherapy, Moist heat, Taping, Manual therapy, and Re-evaluation.   PLAN FOR NEXT SESSION:   Review/update HEP (hip mobility). Gait/functional mobility. Consider DGI for higher level balance ***   DavLeeroy Cha, DPT 05/21/2022 9:50 AM

## 2022-05-21 NOTE — Telephone Encounter (Signed)
Called pt re: this morning's missed appt - no answer, left voicemail w/ office call back number and confirmed date/time of next appt

## 2022-05-22 ENCOUNTER — Ambulatory Visit: Payer: BLUE CROSS/BLUE SHIELD | Admitting: Physical Therapy

## 2022-05-25 ENCOUNTER — Encounter: Payer: Self-pay | Admitting: Physical Therapy

## 2022-05-25 ENCOUNTER — Ambulatory Visit: Payer: BLUE CROSS/BLUE SHIELD | Admitting: Physical Therapy

## 2022-05-25 DIAGNOSIS — M5459 Other low back pain: Secondary | ICD-10-CM | POA: Diagnosis not present

## 2022-05-25 DIAGNOSIS — M6281 Muscle weakness (generalized): Secondary | ICD-10-CM

## 2022-05-25 DIAGNOSIS — R2689 Other abnormalities of gait and mobility: Secondary | ICD-10-CM

## 2022-05-25 NOTE — Therapy (Signed)
OUTPATIENT PHYSICAL THERAPY TREATMENT NOTE    [name] MRN: [id number] DOB:Dec 08, 1965, 57 y.o., male Today's Date: 05/25/2022     PCP: Holley Bouche, MD   REFERRING PROVIDER: Judith Part, MD  END OF SESSION:   PT End of Session - 05/25/22 0846     Visit Number 13    Number of Visits 25    Date for PT Re-Evaluation 06/24/22    Authorization Type [place] [employer]    Authorization Time Period auth after 25th visit, FOTO v10    Authorization - Visit Number 13    Authorization - Number of Visits 25    Progress Note Due on Visit 20    PT Start Time 0846    PT Stop Time 0926    PT Time Calculation (min) 40 min    Activity Tolerance Patient tolerated treatment well;No increased pain    Behavior During Therapy WFL for tasks assessed/performed              Past Medical History:  Diagnosis Date   Acute pulmonary embolus (HCC)    bilateral submassive PE in setting of missing several doses of Xarelto for his DVT   Arthritis    DVT (deep venous thrombosis) ([place]) 11/28/2013   RT LEG   High cholesterol    Hypertension    Lumbar spine scoliosis    Marijuana use    Sleep apnea    Past Surgical History:  Procedure Laterality Date   ABDOMINAL EXPOSURE N/A 01/14/2022   Procedure: ABDOMINAL EXPOSURE;  Surgeon: Marty Heck, MD;  Location: [facility];  Service: Vascular;  Laterality: N/A;   ANTERIOR LUMBAR FUSION Left 01/14/2022   Procedure: Lumbar three-four Lumbar four-five Oblique Lumbar Interbody Fusion;  Surgeon: Judith Part, MD;  Location: [place];  Service: Neurosurgery;  Laterality: Left;   HERNIA REPAIR     [id number] and [id number]; umbilical hernia repair   KNEE SURGERY Bilateral    Left knee 1993, right knee 2004   TONSILLECTOMY     TOTAL KNEE ARTHROPLASTY Left 02/10/2017   Procedure: LEFT TOTAL KNEE ARTHROPLASTY;  Surgeon: Leandrew Koyanagi, MD;  Location: [facility];  Service: Orthopedics;  Laterality: Left;   TOTAL KNEE ARTHROPLASTY Right 07/28/2017    Procedure: RIGHT TOTAL KNEE ARTHROPLASTY;  Surgeon: Leandrew Koyanagi, MD;  Location: [place];  Service: Orthopedics;  Laterality: Right;   TRANSFORAMINAL LUMBAR INTERBODY FUSION W/ MIS 1 LEVEL N/A 01/14/2022   Procedure: OPEN TRANSFORAMINAL LUMBAR INTERBODY FUSION  LUMBAR FIVE-SACRAL ONE ,LAMINECTOMIES AND POSTERIOR LATERAL INSTRUMENTATION FUSION  LUMBAR THREE TO FIVE SACRAL ONE;  Surgeon: Judith Part, MD;  Location: [facility];  Service: Neurosurgery;  Laterality: N/A;   Patient Active Problem List   Diagnosis Date Noted   Leg swelling 02/23/2022   Scoliosis 01/14/2022   Snoring 09/22/2021   Insomnia due to mental condition 09/22/2021   At risk for mood disturbance 09/01/2021   Sleep disturbance 07/28/2021   Chronic bilateral low back pain without sciatica 07/25/2019   Neurogenic claudication 05/19/2019   Hyperlipidemia LDL goal <70 07/20/2018   History of pulmonary embolus (PE) 07/20/2017   Mild CAD 07/20/2017   S/p total knee replacement, bilateral 02/10/2017   Chronic anticoagulation 01/26/2017   Pulmonary embolism ([place]) 06/03/2016   Encounter for chronic pain management 04/06/2014   Tobacco abuse 12/05/2013   DVT (deep venous thrombosis) ([place]) 11/28/2013   HTN (hypertension) 05/04/2013   Lumbar back pain 05/04/2013   BMI 33.0-33.9,adult 05/04/2013  Hx of substance abuse ([place]) 05/04/2013    REFERRING DIAG:  M54.50 (ICD-10-CM) - Lumbar back pain  THERAPY DIAG:  Other low back pain  Muscle weakness (generalized)  Other abnormalities of gait and mobility  Rationale for Evaluation and Treatment Rehabilitation  PERTINENT HISTORY:  CAD, PE, DVT, HTN S/p lumbar interbody fusion 01/14/22  PRECAUTIONS: Back, abdominal and posterior incisions Per pt on 03/18/22, spine precautions lifted by surgeon at most recent visit     SUBJECTIVE:                                                                                                                                                                                       SUBJECTIVE STATEMENT:  No issues after last session, felt a bit under the weather over the weekend but no covid. Has been trying to use the walking stick more than the rollator    PAIN:  Are you having pain:  5/10 Location: low back How would you describe your pain? Stiff, achey Best in past week: 4/10 with movement, no resting pain at best Worst in past week: 8/10 Aggravating factors: walking/standing household distances, prolonged sitting Easing factors: rest breaks, ice    OBJECTIVE: (objective measures completed at initial evaluation unless otherwise dated)    DIAGNOSTIC FINDINGS:  Post op interbody fusion lumbar   PATIENT SURVEYS:  FOTO 32% FOTO 03/04/22 32% FOTO 03/31/22 35% 04/29/22: 37%    COGNITION: Overall cognitive status: Within functional limits for tasks assessed                          SENSATION: Light touch intact B LEs     POSTURE: increased kyphosis and forward head posture, UT elevation   PALPATION/OBSERVATION: Deferred as pt wears back brace to eval - states he saw MD this morning and no concerns; abdominal incision healed and posterior incision with mild drainage but WNL per pt report   LUMBAR ROM:    AROM 03/18/22  Flexion  75% distal to knee, mild pain  Extension  25% p!  Right lateral flexion    Left lateral flexion    Right rotation 50% stiffness  Left rotation  50% stiffness   (Blank rows = not tested) Comments: Deferred on eval given spine precautions 04/29/22: Deferred given time constraints and pt arriving with back brace donned     LOWER EXTREMITY MMT:     MMT Right eval Left eval Right 03/31/22 Left 03/31/22 R/L 04/29/22  Hip flexion 4p! 4p! 4+ P 4+ 5/5 (stiffness R side)  Hip abduction (modified sitting) 4+ 4+ 4+ 4+ 4+/4+  Hip internal rotation  Hip external rotation     4- 4 4+/4+  Knee flexion 5 4+ 5 5   Knee extension 4+ 4+ 5 5    (Blank rows = not tested)   Comments: mild  pulling in hamstring with knee ext on R, no pain with resistance; mild pain with hip flex B that resolves with cessation of movement       FUNCTIONAL TESTS:  Sit to stand transfer, standard chair: heavy use of UE on rollator and chair, reduced fwd weight shift, wide BOS, significantly inc time and effort   TUG: 22 seconds from standard chair w/ rollator, gait mechanics as below, heavy UE use  02/16/22: TUG 12sec standard chair with rollator, continues with impaired gait mechanics and heavy UE use, fwd flexed posture  2 minute walk test: 03/31/22: 295 Feet  2MWT 04/29/22: 410f w/ rollator  3851fgait no AD, RPE 5/10, limited by fatigue, SBA 05/11/22: Tandem stance 30 sec      BERG BALANCE: 43/56 (04/23/22)  Sitting to Standing: Numbers; 0-4: 3  4. Stands without using hands and stabilize independently  3. Stands independently using hands  2. Stands using hands after multiple trials  1. Min A to stand  0. Mod-Max A to stand Standing unsupported: Numbers; 0-4: 4  4. Stands safely for 2 minutes  3. Stands 2 minutes with supervision  2. Stands 30 seconds unsupported  1. Needs several tries to stand unsupported for 30 seconds  0. Unable to stand unsupported for 30 seconds Sitting unsupported: Numbers; 0-4: 4  4. Sits for 2 minutes independently  3. Sits for 2 minutes with supervision  2. Able to sit 30 seconds  1. Able to sit 10 seconds  0. Unable to sit for 10 seconds Standing to Sitting: Numbers; 0-4: 3 4. Sits safely with minimal use of hands 3. Controls descent with hands 2. Uses back of legs against chair to control descent 1. Sits independently, but uncontrolled descent 0. Needs assistance Transfers: Numbers; 0-4: 3  4. Transfers safely with minor use of hands  3. Transfers safely definite use of hands  2. Transfers with verbal cueing/supervision  1. Needs 1 person assist  0. Needs 2 person assist  Standing with eyes closed: Numbers; 0-4: 4  4. Stands safely for 10  seconds  3. Stands 10 seconds with supervision   2. Able to stand for 3 seconds  1. Unable to keep eyes closed for 3 seconds, but is safe  0. Needs assist to keep from falling Standing with feet together: Numbers; 0-4: 4 4. Stands for 1 minute safely 3. Stands for 1 minute with supervision 2. Unable to hold for 30 seconds  1. Needs help to attain position but can hold for 15 seconds  0. Needs help to attain position and unable to hold for 15 seconds Reaching forward with outstretched arm: Numbers; 0-4: 3  4. Reaches forward 10 inches  3. Reaches forward 5 inches  2. Reaches forward 2 inches  1. Reaches forward with supervision  0. Loses balance/requires assistace Retrieving object from the floor: Numbers; 0-4: 3 4. Able to pick up easily and safely 3. Able to pick up with supervision 2. Unable to pick up, but reaches within 1-2 inches independently 1. Unable to pick up and needs supervision 0. Unable/needs assistance to keep from falling  Turning to look behind: Numbers; 0-4: 2  4. Looks behind from both sides and weight shifts well  3. Looks behind one side only, other side less  weight shift  2. Turns sideways only, maintains balance  1. Needs supervision when turning  0. Needs assistance  Turning 360 degrees: Numbers; 0-4: 2  4. Able to turn in </=4 seconds  3. Able to turn on one side in </= 4 seconds   2. Able to turn slowly, but safely  1. Needs supervision or verbal cueing  0. Needs assistance Place alternate foot on stool: Numbers; 0-4: 4 4. Completes 8 steps in 20 seconds 3. Completes 8 steps in >20 seconds 2. 4 steps without assistance/supervision 1. Completes >2 steps with minimal assist 0. Unable, needs assist to keep from falling Standing with one foot in front: Numbers; 0-4: 1  4. Independent tandem for 30 seconds  3. Independent foot ahead for 30 seconds  2. Independent small step for 30 seconds  1. Needs help to step, but can hold for 15 seconds  0. Loses  balance while standing/stepping Standing on one foot: Numbers; 0-4: 3 4. Holds >10 seconds 3. Holds 5-10 seconds 2. Holds >/=3 seconds  1. Holds <3 seconds 0. Unable   Total Score: 43/56     GAIT: Distance walked: within clinic Assistive device utilized: rollator Level of assistance: Modified independence Comments: significant forward flexed posture, partial step through pattern with intermittent step to, reduced hip extension B, reduced knee ROM throughout swing/stance    TODAY'S TREATMENT:  [facility] Adult PT Treatment:                                                DATE: 05/25/22 Therapeutic Exercise: Nu step LE/UE 70mn during subjective Blue band row x20 Blue band shoulder ext x20 cues for reduced compensation at triceps Blue band paloff + OH press x10 each cues for form and posture Blue band chest press unilat x8 each UE cues for form and posture Triple flex/ext unilat UE support on counter x10 each cues for posture and reduced compensations Seated hip IR with RTB and block for fulcrum 2x10 cues for full ROM and reduced compensation Seated hip IR AAROM w/ UE assist x10 each LE cues for positioning and proper movement    [facility] Adult PT Treatment:                                                DATE: 05/12/22 Therapeutic Exercise: Nustep L6 x 5 min Black band rows Black band ext  POmnicarepress black single band  Leg press 30# 50# x 20 each bialteral  6 inch step ups x 10 each with bil Ue Heel raises and toe raises Cybex hip abduction 17.5 lbs 10 x 2 each  Spring board pull downs x 20  Upated HEP   [facility] Adult PT Treatment:                                                DATE: 05/11/22 Therapeutic Exercise: Nustep L 5 5 min Heel raises Alt hip flexion x 20 Hip abduction 10 x 2 Retro walking at counter 10 ft x 4 Green band row  x20 Shoulder ext Green x 20 Pilates Spring board  pull downs x 10   Therapeutic Activity: Tandem stance 30 sec each AIREX marching without  UE Airex narrow with head turns and trunk rotations  PATIENT EDUCATION:  Education details: rationale for interventions Person educated: Patient Education method: Explanation, Demonstration, Tactile cues, Verbal cues Education comprehension: verbalized understanding, returned demonstration, verbal cues required, tactile cues required, and needs further education    HOME EXERCISE PROGRAM: Access Code: [email or web address] URL: [email or web address] Date: 03/18/2022 Prepared by: Enis Slipper  Exercises - Sit to Stand with Armchair  - 1 x daily - 7 x weekly - 2 sets - 10 reps - Standing March with Counter Support  - 1 x daily - 7 x weekly - 2 sets - 8 reps - Standing Hip Extension with Counter Support  - 1 x daily - 7 x weekly - 2 sets - 8 reps - Heel Raises with Counter Support  - 1 x daily - 7 x weekly - 2 sets - 10 reps 05/12/22 - Standing Row with Anchored Resistance  - 1 x daily - 7 x weekly - 2-3 sets - 10 reps - 5 hold - Shoulder extension with resistance - Neutral  - 1 x daily - 7 x weekly - 2-3 sets - 10 reps - 5 hold - Standing Anti-Rotation Press with Anchored Resistance  - 1 x daily - 7 x weekly - 1-2 sets - 10 reps - 3-5 hold   ASSESSMENT:   CLINICAL IMPRESSION: Pt arrives w/ 5/10 pain, continues to report slow/steady progress. Continued to progress standing tolerance and exercises emphasizing core stability and upright posture. Cues as above. No adverse events or increases in pain. Continues to demo preference for externally rotated positioning of hips which is addressed w/ IR strengthening/ROM as above. Pt departs today's session in no acute distress, all voiced questions/concerns addressed appropriately from PT perspective.      OBJECTIVE IMPAIRMENTS: Abnormal gait, decreased activity tolerance, decreased balance, decreased endurance, decreased mobility, difficulty walking, decreased ROM, decreased strength, hypomobility, improper body mechanics, postural dysfunction,  and pain.    ACTIVITY LIMITATIONS: carrying, lifting, bending, sitting, standing, squatting, sleeping, stairs, transfers, bed mobility, bathing, toileting, dressing, and locomotion level   PARTICIPATION LIMITATIONS: meal prep, cleaning, laundry, shopping, community activity, and yard work   PERSONAL FACTORS: Time since onset of injury/illness/exacerbation and 3+ comorbidities: CAD, PE, DVT, HTN  are also affecting patient's functional outcome.    REHAB POTENTIAL: Good   CLINICAL DECISION MAKING: Stable/uncomplicated   EVALUATION COMPLEXITY: Low     GOALS: Goals reviewed with patient? Yes   SHORT TERM GOALS: Target date: 02/25/2022   Pt will demonstrate appropriate understanding and performance of initially prescribed HEP in order to facilitate improved independence with management of symptoms.  Baseline: HEP provided on eval 03/04/22: Pt reports independence w/ HEP Goal status: MET   2. Pt will score greater than or equal to 42% on FOTO in order to demonstrate improved perception of function due to symptoms.            Baseline: 32% 03/04/22: 32% 03/31/22: 35% 04/29/22: 37%             Goal status: ONGOING   LONG TERM GOALS: Target date: 05/29/22 (updated 04/29/22)              Pt will score 51% on FOTO in order to demonstrate improved perception of functional status due to symptoms.  Baseline: 32% 03/31/22: 35% 04/29/22: 37%  Goal status: ONGOING     2.  Pt will demonstrate  ability to ambulate at least 1033f with LRAD and no more than 2pt increase in baseline pain on NPS in order to facilitate improved tolerance to community ambulation.  Baseline: pt unable to tolerate greater than household distances due to significant inc in pain 03/31/22: able to complete 2 minute walk test at 295 feet with increased pain from 5/10 to 7/10.  04/29/22: [id number]/ rollator no increase in pain 2MWT, significant fatigue; 3804fo AD limited by fatigue    Goal status: PROGRESSING   3.  Pt will  demonstrate hip flexion MMT of 5/5 in order to facilitate improved clearance with gait for reduced fall risk.  Baseline: 4/5 B w pain 03/31/21: see chart 04/29/22: see chart Goal status: MET   4. Pt will be able to perform TUG in less than or equal to 13 sec with LRAD in order to indicate reduced risk of falling (cutoff score for fall risk 13.5 sec in community dwelling older adults per [facility], 2000)            Baseline: 22sec with rollator  02/16/22: 12sec with rollator             Goal status: MET   5. Pt will score >47/56 on BERG to indicate reduced fall risk and increased safety w/ mobility.   04/23/22: 43/56  Goal status: NEW (04/29/22)   PLAN (updated 04/29/22):   PT FREQUENCY: 2x/week   PT DURATION: 4 weeks   PLANNED INTERVENTIONS: Therapeutic exercises, Therapeutic activity, Neuromuscular re-education, Balance training, Gait training, Patient/Family education, Self Care, Stair training, DME instructions, Aquatic Therapy, Cryotherapy, Moist heat, Taping, Manual therapy, and Re-evaluation.   PLAN FOR NEXT SESSION:   Review/update HEP (hip mobility). Gait/functional mobility. Consider DGI for higher level balance    DavLeeroy Cha, DPT 05/25/2022 10:17 AM

## 2022-05-26 NOTE — Therapy (Signed)
OUTPATIENT PHYSICAL THERAPY TREATMENT NOTE    [name] MRN: [id number] DOB:04-16-1965, 57 y.o., male Today's Date: 05/27/2022     PCP: Holley Bouche, MD   REFERRING PROVIDER: Judith Part, MD  END OF SESSION:   PT End of Session - 05/27/22 0844     Visit Number 14    Number of Visits 25    Date for PT Re-Evaluation 06/24/22    Authorization Type [place] [employer]    Authorization Time Period auth after 25th visit, FOTO v10    Authorization - Visit Number 14    Authorization - Number of Visits 25    Progress Note Due on Visit 20    PT Start Time 0845    PT Stop Time 0925    PT Time Calculation (min) 40 min    Activity Tolerance Patient tolerated treatment well;No increased pain    Behavior During Therapy WFL for tasks assessed/performed               Past Medical History:  Diagnosis Date   Acute pulmonary embolus (HCC)    bilateral submassive PE in setting of missing several doses of Xarelto for his DVT   Arthritis    DVT (deep venous thrombosis) ([place]) 11/28/2013   RT LEG   High cholesterol    Hypertension    Lumbar spine scoliosis    Marijuana use    Sleep apnea    Past Surgical History:  Procedure Laterality Date   ABDOMINAL EXPOSURE N/A 01/14/2022   Procedure: ABDOMINAL EXPOSURE;  Surgeon: Marty Heck, MD;  Location: [facility];  Service: Vascular;  Laterality: N/A;   ANTERIOR LUMBAR FUSION Left 01/14/2022   Procedure: Lumbar three-four Lumbar four-five Oblique Lumbar Interbody Fusion;  Surgeon: Judith Part, MD;  Location: [facility];  Service: Neurosurgery;  Laterality: Left;   HERNIA REPAIR     [id number] and [id number]; umbilical hernia repair   KNEE SURGERY Bilateral    Left knee 1993, right knee 2004   TONSILLECTOMY     TOTAL KNEE ARTHROPLASTY Left 02/10/2017   Procedure: LEFT TOTAL KNEE ARTHROPLASTY;  Surgeon: Leandrew Koyanagi, MD;  Location: [facility];  Service: Orthopedics;  Laterality: Left;   TOTAL KNEE ARTHROPLASTY Right  07/28/2017   Procedure: RIGHT TOTAL KNEE ARTHROPLASTY;  Surgeon: Leandrew Koyanagi, MD;  Location: [facility];  Service: Orthopedics;  Laterality: Right;   TRANSFORAMINAL LUMBAR INTERBODY FUSION W/ MIS 1 LEVEL N/A 01/14/2022   Procedure: OPEN TRANSFORAMINAL LUMBAR INTERBODY FUSION  LUMBAR FIVE-SACRAL ONE ,LAMINECTOMIES AND POSTERIOR LATERAL INSTRUMENTATION FUSION  LUMBAR THREE TO FIVE SACRAL ONE;  Surgeon: Judith Part, MD;  Location: [facility];  Service: Neurosurgery;  Laterality: N/A;   Patient Active Problem List   Diagnosis Date Noted   Leg swelling 02/23/2022   Scoliosis 01/14/2022   Snoring 09/22/2021   Insomnia due to mental condition 09/22/2021   At risk for mood disturbance 09/01/2021   Sleep disturbance 07/28/2021   Chronic bilateral low back pain without sciatica 07/25/2019   Neurogenic claudication 05/19/2019   Hyperlipidemia LDL goal <70 07/20/2018   History of pulmonary embolus (PE) 07/20/2017   Mild CAD 07/20/2017   S/p total knee replacement, bilateral 02/10/2017   Chronic anticoagulation 01/26/2017   Pulmonary embolism ([place]) 06/03/2016   Encounter for chronic pain management 04/06/2014   Tobacco abuse 12/05/2013   DVT (deep venous thrombosis) ([place]) 11/28/2013   HTN (hypertension) 05/04/2013   Lumbar back pain 05/04/2013   BMI 33.0-33.9,adult 05/04/2013  Hx of substance abuse ([place]) 05/04/2013    REFERRING DIAG:  M54.50 (ICD-10-CM) - Lumbar back pain  THERAPY DIAG:  Other low back pain  Muscle weakness (generalized)  Other abnormalities of gait and mobility  Rationale for Evaluation and Treatment Rehabilitation  PERTINENT HISTORY:  CAD, PE, DVT, HTN S/p lumbar interbody fusion 01/14/22  PRECAUTIONS: Back, abdominal and posterior incisions Per pt on 03/18/22, spine precautions lifted by surgeon at most recent visit     SUBJECTIVE:                                                                                                                                                                                       SUBJECTIVE STATEMENT:  Pt arrives with a bit more pain today he attributes to weather, wearing brace. Felt good after last session    PAIN:  Are you having pain:  5/10 Location: low back How would you describe your pain? Stiff, achey Best in past week: 4/10 with movement, no resting pain at best Worst in past week: 8/10 Aggravating factors: walking/standing household distances, prolonged sitting Easing factors: rest breaks, ice    OBJECTIVE: (objective measures completed at initial evaluation unless otherwise dated)    DIAGNOSTIC FINDINGS:  Post op interbody fusion lumbar   PATIENT SURVEYS:  FOTO 32% FOTO 03/04/22 32% FOTO 03/31/22 35% 04/29/22: 37%  05/27/22: 40%   COGNITION: Overall cognitive status: Within functional limits for tasks assessed                          SENSATION: Light touch intact B LEs     POSTURE: increased kyphosis and forward head posture, UT elevation   PALPATION/OBSERVATION: Deferred as pt wears back brace to eval - states he saw MD this morning and no concerns; abdominal incision healed and posterior incision with mild drainage but WNL per pt report   LUMBAR ROM:    AROM 03/18/22  Flexion  75% distal to knee, mild pain  Extension  25% p!  Right lateral flexion    Left lateral flexion    Right rotation 50% stiffness  Left rotation  50% stiffness   (Blank rows = not tested) Comments: Deferred on eval given spine precautions 04/29/22: Deferred given time constraints and pt arriving with back brace donned     LOWER EXTREMITY MMT:     MMT Right eval Left eval Right 03/31/22 Left 03/31/22 R/L 04/29/22  Hip flexion 4p! 4p! 4+ P 4+ 5/5 (stiffness R side)  Hip abduction (modified sitting) 4+ 4+ 4+ 4+ 4+/4+  Hip internal rotation         Hip external  rotation     4- 4 4+/4+  Knee flexion 5 4+ 5 5   Knee extension 4+ 4+ 5 5    (Blank rows = not tested)   Comments: mild pulling in hamstring  with knee ext on R, no pain with resistance; mild pain with hip flex B that resolves with cessation of movement       FUNCTIONAL TESTS:  Sit to stand transfer, standard chair: heavy use of UE on rollator and chair, reduced fwd weight shift, wide BOS, significantly inc time and effort   TUG: 22 seconds from standard chair w/ rollator, gait mechanics as below, heavy UE use  02/16/22: TUG 12sec standard chair with rollator, continues with impaired gait mechanics and heavy UE use, fwd flexed posture  2 minute walk test: 03/31/22: 295 Feet  2MWT 04/29/22: 422f w/ rollator  3865fgait no AD, RPE 5/10, limited by fatigue, SBA 05/11/22: Tandem stance 30 sec      BERG BALANCE: 43/56 (04/23/22)  Sitting to Standing: Numbers; 0-4: 3  4. Stands without using hands and stabilize independently  3. Stands independently using hands  2. Stands using hands after multiple trials  1. Min A to stand  0. Mod-Max A to stand Standing unsupported: Numbers; 0-4: 4  4. Stands safely for 2 minutes  3. Stands 2 minutes with supervision  2. Stands 30 seconds unsupported  1. Needs several tries to stand unsupported for 30 seconds  0. Unable to stand unsupported for 30 seconds Sitting unsupported: Numbers; 0-4: 4  4. Sits for 2 minutes independently  3. Sits for 2 minutes with supervision  2. Able to sit 30 seconds  1. Able to sit 10 seconds  0. Unable to sit for 10 seconds Standing to Sitting: Numbers; 0-4: 3 4. Sits safely with minimal use of hands 3. Controls descent with hands 2. Uses back of legs against chair to control descent 1. Sits independently, but uncontrolled descent 0. Needs assistance Transfers: Numbers; 0-4: 3  4. Transfers safely with minor use of hands  3. Transfers safely definite use of hands  2. Transfers with verbal cueing/supervision  1. Needs 1 person assist  0. Needs 2 person assist  Standing with eyes closed: Numbers; 0-4: 4  4. Stands safely for 10 seconds  3. Stands 10  seconds with supervision   2. Able to stand for 3 seconds  1. Unable to keep eyes closed for 3 seconds, but is safe  0. Needs assist to keep from falling Standing with feet together: Numbers; 0-4: 4 4. Stands for 1 minute safely 3. Stands for 1 minute with supervision 2. Unable to hold for 30 seconds  1. Needs help to attain position but can hold for 15 seconds  0. Needs help to attain position and unable to hold for 15 seconds Reaching forward with outstretched arm: Numbers; 0-4: 3  4. Reaches forward 10 inches  3. Reaches forward 5 inches  2. Reaches forward 2 inches  1. Reaches forward with supervision  0. Loses balance/requires assistace Retrieving object from the floor: Numbers; 0-4: 3 4. Able to pick up easily and safely 3. Able to pick up with supervision 2. Unable to pick up, but reaches within 1-2 inches independently 1. Unable to pick up and needs supervision 0. Unable/needs assistance to keep from falling  Turning to look behind: Numbers; 0-4: 2  4. Looks behind from both sides and weight shifts well  3. Looks behind one side only, other side less weight shift  2. Turns sideways only, maintains balance  1. Needs supervision when turning  0. Needs assistance  Turning 360 degrees: Numbers; 0-4: 2  4. Able to turn in </=4 seconds  3. Able to turn on one side in </= 4 seconds   2. Able to turn slowly, but safely  1. Needs supervision or verbal cueing  0. Needs assistance Place alternate foot on stool: Numbers; 0-4: 4 4. Completes 8 steps in 20 seconds 3. Completes 8 steps in >20 seconds 2. 4 steps without assistance/supervision 1. Completes >2 steps with minimal assist 0. Unable, needs assist to keep from falling Standing with one foot in front: Numbers; 0-4: 1  4. Independent tandem for 30 seconds  3. Independent foot ahead for 30 seconds  2. Independent small step for 30 seconds  1. Needs help to step, but can hold for 15 seconds  0. Loses balance while  standing/stepping Standing on one foot: Numbers; 0-4: 3 4. Holds >10 seconds 3. Holds 5-10 seconds 2. Holds >/=3 seconds  1. Holds <3 seconds 0. Unable   Total Score: 43/56     GAIT: Distance walked: within clinic Assistive device utilized: rollator Level of assistance: Modified independence Comments: significant forward flexed posture, partial step through pattern with intermittent step to, reduced hip extension B, reduced knee ROM throughout swing/stance    TODAY'S TREATMENT:  [facility] Adult PT Treatment:                                                DATE: 05/27/22 Therapeutic Exercise: Nu step L7 6 min UE/LE during subjective Hip IR AAROM seated 2x10 each LE cues for form and comfortable  Cable column paloff press x10 7# cues for posture  Cable column unilat row 10# x10 B UE cues for setup Cable column unilat chest press 10# x10 each UE cues for form, trunk positioning, and appropriate core activation  Therapeutic Activity: FOTO + education STS superset with hip IR AAROM (see above) 3x5 from mat + airex, requires significant rest breaks due to increased fatigue with neutral positioning Staggered stance weight shift (partial lunge) for improved mechanics, reduced ER at hips x8 each LE    [facility] Adult PT Treatment:                                                DATE: 05/25/22 Therapeutic Exercise: Nu step LE/UE 55mn during subjective Blue band row x20 Blue band shoulder ext x20 cues for reduced compensation at triceps Blue band paloff + OH press x10 each cues for form and posture Blue band chest press unilat x8 each UE cues for form and posture Triple flex/ext unilat UE support on counter x10 each cues for posture and reduced compensations Seated hip IR with RTB and block for fulcrum 2x10 cues for full ROM and reduced compensation Seated hip IR AAROM w/ UE assist x10 each LE cues for positioning and proper movement    [facility] Adult PT Treatment:  DATE: 05/12/22 Therapeutic Exercise: Nustep L6 x 5 min Black band rows Black band ext  Palloff press black single band  Leg press 30# 50# x 20 each bialteral  6 inch step ups x 10 each with bil Ue Heel raises and toe raises Cybex hip abduction 17.5 lbs 10 x 2 each  Spring board pull downs x 20  Upated HEP   PATIENT EDUCATION:  Education details: rationale for interventions Person educated: Patient Education method: Explanation, Demonstration, Tactile cues, Verbal cues Education comprehension: verbalized understanding, returned demonstration, verbal cues required, tactile cues required, and needs further education    HOME EXERCISE PROGRAM: Access Code: [email or web address] URL: [email or web address] Date: 03/18/2022 Prepared by: Enis Slipper  Exercises - Sit to Stand with Armchair  - 1 x daily - 7 x weekly - 2 sets - 10 reps - Standing March with Counter Support  - 1 x daily - 7 x weekly - 2 sets - 8 reps - Standing Hip Extension with Counter Support  - 1 x daily - 7 x weekly - 2 sets - 8 reps - Heel Raises with Counter Support  - 1 x daily - 7 x weekly - 2 sets - 10 reps 05/12/22 - Standing Row with Anchored Resistance  - 1 x daily - 7 x weekly - 2-3 sets - 10 reps - 5 hold - Shoulder extension with resistance - Neutral  - 1 x daily - 7 x weekly - 2-3 sets - 10 reps - 5 hold - Standing Anti-Rotation Press with Anchored Resistance  - 1 x daily - 7 x weekly - 1-2 sets - 10 reps - 3-5 hold   ASSESSMENT:   CLINICAL IMPRESSION: Pt arrives w/ 5/10 pain he attributes to weather. FOTO score continues to slowly improve as above. Pt reports good adherence to HEP and feels IR stretching has been beneficial. Today worked on [employer] super set with IR stretching to work on functional positioning with more appropriate ROM as pt continues to demo tendency for abducted/ER position B hips. Continued to progress core/WB activity as appropriate, cues as above. No increase in pain, no  adverse events. Pt departs today's session in no acute distress, all voiced questions/concerns addressed appropriately from PT perspective.        OBJECTIVE IMPAIRMENTS: Abnormal gait, decreased activity tolerance, decreased balance, decreased endurance, decreased mobility, difficulty walking, decreased ROM, decreased strength, hypomobility, improper body mechanics, postural dysfunction, and pain.    ACTIVITY LIMITATIONS: carrying, lifting, bending, sitting, standing, squatting, sleeping, stairs, transfers, bed mobility, bathing, toileting, dressing, and locomotion level   PARTICIPATION LIMITATIONS: meal prep, cleaning, laundry, shopping, community activity, and yard work   PERSONAL FACTORS: Time since onset of injury/illness/exacerbation and 3+ comorbidities: CAD, PE, DVT, HTN  are also affecting patient's functional outcome.    REHAB POTENTIAL: Good   CLINICAL DECISION MAKING: Stable/uncomplicated   EVALUATION COMPLEXITY: Low     GOALS: Goals reviewed with patient? Yes   SHORT TERM GOALS: Target date: 02/25/2022   Pt will demonstrate appropriate understanding and performance of initially prescribed HEP in order to facilitate improved independence with management of symptoms.  Baseline: HEP provided on eval 03/04/22: Pt reports independence w/ HEP Goal status: MET   2. Pt will score greater than or equal to 42% on FOTO in order to demonstrate improved perception of function due to symptoms.            Baseline: 32% 03/04/22: 32% 03/31/22: 35% 04/29/22: 37%  Goal status: ONGOING   LONG TERM GOALS: Target date: 05/29/22 (updated 04/29/22)              Pt will score 51% on FOTO in order to demonstrate improved perception of functional status due to symptoms.  Baseline: 32% 03/31/22: 35% 04/29/22: 37%  Goal status: ONGOING     2.  Pt will demonstrate ability to ambulate at least 1070f with LRAD and no more than 2pt increase in baseline pain on NPS in order to facilitate  improved tolerance to community ambulation.  Baseline: pt unable to tolerate greater than household distances due to significant inc in pain 03/31/22: able to complete 2 minute walk test at 295 feet with increased pain from 5/10 to 7/10.  04/29/22: 4375fw/ rollator no increase in pain 2MWT, significant fatigue; [id number] AD limited by fatigue    Goal status: PROGRESSING   3.  Pt will demonstrate hip flexion MMT of 5/5 in order to facilitate improved clearance with gait for reduced fall risk.  Baseline: 4/5 B w pain 03/31/21: see chart 04/29/22: see chart Goal status: MET   4. Pt will be able to perform TUG in less than or equal to 13 sec with LRAD in order to indicate reduced risk of falling (cutoff score for fall risk 13.5 sec in community dwelling older adults [facility], 2000)            Baseline: 22sec with rollator  02/16/22: 12sec with rollator             Goal status: MET   5. Pt will score >47/56 on BERG to indicate reduced fall risk and increased safety w/ mobility.   04/23/22: 43/56  Goal status: NEW (04/29/22)   PLAN (updated 04/29/22):   PT FREQUENCY: 2x/week   PT DURATION: 4 weeks   PLANNED INTERVENTIONS: Therapeutic exercises, Therapeutic activity, Neuromuscular re-education, Balance training, Gait training, Patient/Family education, Self Care, Stair training, DME instructions, Aquatic Therapy, Cryotherapy, Moist heat, Taping, Manual therapy, and Re-evaluation.   PLAN FOR NEXT SESSION:   Review/update HEP (hip mobility). Gait/functional mobility  DavLeeroy Cha, DPT 05/27/2022 10:16 AM

## 2022-05-27 ENCOUNTER — Encounter: Payer: Self-pay | Admitting: Physical Therapy

## 2022-05-27 ENCOUNTER — Ambulatory Visit: Payer: BLUE CROSS/BLUE SHIELD | Admitting: Physical Therapy

## 2022-05-27 DIAGNOSIS — M5459 Other low back pain: Secondary | ICD-10-CM | POA: Diagnosis not present

## 2022-05-27 DIAGNOSIS — R2689 Other abnormalities of gait and mobility: Secondary | ICD-10-CM

## 2022-05-27 DIAGNOSIS — M6281 Muscle weakness (generalized): Secondary | ICD-10-CM

## 2022-06-01 NOTE — Therapy (Signed)
OUTPATIENT PHYSICAL THERAPY TREATMENT NOTE    [name] MRN: [id number] DOB:01-29-1966, 57 y.o., male Today's Date: 06/02/2022     PCP: Holley Bouche, MD   REFERRING PROVIDER: Judith Part, MD  END OF SESSION:   PT End of Session - 06/02/22 0846     Visit Number 15    Number of Visits 25    Date for PT Re-Evaluation 06/24/22    Authorization Type [place] [employer]    Authorization Time Period auth after 25th visit, FOTO v10    Authorization - Visit Number 15    Authorization - Number of Visits 25    Progress Note Due on Visit 20    PT Start Time 0846    PT Stop Time 0925    PT Time Calculation (min) 39 min    Activity Tolerance Patient tolerated treatment well;No increased pain    Behavior During Therapy WFL for tasks assessed/performed                Past Medical History:  Diagnosis Date   Acute pulmonary embolus (HCC)    bilateral submassive PE in setting of missing several doses of Xarelto for his DVT   Arthritis    DVT (deep venous thrombosis) ([place]) 11/28/2013   RT LEG   High cholesterol    Hypertension    Lumbar spine scoliosis    Marijuana use    Sleep apnea    Past Surgical History:  Procedure Laterality Date   ABDOMINAL EXPOSURE N/A 01/14/2022   Procedure: ABDOMINAL EXPOSURE;  Surgeon: Marty Heck, MD;  Location: [place];  Service: Vascular;  Laterality: N/A;   ANTERIOR LUMBAR FUSION Left 01/14/2022   Procedure: Lumbar three-four Lumbar four-five Oblique Lumbar Interbody Fusion;  Surgeon: Judith Part, MD;  Location: [place];  Service: Neurosurgery;  Laterality: Left;   HERNIA REPAIR     [id number] and [id number]; umbilical hernia repair   KNEE SURGERY Bilateral    Left knee 1993, right knee 2004   TONSILLECTOMY     TOTAL KNEE ARTHROPLASTY Left 02/10/2017   Procedure: LEFT TOTAL KNEE ARTHROPLASTY;  Surgeon: Leandrew Koyanagi, MD;  Location: [place];  Service: Orthopedics;  Laterality: Left;   TOTAL KNEE ARTHROPLASTY Right  07/28/2017   Procedure: RIGHT TOTAL KNEE ARTHROPLASTY;  Surgeon: Leandrew Koyanagi, MD;  Location: [place];  Service: Orthopedics;  Laterality: Right;   TRANSFORAMINAL LUMBAR INTERBODY FUSION W/ MIS 1 LEVEL N/A 01/14/2022   Procedure: OPEN TRANSFORAMINAL LUMBAR INTERBODY FUSION  LUMBAR FIVE-SACRAL ONE ,LAMINECTOMIES AND POSTERIOR LATERAL INSTRUMENTATION FUSION  LUMBAR THREE TO FIVE SACRAL ONE;  Surgeon: Judith Part, MD;  Location: [place];  Service: Neurosurgery;  Laterality: N/A;   Patient Active Problem List   Diagnosis Date Noted   Leg swelling 02/23/2022   Scoliosis 01/14/2022   Snoring 09/22/2021   Insomnia due to mental condition 09/22/2021   At risk for mood disturbance 09/01/2021   Sleep disturbance 07/28/2021   Chronic bilateral low back pain without sciatica 07/25/2019   Neurogenic claudication 05/19/2019   Hyperlipidemia LDL goal <70 07/20/2018   History of pulmonary embolus (PE) 07/20/2017   Mild CAD 07/20/2017   S/p total knee replacement, bilateral 02/10/2017   Chronic anticoagulation 01/26/2017   Pulmonary embolism ([place]) 06/03/2016   Encounter for chronic pain management 04/06/2014   Tobacco abuse 12/05/2013   DVT (deep venous thrombosis) ([place]) 11/28/2013   HTN (hypertension) 05/04/2013   Lumbar back pain 05/04/2013   BMI 33.0-33.9,adult 05/04/2013  Hx of substance abuse ([place]) 05/04/2013    REFERRING DIAG:  M54.50 (ICD-10-CM) - Lumbar back pain  THERAPY DIAG:  Other low back pain  Muscle weakness (generalized)  Other abnormalities of gait and mobility  Rationale for Evaluation and Treatment Rehabilitation  PERTINENT HISTORY:  CAD, PE, DVT, HTN S/p lumbar interbody fusion 01/14/22  PRECAUTIONS: Back, abdominal and posterior incisions Per pt on 03/18/22, spine precautions lifted by surgeon at most recent visit     SUBJECTIVE:                                                                                                                                                                                       SUBJECTIVE STATEMENT:  Pt states he notes improved tolerance to activities around the house (vacuuming, cleaning, etc). Mostly off the rollator, using it in the kitchen some. Sees surgeon on the 13th. Still wearing brace most of the time, feels it helps with posture     PAIN:  Are you having pain:  3/10 Location: low back How would you describe your pain? Stiff, achey Best in past week: 3/10 with movement, no resting pain at best Worst in past week: 6/10 Aggravating factors: walking/standing household distances, prolonged sitting Easing factors: rest breaks, ice    OBJECTIVE: (objective measures completed at initial evaluation unless otherwise dated)    DIAGNOSTIC FINDINGS:  Post op interbody fusion lumbar   PATIENT SURVEYS:  FOTO 32% FOTO 03/04/22 32% FOTO 03/31/22 35% 04/29/22: 37%  05/27/22: 40%   COGNITION: Overall cognitive status: Within functional limits for tasks assessed                          SENSATION: Light touch intact B LEs     POSTURE: increased kyphosis and forward head posture, UT elevation   PALPATION/OBSERVATION: Deferred as pt wears back brace to eval - states he saw MD this morning and no concerns; abdominal incision healed and posterior incision with mild drainage but WNL per pt report   LUMBAR ROM:    AROM 03/18/22  Flexion  75% distal to knee, mild pain  Extension  25% p!  Right lateral flexion    Left lateral flexion    Right rotation 50% stiffness  Left rotation  50% stiffness   (Blank rows = not tested) Comments: Deferred on eval given spine precautions 04/29/22: Deferred given time constraints and pt arriving with back brace donned     LOWER EXTREMITY MMT:     MMT Right eval Left eval Right 03/31/22 Left 03/31/22 R/L 04/29/22  Hip flexion 4p! 4p! 4+ P 4+ 5/5 (stiffness R side)  Hip abduction (modified sitting) 4+ 4+ 4+ 4+ 4+/4+  Hip internal rotation         Hip external rotation      4- 4 4+/4+  Knee flexion 5 4+ 5 5   Knee extension 4+ 4+ 5 5    (Blank rows = not tested)   Comments: mild pulling in hamstring with knee ext on R, no pain with resistance; mild pain with hip flex B that resolves with cessation of movement       FUNCTIONAL TESTS:  Sit to stand transfer, standard chair: heavy use of UE on rollator and chair, reduced fwd weight shift, wide BOS, significantly inc time and effort   TUG: 22 seconds from standard chair w/ rollator, gait mechanics as below, heavy UE use  02/16/22: TUG 12sec standard chair with rollator, continues with impaired gait mechanics and heavy UE use, fwd flexed posture  2 minute walk test: 03/31/22: 295 Feet  2MWT 04/29/22: 438f w/ rollator  3843fgait no AD, RPE 5/10, limited by fatigue, SBA 05/11/22: Tandem stance 30 sec      BERG BALANCE: 43/56 (04/23/22)  Sitting to Standing: Numbers; 0-4: 3  4. Stands without using hands and stabilize independently  3. Stands independently using hands  2. Stands using hands after multiple trials  1. Min A to stand  0. Mod-Max A to stand Standing unsupported: Numbers; 0-4: 4  4. Stands safely for 2 minutes  3. Stands 2 minutes with supervision  2. Stands 30 seconds unsupported  1. Needs several tries to stand unsupported for 30 seconds  0. Unable to stand unsupported for 30 seconds Sitting unsupported: Numbers; 0-4: 4  4. Sits for 2 minutes independently  3. Sits for 2 minutes with supervision  2. Able to sit 30 seconds  1. Able to sit 10 seconds  0. Unable to sit for 10 seconds Standing to Sitting: Numbers; 0-4: 3 4. Sits safely with minimal use of hands 3. Controls descent with hands 2. Uses back of legs against chair to control descent 1. Sits independently, but uncontrolled descent 0. Needs assistance Transfers: Numbers; 0-4: 3  4. Transfers safely with minor use of hands  3. Transfers safely definite use of hands  2. Transfers with verbal cueing/supervision  1. Needs 1 person  assist  0. Needs 2 person assist  Standing with eyes closed: Numbers; 0-4: 4  4. Stands safely for 10 seconds  3. Stands 10 seconds with supervision   2. Able to stand for 3 seconds  1. Unable to keep eyes closed for 3 seconds, but is safe  0. Needs assist to keep from falling Standing with feet together: Numbers; 0-4: 4 4. Stands for 1 minute safely 3. Stands for 1 minute with supervision 2. Unable to hold for 30 seconds  1. Needs help to attain position but can hold for 15 seconds  0. Needs help to attain position and unable to hold for 15 seconds Reaching forward with outstretched arm: Numbers; 0-4: 3  4. Reaches forward 10 inches  3. Reaches forward 5 inches  2. Reaches forward 2 inches  1. Reaches forward with supervision  0. Loses balance/requires assistace Retrieving object from the floor: Numbers; 0-4: 3 4. Able to pick up easily and safely 3. Able to pick up with supervision 2. Unable to pick up, but reaches within 1-2 inches independently 1. Unable to pick up and needs supervision 0. Unable/needs assistance to keep from falling  Turning to look behind: Numbers; 0-4: 2  4. Looks behind from both sides and weight shifts well  3. Looks behind one side only, other side less weight shift  2. Turns sideways only, maintains balance  1. Needs supervision when turning  0. Needs assistance  Turning 360 degrees: Numbers; 0-4: 2  4. Able to turn in </=4 seconds  3. Able to turn on one side in </= 4 seconds   2. Able to turn slowly, but safely  1. Needs supervision or verbal cueing  0. Needs assistance Place alternate foot on stool: Numbers; 0-4: 4 4. Completes 8 steps in 20 seconds 3. Completes 8 steps in >20 seconds 2. 4 steps without assistance/supervision 1. Completes >2 steps with minimal assist 0. Unable, needs assist to keep from falling Standing with one foot in front: Numbers; 0-4: 1  4. Independent tandem for 30 seconds  3. Independent foot ahead for 30  seconds  2. Independent small step for 30 seconds  1. Needs help to step, but can hold for 15 seconds  0. Loses balance while standing/stepping Standing on one foot: Numbers; 0-4: 3 4. Holds >10 seconds 3. Holds 5-10 seconds 2. Holds >/=3 seconds  1. Holds <3 seconds 0. Unable   Total Score: 43/56     GAIT: Distance walked: within clinic Assistive device utilized: rollator Level of assistance: Modified independence Comments: significant forward flexed posture, partial step through pattern with intermittent step to, reduced hip extension B, reduced knee ROM throughout swing/stance    TODAY'S TREATMENT:  [facility] Adult PT Treatment:                                                DATE: 06/02/22 Therapeutic Exercise: Nu step L6 6 min UE/LE during subjective STS x5 superset with hip IR AAROM x5 B LE seated, 3 bouts cues for trunk lean with STS and foot positioning  Adductor iso 2x10 cues for maintaining tension Standing TKE ball at wall, 2x10 each LE cues for reduced compensations at hip, posture Heel raises at counter 3x15 cues for foot positioning  HEP handout + review, discussed alignment w/ exercise   [facility] Adult PT Treatment:                                                DATE: 05/27/22 Therapeutic Exercise: Nu step L7 6 min UE/LE during subjective Hip IR AAROM seated 2x10 each LE cues for form and comfortable  Cable column paloff press x10 7# cues for posture  Cable column unilat row 10# x10 B UE cues for setup Cable column unilat chest press 10# x10 each UE cues for form, trunk positioning, and appropriate core activation  Therapeutic Activity: FOTO + education STS superset with hip IR AAROM (see above) 3x5 from mat + airex, requires significant rest breaks due to increased fatigue with neutral positioning Staggered stance weight shift (partial lunge) for improved mechanics, reduced ER at hips x8 each LE    [facility] Adult PT Treatment:  DATE: 05/25/22 Therapeutic Exercise: Nu step LE/UE 52mn during subjective Blue band row x20 Blue band shoulder ext x20 cues for reduced compensation at triceps Blue band paloff + OH press x10 each cues for form and posture Blue band chest press unilat x8 each UE cues for form and posture Triple flex/ext unilat UE support on counter x10 each cues for posture and reduced compensations Seated hip IR with RTB and block for fulcrum 2x10 cues for full ROM and reduced compensation Seated hip IR AAROM w/ UE assist x10 each LE cues for positioning and proper movement    PATIENT EDUCATION:  Education details: rationale for interventions Person educated: Patient Education method: Explanation, Demonstration, Tactile cues, Verbal cues Education comprehension: verbalized understanding, returned demonstration, verbal cues required, tactile cues required, and needs further education    HOME EXERCISE PROGRAM: Access Code: [email or web address] URL: [email or web address] Date: 06/02/2022 Prepared by: DEnis Slipper Exercises - Sit to Stand with Armchair  - 1 x daily - 7 x weekly - 2 sets - 10 reps - Heel Raises with Counter Support  - 1 x daily - 7 x weekly - 2 sets - 10 reps - Standing Row with Anchored Resistance  - 1 x daily - 7 x weekly - 2-3 sets - 10 reps - 5 hold - Shoulder extension with resistance - Neutral  - 1 x daily - 7 x weekly - 2-3 sets - 10 reps - 5 hold - Standing Anti-Rotation Press with Anchored Resistance  - 1 x daily - 7 x weekly - 1-2 sets - 10 reps - 3-5 hold - Seated Hip Adduction Isometrics with Ball  - 1 x daily - 7 x weekly - 3 sets - 10 reps   ASSESSMENT:   CLINICAL IMPRESSION: Pt arrives w/ 3/10 pain, continues to endorse slow/steady progress functionally. Today focusing on hip mobility/stability and maintaining appropriate LE alignment with strengthening activities as pt demos tendency for abducted/externally rotated position. Pt tolerates well without any increase  in pain or adverse event, cues as above. HEP updated as above. Pt departs today's session in no acute distress, all voiced questions/concerns addressed appropriately from PT perspective.       OBJECTIVE IMPAIRMENTS: Abnormal gait, decreased activity tolerance, decreased balance, decreased endurance, decreased mobility, difficulty walking, decreased ROM, decreased strength, hypomobility, improper body mechanics, postural dysfunction, and pain.    ACTIVITY LIMITATIONS: carrying, lifting, bending, sitting, standing, squatting, sleeping, stairs, transfers, bed mobility, bathing, toileting, dressing, and locomotion level   PARTICIPATION LIMITATIONS: meal prep, cleaning, laundry, shopping, community activity, and yard work   PERSONAL FACTORS: Time since onset of injury/illness/exacerbation and 3+ comorbidities: CAD, PE, DVT, HTN  are also affecting patient's functional outcome.    REHAB POTENTIAL: Good   CLINICAL DECISION MAKING: Stable/uncomplicated   EVALUATION COMPLEXITY: Low     GOALS: Goals reviewed with patient? Yes   SHORT TERM GOALS: Target date: 02/25/2022   Pt will demonstrate appropriate understanding and performance of initially prescribed HEP in order to facilitate improved independence with management of symptoms.  Baseline: HEP provided on eval 03/04/22: Pt reports independence w/ HEP Goal status: MET   2. Pt will score greater than or equal to 42% on FOTO in order to demonstrate improved perception of function due to symptoms.            Baseline: 32% 03/04/22: 32% 03/31/22: 35% 04/29/22: 37%             Goal status: ONGOING   LONG TERM GOALS: Target date: 05/29/22 (  updated 04/29/22)              Pt will score 51% on FOTO in order to demonstrate improved perception of functional status due to symptoms.  Baseline: 32% 03/31/22: 35% 04/29/22: 37%  Goal status: ONGOING     2.  Pt will demonstrate ability to ambulate at least 1077f with LRAD and no more than 2pt increase in  baseline pain on NPS in order to facilitate improved tolerance to community ambulation.  Baseline: pt unable to tolerate greater than household distances due to significant inc in pain 03/31/22: able to complete 2 minute walk test at 295 feet with increased pain from 5/10 to 7/10.  04/29/22: 4330fw/ rollator no increase in pain 2MWT, significant fatigue; [id number] AD limited by fatigue    Goal status: PROGRESSING   3.  Pt will demonstrate hip flexion MMT of 5/5 in order to facilitate improved clearance with gait for reduced fall risk.  Baseline: 4/5 B w pain 03/31/21: see chart 04/29/22: see chart Goal status: MET   4. Pt will be able to perform TUG in less than or equal to 13 sec with LRAD in order to indicate reduced risk of falling (cutoff score for fall risk 13.5 sec in community dwelling older adults [facility])            Baseline: 22sec with rollator  02/16/22: 12sec with rollator             Goal status: MET   5. Pt will score >47/56 on BERG to indicate reduced fall risk and increased safety w/ mobility.   04/23/22: 43/56  Goal status: NEW (04/29/22)   PLAN (updated 04/29/22):   PT FREQUENCY: 2x/week   PT DURATION: 4 weeks   PLANNED INTERVENTIONS: Therapeutic exercises, Therapeutic activity, Neuromuscular re-education, Balance training, Gait training, Patient/Family education, Self Care, Stair training, DME instructions, Aquatic Therapy, Cryotherapy, Moist heat, Taping, Manual therapy, and Re-evaluation.   PLAN FOR NEXT SESSION:   Review/update HEP (hip mobility). Gait/functional mobility  DavLeeroy Cha, DPT 06/02/2022 9:30 AM

## 2022-06-02 ENCOUNTER — Ambulatory Visit: Payer: Commercial Managed Care - PPO | Attending: Cardiology | Admitting: Physical Therapy

## 2022-06-02 ENCOUNTER — Encounter: Payer: Self-pay | Admitting: Physical Therapy

## 2022-06-02 DIAGNOSIS — M6281 Muscle weakness (generalized): Secondary | ICD-10-CM | POA: Diagnosis not present

## 2022-06-02 DIAGNOSIS — M5459 Other low back pain: Secondary | ICD-10-CM | POA: Insufficient documentation

## 2022-06-02 DIAGNOSIS — R2689 Other abnormalities of gait and mobility: Secondary | ICD-10-CM | POA: Insufficient documentation

## 2022-06-02 NOTE — Therapy (Signed)
OUTPATIENT PHYSICAL THERAPY TREATMENT NOTE    [name] MRN: [id number] DOB:05-19-1965, 57 y.o., male Today's Date: 06/03/2022     PCP: Bess Kinds, MD   REFERRING PROVIDER: Jadene Pierini, MD  END OF SESSION:   PT End of Session - 06/03/22 0845     Visit Number 16    Number of Visits 25    Date for PT Re-Evaluation 06/24/22    Authorization Type [place] [employer]    Authorization Time Period auth after 25th visit    Authorization - Visit Number 16    Authorization - Number of Visits 25    PT Start Time 0848    PT Stop Time 0927    PT Time Calculation (min) 39 min    Activity Tolerance Patient tolerated treatment well;No increased pain    Behavior During Therapy WFL for tasks assessed/performed                 Past Medical History:  Diagnosis Date   Acute pulmonary embolus (HCC)    bilateral submassive PE in setting of missing several doses of Xarelto for his DVT   Arthritis    DVT (deep venous thrombosis) (HCC) 11/28/2013   RT LEG   High cholesterol    Hypertension    Lumbar spine scoliosis    Marijuana use    Sleep apnea    Past Surgical History:  Procedure Laterality Date   ABDOMINAL EXPOSURE N/A 01/14/2022   Procedure: ABDOMINAL EXPOSURE;  Surgeon: Cephus Shelling, MD;  Location: [facility] OR;  Service: Vascular;  Laterality: N/A;   ANTERIOR LUMBAR FUSION Left 01/14/2022   Procedure: Lumbar three-four Lumbar four-five Oblique Lumbar Interbody Fusion;  Surgeon: Jadene Pierini, MD;  Location: [facility] OR;  Service: Neurosurgery;  Laterality: Left;   HERNIA REPAIR     2005 and 2011; umbilical hernia repair   KNEE SURGERY Bilateral    Left knee 1993, right knee 2004   TONSILLECTOMY     TOTAL KNEE ARTHROPLASTY Left 02/10/2017   Procedure: LEFT TOTAL KNEE ARTHROPLASTY;  Surgeon: Tarry Kos, MD;  Location: [facility] OR;  Service: Orthopedics;  Laterality: Left;   TOTAL KNEE ARTHROPLASTY Right 07/28/2017   Procedure: RIGHT TOTAL KNEE  ARTHROPLASTY;  Surgeon: Tarry Kos, MD;  Location: [facility] OR;  Service: Orthopedics;  Laterality: Right;   TRANSFORAMINAL LUMBAR INTERBODY FUSION W/ MIS 1 LEVEL N/A 01/14/2022   Procedure: OPEN TRANSFORAMINAL LUMBAR INTERBODY FUSION  LUMBAR FIVE-SACRAL ONE ,LAMINECTOMIES AND POSTERIOR LATERAL INSTRUMENTATION FUSION  LUMBAR THREE TO FIVE SACRAL ONE;  Surgeon: Jadene Pierini, MD;  Location: [facility] OR;  Service: Neurosurgery;  Laterality: N/A;   Patient Active Problem List   Diagnosis Date Noted   Leg swelling 02/23/2022   Scoliosis 01/14/2022   Snoring 09/22/2021   Insomnia due to mental condition 09/22/2021   At risk for mood disturbance 09/01/2021   Sleep disturbance 07/28/2021   Chronic bilateral low back pain without sciatica 07/25/2019   Neurogenic claudication 05/19/2019   Hyperlipidemia LDL goal <70 07/20/2018   History of pulmonary embolus (PE) 07/20/2017   Mild CAD 07/20/2017   S/p total knee replacement, bilateral 02/10/2017   Chronic anticoagulation 01/26/2017   Pulmonary embolism (HCC) 06/03/2016   Encounter for chronic pain management 04/06/2014   Tobacco abuse 12/05/2013   DVT (deep venous thrombosis) (HCC) 11/28/2013   HTN (hypertension) 05/04/2013   Lumbar back pain 05/04/2013   BMI 33.0-33.9,adult 05/04/2013   Hx of substance abuse (HCC) 05/04/2013  REFERRING DIAG:  M54.50 (ICD-10-CM) - Lumbar back pain  THERAPY DIAG:  Other low back pain  Muscle weakness (generalized)  Other abnormalities of gait and mobility  Rationale for Evaluation and Treatment Rehabilitation  PERTINENT HISTORY:  CAD, PE, DVT, HTN S/p lumbar interbody fusion 01/14/22  PRECAUTIONS: Back, abdominal and posterior incisions Per pt on 03/18/22, spine precautions lifted by surgeon at most recent visit     SUBJECTIVE:                                                                                                                                                                                       SUBJECTIVE STATEMENT:  Pt arrives w/ increased pain/stiffness he attributes to weather, felt good after last session. No other new updates    PAIN:  Are you having pain:  6/10 Location: low back How would you describe your pain? Stiff, achey Best in past week: 3/10 with movement, no resting pain at best Worst in past week: 6/10 Aggravating factors: walking/standing household distances, prolonged sitting Easing factors: rest breaks, ice    OBJECTIVE: (objective measures completed at initial evaluation unless otherwise dated)    DIAGNOSTIC FINDINGS:  Post op interbody fusion lumbar   PATIENT SURVEYS:  FOTO 32% FOTO 03/04/22 32% FOTO 03/31/22 35% 04/29/22: 37%  05/27/22: 40%   COGNITION: Overall cognitive status: Within functional limits for tasks assessed                          SENSATION: Light touch intact B LEs     POSTURE: increased kyphosis and forward head posture, UT elevation   PALPATION/OBSERVATION: Deferred as pt wears back brace to eval - states he saw MD this morning and no concerns; abdominal incision healed and posterior incision with mild drainage but WNL per pt report   LUMBAR ROM:    AROM 03/18/22  Flexion  75% distal to knee, mild pain  Extension  25% p!  Right lateral flexion    Left lateral flexion    Right rotation 50% stiffness  Left rotation  50% stiffness   (Blank rows = not tested) Comments: Deferred on eval given spine precautions 04/29/22: Deferred given time constraints and pt arriving with back brace donned     LOWER EXTREMITY MMT:     MMT Right eval Left eval Right 03/31/22 Left 03/31/22 R/L 04/29/22  Hip flexion 4p! 4p! 4+ P 4+ 5/5 (stiffness R side)  Hip abduction (modified sitting) 4+ 4+ 4+ 4+ 4+/4+  Hip internal rotation         Hip external rotation     4- 4 4+/4+  Knee  flexion 5 4+ 5 5   Knee extension 4+ 4+ 5 5    (Blank rows = not tested)   Comments: mild pulling in hamstring with knee ext on R, no pain with  resistance; mild pain with hip flex B that resolves with cessation of movement       FUNCTIONAL TESTS:  Sit to stand transfer, standard chair: heavy use of UE on rollator and chair, reduced fwd weight shift, wide BOS, significantly inc time and effort   TUG: 22 seconds from standard chair w/ rollator, gait mechanics as below, heavy UE use  02/16/22: TUG 12sec standard chair with rollator, continues with impaired gait mechanics and heavy UE use, fwd flexed posture  2 minute walk test: 03/31/22: 295 Feet  04/29/22: 472ft w/ rollator  337ft gait no AD, RPE 5/10, limited by fatigue, SBA 05/11/22: Tandem stance 30 sec      BERG BALANCE: 43/56 (04/23/22)  Sitting to Standing: Numbers; 0-4: 3  4. Stands without using hands and stabilize independently  3. Stands independently using hands  2. Stands using hands after multiple trials  1. Min A to stand  0. Mod-Max A to stand Standing unsupported: Numbers; 0-4: 4  4. Stands safely for 2 minutes  3. Stands 2 minutes with supervision  2. Stands 30 seconds unsupported  1. Needs several tries to stand unsupported for 30 seconds  0. Unable to stand unsupported for 30 seconds Sitting unsupported: Numbers; 0-4: 4  4. Sits for 2 minutes independently  3. Sits for 2 minutes with supervision  2. Able to sit 30 seconds  1. Able to sit 10 seconds  0. Unable to sit for 10 seconds Standing to Sitting: Numbers; 0-4: 3 4. Sits safely with minimal use of hands 3. Controls descent with hands 2. Uses back of legs against chair to control descent 1. Sits independently, but uncontrolled descent 0. Needs assistance Transfers: Numbers; 0-4: 3  4. Transfers safely with minor use of hands  3. Transfers safely definite use of hands  2. Transfers with verbal cueing/supervision  1. Needs 1 person assist  0. Needs 2 person assist  Standing with eyes closed: Numbers; 0-4: 4  4. Stands safely for 10 seconds  3. Stands 10 seconds with supervision   2. Able to  stand for 3 seconds  1. Unable to keep eyes closed for 3 seconds, but is safe  0. Needs assist to keep from falling Standing with feet together: Numbers; 0-4: 4 4. Stands for 1 minute safely 3. Stands for 1 minute with supervision 2. Unable to hold for 30 seconds  1. Needs help to attain position but can hold for 15 seconds  0. Needs help to attain position and unable to hold for 15 seconds Reaching forward with outstretched arm: Numbers; 0-4: 3  4. Reaches forward 10 inches  3. Reaches forward 5 inches  2. Reaches forward 2 inches  1. Reaches forward with supervision  0. Loses balance/requires assistace Retrieving object from the floor: Numbers; 0-4: 3 4. Able to pick up easily and safely 3. Able to pick up with supervision 2. Unable to pick up, but reaches within 1-2 inches independently 1. Unable to pick up and needs supervision 0. Unable/needs assistance to keep from falling  Turning to look behind: Numbers; 0-4: 2  4. Looks behind from both sides and weight shifts well  3. Looks behind one side only, other side less weight shift  2. Turns sideways only, maintains balance  1. Needs  supervision when turning  0. Needs assistance  Turning 360 degrees: Numbers; 0-4: 2  4. Able to turn in </=4 seconds  3. Able to turn on one side in </= 4 seconds   2. Able to turn slowly, but safely  1. Needs supervision or verbal cueing  0. Needs assistance Place alternate foot on stool: Numbers; 0-4: 4 4. Completes 8 steps in 20 seconds 3. Completes 8 steps in >20 seconds 2. 4 steps without assistance/supervision 1. Completes >2 steps with minimal assist 0. Unable, needs assist to keep from falling Standing with one foot in front: Numbers; 0-4: 1  4. Independent tandem for 30 seconds  3. Independent foot ahead for 30 seconds  2. Independent small step for 30 seconds  1. Needs help to step, but can hold for 15 seconds  0. Loses balance while standing/stepping Standing on one foot:  Numbers; 0-4: 3 4. Holds >10 seconds 3. Holds 5-10 seconds 2. Holds >/=3 seconds  1. Holds <3 seconds 0. Unable   Total Score: 43/56     GAIT: Distance walked: within clinic Assistive device utilized: rollator Level of assistance: Modified independence Comments: significant forward flexed posture, partial step through pattern with intermittent step to, reduced hip extension B, reduced knee ROM throughout swing/stance    TODAY'S TREATMENT:  [facility] Adult PT Treatment:                                                DATE: 06/03/22 Therapeutic Exercise: Nu step L5 LE/UE during subjective  Cable column paloff press 7# x10, 10# x10 B cues for posture  Cable column unilat row 10# x10 each UE, 13# x10 each UE Farmer carry 10# B 4x43ft cues for posture and pacing RTB around wrist, B scaption w/ emphasis on thoracic extension, 2x12 cues for posture and pacing DB chest press, 5# seated for improved postural endurance 2x10 cues for form, sitting position   [facility] Adult PT Treatment:                                                DATE: 06/02/22 Therapeutic Exercise: Nu step L6 6 min UE/LE during subjective STS x5 superset with hip IR AAROM x5 B LE seated, 3 bouts cues for trunk lean with STS and foot positioning  Adductor iso 2x10 cues for maintaining tension Standing TKE ball at wall, 2x10 each LE cues for reduced compensations at hip, posture Heel raises at counter 3x15 cues for foot positioning  HEP handout + review, discussed alignment w/ exercise   [facility] Adult PT Treatment:                                                DATE: 05/27/22 Therapeutic Exercise: Nu step L7 6 min UE/LE during subjective Hip IR AAROM seated 2x10 each LE cues for form and comfortable  Cable column paloff press x10 7# cues for posture  Cable column unilat row 10# x10 B UE cues for setup Cable column unilat chest press 10# x10 each UE cues for form, trunk positioning, and appropriate core  activation  Therapeutic Activity: FOTO + education STS superset with hip IR AAROM (see above) 3x5 from mat + airex, requires significant rest breaks due to increased fatigue with neutral positioning Staggered stance weight shift (partial lunge) for improved mechanics, reduced ER at hips x8 each LE     PATIENT EDUCATION:  Education details: rationale for interventions Person educated: Patient Education method: Explanation, Demonstration, Tactile cues, Verbal cues Education comprehension: verbalized understanding, returned demonstration, verbal cues required, tactile cues required, and needs further education    HOME EXERCISE PROGRAM: Access Code: [email or web address] URL: [email or web address] Date: 06/02/2022 Prepared by: Fransisco Hertz  Exercises - Sit to Stand with Armchair  - 1 x daily - 7 x weekly - 2 sets - 10 reps - Heel Raises with Counter Support  - 1 x daily - 7 x weekly - 2 sets - 10 reps - Standing Row with Anchored Resistance  - 1 x daily - 7 x weekly - 2-3 sets - 10 reps - 5 hold - Shoulder extension with resistance - Neutral  - 1 x daily - 7 x weekly - 2-3 sets - 10 reps - 5 hold - Standing Anti-Rotation Press with Anchored Resistance  - 1 x daily - 7 x weekly - 1-2 sets - 10 reps - 3-5 hold - Seated Hip Adduction Isometrics with Ball  - 1 x daily - 7 x weekly - 3 sets - 10 reps   ASSESSMENT:   CLINICAL IMPRESSION: Pt arrives w/ 6/10 pain he attributes to weather. Given consecutive treatment days, focusing today on postural control/extension and core/periscapular stability. Pt tolerates well but does require increased rest breaks, cues as above. No adverse events, pt departs with report of 5/10 pain on NPS. Recommend continuing along current POC in order to address relevant deficits and improve functional tolerance. Pt departs today's session in no acute distress, all voiced questions/concerns addressed appropriately from PT perspective.       OBJECTIVE IMPAIRMENTS:  Abnormal gait, decreased activity tolerance, decreased balance, decreased endurance, decreased mobility, difficulty walking, decreased ROM, decreased strength, hypomobility, improper body mechanics, postural dysfunction, and pain.    ACTIVITY LIMITATIONS: carrying, lifting, bending, sitting, standing, squatting, sleeping, stairs, transfers, bed mobility, bathing, toileting, dressing, and locomotion level   PARTICIPATION LIMITATIONS: meal prep, cleaning, laundry, shopping, community activity, and yard work   PERSONAL FACTORS: Time since onset of injury/illness/exacerbation and 3+ comorbidities: CAD, PE, DVT, HTN  are also affecting patient's functional outcome.    REHAB POTENTIAL: Good   CLINICAL DECISION MAKING: Stable/uncomplicated   EVALUATION COMPLEXITY: Low     GOALS: Goals reviewed with patient? Yes   SHORT TERM GOALS: Target date: 02/25/2022   Pt will demonstrate appropriate understanding and performance of initially prescribed HEP in order to facilitate improved independence with management of symptoms.  Baseline: HEP provided on eval 03/04/22: Pt reports independence w/ HEP Goal status: MET   2. Pt will score greater than or equal to 42% on FOTO in order to demonstrate improved perception of function due to symptoms.            Baseline: 32% 03/04/22: 32% 03/31/22: 35% 04/29/22: 37%             Goal status: ONGOING   LONG TERM GOALS: Target date: 05/29/22 (updated 04/29/22)              Pt will score 51% on FOTO in order to demonstrate improved perception of functional status due to symptoms.  Baseline: 32% 03/31/22: 35% 04/29/22: 37%  Goal status:  ONGOING     2.  Pt will demonstrate ability to ambulate at least 1041ft with LRAD and no more than 2pt increase in baseline pain on NPS in order to facilitate improved tolerance to community ambulation.  Baseline: pt unable to tolerate greater than household distances due to significant inc in pain 03/31/22: able to complete 2 minute  walk test at 295 feet with increased pain from 5/10 to 7/10.  04/29/22: 41ft w/ rollator no increase in pain , significant fatigue; 370ft no AD limited by fatigue    Goal status: PROGRESSING   3.  Pt will demonstrate hip flexion MMT of 5/5 in order to facilitate improved clearance with gait for reduced fall risk.  Baseline: 4/5 B w pain 03/31/21: see chart 04/29/22: see chart Goal status: MET   4. Pt will be able to perform TUG in less than or equal to 13 sec with LRAD in order to indicate reduced risk of falling (cutoff score for fall risk 13.5 sec in community dwelling older adults per [facility] et al, 2000)            Baseline: 22sec with rollator  02/16/22: 12sec with rollator             Goal status: MET   5. Pt will score >47/56 on BERG to indicate reduced fall risk and increased safety w/ mobility.   04/23/22: 43/56  Goal status: NEW (04/29/22)   PLAN (updated 04/29/22):   PT FREQUENCY: 2x/week   PT DURATION: 4 weeks   PLANNED INTERVENTIONS: Therapeutic exercises, Therapeutic activity, Neuromuscular re-education, Balance training, Gait training, Patient/Family education, Self Care, Stair training, DME instructions, Aquatic Therapy, Cryotherapy, Moist heat, Taping, Manual therapy, and Re-evaluation.   PLAN FOR NEXT SESSION:   Review/update HEP (hip mobility). Gait/functional mobility  Ashley Murrain PT, DPT 06/03/2022 11:02 AM

## 2022-06-03 ENCOUNTER — Ambulatory Visit: Payer: Commercial Managed Care - PPO | Admitting: Physical Therapy

## 2022-06-03 ENCOUNTER — Encounter: Payer: Self-pay | Admitting: Physical Therapy

## 2022-06-03 DIAGNOSIS — R2689 Other abnormalities of gait and mobility: Secondary | ICD-10-CM | POA: Diagnosis not present

## 2022-06-03 DIAGNOSIS — M6281 Muscle weakness (generalized): Secondary | ICD-10-CM | POA: Diagnosis not present

## 2022-06-03 DIAGNOSIS — M5459 Other low back pain: Secondary | ICD-10-CM | POA: Diagnosis not present

## 2022-06-10 ENCOUNTER — Ambulatory Visit: Payer: Commercial Managed Care - PPO | Admitting: Physical Therapy

## 2022-06-10 ENCOUNTER — Encounter: Payer: Self-pay | Admitting: Physical Therapy

## 2022-06-10 DIAGNOSIS — M5459 Other low back pain: Secondary | ICD-10-CM | POA: Diagnosis not present

## 2022-06-10 DIAGNOSIS — R2689 Other abnormalities of gait and mobility: Secondary | ICD-10-CM

## 2022-06-10 DIAGNOSIS — M6281 Muscle weakness (generalized): Secondary | ICD-10-CM

## 2022-06-10 NOTE — Therapy (Signed)
OUTPATIENT PHYSICAL THERAPY TREATMENT NOTE    [name] MRN: [id number] DOB:Apr 27, 1965, 57 y.o., male Today's Date: 06/10/2022     PCP: Holley Bouche, MD   REFERRING PROVIDER: Judith Part, MD  END OF SESSION:   PT End of Session - 06/10/22 0851     Visit Number 17    Number of Visits 25    Date for PT Re-Evaluation 06/24/22    Authorization Type [place] [employer]    Authorization Time Period auth after 25th visit    PT Start Time 0845    PT Stop Time 0910   request to leave early   PT Time Calculation (min) 25 min                 Past Medical History:  Diagnosis Date   Acute pulmonary embolus ([place])    bilateral submassive PE in setting of missing several doses of Xarelto for his DVT   Arthritis    DVT (deep venous thrombosis) ([place]) 11/28/2013   RT LEG   High cholesterol    Hypertension    Lumbar spine scoliosis    Marijuana use    Sleep apnea    Past Surgical History:  Procedure Laterality Date   ABDOMINAL EXPOSURE N/A 01/14/2022   Procedure: ABDOMINAL EXPOSURE;  Surgeon: Marty Heck, MD;  Location: [facility] OR;  Service: Vascular;  Laterality: N/A;   ANTERIOR LUMBAR FUSION Left 01/14/2022   Procedure: Lumbar three-four Lumbar four-five Oblique Lumbar Interbody Fusion;  Surgeon: Judith Part, MD;  Location: [facility];  Service: Neurosurgery;  Laterality: Left;   HERNIA REPAIR     [id number] and [id number]; umbilical hernia repair   KNEE SURGERY Bilateral    Left knee 1993, right knee 2004   TONSILLECTOMY     TOTAL KNEE ARTHROPLASTY Left 02/10/2017   Procedure: LEFT TOTAL KNEE ARTHROPLASTY;  Surgeon: Leandrew Koyanagi, MD;  Location: [place];  Service: Orthopedics;  Laterality: Left;   TOTAL KNEE ARTHROPLASTY Right 07/28/2017   Procedure: RIGHT TOTAL KNEE ARTHROPLASTY;  Surgeon: Leandrew Koyanagi, MD;  Location: [place];  Service: Orthopedics;  Laterality: Right;   TRANSFORAMINAL LUMBAR INTERBODY FUSION W/ MIS 1 LEVEL N/A 01/14/2022   Procedure: OPEN  TRANSFORAMINAL LUMBAR INTERBODY FUSION  LUMBAR FIVE-SACRAL ONE ,LAMINECTOMIES AND POSTERIOR LATERAL INSTRUMENTATION FUSION  LUMBAR THREE TO FIVE SACRAL ONE;  Surgeon: Judith Part, MD;  Location: [employer] [facility];  Service: Neurosurgery;  Laterality: N/A;   Patient Active Problem List   Diagnosis Date Noted   Leg swelling 02/23/2022   Scoliosis 01/14/2022   Snoring 09/22/2021   Insomnia due to mental condition 09/22/2021   At risk for mood disturbance 09/01/2021   Sleep disturbance 07/28/2021   Chronic bilateral low back pain without sciatica 07/25/2019   Neurogenic claudication 05/19/2019   Hyperlipidemia LDL goal <70 07/20/2018   History of pulmonary embolus (PE) 07/20/2017   Mild CAD 07/20/2017   S/p total knee replacement, bilateral 02/10/2017   Chronic anticoagulation 01/26/2017   Pulmonary embolism ([place]) 06/03/2016   Encounter for chronic pain management 04/06/2014   Tobacco abuse 12/05/2013   DVT (deep venous thrombosis) ([place]) 11/28/2013   HTN (hypertension) 05/04/2013   Lumbar back pain 05/04/2013   BMI 33.0-33.9,adult 05/04/2013   Hx of substance abuse ([place]) 05/04/2013    REFERRING DIAG:  M54.50 (ICD-10-CM) - Lumbar back pain  THERAPY DIAG:  Other low back pain  Muscle weakness (generalized)  Other abnormalities of gait and mobility  Rationale for Evaluation  and Treatment Rehabilitation  PERTINENT HISTORY:  CAD, PE, DVT, HTN S/p lumbar interbody fusion 01/14/22  PRECAUTIONS: Back, abdominal and posterior incisions Per pt on 03/18/22, spine precautions lifted by surgeon at most recent visit     SUBJECTIVE:                                                                                                                                                                                      SUBJECTIVE STATEMENT:  Pt arrives reporting stiffness, not really pain.     PAIN:  Are you having pain:  8/10 Location: low back How would you describe your pain? Stiff,  achey Best in past week: 3/10 with movement, no resting pain at best Worst in past week: 6/10 Aggravating factors: walking/standing household distances, prolonged sitting Easing factors: rest breaks, ice    OBJECTIVE: (objective measures completed at initial evaluation unless otherwise dated)    DIAGNOSTIC FINDINGS:  Post op interbody fusion lumbar   PATIENT SURVEYS:  FOTO 32% FOTO 03/04/22 32% FOTO 03/31/22 35% 04/29/22: 37%  05/27/22: 40%   COGNITION: Overall cognitive status: Within functional limits for tasks assessed                          SENSATION: Light touch intact B LEs     POSTURE: increased kyphosis and forward head posture, UT elevation   PALPATION/OBSERVATION: Deferred as pt wears back brace to eval - states he saw MD this morning and no concerns; abdominal incision healed and posterior incision with mild drainage but WNL per pt report   LUMBAR ROM:    AROM 03/18/22  Flexion  75% distal to knee, mild pain  Extension  25% p!  Right lateral flexion    Left lateral flexion    Right rotation 50% stiffness  Left rotation  50% stiffness   (Blank rows = not tested) Comments: Deferred on eval given spine precautions 04/29/22: Deferred given time constraints and pt arriving with back brace donned     LOWER EXTREMITY MMT:     MMT Right eval Left eval Right 03/31/22 Left 03/31/22 R/L 04/29/22  Hip flexion 4p! 4p! 4+ P 4+ 5/5 (stiffness R side)  Hip abduction (modified sitting) 4+ 4+ 4+ 4+ 4+/4+  Hip internal rotation         Hip external rotation     4- 4 4+/4+  Knee flexion 5 4+ 5 5   Knee extension 4+ 4+ 5 5    (Blank rows = not tested)   Comments: mild pulling in hamstring with knee ext on R, no pain with resistance; mild pain with hip flex  B that resolves with cessation of movement       FUNCTIONAL TESTS:  Sit to stand transfer, standard chair: heavy use of UE on rollator and chair, reduced fwd weight shift, wide BOS, significantly inc time and  effort   TUG: 22 seconds from standard chair w/ rollator, gait mechanics as below, heavy UE use  02/16/22: TUG 12sec standard chair with rollator, continues with impaired gait mechanics and heavy UE use, fwd flexed posture  2 minute walk test: 03/31/22: 295 Feet  2MWT 04/29/22: 493f w/ rollator  3860fgait no AD, RPE 5/10, limited by fatigue, SBA 05/11/22: Tandem stance 30 sec      BERG BALANCE: 43/56 (04/23/22)  Sitting to Standing: Numbers; 0-4: 3  4. Stands without using hands and stabilize independently  3. Stands independently using hands  2. Stands using hands after multiple trials  1. Min A to stand  0. Mod-Max A to stand Standing unsupported: Numbers; 0-4: 4  4. Stands safely for 2 minutes  3. Stands 2 minutes with supervision  2. Stands 30 seconds unsupported  1. Needs several tries to stand unsupported for 30 seconds  0. Unable to stand unsupported for 30 seconds Sitting unsupported: Numbers; 0-4: 4  4. Sits for 2 minutes independently  3. Sits for 2 minutes with supervision  2. Able to sit 30 seconds  1. Able to sit 10 seconds  0. Unable to sit for 10 seconds Standing to Sitting: Numbers; 0-4: 3 4. Sits safely with minimal use of hands 3. Controls descent with hands 2. Uses back of legs against chair to control descent 1. Sits independently, but uncontrolled descent 0. Needs assistance Transfers: Numbers; 0-4: 3  4. Transfers safely with minor use of hands  3. Transfers safely definite use of hands  2. Transfers with verbal cueing/supervision  1. Needs 1 person assist  0. Needs 2 person assist  Standing with eyes closed: Numbers; 0-4: 4  4. Stands safely for 10 seconds  3. Stands 10 seconds with supervision   2. Able to stand for 3 seconds  1. Unable to keep eyes closed for 3 seconds, but is safe  0. Needs assist to keep from falling Standing with feet together: Numbers; 0-4: 4 4. Stands for 1 minute safely 3. Stands for 1 minute with supervision 2. Unable to  hold for 30 seconds  1. Needs help to attain position but can hold for 15 seconds  0. Needs help to attain position and unable to hold for 15 seconds Reaching forward with outstretched arm: Numbers; 0-4: 3  4. Reaches forward 10 inches  3. Reaches forward 5 inches  2. Reaches forward 2 inches  1. Reaches forward with supervision  0. Loses balance/requires assistace Retrieving object from the floor: Numbers; 0-4: 3 4. Able to pick up easily and safely 3. Able to pick up with supervision 2. Unable to pick up, but reaches within 1-2 inches independently 1. Unable to pick up and needs supervision 0. Unable/needs assistance to keep from falling  Turning to look behind: Numbers; 0-4: 2  4. Looks behind from both sides and weight shifts well  3. Looks behind one side only, other side less weight shift  2. Turns sideways only, maintains balance  1. Needs supervision when turning  0. Needs assistance  Turning 360 degrees: Numbers; 0-4: 2  4. Able to turn in </=4 seconds  3. Able to turn on one side in </= 4 seconds   2. Able to turn slowly, but  safely  1. Needs supervision or verbal cueing  0. Needs assistance Place alternate foot on stool: Numbers; 0-4: 4 4. Completes 8 steps in 20 seconds 3. Completes 8 steps in >20 seconds 2. 4 steps without assistance/supervision 1. Completes >2 steps with minimal assist 0. Unable, needs assist to keep from falling Standing with one foot in front: Numbers; 0-4: 1  4. Independent tandem for 30 seconds  3. Independent foot ahead for 30 seconds  2. Independent small step for 30 seconds  1. Needs help to step, but can hold for 15 seconds  0. Loses balance while standing/stepping Standing on one foot: Numbers; 0-4: 3 4. Holds >10 seconds 3. Holds 5-10 seconds 2. Holds >/=3 seconds  1. Holds <3 seconds 0. Unable   Total Score: 43/56     GAIT: Distance walked: within clinic Assistive device utilized: rollator Level of assistance: Modified  independence Comments: significant forward flexed posture, partial step through pattern with intermittent step to, reduced hip extension B, reduced knee ROM throughout swing/stance    TODAY'S TREATMENT:  [facility] Adult PT Treatment:                                                DATE: 06/10/22 Therapeutic Exercise: Nustep L6 UE/LE x 6 minutes Palloff press 10# 10 x 2 each way Row 27# Free motion 10 x 2  STS elevated chair x 10 -needs UE for rise and lower Standing heel raises 10 x 2  Standing hip ext alternating x 10 each    [facility] Adult PT Treatment:                                                DATE: 06/03/22 Therapeutic Exercise: Nu step L5 LE/UE 13mn during subjective  Cable column paloff press 7# x10, 10# x10 B cues for posture  Cable column unilat row 10# x10 each UE, 13# x10 each UE Farmer carry 10# B [id number] for posture and pacing RTB around wrist, B scaption w/ emphasis on thoracic extension, 2x12 cues for posture and pacing DB chest press, 5# seated for improved postural endurance 2x10 cues for form, sitting position   [facility]                                                [facility]: [facility] Therapeutic Exercise: Nu step L6 6 min UE/LE during subjective STS x5 superset with hip IR AAROM x5 B LE seated, 3 bouts cues for trunk lean with STS and foot positioning  Adductor iso 2x10 cues for maintaining tension Standing TKE ball at wall, 2x10 each LE cues for reduced compensations at hip, posture Heel raises at counter 3x15 cues for foot positioning  HEP handout + review, discussed alignment w/ exercise   [facility]                                                DATE: 05/27/[facility] Therapeutic  Exercise: Nu step L7 6 min UE/LE during subjective Hip IR AAROM seated 2x10 each LE cues for form and comfortable  Cable column paloff press x10 7# cues for posture  Cable column unilat row 10# x10 B UE cues for setup Cable column unilat chest press 10# x10 each UE  cues for form, trunk positioning, and appropriate core activation  Therapeutic Activity: FOTO + education STS superset with hip IR AAROM (see above) 3x5 from mat + airex, requires significant rest breaks due to increased fatigue with neutral positioning Staggered stance weight shift (partial lunge) for improved mechanics, reduced ER at hips x8 each LE     PATIENT EDUCATION:  Education details: rationale for interventions Person educated: Patient Education method: Explanation, Demonstration, Tactile cues, Verbal cues Education comprehension: verbalized understanding, returned demonstration, verbal cues required, tactile cues required, and needs further education    HOME EXERCISE PROGRAM: Access Code: [email or web address] URL: [email or web address] Date: 06/02/2022 Prepared by: Enis Slipper  Exercises - Sit to Stand with Armchair  - 1 x daily - 7 x weekly - 2 sets - 10 reps - Heel Raises with Counter Support  - 1 x daily - 7 x weekly - 2 sets - 10 reps - Standing Row with Anchored Resistance  - 1 x daily - 7 x weekly - 2-3 sets - 10 reps - 5 hold - Shoulder extension with resistance - Neutral  - 1 x daily - 7 x weekly - 2-3 sets - 10 reps - 5 hold - Standing Anti-Rotation Press with Anchored Resistance  - 1 x daily - 7 x weekly - 1-2 sets - 10 reps - 3-5 hold - Seated Hip Adduction Isometrics with Ball  - 1 x daily - 7 x weekly - 3 sets - 10 reps   ASSESSMENT:   CLINICAL IMPRESSION: Pt arrives w/ 8/10 stiffness. He reports pain is not that bad, just stiff. He requested shortened session due to seeing his orthopedic surgeon next today. He plans to request more PT from MD. Continued with standing core and posterior chain strengthening. He tolerated well without increased pain. He requires UE for STS from elevated seat.        OBJECTIVE IMPAIRMENTS: Abnormal gait, decreased activity tolerance, decreased balance, decreased endurance, decreased mobility, difficulty walking, decreased  ROM, decreased strength, hypomobility, improper body mechanics, postural dysfunction, and pain.    ACTIVITY LIMITATIONS: carrying, lifting, bending, sitting, standing, squatting, sleeping, stairs, transfers, bed mobility, bathing, toileting, dressing, and locomotion level   PARTICIPATION LIMITATIONS: meal prep, cleaning, laundry, shopping, community activity, and yard work   PERSONAL FACTORS: Time since onset of injury/illness/exacerbation and 3+ comorbidities: CAD, PE, DVT, HTN  are also affecting patient's functional outcome.    REHAB POTENTIAL: Good   CLINICAL DECISION MAKING: Stable/uncomplicated   EVALUATION COMPLEXITY: Low     GOALS: Goals reviewed with patient? Yes   SHORT TERM GOALS: Target date: 02/25/2022   Pt will demonstrate appropriate understanding and performance of initially prescribed HEP in order to facilitate improved independence with management of symptoms.  Baseline: HEP provided on eval 03/04/22: Pt reports independence w/ HEP Goal status: MET   2. Pt will score greater than or equal to 42% on FOTO in order to demonstrate improved perception of function due to symptoms.            Baseline: 32% 03/04/22: 32% 03/31/22: 35% 04/29/22: 37%             Goal status: ONGOING   LONG TERM GOALS: Target date:  05/29/22 (updated 04/29/22)              Pt will score 51% on FOTO in order to demonstrate improved perception of functional status due to symptoms.  Baseline: 32% 03/31/22: 35% 04/29/22: 37%  Goal status: ONGOING     2.  Pt will demonstrate ability to ambulate at least 1037f with LRAD and no more than 2pt increase in baseline pain on NPS in order to facilitate improved tolerance to community ambulation.  Baseline: pt unable to tolerate greater than household distances due to significant inc in pain 03/31/22: able to complete 2 minute walk test at 295 feet with increased pain from 5/10 to 7/10.  04/29/22: [id number]/ rollator no increase in pain 2MWT, significant fatigue;  [id number] AD limited by fatigue    Goal status: PROGRESSING   3.  Pt will demonstrate hip flexion MMT of 5/5 in order to facilitate improved clearance with gait for reduced fall risk.  Baseline: 4/5 B w pain 03/31/21: see chart 04/29/22: see chart Goal status: MET   4. Pt will be able to perform TUG in less than or equal to 13 sec with LRAD in order to indicate reduced risk of falling (cutoff score for fall risk 13.5 sec in community dwelling older adults per [facility], 2000)            Baseline: 22sec with rollator  02/16/22: 12sec with rollator             Goal status: MET   5. Pt will score >47/56 on BERG to indicate reduced fall risk and increased safety w/ mobility.   04/23/22: 43/56  Goal status: NEW (04/29/22)   PLAN (updated 04/29/22):   PT FREQUENCY: 2x/week   PT DURATION: 4 weeks   PLANNED INTERVENTIONS: Therapeutic exercises, Therapeutic activity, Neuromuscular re-education, Balance training, Gait training, Patient/Family education, Self Care, Stair training, DME instructions, Aquatic Therapy, Cryotherapy, Moist heat, Taping, Manual therapy, and Re-evaluation.   PLAN FOR NEXT SESSION:   Review/update HEP (hip mobility). Gait/functional mobility  JesHessie DienerTADelaware/13/24 12:29 PM Phone: [phone]: [phone]

## 2022-06-12 ENCOUNTER — Ambulatory Visit: Payer: Commercial Managed Care - PPO | Admitting: Physical Therapy

## 2022-06-12 ENCOUNTER — Telehealth: Payer: Self-pay | Admitting: Physical Therapy

## 2022-06-12 NOTE — Telephone Encounter (Signed)
Left voicemail regarding no-show to morning appointment. Left reminder for next appointment time and also reminder of attendance policy.

## 2022-06-16 ENCOUNTER — Ambulatory Visit: Payer: Commercial Managed Care - PPO | Admitting: Physical Therapy
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
## 2022-06-19 ENCOUNTER — Ambulatory Visit: Payer: Commercial Managed Care - PPO | Admitting: Physical Therapy

## 2022-06-19 ENCOUNTER — Encounter: Payer: Self-pay | Admitting: Physical Therapy

## 2022-06-19 DIAGNOSIS — M6281 Muscle weakness (generalized): Secondary | ICD-10-CM

## 2022-06-19 DIAGNOSIS — R2689 Other abnormalities of gait and mobility: Secondary | ICD-10-CM

## 2022-06-19 DIAGNOSIS — M5459 Other low back pain: Secondary | ICD-10-CM
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
## 2022-06-23 ENCOUNTER — Encounter: Payer: Self-pay | Admitting: Physical Therapy

## 2022-06-23 ENCOUNTER — Ambulatory Visit: Payer: Commercial Managed Care - PPO | Admitting: Physical Therapy

## 2022-06-23 DIAGNOSIS — R2689 Other abnormalities of gait and mobility: Secondary | ICD-10-CM | POA: Diagnosis not present

## 2022-06-23 DIAGNOSIS — M6281 Muscle weakness (generalized): Secondary | ICD-10-CM | POA: Diagnosis not present

## 2022-06-23 DIAGNOSIS — M5459 Other low back pain: Secondary | ICD-10-CM | POA: Diagnosis not present

## 2022-06-24 NOTE — Therapy (Signed)
OUTPATIENT PHYSICAL THERAPY TREATMENT NOTE (NO CHARGE VISIT)   [name] MRN: [id number] DOB:January 27, 1966, 57 y.o., male Today's Date: 06/25/2022    PCP: Holley Bouche, MD   REFERRING PROVIDER: Judith Part, MD  END OF SESSION:   PT End of Session - 06/25/22 0850     Visit Number 23    PT Start Time 0845                    Past Medical History:  Diagnosis Date   Acute pulmonary embolus ([place])    bilateral submassive PE in setting of missing several doses of Xarelto for his DVT   Arthritis    DVT (deep venous thrombosis) ([place]) 11/28/2013   RT LEG   High cholesterol    Hypertension    Lumbar spine scoliosis    Marijuana use    Sleep apnea    Past Surgical History:  Procedure Laterality Date   ABDOMINAL EXPOSURE N/A 01/14/2022   Procedure: ABDOMINAL EXPOSURE;  Surgeon: Marty Heck, MD;  Location: [facility];  Service: Vascular;  Laterality: N/A;   ANTERIOR LUMBAR FUSION Left 01/14/2022   Procedure: Lumbar three-four Lumbar four-five Oblique Lumbar Interbody Fusion;  Surgeon: Judith Part, MD;  Location: [place];  Service: Neurosurgery;  Laterality: Left;   HERNIA REPAIR     [id number] and [id number]; umbilical hernia repair   KNEE SURGERY Bilateral    Left knee 1993, right knee 2004   TONSILLECTOMY     TOTAL KNEE ARTHROPLASTY Left 02/10/2017   Procedure: LEFT TOTAL KNEE ARTHROPLASTY;  Surgeon: Leandrew Koyanagi, MD;  Location: [place];  Service: Orthopedics;  Laterality: Left;   TOTAL KNEE ARTHROPLASTY Right 07/28/2017   Procedure: RIGHT TOTAL KNEE ARTHROPLASTY;  Surgeon: Leandrew Koyanagi, MD;  Location: [place];  Service: Orthopedics;  Laterality: Right;   TRANSFORAMINAL LUMBAR INTERBODY FUSION W/ MIS 1 LEVEL N/A 01/14/2022   Procedure: OPEN TRANSFORAMINAL LUMBAR INTERBODY FUSION  LUMBAR FIVE-SACRAL ONE ,LAMINECTOMIES AND POSTERIOR LATERAL INSTRUMENTATION FUSION  LUMBAR THREE TO FIVE SACRAL ONE;  Surgeon: Judith Part, MD;  Location: [place];   Service: Neurosurgery;  Laterality: N/A;   Patient Active Problem List   Diagnosis Date Noted   Leg swelling 02/23/2022   Scoliosis 01/14/2022   Snoring 09/22/2021   Insomnia due to mental condition 09/22/2021   At risk for mood disturbance 09/01/2021   Sleep disturbance 07/28/2021   Chronic bilateral low back pain without sciatica 07/25/2019   Neurogenic claudication 05/19/2019   Hyperlipidemia LDL goal <70 07/20/2018   History of pulmonary embolus (PE) 07/20/2017   Mild CAD 07/20/2017   S/p total knee replacement, bilateral 02/10/2017   Chronic anticoagulation 01/26/2017   Pulmonary embolism ([place]) 06/03/2016   Encounter for chronic pain management 04/06/2014   Tobacco abuse 12/05/2013   DVT (deep venous thrombosis) ([place]) 11/28/2013   HTN (hypertension) 05/04/2013   Lumbar back pain 05/04/2013   BMI 33.0-33.9,adult 05/04/2013   Hx of substance abuse ([place]) 05/04/2013    REFERRING DIAG:  M54.50 (ICD-10-CM) - Lumbar back pain  THERAPY DIAG:  Other low back pain  Muscle weakness (generalized)  Other abnormalities of gait and mobility  Rationale for Evaluation and Treatment Rehabilitation  PERTINENT HISTORY:  CAD, PE, DVT, HTN S/p lumbar interbody fusion 01/14/22  PRECAUTIONS: Back, abdominal and posterior incisions Per pt on 03/18/22, spine precautions lifted by surgeon at most recent visit     SUBJECTIVE:  SUBJECTIVE STATEMENT:  06/25/2022 pt arrives and states he can't participate today, having some stomach issues. Otherwise doing quite well from pain perspective.     PAIN:  Are you having pain:  0/10 Location: low back How would you describe your pain? Stiff, achey Best in past week: 3/10 with movement, no resting pain at best Worst in past week: 4/10 Aggravating factors:  walking/standing household distances, prolonged sitting Easing factors: rest breaks, ice    OBJECTIVE: (objective measures completed at initial evaluation unless otherwise dated)    DIAGNOSTIC FINDINGS:  Post op interbody fusion lumbar   PATIENT SURVEYS:  FOTO 32% FOTO 03/04/22 32% FOTO 03/31/22 35% 04/29/22: 37%  05/27/22: 40% 06/23/22: 42%    COGNITION: Overall cognitive status: Within functional limits for tasks assessed                          SENSATION: Light touch intact B LEs     POSTURE: increased kyphosis and forward head posture, UT elevation   PALPATION/OBSERVATION: Deferred as pt wears back brace to eval - states he saw MD this morning and no concerns; abdominal incision healed and posterior incision with mild drainage but WNL per pt report   LUMBAR ROM:    AROM 03/18/22  Flexion  75% distal to knee, mild pain  Extension  25% p!  Right lateral flexion    Left lateral flexion    Right rotation 50% stiffness  Left rotation  50% stiffness   (Blank rows = not tested) Comments: Deferred on eval given spine precautions 04/29/22: Deferred given time constraints and pt arriving with back brace donned     LOWER EXTREMITY MMT:     MMT Right eval Left eval Right 03/31/22 Left 03/31/22 R/L 04/29/22  Hip flexion 4p! 4p! 4+ P 4+ 5/5 (stiffness R side)  Hip abduction (modified sitting) 4+ 4+ 4+ 4+ 4+/4+  Hip internal rotation         Hip external rotation     4- 4 4+/4+  Knee flexion 5 4+ 5 5   Knee extension 4+ 4+ 5 5    (Blank rows = not tested)   Comments: mild pulling in hamstring with knee ext on R, no pain with resistance; mild pain with hip flex B that resolves with cessation of movement       FUNCTIONAL TESTS:  Sit to stand transfer, standard chair: heavy use of UE on rollator and chair, reduced fwd weight shift, wide BOS, significantly inc time and effort   TUG: 22 seconds from standard chair w/ rollator, gait mechanics as below, heavy UE use  02/16/22:  TUG 12sec standard chair with rollator, continues with impaired gait mechanics and heavy UE use, fwd flexed posture  2 minute walk test: 03/31/22: 295 Feet  2MWT 04/29/22: 486ft w/ rollator  380ft gait no AD, RPE 5/10, limited by fatigue, SBA 05/11/22: Tandem stance 30 sec   06/23/22:  BERG 46/56 (see below) 2MWT no AD, SBA 433ft 8/10 RPE reported (HR up to 118bpm, SpO2 98%. Recovers to 89bpm sitting)        BERG BALANCE: 43/56 (04/23/22)    06/23/22 BERG Sitting to Standing: Numbers; 0-4: 3  4. Stands without using hands and stabilize independently  3. Stands independently using hands  2. Stands using hands after multiple trials  1. Min A to stand  0. Mod-Max A to stand Standing unsupported: Numbers; 0-4: 4  4. Stands safely for 2 minutes  3.  Stands 2 minutes with supervision  2. Stands 30 seconds unsupported  1. Needs several tries to stand unsupported for 30 seconds  0. Unable to stand unsupported for 30 seconds Sitting unsupported: Numbers; 0-4: 4  4. Sits for 2 minutes independently  3. Sits for 2 minutes with supervision  2. Able to sit 30 seconds  1. Able to sit 10 seconds  0. Unable to sit for 10 seconds Standing to Sitting: Numbers; 0-4: 3 4. Sits safely with minimal use of hands 3. Controls descent with hands 2. Uses back of legs against chair to control descent 1. Sits independently, but uncontrolled descent 0. Needs assistance Transfers: Numbers; 0-4: 3  4. Transfers safely with minor use of hands  3. Transfers safely definite use of hands  2. Transfers with verbal cueing/supervision  1. Needs 1 person assist  0. Needs 2 person assist  Standing with eyes closed: Numbers; 0-4: 4  4. Stands safely for 10 seconds  3. Stands 10 seconds with supervision   2. Able to stand for 3 seconds  1. Unable to keep eyes closed for 3 seconds, but is safe  0. Needs assist to keep from falling Standing with feet together: Numbers; 0-4: 4 4. Stands for 1 minute safely 3.  Stands for 1 minute with supervision 2. Unable to hold for 30 seconds  1. Needs help to attain position but can hold for 15 seconds  0. Needs help to attain position and unable to hold for 15 seconds Reaching forward with outstretched arm: Numbers; 0-4: 4  4. Reaches forward 10 inches  3. Reaches forward 5 inches  2. Reaches forward 2 inches  1. Reaches forward with supervision  0. Loses balance/requires assistace Retrieving object from the floor: Numbers; 0-4: 1 4. Able to pick up easily and safely 3. Able to pick up with supervision 2. Unable to pick up, but reaches within 1-2 inches independently 1. Unable to pick up and needs supervision 0. Unable/needs assistance to keep from falling  Turning to look behind: Numbers; 0-4: 4  4. Looks behind from both sides and weight shifts well  3. Looks behind one side only, other side less weight shift  2. Turns sideways only, maintains balance  1. Needs supervision when turning  0. Needs assistance  Turning 360 degrees: Numbers; 0-4: 2  4. Able to turn in </=4 seconds  3. Able to turn on one side in </= 4 seconds   2. Able to turn slowly, but safely  1. Needs supervision or verbal cueing  0. Needs assistance Place alternate foot on stool: Numbers; 0-4: 4 4. Completes 8 steps in 20 seconds 3. Completes 8 steps in >20 seconds 2. 4 steps without assistance/supervision 1. Completes >2 steps with minimal assist 0. Unable, needs assist to keep from falling Standing with one foot in front: Numbers; 0-4: 3  4. Independent tandem for 30 seconds  3. Independent foot ahead for 30 seconds  2. Independent small step for 30 seconds  1. Needs help to step, but can hold for 15 seconds  0. Loses balance while standing/stepping Standing on one foot: Numbers; 0-4: 3 4. Holds >10 seconds 3. Holds 5-10 seconds 2. Holds >/=3 seconds  1. Holds <3 seconds 0. Unable   Total Score: 46/56      GAIT: Distance walked: within clinic Assistive device  utilized: rollator Level of assistance: Modified independence Comments: significant forward flexed posture, partial step through pattern with intermittent step to, reduced hip extension B, reduced knee  ROM throughout swing/stance    TODAY'S TREATMENT:  [facility] Adult PT Treatment:                                                DATE: 06/25/22 No charge visit, see subjective/assessment  [facility] Adult PT Treatment:                                                DATE: 06/23/22 Therapeutic Activity: FOTO + education MSK assessment + education BERG + education 2MWT + education Education re: goals and PT POC, relation to functional tasks and daily activities   [facility] Adult PT Treatment:                                                DATE: 06/19/22 Therapeutic Exercise: Nu step L5 LE/UE during subjective 5 min  CC paloff press 13# 2x10 BIL cues for posture and pacing CC unilat row 27# 2x10 cues for posture Hip IR AAROM B, seated, x8 cues for pacing and full ROM  Seated med ball fwd press 3kg 2x12 cues for posture and velocity  Seated truncal rotation AROM 2x5 BIL cues for comfortable ROM, breath control GTB seated abd 2x15  HEP update + handout   PATIENT EDUCATION:  Education details: rationale for interventions Person educated: Patient Education method: Explanation, Demonstration, Tactile cues, Verbal cues Education comprehension: verbalized understanding, returned demonstration, verbal cues required, tactile cues required, and needs further education    HOME EXERCISE PROGRAM: Access Code: [email or web address] URL: [email or web address] Date: 06/19/2022 Prepared by: Enis Slipper  Exercises - Sit to Stand with Armchair  - 1 x daily - 7 x weekly - 2 sets - 10 reps - Heel Raises with Counter Support  - 1 x daily - 7 x weekly - 2 sets - 10 reps - Standing Row with Anchored Resistance  - 1 x daily - 7 x weekly - 2-3 sets - 10 reps - 5 hold - Shoulder extension with resistance - Neutral  - 1  x daily - 7 x weekly - 2-3 sets - 10 reps - 5 hold - Standing Anti-Rotation Press with Anchored Resistance  - 1 x daily - 7 x weekly - 1-2 sets - 10 reps - 3-5 hold - Seated Hip Adduction Isometrics with Ball  - 1 x daily - 7 x weekly - 3 sets - 10 reps - Seated Trunk Rotation - Arms Crossed  - 1 x daily - 7 x weekly - 3 sets - 5 reps   ASSESSMENT:   CLINICAL IMPRESSION: Pt arrives w/o pain, but states he cannot do PT today due to stomach issues. Wanted to come into clinic for scheduling and to let us know. Otherwise doing quite well from pain perspective, states he looks forward to next visit. Departs in no acute distress, no charge visit.     OBJECTIVE IMPAIRMENTS: Abnormal gait, decreased activity tolerance, decreased balance, decreased endurance, decreased mobility, difficulty walking, decreased ROM, decreased strength, hypomobility, improper body mechanics, postural dysfunction, and pain.    ACTIVITY LIMITATIONS: carrying, lifting, bending, sitting, standing, squatting, sleeping, stairs, transfers, bed  mobility, bathing, toileting, dressing, and locomotion level   PARTICIPATION LIMITATIONS: meal prep, cleaning, laundry, shopping, community activity, and yard work   PERSONAL FACTORS: Time since onset of injury/illness/exacerbation and 3+ comorbidities: CAD, PE, DVT, HTN  are also affecting patient's functional outcome.    REHAB POTENTIAL: Good   CLINICAL DECISION MAKING: Stable/uncomplicated   EVALUATION COMPLEXITY: Low     GOALS: Goals reviewed with patient? Yes   SHORT TERM GOALS: Target date: 02/25/2022   Pt will demonstrate appropriate understanding and performance of initially prescribed HEP in order to facilitate improved independence with management of symptoms.  Baseline: HEP provided on eval 03/04/22: Pt reports independence w/ HEP Goal status: MET   2. Pt will score greater than or equal to 42% on FOTO in order to demonstrate improved perception of function due to  symptoms.            Baseline: 32% 03/04/22: 32% 03/31/22: 35% 04/29/22: 37%  06/23/22: 42%             Goal status:  MET   LONG TERM GOALS: Target date: 07/21/2022 (updated 06/23/22)            Pt will score 51% on FOTO in order to demonstrate improved perception of functional status due to symptoms.  Baseline: 32% 03/31/22: 35% 04/29/22: 37%  06/23/22: 42%  Goal status: PROGRESSING   2.  Pt will demonstrate ability to ambulate at least 1063ft with LRAD and no more than 2pt increase in baseline pain on NPS in order to facilitate improved tolerance to community ambulation.  Baseline: pt unable to tolerate greater than household distances due to significant inc in pain 03/31/22: able to complete 2 minute walk test at 295 feet with increased pain from 5/10 to 7/10.  04/29/22: 415ft w/ rollator no increase in pain 2MWT, significant fatigue; 354ft no AD limited by fatigue    06/23/22: 446ft no AD 2MWT limited by fatigue, increase in pain reported  Goal status: PROGRESSING   3.  Pt will demonstrate hip flexion MMT of 5/5 in order to facilitate improved clearance with gait for reduced fall risk.  Baseline: 4/5 B w pain 03/31/21: see chart 04/29/22: see chart Goal status: MET   4. Pt will be able to perform TUG in less than or equal to 13 sec with LRAD in order to indicate reduced risk of falling (cutoff score for fall risk 13.5 sec in community dwelling older adults per [facility] et al, 2000)            Baseline: 22sec with rollator  02/16/22: 12sec with rollator             Goal status: MET   5. Pt will score >47/56 on BERG to indicate reduced fall risk and increased safety w/ mobility.   04/23/22: 43/56  06/23/22: 46/56   Goal status: NEARLY MET   PLAN (updated 06/23/22):   PT FREQUENCY: 2x/week   PT DURATION: 3 weeks   PLANNED INTERVENTIONS: Therapeutic exercises, Therapeutic activity, Neuromuscular re-education, Balance training, Gait training, Patient/Family education, Self Care, Stair  training, DME instructions, Aquatic Therapy, Cryotherapy, Moist heat, Taping, Manual therapy, and Re-evaluation.   PLAN FOR NEXT SESSION:   Review/update HEP (hip mobility). Gait/functional mobility    Leeroy Cha PT, DPT 06/25/2022 8:52 AM

## 2022-06-25 ENCOUNTER — Ambulatory Visit: Payer: Commercial Managed Care - PPO | Admitting: Physical Therapy

## 2022-06-25 ENCOUNTER — Encounter: Payer: Self-pay | Admitting: Physical Therapy

## 2022-06-25 DIAGNOSIS — R2689 Other abnormalities of gait and mobility: Secondary | ICD-10-CM

## 2022-06-25 DIAGNOSIS — M5459 Other low back pain: Secondary | ICD-10-CM

## 2022-06-25 DIAGNOSIS — M6281 Muscle weakness (generalized): Secondary | ICD-10-CM

## 2022-07-03 DIAGNOSIS — M4326 Fusion of spine, lumbar region: Secondary | ICD-10-CM | POA: Diagnosis not present

## 2022-07-08 ENCOUNTER — Telehealth: Payer: Self-pay | Admitting: Physical Therapy

## 2022-07-08 ENCOUNTER — Ambulatory Visit: Payer: Commercial Managed Care - PPO | Attending: Cardiology | Admitting: Physical Therapy

## 2022-07-08 DIAGNOSIS — M5459 Other low back pain: Secondary | ICD-10-CM | POA: Insufficient documentation

## 2022-07-08 DIAGNOSIS — M6281 Muscle weakness (generalized): Secondary | ICD-10-CM | POA: Insufficient documentation

## 2022-07-08 DIAGNOSIS — R2689 Other abnormalities of gait and mobility: Secondary | ICD-10-CM | POA: Insufficient documentation

## 2022-07-08 NOTE — Telephone Encounter (Signed)
Contacted patient regarding no show to appointment this morning at 845. Left reminder or next appointment dates and a reminder of attendance policy.

## 2022-07-13 NOTE — Therapy (Signed)
OUTPATIENT PHYSICAL THERAPY TREATMENT NOTE (NO CHARGE VISIT)   [name] MRN: [id number] DOB:1965/10/05, 57 y.o., male Today's Date: 07/13/2022    PCP: Bess Kinds, MD   REFERRING PROVIDER: Jadene Pierini, MD  END OF SESSION:            Past Medical History:  Diagnosis Date   Acute pulmonary embolus (HCC)    bilateral submassive PE in setting of missing several doses of Xarelto for his DVT   Arthritis    DVT (deep venous thrombosis) (HCC) 11/28/2013   RT LEG   High cholesterol    Hypertension    Lumbar spine scoliosis    Marijuana use    Sleep apnea    Past Surgical History:  Procedure Laterality Date   ABDOMINAL EXPOSURE N/A 01/14/2022   Procedure: ABDOMINAL EXPOSURE;  Surgeon: Cephus Shelling, MD;  [facility];  Service: Vascular;  Laterality: N/A;   ANTERIOR LUMBAR FUSION Left 01/14/2022   Procedure: Lumbar three-four Lumbar four-five Oblique Lumbar Interbody Fusion;  Surgeon: Jadene Pierini, MD;  Location: [facility] OR;  Service: Neurosurgery;  Laterality: Left;   HERNIA REPAIR     2005 and 2011; umbilical hernia repair   KNEE SURGERY Bilateral    Left knee 1993, right knee 2004   TONSILLECTOMY     TOTAL KNEE ARTHROPLASTY Left 02/10/2017   Procedure: LEFT TOTAL KNEE ARTHROPLASTY;  Surgeon: Tarry Kos, MD;  Location: [facility] OR;  Service: Orthopedics;  Laterality: Left;   TOTAL KNEE ARTHROPLASTY Right 07/28/2017   Procedure: RIGHT TOTAL KNEE ARTHROPLASTY;  Surgeon: Tarry Kos, MD;  Location: [facility] OR;  Service: Orthopedics;  Laterality: Right;   TRANSFORAMINAL LUMBAR INTERBODY FUSION W/ MIS 1 LEVEL N/A 01/14/2022   Procedure: OPEN TRANSFORAMINAL LUMBAR INTERBODY FUSION  LUMBAR FIVE-SACRAL ONE ,LAMINECTOMIES AND POSTERIOR LATERAL INSTRUMENTATION FUSION  LUMBAR THREE TO FIVE SACRAL ONE;  Surgeon: Jadene Pierini, MD;  Location: [facility] OR;  Service: Neurosurgery;  Laterality: N/A;   Patient Active Problem List   Diagnosis Date  Noted   Leg swelling 02/23/2022   Scoliosis 01/14/2022   Snoring 09/22/2021   Insomnia due to mental condition 09/22/2021   At risk for mood disturbance 09/01/2021   Sleep disturbance 07/28/2021   Chronic bilateral low back pain without sciatica 07/25/2019   Neurogenic claudication 05/19/2019   Hyperlipidemia LDL goal <70 07/20/2018   History of pulmonary embolus (PE) 07/20/2017   Mild CAD 07/20/2017   S/p total knee replacement, bilateral 02/10/2017   Chronic anticoagulation 01/26/2017   Pulmonary embolism 06/03/2016   Encounter for chronic pain management 04/06/2014   Tobacco abuse 12/05/2013   DVT (deep venous thrombosis) (HCC) 11/28/2013   HTN (hypertension) 05/04/2013   Lumbar back pain 05/04/2013   BMI 33.0-33.9,adult 05/04/2013   Hx of substance abuse 05/04/2013    REFERRING DIAG:  M54.50 (ICD-10-CM) - Lumbar back pain  THERAPY DIAG:  No diagnosis found.  Rationale for Evaluation and Treatment Rehabilitation  PERTINENT HISTORY:  CAD, PE, DVT, HTN S/p lumbar interbody fusion 01/14/22  PRECAUTIONS: Back, abdominal and posterior incisions Per pt on 03/18/22, spine precautions lifted by surgeon at most recent visit     SUBJECTIVE:  SUBJECTIVE STATEMENT:  07/13/2022 ***  *** pt arrives and states he can't participate today, having some stomach issues. Otherwise doing quite well from pain perspective.     PAIN:  Are you having pain:  0/10 Location: low back How would you describe your pain? Stiff, achey Best in past week: 3/10 with movement, no resting pain at best Worst in past week: 4/10 Aggravating factors: walking/standing household distances, prolonged sitting Easing factors: rest breaks, ice    OBJECTIVE: (objective measures completed at initial evaluation unless otherwise  dated)    DIAGNOSTIC FINDINGS:  Post op interbody fusion lumbar   PATIENT SURVEYS:  FOTO 32% FOTO 03/04/22 32% FOTO 03/31/22 35% 04/29/22: 37%  05/27/22: 40% 06/23/22: 42%    COGNITION: Overall cognitive status: Within functional limits for tasks assessed                          SENSATION: Light touch intact B LEs     POSTURE: increased kyphosis and forward head posture, UT elevation   PALPATION/OBSERVATION: Deferred as pt wears back brace to eval - states he saw MD this morning and no concerns; abdominal incision healed and posterior incision with mild drainage but WNL per pt report   LUMBAR ROM:    AROM 03/18/22  Flexion  75% distal to knee, mild pain  Extension  25% p!  Right lateral flexion    Left lateral flexion    Right rotation 50% stiffness  Left rotation  50% stiffness   (Blank rows = not tested) Comments: Deferred on eval given spine precautions 04/29/22: Deferred given time constraints and pt arriving with back brace donned     LOWER EXTREMITY MMT:     MMT Right eval Left eval Right 03/31/22 Left 03/31/22 R/L 04/29/22  Hip flexion 4p! 4p! 4+ P 4+ 5/5 (stiffness R side)  Hip abduction (modified sitting) 4+ 4+ 4+ 4+ 4+/4+  Hip internal rotation         Hip external rotation     4- 4 4+/4+  Knee flexion 5 4+ 5 5   Knee extension 4+ 4+ 5 5    (Blank rows = not tested)   Comments: mild pulling in hamstring with knee ext on R, no pain with resistance; mild pain with hip flex B that resolves with cessation of movement       FUNCTIONAL TESTS:  Sit to stand transfer, standard chair: heavy use of UE on rollator and chair, reduced fwd weight shift, wide BOS, significantly inc time and effort   TUG: 22 seconds from standard chair w/ rollator, gait mechanics as below, heavy UE use  02/16/22: TUG 12sec standard chair with rollator, continues with impaired gait mechanics and heavy UE use, fwd flexed posture  2 minute walk test: 03/31/22: 295 Feet  04/29/22:  443ft w/ rollator  330ft gait no AD, RPE 5/10, limited by fatigue, SBA 05/11/22: Tandem stance 30 sec   06/23/22:  BERG 46/56 (see below) no AD, SBA 458ft 8/10 RPE reported (HR up to 118bpm, SpO2 98%. Recovers to 89bpm sitting)        BERG BALANCE: 43/56 (04/23/22)    06/23/22 BERG Sitting to Standing: Numbers; 0-4: 3  4. Stands without using hands and stabilize independently  3. Stands independently using hands  2. Stands using hands after multiple trials  1. Min A to stand  0. Mod-Max A to stand Standing unsupported: Numbers; 0-4: 4  4. Stands safely for 2  minutes  3. Stands 2 minutes with supervision  2. Stands 30 seconds unsupported  1. Needs several tries to stand unsupported for 30 seconds  0. Unable to stand unsupported for 30 seconds Sitting unsupported: Numbers; 0-4: 4  4. Sits for 2 minutes independently  3. Sits for 2 minutes with supervision  2. Able to sit 30 seconds  1. Able to sit 10 seconds  0. Unable to sit for 10 seconds Standing to Sitting: Numbers; 0-4: 3 4. Sits safely with minimal use of hands 3. Controls descent with hands 2. Uses back of legs against chair to control descent 1. Sits independently, but uncontrolled descent 0. Needs assistance Transfers: Numbers; 0-4: 3  4. Transfers safely with minor use of hands  3. Transfers safely definite use of hands  2. Transfers with verbal cueing/supervision  1. Needs 1 person assist  0. Needs 2 person assist  Standing with eyes closed: Numbers; 0-4: 4  4. Stands safely for 10 seconds  3. Stands 10 seconds with supervision   2. Able to stand for 3 seconds  1. Unable to keep eyes closed for 3 seconds, but is safe  0. Needs assist to keep from falling Standing with feet together: Numbers; 0-4: 4 4. Stands for 1 minute safely 3. Stands for 1 minute with supervision 2. Unable to hold for 30 seconds  1. Needs help to attain position but can hold for 15 seconds  0. Needs help to attain position and  unable to hold for 15 seconds Reaching forward with outstretched arm: Numbers; 0-4: 4  4. Reaches forward 10 inches  3. Reaches forward 5 inches  2. Reaches forward 2 inches  1. Reaches forward with supervision  0. Loses balance/requires assistace Retrieving object from the floor: Numbers; 0-4: 1 4. Able to pick up easily and safely 3. Able to pick up with supervision 2. Unable to pick up, but reaches within 1-2 inches independently 1. Unable to pick up and needs supervision 0. Unable/needs assistance to keep from falling  Turning to look behind: Numbers; 0-4: 4  4. Looks behind from both sides and weight shifts well  3. Looks behind one side only, other side less weight shift  2. Turns sideways only, maintains balance  1. Needs supervision when turning  0. Needs assistance  Turning 360 degrees: Numbers; 0-4: 2  4. Able to turn in </=4 seconds  3. Able to turn on one side in </= 4 seconds   2. Able to turn slowly, but safely  1. Needs supervision or verbal cueing  0. Needs assistance Place alternate foot on stool: Numbers; 0-4: 4 4. Completes 8 steps in 20 seconds 3. Completes 8 steps in >20 seconds 2. 4 steps without assistance/supervision 1. Completes >2 steps with minimal assist 0. Unable, needs assist to keep from falling Standing with one foot in front: Numbers; 0-4: 3  4. Independent tandem for 30 seconds  3. Independent foot ahead for 30 seconds  2. Independent small step for 30 seconds  1. Needs help to step, but can hold for 15 seconds  0. Loses balance while standing/stepping Standing on one foot: Numbers; 0-4: 3 4. Holds >10 seconds 3. Holds 5-10 seconds 2. Holds >/=3 seconds  1. Holds <3 seconds 0. Unable   Total Score: 46/56      GAIT: Distance walked: within clinic Assistive device utilized: rollator Level of assistance: Modified independence Comments: significant forward flexed posture, partial step through pattern with intermittent step to, reduced  hip extension  B, reduced knee ROM throughout swing/stance    TODAY'S TREATMENT:  [facility] Adult PT Treatment:                                                DATE: 07/14/22 Therapeutic Exercise: *** Manual Therapy: *** Neuromuscular re-ed: *** Therapeutic Activity: *** Modalities: *** Self Care: ***   [facility] Adult PT Treatment:                                                DATE: 06/25/22 No charge visit, see subjective/assessment  [facility] Adult PT Treatment:                                                DATE: 06/23/22 Therapeutic Activity: FOTO + education MSK assessment + education BERG + education + education Education re: goals and PT POC, relation to functional tasks and daily activities   [facility] Adult PT Treatment:                                                DATE: 06/19/22 Therapeutic Exercise: Nu step L5 LE/UE during subjective 5 min  CC paloff press 13# 2x10 BIL cues for posture and pacing CC unilat row 27# 2x10 cues for posture Hip IR AAROM B, seated, x8 cues for pacing and full ROM  Seated med ball fwd press 3kg 2x12 cues for posture and velocity  Seated truncal rotation AROM 2x5 BIL cues for comfortable ROM, breath control GTB seated abd 2x15  HEP update + handout   PATIENT EDUCATION:  Education details: rationale for interventions Person educated: Patient Education method: Explanation, Demonstration, Tactile cues, Verbal cues Education comprehension: verbalized understanding, returned demonstration, verbal cues required, tactile cues required, and needs further education    HOME EXERCISE PROGRAM: Access Code: [email or web address] URL: [email or web address] Date: 06/19/2022 Prepared by: Fransisco Hertz  Exercises - Sit to Stand with Armchair  - 1 x daily - 7 x weekly - 2 sets - 10 reps - Heel Raises with Counter Support  - 1 x daily - 7 x weekly - 2 sets - 10 reps - Standing Row with Anchored Resistance  - 1 x daily - 7 x weekly - 2-3 sets - 10 reps - 5  hold - Shoulder extension with resistance - Neutral  - 1 x daily - 7 x weekly - 2-3 sets - 10 reps - 5 hold - Standing Anti-Rotation Press with Anchored Resistance  - 1 x daily - 7 x weekly - 1-2 sets - 10 reps - 3-5 hold - Seated Hip Adduction Isometrics with Ball  - 1 x daily - 7 x weekly - 3 sets - 10 reps - Seated Trunk Rotation - Arms Crossed  - 1 x daily - 7 x weekly - 3 sets - 5 reps   ASSESSMENT:   CLINICAL IMPRESSION: ***  *** Pt arrives w/o pain, but states he cannot do PT today due to stomach issues. Wanted to  come into clinic for scheduling and to let us know. Otherwise doing quite well from pain perspective, states he looks forward to next visit. Departs in no acute distress, no charge visit.     OBJECTIVE IMPAIRMENTS: Abnormal gait, decreased activity tolerance, decreased balance, decreased endurance, decreased mobility, difficulty walking, decreased ROM, decreased strength, hypomobility, improper body mechanics, postural dysfunction, and pain.    ACTIVITY LIMITATIONS: carrying, lifting, bending, sitting, standing, squatting, sleeping, stairs, transfers, bed mobility, bathing, toileting, dressing, and locomotion level   PARTICIPATION LIMITATIONS: meal prep, cleaning, laundry, shopping, community activity, and yard work   PERSONAL FACTORS: Time since onset of injury/illness/exacerbation and 3+ comorbidities: CAD, PE, DVT, HTN  are also affecting patient's functional outcome.    REHAB POTENTIAL: Good   CLINICAL DECISION MAKING: Stable/uncomplicated   EVALUATION COMPLEXITY: Low     GOALS: Goals reviewed with patient? Yes   SHORT TERM GOALS: Target date: 02/25/2022   Pt will demonstrate appropriate understanding and performance of initially prescribed HEP in order to facilitate improved independence with management of symptoms.  Baseline: HEP provided on eval 03/04/22: Pt reports independence w/ HEP Goal status: MET   2. Pt will score greater than or equal to 42% on  FOTO in order to demonstrate improved perception of function due to symptoms.            Baseline: 32% 03/04/22: 32% 03/31/22: 35% 04/29/22: 37%  06/23/22: 42%             Goal status:  MET   LONG TERM GOALS: Target date: 07/21/2022 (updated 06/23/22)            Pt will score 51% on FOTO in order to demonstrate improved perception of functional status due to symptoms.  Baseline: 32% 03/31/22: 35% 04/29/22: 37%  06/23/22: 42%  Goal status: PROGRESSING   2.  Pt will demonstrate ability to ambulate at least 1058ft with LRAD and no more than 2pt increase in baseline pain on NPS in order to facilitate improved tolerance to community ambulation.  Baseline: pt unable to tolerate greater than household distances due to significant inc in pain 03/31/22: able to complete 2 minute walk test at 295 feet with increased pain from 5/10 to 7/10.  04/29/22: 44ft w/ rollator no increase in pain , significant fatigue; 31ft no AD limited by fatigue    06/23/22: 459ft no AD limited by fatigue, increase in pain reported  Goal status: PROGRESSING   3.  Pt will demonstrate hip flexion MMT of 5/5 in order to facilitate improved clearance with gait for reduced fall risk.  Baseline: 4/5 B w pain 03/31/21: see chart 04/29/22: see chart Goal status: MET   4. Pt will be able to perform TUG in less than or equal to 13 sec with LRAD in order to indicate reduced risk of falling (cutoff score for fall risk 13.5 sec in community dwelling older adults per [facility] et al, 2000)            Baseline: 22sec with rollator  02/16/22: 12sec with rollator             Goal status: MET   5. Pt will score >47/56 on BERG to indicate reduced fall risk and increased safety w/ mobility.   04/23/22: 43/56  06/23/22: 46/56   Goal status: NEARLY MET   PLAN (updated 06/23/22):   PT FREQUENCY: 2x/week   PT DURATION: 3 weeks   PLANNED INTERVENTIONS: Therapeutic exercises, Therapeutic activity, Neuromuscular re-education, Balance  training, Gait training,  Patient/Family education, Self Care, Stair training, DME instructions, Aquatic Therapy, Cryotherapy, Moist heat, Taping, Manual therapy, and Re-evaluation.   PLAN FOR NEXT SESSION:   Review/update HEP (hip mobility). Gait/functional mobility  ***   Ashley Murrain PT, DPT 07/13/2022 12:34 PM

## 2022-07-14 ENCOUNTER — Encounter: Payer: Self-pay | Admitting: Physical Therapy

## 2022-07-14 ENCOUNTER — Ambulatory Visit: Payer: Commercial Managed Care - PPO | Admitting: Physical Therapy

## 2022-07-14 ENCOUNTER — Other Ambulatory Visit (HOSPITAL_COMMUNITY): Payer: Self-pay

## 2022-07-14 DIAGNOSIS — R2689 Other abnormalities of gait and mobility: Secondary | ICD-10-CM

## 2022-07-14 DIAGNOSIS — M6281 Muscle weakness (generalized): Secondary | ICD-10-CM

## 2022-07-14 DIAGNOSIS — M5459 Other low back pain: Secondary | ICD-10-CM

## 2022-07-16 ENCOUNTER — Telehealth: Payer: Self-pay | Admitting: Physical Therapy

## 2022-07-16 ENCOUNTER — Ambulatory Visit: Payer: Commercial Managed Care - PPO | Admitting: Physical Therapy

## 2022-07-16 NOTE — Telephone Encounter (Signed)
Called pt re: this morning's missed appt - he answers and states he is still not feeling well, but was able to communicate with his doctor and plans to keep next appt. Confirmed date/time of next appt

## 2022-07-17 NOTE — Therapy (Signed)
OUTPATIENT PHYSICAL THERAPY TREATMENT NOTE  [name] MRN: [id number] DOB:[birth year], 57 y.o., male Today's Date: 07/21/2022    PCP: Bess Kinds, MD   REFERRING PROVIDER: Jadene Pierini, MD  END OF SESSION:   PT End of Session - 07/21/22 0843     Visit Number 20    Number of Visits 25    Date for PT Re-Evaluation 08/18/22    Authorization Type [place] [employer]    Authorization Time Period auth after 25th visit    PT Start Time 0845    PT Stop Time 0925    PT Time Calculation (min) 40 min    Activity Tolerance Patient tolerated treatment well;No increased pain    Behavior During Therapy [facility] for tasks assessed/performed                Past Medical History:  Diagnosis Date   Acute pulmonary embolus    bilateral submassive PE in setting of missing several doses of Xarelto for his DVT   Arthritis    DVT (deep venous thrombosis) 11/28/2013   RT LEG   High cholesterol    Hypertension    Lumbar spine scoliosis    Marijuana use    Sleep apnea    Past Surgical History:  Procedure Laterality Date   ABDOMINAL EXPOSURE N/A 01/14/2022   Procedure: ABDOMINAL EXPOSURE;  Surgeon: Cephus Shelling, MD;  Location: [facility] OR;  Service: Vascular;  Laterality: N/A;   ANTERIOR LUMBAR FUSION Left 01/14/2022   Procedure: Lumbar three-four Lumbar four-five Oblique Lumbar Interbody Fusion;  Surgeon: Jadene Pierini, MD;  Location: [facility] OR;  Service: Neurosurgery;  Laterality: Left;   HERNIA REPAIR     2005 and 2011; umbilical hernia repair   KNEE SURGERY Bilateral    Left knee 1993, right knee 2004   TONSILLECTOMY     TOTAL KNEE ARTHROPLASTY Left 02/10/2017   Procedure: LEFT TOTAL KNEE ARTHROPLASTY;  Surgeon: Tarry Kos, MD;  Location: [facility] OR;  Service: Orthopedics;  Laterality: Left;   TOTAL KNEE ARTHROPLASTY Right 07/28/2017   Procedure: RIGHT TOTAL KNEE ARTHROPLASTY;  Surgeon: Tarry Kos, MD;  Location: [facility] OR;  Service: Orthopedics;  Laterality:  Right;   TRANSFORAMINAL LUMBAR INTERBODY FUSION W/ MIS 1 LEVEL N/A 01/14/2022   Procedure: OPEN TRANSFORAMINAL LUMBAR INTERBODY FUSION  LUMBAR FIVE-SACRAL ONE ,LAMINECTOMIES AND POSTERIOR LATERAL INSTRUMENTATION FUSION  LUMBAR THREE TO FIVE SACRAL ONE;  Surgeon: Jadene Pierini, MD;  Location: [facility] OR;  Service: Neurosurgery;  Laterality: N/A;   Patient Active Problem List   Diagnosis Date Noted   Leg swelling 02/23/2022   Scoliosis 01/14/2022   Snoring 09/22/2021   Insomnia due to mental condition 09/22/2021   At risk for mood disturbance 09/01/2021   Sleep disturbance 07/28/2021   Chronic bilateral low back pain without sciatica 07/25/2019   Neurogenic claudication 05/19/2019   Hyperlipidemia LDL goal <70 07/20/2018   History of pulmonary embolus (PE) 07/20/2017   Mild CAD 07/20/2017   S/p total knee replacement, bilateral 02/10/2017   Chronic anticoagulation 01/26/2017   Pulmonary embolism 06/03/2016   Encounter for chronic pain management 04/06/2014   Tobacco abuse 12/05/2013   DVT (deep venous thrombosis) (HCC) 11/28/2013   HTN (hypertension) 05/04/2013   Lumbar back pain 05/04/2013   BMI 33.0-33.9,adult 05/04/2013   Hx of substance abuse 05/04/2013    REFERRING DIAG:  M54.50 (ICD-10-CM) - Lumbar back pain  THERAPY DIAG:  Other low back pain  Muscle weakness (generalized)  Other abnormalities  of gait and mobility  Rationale for Evaluation and Treatment Rehabilitation  PERTINENT HISTORY:  CAD, PE, DVT, HTN S/p lumbar interbody fusion 01/14/22  PRECAUTIONS: Back, abdominal and posterior incisions Per pt on 03/18/22, spine precautions lifted by surgeon at most recent visit     SUBJECTIVE:                                                                                                                                                                                      SUBJECTIVE STATEMENT:  07/21/2022 Pt arrives w/ 3/10 pain in low back. States he has been  increasing activity at the house and is doing well. Primary limiting issue is stiffness. Saw surgeon and has remained out of work. Follows up with them again in July.     PAIN:  Are you having pain:  3/10 Location: low back How would you describe your pain? Stiff, achey Best in past week: 3/10 with movement, no resting pain at best Worst in past week: 4/10 Aggravating factors: walking/standing household distances, prolonged sitting Easing factors: rest breaks, ice    OBJECTIVE: (objective measures completed at initial evaluation unless otherwise dated)    DIAGNOSTIC FINDINGS:  Post op interbody fusion lumbar   PATIENT SURVEYS:  FOTO 32% FOTO 03/04/22 32% FOTO 03/31/22 35% 04/29/22: 37%  05/27/22: 40% 06/23/22: 42%    COGNITION: Overall cognitive status: Within functional limits for tasks assessed                          SENSATION: Light touch intact B LEs     POSTURE: increased kyphosis and forward head posture, UT elevation   PALPATION/OBSERVATION: Deferred as pt wears back brace to eval - states he saw MD this morning and no concerns; abdominal incision healed and posterior incision with mild drainage but WNL per pt report   LUMBAR ROM:    AROM 03/18/22  Flexion  75% distal to knee, mild pain  Extension  25% p!  Right lateral flexion    Left lateral flexion    Right rotation 50% stiffness  Left rotation  50% stiffness   (Blank rows = not tested) Comments: Deferred on eval given spine precautions 04/29/22: Deferred given time constraints and pt arriving with back brace donned     LOWER EXTREMITY MMT:     MMT Right eval Left eval Right 03/31/22 Left 03/31/22 R/L 04/29/22  Hip flexion 4p! 4p! 4+ P 4+ 5/5 (stiffness R side)  Hip abduction (modified sitting) 4+ 4+ 4+ 4+ 4+/4+  Hip internal rotation         Hip external rotation     4-  4 4+/4+  Knee flexion 5 4+ 5 5   Knee extension 4+ 4+ 5 5    (Blank rows = not tested)   Comments: mild pulling in hamstring  with knee ext on R, no pain with resistance; mild pain with hip flex B that resolves with cessation of movement       FUNCTIONAL TESTS:  Sit to stand transfer, standard chair: heavy use of UE on rollator and chair, reduced fwd weight shift, wide BOS, significantly inc time and effort   TUG: 22 seconds from standard chair w/ rollator, gait mechanics as below, heavy UE use  02/16/22: TUG 12sec standard chair with rollator, continues with impaired gait mechanics and heavy UE use, fwd flexed posture  2 minute walk test: 03/31/22: 295 Feet  04/29/22: 417ft w/ rollator  335ft gait no AD, RPE 5/10, limited by fatigue, SBA 05/11/22: Tandem stance 30 sec   06/23/22:  BERG 46/56 (see below) no AD, SBA 459ft 8/10 RPE reported (HR up to 118bpm, SpO2 98%. Recovers to 89bpm sitting)        BERG BALANCE: 43/56 (04/23/22)    06/23/22 BERG Sitting to Standing: Numbers; 0-4: 3  4. Stands without using hands and stabilize independently  3. Stands independently using hands  2. Stands using hands after multiple trials  1. Min A to stand  0. Mod-Max A to stand Standing unsupported: Numbers; 0-4: 4  4. Stands safely for 2 minutes  3. Stands 2 minutes with supervision  2. Stands 30 seconds unsupported  1. Needs several tries to stand unsupported for 30 seconds  0. Unable to stand unsupported for 30 seconds Sitting unsupported: Numbers; 0-4: 4  4. Sits for 2 minutes independently  3. Sits for 2 minutes with supervision  2. Able to sit 30 seconds  1. Able to sit 10 seconds  0. Unable to sit for 10 seconds Standing to Sitting: Numbers; 0-4: 3 4. Sits safely with minimal use of hands 3. Controls descent with hands 2. Uses back of legs against chair to control descent 1. Sits independently, but uncontrolled descent 0. Needs assistance Transfers: Numbers; 0-4: 3  4. Transfers safely with minor use of hands  3. Transfers safely definite use of hands  2. Transfers with verbal  cueing/supervision  1. Needs 1 person assist  0. Needs 2 person assist  Standing with eyes closed: Numbers; 0-4: 4  4. Stands safely for 10 seconds  3. Stands 10 seconds with supervision   2. Able to stand for 3 seconds  1. Unable to keep eyes closed for 3 seconds, but is safe  0. Needs assist to keep from falling Standing with feet together: Numbers; 0-4: 4 4. Stands for 1 minute safely 3. Stands for 1 minute with supervision 2. Unable to hold for 30 seconds  1. Needs help to attain position but can hold for 15 seconds  0. Needs help to attain position and unable to hold for 15 seconds Reaching forward with outstretched arm: Numbers; 0-4: 4  4. Reaches forward 10 inches  3. Reaches forward 5 inches  2. Reaches forward 2 inches  1. Reaches forward with supervision  0. Loses balance/requires assistace Retrieving object from the floor: Numbers; 0-4: 1 4. Able to pick up easily and safely 3. Able to pick up with supervision 2. Unable to pick up, but reaches within 1-2 inches independently 1. Unable to pick up and needs supervision 0. Unable/needs assistance to keep from falling  Turning to look behind:  Numbers; 0-4: 4  4. Looks behind from both sides and weight shifts well  3. Looks behind one side only, other side less weight shift  2. Turns sideways only, maintains balance  1. Needs supervision when turning  0. Needs assistance  Turning 360 degrees: Numbers; 0-4: 2  4. Able to turn in </=4 seconds  3. Able to turn on one side in </= 4 seconds   2. Able to turn slowly, but safely  1. Needs supervision or verbal cueing  0. Needs assistance Place alternate foot on stool: Numbers; 0-4: 4 4. Completes 8 steps in 20 seconds 3. Completes 8 steps in >20 seconds 2. 4 steps without assistance/supervision 1. Completes >2 steps with minimal assist 0. Unable, needs assist to keep from falling Standing with one foot in front: Numbers; 0-4: 3  4. Independent tandem for 30 seconds  3.  Independent foot ahead for 30 seconds  2. Independent small step for 30 seconds  1. Needs help to step, but can hold for 15 seconds  0. Loses balance while standing/stepping Standing on one foot: Numbers; 0-4: 3 4. Holds >10 seconds 3. Holds 5-10 seconds 2. Holds >/=3 seconds  1. Holds <3 seconds 0. Unable   Total Score: 46/56      GAIT: Distance walked: within clinic Assistive device utilized: rollator Level of assistance: Modified independence Comments: significant forward flexed posture, partial step through pattern with intermittent step to, reduced hip extension B, reduced knee ROM throughout swing/stance    TODAY'S TREATMENT:  [facility] Adult PT Treatment:                                                DATE: 07/20/22 Therapeutic Exercise: Nu step during subjective Supine for anterior hip stretch 3x45 sec cues for breath control and alignment Supine physio ball hamstring curls/DKTC 2x10 cues for pacing and comfortable ROM Standing hip flexor stretches 2x8 BIL  HEP update + handout, education on supine positioning to maximize tolerance to neutral as pt tends to remain flexed   [facility] Adult PT Treatment:                                                DATE: 07/14/22 No charge visit, see subjective/assessment  [facility] Adult PT Treatment:                                                DATE: 06/25/22 No charge visit, see subjective/assessment  [facility] Adult PT Treatment:                                                DATE: 06/23/22 Therapeutic Activity: FOTO + education MSK assessment + education BERG + education + education Education re: goals and PT POC, relation to functional tasks and daily activities   [facility] Adult PT Treatment:  DATE: 06/19/22 Therapeutic Exercise: Nu step L5 LE/UE during subjective 5 min  CC paloff press 13# 2x10 BIL cues for posture and pacing CC unilat row 27# 2x10 cues for posture Hip IR AAROM B,  seated, x8 cues for pacing and full ROM  Seated med ball fwd press 3kg 2x12 cues for posture and velocity  Seated truncal rotation AROM 2x5 BIL cues for comfortable ROM, breath control GTB seated abd 2x15  HEP update + handout   PATIENT EDUCATION:  Education details: rationale for interventions Person educated: Patient Education method: Explanation, Demonstration, Tactile cues, Verbal cues Education comprehension: verbalized understanding, returned demonstration, verbal cues required, tactile cues required, and needs further education    HOME EXERCISE PROGRAM: Access Code: [email or web address] URL: [email or web address] Date: 07/21/2022 Prepared by: Fransisco Hertz  Program Notes - lying on back trying to get hips to neutral, 3 rounds of 30sec per day  Exercises - Sit to Stand with Armchair  - 1 x daily - 7 x weekly - 2 sets - 10 reps - Heel Raises with Counter Support  - 1 x daily - 7 x weekly - 2 sets - 10 reps - Standing Row with Anchored Resistance  - 1 x daily - 7 x weekly - 2-3 sets - 10 reps - 5 hold - Shoulder extension with resistance - Neutral  - 1 x daily - 7 x weekly - 2-3 sets - 10 reps - 5 hold - Standing Anti-Rotation Press with Anchored Resistance  - 1 x daily - 7 x weekly - 1-2 sets - 10 reps - 3-5 hold - Seated Hip Adduction Isometrics with Ball  - 1 x daily - 7 x weekly - 3 sets - 10 reps - Seated Trunk Rotation - Arms Crossed  - 1 x daily - 7 x weekly - 3 sets - 5 reps   ASSESSMENT:   CLINICAL IMPRESSION: Pt arrives w/ 3/10 pain after gap in care due to stomach illness, continues to progress activity at home. Endorses most difficulty with bending, straightening, and general mobility which he states is limited by stiffness more than pain. Today focusing on improving tissue extensibility as above, cues for comfortable ROM and pacing. Pt endorses muscle fatigue/soreness but no overt increases in pain as session goes on. Discussed supine positioning at home for  improved extension tolerance as he demos tendency to remain flexed - with stretching as above he demos gradual reduction in pain and improved hip positioning. No adverse events, HEP updated and education as above. Departs without change in pain, reports improved stiffness. Pt departs today's session in no acute distress, all voiced questions/concerns addressed appropriately from PT perspective.     OBJECTIVE IMPAIRMENTS: Abnormal gait, decreased activity tolerance, decreased balance, decreased endurance, decreased mobility, difficulty walking, decreased ROM, decreased strength, hypomobility, improper body mechanics, postural dysfunction, and pain.    ACTIVITY LIMITATIONS: carrying, lifting, bending, sitting, standing, squatting, sleeping, stairs, transfers, bed mobility, bathing, toileting, dressing, and locomotion level   PARTICIPATION LIMITATIONS: meal prep, cleaning, laundry, shopping, community activity, and yard work   PERSONAL FACTORS: Time since onset of injury/illness/exacerbation and 3+ comorbidities: CAD, PE, DVT, HTN  are also affecting patient's functional outcome.    REHAB POTENTIAL: Good   CLINICAL DECISION MAKING: Stable/uncomplicated   EVALUATION COMPLEXITY: Low     GOALS: Goals reviewed with patient? Yes   SHORT TERM GOALS: Target date: 02/25/2022   Pt will demonstrate appropriate understanding and performance of initially prescribed HEP in order to facilitate improved independence with management of  symptoms.  Baseline: HEP provided on eval 03/04/22: Pt reports independence w/ HEP Goal status: MET   2. Pt will score greater than or equal to 42% on FOTO in order to demonstrate improved perception of function due to symptoms.            Baseline: 32% 03/04/22: 32% 03/31/22: 35% 04/29/22: 37%  06/23/22: 42%             Goal status:  MET   LONG TERM GOALS: Target date: 08/18/2022   (updated 07/21/22 given gap in care)              Pt will score 51% on FOTO in order to  demonstrate improved perception of functional status due to symptoms.  Baseline: 32% 03/31/22: 35% 04/29/22: 37%  06/23/22: 42%  Goal status: PROGRESSING   2.  Pt will demonstrate ability to ambulate at least 1076ft with LRAD and no more than 2pt increase in baseline pain on NPS in order to facilitate improved tolerance to community ambulation.  Baseline: pt unable to tolerate greater than household distances due to significant inc in pain 03/31/22: able to complete 2 minute walk test at 295 feet with increased pain from 5/10 to 7/10.  04/29/22: 452ft w/ rollator no increase in pain , significant fatigue; 383ft no AD limited by fatigue    06/23/22: 45ft no AD limited by fatigue, increase in pain reported  Goal status: PROGRESSING   3.  Pt will demonstrate hip flexion MMT of 5/5 in order to facilitate improved clearance with gait for reduced fall risk.  Baseline: 4/5 B w pain 03/31/21: see chart 04/29/22: see chart Goal status: MET   4. Pt will be able to perform TUG in less than or equal to 13 sec with LRAD in order to indicate reduced risk of falling (cutoff score for fall risk 13.5 sec in community dwelling older adults per [employer] et al, 2000)            Baseline: 22sec with rollator  02/16/22: 12sec with rollator             Goal status: MET   5. Pt will score >47/56 on BERG to indicate reduced fall risk and increased safety w/ mobility.   04/23/22: 43/56  06/23/22: 46/56   Goal status: NEARLY MET   PLAN (extended 07/21/22 given gap in care):   PT FREQUENCY: 2x/week   PT DURATION: 3 weeks   PLANNED INTERVENTIONS: Therapeutic exercises, Therapeutic activity, Neuromuscular re-education, Balance training, Gait training, Patient/Family education, Self Care, Stair training, DME instructions, Aquatic Therapy, Cryotherapy, Moist heat, Taping, Manual therapy, and Re-evaluation.   PLAN FOR NEXT SESSION:   Review/update HEP (hip mobility). Gait/functional mobility     Ashley Murrain PT, DPT 07/21/2022 9:32 AM

## 2022-07-21 ENCOUNTER — Ambulatory Visit: Payer: Commercial Managed Care - PPO | Admitting: Physical Therapy

## 2022-07-21 ENCOUNTER — Encounter: Payer: Self-pay | Admitting: Physical Therapy

## 2022-07-21 DIAGNOSIS — R2689 Other abnormalities of gait and mobility: Secondary | ICD-10-CM

## 2022-07-21 DIAGNOSIS — M6281 Muscle weakness (generalized): Secondary | ICD-10-CM

## 2022-07-21 DIAGNOSIS — M5459 Other low back pain: Secondary | ICD-10-CM

## 2022-07-23 ENCOUNTER — Ambulatory Visit: Payer: Commercial Managed Care - PPO | Admitting: Physical Therapy

## 2022-07-23 ENCOUNTER — Telehealth: Payer: Self-pay | Admitting: Physical Therapy

## 2022-07-23 NOTE — Telephone Encounter (Signed)
Called pt re: this morning's missed appt - left voicemail with reminder of attendance policy, confirmed date/time of next appt and provided office call back number if needs to reschedule.

## 2022-07-28 ENCOUNTER — Telehealth: Payer: Self-pay | Admitting: Physical Therapy

## 2022-07-28 ENCOUNTER — Ambulatory Visit: Payer: Commercial Managed Care - PPO | Admitting: Physical Therapy

## 2022-07-28 NOTE — Telephone Encounter (Signed)
Called pt re: this morning's missed appt - he states he is doing well but is having issues with his car. Confirms date/time next appt, also advised that given multiple no shows we will be cancelling additional appts and will only be able to schedule one visit at a time going forward. He verbalizes understanding

## 2022-07-29 NOTE — Therapy (Signed)
OUTPATIENT PHYSICAL THERAPY TREATMENT NOTE  Patient Name: EVIE CRUMPLER MRN: [id number] DOB:[birth year], 57 y.o., male Today's Date: 07/30/2022    PCP: Bess Kinds, MD   REFERRING PROVIDER: Jadene Pierini, MD  END OF SESSION:   PT End of Session - 07/30/22 0850     Visit Number 21    Number of Visits 25    Date for PT Re-Evaluation 08/18/22    Authorization Type [place] [employer]    Authorization Time Period auth after 25th visit    PT Start Time 0850   late check in   PT Stop Time 0905    PT Time Calculation (min) 15 min    Activity Tolerance --    Behavior During Therapy [facility] for tasks assessed/performed                 Past Medical History:  Diagnosis Date   Acute pulmonary embolus (HCC)    bilateral submassive PE in setting of missing several doses of Xarelto for his DVT   Arthritis    DVT (deep venous thrombosis) (HCC) 11/28/2013   RT LEG   High cholesterol    Hypertension    Lumbar spine scoliosis    Marijuana use    Sleep apnea    Past Surgical History:  Procedure Laterality Date   ABDOMINAL EXPOSURE N/A 01/14/2022   Procedure: ABDOMINAL EXPOSURE;  Surgeon: Cephus Shelling, MD;  Location: [facility] OR;  Service: Vascular;  Laterality: N/A;   ANTERIOR LUMBAR FUSION Left 01/14/2022   Procedure: Lumbar three-four Lumbar four-five Oblique Lumbar Interbody Fusion;  Surgeon: Jadene Pierini, MD;  Location: [facility] OR;  Service: Neurosurgery;  Laterality: Left;   HERNIA REPAIR     2005 and 2011; umbilical hernia repair   KNEE SURGERY Bilateral    Left knee 1993, right knee 2004   TONSILLECTOMY     TOTAL KNEE ARTHROPLASTY Left 02/10/2017   Procedure: LEFT TOTAL KNEE ARTHROPLASTY;  Surgeon: Tarry Kos, MD;  Location: [facility] OR;  Service: Orthopedics;  Laterality: Left;   TOTAL KNEE ARTHROPLASTY Right 07/28/2017   Procedure: RIGHT TOTAL KNEE ARTHROPLASTY;  Surgeon: Tarry Kos, MD;  Location: [facility] OR;  Service: Orthopedics;  Laterality: Right;   TRANSFORAMINAL  LUMBAR INTERBODY FUSION W/ MIS 1 LEVEL N/A 01/14/2022   Procedure: OPEN TRANSFORAMINAL LUMBAR INTERBODY FUSION  LUMBAR FIVE-SACRAL ONE ,LAMINECTOMIES AND POSTERIOR LATERAL INSTRUMENTATION FUSION  LUMBAR THREE TO FIVE SACRAL ONE;  Surgeon: Jadene Pierini, MD;  Location: [facility] OR;  Service: Neurosurgery;  Laterality: N/A;   Patient Active Problem List   Diagnosis Date Noted   Leg swelling 02/23/2022   Scoliosis 01/14/2022   Snoring 09/22/2021   Insomnia due to mental condition 09/22/2021   At risk for mood disturbance 09/01/2021   Sleep disturbance 07/28/2021   Chronic bilateral low back pain without sciatica 07/25/2019   Neurogenic claudication 05/19/2019   Hyperlipidemia LDL goal <70 07/20/2018   History of pulmonary embolus (PE) 07/20/2017   Mild CAD 07/20/2017   S/p total knee replacement, bilateral 02/10/2017   Chronic anticoagulation 01/26/2017   Pulmonary embolism (HCC) 06/03/2016   Encounter for chronic pain management 04/06/2014   Tobacco abuse 12/05/2013   DVT (deep venous thrombosis) (HCC) 11/28/2013   HTN (hypertension) 05/04/2013   Lumbar back pain 05/04/2013   BMI 33.0-33.9,adult 05/04/2013   Hx of substance abuse (HCC) 05/04/2013    REFERRING DIAG:  M54.50 (ICD-10-CM) - Lumbar back pain  THERAPY DIAG:  Other low back pain  Muscle weakness (  generalized)  Other abnormalities of gait and mobility  Rationale for Evaluation and Treatment Rehabilitation  PERTINENT HISTORY:  CAD, PE, DVT, HTN S/p lumbar interbody fusion 01/14/22  PRECAUTIONS: Back, abdominal and posterior incisions Per pt on 03/18/22, spine precautions lifted by surgeon at most recent visit     SUBJECTIVE:                                                                                                                                                                                      SUBJECTIVE STATEMENT:  07/30/2022 Pt arrives with 4/10 low back pain. Prior to initiation of treatment pt  mentions he is concerned about reoccurrence of DVT in RLE given aching in limb that is similar to his previous DVT. See assessment below, full treatment deferred    PAIN:  Are you having pain:  4/10 Location: low back How would you describe your pain? Stiff, achey Best in past week: 3/10 with movement, no resting pain at best Worst in past week: 4/10 Aggravating factors: walking/standing household distances, prolonged sitting Easing factors: rest breaks, ice    OBJECTIVE: (objective measures completed at initial evaluation unless otherwise dated)    DIAGNOSTIC FINDINGS:  Post op interbody fusion lumbar   PATIENT SURVEYS:  FOTO 32% FOTO 03/04/22 32% FOTO 03/31/22 35% 04/29/22: 37%  05/27/22: 40% 06/23/22: 42%    COGNITION: Overall cognitive status: Within functional limits for tasks assessed                          SENSATION: Light touch intact B LEs     POSTURE: increased kyphosis and forward head posture, UT elevation   PALPATION/OBSERVATION: Deferred as pt wears back brace to eval - states he saw MD this morning and no concerns; abdominal incision healed and posterior incision with mild drainage but WNL per pt report   LUMBAR ROM:    AROM 03/18/22  Flexion  75% distal to knee, mild pain  Extension  25% p!  Right lateral flexion    Left lateral flexion    Right rotation 50% stiffness  Left rotation  50% stiffness   (Blank rows = not tested) Comments: Deferred on eval given spine precautions 04/29/22: Deferred given time constraints and pt arriving with back brace donned     LOWER EXTREMITY MMT:     MMT Right eval Left eval Right 03/31/22 Left 03/31/22 R/L 04/29/22  Hip flexion 4p! 4p! 4+ P 4+ 5/5 (stiffness R side)  Hip abduction (modified sitting) 4+ 4+ 4+ 4+ 4+/4+  Hip internal rotation         Hip external rotation  4- 4 4+/4+  Knee flexion 5 4+ 5 5   Knee extension 4+ 4+ 5 5    (Blank rows = not tested)   Comments: mild pulling in hamstring with knee  ext on R, no pain with resistance; mild pain with hip flex B that resolves with cessation of movement       FUNCTIONAL TESTS:  Sit to stand transfer, standard chair: heavy use of UE on rollator and chair, reduced fwd weight shift, wide BOS, significantly inc time and effort   TUG: 22 seconds from standard chair w/ rollator, gait mechanics as below, heavy UE use  02/16/22: TUG 12sec standard chair with rollator, continues with impaired gait mechanics and heavy UE use, fwd flexed posture  2 minute walk test: 03/31/22: 295 Feet  04/29/22: 459ft w/ rollator  345ft gait no AD, RPE 5/10, limited by fatigue, SBA 05/11/22: Tandem stance 30 sec   06/23/22:  BERG 46/56 (see below) no AD, SBA 439ft 8/10 RPE reported (HR up to 118bpm, SpO2 98%. Recovers to 89bpm sitting)        BERG BALANCE: 43/56 (04/23/22)    06/23/22 BERG Sitting to Standing: Numbers; 0-4: 3  4. Stands without using hands and stabilize independently  3. Stands independently using hands  2. Stands using hands after multiple trials  1. Min A to stand  0. Mod-Max A to stand Standing unsupported: Numbers; 0-4: 4  4. Stands safely for 2 minutes  3. Stands 2 minutes with supervision  2. Stands 30 seconds unsupported  1. Needs several tries to stand unsupported for 30 seconds  0. Unable to stand unsupported for 30 seconds Sitting unsupported: Numbers; 0-4: 4  4. Sits for 2 minutes independently  3. Sits for 2 minutes with supervision  2. Able to sit 30 seconds  1. Able to sit 10 seconds  0. Unable to sit for 10 seconds Standing to Sitting: Numbers; 0-4: 3 4. Sits safely with minimal use of hands 3. Controls descent with hands 2. Uses back of legs against chair to control descent 1. Sits independently, but uncontrolled descent 0. Needs assistance Transfers: Numbers; 0-4: 3  4. Transfers safely with minor use of hands  3. Transfers safely definite use of hands  2. Transfers with verbal cueing/supervision  1.  Needs 1 person assist  0. Needs 2 person assist  Standing with eyes closed: Numbers; 0-4: 4  4. Stands safely for 10 seconds  3. Stands 10 seconds with supervision   2. Able to stand for 3 seconds  1. Unable to keep eyes closed for 3 seconds, but is safe  0. Needs assist to keep from falling Standing with feet together: Numbers; 0-4: 4 4. Stands for 1 minute safely 3. Stands for 1 minute with supervision 2. Unable to hold for 30 seconds  1. Needs help to attain position but can hold for 15 seconds  0. Needs help to attain position and unable to hold for 15 seconds Reaching forward with outstretched arm: Numbers; 0-4: 4  4. Reaches forward 10 inches  3. Reaches forward 5 inches  2. Reaches forward 2 inches  1. Reaches forward with supervision  0. Loses balance/requires assistace Retrieving object from the floor: Numbers; 0-4: 1 4. Able to pick up easily and safely 3. Able to pick up with supervision 2. Unable to pick up, but reaches within 1-2 inches independently 1. Unable to pick up and needs supervision 0. Unable/needs assistance to keep from falling  Turning to look  behind: Numbers; 0-4: 4  4. Looks behind from both sides and weight shifts well  3. Looks behind one side only, other side less weight shift  2. Turns sideways only, maintains balance  1. Needs supervision when turning  0. Needs assistance  Turning 360 degrees: Numbers; 0-4: 2  4. Able to turn in </=4 seconds  3. Able to turn on one side in </= 4 seconds   2. Able to turn slowly, but safely  1. Needs supervision or verbal cueing  0. Needs assistance Place alternate foot on stool: Numbers; 0-4: 4 4. Completes 8 steps in 20 seconds 3. Completes 8 steps in >20 seconds 2. 4 steps without assistance/supervision 1. Completes >2 steps with minimal assist 0. Unable, needs assist to keep from falling Standing with one foot in front: Numbers; 0-4: 3  4. Independent tandem for 30 seconds  3. Independent foot ahead  for 30 seconds  2. Independent small step for 30 seconds  1. Needs help to step, but can hold for 15 seconds  0. Loses balance while standing/stepping Standing on one foot: Numbers; 0-4: 3 4. Holds >10 seconds 3. Holds 5-10 seconds 2. Holds >/=3 seconds  1. Holds <3 seconds 0. Unable   Total Score: 46/56      GAIT: Distance walked: within clinic Assistive device utilized: rollator Level of assistance: Modified independence Comments: significant forward flexed posture, partial step through pattern with intermittent step to, reduced hip extension B, reduced knee ROM throughout swing/stance    TODAY'S TREATMENT:  [facility] Adult PT Treatment:                                                DATE: 07/30/22 Therapeutic Activity: Examination, education on relevant anatomy/physiology, rationale for deferring treatment today, appropriate action given concern with calf pain/swelling, safety with activity Further treatment deferred given concern w/ RLE swelling/exam findings   [facility] Adult PT Treatment:                                                DATE: 07/20/22 Therapeutic Exercise: Nu step during subjective Supine for anterior hip stretch 3x45 sec cues for breath control and alignment Supine physio ball hamstring curls/DKTC 2x10 cues for pacing and comfortable ROM Standing hip flexor stretches 2x8 BIL  HEP update + handout, education on supine positioning to maximize tolerance to neutral as pt tends to remain flexed   [facility] Adult PT Treatment:                                                DATE: 07/14/22 No charge visit, see subjective/assessment  [facility] Adult PT Treatment:                                                DATE: 06/25/22 No charge visit, see subjective/assessment  [facility] Adult PT Treatment:  DATE: 06/23/22 Therapeutic Activity: FOTO + education MSK assessment + education BERG + education + education Education re: goals and  PT POC, relation to functional tasks and daily activities   PATIENT EDUCATION:  Education details: rationale for deferring treatment on this date, concerning symptoms, appropriate action, safety w/ activity Person educated: Patient Education method: Explanation, Demonstration, Tactile cues, Verbal cues Education comprehension: verbalized understanding, returned demonstration, verbal cues required, tactile cues required, and needs further education    HOME EXERCISE PROGRAM: Access Code: [email or web address] URL: [email or web address] Date: 07/21/2022 Prepared by: Fransisco Hertz  Program Notes - lying on back trying to get hips to neutral, 3 rounds of 30sec per day  Exercises - Sit to Stand with Armchair  - 1 x daily - 7 x weekly - 2 sets - 10 reps - Heel Raises with Counter Support  - 1 x daily - 7 x weekly - 2 sets - 10 reps - Standing Row with Anchored Resistance  - 1 x daily - 7 x weekly - 2-3 sets - 10 reps - 5 hold - Shoulder extension with resistance - Neutral  - 1 x daily - 7 x weekly - 2-3 sets - 10 reps - 5 hold - Standing Anti-Rotation Press with Anchored Resistance  - 1 x daily - 7 x weekly - 1-2 sets - 10 reps - 3-5 hold - Seated Hip Adduction Isometrics with Ball  - 1 x daily - 7 x weekly - 3 sets - 10 reps - Seated Trunk Rotation - Arms Crossed  - 1 x daily - 7 x weekly - 3 sets - 5 reps   ASSESSMENT:   CLINICAL IMPRESSION: Pt arrives w/ 4/10 pain on NPS in low back, prior to initiation of treatment pt endorses concern over possibility of new DVT over the past week (see subjective). States he has been diligent with his medication regimen but this feels similar to prior episode of DVT, aching in calf. On exam, pt has 1.5cm circumferential difference at tibial tuberosity greater on R than L (where edema appears most prominent). Concordant pain with passive dorsiflexion, noted distention of proximal/posterior superficial veins that is tender to touch. Also tender to touch  throughout medial/posterior calf. No apparent erythema, pt denies any other red flag symptoms (SOB, chest pain, etc). Given these findings and pt history of DVT/PE, deferred further treatment and advised pt to present to UC/ED for evaluation. Pt verbalizes understanding and agrees with deferral of treatment on this date. No adverse events, departs clinic in no acute distress.     OBJECTIVE IMPAIRMENTS: Abnormal gait, decreased activity tolerance, decreased balance, decreased endurance, decreased mobility, difficulty walking, decreased ROM, decreased strength, hypomobility, improper body mechanics, postural dysfunction, and pain.    ACTIVITY LIMITATIONS: carrying, lifting, bending, sitting, standing, squatting, sleeping, stairs, transfers, bed mobility, bathing, toileting, dressing, and locomotion level   PARTICIPATION LIMITATIONS: meal prep, cleaning, laundry, shopping, community activity, and yard work   PERSONAL FACTORS: Time since onset of injury/illness/exacerbation and 3+ comorbidities: CAD, PE, DVT, HTN  are also affecting patient's functional outcome.    REHAB POTENTIAL: Good   CLINICAL DECISION MAKING: Stable/uncomplicated   EVALUATION COMPLEXITY: Low     GOALS: Goals reviewed with patient? Yes   SHORT TERM GOALS: Target date: 02/25/2022   Pt will demonstrate appropriate understanding and performance of initially prescribed HEP in order to facilitate improved independence with management of symptoms.  Baseline: HEP provided on eval 03/04/22: Pt reports independence w/ HEP Goal status: MET   2. Pt  will score greater than or equal to 42% on FOTO in order to demonstrate improved perception of function due to symptoms.            Baseline: 32% 03/04/22: 32% 03/31/22: 35% 04/29/22: 37%  06/23/22: 42%             Goal status:  MET   LONG TERM GOALS: Target date: 08/18/2022   (updated 07/21/22 given gap in care)              Pt will score 51% on FOTO in order to demonstrate improved  perception of functional status due to symptoms.  Baseline: 32% 03/31/22: 35% 04/29/22: 37%  06/23/22: 42%  Goal status: PROGRESSING   2.  Pt will demonstrate ability to ambulate at least 1053ft with LRAD and no more than 2pt increase in baseline pain on NPS in order to facilitate improved tolerance to community ambulation.  Baseline: pt unable to tolerate greater than household distances due to significant inc in pain 03/31/22: able to complete 2 minute walk test at 295 feet with increased pain from 5/10 to 7/10.  04/29/22: 456ft w/ rollator no increase in pain , significant fatigue; 352ft no AD limited by fatigue    06/23/22: 450ft no AD limited by fatigue, increase in pain reported  Goal status: PROGRESSING   3.  Pt will demonstrate hip flexion MMT of 5/5 in order to facilitate improved clearance with gait for reduced fall risk.  Baseline: 4/5 B w pain 03/31/21: see chart 04/29/22: see chart Goal status: MET   4. Pt will be able to perform TUG in less than or equal to 13 sec with LRAD in order to indicate reduced risk of falling (cutoff score for fall risk 13.5 sec in community dwelling older adults per [facility] et al, 2000)            Baseline: 22sec with rollator  02/16/22: 12sec with rollator             Goal status: MET   5. Pt will score >47/56 on BERG to indicate reduced fall risk and increased safety w/ mobility.   04/23/22: 43/56  06/23/22: 46/56   Goal status: NEARLY MET   PLAN (extended 07/21/22 given gap in care):   PT FREQUENCY: 2x/week   PT DURATION: 3 weeks   PLANNED INTERVENTIONS: Therapeutic exercises, Therapeutic activity, Neuromuscular re-education, Balance training, Gait training, Patient/Family education, Self Care, Stair training, DME instructions, Aquatic Therapy, Cryotherapy, Moist heat, Taping, Manual therapy, and Re-evaluation.   PLAN FOR NEXT SESSION:   Review/update HEP (hip mobility). Gait/functional mobility  as appropriate, check in after LE  swelling work up   Ashley Murrain PT, DPT 07/30/2022 9:25 AM

## 2022-07-30 ENCOUNTER — Ambulatory Visit: Payer: Commercial Managed Care - PPO | Attending: Cardiology | Admitting: Physical Therapy

## 2022-07-30 ENCOUNTER — Telehealth: Payer: Self-pay

## 2022-07-30 ENCOUNTER — Ambulatory Visit: Payer: Commercial Managed Care - PPO | Admitting: Family Medicine

## 2022-07-30 ENCOUNTER — Encounter: Payer: Self-pay | Admitting: Physical Therapy

## 2022-07-30 DIAGNOSIS — M6281 Muscle weakness (generalized): Secondary | ICD-10-CM

## 2022-07-30 DIAGNOSIS — R2689 Other abnormalities of gait and mobility: Secondary | ICD-10-CM

## 2022-07-30 DIAGNOSIS — M5459 Other low back pain: Secondary | ICD-10-CM | POA: Diagnosis not present

## 2022-07-30 NOTE — Telephone Encounter (Signed)
Patient calls nurse line again.   He reports he can not make this mornings apt due to a scheduling conflict.   Patient advised to go to [facility] or ED.   Patient agreed with plan.

## 2022-07-30 NOTE — Telephone Encounter (Signed)
Patient calls nurse line requesting an apt.   Patient reports swelling in right LE for a "couple" of days. He reports the area is very tender. He reports he has a hx of blood clots. He is on Eliquis, however reports he missed a few doses last week.  He was advised by PT this morning to call his PCP for an apt.   He denies any chest pains, rapid breathing or SOB.   Patient scheduled for this morning for evaluation.   ED precautions given in the meantime.

## 2022-08-02 ENCOUNTER — Encounter (HOSPITAL_COMMUNITY): Payer: Self-pay | Admitting: Emergency Medicine

## 2022-08-02 ENCOUNTER — Emergency Department (HOSPITAL_COMMUNITY): Payer: Commercial Managed Care - PPO

## 2022-08-02 ENCOUNTER — Other Ambulatory Visit: Payer: Self-pay

## 2022-08-02 ENCOUNTER — Emergency Department (HOSPITAL_COMMUNITY)
Admission: EM | Admit: 2022-08-02 | Discharge: 2022-08-02 | Disposition: A | Discharge status: Home / Self Care | Payer: Commercial Managed Care - PPO | Attending: Emergency Medicine | Admitting: Emergency Medicine

## 2022-08-02 DIAGNOSIS — R0602 Shortness of breath: Secondary | ICD-10-CM | POA: Diagnosis not present

## 2022-08-02 DIAGNOSIS — R079 Chest pain, unspecified: Secondary | ICD-10-CM | POA: Diagnosis not present

## 2022-08-02 DIAGNOSIS — M79661 Pain in right lower leg: Secondary | ICD-10-CM | POA: Insufficient documentation

## 2022-08-02 DIAGNOSIS — R0789 Other chest pain: Secondary | ICD-10-CM | POA: Insufficient documentation

## 2022-08-02 DIAGNOSIS — J9811 Atelectasis: Secondary | ICD-10-CM | POA: Diagnosis not present

## 2022-08-02 DIAGNOSIS — R000 Tachycardia, unspecified: Secondary | ICD-10-CM | POA: Diagnosis not present

## 2022-08-02 DIAGNOSIS — Z7901 Long term (current) use of anticoagulants: Secondary | ICD-10-CM | POA: Diagnosis not present

## 2022-08-02 LAB — CBC
HCT: 41.3 % (ref 39.0–52.0)
Hemoglobin: 14.1 g/dL (ref 13.0–17.0)
MCH: 31.8 pg (ref 26.0–34.0)
MCHC: 34.1 g/dL (ref 30.0–36.0)
MCV: 93.2 fL (ref 80.0–100.0)
Platelets: 142 10*3/uL — ABNORMAL LOW (ref 150–400)
RBC: 4.43 MIL/uL (ref 4.22–5.81)
RDW: 14 % (ref 11.5–15.5)
WBC: 8.3 10*3/uL (ref 4.0–10.5)
nRBC: 0 % (ref 0.0–0.2)

## 2022-08-02 LAB — BASIC METABOLIC PANEL
Anion gap: 15 (ref 5–15)
BUN: 6 mg/dL (ref 6–20)
CO2: 23 mmol/L (ref 22–32)
Calcium: 8.7 mg/dL — ABNORMAL LOW (ref 8.9–10.3)
Chloride: 95 mmol/L — ABNORMAL LOW (ref 98–111)
Creatinine, Ser: 0.75 mg/dL (ref 0.61–1.24)
GFR, Estimated: >60 mL/min (ref >60)
Glucose, Bld: 88 mg/dL (ref 70–99)
Potassium: 3.5 mmol/L (ref 3.5–5.1)
Sodium: 133 mmol/L — ABNORMAL LOW (ref 135–145)

## 2022-08-02 LAB — TROPONIN I (HIGH SENSITIVITY)
Troponin I (High Sensitivity): 6 ng/L (ref <18)
Troponin I (High Sensitivity): 5 ng/L (ref <18)

## 2022-08-02 MED ORDER — IOHEXOL 350 MG/ML SOLN
75.0000 mL | Freq: Once | INTRAVENOUS | Status: AC | PRN
  Administered 2022-08-02: 75 mL via INTRAVENOUS

## 2022-08-02 MED ORDER — MORPHINE SULFATE (PF) 4 MG/ML IV SOLN
4.0000 mg | Freq: Once | INTRAVENOUS | Status: AC
  Administered 2022-08-02: 4 mg via INTRAVENOUS
  Filled 2022-08-02: qty 1

## 2022-08-02 NOTE — Discharge Instructions (Signed)
Return for the ultrasound of your leg.  If positive will have you check back into the emergency department  Follow-up with primary care provider

## 2022-08-02 NOTE — ED Notes (Signed)
Rounded on patient, patient not brought back to room yet.

## 2022-08-02 NOTE — ED Notes (Signed)
MD Silverio Lay aware of patient's high BP.

## 2022-08-02 NOTE — ED Triage Notes (Signed)
Pt here from home with c/o chest pain  and sob that started approx 2 days ago , some slight nausea , pt has hx of PE and has missed a couple doses of his blood thinners

## 2022-08-02 NOTE — ED Provider Notes (Signed)
[place] EMERGENCY DEPARTMENT AT [facility] Provider Note   CSN: [id number] Arrival date & time: 08/02/22  1753     History  Chief Complaint  Patient presents with   Chest Pain   Shortness of Breath    Jeffrey [name] is a 57 y.o. male with history of unprovoked pulmonary embolism/ DVT on Eliquis here for evaluation of chest pain.  Located to the right and central chest, worse with deep breathing.  Associated cough.  No lower extremity swelling. However pain to RLE without redness, warmth. No trauma on injury. Noted that he missed multiple episodes of his blood thinners.  Symptoms x 2 days.  Nonexertional in nature.  No associated DOE.  No syncope, back pain, numbness or weakness.  Is able to complete his ADLs without any exertional chest pain.  PCP- [facility]  HPI     Home Medications Prior to Admission medications   Medication Sig Start Date End Date Taking? Authorizing Provider  amLODipine (NORVASC) 5 MG tablet Take 1 tablet (5 mg total) by mouth at bedtime. 02/23/22   Sabino Dick, DO  apixaban (ELIQUIS) 5 MG TABS tablet Take 1 tablet (5 mg total) by mouth 2 (two) times daily. 02/27/22   Bess Kinds, MD  atorvastatin (LIPITOR) 40 MG tablet TAKE 1 TABLET BY MOUTH ONCE DAILY 12/05/21 12/05/22  Bess Kinds, MD  cyclobenzaprine (FLEXERIL) 10 MG tablet Take 1 tablet (10 mg total) by mouth 3 (three) times daily as needed for muscle spasms. Patient not taking: Reported on 02/23/2022 01/17/22   Dawley, Troy C, DO  hydrochlorothiazide (HYDRODIURIL) 12.5 MG tablet Take 1 tablet (12.5 mg total) by mouth daily. 04/17/22   Bess Kinds, MD  NON FORMULARY Pt uses a cpap nighlty    [provider]  olmesartan (BENICAR) 40 MG tablet Take 1/2 tablet by mouth daily at bedtime. 02/23/22   Sabino Dick, DO  oxyCODONE (OXY IR/ROXICODONE) 5 MG immediate release tablet Take 1 tablet (5 mg total) by mouth every 4 (four) hours as needed for moderate pain ((score 4  to 6)). Patient not taking: Reported on 02/23/2022 01/17/22   Dawley, Troy C, DO  traZODone (DESYREL) 50 MG tablet Take 1 tablet  by mouth at bedtime as needed for sleep. Patient not taking: Reported on 01/01/2022 09/22/21   Dohmeier, Porfirio Mylar, MD      Allergies    Patient has no known allergies.    Review of Systems   Review of Systems  Constitutional: Negative.   HENT: Negative.    Respiratory: Negative.    Cardiovascular:  Positive for chest pain and leg swelling. Negative for palpitations.  Gastrointestinal: Negative.   Genitourinary: Negative.   Musculoskeletal: Negative.   Skin: Negative.   Neurological: Negative.   All other systems reviewed and are negative.   Physical Exam Updated Vital Signs BP (!) 132/121 (BP Location: Right Arm)   Pulse 86   Temp 98 F (36.7 C) (Oral)   Resp (!) 22   Ht 6\' 4"  (1.93 m)   Wt 111.6 kg   SpO2 100%   BMI 29.94 kg/m  Physical Exam Vitals and nursing note reviewed.  Constitutional:      General: He is not in acute distress.    Appearance: He is well-developed. He is not ill-appearing, toxic-appearing or diaphoretic.  HENT:     Head: Atraumatic.  Eyes:     Pupils: Pupils are equal, round, and reactive to light.  Cardiovascular:     Rate and Rhythm:  Normal rate and regular rhythm.     Pulses:          Radial pulses are 2+ on the right side and 2+ on the left side.     Heart sounds: Normal heart sounds.  Pulmonary:     Effort: Pulmonary effort is normal. No respiratory distress.     Breath sounds: Normal breath sounds.  Abdominal:     General: There is no distension.     Palpations: Abdomen is soft.  Musculoskeletal:        General: Normal range of motion.     Cervical back: Normal range of motion and neck supple.     Right lower leg: Tenderness present. No edema.     Left lower leg: No tenderness. No edema.     Comments: Tenderness medial posterior right calf without overlying edema, erythema or warmth.  No fluctuance or  induration.  No rashes or lesions.  Skin:    General: Skin is warm and dry.     Capillary Refill: Capillary refill takes less than 2 seconds.  Neurological:     General: No focal deficit present.     Mental Status: He is alert and oriented to person, place, and time.     ED Results / Procedures / Treatments   Labs (all labs ordered are listed, but only abnormal results are displayed) Labs Reviewed  BASIC METABOLIC PANEL - Abnormal; Notable for the following components:      Result Value   Sodium 133 (*)    Chloride 95 (*)    Calcium 8.7 (*)    All other components within normal limits  CBC - Abnormal; Notable for the following components:   Platelets 142 (*)    All other components within normal limits  TROPONIN I (HIGH SENSITIVITY)  TROPONIN I (HIGH SENSITIVITY)    EKG EKG Interpretation  Date/Time:  Sunday Aug 02 2022 17:58:59 EDT Ventricular Rate:  107 PR Interval:  132 QRS Duration: 90 QT Interval:  340 QTC Calculation: 453 R Axis:   84 Text Interpretation: Sinus tachycardia Otherwise normal ECG When compared with ECG of 06-Jan-2022 09:36, No significant change since last tracing Confirmed by Richardean Canal [phone]) on 08/02/2022 8:28:14 PM  Radiology CT Angio Chest PE W and/or Wo Contrast  Result Date: 08/02/2022 CLINICAL DATA:  Chest pain, shortness of breath EXAM: CT ANGIOGRAPHY CHEST WITH CONTRAST TECHNIQUE: Multidetector CT imaging of the chest was performed using the standard protocol during bolus administration of intravenous contrast. Multiplanar CT image reconstructions and MIPs were obtained to evaluate the vascular anatomy. RADIATION DOSE REDUCTION: This exam was performed according to the departmental dose-optimization program which includes automated exposure control, adjustment of the mA and/or kV according to patient size and/or use of iterative reconstruction technique. CONTRAST:  75mL OMNIPAQUE IOHEXOL 350 MG/ML SOLN COMPARISON:  09/17/2021 FINDINGS:  Cardiovascular: No filling defects in the pulmonary arteries to suggest pulmonary emboli. Heart is normal size. Aorta is normal caliber. Extensive coronary artery and scattered aortic calcifications. No aneurysm. Mediastinum/Nodes: No mediastinal, hilar, or axillary adenopathy. Trachea and esophagus are unremarkable. Thyroid unremarkable. Lungs/Pleura: Minimal dependent atelectasis at the left lung base. No confluent opacities otherwise. No effusions. Upper Abdomen: Diffuse low-density within the liver compatible with fatty infiltration. No acute findings. Musculoskeletal: Chest wall soft tissues are unremarkable. No acute bony abnormality. Review of the MIP images confirms the above findings. IMPRESSION: No evidence of pulmonary embolus. Diffuse coronary artery disease. Left base atelectasis. Hepatic steatosis. Aortic Atherosclerosis (ICD10-I70.0). Electronically  Signed   By: Charlett Nose M.D.   On: 08/02/2022 22:15   DG Chest 2 View  Result Date: 08/02/2022 CLINICAL DATA:  cp EXAM: CHEST - 2 VIEW COMPARISON:  July 19, 2017 FINDINGS: Evaluation is limited secondary to rotation. The cardiomediastinal silhouette is unchanged in contour.Unchanged blunting of the RIGHT hemithorax. No pleural effusion. No pneumothorax. No acute pleuroparenchymal abnormality. Visualized abdomen is unremarkable. Mild degenerative changes of the thoracic spine. Severe degenerative changes of the LEFT shoulder. IMPRESSION: No acute cardiopulmonary abnormality. Electronically Signed   By: Meda Klinefelter M.D.   On: 08/02/2022 18:55    Procedures Procedures    Medications Ordered in ED Medications  morphine (PF) 4 MG/ML injection 4 mg (4 mg Intravenous Given 08/02/22 2100)  iohexol (OMNIPAQUE) 350 MG/ML injection 75 mL (75 mLs Intravenous Contrast Given 08/02/22 2204)    ED Course/ Medical Decision Making/ A&P    57 year old history of unprovoked PE/DVT supposed to be on Eliquis however missed multiple doses here for  evaluation of pleuritic chest pain, shortness of breath and cough over the last 2 days.  He has no exertional symptoms.  Symptoms do not seem consistent with ACS, dissection, unstable angina.  Of note he has also had some pain to his right medial posterior calf.  No obvious cellulitis, abscess.  His compartments are soft I low suspicion for compartment syndrome.  He is neurovascularly intact low suspicion for acute arterial occlusion, claudication.  Recent traumatic injuries.  Will plan on labs, imaging and reassess, treat pain  Labs and imaging personally viewed and interpreted:  CBC without leukocytosis Metabolic panel sodium 133, troponin 6 Chest x-ray without pneumonia, cardiomegaly, edema, pneumothorax EKG sinus tachycardia, no ST changes  Discussed plan with patient.  Will plan on CTA chest.  Also discussed we do not have ultrasound currently present to obtain ultrasound of his right lower extremity.  Will need to follow-up, outpatient order placed by myself.  At this time low suspicion for unstable angina, ACS, dissection, PE, pneumonia, pneumothorax-follow-up outpatient.  The patient has been appropriately medically screened and/or stabilized in the ED. I have low suspicion for any other emergent medical condition which would require further screening, evaluation or treatment in the ED or require inpatient management.  Patient is hemodynamically stable and in no acute distress.  Patient able to ambulate in department prior to ED.  Evaluation does not show acute pathology that would require ongoing or additional emergent interventions while in the emergency department or further inpatient treatment.  I have discussed the diagnosis with the patient and answered all questions.  Pain is been managed while in the emergency department and patient has no further complaints prior to discharge.  Patient is comfortable with plan discussed in room and is stable for discharge at this time.  I have  discussed strict return precautions for returning to the emergency department.  Patient was encouraged to follow-up with PCP/specialist refer to at discharge.                             Medical Decision Making Amount and/or Complexity of Data Reviewed External Data Reviewed: labs, radiology, ECG and notes. Labs: ordered. Decision-making details documented in ED Course. Radiology: ordered and independent interpretation performed. Decision-making details documented in ED Course. ECG/medicine tests: ordered and independent interpretation performed. Decision-making details documented in ED Course.  Risk OTC drugs. Prescription drug management. Parenteral controlled substances. Decision regarding hospitalization. Diagnosis or treatment significantly limited  by social determinants of health.          Final Clinical Impression(s) / ED Diagnoses Final diagnoses:  Atypical chest pain  Right calf pain    Rx / DC Orders ED Discharge Orders          Ordered    [facility]        08/02/22 2052              Yulissa Needham A, PA-C 08/02/22 2304    Charlynne Pander, MD 08/05/22 2155

## 2022-08-03 ENCOUNTER — Ambulatory Visit (HOSPITAL_COMMUNITY): Admission: RE | Admit: 2022-08-03 | Payer: Commercial Managed Care - PPO | Source: Ambulatory Visit

## 2022-08-04 ENCOUNTER — Ambulatory Visit: Payer: Commercial Managed Care - PPO | Admitting: Physical Therapy

## 2022-08-05 ENCOUNTER — Ambulatory Visit (HOSPITAL_COMMUNITY): Admission: RE | Admit: 2022-08-05 | Payer: Commercial Managed Care - PPO | Source: Ambulatory Visit

## 2022-08-06 ENCOUNTER — Encounter: Payer: Commercial Managed Care - PPO | Admitting: Physical Therapy

## 2022-08-11 ENCOUNTER — Telehealth: Payer: Self-pay | Admitting: Physical Therapy

## 2022-08-11 ENCOUNTER — Ambulatory Visit: Payer: Commercial Managed Care - PPO | Admitting: Physical Therapy

## 2022-08-11 DIAGNOSIS — M5459 Other low back pain: Secondary | ICD-10-CM

## 2022-08-11 DIAGNOSIS — R2689 Other abnormalities of gait and mobility: Secondary | ICD-10-CM

## 2022-08-11 DIAGNOSIS — M6281 Muscle weakness (generalized): Secondary | ICD-10-CM

## 2022-08-11 NOTE — Telephone Encounter (Signed)
Called pt to touch base prior to 10:15 appt today - does not answer, left voicemail with office callback number

## 2022-08-11 NOTE — Therapy (Addendum)
OUTPATIENT PHYSICAL THERAPY NO CHARGE VISIT + NO VISIT DISCHARGE (see below)  [name] MRN: [id number] DOB:May 26, 1965, 57 y.o., male Today's Date: 08/11/2022    PCP: Bess Kinds, MD   REFERRING PROVIDER: Jadene Pierini, MD  END OF SESSION:   PT End of Session - 08/11/22 1030     Visit Number 21    Number of Visits 25    Date for PT Re-Evaluation 08/18/22    Authorization Type [place] [employer]    Authorization Time Period auth after 25th visit    PT Start Time 1017   no charge visit                 Past Medical History:  Diagnosis Date   Acute pulmonary embolus (HCC)    bilateral submassive PE in setting of missing several doses of Xarelto for his DVT   Arthritis    DVT (deep venous thrombosis) (HCC) 11/28/2013   RT LEG   High cholesterol    Hypertension    Lumbar spine scoliosis    Marijuana use    Sleep apnea    Past Surgical History:  Procedure Laterality Date   ABDOMINAL EXPOSURE N/A 01/14/2022   Procedure: ABDOMINAL EXPOSURE;  Surgeon: Cephus Shelling, MD;  Location: [facility] OR;  Service: Vascular;  Laterality: N/A;   ANTERIOR LUMBAR FUSION Left 01/14/2022   Procedure: Lumbar three-four Lumbar four-five Oblique Lumbar Interbody Fusion;  Surgeon: Jadene Pierini, MD;  Location: [facility] OR;  Service: Neurosurgery;  Laterality: Left;   HERNIA REPAIR     2005 and 2011; umbilical hernia repair   KNEE SURGERY Bilateral    Left knee 1993, right knee 2004   TONSILLECTOMY     TOTAL KNEE ARTHROPLASTY Left 02/10/2017   Procedure: LEFT TOTAL KNEE ARTHROPLASTY;  Surgeon: Tarry Kos, MD;  Location: [facility] OR;  Service: Orthopedics;  Laterality: Left;   TOTAL KNEE ARTHROPLASTY Right 07/28/2017   Procedure: RIGHT TOTAL KNEE ARTHROPLASTY;  Surgeon: Tarry Kos, MD;  Location: [facility] OR;  Service: Orthopedics;  Laterality: Right;   TRANSFORAMINAL LUMBAR INTERBODY FUSION W/ MIS 1 LEVEL N/A 01/14/2022   Procedure: OPEN TRANSFORAMINAL LUMBAR INTERBODY  FUSION  LUMBAR FIVE-SACRAL ONE ,LAMINECTOMIES AND POSTERIOR LATERAL INSTRUMENTATION FUSION  LUMBAR THREE TO FIVE SACRAL ONE;  Surgeon: Jadene Pierini, MD;  Location: [facility] OR;  Service: Neurosurgery;  Laterality: N/A;   Patient Active Problem List   Diagnosis Date Noted   Leg swelling 02/23/2022   Scoliosis 01/14/2022   Snoring 09/22/2021   Insomnia due to mental condition 09/22/2021   At risk for mood disturbance 09/01/2021   Sleep disturbance 07/28/2021   Chronic bilateral low back pain without sciatica 07/25/2019   Neurogenic claudication 05/19/2019   Hyperlipidemia LDL goal <70 07/20/2018   History of pulmonary embolus (PE) 07/20/2017   Mild CAD 07/20/2017   S/p total knee replacement, bilateral 02/10/2017   Chronic anticoagulation 01/26/2017   Pulmonary embolism (HCC) 06/03/2016   Encounter for chronic pain management 04/06/2014   Tobacco abuse 12/05/2013   DVT (deep venous thrombosis) (HCC) 11/28/2013   HTN (hypertension) 05/04/2013   Lumbar back pain 05/04/2013   BMI 33.0-33.9,adult 05/04/2013   Hx of substance abuse (HCC) 05/04/2013    REFERRING DIAG:  M54.50 (ICD-10-CM) - Lumbar back pain  THERAPY DIAG:  Other low back pain  Muscle weakness (generalized)  Other abnormalities of gait and mobility  Rationale for Evaluation and Treatment Rehabilitation  PERTINENT HISTORY:  CAD, PE, DVT, HTN S/p  lumbar interbody fusion 01/14/22  PRECAUTIONS: Back, abdominal and posterior incisions Per pt on 03/18/22, spine precautions lifted by surgeon at most recent visit     SUBJECTIVE:                                                                                                                                                                                      SUBJECTIVE STATEMENT:  08/11/2022 see assessment, no charge visit    PAIN:  Are you having pain: NA Location: low back How would you describe your pain? Stiff, achey Best in past week: 3/10 with movement, no  resting pain at best Worst in past week: 4/10 Aggravating factors: walking/standing household distances, prolonged sitting Easing factors: rest breaks, ice    OBJECTIVE: (objective measures completed at initial evaluation unless otherwise dated)    DIAGNOSTIC FINDINGS:  Post op interbody fusion lumbar   PATIENT SURVEYS:  FOTO 32% FOTO 03/04/22 32% FOTO 03/31/22 35% 04/29/22: 37%  05/27/22: 40% 06/23/22: 42%    COGNITION: Overall cognitive status: Within functional limits for tasks assessed                          SENSATION: Light touch intact B LEs     POSTURE: increased kyphosis and forward head posture, UT elevation   PALPATION/OBSERVATION: Deferred as pt wears back brace to eval - states he saw MD this morning and no concerns; abdominal incision healed and posterior incision with mild drainage but WNL per pt report   LUMBAR ROM:    AROM 03/18/22  Flexion  75% distal to knee, mild pain  Extension  25% p!  Right lateral flexion    Left lateral flexion    Right rotation 50% stiffness  Left rotation  50% stiffness   (Blank rows = not tested) Comments: Deferred on eval given spine precautions 04/29/22: Deferred given time constraints and pt arriving with back brace donned     LOWER EXTREMITY MMT:     MMT Right eval Left eval Right 03/31/22 Left 03/31/22 R/L 04/29/22  Hip flexion 4p! 4p! 4+ P 4+ 5/5 (stiffness R side)  Hip abduction (modified sitting) 4+ 4+ 4+ 4+ 4+/4+  Hip internal rotation         Hip external rotation     4- 4 4+/4+  Knee flexion 5 4+ 5 5   Knee extension 4+ 4+ 5 5    (Blank rows = not tested)   Comments: mild pulling in hamstring with knee ext on R, no pain with resistance; mild pain with hip flex B that resolves with cessation of movement  FUNCTIONAL TESTS:  Sit to stand transfer, standard chair: heavy use of UE on rollator and chair, reduced fwd weight shift, wide BOS, significantly inc time and effort   TUG: 22 seconds from standard  chair w/ rollator, gait mechanics as below, heavy UE use  02/16/22: TUG 12sec standard chair with rollator, continues with impaired gait mechanics and heavy UE use, fwd flexed posture  2 minute walk test: 03/31/22: 295 Feet  04/29/22: 435ft w/ rollator  353ft gait no AD, RPE 5/10, limited by fatigue, SBA 05/11/22: Tandem stance 30 sec   06/23/22:  BERG 46/56 (see below) no AD, SBA 445ft 8/10 RPE reported (HR up to 118bpm, SpO2 98%. Recovers to 89bpm sitting)        BERG BALANCE: 43/56 (04/23/22)    06/23/22 BERG Sitting to Standing: Numbers; 0-4: 3  4. Stands without using hands and stabilize independently  3. Stands independently using hands  2. Stands using hands after multiple trials  1. Min A to stand  0. Mod-Max A to stand Standing unsupported: Numbers; 0-4: 4  4. Stands safely for 2 minutes  3. Stands 2 minutes with supervision  2. Stands 30 seconds unsupported  1. Needs several tries to stand unsupported for 30 seconds  0. Unable to stand unsupported for 30 seconds Sitting unsupported: Numbers; 0-4: 4  4. Sits for 2 minutes independently  3. Sits for 2 minutes with supervision  2. Able to sit 30 seconds  1. Able to sit 10 seconds  0. Unable to sit for 10 seconds Standing to Sitting: Numbers; 0-4: 3 4. Sits safely with minimal use of hands 3. Controls descent with hands 2. Uses back of legs against chair to control descent 1. Sits independently, but uncontrolled descent 0. Needs assistance Transfers: Numbers; 0-4: 3  4. Transfers safely with minor use of hands  3. Transfers safely definite use of hands  2. Transfers with verbal cueing/supervision  1. Needs 1 person assist  0. Needs 2 person assist  Standing with eyes closed: Numbers; 0-4: 4  4. Stands safely for 10 seconds  3. Stands 10 seconds with supervision   2. Able to stand for 3 seconds  1. Unable to keep eyes closed for 3 seconds, but is safe  0. Needs assist to keep from falling Standing with  feet together: Numbers; 0-4: 4 4. Stands for 1 minute safely 3. Stands for 1 minute with supervision 2. Unable to hold for 30 seconds  1. Needs help to attain position but can hold for 15 seconds  0. Needs help to attain position and unable to hold for 15 seconds Reaching forward with outstretched arm: Numbers; 0-4: 4  4. Reaches forward 10 inches  3. Reaches forward 5 inches  2. Reaches forward 2 inches  1. Reaches forward with supervision  0. Loses balance/requires assistace Retrieving object from the floor: Numbers; 0-4: 1 4. Able to pick up easily and safely 3. Able to pick up with supervision 2. Unable to pick up, but reaches within 1-2 inches independently 1. Unable to pick up and needs supervision 0. Unable/needs assistance to keep from falling  Turning to look behind: Numbers; 0-4: 4  4. Looks behind from both sides and weight shifts well  3. Looks behind one side only, other side less weight shift  2. Turns sideways only, maintains balance  1. Needs supervision when turning  0. Needs assistance  Turning 360 degrees: Numbers; 0-4: 2  4. Able to turn in </=4 seconds  3. Able to turn on one side in </= 4 seconds   2. Able to turn slowly, but safely  1. Needs supervision or verbal cueing  0. Needs assistance Place alternate foot on stool: Numbers; 0-4: 4 4. Completes 8 steps in 20 seconds 3. Completes 8 steps in >20 seconds 2. 4 steps without assistance/supervision 1. Completes >2 steps with minimal assist 0. Unable, needs assist to keep from falling Standing with one foot in front: Numbers; 0-4: 3  4. Independent tandem for 30 seconds  3. Independent foot ahead for 30 seconds  2. Independent small step for 30 seconds  1. Needs help to step, but can hold for 15 seconds  0. Loses balance while standing/stepping Standing on one foot: Numbers; 0-4: 3 4. Holds >10 seconds 3. Holds 5-10 seconds 2. Holds >/=3 seconds  1. Holds <3 seconds 0. Unable   Total Score: 46/56       GAIT: Distance walked: within clinic Assistive device utilized: rollator Level of assistance: Modified independence Comments: significant forward flexed posture, partial step through pattern with intermittent step to, reduced hip extension B, reduced knee ROM throughout swing/stance    TODAY'S TREATMENT:  [facility] Adult PT Treatment:                                                DATE: 5.14.24 No charge visit, treatment deferred, see assessment   PATIENT EDUCATION:  Education details: rationale for deferring treatment on this date, PT POC Person educated: Patient Education method: Explanation, Demonstration, Tactile cues, Verbal cues Education comprehension: verbalized understanding, returned demonstration, verbal cues required, tactile cues required, and needs further education    HOME EXERCISE PROGRAM: Access Code: [email or web address] URL: [email or web address] Date: 07/21/2022 Prepared by: Fransisco Hertz  Program Notes - lying on back trying to get hips to neutral, 3 rounds of 30sec per day  Exercises - Sit to Stand with Armchair  - 1 x daily - 7 x weekly - 2 sets - 10 reps - Heel Raises with Counter Support  - 1 x daily - 7 x weekly - 2 sets - 10 reps - Standing Row with Anchored Resistance  - 1 x daily - 7 x weekly - 2-3 sets - 10 reps - 5 hold - Shoulder extension with resistance - Neutral  - 1 x daily - 7 x weekly - 2-3 sets - 10 reps - 5 hold - Standing Anti-Rotation Press with Anchored Resistance  - 1 x daily - 7 x weekly - 1-2 sets - 10 reps - 3-5 hold - Seated Hip Adduction Isometrics with Ball  - 1 x daily - 7 x weekly - 3 sets - 10 reps - Seated Trunk Rotation - Arms Crossed  - 1 x daily - 7 x weekly - 3 sets - 5 reps   ASSESSMENT:   CLINICAL IMPRESSION: No charge visit today - pt arrives and states he is still receiving work up over concerns with his RLE pain/swelling, also had episodes of chest pain for which he was seen in ED (see EPIC for details).  Given ongoing medical work up and no [place] yet performed for RLE, treatment to be deferred on this date. Pt states he follows up with PCP next week. Placing PT on hold until physician clearance received to resume given ongoing medical issues. Pt verbalizes agreement/understanding with this plan, departs clinic in  no acute distress, denies any questions/concerns.     OBJECTIVE IMPAIRMENTS: Abnormal gait, decreased activity tolerance, decreased balance, decreased endurance, decreased mobility, difficulty walking, decreased ROM, decreased strength, hypomobility, improper body mechanics, postural dysfunction, and pain.    ACTIVITY LIMITATIONS: carrying, lifting, bending, sitting, standing, squatting, sleeping, stairs, transfers, bed mobility, bathing, toileting, dressing, and locomotion level   PARTICIPATION LIMITATIONS: meal prep, cleaning, laundry, shopping, community activity, and yard work   PERSONAL FACTORS: Time since onset of injury/illness/exacerbation and 3+ comorbidities: CAD, PE, DVT, HTN  are also affecting patient's functional outcome.    REHAB POTENTIAL: Good   CLINICAL DECISION MAKING: Stable/uncomplicated   EVALUATION COMPLEXITY: Low     GOALS: Goals reviewed with patient? Yes   SHORT TERM GOALS: Target date: 02/25/2022   Pt will demonstrate appropriate understanding and performance of initially prescribed HEP in order to facilitate improved independence with management of symptoms.  Baseline: HEP provided on eval 03/04/22: Pt reports independence w/ HEP Goal status: MET   2. Pt will score greater than or equal to 42% on FOTO in order to demonstrate improved perception of function due to symptoms.            Baseline: 32% 03/04/22: 32% 03/31/22: 35% 04/29/22: 37%  06/23/22: 42%             Goal status:  MET   LONG TERM GOALS: Target date: 08/18/2022   (updated 07/21/22 given gap in care)              Pt will score 51% on FOTO in order to demonstrate improved perception of  functional status due to symptoms.  Baseline: 32% 03/31/22: 35% 04/29/22: 37%  06/23/22: 42%  Goal status: PROGRESSING   2.  Pt will demonstrate ability to ambulate at least 1039ft with LRAD and no more than 2pt increase in baseline pain on NPS in order to facilitate improved tolerance to community ambulation.  Baseline: pt unable to tolerate greater than household distances due to significant inc in pain 03/31/22: able to complete 2 minute walk test at 295 feet with increased pain from 5/10 to 7/10.  04/29/22: 419ft w/ rollator no increase in pain , significant fatigue; 373ft no AD limited by fatigue    06/23/22: 476ft no AD limited by fatigue, increase in pain reported  Goal status: PROGRESSING   3.  Pt will demonstrate hip flexion MMT of 5/5 in order to facilitate improved clearance with gait for reduced fall risk.  Baseline: 4/5 B w pain 03/31/21: see chart 04/29/22: see chart Goal status: MET   4. Pt will be able to perform TUG in less than or equal to 13 sec with LRAD in order to indicate reduced risk of falling (cutoff score for fall risk 13.5 sec in community dwelling older adults per [facility] et al, 2000)            Baseline: 22sec with rollator  02/16/22: 12sec with rollator             Goal status: MET   5. Pt will score >47/56 on BERG to indicate reduced fall risk and increased safety w/ mobility.   04/23/22: 43/56  06/23/22: 46/56   Goal status: NEARLY MET   PLAN (extended 07/21/22 given gap in care):   PT FREQUENCY: 2x/week   PT DURATION: 3 weeks   PLANNED INTERVENTIONS: Therapeutic exercises, Therapeutic activity, Neuromuscular re-education, Balance training, Gait training, Patient/Family education, Self Care, Stair training, DME instructions, Aquatic Therapy, Cryotherapy, Moist heat, Taping, Manual  therapy, and Re-evaluation.   PLAN FOR NEXT SESSION:   Re-eval after clearance from MD  Ashley Murrain PT, DPT 08/11/2022 10:30 AM    Addendum for no visit  discharge 08/19/22:    PHYSICAL THERAPY DISCHARGE SUMMARY  Visits from Start of Care: 21  Current functional level related to goals / functional outcomes: Unable to be assessed - refer to chart for most updated assessments   Remaining deficits: Unable to assess   Education / Equipment: Education on PT POC, following up with providers given change in medical status, new referral to resume PT once cleared by providers  Patient agrees to discharge. Patient goals were  unable to be assessed . Patient is being discharged due to a change in medical status (new LE DVT).   Ashley Murrain PT, DPT 08/19/2022 8:56 AM

## 2022-08-14 ENCOUNTER — Ambulatory Visit: Payer: Commercial Managed Care - PPO | Admitting: Physical Therapy

## 2022-08-18 ENCOUNTER — Other Ambulatory Visit (HOSPITAL_COMMUNITY): Payer: Self-pay

## 2022-08-18 ENCOUNTER — Encounter: Payer: Self-pay | Admitting: Family Medicine

## 2022-08-18 ENCOUNTER — Ambulatory Visit (HOSPITAL_BASED_OUTPATIENT_CLINIC_OR_DEPARTMENT_OTHER)
Admission: RE | Admit: 2022-08-18 | Discharge: 2022-08-18 | Disposition: A | Discharge status: Home / Self Care | Payer: Commercial Managed Care - PPO | Source: Ambulatory Visit | Attending: Surgery | Admitting: Surgery

## 2022-08-18 ENCOUNTER — Ambulatory Visit (INDEPENDENT_AMBULATORY_CARE_PROVIDER_SITE_OTHER): Payer: Commercial Managed Care - PPO | Admitting: Family Medicine

## 2022-08-18 ENCOUNTER — Encounter (HOSPITAL_COMMUNITY): Payer: Self-pay

## 2022-08-18 ENCOUNTER — Other Ambulatory Visit: Payer: Self-pay

## 2022-08-18 ENCOUNTER — Ambulatory Visit (HOSPITAL_COMMUNITY)
Admission: RE | Admit: 2022-08-18 | Discharge: 2022-08-18 | Disposition: A | Discharge status: Home / Self Care | Payer: Commercial Managed Care - PPO | Source: Ambulatory Visit | Attending: Family Medicine | Admitting: Family Medicine

## 2022-08-18 VITALS — BP 155/101 | HR 79 | Ht 76.0 in | Wt 250.6 lb

## 2022-08-18 VITALS — BP 158/105 | HR 72

## 2022-08-18 DIAGNOSIS — I82411 Acute embolism and thrombosis of right femoral vein: Secondary | ICD-10-CM

## 2022-08-18 DIAGNOSIS — M79604 Pain in right leg: Secondary | ICD-10-CM | POA: Diagnosis not present

## 2022-08-18 DIAGNOSIS — I10 Essential (primary) hypertension: Secondary | ICD-10-CM

## 2022-08-18 MED ORDER — OLMESARTAN MEDOXOMIL 40 MG PO TABS
40.0000 mg | ORAL_TABLET | Freq: Every day | ORAL | 3 refills | Status: DC
Start: 2022-08-18 — End: 2024-02-14
  Filled 2022-08-18: qty 90, 90d supply, fill #0
  Filled 2023-01-19: qty 90, 90d supply, fill #1
  Filled 2023-05-17: qty 90, 90d supply, fill #2

## 2022-08-18 MED ORDER — HYDROCHLOROTHIAZIDE 25 MG PO TABS
25.0000 mg | ORAL_TABLET | Freq: Every day | ORAL | 3 refills | Status: DC
Start: 2022-08-18 — End: 2023-01-01
  Filled 2022-08-18: qty 90, 90d supply, fill #0

## 2022-08-18 MED ORDER — APIXABAN (ELIQUIS) VTE STARTER PACK (10MG AND 5MG)
ORAL_TABLET | ORAL | 0 refills | Status: DC
  Filled 2022-08-18: qty 74, 28d supply, fill #0

## 2022-08-18 NOTE — Patient Instructions (Addendum)
It was wonderful to see you today.  Please bring ALL of your medications with you to every visit.   Today we talked about:  I have ordered an ultrasound of your leg to evaluate for a blood clot. You have an appointment at [facility] at 2 PM.  Continue your Eliquis.  I will let you know the results of the study and if your medication needs to be adjusted.  Your blood pressure was quite elevated today.  We have made some adjustments to your medication.  Take 40 mg of olmesartan daily, take 25 mg of hydrochlorothiazide daily.  Continue your amlodipine.  Return next week for blood pressure recheck and some blood work.  Thank you for coming to your visit as scheduled. We have had a large "no-show" problem lately, and this significantly limits our ability to see and care for patients. As a friendly reminder- if you cannot make your appointment please call to cancel. We do have a no show policy for those who do not cancel within 24 hours. Our policy is that if you miss or fail to cancel an appointment within 24 hours, 3 times in a 72-month period, you may be dismissed from our clinic.   Thank you for choosing [facility].   Please call [phone] with any questions about today's appointment.  Please be sure to schedule follow up at the front  desk before you leave today.   Sabino Dick, DO PGY-3 Family Medicine

## 2022-08-18 NOTE — Progress Notes (Addendum)
DVT Clinic Note  Name: Jeffrey [name]     MRN: [id number]     DOB: [birth year]     Sex: male  PCP: Bess Kinds, MD  Today's Visit: Visit Information: Initial Visit  Referred to DVT Clinic by: Dr. Melba Coon (Family Medicine)  Referred to CPP by: Dr. Myra Gianotti Reason for referral:  Chief Complaint  Patient presents with   DVT   HISTORY OF PRESENT ILLNESS:  Jeffrey [name] is a 57 y.o. male with PMH DVT, PE, HTN, HLD, CAD, current smoker, who presents after diagnosis of DVT for medication management. First DVT was in 2015 and thought to be unprovoked which was as proximal as the common femoral vein. He was treated with Xarelto. He then had a bilateral submassive PE in 2018 due to patient self-discontinuation of Xarelto. He was switched to Eliquis and has been on Eliquis since then. Repeat ultrasounds since then (most recent 01/2022) have shown chronic clot in the RLE as proximal as the common femoral vein.   Patient presented to the ED 08/02/22 with chest pain and SOB. CTA showed no evidence of PE. He was to follow up for an outpatient ultrasound to rule out DVT which he did not complete. He was seen by family medicine today for worsening RLE pain and was scheduled for an ultrasound which showed age indeterminate DVT in the right common femoral vein and chronic DVT of the right femoral and popliteal veins.   Today, patient reports he missed 2-3 full days of Eliquis in the span of a week. Says these days were not back to back but were relatively close together. He had medication on hand and does not have trouble with the cost of Eliquis. Reports missed doses were due to just being off on his schedule for those days. His new symptoms of pain and swelling that started in the R calf started about a week after his missed doses. Since his ED visit this pain has continued to increase and move up to his groin. Says his leg feels like how it felt when he had his first DVT in 2015. He has been in physical  therapy since his back surgery in October 2023 but has had to stop going within the past week due to the pain. He is in a wheelchair today and has a cane with him that he's been walking with. Is not wearing compression stockings. Denies CP, SOB, palpitations today.    Positive Thrombotic Risk Factors: Previous VTE, Smoking (Recent missed doses of Eliquis) Bleeding Risk Factors: Anticoagulant therapy  Negative Thrombotic Risk Factors: Recent surgery (within 3 months), Recent trauma (within 3 months), Bed rest >72 hours within 3 months, Testosterone therapy, Recent COVID diagnosis (within 3 months), Recent admission to hospital with acute illness (within 3 months), Older age, Active cancer, Estrogen therapy, Pregnancy, Recent cesarean section (within 3 months), Within 6 weeks postpartum, Sedentary journey lasting >8 hours within 4 weeks, Paralysis, paresis, or recent plaster cast immobilization of lower extremity, Central venous catheterization, Known thrombophilic condition, Non-malignant, chronic inflammatory condition, Erythropoiesis-stimulating agent, Obesity  Rx Insurance Coverage: Commercial Rx Affordability: Eliquis is currently $10/month  Preferred Pharmacy: [facility]   Past Medical History:  Diagnosis Date   Acute pulmonary embolus (HCC)    bilateral submassive PE in setting of missing several doses of Xarelto for his DVT   Arthritis    DVT (deep venous thrombosis) (HCC) 11/28/2013   RT LEG   High cholesterol    Hypertension  Lumbar spine scoliosis    Marijuana use    Sleep apnea     Past Surgical History:  Procedure Laterality Date   ABDOMINAL EXPOSURE N/A 01/14/2022   Procedure: ABDOMINAL EXPOSURE;  Surgeon: Cephus Shelling, MD;  Location: [facility] OR;  Service: Vascular;  Laterality: N/A;   ANTERIOR LUMBAR FUSION Left 01/14/2022   Procedure: Lumbar three-four Lumbar four-five Oblique Lumbar Interbody Fusion;  Surgeon: Jadene Pierini, MD;  Location:  [facility] OR;  Service: Neurosurgery;  Laterality: Left;   HERNIA REPAIR     2005 and 2011; umbilical hernia repair   KNEE SURGERY Bilateral    Left knee 1993, right knee 2004   TONSILLECTOMY     TOTAL KNEE ARTHROPLASTY Left 02/10/2017   Procedure: LEFT TOTAL KNEE ARTHROPLASTY;  Surgeon: Tarry Kos, MD;  Location: [facility] OR;  Service: Orthopedics;  Laterality: Left;   TOTAL KNEE ARTHROPLASTY Right 07/28/2017   Procedure: RIGHT TOTAL KNEE ARTHROPLASTY;  Surgeon: Tarry Kos, MD;  Location: [facility] OR;  Service: Orthopedics;  Laterality: Right;   TRANSFORAMINAL LUMBAR INTERBODY FUSION W/ MIS 1 LEVEL N/A 01/14/2022   Procedure: OPEN TRANSFORAMINAL LUMBAR INTERBODY FUSION  LUMBAR FIVE-SACRAL ONE ,LAMINECTOMIES AND POSTERIOR LATERAL INSTRUMENTATION FUSION  LUMBAR THREE TO FIVE SACRAL ONE;  Surgeon: Jadene Pierini, MD;  Location: [facility] OR;  Service: Neurosurgery;  Laterality: N/A;    Social History   Socioeconomic History   Marital status: Married    Spouse name: Not on file   Number of children: Not on file   Years of education: Not on file   Highest education level: Not on file  Occupational History   Not on file  Tobacco Use   Smoking status: Every Day    Types: Cigars   Smokeless tobacco: Never   Tobacco comments:    Stopped for up to 5 years total - Peak rate 2ppd  Vaping Use   Vaping Use: Former  Substance and Sexual Activity   Alcohol use: Yes    Alcohol/week: 12.0 standard drinks of alcohol    Types: 12 Cans of beer per week    Comment: seldom wine/liquor   Drug use: Yes    Types: Marijuana    Comment: Cocaine last used in 2004, current marijuana use   Sexual activity: Yes    Birth control/protection: None    Comment: With wife  Other Topics Concern   Not on file  Social History Narrative   Lives in [place].   Chickens as pet.    Hobbies: Editor, commissioning Pulmonary (11/24/16):   Originally from [place]. Has worked in Holiday representative, Personnel officer, Airline pilot, & in a warehouse.  No known asbestos exposure. Previously had chickens as pets. No mold exposure.    Social Determinants of Health   Financial Resource Strain: Not on file  Food Insecurity: Food Insecurity Present (01/14/2022)   Hunger Vital Sign    Worried About Running Out of Food in the Last Year: Sometimes true    Ran Out of Food in the Last Year: Sometimes true  Transportation Needs: No Transportation Needs (01/14/2022)   PRAPARE - Administrator, Civil Service (Medical): No    Lack of Transportation (Non-Medical): No  Physical Activity: Not on file  Stress: Not on file  Social Connections: Not on file  Intimate Partner Violence: Not At Risk (01/14/2022)   Humiliation, Afraid, Rape, and Kick questionnaire    Fear of Current or Ex-Partner: No    Emotionally  Abused: No    Physically Abused: No    Sexually Abused: No    Family History  Problem Relation Age of Onset   Heart disease Other    Arthritis Other    Alcohol abuse Mother    Heart disease Mother    Depression Mother    Hypertension Mother    Kidney disease Mother    Arthritis Mother    Clotting disorder Mother    Arthritis Father    Alcohol abuse Brother    Drug abuse Brother    Sickle cell anemia Brother    Arthritis Maternal Grandmother    Heart disease Maternal Grandmother    Arthritis Maternal Grandfather    Heart disease Maternal Grandfather    Asthma Cousin     Allergies as of 08/18/2022   (No Known Allergies)    Current Outpatient Medications on File Prior to Encounter  Medication Sig Dispense Refill   amLODipine (NORVASC) 5 MG tablet Take 1 tablet (5 mg total) by mouth at bedtime. 90 tablet 3   apixaban (ELIQUIS) 5 MG TABS tablet Take 1 tablet (5 mg total) by mouth 2 (two) times daily. (Patient taking differently: Take 5 mg by mouth 2 (two) times daily. Starting 08/18/22, take 2 tablets (10 mg) twice daily for 7 days, then continue taking 1 tablet (5 mg) twice daily.) 60 tablet 5   atorvastatin (LIPITOR) 40  MG tablet TAKE 1 TABLET BY MOUTH ONCE DAILY 90 tablet 2   hydrochlorothiazide (HYDRODIURIL) 25 MG tablet Take 1 tablet (25 mg total) by mouth daily. 90 tablet 3   olmesartan (BENICAR) 40 MG tablet Take 1 tablet (40 mg total) by mouth at bedtime. 90 tablet 3   NON FORMULARY Pt uses a cpap nighlty     No current facility-administered medications on file prior to encounter.   REVIEW OF SYSTEMS:  Review of Systems  Respiratory:  Negative for shortness of breath.   Cardiovascular:  Positive for leg swelling. Negative for chest pain and palpitations.  Musculoskeletal:  Positive for myalgias.  Neurological:  Positive for tingling. Negative for dizziness.   PHYSICAL EXAMINATION:  Vitals:   08/18/22 1502  BP: (!) 158/105  Pulse: 72  SpO2: 100%   Physical Exam Cardiovascular:     Rate and Rhythm: Normal rate.  Pulmonary:     Effort: Pulmonary effort is normal.  Musculoskeletal:        General: Tenderness present.     Right lower leg: Edema (1+) present.  Skin:    Findings: Erythema present.  Psychiatric:        Mood and Affect: Mood normal.        Behavior: Behavior normal.        Thought Content: Thought content normal.   Villalta Score for Post-Thrombotic Syndrome: Pain: Moderate Cramps: Moderate Heaviness: Moderate Paresthesia: Moderate Pruritus: Mild Pretibial Edema: Mild Skin Induration: Absent Hyperpigmentation: Absent Redness: Mild Venous Ectasia: Absent Pain on calf compression: Mild Villalta Preliminary Score: 12 Is venous ulcer present?: No If venous ulcer is present and score is <15, then 15 points total are assigned: Absent Villalta Total Score: 12  LABS:  CBC     Component Value Date/Time   WBC 8.3 08/02/2022 1830   RBC 4.43 08/02/2022 1830   HGB 14.1 08/02/2022 1830   HGB 13.3 02/23/2022 1422   HGB 15.7 12/23/2016 1240   HCT 41.3 08/02/2022 1830   HCT 39.7 02/23/2022 1422   HCT 47.8 12/23/2016 1240   PLT 142 (L) 08/02/2022 1830  PLT 196  02/23/2022 1422   MCV 93.2 08/02/2022 1830   MCV 91 02/23/2022 1422   MCV 91.6 12/23/2016 1240   MCH 31.8 08/02/2022 1830   MCHC 34.1 08/02/2022 1830   RDW 14.0 08/02/2022 1830   RDW 13.4 02/23/2022 1422   RDW 13.9 12/23/2016 1240   LYMPHSABS 2.0 07/19/2017 1130   LYMPHSABS 1.9 12/23/2016 1240   MONOABS 0.4 07/19/2017 1130   MONOABS 0.7 12/23/2016 1240   EOSABS 0.1 07/19/2017 1130   EOSABS 0.3 12/23/2016 1240   EOSABS 0.1 07/27/2016 1430   BASOSABS 0.0 07/19/2017 1130   BASOSABS 0.1 12/23/2016 1240    Hepatic Function      Component Value Date/Time   PROT 6.6 02/23/2022 1422   PROT 7.8 12/23/2016 1240   ALBUMIN 4.0 02/23/2022 1422   ALBUMIN 3.7 12/23/2016 1240   AST 16 02/23/2022 1422   AST 29 12/23/2016 1240   ALT 13 02/23/2022 1422   ALT 30 12/23/2016 1240   ALKPHOS 112 02/23/2022 1422   ALKPHOS 84 12/23/2016 1240   BILITOT 0.2 02/23/2022 1422   BILITOT 0.48 12/23/2016 1240   BILIDIR 0.20 10/15/2016 0913    Renal Function   Lab Results  Component Value Date   CREATININE 0.75 08/02/2022   CREATININE 0.77 02/23/2022   CREATININE 0.90 01/06/2022    Estimated Creatinine Clearance: 138.9 mL/min (by C-G formula based on SCr of 0.75 mg/dL).   [facility] Vascular Lab Studies:  08/18/22 VAS [place] LOWER EXTREMITY VENOUS (DVT) Summary:  RIGHT:  - Findings consistent with age indeterminate deep vein thrombosis  involving the right common femoral vein, SF junction, and right  gastrocnemius veins.  - Findings consistent with chronic deep vein thrombosis involving the  right femoral vein, and right popliteal vein.  - No cystic structure found in the popliteal fossa.    LEFT:  - No evidence of common femoral vein obstruction.   ASSESSMENT: Location of DVT: Right common femoral vein, Right femoral vein, Right popliteal vein, Right distal vein Cause of DVT: unprovoked - history of unprovoked RLE DVT in 2015, bilateral PE in 2018 related to nonadherence, now with new symptoms of RLE  DVT due to nonadherence  Given DVT involvement of the common femoral vein, I discussed the patient with Dr. Myra Gianotti. Looking back to his imaging from 2015 and beyond, he has always had clot in the right common femoral vein so it's unclear how much of this is chronic clot from his previous DVT. Given the recent missed doses and new symptoms (pain extending from the calf now through the groin) that correlate with the missed doses, we will restart the Eliquis starter pack dosing. Would not consider this a treatment failure of Eliquis given the multiple missed doses. His insurance will not cover filling the starter pack at this time because he has recently filled Eliquis. He can use his current tablets to simulate the starter pack - take two 5 mg tablets twice daily for 7 days followed by one 5 mg tablet twice daily indefinitely. He does not have any cost barriers to Eliquis adherence at this time. Nonadherence seems to be related to a lack of understanding of the importance of adherence to prevent future clots given his history of unprovoked DVT/PE. We had a long discussion on the importance of adherence, starting to wear compression stockings, and elevation. Dr. Myra Gianotti recommends the patient follow up with one of the vascular surgeons within the next week, which I will help arrange.   PLAN: -Start  apixaban (Eliquis) 10 mg twice daily for 7 days followed by 5 mg twice daily. -Expected duration of therapy: Indefinite. Therapy started on 08/18/22. -Patient educated on purpose, proper use and potential adverse effects of apixaban (Eliquis). -Discussed importance of taking medication around the same time every day. -Advised patient of medications to avoid (NSAIDs, aspirin doses >100 mg daily). -Educated that Tylenol (acetaminophen) is the preferred analgesic to lower the risk of bleeding. -Advised patient to alert all providers of anticoagulation therapy prior to starting a new medication or having a  procedure. -Emphasized importance of monitoring for signs and symptoms of bleeding (abnormal bruising, prolonged bleeding, nose bleeds, bleeding from gums, discolored urine, black tarry stools). -Educated patient to present to the ED if emergent signs and symptoms of new thrombosis occur. -Counseled patient to wear compression stockings daily, removing at night.  Follow up: in 1 week with vascular surgery  Pervis Hocking, PharmD, BCACP, CPP [facility] Clinical Pharmacist Practitioner Office: [phone]  I have evaluated the patient's chart/imaging and refer this patient to the Clinical Pharmacist Practitioner for medication management. I have reviewed the CPP's documentation and agree with her assessment and plan. I was immediately available during the visit for questions and collaboration.   Durene Cal, MD

## 2022-08-18 NOTE — Patient Instructions (Addendum)
-  Start apixaban (Eliquis) 10 mg (TWO tablets) twice daily for 7 days followed by 5 mg (ONE tablet) twice daily. -It is important to take your medication around the same time every day.  -Avoid NSAIDs like ibuprofen (Advil, Motrin) and naproxen (Aleve) as well as aspirin doses over 100 mg daily. -Tylenol (acetaminophen) is the preferred over the counter pain medication to lower the risk of bleeding. -Be sure to alert all of your health care providers that you are taking an anticoagulant prior to starting a new medication or having a procedure. -Monitor for signs and symptoms of bleeding (abnormal bruising, prolonged bleeding, nose bleeds, bleeding from gums, discolored urine, black tarry stools). If you have fallen and hit your head OR if your bleeding is severe or not stopping, seek emergency care.  -Go to the emergency room if emergent signs and symptoms of new clot occur (new or worse swelling and pain in an arm or leg, shortness of breath, chest pain, fast or irregular heartbeats, lightheadedness, dizziness, fainting, coughing up blood) or if you experience a significant color change (pale or blue) in the extremity that has the DVT.  -We recommend you wear compression stockings as long as you are having swelling or pain. Be sure to purchase the correct size and take them off at night.   Your next visit will be with vascular surgery at [facility]. They should be reaching out to you to schedule an appointment. If you do not hear from them within the next few days, please call [phone].  If you have any questions or need to reschedule an appointment, please call [phone] [facility].  If you are having an emergency, call 911 or present to the nearest emergency room.   What is a DVT?  -Deep vein thrombosis (DVT) is a condition in which a blood clot forms in a vein of the deep venous system which can occur in the lower leg, thigh, pelvis, arm, or neck. This condition is serious  and can be life-threatening if the clot travels to the arteries of the lungs and causing a blockage (pulmonary embolism, PE). A DVT can also damage veins in the leg, which can lead to long-term venous disease, leg pain, swelling, discoloration, and ulcers or sores (post-thrombotic syndrome).  -Treatment may include taking an anticoagulant medication to prevent more clots from forming and the current clot from growing, wearing compression stockings, and/or surgical procedures to remove or dissolve the clot.

## 2022-08-18 NOTE — Progress Notes (Signed)
    SUBJECTIVE:   CHIEF COMPLAINT / HPI:   Jeffrey [name] is a 57 y.o. male who presents to the [facility] clinic today to discuss the following concerns:   ED Follow Up Patient was seen in the ED on 5/5 for atypical chest pain.  Trop 6>5, blood work unrevealing. Given his history of unprovoked PE/DVT and multiple missed days of his anticoagulation, he did undergo CTA of his chest which did not show any evidence of PE.  They were unable to obtain ultrasound imaging to assess for DVT at that time and this was ordered on outpatient basis, however, does not look like patient followed through with the study.   Patient states that he didn't have the money for the ultrasound. He is having right groin pain. He has not missed any more days of Eliquis since ED visit. No chest pain or shortness of breath. He states that he lost his balance over the weekend and fell on his knees. He states he didn't bruise anything and never hit his head.   His most recent surgery was back surgery in October. No recent travel. No other traumas then what is stated above. States that he has had to stop PT (previously going 2x/week) because they did not want to aggravate things further.   The discomfort feels like sharp and burning. Started behind right knee and now moving up thigh. Started 2 weeks ago. Not taking any medicine for the pain.    PERTINENT  PMH / PSH: Unprovoked DVT/PE 2018 on Eliquis, Hypertension, CAD, hyperlipidemia, tobacco use disorder  OBJECTIVE:   BP (!) 165/105   Pulse 76   Ht 6\' 4"  (1.93 m)   Wt 250 lb 9.6 oz (113.7 kg)   SpO2 99%   BMI 30.50 kg/m   Vitals:   08/18/22 0858 08/18/22 0920  BP: (!) 165/105 (!) 155/101  Pulse: 76 79  SpO2: 99%    General: NAD, pleasant, able to participate in exam Cardiac: RRR, no murmurs. Respiratory: CTAB, normal effort, No wheezes, rales or rhonchi Extremities: pain to posterior right knee and anteriomedial thigh without significant abnormality. Trace edema  to right shin. No edema to LLE.  Psych: Normal affect and mood  ASSESSMENT/PLAN:   1. Right leg pain According to patient, feels similar to previous DVT. He is at risk due to his multiple missed days of anti-coagulation. Wells score for DVT is 3, making him high risk. Reassuringly, no chest pain or SOB today. He also recently had CTA chest that ruled out PE so low concern for PE at this time.  - US Venous Img Lower Unilateral Right; Future; scheduled for 2 PM today. - Continue Eliquis 5 mg BID; will adjust pending DVT study   2. Primary hypertension Elevated x2 checks. Upon review of previous BP, does appear to be chronically elevated. Currently taking Amlodipine 10 mg, Olmesartan 20 mg, HCTZ 12.5 mg. Will adjust.  - Increase olmesartan (BENICAR) to 40 MG tablet; Take 1 tablet (40 mg total) by mouth at bedtime.  Dispense: 90 tablet; Refill: 3 -Increase hydrochlorothiazide (HYDRODIURIL) to 25 MG tablet; Take 1 tablet (25 mg total) by mouth daily.  Dispense: 90 tablet; Refill: 3 -F/u next week for BMP, BP recheck   Sabino Dick, DO [facility]

## 2022-08-19 ENCOUNTER — Telehealth: Payer: Self-pay | Admitting: Physical Therapy

## 2022-08-19 NOTE — Telephone Encounter (Signed)
Returning pt call this AM - pt updates on medical status/workup, discussion re: new finding of LE DVT. Discussion w/ pt that given change in medical status will need to discharge from PT at this time until work up completed and medical clearance received from appropriate providers, will need new referral to resume PT. Pt verbalizes understanding/agreement with this plan, is encouraged to reach out with any questions or concerns and to follow up with providers as planned.

## 2022-08-25 ENCOUNTER — Encounter: Payer: Self-pay | Admitting: Vascular Surgery

## 2022-08-25 ENCOUNTER — Other Ambulatory Visit (HOSPITAL_COMMUNITY): Payer: Self-pay

## 2022-08-25 ENCOUNTER — Ambulatory Visit (INDEPENDENT_AMBULATORY_CARE_PROVIDER_SITE_OTHER): Payer: Commercial Managed Care - PPO | Admitting: Vascular Surgery

## 2022-08-25 VITALS — BP 136/92 | HR 88 | Temp 97.6°F | Resp 16 | Ht 76.0 in | Wt 238.0 lb

## 2022-08-25 DIAGNOSIS — I82411 Acute embolism and thrombosis of right femoral vein: Secondary | ICD-10-CM

## 2022-08-25 NOTE — Progress Notes (Unsigned)
    SUBJECTIVE:   CHIEF COMPLAINT / HPI:   Jeffrey [name] is a 57 y.o. male who presents to the [facility] clinic today to discuss the following concerns:   Hypertension F/U Current medications include Amlodipine 5 mg, HCTZ 25 mg, Olmesartan 40 mg. Reports good*** compliance. Home blood pressure readings range *** systolic and *** diastolic.   DVT  Last seen on 5/21 for continued right leg pain.  He underwent DVT ultrasound which showed age-indeterminate DVT involving right common femoral vein, SF junction, and right gastrocnemius veins. Also with chornic DVT of right femoral vein, and popliteal vein. Was restarted on treatment dosed Eliquis.  He has since had follow-up with vascular surgery and he is not a candidate for percutaneous intervention.  Advised to elevate legs and wear compression stockings for the mild edema of his legs. Was also referred to heme/onc for further evaluation of why he has had multiple clots.    PERTINENT  PMH / PSH: Hypertension, history of PE, hyperlipidemia  OBJECTIVE:   BP (!) 134/100   Pulse (!) 108   Ht 6\' 4"  (1.93 m)   Wt 233 lb 9.6 oz (106 kg)   SpO2 100%   BMI 28.43 kg/m    General: NAD, pleasant, able to participate in exam Cardiac: RRR, no murmurs. Respiratory: CTAB, normal effort, No wheezes, rales or rhonchi Abdomen: Bowel sounds present, nontender, nondistended, no hepatosplenomegaly. Extremities: no edema or cyanosis. Skin: warm and dry, no rashes noted Neuro: alert, no obvious focal deficits Psych: Normal affect and mood  ASSESSMENT/PLAN:   No problem-specific Assessment & Plan notes found for this encounter.     Sabino Dick, DO [facility]

## 2022-08-25 NOTE — Progress Notes (Signed)
[name] MRN: [id number] DOB: October 07, 1965 Sex: male  REASON FOR CONSULT: Right leg DVT  HPI: Jeffrey [name] is a 57 y.o. male, with history of right lower extremity DVT in 2015, PE in 2018 (when he self discontinued his Eliquis) that presents for evaluation of right leg DVT.  He states about 3 weeks ago he missed some doses of his Eliquis.  He noticed some right leg swelling.  He was recently seen in the DVT clinic and had a duplex ultrasound on 08/19/2022 with age-indeterminate thrombus in the common femoral vein and proximal femoral vein in the right leg with other chronic thrombus.  He was restarted on Eliquis and has been instructed for lifetime anticoagulation given multiple events.   Past Medical History:  Diagnosis Date   Acute pulmonary embolus (HCC)    bilateral submassive PE in setting of missing several doses of Xarelto for his DVT   Arthritis    DVT (deep venous thrombosis) (HCC) 11/28/2013   RT LEG   High cholesterol    Hypertension    Lumbar spine scoliosis    Marijuana use    Sleep apnea     Past Surgical History:  Procedure Laterality Date   ABDOMINAL EXPOSURE N/A 01/14/2022   Procedure: ABDOMINAL EXPOSURE;  Surgeon: Cephus Shelling, MD;  Location: [facility] OR;  Service: Vascular;  Laterality: N/A;   ANTERIOR LUMBAR FUSION Left 01/14/2022   Procedure: Lumbar three-four Lumbar four-five Oblique Lumbar Interbody Fusion;  Surgeon: Jadene Pierini, MD;  Location: [facility] OR;  Service: Neurosurgery;  Laterality: Left;   HERNIA REPAIR     2005 and 2011; umbilical hernia repair   KNEE SURGERY Bilateral    Left knee 1993, right knee 2004   TONSILLECTOMY     TOTAL KNEE ARTHROPLASTY Left 02/10/2017   Procedure: LEFT TOTAL KNEE ARTHROPLASTY;  Surgeon: Tarry Kos, MD;  Location: [facility] OR;  Service: Orthopedics;  Laterality: Left;   TOTAL KNEE ARTHROPLASTY Right 07/28/2017   Procedure: RIGHT TOTAL KNEE ARTHROPLASTY;  Surgeon: Tarry Kos, MD;  Location:  [facility] OR;  Service: Orthopedics;  Laterality: Right;   TRANSFORAMINAL LUMBAR INTERBODY FUSION W/ MIS 1 LEVEL N/A 01/14/2022   Procedure: OPEN TRANSFORAMINAL LUMBAR INTERBODY FUSION  LUMBAR FIVE-SACRAL ONE ,LAMINECTOMIES AND POSTERIOR LATERAL INSTRUMENTATION FUSION  LUMBAR THREE TO FIVE SACRAL ONE;  Surgeon: Jadene Pierini, MD;  Location: [facility] OR;  Service: Neurosurgery;  Laterality: N/A;    Family History  Problem Relation Age of Onset   Heart disease Other    Arthritis Other    Alcohol abuse Mother    Heart disease Mother    Depression Mother    Hypertension Mother    Kidney disease Mother    Arthritis Mother    Clotting disorder Mother    Arthritis Father    Alcohol abuse Brother    Drug abuse Brother    Sickle cell anemia Brother    Arthritis Maternal Grandmother    Heart disease Maternal Grandmother    Arthritis Maternal Grandfather    Heart disease Maternal Grandfather    Asthma Cousin     SOCIAL HISTORY: Social History   Socioeconomic History   Marital status: Married    Spouse name: Not on file   Number of children: Not on file   Years of education: Not on file   Highest education level: Not on file  Occupational History   Not on file  Tobacco Use   Smoking status: Every Day  Types: Cigars   Smokeless tobacco: Never   Tobacco comments:    Stopped for up to 5 years total - Peak rate 2ppd  Vaping Use   Vaping Use: Former  Substance and Sexual Activity   Alcohol use: Yes    Alcohol/week: 12.0 standard drinks of alcohol    Types: 12 Cans of beer per week    Comment: seldom wine/liquor   Drug use: Yes    Types: Marijuana    Comment: Cocaine last used in 2004, current marijuana use   Sexual activity: Yes    Birth control/protection: None    Comment: With wife  Other Topics Concern   Not on file  Social History Narrative   Lives in [place].   Chickens as pet.    Hobbies: Editor, commissioning Pulmonary (11/24/16):   Originally from [place]. Has  worked in Holiday representative, Personnel officer, Airline pilot, & in a warehouse. No known asbestos exposure. Previously had chickens as pets. No mold exposure.    Social Determinants of Health   Financial Resource Strain: Not on file  Food Insecurity: Food Insecurity Present (01/14/2022)   Hunger Vital Sign    Worried About Running Out of Food in the Last Year: Sometimes true    Ran Out of Food in the Last Year: Sometimes true  Transportation Needs: No Transportation Needs (01/14/2022)   PRAPARE - Administrator, Civil Service (Medical): No    Lack of Transportation (Non-Medical): No  Physical Activity: Not on file  Stress: Not on file  Social Connections: Not on file  Intimate Partner Violence: Not At Risk (01/14/2022)   Humiliation, Afraid, Rape, and Kick questionnaire    Fear of Current or Ex-Partner: No    Emotionally Abused: No    Physically Abused: No    Sexually Abused: No    No Known Allergies  Current Outpatient Medications  Medication Sig Dispense Refill   amLODipine (NORVASC) 5 MG tablet Take 1 tablet (5 mg total) by mouth at bedtime. 90 tablet 3   apixaban (ELIQUIS) 5 MG TABS tablet Take 1 tablet (5 mg total) by mouth 2 (two) times daily. (Patient taking differently: Take 5 mg by mouth 2 (two) times daily. Starting 08/18/22, take 2 tablets (10 mg) twice daily for 7 days, then continue taking 1 tablet (5 mg) twice daily.) 60 tablet 5   atorvastatin (LIPITOR) 40 MG tablet TAKE 1 TABLET BY MOUTH ONCE DAILY 90 tablet 2   hydrochlorothiazide (HYDRODIURIL) 25 MG tablet Take 1 tablet (25 mg total) by mouth daily. 90 tablet 3   NON FORMULARY Pt uses a cpap nighlty     olmesartan (BENICAR) 40 MG tablet Take 1 tablet (40 mg total) by mouth at bedtime. 90 tablet 3   No current facility-administered medications for this visit.    REVIEW OF SYSTEMS:  [X]  denotes positive finding, [ ]  denotes negative finding Cardiac  Comments:  Chest pain or chest pressure:    Shortness of breath  upon exertion:    Short of breath when lying flat:    Irregular heart rhythm:        Vascular    Pain in calf, thigh, or hip brought on by ambulation:    Pain in feet at night that wakes you up from your sleep:     Blood clot in your veins:    Leg swelling:  x Right      Pulmonary    Oxygen at home:  Productive cough:     Wheezing:         Neurologic    Sudden weakness in arms or legs:     Sudden numbness in arms or legs:     Sudden onset of difficulty speaking or slurred speech:    Temporary loss of vision in one eye:     Problems with dizziness:         Gastrointestinal    Blood in stool:     Vomited blood:         Genitourinary    Burning when urinating:     Blood in urine:        Psychiatric    Major depression:         Hematologic    Bleeding problems:    Problems with blood clotting too easily:        Skin    Rashes or ulcers:        Constitutional    Fever or chills:      PHYSICAL EXAM: There were no vitals filed for this visit.  GENERAL: The patient is a well-nourished male, in no acute distress. The vital signs are documented above. CARDIAC: There is a regular rate and rhythm.  VASCULAR:  Palpable femoral pulses bilaterally Palpable AT pulses bilaterally Right calf circumference 41 cm vs 38 cm left calf PULMONARY: No respiratory distress. ABDOMEN: Soft and non-tender  MUSCULOSKELETAL: There are no major deformities or cyanosis. NEUROLOGIC: No focal weakness or paresthesias are detected. SKIN: There are no ulcers or rashes noted. PSYCHIATRIC: The patient has a normal affect.  DATA:   Lower Venous DVT Study   Patient Name:  Jeffrey [name]  Date of Exam:   08/18/2022  Medical Rec #: [id number]         Accession #:    [id number]  Date of Birth: May 04, 1965         Patient Gender: M  Patient Age:   74 years  Exam Location:  [facility]  Procedure:      VAS [place] LOWER EXTREMITY VENOUS (DVT)  Referring Phys: Lowanda Foster MCINTYRE     ---------------------------------------------------------------------------  -----    Indications: New pain, right groin. History of DVT with missed doses of  anticoagulant.    Anticoagulation: Eliquis.  Comparison Study: 02-24-2022 Prior bilateral lower extremity venous study  showed                   extensive chronic DVT.   Performing Technologist: Jean Rosenthal RDMS, RVT     Examination Guidelines:  A complete evaluation includes B-mode imaging, spectral Doppler, color  Doppler,  and power Doppler as needed of all accessible portions of each vessel.  Bilateral  testing is considered an integral part of a complete examination. Limited  examinations for reoccurring indications may be performed as noted. The  reflux  portion of the exam is performed with the patient in reverse  Trendelenburg.      +---------+---------------+---------+-----------+----------+---------------  --+  RIGHT   CompressibilityPhasicitySpontaneityPropertiesThrombus Aging      +---------+---------------+---------+-----------+----------+---------------  --+  CFV     Partial        Yes      Yes                  Age  Indeterminate  +---------+---------------+---------+-----------+----------+---------------  --+  SFJ     None           No       No  Age  Indeterminate  +---------+---------------+---------+-----------+----------+---------------  --+  FV Prox  Partial        Yes      Yes                  Age  Indeterminate  +---------+---------------+---------+-----------+----------+---------------  --+  FV Mid   Partial        Yes      Yes                  Chronic             +---------+---------------+---------+-----------+----------+---------------  --+  FV DistalPartial        Yes      Yes                  Chronic             +---------+---------------+---------+-----------+----------+---------------  --+  PFV     Full                                                              +---------+---------------+---------+-----------+----------+---------------  --+  POP     Partial        Yes      Yes                  Chronic             +---------+---------------+---------+-----------+----------+---------------  --+  PTV     Full                                                             +---------+---------------+---------+-----------+----------+---------------  --+  PERO    Full                                                             +---------+---------------+---------+-----------+----------+---------------  --+  Gastroc Partial        Yes      Yes                  Age  Indeterminate  +---------+---------------+---------+-----------+----------+---------------  --+       +----+---------------+---------+-----------+----------+--------------+  LEFTCompressibilityPhasicitySpontaneityPropertiesThrombus Aging  +----+---------------+---------+-----------+----------+--------------+  CFV Full           Yes      Yes                                  +----+---------------+---------+-----------+----------+--------------+        Summary:  RIGHT:  - Findings consistent with age indeterminate deep vein thrombosis  involving the right common femoral vein, SF junction, and right  gastrocnemius veins.  - Findings consistent with chronic deep vein thrombosis involving the  right femoral vein, and right popliteal vein.  - No cystic structure found in the popliteal fossa.    LEFT:  - No evidence of common femoral vein obstruction.     *  See table(s) above for measurements and observations.   Electronically signed by Coral Else MD on 08/18/2022 at 7:15:02 PM.   Assessment/Plan:  57 y.o. male, with history of right lower extremity DVT in 2015, PE in 2018 (when he self discontinued his Eliquis) that presents for evaluation of right leg DVT.  He states about 3 weeks  ago he missed some doses of his Eliquis.  He noticed some right leg swelling.He was recently seen in the DVT clinic and had a duplex ultrasound on 08/19/2022 with age-indeterminate thrombus in the common femoral vein and proximal femoral vein in the right leg with other chronic thrombus.  I discussed he had clot into his right common femoral vein back in 2015 with evidence of chronic thrombus here in 2018.  I am not certain his recent venous duplex findings truly represent a new DVT event.  That being said, I do not think he is a candidate for percutaneous intervention as chronic thrombus would not be retrievable with a percutaneous device at this time.  He has some mild right leg swelling and I discussed leg elevation and compression stockings.  We did put him in compression stockings today.  He can follow-up as needed.  He wants answers as to why he had multiple clotting events including a right leg DVT in 2015 and PE in 2018.  States his mother had clot history.  I will refer to heme-onc for further evaluation.   Cephus Shelling, MD [facility] Office: [phone]

## 2022-08-25 NOTE — Patient Instructions (Incomplete)
It was wonderful to see you today.  Please bring ALL of your medications with you to every visit.   Today we talked about:  **  Thank you for coming to your visit as scheduled. We have had a large "no-show" problem lately, and this significantly limits our ability to see and care for patients. As a friendly reminder- if you cannot make your appointment please call to cancel. We do have a no show policy for those who do not cancel within 24 hours. Our policy is that if you miss or fail to cancel an appointment within 24 hours, 3 times in a 6-month period, you may be dismissed from our clinic.   Thank you for choosing [facility].   Please call [phone] with any questions about today's appointment.  Please be sure to schedule follow up at the front  desk before you leave today.   Audryanna Zurita, DO PGY-3 Family Medicine   

## 2022-08-26 ENCOUNTER — Telehealth: Payer: Self-pay

## 2022-08-26 NOTE — Telephone Encounter (Signed)
Patient calls nurse line requesting clarification on Eliquis dosing.   He reports he has been doing 10mg  BID. He reports he feels this is working very well for him. He reports significant improvement in leg pain.   He reports he would like to continue this therapy of 10mg  BID.  He reports he has a hematologist apt scheduled for 6/15.  Will forward to provider who saw patient.

## 2022-08-27 ENCOUNTER — Other Ambulatory Visit: Payer: Self-pay

## 2022-08-27 ENCOUNTER — Ambulatory Visit (INDEPENDENT_AMBULATORY_CARE_PROVIDER_SITE_OTHER): Payer: Commercial Managed Care - PPO | Admitting: Family Medicine

## 2022-08-27 ENCOUNTER — Encounter: Payer: Self-pay | Admitting: Family Medicine

## 2022-08-27 VITALS — BP 114/85 | HR 108 | Ht 76.0 in | Wt 233.6 lb

## 2022-08-27 DIAGNOSIS — I82411 Acute embolism and thrombosis of right femoral vein: Secondary | ICD-10-CM | POA: Diagnosis not present

## 2022-08-27 DIAGNOSIS — I10 Essential (primary) hypertension: Secondary | ICD-10-CM | POA: Diagnosis not present

## 2022-08-27 NOTE — Assessment & Plan Note (Signed)
Taking amlodipine 5 mg, HCTZ 25 mg, and increased olmesartan to 40 mg at last visit. His BP today was 134/100 and 114/85 on initial and repeat. Recommended that he logs his BP at home. BP is well controlled today. Continue current regimen and follow up at next visit in 1 month.

## 2022-08-27 NOTE — Assessment & Plan Note (Signed)
Saw vascular last Tuesday and they increased his eliquis to 10 mg bid for 1 week. He had not returned to maintenance dose but advised to return to taking 5 mg bid to lower bleeding risk. His leg pain is improved and he has been wearing a compression stocking. He is seeing hematology on 6/15. Continue current regimen and follow up after hematology appointment.

## 2022-08-27 NOTE — Progress Notes (Addendum)
    SUBJECTIVE:   CHIEF COMPLAINT / HPI:   Jeffrey [name] is a 57 y.o. male who presents to the [facility] clinic today to discuss the following concerns:   HTN Taking amlodipine 5 mg, HCTZ 25 mg, and increased olmesartan to 40 mg at last visit. He is compliant with his medications. BP today was 134/100 and 114/85 on initial and repeat. He checks his BP at home and it was around 133/96. He has had no adverse effects of this new dose.  DVT F/U Last seen on 5/21 for continued right leg pain.  He underwent DVT ultrasound which showed age-indeterminate DVT involving right common femoral vein, SF junction, and right gastrocnemius veins. Also with chornic DVT of right femoral vein, and popliteal vein. Was restarted on treatment dosed Eliquis 10 mg bid for 7 days, followed by 5mg  bid. He has still been taking 10 mg bid. His leg pain is much better today and he has been wearing a compression stocking on the R leg. They also referred him to hematology and he has an appointment on 6/15.   PERTINENT  PMH / PSH: Hypertension, history of PE, hyperlipidemia  OBJECTIVE:   BP 114/85 (BP Location: Left Arm, Patient Position: Sitting, Cuff Size: Normal)   Pulse (!) 108   Ht 6\' 4"  (1.93 m)   Wt 233 lb 9.6 oz (106 kg)   SpO2 100%   BMI 28.43 kg/m    General: NAD, pleasant, able to participate in exam Cardiac: RRR, no murmurs. Respiratory: CTAB, normal effort, No wheezes, rales or rhonchi Extremities: compression stocking to RLE, no edema to b/l lower extremities Neuro: alert, no obvious focal deficits. Ambulates with walker. Psych: Normal affect and mood  ASSESSMENT/PLAN:   HTN (hypertension) Taking amlodipine 5 mg, HCTZ 25 mg, and increased olmesartan to 40 mg at last visit. His BP today was 134/100 and 114/85 on initial and repeat. Recommended that he logs his BP at home. BP is well controlled today. Continue current regimen and follow up at next visit in 1 month.  DVT (deep venous thrombosis)  (HCC) Saw vascular last Tuesday and they increased his eliquis to 10 mg bid for 1 week. He had not returned to maintenance dose but advised to return to taking 5 mg bid to lower bleeding risk. His leg pain is improved and he has been wearing a compression stocking. He is seeing hematology on 6/15. Continue current regimen and follow up after hematology appointment.     Barrett Shell, Medical Student [facility]   I was personally present and performed or re-performed the history, physical exam and medical decision making activities of this service and have verified that the service and findings are accurately documented in the student's note.  Sabino Dick, DO                  08/27/2022, 10:23 AM

## 2022-08-28 LAB — BASIC METABOLIC PANEL
BUN/Creatinine Ratio: 11 (ref 9–20)
BUN: 11 mg/dL (ref 6–24)
CO2: 24 mmol/L (ref 20–29)
Calcium: 9.8 mg/dL (ref 8.7–10.2)
Chloride: 93 mmol/L — ABNORMAL LOW (ref 96–106)
Creatinine, Ser: 0.99 mg/dL (ref 0.76–1.27)
Glucose: 107 mg/dL — ABNORMAL HIGH (ref 70–99)
Potassium: 4 mmol/L (ref 3.5–5.2)
Sodium: 135 mmol/L (ref 134–144)
eGFR: 88 mL/min/{1.73_m2} (ref >59)

## 2022-08-28 NOTE — Telephone Encounter (Signed)
Called patient to discuss.   However, no answer. VM left to return my call to discuss Eliquis.

## 2022-09-12 ENCOUNTER — Other Ambulatory Visit: Payer: Self-pay

## 2022-09-12 ENCOUNTER — Inpatient Hospital Stay: Payer: Commercial Managed Care - PPO | Attending: Hematology | Admitting: Hematology

## 2022-09-12 ENCOUNTER — Encounter: Payer: Commercial Managed Care - PPO | Admitting: Hematology

## 2022-09-12 ENCOUNTER — Inpatient Hospital Stay: Payer: Commercial Managed Care - PPO

## 2022-09-12 ENCOUNTER — Encounter: Payer: Self-pay | Admitting: Hematology

## 2022-09-12 VITALS — BP 120/90 | HR 89 | Ht 76.0 in | Wt 241.9 lb

## 2022-09-12 DIAGNOSIS — Z7901 Long term (current) use of anticoagulants: Secondary | ICD-10-CM | POA: Diagnosis not present

## 2022-09-12 DIAGNOSIS — M419 Scoliosis, unspecified: Secondary | ICD-10-CM

## 2022-09-12 DIAGNOSIS — F17210 Nicotine dependence, cigarettes, uncomplicated: Secondary | ICD-10-CM

## 2022-09-12 DIAGNOSIS — Z86718 Personal history of other venous thrombosis and embolism: Secondary | ICD-10-CM | POA: Diagnosis not present

## 2022-09-12 DIAGNOSIS — Z86711 Personal history of pulmonary embolism: Secondary | ICD-10-CM

## 2022-09-12 DIAGNOSIS — I82411 Acute embolism and thrombosis of right femoral vein: Secondary | ICD-10-CM

## 2022-09-12 NOTE — Progress Notes (Signed)
HEMATOLOGY/ONCOLOGY CONSULTATION NOTE  Date of Service: 09/12/2022  Patient Care Team: Bess Kinds, MD as PCP - General (Family Medicine) Quintella Reichert, MD as PCP - Cardiology (Cardiology)  CHIEF COMPLAINTS/PURPOSE OF CONSULTATION:  Evaluation and management of Deep Vein Thrombosis  HISTORY OF PRESENTING ILLNESS:   Jeffrey [name] is a wonderful 57 y.o. male who has been referred to [place] by Cephus Shelling, MD  for evaluation and management of Deep Vein Thrombosis.   Patient was seen by Dr. Clelia Croft on 02/16/2018 and was continued on Eliquis.   Today, patient reports that his first blood clot was in the right lower extremity in 2015 which was a superficial clot in the superficial veins leading to the deep vein. He describes endorsing pain and swelling from the bottom of his leg to the thigh. He denies any injuries or trauma to the leg triggering the initial clot in 2015. He denies any history of varicose veins. He reports that the clot was sudden and did not have a trigger. He was smoking 2 packs a day at the time but denies any alcohol use or surgeries or long distance travel or testosterone use at that time. Patient has been on lifelong blood thinners since then. He has been generally consistent with blood thinners but does report discontinuing for a while. He did notice pain in the lower extremity once he discontinued blood thinners for some time.   He reports that he did have an extesnive blood clot in the lung in September 2018.  His clot was removed and he was switched from Xarelto to Eliquis. He denies any surgeries at that time. Patient was smoking 1 pack a day at that time. He did have knee surgery in 2018-19 after having a blood clot in the lung. He denies having any new blood clots around the time of surgery. Patient has been taking blood thinners regularly since 2018 and stays hydrated.   Patient has no symptoms in his right lower extremity at this time. He denies any  pain, swelling or discoloration in the lower extremity. No chronic post thrombotics symptoms at this time. He reports that his edema is worse in his right lower extremity compared to the left. He sometimes uses compression socks.   He denies any major bleeding issues, SOB, or chest pain. He denies any chills, night sweats, or change in bowel habits. He did lose significant weight since having back surgery in 2018 due to disc issues. He does complain of fatigue which limits his movement.   He is a social drinker and consumes 6-12 drinks in 1 week. He currently smokes 3 cigars daily. He has not smoked cigarettes since last year. He reports that his body produces high amounts of potassium and he therefore cannot tolerate potassium-rich foods such as bananas.   MEDICAL HISTORY:  Past Medical History:  Diagnosis Date   Acute pulmonary embolus (HCC)    bilateral submassive PE in setting of missing several doses of Xarelto for his DVT   Arthritis    DVT (deep venous thrombosis) (HCC) 11/28/2013   RT LEG   High cholesterol    Hypertension    Lumbar spine scoliosis    Marijuana use    Sleep apnea     SURGICAL HISTORY: Past Surgical History:  Procedure Laterality Date   ABDOMINAL EXPOSURE N/A 01/14/2022   Procedure: ABDOMINAL EXPOSURE;  Surgeon: Cephus Shelling, MD;  Location: [facility] OR;  Service: Vascular;  Laterality: N/A;   ANTERIOR LUMBAR  FUSION Left 01/14/2022   Procedure: Lumbar three-four Lumbar four-five Oblique Lumbar Interbody Fusion;  Surgeon: Jadene Pierini, MD;  Location: [facility] OR;  Service: Neurosurgery;  Laterality: Left;   HERNIA REPAIR     2005 and 2011; umbilical hernia repair   KNEE SURGERY Bilateral    Left knee 1993, right knee 2004   TONSILLECTOMY     TOTAL KNEE ARTHROPLASTY Left 02/10/2017   Procedure: LEFT TOTAL KNEE ARTHROPLASTY;  Surgeon: Tarry Kos, MD;  Location: [facility] OR;  Service: Orthopedics;  Laterality: Left;   TOTAL KNEE ARTHROPLASTY Right 07/28/2017    Procedure: RIGHT TOTAL KNEE ARTHROPLASTY;  Surgeon: Tarry Kos, MD;  Location: [facility] OR;  Service: Orthopedics;  Laterality: Right;   TRANSFORAMINAL LUMBAR INTERBODY FUSION W/ MIS 1 LEVEL N/A 01/14/2022   Procedure: OPEN TRANSFORAMINAL LUMBAR INTERBODY FUSION  LUMBAR FIVE-SACRAL ONE ,LAMINECTOMIES AND POSTERIOR LATERAL INSTRUMENTATION FUSION  LUMBAR THREE TO FIVE SACRAL ONE;  Surgeon: Jadene Pierini, MD;  Location: [facility] OR;  Service: Neurosurgery;  Laterality: N/A;    SOCIAL HISTORY: Social History   Socioeconomic History   Marital status: Married    Spouse name: Not on file   Number of children: Not on file   Years of education: Not on file   Highest education level: Not on file  Occupational History   Not on file  Tobacco Use   Smoking status: Every Day    Types: Cigars   Smokeless tobacco: Never   Tobacco comments:    Stopped for up to 5 years total - Peak rate 2ppd  Vaping Use   Vaping Use: Former  Substance and Sexual Activity   Alcohol use: Yes    Alcohol/week: 12.0 standard drinks of alcohol    Types: 12 Cans of beer per week    Comment: seldom wine/liquor   Drug use: Yes    Types: Marijuana    Comment: Cocaine last used in 2004, current marijuana use   Sexual activity: Yes    Birth control/protection: None    Comment: With wife  Other Topics Concern   Not on file  Social History Narrative   Lives in [place].   Chickens as pet.    Hobbies: Editor, commissioning Pulmonary (11/24/16):   Originally from [place]. Has worked in Holiday representative, Personnel officer, Airline pilot, & in a warehouse. No known asbestos exposure. Previously had chickens as pets. No mold exposure.    Social Determinants of Health   Financial Resource Strain: Not on file  Food Insecurity: Food Insecurity Present (01/14/2022)   Hunger Vital Sign    Worried About Running Out of Food in the Last Year: Sometimes true    Ran Out of Food in the Last Year: Sometimes true  Transportation Needs: No  Transportation Needs (01/14/2022)   PRAPARE - Administrator, Civil Service (Medical): No    Lack of Transportation (Non-Medical): No  Physical Activity: Not on file  Stress: Not on file  Social Connections: Not on file  Intimate Partner Violence: Not At Risk (01/14/2022)   Humiliation, Afraid, Rape, and Kick questionnaire    Fear of Current or Ex-Partner: No    Emotionally Abused: No    Physically Abused: No    Sexually Abused: No    FAMILY HISTORY: Family History  Problem Relation Age of Onset   Heart disease Other    Arthritis Other    Alcohol abuse Mother    Heart disease Mother    Depression  Mother    Hypertension Mother    Kidney disease Mother    Arthritis Mother    Clotting disorder Mother    Arthritis Father    Alcohol abuse Brother    Drug abuse Brother    Sickle cell anemia Brother    Arthritis Maternal Grandmother    Heart disease Maternal Grandmother    Arthritis Maternal Grandfather    Heart disease Maternal Grandfather    Asthma Cousin     ALLERGIES:  has No Known Allergies.  MEDICATIONS:  Current Outpatient Medications  Medication Sig Dispense Refill   amLODipine (NORVASC) 5 MG tablet Take 1 tablet (5 mg total) by mouth at bedtime. 90 tablet 3   apixaban (ELIQUIS) 5 MG TABS tablet Take 1 tablet (5 mg total) by mouth 2 (two) times daily. (Patient taking differently: Take 5 mg by mouth 2 (two) times daily. Starting 08/18/22, take 2 tablets (10 mg) twice daily for 7 days, then continue taking 1 tablet (5 mg) twice daily.) 60 tablet 5   atorvastatin (LIPITOR) 40 MG tablet TAKE 1 TABLET BY MOUTH ONCE DAILY 90 tablet 2   hydrochlorothiazide (HYDRODIURIL) 25 MG tablet Take 1 tablet (25 mg total) by mouth daily. 90 tablet 3   NON FORMULARY Pt uses a cpap nighlty     olmesartan (BENICAR) 40 MG tablet Take 1 tablet (40 mg total) by mouth at bedtime. 90 tablet 3   No current facility-administered medications for this visit.    REVIEW OF SYSTEMS:     10 Point review of Systems was done is negative except as noted above.  PHYSICAL EXAMINATION: ECOG PERFORMANCE STATUS: 1 - Symptomatic but completely ambulatory  . Vitals:   09/12/22 0754  BP: (!) 120/90  Pulse: 89  SpO2: 100%   Filed Weights   09/12/22 0754  Weight: 241 lb 14.4 oz (109.7 kg)   .Body mass index is 29.44 kg/m.  GENERAL:alert, in no acute distress and comfortable SKIN: no acute rashes, no significant lesions EYES: conjunctiva are pink and non-injected, sclera anicteric OROPHARYNX: MMM, no exudates, no oropharyngeal erythema or ulceration NECK: supple, no JVD LYMPH:  no palpable lymphadenopathy in the cervical, axillary or inguinal regions LUNGS: clear to auscultation b/l with normal respiratory effort HEART: regular rate & rhythm ABDOMEN:  normoactive bowel sounds , non tender, not distended. Extremity: no pedal edema PSYCH: alert & oriented x 3 with fluent speech NEURO: no focal motor/sensory deficits  LABORATORY DATA:  I have reviewed the data as listed .    Latest Ref Rng & Units 09/14/2022    9:18 AM 08/02/2022    6:30 PM 02/23/2022    2:22 PM  CBC  WBC 4.0 - 10.5 K/uL 5.0  8.3  6.0   Hemoglobin 13.0 - 17.0 g/dL 91.4  78.2  95.6   Hematocrit 39.0 - 52.0 % 43.8  41.3  39.7   Platelets 150 - 400 K/uL 162  142  196    .    Latest Ref Rng & Units 09/14/2022    9:18 AM 08/27/2022   12:00 PM 08/02/2022    6:30 PM  CMP  Glucose 70 - 99 mg/dL 86  213  88   BUN 6 - 20 mg/dL 16  11  6    Creatinine 0.61 - 1.24 mg/dL 0.86  5.78  4.69   Sodium 135 - 145 mmol/L 132  135  133   Potassium 3.5 - 5.1 mmol/L 3.6  4.0  3.5   Chloride 98 - 111  mmol/L 100  93  95   CO2 22 - 32 mmol/L 20  24  23    Calcium 8.9 - 10.3 mg/dL 9.0  9.8  8.7   Total Protein 6.5 - 8.1 g/dL 7.0     Total Bilirubin 0.3 - 1.2 mg/dL 0.4     Alkaline Phos 38 - 126 U/L 67     AST 15 - 41 U/L 21     ALT 0 - 44 U/L 22        RADIOGRAPHIC STUDIES: I have personally reviewed the  radiological images as listed and agreed with the findings in the report. VAS [place] LOWER EXTREMITY VENOUS (DVT)  Result Date: 08/18/2022  Lower Venous DVT Study Patient Name:  CLIFORD SEQUEIRA  Date of Exam:   08/18/2022 Medical Rec #: [id number]         Accession #:    [id number] Date of Birth: 27-Jan-1966         Patient Gender: M Patient Age:   6 years Exam Location:  [facility] Procedure:      VAS [place] LOWER EXTREMITY VENOUS (DVT) Referring Phys: Levert Feinstein --------------------------------------------------------------------------------  Indications: New pain, right groin. History of DVT with missed doses of anticoagulant.  Anticoagulation: Eliquis. Comparison Study: 02-24-2022 Prior bilateral lower extremity venous study showed                   extensive chronic DVT. Performing Technologist: Jean Rosenthal RDMS, RVT  Examination Guidelines: A complete evaluation includes B-mode imaging, spectral Doppler, color Doppler, and power Doppler as needed of all accessible portions of each vessel. Bilateral testing is considered an integral part of a complete examination. Limited examinations for reoccurring indications may be performed as noted. The reflux portion of the exam is performed with the patient in reverse Trendelenburg.  +---------+---------------+---------+-----------+----------+-----------------+ RIGHT    CompressibilityPhasicitySpontaneityPropertiesThrombus Aging    +---------+---------------+---------+-----------+----------+-----------------+ CFV      Partial        Yes      Yes                  Age Indeterminate +---------+---------------+---------+-----------+----------+-----------------+ SFJ      None           No       No                   Age Indeterminate +---------+---------------+---------+-----------+----------+-----------------+ FV Prox  Partial        Yes      Yes                  Age Indeterminate  +---------+---------------+---------+-----------+----------+-----------------+ FV Mid   Partial        Yes      Yes                  Chronic           +---------+---------------+---------+-----------+----------+-----------------+ FV DistalPartial        Yes      Yes                  Chronic           +---------+---------------+---------+-----------+----------+-----------------+ PFV      Full                                                           +---------+---------------+---------+-----------+----------+-----------------+  POP      Partial        Yes      Yes                  Chronic           +---------+---------------+---------+-----------+----------+-----------------+ PTV      Full                                                           +---------+---------------+---------+-----------+----------+-----------------+ PERO     Full                                                           +---------+---------------+---------+-----------+----------+-----------------+ Gastroc  Partial        Yes      Yes                  Age Indeterminate +---------+---------------+---------+-----------+----------+-----------------+   +----+---------------+---------+-----------+----------+--------------+ LEFTCompressibilityPhasicitySpontaneityPropertiesThrombus Aging +----+---------------+---------+-----------+----------+--------------+ CFV Full           Yes      Yes                                 +----+---------------+---------+-----------+----------+--------------+     Summary: RIGHT: - Findings consistent with age indeterminate deep vein thrombosis involving the right common femoral vein, SF junction, and right gastrocnemius veins. - Findings consistent with chronic deep vein thrombosis involving the right femoral vein, and right popliteal vein. - No cystic structure found in the popliteal fossa.  LEFT: - No evidence of common femoral vein obstruction.  *See  table(s) above for measurements and observations. Electronically signed by Coral Else MD on 08/18/2022 at 7:15:02 PM.    Final     ASSESSMENT & PLAN:   57 y.o. male with:  Previous hx of unprovoked DVT and PE as detailed above. Risk factor contributing to VTE -- cigarette smoking 1-2 PPD 2. Recent indetermine RLE afe indeterminate DVT 3. Active cigatette/Cigar smoking 4.Protein C deficiency ?? 5. Lumbar spine scoliosis  PLAN:  -hypercoagulable workup in September 2018 showed protein C functional levels mildly low at 57K, all other workups unrevealing for hypercoagulable state -Recent workup shows negative results for abnormal antibodies which rules out inherited clotting disorters such as Factor V Leiden and naturally occurring blood thinners -11/28/2013 US showed acute superficial thrombosis of the greater saphenous vein  -patient did have chronic DVT and superficial venous thrombosis in April 2017 as resulted on [place] lower extremity -12/01/2016 US showed chronic superficial thrombosis of the greater saphenous vein and saphenofemoral junction  -Patient did have extensive clots in lungs in 2018 -discussed details of how chronic DVT is developed -discussed potential factors which may cause a blood clot such as disturbance of blood flow, issues with lining of the blood vessel, or abnormal constituents of blood  -discussed that patient may endorse post thrombositic syndrome -educated patient that if there was a clear provoking event of a blood clot, this would be a one time risk factor and the risk of repeat clots is 1-2% annually -educated patient that if there is no  clear provoking event, the risk of repeat blood clots is 8-15% annually and life-long blood thinners would be recommended -will inform PCP that life-long blood thinners are recommended as there is no clear provoking factor for blood clots -will repeat protein C levels -encouraged absolutely smoking cessation to reduce risk further  blood clots -recommend patient to regularly use compression socks on both lower extremities to keep blood from pooling in legs, prevent stasis dermatitis, and improve vein function  -recommend patient to drink at least 64 ounces of water daily. Informed patient that non-carbonated, non-caffeinated, and non-alcoholic drinks go towards hydration -recommend patient to stay UTD with age appropriate cancer screening such as PSA and prostate testing -answered all of patient's questions in detail  FOLLOW-UP: Labs on Monday RTC with Dr Candise Che as needed  The total time spent in the appointment was 60 minutes* .  All of the patient's questions were answered with apparent satisfaction. The patient knows to call the clinic with any problems, questions or concerns.   Wyvonnia Lora MD MS AAHIVMS [facility] [address] [facility] Hematology/Oncology Physician [facility]  .*Total Encounter Time as defined by the Centers for Medicare and Medicaid Services includes, in addition to the face-to-face time of a patient visit (documented in the note above) non-face-to-face time: obtaining and reviewing outside history, ordering and reviewing medications, tests or procedures, care coordination (communications with other health care professionals or caregivers) and documentation in the medical record.    I,Mitra Faeizi,acting as a Neurosurgeon for Wyvonnia Lora, MD.,have documented all relevant documentation on the behalf of Wyvonnia Lora, MD,as directed by  Wyvonnia Lora, MD while in the presence of Wyvonnia Lora, MD.  .I have reviewed the above documentation for accuracy and completeness, and I agree with the above. Johney Maine MD   ADDENDUM  Component     Latest Ref Rng 09/14/2022  WBC     4.0 - 10.5 K/uL 5.0   RBC     4.22 - 5.81 MIL/uL 4.80   Hemoglobin     13.0 - 17.0 g/dL 41.3   HCT     24.4 - 01.0 % 43.8   MCV     80.0 - 100.0 fL 91.3   MCH     26.0 - 34.0 pg 31.0   MCHC     30.0 - 36.0 g/dL 27.2   RDW      53.6 - 15.5 % 13.6   Platelets     150 - 400 K/uL 162   nRBC     0.0 - 0.2 % 0.0   Neutrophils     % 43   NEUT#     1.7 - 7.7 K/uL 2.1   Lymphocytes     % 40   Lymphocyte #     0.7 - 4.0 K/uL 2.0   Monocytes Relative     % 8   Monocyte #     0.1 - 1.0 K/uL 0.4   Eosinophil     % 8   Eosinophils Absolute     0.0 - 0.5 K/uL 0.4   Basophil     % 1   Basophils Absolute     0.0 - 0.1 K/uL 0.0   Immature Granulocytes     % 0   Abs Immature Granulocytes     0.00 - 0.07 K/uL 0.01   Sodium     135 - 145 mmol/L 132 (L)   Potassium     3.5 - 5.1 mmol/L 3.6   Chloride  98 - 111 mmol/L 100   CO2     22 - 32 mmol/L 20 (L)   Glucose     70 - 99 mg/dL 86   BUN     6 - 20 mg/dL 16   Creatinine     9.52 - 1.24 mg/dL 8.41   Calcium     8.9 - 10.3 mg/dL 9.0   Total Protein     6.5 - 8.1 g/dL 7.0   Albumin     3.5 - 5.0 g/dL 3.9   AST     15 - 41 U/L 21   ALT     0 - 44 U/L 22   Alkaline Phosphatase     38 - 126 U/L 67   Total Bilirubin     0.3 - 1.2 mg/dL 0.4   GFR, Est Non African American     >60 mL/min >60   Anion gap     5 - 15  12   Protein C-Functional     73 - 180 % 73   Protein C, Total     60 - 150 % 62     Legend: (L) Low  Labs reviewed -- no overt protein C deficiency. Ungivigin recurrent and unprovoked nature of VTE would still recommend life long anticoagulation and f/u with PCP

## 2022-09-14 ENCOUNTER — Ambulatory Visit: Payer: Self-pay | Admitting: Student

## 2022-09-14 ENCOUNTER — Other Ambulatory Visit: Payer: Self-pay

## 2022-09-14 ENCOUNTER — Inpatient Hospital Stay: Payer: Commercial Managed Care - PPO

## 2022-09-14 DIAGNOSIS — I82411 Acute embolism and thrombosis of right femoral vein: Secondary | ICD-10-CM

## 2022-09-14 DIAGNOSIS — Z86718 Personal history of other venous thrombosis and embolism: Secondary | ICD-10-CM | POA: Diagnosis not present

## 2022-09-14 LAB — CMP (CANCER CENTER ONLY)
ALT: 22 U/L (ref 0–44)
AST: 21 U/L (ref 15–41)
Albumin: 3.9 g/dL (ref 3.5–5.0)
Alkaline Phosphatase: 67 U/L (ref 38–126)
Anion gap: 12 (ref 5–15)
BUN: 16 mg/dL (ref 6–20)
CO2: 20 mmol/L — ABNORMAL LOW (ref 22–32)
Calcium: 9 mg/dL (ref 8.9–10.3)
Chloride: 100 mmol/L (ref 98–111)
Creatinine: 0.83 mg/dL (ref 0.61–1.24)
GFR, Estimated: >60 mL/min (ref >60)
Glucose, Bld: 86 mg/dL (ref 70–99)
Potassium: 3.6 mmol/L (ref 3.5–5.1)
Sodium: 132 mmol/L — ABNORMAL LOW (ref 135–145)
Total Bilirubin: 0.4 mg/dL (ref 0.3–1.2)
Total Protein: 7 g/dL (ref 6.5–8.1)

## 2022-09-14 LAB — CBC WITH DIFFERENTIAL (CANCER CENTER ONLY)
Abs Immature Granulocytes: 0.01 10*3/uL (ref 0.00–0.07)
Basophils Absolute: 0 10*3/uL (ref 0.0–0.1)
Basophils Relative: 1 %
Eosinophils Absolute: 0.4 10*3/uL (ref 0.0–0.5)
Eosinophils Relative: 8 %
HCT: 43.8 % (ref 39.0–52.0)
Hemoglobin: 14.9 g/dL (ref 13.0–17.0)
Immature Granulocytes: 0 %
Lymphocytes Relative: 40 %
Lymphs Abs: 2 10*3/uL (ref 0.7–4.0)
MCH: 31 pg (ref 26.0–34.0)
MCHC: 34 g/dL (ref 30.0–36.0)
MCV: 91.3 fL (ref 80.0–100.0)
Monocytes Absolute: 0.4 10*3/uL (ref 0.1–1.0)
Monocytes Relative: 8 %
Neutro Abs: 2.1 10*3/uL (ref 1.7–7.7)
Neutrophils Relative %: 43 %
Platelet Count: 162 10*3/uL (ref 150–400)
RBC: 4.8 MIL/uL (ref 4.22–5.81)
RDW: 13.6 % (ref 11.5–15.5)
WBC Count: 5 10*3/uL (ref 4.0–10.5)
nRBC: 0 % (ref 0.0–0.2)

## 2022-09-14 NOTE — Progress Notes (Deleted)
  SUBJECTIVE:   CHIEF COMPLAINT / HPI:   BP Meds: amlodipine 5 mg, HCTZ 25 mg, olmesartan to 40 mg   PERTINENT  PMH / PSH: ***  Past Medical History:  Diagnosis Date   Acute pulmonary embolus (HCC)    bilateral submassive PE in setting of missing several doses of Xarelto for his DVT   Arthritis    DVT (deep venous thrombosis) (HCC) 11/28/2013   RT LEG   High cholesterol    Hypertension    Lumbar spine scoliosis    Marijuana use    Sleep apnea     Patient Care Team: Bess Kinds, MD as PCP - General (Family Medicine) Quintella Reichert, MD as PCP - Cardiology (Cardiology) OBJECTIVE:  There were no vitals taken for this visit. Physical Exam   ASSESSMENT/PLAN:  There are no diagnoses linked to this encounter. No follow-ups on file. Bess Kinds, MD 09/14/2022, 12:23 PM PGY-***, [facility] Family Medicine {    This will disappear when note is signed, click to select method of visit    :1}

## 2022-09-15 LAB — PROTEIN C ACTIVITY: Protein C Activity: 73 % (ref 73–180)

## 2022-09-16 LAB — PROTEIN C, TOTAL: Protein C, Total: 62 % (ref 60–150)

## 2022-09-25 ENCOUNTER — Other Ambulatory Visit (HOSPITAL_COMMUNITY): Payer: Self-pay

## 2022-09-25 ENCOUNTER — Ambulatory Visit (INDEPENDENT_AMBULATORY_CARE_PROVIDER_SITE_OTHER): Payer: Commercial Managed Care - PPO | Admitting: Student

## 2022-09-25 ENCOUNTER — Encounter: Payer: Self-pay | Admitting: Student

## 2022-09-25 VITALS — BP 90/66 | Temp 98.6°F | Ht 76.0 in | Wt 235.2 lb

## 2022-09-25 DIAGNOSIS — M545 Low back pain, unspecified: Secondary | ICD-10-CM | POA: Diagnosis not present

## 2022-09-25 MED ORDER — DULOXETINE HCL 30 MG PO CPEP
30.0000 mg | ORAL_CAPSULE | Freq: Every day | ORAL | 2 refills | Status: DC
Start: 2022-09-25 — End: 2023-01-01
  Filled 2022-09-25: qty 30, 30d supply, fill #0

## 2022-09-25 NOTE — Patient Instructions (Addendum)
It was wonderful to meet you today. Thank you for allowing me to be a part of your care. Below is a short summary of what we discussed at your visit today:  I think it is appropriate to restart your physical therapy.  I have also sent in prescription for Cymbalta which you will take daily  Please make sure to follow-up with your orthopedic appointment.  Please bring all of your medications to every appointment!  If you have any questions or concerns, please do not hesitate to contact us via phone or MyChart message.   Jerre Simon, MD [facility]

## 2022-09-25 NOTE — Progress Notes (Signed)
    SUBJECTIVE:   CHIEF COMPLAINT / HPI:   Patient is a 57 year old male presenting today for tramadol refill.He has had chronic lumbar pain that has required multiple surgeries.  He is currently being followed by orthopedic surgery.  Mickaela Starlin stated that he was previously doing well with physical therapy and tramadol.  However physical therapy was recently discontinued by the therapist due to concerns of his chronic DVT.  Since discontinuing his physical therapy patient reports he is having worsening back pain.  Describes his back pain as intermittent sharp, throbbing, and aching pain that presents daily.  Pain does not radiate to the lower extremities.  He denies any saddle paresthesia or incontinence.  Previously was on Toradol however chart review show Toradol was discontinued last year by sleep clinic due to concerns of central apnea.  PERTINENT  PMH / PSH: Reviewed   OBJECTIVE:   BP 90/66   Temp 98.6 F (37 C)   Ht 6\' 4"  (1.93 m)   Wt 235 lb 3.2 oz (106.7 kg)   SpO2 100%   BMI 28.63 kg/m    Physical Exam General: Alert, well appearing, NAD Cardiovascular: RRR, No Murmurs, Normal S2/S2 Respiratory: CTAB, No wheezing or Rales Abdomen: No distension or tenderness Extremities: No edema on extremities, walks with a cane. LE muscle strength is 5/5    ASSESSMENT/PLAN:   Lumbar back pain Patient with worsening chronic lower back pain which was well-managed with physical therapy and tramadol in the past.  Suspect he will most likely benefit from physical therapy and his history of DVT shouldn't be a contraindication for physical therapy. given the concerns for central apnea expressed by his sleep clinic and their recommendation to discontinue tramadol use, will avoid tramadol for pain control at this time.  Physical exam shows normal muscle strength bilaterally and seem to have adjusted well ambulating with his cane.  No red flag symptoms such as saddle paresthesia or bowel/urinary  incontinence. -Rx Cymbalta 30 mg daily -Recommend patient's continue his physical therapy as tolerated -Courage patient to follow-up with his orthopedic team -Discussed fall precautions with patient.       Jerre Simon, MD [facility]

## 2022-09-25 NOTE — Assessment & Plan Note (Addendum)
Patient with worsening chronic lower back pain which was well-managed with physical therapy and tramadol in the past.  Suspect he will most likely benefit from physical therapy and his history of DVT shouldn't be a contraindication for physical therapy. given the concerns for central apnea expressed by his sleep clinic and their recommendation to discontinue tramadol use, will avoid tramadol for pain control at this time.  Physical exam shows normal muscle strength bilaterally and seem to have adjusted well ambulating with his cane.  No red flag symptoms such as saddle paresthesia or bowel/urinary incontinence. -Rx Cymbalta 30 mg daily -Recommend patient's continue his physical therapy as tolerated -Courage patient to follow-up with his orthopedic team -Discussed fall precautions with patient.

## 2022-10-08 ENCOUNTER — Other Ambulatory Visit (HOSPITAL_COMMUNITY): Payer: Self-pay

## 2022-12-21 ENCOUNTER — Encounter (HOSPITAL_COMMUNITY)
Admission: EM | Disposition: A | Discharge status: Home / Self Care | Payer: Self-pay | Source: Home / Self Care | Attending: Family Medicine

## 2022-12-21 ENCOUNTER — Inpatient Hospital Stay (HOSPITAL_COMMUNITY)
Admission: EM | Admit: 2022-12-21 | Discharge: 2022-12-25 | DRG: 175 | Disposition: A | Discharge status: Home / Self Care | Payer: Commercial Managed Care - PPO | Attending: Family Medicine | Admitting: Family Medicine

## 2022-12-21 ENCOUNTER — Encounter (HOSPITAL_COMMUNITY): Payer: Self-pay

## 2022-12-21 ENCOUNTER — Emergency Department (HOSPITAL_COMMUNITY): Payer: Commercial Managed Care - PPO

## 2022-12-21 ENCOUNTER — Other Ambulatory Visit (HOSPITAL_COMMUNITY): Payer: Commercial Managed Care - PPO

## 2022-12-21 ENCOUNTER — Other Ambulatory Visit: Payer: Self-pay

## 2022-12-21 ENCOUNTER — Inpatient Hospital Stay (HOSPITAL_COMMUNITY): Payer: Commercial Managed Care - PPO

## 2022-12-21 DIAGNOSIS — K5900 Constipation, unspecified: Secondary | ICD-10-CM | POA: Diagnosis not present

## 2022-12-21 DIAGNOSIS — Z79899 Other long term (current) drug therapy: Secondary | ICD-10-CM

## 2022-12-21 DIAGNOSIS — D72829 Elevated white blood cell count, unspecified: Secondary | ICD-10-CM | POA: Diagnosis present

## 2022-12-21 DIAGNOSIS — R000 Tachycardia, unspecified: Secondary | ICD-10-CM | POA: Diagnosis present

## 2022-12-21 DIAGNOSIS — G4730 Sleep apnea, unspecified: Secondary | ICD-10-CM | POA: Diagnosis present

## 2022-12-21 DIAGNOSIS — J439 Emphysema, unspecified: Secondary | ICD-10-CM | POA: Diagnosis present

## 2022-12-21 DIAGNOSIS — Y92009 Unspecified place in unspecified non-institutional (private) residence as the place of occurrence of the external cause: Secondary | ICD-10-CM

## 2022-12-21 DIAGNOSIS — D696 Thrombocytopenia, unspecified: Secondary | ICD-10-CM | POA: Diagnosis not present

## 2022-12-21 DIAGNOSIS — Z813 Family history of other psychoactive substance abuse and dependence: Secondary | ICD-10-CM

## 2022-12-21 DIAGNOSIS — E871 Hypo-osmolality and hyponatremia: Secondary | ICD-10-CM | POA: Diagnosis present

## 2022-12-21 DIAGNOSIS — I10 Essential (primary) hypertension: Secondary | ICD-10-CM | POA: Diagnosis present

## 2022-12-21 DIAGNOSIS — T148XXA Other injury of unspecified body region, initial encounter: Secondary | ICD-10-CM | POA: Insufficient documentation

## 2022-12-21 DIAGNOSIS — Z8249 Family history of ischemic heart disease and other diseases of the circulatory system: Secondary | ICD-10-CM

## 2022-12-21 DIAGNOSIS — E7800 Pure hypercholesterolemia, unspecified: Secondary | ICD-10-CM | POA: Diagnosis present

## 2022-12-21 DIAGNOSIS — R079 Chest pain, unspecified: Secondary | ICD-10-CM | POA: Diagnosis not present

## 2022-12-21 DIAGNOSIS — Z825 Family history of asthma and other chronic lower respiratory diseases: Secondary | ICD-10-CM

## 2022-12-21 DIAGNOSIS — R7401 Elevation of levels of liver transaminase levels: Secondary | ICD-10-CM | POA: Diagnosis present

## 2022-12-21 DIAGNOSIS — Z7901 Long term (current) use of anticoagulants: Secondary | ICD-10-CM

## 2022-12-21 DIAGNOSIS — Z5941 Food insecurity: Secondary | ICD-10-CM

## 2022-12-21 DIAGNOSIS — I959 Hypotension, unspecified: Secondary | ICD-10-CM | POA: Diagnosis present

## 2022-12-21 DIAGNOSIS — I2699 Other pulmonary embolism without acute cor pulmonale: Principal | ICD-10-CM | POA: Diagnosis present

## 2022-12-21 DIAGNOSIS — D649 Anemia, unspecified: Secondary | ICD-10-CM | POA: Insufficient documentation

## 2022-12-21 DIAGNOSIS — T45615A Adverse effect of thrombolytic drugs, initial encounter: Secondary | ICD-10-CM | POA: Diagnosis not present

## 2022-12-21 DIAGNOSIS — I2609 Other pulmonary embolism with acute cor pulmonale: Secondary | ICD-10-CM | POA: Diagnosis not present

## 2022-12-21 DIAGNOSIS — N179 Acute kidney failure, unspecified: Secondary | ICD-10-CM | POA: Diagnosis not present

## 2022-12-21 DIAGNOSIS — Z8261 Family history of arthritis: Secondary | ICD-10-CM

## 2022-12-21 DIAGNOSIS — L7632 Postprocedural hematoma of skin and subcutaneous tissue following other procedure: Secondary | ICD-10-CM | POA: Diagnosis not present

## 2022-12-21 DIAGNOSIS — Y838 Other surgical procedures as the cause of abnormal reaction of the patient, or of later complication, without mention of misadventure at the time of the procedure: Secondary | ICD-10-CM | POA: Diagnosis not present

## 2022-12-21 DIAGNOSIS — D62 Acute posthemorrhagic anemia: Secondary | ICD-10-CM | POA: Diagnosis not present

## 2022-12-21 DIAGNOSIS — J9601 Acute respiratory failure with hypoxia: Secondary | ICD-10-CM | POA: Diagnosis present

## 2022-12-21 DIAGNOSIS — Z96653 Presence of artificial knee joint, bilateral: Secondary | ICD-10-CM | POA: Diagnosis present

## 2022-12-21 DIAGNOSIS — I2721 Secondary pulmonary arterial hypertension: Secondary | ICD-10-CM | POA: Diagnosis present

## 2022-12-21 DIAGNOSIS — R739 Hyperglycemia, unspecified: Secondary | ICD-10-CM | POA: Diagnosis present

## 2022-12-21 DIAGNOSIS — Z841 Family history of disorders of kidney and ureter: Secondary | ICD-10-CM

## 2022-12-21 DIAGNOSIS — I700 Atherosclerosis of aorta: Secondary | ICD-10-CM | POA: Diagnosis not present

## 2022-12-21 DIAGNOSIS — R748 Abnormal levels of other serum enzymes: Secondary | ICD-10-CM | POA: Diagnosis not present

## 2022-12-21 DIAGNOSIS — I2489 Other forms of acute ischemic heart disease: Secondary | ICD-10-CM | POA: Diagnosis present

## 2022-12-21 DIAGNOSIS — I82501 Chronic embolism and thrombosis of unspecified deep veins of right lower extremity: Secondary | ICD-10-CM | POA: Diagnosis present

## 2022-12-21 DIAGNOSIS — Z91128 Patient's intentional underdosing of medication regimen for other reason: Secondary | ICD-10-CM

## 2022-12-21 DIAGNOSIS — F17290 Nicotine dependence, other tobacco product, uncomplicated: Secondary | ICD-10-CM | POA: Diagnosis present

## 2022-12-21 DIAGNOSIS — Z981 Arthrodesis status: Secondary | ICD-10-CM

## 2022-12-21 DIAGNOSIS — E872 Acidosis, unspecified: Secondary | ICD-10-CM | POA: Diagnosis present

## 2022-12-21 DIAGNOSIS — E876 Hypokalemia: Secondary | ICD-10-CM | POA: Diagnosis present

## 2022-12-21 DIAGNOSIS — Z811 Family history of alcohol abuse and dependence: Secondary | ICD-10-CM

## 2022-12-21 DIAGNOSIS — Z818 Family history of other mental and behavioral disorders: Secondary | ICD-10-CM

## 2022-12-21 DIAGNOSIS — R0602 Shortness of breath: Secondary | ICD-10-CM | POA: Diagnosis not present

## 2022-12-21 DIAGNOSIS — Z832 Family history of diseases of the blood and blood-forming organs and certain disorders involving the immune mechanism: Secondary | ICD-10-CM

## 2022-12-21 DIAGNOSIS — T45516A Underdosing of anticoagulants, initial encounter: Secondary | ICD-10-CM | POA: Diagnosis present

## 2022-12-21 DIAGNOSIS — Z9889 Other specified postprocedural states: Secondary | ICD-10-CM | POA: Diagnosis not present

## 2022-12-21 HISTORY — PX: IR INFUSION THROMBOL ARTERIAL INITIAL (MS): IMG5376

## 2022-12-21 HISTORY — PX: IR ANGIOGRAM SELECTIVE EACH ADDITIONAL VESSEL: IMG667

## 2022-12-21 HISTORY — PX: IR US GUIDE VASC ACCESS RIGHT: IMG2390

## 2022-12-21 HISTORY — PX: IR ANGIOGRAM PULMONARY BILATERAL SELECTIVE: IMG664

## 2022-12-21 LAB — TROPONIN I (HIGH SENSITIVITY)
Troponin I (High Sensitivity): 1011 ng/L (ref <18)
Troponin I (High Sensitivity): 1057 ng/L (ref <18)

## 2022-12-21 LAB — HIV ANTIBODY (ROUTINE TESTING W REFLEX): HIV Screen 4th Generation wRfx: NONREACTIVE

## 2022-12-21 LAB — CBC WITH DIFFERENTIAL/PLATELET
Abs Immature Granulocytes: 0.1 10*3/uL — ABNORMAL HIGH (ref 0.00–0.07)
Basophils Absolute: 0 10*3/uL (ref 0.0–0.1)
Basophils Relative: 0 %
Eosinophils Absolute: 0.1 10*3/uL (ref 0.0–0.5)
Eosinophils Relative: 0 %
HCT: 45.8 % (ref 39.0–52.0)
Hemoglobin: 15.3 g/dL (ref 13.0–17.0)
Immature Granulocytes: 1 %
Lymphocytes Relative: 15 %
Lymphs Abs: 1.7 10*3/uL (ref 0.7–4.0)
MCH: 32.9 pg (ref 26.0–34.0)
MCHC: 33.4 g/dL (ref 30.0–36.0)
MCV: 98.5 fL (ref 80.0–100.0)
Monocytes Absolute: 1 10*3/uL (ref 0.1–1.0)
Monocytes Relative: 8 %
Neutro Abs: 8.5 10*3/uL — ABNORMAL HIGH (ref 1.7–7.7)
Neutrophils Relative %: 76 %
Platelets: 120 10*3/uL — ABNORMAL LOW (ref 150–400)
RBC: 4.65 MIL/uL (ref 4.22–5.81)
RDW: 15.1 % (ref 11.5–15.5)
WBC: 11.4 10*3/uL — ABNORMAL HIGH (ref 4.0–10.5)
nRBC: 0 % (ref 0.0–0.2)

## 2022-12-21 LAB — COMPREHENSIVE METABOLIC PANEL
ALT: 105 U/L — ABNORMAL HIGH (ref 0–44)
AST: 205 U/L — ABNORMAL HIGH (ref 15–41)
Albumin: 3.5 g/dL (ref 3.5–5.0)
Alkaline Phosphatase: 101 U/L (ref 38–126)
Anion gap: 17 — ABNORMAL HIGH (ref 5–15)
BUN: 16 mg/dL (ref 6–20)
CO2: 21 mmol/L — ABNORMAL LOW (ref 22–32)
Calcium: 8.7 mg/dL — ABNORMAL LOW (ref 8.9–10.3)
Chloride: 95 mmol/L — ABNORMAL LOW (ref 98–111)
Creatinine, Ser: 1.59 mg/dL — ABNORMAL HIGH (ref 0.61–1.24)
GFR, Estimated: 50 mL/min — ABNORMAL LOW (ref >60)
Glucose, Bld: 316 mg/dL — ABNORMAL HIGH (ref 70–99)
Potassium: 2.6 mmol/L — CL (ref 3.5–5.1)
Sodium: 133 mmol/L — ABNORMAL LOW (ref 135–145)
Total Bilirubin: 0.7 mg/dL (ref 0.3–1.2)
Total Protein: 6.9 g/dL (ref 6.5–8.1)

## 2022-12-21 LAB — BASIC METABOLIC PANEL
Anion gap: 14 (ref 5–15)
BUN: 15 mg/dL (ref 6–20)
CO2: 21 mmol/L — ABNORMAL LOW (ref 22–32)
Calcium: 8.4 mg/dL — ABNORMAL LOW (ref 8.9–10.3)
Chloride: 99 mmol/L (ref 98–111)
Creatinine, Ser: 1.35 mg/dL — ABNORMAL HIGH (ref 0.61–1.24)
GFR, Estimated: >60 mL/min (ref >60)
Glucose, Bld: 190 mg/dL — ABNORMAL HIGH (ref 70–99)
Potassium: 3.4 mmol/L — ABNORMAL LOW (ref 3.5–5.1)
Sodium: 134 mmol/L — ABNORMAL LOW (ref 135–145)

## 2022-12-21 LAB — I-STAT CHEM 8, ED
BUN: 17 mg/dL (ref 6–20)
Calcium, Ion: 0.98 mmol/L — ABNORMAL LOW (ref 1.15–1.40)
Chloride: 97 mmol/L — ABNORMAL LOW (ref 98–111)
Creatinine, Ser: 1.5 mg/dL — ABNORMAL HIGH (ref 0.61–1.24)
Glucose, Bld: 306 mg/dL — ABNORMAL HIGH (ref 70–99)
HCT: 49 % (ref 39.0–52.0)
Hemoglobin: 16.7 g/dL (ref 13.0–17.0)
Potassium: 2.6 mmol/L — CL (ref 3.5–5.1)
Sodium: 136 mmol/L (ref 135–145)
TCO2: 18 mmol/L — ABNORMAL LOW (ref 22–32)

## 2022-12-21 LAB — ECHOCARDIOGRAM COMPLETE
Area-P 1/2: 3.54 cm2
Height: 76 in
S' Lateral: 1.6 cm
Weight: 3763.69 oz

## 2022-12-21 LAB — HEPARIN LEVEL (UNFRACTIONATED)
Heparin Unfractionated: 0.46 IU/mL (ref 0.30–0.70)
Heparin Unfractionated: 0.23 IU/mL — ABNORMAL LOW (ref 0.30–0.70)

## 2022-12-21 LAB — CBC
HCT: 40.4 % (ref 39.0–52.0)
HCT: 35.9 % — ABNORMAL LOW (ref 39.0–52.0)
Hemoglobin: 14 g/dL (ref 13.0–17.0)
Hemoglobin: 12.2 g/dL — ABNORMAL LOW (ref 13.0–17.0)
MCH: 33.2 pg (ref 26.0–34.0)
MCH: 32.3 pg (ref 26.0–34.0)
MCHC: 34.7 g/dL (ref 30.0–36.0)
MCHC: 34 g/dL (ref 30.0–36.0)
MCV: 95.7 fL (ref 80.0–100.0)
MCV: 95 fL (ref 80.0–100.0)
Platelets: 110 10*3/uL — ABNORMAL LOW (ref 150–400)
Platelets: 116 10*3/uL — ABNORMAL LOW (ref 150–400)
RBC: 4.22 MIL/uL (ref 4.22–5.81)
RBC: 3.78 MIL/uL — ABNORMAL LOW (ref 4.22–5.81)
RDW: 15.2 % (ref 11.5–15.5)
RDW: 15.3 % (ref 11.5–15.5)
WBC: 7.9 10*3/uL (ref 4.0–10.5)
WBC: 8.6 10*3/uL (ref 4.0–10.5)
nRBC: 0 % (ref 0.0–0.2)
nRBC: 0 % (ref 0.0–0.2)

## 2022-12-21 LAB — CBG MONITORING, ED: Glucose-Capillary: 191 mg/dL — ABNORMAL HIGH (ref 70–99)

## 2022-12-21 LAB — APTT
aPTT: 93 seconds — ABNORMAL HIGH (ref 24–36)
aPTT: 59 seconds — ABNORMAL HIGH (ref 24–36)

## 2022-12-21 LAB — GLUCOSE, CAPILLARY
Glucose-Capillary: 141 mg/dL — ABNORMAL HIGH (ref 70–99)
Glucose-Capillary: 162 mg/dL — ABNORMAL HIGH (ref 70–99)
Glucose-Capillary: 131 mg/dL — ABNORMAL HIGH (ref 70–99)
Glucose-Capillary: 77 mg/dL (ref 70–99)

## 2022-12-21 LAB — FIBRINOGEN: Fibrinogen: 378 mg/dL (ref 210–475)

## 2022-12-21 LAB — HEMOGLOBIN A1C
Hgb A1c MFr Bld: 5.2 % (ref 4.8–5.6)
Mean Plasma Glucose: 102.54 mg/dL

## 2022-12-21 LAB — LIPASE, BLOOD: Lipase: 50 U/L (ref 11–51)

## 2022-12-21 LAB — BRAIN NATRIURETIC PEPTIDE: B Natriuretic Peptide: 110.3 pg/mL — ABNORMAL HIGH (ref 0.0–100.0)

## 2022-12-21 LAB — MRSA NEXT GEN BY PCR, NASAL: MRSA by PCR Next Gen: NOT DETECTED

## 2022-12-21 LAB — LACTIC ACID, PLASMA
Lactic Acid, Venous: 3.9 mmol/L (ref 0.5–1.9)
Lactic Acid, Venous: 2.6 mmol/L (ref 0.5–1.9)

## 2022-12-21 SURGERY — LEFT HEART CATH AND CORONARY ANGIOGRAPHY
Anesthesia: LOCAL

## 2022-12-21 MED ORDER — LIDOCAINE HCL 1 % IJ SOLN
INTRAMUSCULAR | Status: AC
  Filled 2022-12-21: qty 20

## 2022-12-21 MED ORDER — SODIUM CHLORIDE 0.9% FLUSH
3.0000 mL | INTRAVENOUS | Status: DC | PRN
  Administered 2022-12-25: 3 mL via INTRAVENOUS

## 2022-12-21 MED ORDER — IOHEXOL 350 MG/ML SOLN
75.0000 mL | Freq: Once | INTRAVENOUS | Status: AC | PRN
  Administered 2022-12-21: 75 mL via INTRAVENOUS

## 2022-12-21 MED ORDER — FENTANYL CITRATE (PF) 100 MCG/2ML IJ SOLN
INTRAMUSCULAR | Status: AC
  Filled 2022-12-21: qty 2

## 2022-12-21 MED ORDER — DOCUSATE SODIUM 100 MG PO CAPS: 100.0000 mg | ORAL_CAPSULE | Freq: Two times a day (BID) | ORAL | Status: DC | PRN

## 2022-12-21 MED ORDER — POTASSIUM CHLORIDE CRYS ER 20 MEQ PO TBCR
40.0000 meq | EXTENDED_RELEASE_TABLET | Freq: Once | ORAL | Status: AC
  Administered 2022-12-21: 40 meq via ORAL
  Filled 2022-12-21: qty 2

## 2022-12-21 MED ORDER — INSULIN ASPART 100 UNIT/ML IJ SOLN
0.0000 [IU] | INTRAMUSCULAR | Status: DC
  Administered 2022-12-21: 2 [IU] via SUBCUTANEOUS
  Administered 2022-12-21: 3 [IU] via SUBCUTANEOUS
  Administered 2022-12-21: 2 [IU] via SUBCUTANEOUS
  Administered 2022-12-21: 3 [IU] via SUBCUTANEOUS
  Administered 2022-12-22: 2 [IU] via SUBCUTANEOUS

## 2022-12-21 MED ORDER — SODIUM CHLORIDE 0.9 % IV SOLN: INTRAVENOUS | Status: DC

## 2022-12-21 MED ORDER — MIDAZOLAM HCL 2 MG/2ML IJ SOLN
INTRAMUSCULAR | Status: AC
  Filled 2022-12-21: qty 2

## 2022-12-21 MED ORDER — SODIUM CHLORIDE 0.9 % IV SOLN
12.0000 mg | Freq: Once | INTRAVENOUS | Status: AC
  Administered 2022-12-21: 12 mg via INTRAVENOUS
  Filled 2022-12-21: qty 12

## 2022-12-21 MED ORDER — SODIUM CHLORIDE 0.9 % IV SOLN: 250.0000 mL | INTRAVENOUS | Status: DC | PRN

## 2022-12-21 MED ORDER — CHLORHEXIDINE GLUCONATE CLOTH 2 % EX PADS
6.0000 | MEDICATED_PAD | Freq: Every day | CUTANEOUS | Status: DC
  Administered 2022-12-21 – 2022-12-25 (×5): 6 via TOPICAL

## 2022-12-21 MED ORDER — MAGNESIUM SULFATE 2 GM/50ML IV SOLN
2.0000 g | Freq: Once | INTRAVENOUS | Status: AC
  Administered 2022-12-21: 2 g via INTRAVENOUS
  Filled 2022-12-21: qty 50

## 2022-12-21 MED ORDER — ONDANSETRON HCL 4 MG/2ML IJ SOLN
INTRAMUSCULAR | Status: AC | PRN
  Administered 2022-12-21: 4 mg via INTRAVENOUS

## 2022-12-21 MED ORDER — LACTATED RINGERS IV BOLUS
1000.0000 mL | Freq: Once | INTRAVENOUS | Status: AC
  Administered 2022-12-21: 1000 mL via INTRAVENOUS

## 2022-12-21 MED ORDER — MIDAZOLAM HCL 2 MG/2ML IJ SOLN
INTRAMUSCULAR | Status: AC | PRN
Start: 2022-12-21 — End: 2022-12-21
  Administered 2022-12-21: .5 mg via INTRAVENOUS
  Administered 2022-12-21: 1 mg via INTRAVENOUS

## 2022-12-21 MED ORDER — FENTANYL CITRATE (PF) 100 MCG/2ML IJ SOLN
INTRAMUSCULAR | Status: AC | PRN
Start: 2022-12-21 — End: 2022-12-21
  Administered 2022-12-21: 50 ug via INTRAVENOUS
  Administered 2022-12-21: 25 ug via INTRAVENOUS

## 2022-12-21 MED ORDER — POLYETHYLENE GLYCOL 3350 17 G PO PACK: 17.0000 g | PACK | Freq: Every day | ORAL | Status: DC | PRN

## 2022-12-21 MED ORDER — HEPARIN (PORCINE) 25000 UT/250ML-% IV SOLN
1750.0000 [IU]/h | INTRAVENOUS | Status: DC
  Administered 2022-12-21: 1800 [IU]/h via INTRAVENOUS
  Administered 2022-12-21 – 2022-12-22 (×2): 1600 [IU]/h via INTRAVENOUS
  Administered 2022-12-23: 1650 [IU]/h via INTRAVENOUS
  Administered 2022-12-23: 1600 [IU]/h via INTRAVENOUS
  Administered 2022-12-24: 1750 [IU]/h via INTRAVENOUS
  Filled 2022-12-21 (×6): qty 250

## 2022-12-21 MED ORDER — IOHEXOL 300 MG/ML  SOLN
100.0000 mL | Freq: Once | INTRAMUSCULAR | Status: AC | PRN
  Administered 2022-12-21: 40 mL via INTRAVENOUS

## 2022-12-21 MED ORDER — MORPHINE SULFATE (PF) 4 MG/ML IV SOLN
4.0000 mg | Freq: Once | INTRAVENOUS | Status: AC
  Administered 2022-12-21: 4 mg via INTRAVENOUS
  Filled 2022-12-21: qty 1

## 2022-12-21 MED ORDER — HEPARIN BOLUS VIA INFUSION
4000.0000 [IU] | Freq: Once | INTRAVENOUS | Status: AC
  Administered 2022-12-21: 4000 [IU] via INTRAVENOUS
  Filled 2022-12-21: qty 4000

## 2022-12-21 MED ORDER — ONDANSETRON HCL 4 MG/2ML IJ SOLN
INTRAMUSCULAR | Status: AC
  Filled 2022-12-21: qty 2

## 2022-12-21 MED ORDER — DILTIAZEM HCL 25 MG/5ML IV SOLN
10.0000 mg | Freq: Once | INTRAVENOUS | Status: AC
  Administered 2022-12-21: 10 mg via INTRAVENOUS
  Filled 2022-12-21: qty 5

## 2022-12-21 MED ORDER — SODIUM CHLORIDE 0.9% FLUSH
3.0000 mL | Freq: Two times a day (BID) | INTRAVENOUS | Status: DC
  Administered 2022-12-21 – 2022-12-25 (×9): 3 mL via INTRAVENOUS

## 2022-12-21 MED ORDER — NITROGLYCERIN 0.4 MG SL SUBL
0.4000 mg | SUBLINGUAL_TABLET | Freq: Once | SUBLINGUAL | Status: DC
  Filled 2022-12-21: qty 1

## 2022-12-21 MED ORDER — ASPIRIN 81 MG PO CHEW
324.0000 mg | CHEWABLE_TABLET | Freq: Once | ORAL | Status: AC
  Administered 2022-12-21: 324 mg via ORAL
  Filled 2022-12-21: qty 4

## 2022-12-21 MED ORDER — POTASSIUM CHLORIDE 10 MEQ/100ML IV SOLN
10.0000 meq | INTRAVENOUS | Status: AC
  Administered 2022-12-21 (×3): 10 meq via INTRAVENOUS
  Filled 2022-12-21 (×3): qty 100

## 2022-12-21 MED ORDER — ORAL CARE MOUTH RINSE: 15.0000 mL | OROMUCOSAL | Status: DC | PRN

## 2022-12-21 NOTE — Progress Notes (Signed)
ANTICOAGULATION CONSULT NOTE `  Pharmacy Consult for IV heparin  Indication: pulmonary embolus  No Known Allergies  Patient Measurements: Height: 6\' 4"  (193 cm) Weight: 106.7 kg (235 lb 3.7 oz) IBW/kg (Calculated) : 86.8 Heparin Dosing Weight: 106.7 kg  Vital Signs: Temp: 97.4 F (36.3 C) (09/23 0539) Temp Source: Oral (09/23 0539) BP: 100/68 (09/23 0615) Pulse Rate: 125 (09/23 0615)  Labs: Recent Labs    12/21/22 0557 12/21/22 0603  HGB 15.3 16.7  HCT 45.8 49.0  PLT 120*  --   CREATININE  --  1.50*    Estimated Creatinine Clearance: 71.1 mL/min (A) (by C-G formula based on SCr of 1.5 mg/dL (H)).   Medical History: Past Medical History:  Diagnosis Date   Acute pulmonary embolus (HCC)    bilateral submassive PE in setting of missing several doses of Xarelto for his DVT   Arthritis    DVT (deep venous thrombosis) (HCC) 11/28/2013   RT LEG   High cholesterol    Hypertension    Lumbar spine scoliosis    Marijuana use    Sleep apnea    Assessment: Jeffrey [name] is a 57 y.o. year old male admitted on 12/21/2022 with concern for PE. Eliquis prior to admission (LD 8/22 AM). Of note patient reports taking eliquis once daily instead of twice daily. Pharmacy consulted to dose heparin.   Goal of Therapy:  Heparin level 0.3-0.7 units/ml Monitor platelets by anticoagulation protocol: Yes   Plan:  Heparin 4000 units x 1 as bolus followed by heparin infusion at 1600 units/hr 6h aPTT  Daily aPTT, heparin level, CBC, and monitoring for bleeding F/u plans for anticoagulation. Likely can resume eliquis in future with education for dosing.    Thank you for allowing pharmacy to participate in this patient's care.  Marja Kays, PharmD Emergency Medicine Clinical Pharmacist 12/21/2022,6:46 AM

## 2022-12-21 NOTE — Consult Note (Signed)
Chief Complaint: Pulmonary Embolus  Patient was seen in consultation today for   at the request of Dr. Merrily Pew  Supervising Physician: Roanna Banning  Patient Status: [facility] - ED  History of Present Illness: Jeffrey [name] is a 57 y.o. male With a Known history significant for prior PE's amd DVT's on Eliquis for management.  Present admit to ED with a two day history  of progressive SOB and decompensation, chest pain and syncope today.  On presentation, he was found to have elevated troponins with EKGs changes concerning for possible cardiac event.  CTA Chest also showed: 1. Positive for acute PE with CT evidence of right heart strain (RV/LV Ratio = 2.05) consistent with at least submassive (intermediate risk) PE. The presence of right heart strain has been associated with an increased risk of morbidity and mortality. Please refer to the "Code PE Focused" order set in EPIC.  Cardiac enzymes monitored with increased LA level.  CCM recommending proceed with pulmonary lysis.  Case reviewed and approved by Dr. Milford Cage once patient cleared by cardiology.    Past Medical History:  Diagnosis Date   Acute pulmonary embolus (HCC)    bilateral submassive PE in setting of missing several doses of Xarelto for his DVT   Arthritis    DVT (deep venous thrombosis) (HCC) 11/28/2013   RT LEG   High cholesterol    Hypertension    Lumbar spine scoliosis    Marijuana use    Sleep apnea     Past Surgical History:  Procedure Laterality Date   ABDOMINAL EXPOSURE N/A 01/14/2022   Procedure: ABDOMINAL EXPOSURE;  Surgeon: Cephus Shelling, MD;  [facility];  Service: Vascular;  Laterality: N/A;   ANTERIOR LUMBAR FUSION Left 01/14/2022   Procedure: Lumbar three-four Lumbar four-five Oblique Lumbar Interbody Fusion;  Surgeon: Jadene Pierini, MD;  Location: [facility] OR;  Service: Neurosurgery;  Laterality: Left;   HERNIA REPAIR     2005 and 2011; umbilical hernia repair   KNEE SURGERY Bilateral     Left knee 1993, right knee 2004   TONSILLECTOMY     TOTAL KNEE ARTHROPLASTY Left 02/10/2017   Procedure: LEFT TOTAL KNEE ARTHROPLASTY;  Surgeon: Tarry Kos, MD;  Location: [facility] OR;  Service: Orthopedics;  Laterality: Left;   TOTAL KNEE ARTHROPLASTY Right 07/28/2017   Procedure: RIGHT TOTAL KNEE ARTHROPLASTY;  Surgeon: Tarry Kos, MD;  Location: [facility] OR;  Service: Orthopedics;  Laterality: Right;   TRANSFORAMINAL LUMBAR INTERBODY FUSION W/ MIS 1 LEVEL N/A 01/14/2022   Procedure: OPEN TRANSFORAMINAL LUMBAR INTERBODY FUSION  LUMBAR FIVE-SACRAL ONE ,LAMINECTOMIES AND POSTERIOR LATERAL INSTRUMENTATION FUSION  LUMBAR THREE TO FIVE SACRAL ONE;  Surgeon: Jadene Pierini, MD;  Location: [facility] OR;  Service: Neurosurgery;  Laterality: N/A;    Allergies: Patient has no known allergies.  Medications: Prior to Admission medications   Medication Sig Start Date End Date Taking? Authorizing Provider  amLODipine (NORVASC) 5 MG tablet Take 1 tablet (5 mg total) by mouth at bedtime. 02/23/22  Yes Sabino Dick, DO  apixaban (ELIQUIS) 5 MG TABS tablet Take 1 tablet (5 mg total) by mouth 2 (two) times daily. 02/27/22  Yes Sowell, Apolinar Junes, MD  atorvastatin (LIPITOR) 40 MG tablet TAKE 1 TABLET BY MOUTH ONCE DAILY 12/05/21 01/10/23 Yes Sowell, Apolinar Junes, MD  DULoxetine (CYMBALTA) 30 MG capsule Take 1 capsule (30 mg total) by mouth daily. 09/25/22 12/24/22 Yes Jerre Simon, MD  hydrochlorothiazide (HYDRODIURIL) 25 MG tablet Take 1 tablet (25 mg total) by  mouth daily. 08/18/22  Yes Espinoza, Myrlene Broker, DO  olmesartan (BENICAR) 40 MG tablet Take 1 tablet (40 mg total) by mouth at bedtime. 08/18/22  Yes Sabino Dick, DO  NON FORMULARY Pt uses a cpap nighlty Patient not taking: Reported on 12/21/2022    [provider]     Family History  Problem Relation Age of Onset   Heart disease Other    Arthritis Other    Alcohol abuse Mother    Heart disease Mother    Depression Mother    Hypertension  Mother    Kidney disease Mother    Arthritis Mother    Clotting disorder Mother    Arthritis Father    Alcohol abuse Brother    Drug abuse Brother    Sickle cell anemia Brother    Arthritis Maternal Grandmother    Heart disease Maternal Grandmother    Arthritis Maternal Grandfather    Heart disease Maternal Grandfather    Asthma Cousin     Social History   Socioeconomic History   Marital status: Married    Spouse name: Not on file   Number of children: Not on file   Years of education: Not on file   Highest education level: Not on file  Occupational History   Not on file  Tobacco Use   Smoking status: Every Day    Types: Cigars   Smokeless tobacco: Never   Tobacco comments:    Stopped for up to 5 years total - Peak rate 2ppd  Vaping Use   Vaping status: Former  Substance and Sexual Activity   Alcohol use: Yes    Alcohol/week: 12.0 standard drinks of alcohol    Types: 12 Cans of beer per week    Comment: seldom wine/liquor   Drug use: Yes    Types: Marijuana    Comment: Cocaine last used in 2004, current marijuana use   Sexual activity: Yes    Birth control/protection: None    Comment: With wife  Other Topics Concern   Not on file  Social History Narrative   Lives in [place].   Chickens as pet.    Hobbies: Editor, commissioning Pulmonary (11/24/16):   Originally from [place]. Has worked in Holiday representative, Personnel officer, Airline pilot, & in a warehouse. No known asbestos exposure. Previously had chickens as pets. No mold exposure.    Social Determinants of Health   Financial Resource Strain: Not on file  Food Insecurity: Food Insecurity Present (01/14/2022)   Hunger Vital Sign    Worried About Running Out of Food in the Last Year: Sometimes true    Ran Out of Food in the Last Year: Sometimes true  Transportation Needs: No Transportation Needs (01/14/2022)   PRAPARE - Administrator, Civil Service (Medical): No    Lack of Transportation (Non-Medical): No   Physical Activity: Not on file  Stress: Not on file  Social Connections: Not on file     Review of Systems: A 12 point ROS discussed and pertinent positives are indicated in the HPI above.  All other systems are negative.  Review of Systems  Constitutional:  Positive for activity change and fatigue.  Respiratory:  Positive for shortness of breath.   Cardiovascular:  Positive for chest pain.  Gastrointestinal:  Negative for abdominal pain, nausea and vomiting.  Genitourinary:  Negative for dysuria.  Musculoskeletal:  Negative for back pain.  Neurological:  Positive for syncope.  Psychiatric/Behavioral:  Negative for behavioral problems and  confusion.     Vital Signs: BP 100/85   Pulse (!) 109   Temp 98.2 F (36.8 C) (Oral)   Resp 20   Ht 6\' 4"  (1.93 m)   Wt 235 lb 3.7 oz (106.7 kg)   SpO2 100%   BMI 28.63 kg/m     Physical Exam Vitals and nursing note reviewed.  Constitutional:      Appearance: He is well-developed.  Cardiovascular:     Rate and Rhythm: Regular rhythm. Tachycardia present.  Pulmonary:     Effort: Pulmonary effort is normal. No respiratory distress.     Breath sounds: Normal breath sounds.     Comments: Conversing easily Musculoskeletal:     Right lower leg: No tenderness. No edema.     Left lower leg: No tenderness. No edema.  Skin:    General: Skin is warm and dry.  Neurological:     General: No focal deficit present.     Mental Status: He is alert and oriented to person, place, and time.  Psychiatric:        Mood and Affect: Mood normal.        Behavior: Behavior normal.     Imaging: CT Angio Chest PE W and/or Wo Contrast  Result Date: 12/21/2022 CLINICAL DATA:  Pulmonary embolism suspected. Shortness of breath. Tachycardia. EXAM: CT ANGIOGRAPHY CHEST WITH CONTRAST TECHNIQUE: Multidetector CT imaging of the chest was performed using the standard protocol during bolus administration of intravenous contrast. Multiplanar CT image  reconstructions and MIPs were obtained to evaluate the vascular anatomy. RADIATION DOSE REDUCTION: This exam was performed according to the departmental dose-optimization program which includes automated exposure control, adjustment of the mA and/or kV according to patient size and/or use of iterative reconstruction technique. CONTRAST:  75mL OMNIPAQUE IOHEXOL 350 MG/ML SOLN COMPARISON:  08/02/2022 FINDINGS: Cardiovascular: Heart size is normal. However, there is right ventricular strain with ratio of 2.05. There is massive bilateral pulmonary embolism. The aorta shows mild atherosclerotic change. Mediastinum/Nodes: No mass or lymphadenopathy. Lungs/Pleura: The lungs are clear.  No pleural effusion. Upper Abdomen: Mild fatty change of the liver. Musculoskeletal: Ordinary degenerative change of the spine. Review of the MIP images confirms the above findings. IMPRESSION: 1. Positive for acute PE with CT evidence of right heart strain (RV/LV Ratio = 2.05) consistent with at least submassive (intermediate risk) PE. The presence of right heart strain has been associated with an increased risk of morbidity and mortality. Please refer to the "Code PE Focused" order set in EPIC. 2. Critical Value/emergent results were called by telephone at the time of interpretation on 12/21/2022 at 6:55 am to provider [facility] , who verbally acknowledged these results. Aortic Atherosclerosis (ICD10-I70.0). Electronically Signed   By: Paulina Fusi M.D.   On: 12/21/2022 06:57   DG Chest Portable 1 View  Result Date: 12/21/2022 CLINICAL DATA:  Chest pain and shortness of breath. EXAM: PORTABLE CHEST 1 VIEW COMPARISON:  08/02/2022 FINDINGS: Heart size and mediastinal contours are unremarkable. No pleural fluid, interstitial edema or airspace disease. Visualized osseous structures are unremarkable. IMPRESSION: Negative portable chest. Electronically Signed   By: Signa Kell M.D.   On: 12/21/2022 06:33    Labs:  CBC: Recent  Labs    02/23/22 1422 08/02/22 1830 09/14/22 0918 12/21/22 0557 12/21/22 0603  WBC 6.0 8.3 5.0 11.4*  --   HGB 13.3 14.1 14.9 15.3 16.7  HCT 39.7 41.3 43.8 45.8 49.0  PLT 196 142* 162 120*  --  COAGS: Recent Labs    12/21/22 0653  APTT 93*    BMP: Recent Labs    01/06/22 0948 01/14/22 1434 08/02/22 1830 08/27/22 1200 09/14/22 0918 12/21/22 0557 12/21/22 0603  NA 136   < > 133* 135 132* 133* 136  K 4.0   < > 3.5 4.0 3.6 2.6* 2.6*  CL 105   < > 95* 93* 100 95* 97*  CO2 25   < > 23 24 20* 21*  --   GLUCOSE 108*   < > 88 107* 86 316* 306*  BUN 6   < > 6 11 16 16 17   CALCIUM 8.8*   < > 8.7* 9.8 9.0 8.7*  --   CREATININE 0.90   < > 0.75 0.99 0.83 1.59* 1.50*  GFRNONAA >60  --  >60  --  >60 50*  --    < > = values in this interval not displayed.    LIVER FUNCTION TESTS: Recent Labs    01/06/22 0948 02/23/22 1422 09/14/22 0918 12/21/22 0557  BILITOT 0.7 0.2 0.4 0.7  AST 59* 16 21 205*  ALT 58* 13 22 105*  ALKPHOS 64 112 67 101  PROT 6.9 6.6 7.0 6.9  ALBUMIN 3.7 4.0 3.9 3.5    TUMOR MARKERS: No results for input(s): "AFPTM", "CEA", "CA199", "CHROMGRNA" in the last 8760 hours.  Assessment and Plan: Pulmonary embolism Patent alert resting in bed. Reported he has been taking Eliquis for anticoagulation due to history of DVT/PE, but realized he was not taking  medication as prescribed- only taking once per day.  CT imaging showed large pulmonary embolism with RV strain, positive troponin leak.  IR consulted for possible intervention by Dr. Merrily Pew. Approval to proceed with Pulmonay lysis has been obtained from Cardiology and IR Dr Milford Cage.  PA to bedside to discuss with patient and family.  They are aware of the goals of the procedure as well risks and benefits.  They are in agreement to proceed.   Risks and benefits discussed with the patient including, but not limited to bleeding, possible life threatening bleeding and need for blood product transfusion,  vascular injury, stroke, contrast induced renal failure, limb loss and infection.  All of the patient's questions were answered, patient is agreeable to proceed. Consent signed and in chart.   Thank you for this interesting consult.  I greatly enjoyed meeting RESEAN KAPPLE and look forward to participating in their care.  A copy of this report was sent to the requesting provider on this date.  Electronically Signed: Hoyt Koch, PA 12/21/2022, 10:15 AM   I spent a total of 40 Minutes   in face to face in clinical consultation, greater than 50% of which was counseling/coordinating care for Pulmonary Embolus

## 2022-12-21 NOTE — ED Notes (Signed)
Called IR, they advised they would likely get patient around 10

## 2022-12-21 NOTE — Sedation Documentation (Signed)
Patient declined foley and wants to use urinal instead.

## 2022-12-21 NOTE — ED Notes (Signed)
Transport came to get patient for echo, I asked if they could do it bedside due to patient being ICU admit, and I'd have to go with patient. They advised that was fine. IR staff called and advised patient would be taken there after echo was done. Will call IR once echo has been completed.

## 2022-12-21 NOTE — ED Notes (Signed)
[facility] called to activate STEMI per Dr. Manus Gunning

## 2022-12-21 NOTE — ED Provider Notes (Signed)
7:04 AM Patient signed out to me by previous ED physician. Pt is a 57 yo male presenting for cp and sob. No prior PE. PE with right heart strain on CT. Heparin started. BP stable. Tachy at 127 currently. Full code.   ICU coming to bedside. TNK at bedside. Watch carefully for decompensation.    Physical Exam  BP 90/76   Pulse (!) 127   Temp (!) 97.4 F (36.3 C) (Oral)   Resp (!) 31   Ht 6\' 4"  (1.93 m)   Wt 106.7 kg   SpO2 94%   BMI 28.63 kg/m   Physical Exam  Procedures  Procedures  ED Course / MDM    Medical Decision Making Amount and/or Complexity of Data Reviewed Labs: ordered. Radiology: ordered.  Risk OTC drugs. Prescription drug management. Decision regarding hospitalization.   Patient stable. Going to bed upstairs. ICU on.        Franne Forts, DO 01/06/23 1610

## 2022-12-21 NOTE — ED Notes (Signed)
Patient transported to CT at this time with primary RN on cardiac monitor.

## 2022-12-21 NOTE — ED Provider Notes (Signed)
[place] EMERGENCY DEPARTMENT AT [facility] Provider Note   CSN: [id number] Arrival date & time: 12/21/22  9562     History  Chief Complaint  Patient presents with   Shortness of Breath    Pt woke up to use the bathroom, then went to the kitchen and felt like he couldn't breathe well, felt he was going to pass out. Pts wife brought the pt in POV, pt was hypoxic and tachycardic upon arrival.     Jeffrey [name] is a 57 y.o. male.  Level 5 caveat for acuity of condition.  Patient presents with chest pain, pressure, shortness of breath, nausea, diaphoresis.  Symptoms woke him at 1 AM.  States he tried to go to the bathroom and felt short of breath he was going to pass out.  On arrival patient is tachycardic, tachypneic, hypoxic.  He does have a history of pulmonary embolism but states he is on Eliquis and has not missed any doses.  He denies any cardiac history.  He felt well prior to going to bed yesterday.  Describes pain and pressure in the center of his chest without radiation.  Associate with shortness of breath and nausea and diaphoresis.  Worse with exertion.  His right leg is swollen but he states this is chronic.  Was first diagnosed with PEs in 2018. Denies any previous cardiac history.  Does have a history of high cholesterol and hypertension.  The history is provided by the patient. The history is limited by the condition of the patient.  Shortness of Breath      Home Medications Prior to Admission medications   Medication Sig Start Date End Date Taking? Authorizing Provider  amLODipine (NORVASC) 5 MG tablet Take 1 tablet (5 mg total) by mouth at bedtime. 02/23/22   Sabino Dick, DO  apixaban (ELIQUIS) 5 MG TABS tablet Take 1 tablet (5 mg total) by mouth 2 (two) times daily. Patient taking differently: Take 5 mg by mouth 2 (two) times daily. Starting 08/18/22, take 2 tablets (10 mg) twice daily for 7 days, then continue taking 1 tablet (5 mg) twice daily.  02/27/22   Bess Kinds, MD  atorvastatin (LIPITOR) 40 MG tablet TAKE 1 TABLET BY MOUTH ONCE DAILY 12/05/21 01/10/23  Bess Kinds, MD  DULoxetine (CYMBALTA) 30 MG capsule Take 1 capsule (30 mg total) by mouth daily. 09/25/22 12/24/22  Jerre Simon, MD  hydrochlorothiazide (HYDRODIURIL) 25 MG tablet Take 1 tablet (25 mg total) by mouth daily. 08/18/22   Sabino Dick, DO  NON FORMULARY Pt uses a cpap nighlty    [provider]  olmesartan (BENICAR) 40 MG tablet Take 1 tablet (40 mg total) by mouth at bedtime. 08/18/22   Sabino Dick, DO      Allergies    Patient has no known allergies.    Review of Systems   Review of Systems  Unable to perform ROS: Severe respiratory distress  Respiratory:  Positive for shortness of breath.     Physical Exam Updated Vital Signs BP 100/68   Pulse (!) 125   Temp (!) 97.4 F (36.3 C) (Oral)   Resp (!) 21   Ht 6\' 4"  (1.93 m)   Wt 106.7 kg   SpO2 93%   BMI 28.63 kg/m  Physical Exam Vitals and nursing note reviewed.  Constitutional:      General: He is not in acute distress.    Appearance: He is well-developed. He is ill-appearing and toxic-appearing.     Comments:  Ill-appearing, diaphoretic, tachypnea  HENT:     Head: Normocephalic and atraumatic.     Mouth/Throat:     Pharynx: No oropharyngeal exudate.  Eyes:     Conjunctiva/sclera: Conjunctivae normal.     Pupils: Pupils are equal, round, and reactive to light.  Neck:     Comments: No meningismus. Cardiovascular:     Rate and Rhythm: Regular rhythm. Tachycardia present.     Heart sounds: Normal heart sounds. No murmur heard.    Comments: Tachycardia 140s Pulmonary:     Effort: Pulmonary effort is normal. No respiratory distress.     Breath sounds: Normal breath sounds.  Abdominal:     Palpations: Abdomen is soft.     Tenderness: There is no abdominal tenderness. There is no guarding or rebound.  Musculoskeletal:        General: No tenderness. Normal range of  motion.     Cervical back: Normal range of motion and neck supple.     Right lower leg: Edema present.     Comments: Swelling of RLE  Skin:    General: Skin is warm.  Neurological:     Mental Status: He is alert and oriented to person, place, and time.     Cranial Nerves: No cranial nerve deficit.     Motor: No abnormal muscle tone.     Coordination: Coordination normal.     Comments:  5/5 strength throughout. CN 2-12 intact.Equal grip strength.   Psychiatric:        Behavior: Behavior normal.     ED Results / Procedures / Treatments   Labs (all labs ordered are listed, but only abnormal results are displayed) Labs Reviewed  CBC WITH DIFFERENTIAL/PLATELET - Abnormal; Notable for the following components:      Result Value   WBC 11.4 (*)    Platelets 120 (*)    Neutro Abs 8.5 (*)    Abs Immature Granulocytes 0.10 (*)    All other components within normal limits  COMPREHENSIVE METABOLIC PANEL - Abnormal; Notable for the following components:   Sodium 133 (*)    Potassium 2.6 (*)    Chloride 95 (*)    CO2 21 (*)    Glucose, Bld 316 (*)    Creatinine, Ser 1.59 (*)    Calcium 8.7 (*)    AST 205 (*)    ALT 105 (*)    GFR, Estimated 50 (*)    Anion gap 17 (*)    All other components within normal limits  BRAIN NATRIURETIC PEPTIDE - Abnormal; Notable for the following components:   B Natriuretic Peptide 110.3 (*)    All other components within normal limits  I-STAT CHEM 8, ED - Abnormal; Notable for the following components:   Potassium 2.6 (*)    Chloride 97 (*)    Creatinine, Ser 1.50 (*)    Glucose, Bld 306 (*)    Calcium, Ion 0.98 (*)    TCO2 18 (*)    All other components within normal limits  TROPONIN I (HIGH SENSITIVITY) - Abnormal; Notable for the following components:   Troponin I (High Sensitivity) 1,011 (*)    All other components within normal limits  LIPASE, BLOOD  HEPARIN LEVEL (UNFRACTIONATED)  APTT  APTT    EKG EKG  Interpretation Date/Time:  Monday December 21 2022 06:16:46 EDT Ventricular Rate:  126 PR Interval:  105 QRS Duration:  110 QT Interval:  321 QTC Calculation: 465 R Axis:   99  Text Interpretation: Sinus tachycardia  Borderline right axis deviation Borderline ST depression, diffuse leads Abnormal T, consider ischemia, diffuse leads ST elevation in aVR, ST depression inferiorly and laterally Confirmed by Glynn Octave [id number]) on 12/21/2022 6:52:35 AM  Radiology DG Chest Portable 1 View  Result Date: 12/21/2022 CLINICAL DATA:  Chest pain and shortness of breath. EXAM: PORTABLE CHEST 1 VIEW COMPARISON:  08/02/2022 FINDINGS: Heart size and mediastinal contours are unremarkable. No pleural fluid, interstitial edema or airspace disease. Visualized osseous structures are unremarkable. IMPRESSION: Negative portable chest. Electronically Signed   By: Signa Kell M.D.   On: 12/21/2022 06:33    Procedures .Critical Care  Performed by: Glynn Octave, MD Authorized by: Glynn Octave, MD   Critical care provider statement:    Critical care time (minutes):  60   Critical care time was exclusive of:  Separately billable procedures and treating other patients   Critical care was necessary to treat or prevent imminent or life-threatening deterioration of the following conditions:  Respiratory failure and circulatory failure   Critical care was time spent personally by me on the following activities:  Development of treatment plan with patient or surrogate, discussions with consultants, evaluation of patient's response to treatment, examination of patient, ordering and review of laboratory studies, ordering and review of radiographic studies, ordering and performing treatments and interventions, pulse oximetry, re-evaluation of patient's condition, review of old charts, blood draw for specimens and obtaining history from patient or surrogate   I assumed direction of critical care for this patient  from another provider in my specialty: no     Care discussed with: admitting provider       Medications Ordered in ED Medications  morphine (PF) 4 MG/ML injection 4 mg (has no administration in time range)  nitroGLYCERIN (NITROSTAT) SL tablet 0.4 mg (0.4 mg Sublingual Not Given 12/21/22 0640)  potassium chloride 10 mEq in 100 mL IVPB (10 mEq Intravenous New Bag/Given 12/21/22 9147)  heparin ADULT infusion 100 units/mL (25000 units/246mL) (1,600 Units/hr Intravenous New Bag/Given 12/21/22 0640)  aspirin chewable tablet 324 mg (324 mg Oral Given 12/21/22 0613)  diltiazem (CARDIZEM) injection 10 mg (10 mg Intravenous Given 12/21/22 0610)  heparin bolus via infusion 4,000 Units (4,000 Units Intravenous Bolus from Bag 12/21/22 0640)  iohexol (OMNIPAQUE) 350 MG/ML injection 75 mL (75 mLs Intravenous Contrast Given 12/21/22 8295)    ED Course/ Medical Decision Making/ A&P                                 Medical Decision Making Amount and/or Complexity of Data Reviewed Independent Historian: spouse Labs: ordered. Decision-making details documented in ED Course. Radiology: ordered and independent interpretation performed. Decision-making details documented in ED Course. ECG/medicine tests: ordered and independent interpretation performed. Decision-making details documented in ED Course.  Risk OTC drugs. Prescription drug management. Decision regarding hospitalization.   Patient arrives in extremis with tachypnea, tachycardia, hypoxia, chest pain and pressure.  He is tachycardic and hypoxic.  EKG shows ST elevation in aVR as well as ST depressions inferiorly and laterally.  Discussed with Dr. Jacinto Halim of interventional cardiology.  Would not activate code STEMI at this time but he will evaluate patient.  Does not meet STEMI criteria but there is concern for global ischemia.  Also concerning for right heart strain given his known history of PE but states no missed doses of Eliquis.  However  pharmacist has learned that patient is long taking Eliquis once a day.  CT confirms large clot burden with pulmonary emboli bilaterally.  Patient initiated on IV heparin.  Discussed with pharmacy [place].  She agrees can give bolus as patient has not had his Eliquis dose yesterday and has only been taking it once a day.  Initiate heparin infusion.  TNK at bedside.  Discussed with Dr. Karin Golden of radiology.  He does confirm large pulmonary emboli bilaterally with right heart strain.  Discussed with Dr. Gaynell Face and critical care team.  They will evaluate for likely systemic thrombolytics given patient's tachycardia and tachypnea.  Blood pressure soft 107/80.  Discussed with Dr. Jacinto Halim again.  He will cancel code STEMI at this time given pulmonary emboli likely contributing to EKG changes also potassium was low at 2.6 which is aggressively repleted. Feels improved. Pending ICU admission at shift change.  Not hypoxic on 2 L nasal cannula.  Tachycardic in the 120s, tachypneic at 30.  Dr. Wallace Cullens aware of patient at shift change.        Final Clinical Impression(s) / ED Diagnoses Final diagnoses:  Subacute massive pulmonary embolism [facility])    Rx / DC Orders ED Discharge Orders     None         Seeley Hissong, Jeannett Senior, MD 12/21/22 [id number]

## 2022-12-21 NOTE — Procedures (Signed)
Vascular and Interventional Radiology Procedure Note  Patient: Jeffrey [name] DOB: [birth year] Medical Record Number: [id number] Note Date/Time: 12/21/22 10:36 AM   Performing Physician: Roanna Banning, MD Assistant(s): None  Diagnosis: Submassive PE. Pulmonary HTN w R heart dysfunction on Echo.  Procedure:   PULMONARY ARTERIOGRAPHY THROMBOLYSIS, Catheter-directed   Anesthesia: Conscious Sedation Complications: None Estimated Blood Loss: Minimal Specimens: None  Findings:  - access via the RIGHT femoral vein. - Pulmonary arterial pressures, 32/17 [23 mean PAP] - PE burden at pulmonary arterial bifurcations, with successful lytic catheter placement(s).  - LEFT Uni-Fuse ; 90 cm infusion and 10 cm total length catheter - LEFT Uni-Fuse ; 90 cm infusion and 10 cm total length catheter  Plan: - each infusion catheter will receive 1 mg/hr tPA, x 12 hours, for a total of 24 mg tPA over 12 hours - q6h H/h, fibrinogen, and heparin levels - the sheaths will receive each 20 mL/hr of NS gtt for KVO - NPO vs Clear liquid diet recommended - Pharmacy [facility] team) consult for heparin level management.  - Please page PCCM and VIR MD on call if fibrinogen <150, severe HA, AMS, or new / acute GIB - Bedrest with RLE straight x12hrs, while the catheters are in place.  - VIR service to follow and will assess catheter function for removal on rounds in AM.  Final report to follow once all images are reviewed and compared with previous studies.  See detailed dictation with images in PACS. The patient tolerated the procedure well without incident or complication and was transported to ICU in stable condition.    Roanna Banning, MD [facility] Radiology   Pager. [phone] Clinic. [phone]

## 2022-12-21 NOTE — Progress Notes (Signed)
Pharmacy note: electrolyte replacement  -K= 3.4 -SCr 1.35. CrCl ~ 80  Plan -Kdue x1 -BMET in am  Harland German, PharmD Clinical Pharmacist **Pharmacist phone directory can now be found on [email or web address] ([email or web address]).  Listed under [facility].

## 2022-12-21 NOTE — Progress Notes (Signed)
ANTICOAGULATION CONSULT NOTE `  Pharmacy Consult for IV heparin  Indication: pulmonary embolus  No Known Allergies  Patient Measurements: Height: 6\' 4"  (193 cm) Weight: 106.7 kg (235 lb 3.7 oz) IBW/kg (Calculated) : 86.8 Heparin Dosing Weight: 106.7 kg  Vital Signs: Temp: 98 F (36.7 C) (09/23 1508) Temp Source: Oral (09/23 1508) BP: 114/89 (09/23 1400) Pulse Rate: 95 (09/23 1400)  Labs: Recent Labs    12/21/22 0557 12/21/22 0603 12/21/22 0653 12/21/22 0813 12/21/22 1351  HGB 15.3 16.7  --   --  14.0  HCT 45.8 49.0  --   --  40.4  PLT 120*  --   --   --  110*  APTT  --   --  93*  --  59*  HEPARINUNFRC  --   --  0.46  --  0.23*  CREATININE 1.59* 1.50*  --   --   --   TROPONINIHS 1,011*  --   --  1,057*  --     Estimated Creatinine Clearance: 71.1 mL/min (A) (by C-G formula based on SCr of 1.5 mg/dL (H)).   Medical History: Past Medical History:  Diagnosis Date   Acute pulmonary embolus (HCC)    bilateral submassive PE in setting of missing several doses of Xarelto for his DVT   Arthritis    DVT (deep venous thrombosis) (HCC) 11/28/2013   RT LEG   High cholesterol    Hypertension    Lumbar spine scoliosis    Marijuana use    Sleep apnea    Assessment: Jeffrey [name] is a 57 y.o. year old male admitted on 12/21/2022 with concern for PE. Eliquis prior to admission (LD 8/22 AM). Of note patient reports taking eliquis once daily instead of twice daily. Pharmacy consulted to dose heparin.  He is now undergoing catheter directed lysis -heparin level = 0.23, aPTT= 59, hg= 14, plt=110   Goal of Therapy:  Heparin level 0.3-0.7 units/ml Monitor platelets by anticoagulation protocol: Yes   Plan:  -Increase heparin to 1600 units/hr -Heparin level and aPTT in 6 hours  Harland German, PharmD Clinical Pharmacist **Pharmacist phone directory can now be found on [email or web address] ([email or web address]).  Listed under [facility].

## 2022-12-21 NOTE — Sedation Documentation (Signed)
Pressure 32/17 (23)

## 2022-12-21 NOTE — H&P (Signed)
NAME:  Jeffrey [name], MRN:  [id number], DOB:  1965-05-18, LOS: 0 ADMISSION DATE:  12/21/2022, CONSULTATION DATE:  12/21/2022 REFERRING MD:  Dr. Wallace Cullens - EDP, CHIEF COMPLAINT:  Submassive PE   History of Present Illness:  Jeffrey [name] is a 56 year old male with a past medical history significant for prior PE anticoagulated with Eliquis, DVT, HTN, HLD, sleep apnea, arthritis, and lumbar stenosis who presented to the ED 9/23 with complaints of nausea, vomiting, and pressure-like chest pain.  Patient reports symptoms began 2 days prior to admission and worsened day of arrival.  Patient reported that shortness of breath worsening with early a.m. while ambulating to restroom with associated symptoms of presyncope.  Reports pressure-like chest pain but feeling slightly improved since admission.  Patient also reports significant coughing with productive phlegm resulting in nausea and vomiting.  On ED arrival patient was seen tachypneic, tachycardic and hypotensive.  Lab work on admission significant for NA 133, K2.6, chloride 95, glucose 316, creatinine 159, anion gap 17, AST 205, ALT 105, high-sensitivity troponin 1011, BNP 110, WBC 11.4.  Given acute concern for PE CTA chest obtained and revealed acute PE with CT evidence of right heart strain, RV to LV ratio 2.05.  PCCM consulted for further management and admission as patient will likely need catheter directed lytics.  Pertinent  Medical History  PE anticoagulated with Eliquis, DVT, HTN, HLD, sleep apnea, arthritis, and lumbar stenosis   Significant Hospital Events: Including procedures, antibiotic start and stop dates in addition to other pertinent events   12/21/22 admitted with complaints of shortness of breath, chest pain, nausea and vomiting.  Found to have acute submassive PE  Interim History / Subjective:  As above  Objective   Blood pressure 107/80, pulse (!) 121, temperature (!) 97.4 F (36.3 C), temperature source Oral, resp. rate (!)  31, height 6\' 4"  (1.93 m), weight 106.7 kg, SpO2 96%.       No intake or output data in the 24 hours ending 12/21/22 0753 Filed Weights   12/21/22 0615  Weight: 106.7 kg    Examination: General: Acute ill-appearing middle-age male lying in bed in moderate discomfort but no acute distress HEENT: [place]/AT, MM pink/moist, PERRL,  Neuro: Alert and oriented, nonfocal CV: Tachycardia s1s2 regular rate and rhythm, no murmur, rubs, or gallops,  PULM: Clear to auscultation bilaterally, no increased work of breathing, no added breath sounds GI: soft, bowel sounds active in all 4 quadrants, non-tender, non-distended Extremities: warm/dry, no edema  Skin: no rashes or lesions   Resolved Hospital Problem list     Assessment & Plan:  Submassive PE with CT evidence of right heart strain -CTA chest obtained and revealed acute PE with CT evidence of right heart strain, RV to LV ratio 2.05.   -BNP and high-sensitivity troponin positive on admission.  Lactic pending -Patient reports missing "a few doses" of Eliquis a few weeks prior to admission due to "absentmindedness" -PESI score 139 meeting criteria for high risk P: Consult to interventional radiology to assess likely need of catheter directed lytics Continue systemic heparin Goal INR greater than 2 Obtain echocardiogram and lower extremity Dopplers  Elevated troponin concerning for acute ACS -Patient denies any known prior cardiac history -EKG with changes including ST depression in inferior and lateral leads compared to prior EKG May 2024 -Troponin on admission 1011 P: Consult to cardiology to determine if there is a need for intervention prior to catheter directed lytics Echo as above Continuous telemetry Trend troponin Close  monitoring in the ICU setting Optimize electrolytes  Hyperglycemia with no known history of diabetes Open anion gap -Glucose 316 on admission compared to 86 in June or 2024 P: Check hemoglobin  A1c SSI  Acute Kidney Injury  -Creatinine on admission 1.59 with GFR 50 compared to creatinine 0.83 GFR greater than 60 in June 2024 P: Follow renal function Monitor urine output Trend Bmet Avoid nephrotoxins Ensure adequate renal perfusion  IV hydration Consider renal ultrasound   Elevated AST and ALT -Likely stress reaction as LFTs normal June 2024 P: Trend LFTs Avoid hepatotoxins  Mild leukocytosis -Likely reactive P: Trend CBC and fever curve No acute indications for antibiotics at this time  Hypokalemia Hyponatremia P: IV Hydration Supplement Trend BMP  Best Practice (right click and "Reselect all SmartList Selections" daily)   Diet/type: NPO DVT prophylaxis: systemic heparin GI prophylaxis: PPI Lines: N/A Foley:  N/A Code Status:  full code Last date of multidisciplinary goals of care discussion: Full code, continue to update patient and family daily  Labs   CBC: Recent Labs  Lab 12/21/22 0557 12/21/22 0603  WBC 11.4*  --   NEUTROABS 8.5*  --   HGB 15.3 16.7  HCT 45.8 49.0  MCV 98.5  --   PLT 120*  --     Basic Metabolic Panel: Recent Labs  Lab 12/21/22 0557 12/21/22 0603  NA 133* 136  K 2.6* 2.6*  CL 95* 97*  CO2 21*  --   GLUCOSE 316* 306*  BUN 16 17  CREATININE 1.59* 1.50*  CALCIUM 8.7*  --    GFR: Estimated Creatinine Clearance: 71.1 mL/min (A) (by C-G formula based on SCr of 1.5 mg/dL (H)). Recent Labs  Lab 12/21/22 0557  WBC 11.4*    Liver Function Tests: Recent Labs  Lab 12/21/22 0557  AST 205*  ALT 105*  ALKPHOS 101  BILITOT 0.7  PROT 6.9  ALBUMIN 3.5   Recent Labs  Lab 12/21/22 0557  LIPASE 50   No results for input(s): "AMMONIA" in the last 168 hours.  ABG    Component Value Date/Time   PHART 7.326 (L) 01/14/2022 1434   PCO2ART 45.0 01/14/2022 1434   PO2ART 267 (H) 01/14/2022 1434   HCO3 23.7 01/14/2022 1434   TCO2 18 (L) 12/21/2022 0603   ACIDBASEDEF 3.0 (H) 01/14/2022 1434   O2SAT 100  01/14/2022 1434     Coagulation Profile: No results for input(s): "INR", "PROTIME" in the last 168 hours.  Cardiac Enzymes: No results for input(s): "CKTOTAL", "CKMB", "CKMBINDEX", "TROPONINI" in the last 168 hours.  HbA1C: Hemoglobin A1C  Date/Time Value Ref Range Status  01/02/2021 09:27 AM 5.6 4.0 - 5.6 % Final  10/26/2019 09:50 AM 5.5 4.0 - 5.6 % Final    CBG: No results for input(s): "GLUCAP" in the last 168 hours.  Review of Systems:   Please see the history of present illness. All other systems reviewed and are negative   Past Medical History:  He,  has a past medical history of Acute pulmonary embolus (HCC), Arthritis, DVT (deep venous thrombosis) (HCC) (11/28/2013), High cholesterol, Hypertension, Lumbar spine scoliosis, Marijuana use, and Sleep apnea.   Surgical History:   Past Surgical History:  Procedure Laterality Date   ABDOMINAL EXPOSURE N/A 01/14/2022   Procedure: ABDOMINAL EXPOSURE;  Surgeon: Cephus Shelling, MD;  Location: [facility] OR;  Service: Vascular;  Laterality: N/A;   ANTERIOR LUMBAR FUSION Left 01/14/2022   Procedure: Lumbar three-four Lumbar four-five Oblique Lumbar Interbody Fusion;  Surgeon: Maurice Small,  Clovis Pu, MD;  Location: [facility] OR;  Service: Neurosurgery;  Laterality: Left;   HERNIA REPAIR     2005 and 2011; umbilical hernia repair   KNEE SURGERY Bilateral    Left knee 1993, right knee 2004   TONSILLECTOMY     TOTAL KNEE ARTHROPLASTY Left 02/10/2017   Procedure: LEFT TOTAL KNEE ARTHROPLASTY;  Surgeon: Tarry Kos, MD;  Location: [facility] OR;  Service: Orthopedics;  Laterality: Left;   TOTAL KNEE ARTHROPLASTY Right 07/28/2017   Procedure: RIGHT TOTAL KNEE ARTHROPLASTY;  Surgeon: Tarry Kos, MD;  Location: [facility] OR;  Service: Orthopedics;  Laterality: Right;   TRANSFORAMINAL LUMBAR INTERBODY FUSION W/ MIS 1 LEVEL N/A 01/14/2022   Procedure: OPEN TRANSFORAMINAL LUMBAR INTERBODY FUSION  LUMBAR FIVE-SACRAL ONE ,LAMINECTOMIES AND POSTERIOR LATERAL  INSTRUMENTATION FUSION  LUMBAR THREE TO FIVE SACRAL ONE;  Surgeon: Jadene Pierini, MD;  Location: [facility] OR;  Service: Neurosurgery;  Laterality: N/A;     Social History:   reports that he has been smoking cigars. He has never used smokeless tobacco. He reports current alcohol use of about 12.0 standard drinks of alcohol per week. He reports current drug use. Drug: Marijuana.   Family History:  His family history includes Alcohol abuse in his brother and mother; Arthritis in his father, maternal grandfather, maternal grandmother, mother, and another family member; Asthma in his cousin; Clotting disorder in his mother; Depression in his mother; Drug abuse in his brother; Heart disease in his maternal grandfather, maternal grandmother, mother, and another family member; Hypertension in his mother; Kidney disease in his mother; Sickle cell anemia in his brother.   Allergies No Known Allergies   Home Medications  Prior to Admission medications   Medication Sig Start Date End Date Taking? Authorizing Provider  amLODipine (NORVASC) 5 MG tablet Take 1 tablet (5 mg total) by mouth at bedtime. 02/23/22  Yes Sabino Dick, DO  apixaban (ELIQUIS) 5 MG TABS tablet Take 1 tablet (5 mg total) by mouth 2 (two) times daily. 02/27/22  Yes Sowell, Apolinar Junes, MD  atorvastatin (LIPITOR) 40 MG tablet TAKE 1 TABLET BY MOUTH ONCE DAILY 12/05/21 01/10/23 Yes Sowell, Apolinar Junes, MD  DULoxetine (CYMBALTA) 30 MG capsule Take 1 capsule (30 mg total) by mouth daily. 09/25/22 12/24/22 Yes Jerre Simon, MD  hydrochlorothiazide (HYDRODIURIL) 25 MG tablet Take 1 tablet (25 mg total) by mouth daily. 08/18/22  Yes Espinoza, Myrlene Broker, DO  olmesartan (BENICAR) 40 MG tablet Take 1 tablet (40 mg total) by mouth at bedtime. 08/18/22  Yes Sabino Dick, DO  NON FORMULARY Pt uses a cpap nighlty Patient not taking: Reported on 12/21/2022    [provider]     Critical care time:   CRITICAL CARE Performed by: Arin Peral D.  Harris  Total critical care time: 42 minutes  Critical care time was exclusive of separately billable procedures and treating other patients.  Critical care was necessary to treat or prevent imminent or life-threatening deterioration.  Critical care was time spent personally by me on the following activities: development of treatment plan with patient and/or surrogate as well as nursing, discussions with consultants, evaluation of patient's response to treatment, examination of patient, obtaining history from patient or surrogate, ordering and performing treatments and interventions, ordering and review of laboratory studies, ordering and review of radiographic studies, pulse oximetry and re-evaluation of patient's condition.  Quinterious Walraven D. Tiburcio Pea, NP-C [facility] Personal contact information can be found on Amion  If no contact or response made please call [phone]  12/21/2022, 8:20 AM

## 2022-12-22 ENCOUNTER — Telehealth: Payer: Self-pay | Admitting: Internal Medicine

## 2022-12-22 ENCOUNTER — Inpatient Hospital Stay (HOSPITAL_COMMUNITY): Payer: Commercial Managed Care - PPO

## 2022-12-22 DIAGNOSIS — J9601 Acute respiratory failure with hypoxia: Secondary | ICD-10-CM | POA: Diagnosis not present

## 2022-12-22 DIAGNOSIS — I2699 Other pulmonary embolism without acute cor pulmonale: Secondary | ICD-10-CM | POA: Diagnosis not present

## 2022-12-22 HISTORY — PX: IR THROMB F/U EVAL ART/VEN FINAL DAY (MS): IMG5379

## 2022-12-22 LAB — CBC
HCT: 34.7 % — ABNORMAL LOW (ref 39.0–52.0)
HCT: 33.5 % — ABNORMAL LOW (ref 39.0–52.0)
HCT: 32.2 % — ABNORMAL LOW (ref 39.0–52.0)
Hemoglobin: 11.7 g/dL — ABNORMAL LOW (ref 13.0–17.0)
Hemoglobin: 11.5 g/dL — ABNORMAL LOW (ref 13.0–17.0)
Hemoglobin: 11.1 g/dL — ABNORMAL LOW (ref 13.0–17.0)
MCH: 33.3 pg (ref 26.0–34.0)
MCH: 33 pg (ref 26.0–34.0)
MCH: 33.7 pg (ref 26.0–34.0)
MCHC: 33.7 g/dL (ref 30.0–36.0)
MCHC: 34.3 g/dL (ref 30.0–36.0)
MCHC: 34.5 g/dL (ref 30.0–36.0)
MCV: 98.9 fL (ref 80.0–100.0)
MCV: 96 fL (ref 80.0–100.0)
MCV: 97.9 fL (ref 80.0–100.0)
Platelets: 108 10*3/uL — ABNORMAL LOW (ref 150–400)
Platelets: 114 10*3/uL — ABNORMAL LOW (ref 150–400)
Platelets: 112 10*3/uL — ABNORMAL LOW (ref 150–400)
RBC: 3.51 MIL/uL — ABNORMAL LOW (ref 4.22–5.81)
RBC: 3.49 MIL/uL — ABNORMAL LOW (ref 4.22–5.81)
RBC: 3.29 MIL/uL — ABNORMAL LOW (ref 4.22–5.81)
RDW: 15.2 % (ref 11.5–15.5)
RDW: 15.1 % (ref 11.5–15.5)
RDW: 15 % (ref 11.5–15.5)
WBC: 8.7 10*3/uL (ref 4.0–10.5)
WBC: 9.2 10*3/uL (ref 4.0–10.5)
WBC: 8.6 10*3/uL (ref 4.0–10.5)
nRBC: 0 % (ref 0.0–0.2)
nRBC: 0 % (ref 0.0–0.2)
nRBC: 0 % (ref 0.0–0.2)

## 2022-12-22 LAB — BASIC METABOLIC PANEL
Anion gap: 10 (ref 5–15)
BUN: 13 mg/dL (ref 6–20)
CO2: 23 mmol/L (ref 22–32)
Calcium: 8.1 mg/dL — ABNORMAL LOW (ref 8.9–10.3)
Chloride: 99 mmol/L (ref 98–111)
Creatinine, Ser: 0.9 mg/dL (ref 0.61–1.24)
GFR, Estimated: >60 mL/min (ref >60)
Glucose, Bld: 161 mg/dL — ABNORMAL HIGH (ref 70–99)
Potassium: 3.4 mmol/L — ABNORMAL LOW (ref 3.5–5.1)
Sodium: 132 mmol/L — ABNORMAL LOW (ref 135–145)

## 2022-12-22 LAB — PHOSPHORUS: Phosphorus: 2.2 mg/dL — ABNORMAL LOW (ref 2.5–4.6)

## 2022-12-22 LAB — HEPARIN LEVEL (UNFRACTIONATED)
Heparin Unfractionated: 0.68 IU/mL (ref 0.30–0.70)
Heparin Unfractionated: 0.73 IU/mL — ABNORMAL HIGH (ref 0.30–0.70)

## 2022-12-22 LAB — APTT
aPTT: 103 seconds — ABNORMAL HIGH (ref 24–36)
aPTT: 149 seconds — ABNORMAL HIGH (ref 24–36)

## 2022-12-22 LAB — FIBRINOGEN
Fibrinogen: 341 mg/dL (ref 210–475)
Fibrinogen: 370 mg/dL (ref 210–475)
Fibrinogen: 381 mg/dL (ref 210–475)
Fibrinogen: 388 mg/dL (ref 210–475)

## 2022-12-22 LAB — GLUCOSE, CAPILLARY
Glucose-Capillary: 99 mg/dL (ref 70–99)
Glucose-Capillary: 141 mg/dL — ABNORMAL HIGH (ref 70–99)
Glucose-Capillary: 119 mg/dL — ABNORMAL HIGH (ref 70–99)

## 2022-12-22 LAB — MAGNESIUM: Magnesium: 2.1 mg/dL (ref 1.7–2.4)

## 2022-12-22 MED ORDER — POTASSIUM CHLORIDE CRYS ER 20 MEQ PO TBCR
40.0000 meq | EXTENDED_RELEASE_TABLET | Freq: Once | ORAL | Status: AC
  Administered 2022-12-22: 40 meq via ORAL
  Filled 2022-12-22: qty 2

## 2022-12-22 MED ORDER — TIOTROPIUM BROMIDE MONOHYDRATE 18 MCG IN CAPS: 18.0000 ug | ORAL_CAPSULE | Freq: Every day | RESPIRATORY_TRACT | Status: DC

## 2022-12-22 MED ORDER — POTASSIUM PHOSPHATES 15 MMOLE/5ML IV SOLN
30.0000 mmol | Freq: Once | INTRAVENOUS | Status: AC
  Administered 2022-12-22: 30 mmol via INTRAVENOUS
  Filled 2022-12-22: qty 10

## 2022-12-22 MED ORDER — UMECLIDINIUM BROMIDE 62.5 MCG/ACT IN AEPB
1.0000 | INHALATION_SPRAY | Freq: Every day | RESPIRATORY_TRACT | Status: DC
  Administered 2022-12-24 – 2022-12-25 (×2): 1 via RESPIRATORY_TRACT
  Filled 2022-12-22 (×2): qty 7

## 2022-12-22 NOTE — Progress Notes (Signed)
ANTICOAGULATION CONSULT NOTE ` Pharmacy Consult for IV heparin  Indication: pulmonary embolus Brief A/P: Heparin level supratherapeutic  Decrease Heparin rate  No Known Allergies  Patient Measurements: Height: 6\' 4"  (193 cm) Weight: 105.8 kg (233 lb 4 oz) IBW/kg (Calculated) : 86.8 Heparin Dosing Weight: 106.7 kg  Vital Signs: Temp: 99.1 F (37.3 C) (09/23 2341) Temp Source: Oral (09/23 2341) BP: 129/95 (09/24 0000) Pulse Rate: 85 (09/24 0000)  Labs: Recent Labs    12/21/22 0557 12/21/22 0603 12/21/22 0653 12/21/22 0813 12/21/22 1351 12/21/22 2207 12/22/22 0024  HGB 15.3 16.7  --   --  14.0 12.2*  --   HCT 45.8 49.0  --   --  40.4 35.9*  --   PLT 120*  --   --   --  110* 116*  --   APTT  --   --  93*  --  59*  --  149*  HEPARINUNFRC  --   --  0.46  --  0.23*  --  0.73*  CREATININE 1.59* 1.50*  --   --  1.35*  --   --   TROPONINIHS 1,011*  --   --  1,057*  --   --   --     Estimated Creatinine Clearance: 78.7 mL/min (A) (by C-G formula based on SCr of 1.35 mg/dL (H)).  Assessment: 57 y.o. male admitted with new PE undergoing catheter directed lysis for heparin   Goal of Therapy:  Heparin level 0.3-0.7 units/ml Monitor platelets by anticoagulation protocol: Yes   Plan:  Decrease Heparin 1700 units/hr Follow-up am labs.  Geannie Risen, PharmD, BCPS

## 2022-12-22 NOTE — Progress Notes (Signed)
Referring Physician(s): Dr Merrily Pew  Supervising Physician: Richarda Overlie  Patient Status:  [facility] [facility] - In-pt  Chief Complaint:  Pulmonary embolus  Subjective:  IR procedure 9/23:    PULMONARY ARTERIOGRAPHY THROMBOLYSIS, Catheter-directed  Feeling better today On RA: 96% 02 sat Sleeping well +hematoma at Rt groin site is still sl tender No larger that this am (10 am) Borders are noted  Allergies: Patient has no known allergies.  Medications: Prior to Admission medications   Medication Sig Start Date End Date Taking? Authorizing Provider  amLODipine (NORVASC) 5 MG tablet Take 1 tablet (5 mg total) by mouth at bedtime. 02/23/22  Yes Sabino Dick, DO  apixaban (ELIQUIS) 5 MG TABS tablet Take 1 tablet (5 mg total) by mouth 2 (two) times daily. 02/27/22  Yes Sowell, Apolinar Junes, MD  atorvastatin (LIPITOR) 40 MG tablet TAKE 1 TABLET BY MOUTH ONCE DAILY 12/05/21 01/10/23 Yes Sowell, Apolinar Junes, MD  DULoxetine (CYMBALTA) 30 MG capsule Take 1 capsule (30 mg total) by mouth daily. 09/25/22 12/24/22 Yes Jerre Simon, MD  hydrochlorothiazide (HYDRODIURIL) 25 MG tablet Take 1 tablet (25 mg total) by mouth daily. 08/18/22  Yes Espinoza, Myrlene Broker, DO  olmesartan (BENICAR) 40 MG tablet Take 1 tablet (40 mg total) by mouth at bedtime. 08/18/22  Yes Sabino Dick, DO  NON FORMULARY Pt uses a cpap nighlty Patient not taking: Reported on 12/21/2022    [provider]     Vital Signs: BP (!) 133/92   Pulse 82   Temp 99 F (37.2 C) (Oral)   Resp (!) 21   Ht 6\' 4"  (1.93 m)   Wt 233 lb 4 oz (105.8 kg)   SpO2 96%   BMI 28.39 kg/m   Physical Exam Vitals reviewed.  Skin:    Comments: Rt groin with +hematoma ~4-5 cm in size Borders are outlined-- unchanged since 10 am today  Rt groin Hematoma is palpable; firm Borders are soft Tender Rt foot good pulses  Neurological:     Mental Status: He is alert.     Imaging: IR THROMB F/U EVAL ART/VEN FINAL DAY (MS)  Result Date:  12/22/2022 INDICATION: History of sub massive pulmonary embolism, post initiation of bilateral pulmonary arterial thrombolysis on 12/21/2022. Patient has completed prescribed course of bilateral pulmonary arterial lytic infusion and presents today for repeat pulmonary arterial pressure measurements prior to infusion catheter removal. EXAM: PULMONARY ARTERIAL PRESSURE MEASUREMENT AND INFUSION CATHETER(S) REMOVAL COMPARISON:  Chest XR, earlier same day. IR fluoroscopy, 12/21/2022. MEDICATIONS: None CONTRAST:  None FLUOROSCOPY TIME:  None COMPLICATIONS: None immediate. TECHNIQUE: The patient was positioned supine on her hospital bed. Review of this morning's chest radiograph demonstrates unchanged positioning of bilateral pulmonary arterial infusion catheters with tip of the right-sided pulmonary arterial infusion catheter approximately 5-10 cm peripheral to the expected outflow of the main pulmonary artery. As such, the right pulmonary arterial catheter's infusion wire was removed and the multi side-hole infusion catheter was retracted to the estimated location of the main pulmonary artery. Pressure measurements were acquired from this location. At this point, the procedure was terminated. All wires, catheters and sheaths were removed from the patient. Hemostasis was achieved at the right groin access site with manual compression. A dressing was placed. The patient tolerated the procedure well without immediate postprocedural complication. FINDINGS: Acquired pressure measurements as follows: Preprocedural main pulmonary artery-32/17; mean-23 (normal: < 25/10) Postprocedural main pulmonary artery-31/12; mean-22 IMPRESSION: Successful bilateral catheter directed pulmonary arterial thrombolysis with reduction in pressure within the main pulmonary artery. Roanna Banning, MD  [facility] Radiology Electronically Signed   By: Roanna Banning M.D.   On: 12/22/2022 07:24   DG Chest  Port 1 View  Result Date: 12/22/2022 CLINICAL DATA:  [id number] Post-operative state [id number] Pulmonary emboli s/p lytic catheter placement EXAM: PORTABLE CHEST 1 VIEW COMPARISON:  Chest XR and CTA chest, 12/01/2022. IR fluoroscopy, 12/21/2022. FINDINGS: Support lines: BILATERAL pulmonary arterial catheters well positioned and unchanged from IR procedure. Cardiomediastinal silhouette is within normal limits. Lungs are well inflated. No focal consolidation or mass. No pleural effusion or pneumothorax. No interval osseous abnormality. IMPRESSION: 1. Stable positioning of BILATERAL pulmonary arterial catheters. 2. No acute cardiopulmonary process. Electronically Signed   By: Roanna Banning M.D.   On: 12/22/2022 06:13   IR INFUSION THROMBOL ARTERIAL INITIAL (MS)  Result Date: 12/21/2022 INDICATION: Saddle embolus.  Submassive PE burden. EXAM: Procedures: 1. PULMONARY ARTERIOGRAPHY 2. PULMONARY ARTERIAL LYTIC INFUSION CATHETER PLACEMENT, BILATERAL COMPARISON:  CTA PE, 12/21/2022 and 06/03/2016. MEDICATIONS: Zofran 4 mg IV ANESTHESIA/SEDATION: Moderate (conscious) sedation was employed during this procedure. A total of Versed 1.5 mg and Fentanyl 75 mcg was administered intravenously. Moderate Sedation Time: 34 minutes. The patient's level of consciousness and vital signs were monitored continuously by radiology nursing throughout the procedure under my direct supervision. CONTRAST:  40mL OMNIPAQUE IOHEXOL 300 MG/ML  SOLN FLUOROSCOPY TIME:  Fluoroscopic dose; 38 mGy COMPLICATIONS: None immediate. TECHNIQUE: Informed written consent was obtained from the patient and/or patient's representative after a discussion of the risks, benefits and alternatives to treatment. Questions regarding the procedure were encouraged and answered. A timeout was performed prior to the initiation of the procedure. Ultrasound scanning was performed of the RIGHT groin and demonstrated patency of the RIGHT common femoral vein. As such, the right  common femoral vein was selected venous access. The RIGHT groin was prepped and draped in the usual sterile fashion, and a sterile drape was applied covering the operative field. Maximum barrier sterile technique with sterile gowns and gloves were used for the procedure. A timeout was performed prior to the initiation of the procedure. Local anesthesia was provided with 1% lidocaine. Under direct ultrasound guidance, the right common femoral vein was accessed with a micro puncture kit ultimately allowing placement of a 6 Fr 10 cm vascular sheath. Slightly cranial to this initial access, the right common femoral was again accessed with an additional micropuncture kit ultimately allowing placement of an additional 6 Fr 10 cm vascular sheath. Ultrasound images were saved for procedural documentation purposes. With the use of a Bentson wire, an angled pulmonary catheter was advanced into the main pulmonary artery and a limited central pulmonary arteriogram was performed. Pressure measurements were then obtained from the main pulmonary artery. A 5 Fr angled pigtail catheter was advanced into the distal branch of the left lower lobe pulmonary artery. Limited contrast injection confirmed appropriate positioning. Over an exchange length Pollyann Kennedy, the catheter was exchanged for a 90/10 cm multi side-hole infusion catheter. The procedure was then repeated on the patient's RIGHT pulmonary artery, and with the use of a stiff Amplatz, a vertebral catheter was advanced into a distal branch of the RIGHT lower lobe pulmonary artery. Limited contrast injection confirmed appropriate positioning. Over an exchange length stiff Amplatz, the catheter was exchanged for a 90/10 cm multi side-hole infusion catheter. A postprocedural fluoroscopic image was obtained to document final catheter positioning. The external catheter tubing was secured at the right thigh and the lytic therapy was initiated. The patient tolerated the procedure well  without immediate postprocedural complication. FINDINGS: Central pulmonary arteriogram demonstrates significant embolic burden at the BILATERAL pulmonary arterial bifurcations. Acquired pressure measurements: Main pulmonary artery 32/17; mean 23 (normal: < 25/10) Following the procedure, both infusion catheter tips terminate within the distal aspects of the bilateral lower lobe sub segmental pulmonary arteries. IMPRESSION: 1. Successful initiation of bilateral catheter directed pulmonary arterial lysis for submassive pulmonary embolism. 2. Significant embolic burden at the BILATERAL pulmonary arterial bifurcations 3. Elevated pressure measurements within the main pulmonary artery, compatible with pulmonary arterial hypertension. PLAN: Lytic catheter infusion rates as described per order set. Vascular Interventional Radiology will follow the patient with anticipated catheter removal during A.M. rounds. Roanna Banning, MD [facility] Radiology Electronically Signed   By: Roanna Banning M.D.   On: 12/21/2022 16:18   IR INFUSION THROMBOL ARTERIAL INITIAL (MS)  Result Date: 12/21/2022 INDICATION: Saddle embolus.  Submassive PE burden. EXAM: Procedures: 1. PULMONARY ARTERIOGRAPHY 2. PULMONARY ARTERIAL LYTIC INFUSION CATHETER PLACEMENT, BILATERAL COMPARISON:  CTA PE, 12/21/2022 and 06/03/2016. MEDICATIONS: Zofran 4 mg IV ANESTHESIA/SEDATION: Moderate (conscious) sedation was employed during this procedure. A total of Versed 1.5 mg and Fentanyl 75 mcg was administered intravenously. Moderate Sedation Time: 34 minutes. The patient's level of consciousness and vital signs were monitored continuously by radiology nursing throughout the procedure under my direct supervision. CONTRAST:  40mL OMNIPAQUE IOHEXOL 300 MG/ML  SOLN FLUOROSCOPY TIME:  Fluoroscopic dose; 38 mGy COMPLICATIONS: None immediate. TECHNIQUE: Informed written consent was obtained from the patient and/or patient's  representative after a discussion of the risks, benefits and alternatives to treatment. Questions regarding the procedure were encouraged and answered. A timeout was performed prior to the initiation of the procedure. Ultrasound scanning was performed of the RIGHT groin and demonstrated patency of the RIGHT common femoral vein. As such, the right common femoral vein was selected venous access. The RIGHT groin was prepped and draped in the usual sterile fashion, and a sterile drape was applied covering the operative field. Maximum barrier sterile technique with sterile gowns and gloves were used for the procedure. A timeout was performed prior to the initiation of the procedure. Local anesthesia was provided with 1% lidocaine. Under direct ultrasound guidance, the right common femoral vein was accessed with a micro puncture kit ultimately allowing placement of a 6 Fr 10 cm vascular sheath. Slightly cranial to this initial access, the right common femoral was again accessed with an additional micropuncture kit ultimately allowing placement of an additional 6 Fr 10 cm vascular sheath. Ultrasound images were saved for procedural documentation purposes. With the use of a Bentson wire, an angled pulmonary catheter was advanced into the main pulmonary artery and a limited central pulmonary arteriogram was performed. Pressure measurements were then obtained from the main pulmonary artery. A 5 Fr angled pigtail catheter was advanced into the distal branch of the left lower lobe pulmonary artery. Limited contrast injection confirmed appropriate positioning. Over an exchange length Pollyann Kennedy, the catheter was exchanged for a 90/10 cm multi side-hole infusion catheter. The procedure was then repeated on the patient's RIGHT pulmonary artery, and with the use of a stiff Amplatz, a vertebral catheter was advanced into a distal branch of the RIGHT lower lobe pulmonary artery. Limited contrast injection confirmed appropriate  positioning. Over an exchange length stiff Amplatz, the catheter was exchanged for a 90/10 cm multi side-hole infusion catheter. A postprocedural fluoroscopic image was obtained to document final catheter positioning. The external catheter tubing was secured at the right thigh and the  lytic therapy was initiated. The patient tolerated the procedure well without immediate postprocedural complication. FINDINGS: Central pulmonary arteriogram demonstrates significant embolic burden at the BILATERAL pulmonary arterial bifurcations. Acquired pressure measurements: Main pulmonary artery 32/17; mean 23 (normal: < 25/10) Following the procedure, both infusion catheter tips terminate within the distal aspects of the bilateral lower lobe sub segmental pulmonary arteries. IMPRESSION: 1. Successful initiation of bilateral catheter directed pulmonary arterial lysis for submassive pulmonary embolism. 2. Significant embolic burden at the BILATERAL pulmonary arterial bifurcations 3. Elevated pressure measurements within the main pulmonary artery, compatible with pulmonary arterial hypertension. PLAN: Lytic catheter infusion rates as described per order set. Vascular Interventional Radiology will follow the patient with anticipated catheter removal during A.M. rounds. Roanna Banning, MD [facility] Radiology Electronically Signed   By: Roanna Banning M.D.   On: 12/21/2022 16:18   IR US Guide Vasc Access Right  Result Date: 12/21/2022 INDICATION: Saddle embolus.  Submassive PE burden. EXAM: Procedures: 1. PULMONARY ARTERIOGRAPHY 2. PULMONARY ARTERIAL LYTIC INFUSION CATHETER PLACEMENT, BILATERAL COMPARISON:  CTA PE, 12/21/2022 and 06/03/2016. MEDICATIONS: Zofran 4 mg IV ANESTHESIA/SEDATION: Moderate (conscious) sedation was employed during this procedure. A total of Versed 1.5 mg and Fentanyl 75 mcg was administered intravenously. Moderate Sedation Time: 34 minutes. The patient's level of  consciousness and vital signs were monitored continuously by radiology nursing throughout the procedure under my direct supervision. CONTRAST:  40mL OMNIPAQUE IOHEXOL 300 MG/ML  SOLN FLUOROSCOPY TIME:  Fluoroscopic dose; 38 mGy COMPLICATIONS: None immediate. TECHNIQUE: Informed written consent was obtained from the patient and/or patient's representative after a discussion of the risks, benefits and alternatives to treatment. Questions regarding the procedure were encouraged and answered. A timeout was performed prior to the initiation of the procedure. Ultrasound scanning was performed of the RIGHT groin and demonstrated patency of the RIGHT common femoral vein. As such, the right common femoral vein was selected venous access. The RIGHT groin was prepped and draped in the usual sterile fashion, and a sterile drape was applied covering the operative field. Maximum barrier sterile technique with sterile gowns and gloves were used for the procedure. A timeout was performed prior to the initiation of the procedure. Local anesthesia was provided with 1% lidocaine. Under direct ultrasound guidance, the right common femoral vein was accessed with a micro puncture kit ultimately allowing placement of a 6 Fr 10 cm vascular sheath. Slightly cranial to this initial access, the right common femoral was again accessed with an additional micropuncture kit ultimately allowing placement of an additional 6 Fr 10 cm vascular sheath. Ultrasound images were saved for procedural documentation purposes. With the use of a Bentson wire, an angled pulmonary catheter was advanced into the main pulmonary artery and a limited central pulmonary arteriogram was performed. Pressure measurements were then obtained from the main pulmonary artery. A 5 Fr angled pigtail catheter was advanced into the distal branch of the left lower lobe pulmonary artery. Limited contrast injection confirmed appropriate positioning. Over an exchange length Pollyann Kennedy,  the catheter was exchanged for a 90/10 cm multi side-hole infusion catheter. The procedure was then repeated on the patient's RIGHT pulmonary artery, and with the use of a stiff Amplatz, a vertebral catheter was advanced into a distal branch of the RIGHT lower lobe pulmonary artery. Limited contrast injection confirmed appropriate positioning. Over an exchange length stiff Amplatz, the catheter was exchanged for a 90/10 cm multi side-hole infusion catheter. A postprocedural fluoroscopic image was obtained to document final catheter positioning. The external  catheter tubing was secured at the right thigh and the lytic therapy was initiated. The patient tolerated the procedure well without immediate postprocedural complication. FINDINGS: Central pulmonary arteriogram demonstrates significant embolic burden at the BILATERAL pulmonary arterial bifurcations. Acquired pressure measurements: Main pulmonary artery 32/17; mean 23 (normal: < 25/10) Following the procedure, both infusion catheter tips terminate within the distal aspects of the bilateral lower lobe sub segmental pulmonary arteries. IMPRESSION: 1. Successful initiation of bilateral catheter directed pulmonary arterial lysis for submassive pulmonary embolism. 2. Significant embolic burden at the BILATERAL pulmonary arterial bifurcations 3. Elevated pressure measurements within the main pulmonary artery, compatible with pulmonary arterial hypertension. PLAN: Lytic catheter infusion rates as described per order set. Vascular Interventional Radiology will follow the patient with anticipated catheter removal during A.M. rounds. Roanna Banning, MD [facility] Special[facility] Radiology Electronically Signed   By: Roanna Banning M.D.   On: 12/21/2022 16:18   IR Angiogram Selective Each Additional Vessel  Result Date: 12/21/2022 INDICATION: Saddle embolus.  Submassive PE burden. EXAM: Procedures: 1. PULMONARY ARTERIOGRAPHY 2. PULMONARY  ARTERIAL LYTIC INFUSION CATHETER PLACEMENT, BILATERAL COMPARISON:  CTA PE, 12/21/2022 and 06/03/2016. MEDICATIONS: Zofran 4 mg IV ANESTHESIA/SEDATION: Moderate (conscious) sedation was employed during this procedure. A total of Versed 1.5 mg and Fentanyl 75 mcg was administered intravenously. Moderate Sedation Time: 34 minutes. The patient's level of consciousness and vital signs were monitored continuously by radiology nursing throughout the procedure under my direct supervision. CONTRAST:  40mL OMNIPAQUE IOHEXOL 300 MG/ML  SOLN FLUOROSCOPY TIME:  Fluoroscopic dose; 38 mGy COMPLICATIONS: None immediate. TECHNIQUE: Informed written consent was obtained from the patient and/or patient's representative after a discussion of the risks, benefits and alternatives to treatment. Questions regarding the procedure were encouraged and answered. A timeout was performed prior to the initiation of the procedure. Ultrasound scanning was performed of the RIGHT groin and demonstrated patency of the RIGHT common femoral vein. As such, the right common femoral vein was selected venous access. The RIGHT groin was prepped and draped in the usual sterile fashion, and a sterile drape was applied covering the operative field. Maximum barrier sterile technique with sterile gowns and gloves were used for the procedure. A timeout was performed prior to the initiation of the procedure. Local anesthesia was provided with 1% lidocaine. Under direct ultrasound guidance, the right common femoral vein was accessed with a micro puncture kit ultimately allowing placement of a 6 Fr 10 cm vascular sheath. Slightly cranial to this initial access, the right common femoral was again accessed with an additional micropuncture kit ultimately allowing placement of an additional 6 Fr 10 cm vascular sheath. Ultrasound images were saved for procedural documentation purposes. With the use of a Bentson wire, an angled pulmonary catheter was advanced into the main  pulmonary artery and a limited central pulmonary arteriogram was performed. Pressure measurements were then obtained from the main pulmonary artery. A 5 Fr angled pigtail catheter was advanced into the distal branch of the left lower lobe pulmonary artery. Limited contrast injection confirmed appropriate positioning. Over an exchange length Pollyann Kennedy, the catheter was exchanged for a 90/10 cm multi side-hole infusion catheter. The procedure was then repeated on the patient's RIGHT pulmonary artery, and with the use of a stiff Amplatz, a vertebral catheter was advanced into a distal branch of the RIGHT lower lobe pulmonary artery. Limited contrast injection confirmed appropriate positioning. Over an exchange length stiff Amplatz, the catheter was exchanged for a 90/10 cm multi side-hole infusion catheter. A postprocedural fluoroscopic  image was obtained to document final catheter positioning. The external catheter tubing was secured at the right thigh and the lytic therapy was initiated. The patient tolerated the procedure well without immediate postprocedural complication. FINDINGS: Central pulmonary arteriogram demonstrates significant embolic burden at the BILATERAL pulmonary arterial bifurcations. Acquired pressure measurements: Main pulmonary artery 32/17; mean 23 (normal: < 25/10) Following the procedure, both infusion catheter tips terminate within the distal aspects of the bilateral lower lobe sub segmental pulmonary arteries. IMPRESSION: 1. Successful initiation of bilateral catheter directed pulmonary arterial lysis for submassive pulmonary embolism. 2. Significant embolic burden at the BILATERAL pulmonary arterial bifurcations 3. Elevated pressure measurements within the main pulmonary artery, compatible with pulmonary arterial hypertension. PLAN: Lytic catheter infusion rates as described per order set. Vascular Interventional Radiology will follow the patient with anticipated catheter removal during A.M.  rounds. Roanna Banning, MD [facility] Inter[facility] Radiology Specialists [facility] Radiology Electronically Signed   By: Roanna Banning M.D.   On: 12/21/2022 16:18   IR Angiogram Selective Each Additional Vessel  Result Date: 12/21/2022 INDICATION: Saddle embolus.  Submassive PE burden. EXAM: Procedures: 1. PULMONARY ARTERIOGRAPHY 2. PULMONARY ARTERIAL LYTIC INFUSION CATHETER PLACEMENT, BILATERAL COMPARISON:  CTA PE, 12/21/2022 and 06/03/2016. MEDICATIONS: Zofran 4 mg IV ANESTHESIA/SEDATION: Moderate (conscious) sedation was employed during this procedure. A total of Versed 1.5 mg and Fentanyl 75 mcg was administered intravenously. Moderate Sedation Time: 34 minutes. The patient's level of consciousness and vital signs were monitored continuously by radiology nursing throughout the procedure under my direct supervision. CONTRAST:  40mL OMNIPAQUE IOHEXOL 300 MG/ML  SOLN FLUOROSCOPY TIME:  Fluoroscopic dose; 38 mGy COMPLICATIONS: None immediate. TECHNIQUE: Informed written consent was obtained from the patient and/or patient's representative after a discussion of the risks, benefits and alternatives to treatment. Questions regarding the procedure were encouraged and answered. A timeout was performed prior to the initiation of the procedure. Ultrasound scanning was performed of the RIGHT groin and demonstrated patency of the RIGHT common femoral vein. As such, the right common femoral vein was selected venous access. The RIGHT groin was prepped and draped in the usual sterile fashion, and a sterile drape was applied covering the operative field. Maximum barrier sterile technique with sterile gowns and gloves were used for the procedure. A timeout was performed prior to the initiation of the procedure. Local anesthesia was provided with 1% lidocaine. Under direct ultrasound guidance, the right common femoral vein was accessed with a micro puncture kit ultimately allowing placement of a 6 Fr 10 cm vascular sheath.  Slightly cranial to this initial access, the right common femoral was again accessed with an additional micropuncture kit ultimately allowing placement of an additional 6 Fr 10 cm vascular sheath. Ultrasound images were saved for procedural documentation purposes. With the use of a Bentson wire, an angled pulmonary catheter was advanced into the main pulmonary artery and a limited central pulmonary arteriogram was performed. Pressure measurements were then obtained from the main pulmonary artery. A 5 Fr angled pigtail catheter was advanced into the distal branch of the left lower lobe pulmonary artery. Limited contrast injection confirmed appropriate positioning. Over an exchange length Pollyann Kennedy, the catheter was exchanged for a 90/10 cm multi side-hole infusion catheter. The procedure was then repeated on the patient's RIGHT pulmonary artery, and with the use of a stiff Amplatz, a vertebral catheter was advanced into a distal branch of the RIGHT lower lobe pulmonary artery. Limited contrast injection confirmed appropriate positioning. Over an exchange length stiff Amplatz, the catheter was exchanged for  a 90/10 cm multi side-hole infusion catheter. A postprocedural fluoroscopic image was obtained to document final catheter positioning. The external catheter tubing was secured at the right thigh and the lytic therapy was initiated. The patient tolerated the procedure well without immediate postprocedural complication. FINDINGS: Central pulmonary arteriogram demonstrates significant embolic burden at the BILATERAL pulmonary arterial bifurcations. Acquired pressure measurements: Main pulmonary artery 32/17; mean 23 (normal: < 25/10) Following the procedure, both infusion catheter tips terminate within the distal aspects of the bilateral lower lobe sub segmental pulmonary arteries. IMPRESSION: 1. Successful initiation of bilateral catheter directed pulmonary arterial lysis for submassive pulmonary embolism. 2. Significant  embolic burden at the BILATERAL pulmonary arterial bifurcations 3. Elevated pressure measurements within the main pulmonary artery, compatible with pulmonary arterial hypertension. PLAN: Lytic catheter infusion rates as described per order set. Vascular Interventional Radiology will follow the patient with anticipated catheter removal during A.M. rounds. Roanna Banning, MD [facility] Radiology Electronically Signed   By: Roanna Banning M.D.   On: 12/21/2022 16:18   IR Angiogram Pulmonary Bilateral Selective  Result Date: 12/21/2022 INDICATION: Saddle embolus.  Submassive PE burden. EXAM: Procedures: 1. PULMONARY ARTERIOGRAPHY 2. PULMONARY ARTERIAL LYTIC INFUSION CATHETER PLACEMENT, BILATERAL COMPARISON:  CTA PE, 12/21/2022 and 06/03/2016. MEDICATIONS: Zofran 4 mg IV ANESTHESIA/SEDATION: Moderate (conscious) sedation was employed during this procedure. A total of Versed 1.5 mg and Fentanyl 75 mcg was administered intravenously. Moderate Sedation Time: 34 minutes. The patient's level of consciousness and vital signs were monitored continuously by radiology nursing throughout the procedure under my direct supervision. CONTRAST:  40mL OMNIPAQUE IOHEXOL 300 MG/ML  SOLN FLUOROSCOPY TIME:  Fluoroscopic dose; 38 mGy COMPLICATIONS: None immediate. TECHNIQUE: Informed written consent was obtained from the patient and/or patient's representative after a discussion of the risks, benefits and alternatives to treatment. Questions regarding the procedure were encouraged and answered. A timeout was performed prior to the initiation of the procedure. Ultrasound scanning was performed of the RIGHT groin and demonstrated patency of the RIGHT common femoral vein. As such, the right common femoral vein was selected venous access. The RIGHT groin was prepped and draped in the usual sterile fashion, and a sterile drape was applied covering the operative field. Maximum barrier sterile  technique with sterile gowns and gloves were used for the procedure. A timeout was performed prior to the initiation of the procedure. Local anesthesia was provided with 1% lidocaine. Under direct ultrasound guidance, the right common femoral vein was accessed with a micro puncture kit ultimately allowing placement of a 6 Fr 10 cm vascular sheath. Slightly cranial to this initial access, the right common femoral was again accessed with an additional micropuncture kit ultimately allowing placement of an additional 6 Fr 10 cm vascular sheath. Ultrasound images were saved for procedural documentation purposes. With the use of a Bentson wire, an angled pulmonary catheter was advanced into the main pulmonary artery and a limited central pulmonary arteriogram was performed. Pressure measurements were then obtained from the main pulmonary artery. A 5 Fr angled pigtail catheter was advanced into the distal branch of the left lower lobe pulmonary artery. Limited contrast injection confirmed appropriate positioning. Over an exchange length Pollyann Kennedy, the catheter was exchanged for a 90/10 cm multi side-hole infusion catheter. The procedure was then repeated on the patient's RIGHT pulmonary artery, and with the use of a stiff Amplatz, a vertebral catheter was advanced into a distal branch of the RIGHT lower lobe pulmonary artery. Limited contrast injection confirmed appropriate positioning. Over an  exchange length stiff Amplatz, the catheter was exchanged for a 90/10 cm multi side-hole infusion catheter. A postprocedural fluoroscopic image was obtained to document final catheter positioning. The external catheter tubing was secured at the right thigh and the lytic therapy was initiated. The patient tolerated the procedure well without immediate postprocedural complication. FINDINGS: Central pulmonary arteriogram demonstrates significant embolic burden at the BILATERAL pulmonary arterial bifurcations. Acquired pressure  measurements: Main pulmonary artery 32/17; mean 23 (normal: < 25/10) Following the procedure, both infusion catheter tips terminate within the distal aspects of the bilateral lower lobe sub segmental pulmonary arteries. IMPRESSION: 1. Successful initiation of bilateral catheter directed pulmonary arterial lysis for submassive pulmonary embolism. 2. Significant embolic burden at the BILATERAL pulmonary arterial bifurcations 3. Elevated pressure measurements within the main pulmonary artery, compatible with pulmonary arterial hypertension. PLAN: Lytic catheter infusion rates as described per order set. Vascular Interventional Radiology will follow the patient with anticipated catheter removal during A.M. rounds. Roanna Banning, MD [facility] Radiology Electronically Signed   By: Roanna Banning M.D.   On: 12/21/2022 16:18   ECHOCARDIOGRAM COMPLETE  Result Date: 12/21/2022    ECHOCARDIOGRAM REPORT   Patient Name:   Jeffrey [name] Date of Exam: 12/21/2022 Medical Rec #:  [id number]        Height:       76.0 in Accession #:    [id number]       Weight:       235.2 lb Date of Birth:  1966/01/13        BSA:          2.373 m Patient Age:    57 years         BP:           108/89 mmHg Patient Gender: M                HR:           112 bpm. Exam Location:  Inpatient Procedure: 2D Echo, Color Doppler and Cardiac Doppler STAT ECHO Indications:    Pulmonary Embolus  History:        Patient has prior history of Echocardiogram examinations, most                 recent 09/24/2016. Pulmonary Embolus; Risk Factors:Dyslipidemia                 and hx of Polysubstance Use.  Sonographer:    Milbert Coulter Referring Phys: [id number] SUDHAM CHAND IMPRESSIONS  1. Left ventricular ejection fraction, by estimation, is 60 to 65%. The left ventricle has normal function. The left ventricle has no regional wall motion abnormalities. There is severe eccentric left ventricular hypertrophy. Left ventricular  diastolic parameters were normal. There is the interventricular septum is flattened in systole and diastole, consistent with right ventricular pressure and volume overload.  2. Right ventricular systolic function is severely reduced. The right ventricular size is severely enlarged. There is normal pulmonary artery systolic pressure.  3. Right atrial size was moderately dilated.  4. The mitral valve is normal in structure. Trivial mitral valve regurgitation. No evidence of mitral stenosis.  5. The aortic valve is tricuspid. Aortic valve regurgitation is not visualized. No aortic stenosis is present.  6. The inferior vena cava is dilated in size with <50% respiratory variability, suggesting right atrial pressure of 15 mmHg. Comparison(s): Changes from prior study are noted. Conclusion(s)/Recommendation(s): Severe RV dilation and severely decreased RV function. Elevated RAP though RVSP  normal by current measurements. Findings communicated to Dr. Merrily Pew. FINDINGS  Left Ventricle: Left ventricular ejection fraction, by estimation, is 60 to 65%. The left ventricle has normal function. The left ventricle has no regional wall motion abnormalities. The left ventricular internal cavity size was normal in size. There is  severe eccentric left ventricular hypertrophy. The interventricular septum is flattened in systole and diastole, consistent with right ventricular pressure and volume overload. Left ventricular diastolic parameters were normal. Right Ventricle: The right ventricular size is severely enlarged. Right vetricular wall thickness was not well visualized. Right ventricular systolic function is severely reduced. There is normal pulmonary artery systolic pressure. The tricuspid regurgitant velocity is 1.91 m/s, and with an assumed right atrial pressure of 15 mmHg, the estimated right ventricular systolic pressure is 29.6 mmHg. Left Atrium: Left atrial size was normal in size. Right Atrium: Right atrial size was  moderately dilated. Pericardium: There is no evidence of pericardial effusion. Presence of epicardial fat layer. Mitral Valve: The mitral valve is normal in structure. Trivial mitral valve regurgitation. No evidence of mitral valve stenosis. Tricuspid Valve: The tricuspid valve is grossly normal. Tricuspid valve regurgitation is mild . No evidence of tricuspid stenosis. Aortic Valve: The aortic valve is tricuspid. Aortic valve regurgitation is not visualized. No aortic stenosis is present. Pulmonic Valve: The pulmonic valve was not well visualized. Pulmonic valve regurgitation is not visualized. Aorta: The aortic root, ascending aorta, aortic arch and descending aorta are all structurally normal, with no evidence of dilitation or obstruction. Venous: The inferior vena cava is dilated in size with less than 50% respiratory variability, suggesting right atrial pressure of 15 mmHg. IAS/Shunts: The atrial septum is grossly normal.  LEFT VENTRICLE PLAX 2D LVIDd:         2.50 cm   Diastology LVIDs:         1.60 cm   LV e' medial:    5.66 cm/s LV PW:         1.70 cm   LV E/e' medial:  6.6 LV IVS:        2.39 cm   LV e' lateral:   5.11 cm/s LVOT diam:     2.10 cm   LV E/e' lateral: 7.3 LVOT Area:     3.46 cm  RIGHT VENTRICLE RV Basal diam:  4.90 cm RV Mid diam:    3.80 cm RV S prime:     4.57 cm/s TAPSE (M-mode): 0.9 cm LEFT ATRIUM             Index        RIGHT ATRIUM           Index LA diam:        3.00 cm 1.26 cm/m   RA Area:     25.00 cm LA Vol (A2C):   25.3 ml 10.66 ml/m  RA Volume:   89.70 ml  37.79 ml/m LA Vol (A4C):   34.1 ml 14.37 ml/m LA Biplane Vol: 31.6 ml 13.31 ml/m   AORTA Ao Root diam: 3.60 cm Ao Asc diam:  3.40 cm MITRAL VALVE               TRICUSPID VALVE MV Area (PHT): 3.54 cm    TR Peak grad:   14.6 mmHg MV Decel Time: 214 msec    TR Vmax:        191.00 cm/s MV E velocity: 37.50 cm/s MV A velocity: 54.50 cm/s  SHUNTS MV E/A ratio:  0.69  Systemic Diam: 2.10 cm Jodelle Red MD  Electronically signed by Jodelle Red MD Signature Date/Time: 12/21/2022/10:44:54 AM    Final    CT Angio Chest PE W and/or Wo Contrast  Result Date: 12/21/2022 CLINICAL DATA:  Pulmonary embolism suspected. Shortness of breath. Tachycardia. EXAM: CT ANGIOGRAPHY CHEST WITH CONTRAST TECHNIQUE: Multidetector CT imaging of the chest was performed using the standard protocol during bolus administration of intravenous contrast. Multiplanar CT image reconstructions and MIPs were obtained to evaluate the vascular anatomy. RADIATION DOSE REDUCTION: This exam was performed according to the departmental dose-optimization program which includes automated exposure control, adjustment of the mA and/or kV according to patient size and/or use of iterative reconstruction technique. CONTRAST:  75mL OMNIPAQUE IOHEXOL 350 MG/ML SOLN COMPARISON:  08/02/2022 FINDINGS: Cardiovascular: Heart size is normal. However, there is right ventricular strain with ratio of 2.05. There is massive bilateral pulmonary embolism. The aorta shows mild atherosclerotic change. Mediastinum/Nodes: No mass or lymphadenopathy. Lungs/Pleura: The lungs are clear.  No pleural effusion. Upper Abdomen: Mild fatty change of the liver. Musculoskeletal: Ordinary degenerative change of the spine. Review of the MIP images confirms the above findings. IMPRESSION: 1. Positive for acute PE with CT evidence of right heart strain (RV/LV Ratio = 2.05) consistent with at least submassive (intermediate risk) PE. The presence of right heart strain has been associated with an increased risk of morbidity and mortality. Please refer to the "Code PE Focused" order set in EPIC. 2. Critical Value/emergent results were called by telephone at the time of interpretation on 12/21/2022 at 6:55 am to provider [facility] , who verbally acknowledged these results. Aortic Atherosclerosis (ICD10-I70.0). Electronically Signed   By: Paulina Fusi M.D.   On: 12/21/2022 06:57   DG  Chest Portable 1 View  Result Date: 12/21/2022 CLINICAL DATA:  Chest pain and shortness of breath. EXAM: PORTABLE CHEST 1 VIEW COMPARISON:  08/02/2022 FINDINGS: Heart size and mediastinal contours are unremarkable. No pleural fluid, interstitial edema or airspace disease. Visualized osseous structures are unremarkable. IMPRESSION: Negative portable chest. Electronically Signed   By: Signa Kell M.D.   On: 12/21/2022 06:33    Labs:  CBC: Recent Labs    12/21/22 1351 12/21/22 2207 12/22/22 0654 12/22/22 0943  WBC 7.9 8.6 8.7 9.2  HGB 14.0 12.2* 11.7* 11.5*  HCT 40.4 35.9* 34.7* 33.5*  PLT 110* 116* 108* 114*    COAGS: Recent Labs    12/21/22 0653 12/21/22 1351 12/22/22 0024 12/22/22 0654  APTT 93* 59* 149* 103*    BMP: Recent Labs    09/14/22 0918 12/21/22 0557 12/21/22 0603 12/21/22 1351 12/22/22 0654  NA 132* 133* 136 134* 132*  K 3.6 2.6* 2.6* 3.4* 3.4*  CL 100 95* 97* 99 99  CO2 20* 21*  --  21* 23  GLUCOSE 86 316* 306* 190* 161*  BUN 16 16 17 15 13   CALCIUM 9.0 8.7*  --  8.4* 8.1*  CREATININE 0.83 1.59* 1.50* 1.35* 0.90  GFRNONAA >60 50*  --  >60 >60    LIVER FUNCTION TESTS: Recent Labs    01/06/22 0948 02/23/22 1422 09/14/22 0918 12/21/22 0557  BILITOT 0.7 0.2 0.4 0.7  AST 59* 16 21 205*  ALT 58* 13 22 105*  ALKPHOS 64 112 67 101  PROT 6.9 6.6 7.0 6.9  ALBUMIN 3.7 4.0 3.9 3.5    Assessment and Plan:  PE - post thrombolysis in IR 9/23 +Rt groin hematoma We will follow  Electronically Signed: Robet Leu, PA-C 12/22/2022, 2:20  PM   I spent a total of 15 Minutes at the the patient's bedside AND on the patient's hospital floor or unit, greater than 50% of which was counseling/coordinating care for PE Lysis; Rt groin hematoma

## 2022-12-22 NOTE — Progress Notes (Signed)
Notified by Dr. Hyacinth Meeker from [facility] that pt is ready to come out to floor. FMTS will plan to assume care at 7am 9/25.

## 2022-12-22 NOTE — Progress Notes (Signed)
Patients name and DOB were verified. The catheters were flushed and pulled back 4-5 cm, the right measured 31/11 (22) and left is 37/23 (29). Both catheters and sheathes were removed. Pressure was held for 10 minutes and a quikclot was applied. Bonita Quin RN accessed the groin.   Lizanne Erker RT (R)

## 2022-12-22 NOTE — Progress Notes (Signed)
PM Rounds  Continues to remain stable without the sheath.  His hemoglobin is stable.  Vital signs are stable.  No active bleeding the right hematoma stable.  I will transfer him to the family practice teaching service for pickup 7 AM 12/23/2022.  Will send him to telemetry floor now this afternoon on 12/22/2022.  Pulmonary critical care medicine will sign off starting 7 AM 12/23/2022 but message sent for outpatient pulmonary follow-up. /    LABS    PULMONARY Recent Labs  Lab 12/21/22 0603  TCO2 18*    CBC Recent Labs  Lab 12/21/22 2207 12/22/22 0654 12/22/22 0943  HGB 12.2* 11.7* 11.5*  HCT 35.9* 34.7* 33.5*  WBC 8.6 8.7 9.2  PLT 116* 108* 114*    COAGULATION No results for input(s): "INR" in the last 168 hours.  CARDIAC  No results for input(s): "TROPONINI" in the last 168 hours. No results for input(s): "PROBNP" in the last 168 hours.   CHEMISTRY Recent Labs  Lab 12/21/22 0557 12/21/22 0603 12/21/22 1351 12/22/22 0654  NA 133* 136 134* 132*  K 2.6* 2.6* 3.4* 3.4*  CL 95* 97* 99 99  CO2 21*  --  21* 23  GLUCOSE 316* 306* 190* 161*  BUN 16 17 15 13   CREATININE 1.59* 1.50* 1.35* 0.90  CALCIUM 8.7*  --  8.4* 8.1*  MG  --   --   --  2.1  PHOS  --   --   --  2.2*   Estimated Creatinine Clearance: 118 mL/min (by C-G formula based on SCr of 0.9 mg/dL).   LIVER Recent Labs  Lab 12/21/22 0557  AST 205*  ALT 105*  ALKPHOS 101  BILITOT 0.7  PROT 6.9  ALBUMIN 3.5     INFECTIOUS Recent Labs  Lab 12/21/22 0813 12/21/22 1351  LATICACIDVEN 3.9* 2.6*     ENDOCRINE CBG (last 3)  Recent Labs    12/22/22 0350 12/22/22 0807 12/22/22 1142  GLUCAP 99 141* 119*         IMAGING x48h  - image(s) personally visualized  -   highlighted in bold IR THROMB F/U EVAL ART/VEN FINAL DAY (MS)  Result Date: 12/22/2022 INDICATION: History of sub massive pulmonary embolism, post initiation of bilateral pulmonary arterial thrombolysis on 12/21/2022.  Patient has completed prescribed course of bilateral pulmonary arterial lytic infusion and presents today for repeat pulmonary arterial pressure measurements prior to infusion catheter removal. EXAM: PULMONARY ARTERIAL PRESSURE MEASUREMENT AND INFUSION CATHETER(S) REMOVAL COMPARISON:  Chest XR, earlier same day. IR fluoroscopy, 12/21/2022. MEDICATIONS: None CONTRAST:  None FLUOROSCOPY TIME:  None COMPLICATIONS: None immediate. TECHNIQUE: The patient was positioned supine on her hospital bed. Review of this morning's chest radiograph demonstrates unchanged positioning of bilateral pulmonary arterial infusion catheters with tip of the right-sided pulmonary arterial infusion catheter approximately 5-10 cm peripheral to the expected outflow of the main pulmonary artery. As such, the right pulmonary arterial catheter's infusion wire was removed and the multi side-hole infusion catheter was retracted to the estimated location of the main pulmonary artery. Pressure measurements were acquired from this location. At this point, the procedure was terminated. All wires, catheters and sheaths were removed from the patient. Hemostasis was achieved at the right groin access site with manual compression. A dressing was placed. The patient tolerated the procedure well without immediate postprocedural complication. FINDINGS: Acquired pressure measurements as follows: Preprocedural main pulmonary artery-32/17; mean-23 (normal: < 25/10) Postprocedural main pulmonary artery-31/12; mean-22 IMPRESSION: Successful bilateral catheter directed pulmonary arterial thrombolysis  with reduction in pressure within the main pulmonary artery. Roanna Banning, MD [facility] Radiology Electronically Signed   By: Roanna Banning M.D.   On: 12/22/2022 07:24   DG Chest Port 1 View  Result Date: 12/22/2022 CLINICAL DATA:  [id number] Post-operative state [id number] Pulmonary emboli s/p lytic catheter placement EXAM:  PORTABLE CHEST 1 VIEW COMPARISON:  Chest XR and CTA chest, 12/01/2022. IR fluoroscopy, 12/21/2022. FINDINGS: Support lines: BILATERAL pulmonary arterial catheters well positioned and unchanged from IR procedure. Cardiomediastinal silhouette is within normal limits. Lungs are well inflated. No focal consolidation or mass. No pleural effusion or pneumothorax. No interval osseous abnormality. IMPRESSION: 1. Stable positioning of BILATERAL pulmonary arterial catheters. 2. No acute cardiopulmonary process. Electronically Signed   By: Roanna Banning M.D.   On: 12/22/2022 06:13   IR INFUSION THROMBOL ARTERIAL INITIAL (MS)  Result Date: 12/21/2022 INDICATION: Saddle embolus.  Submassive PE burden. EXAM: Procedures: 1. PULMONARY ARTERIOGRAPHY 2. PULMONARY ARTERIAL LYTIC INFUSION CATHETER PLACEMENT, BILATERAL COMPARISON:  CTA PE, 12/21/2022 and 06/03/2016. MEDICATIONS: Zofran 4 mg IV ANESTHESIA/SEDATION: Moderate (conscious) sedation was employed during this procedure. A total of Versed 1.5 mg and Fentanyl 75 mcg was administered intravenously. Moderate Sedation Time: 34 minutes. The patient's level of consciousness and vital signs were monitored continuously by radiology nursing throughout the procedure under my direct supervision. CONTRAST:  40mL OMNIPAQUE IOHEXOL 300 MG/ML  SOLN FLUOROSCOPY TIME:  Fluoroscopic dose; 38 mGy COMPLICATIONS: None immediate. TECHNIQUE: Informed written consent was obtained from the patient and/or patient's representative after a discussion of the risks, benefits and alternatives to treatment. Questions regarding the procedure were encouraged and answered. A timeout was performed prior to the initiation of the procedure. Ultrasound scanning was performed of the RIGHT groin and demonstrated patency of the RIGHT common femoral vein. As such, the right common femoral vein was selected venous access. The RIGHT groin was prepped and draped in the usual sterile fashion, and a sterile drape was  applied covering the operative field. Maximum barrier sterile technique with sterile gowns and gloves were used for the procedure. A timeout was performed prior to the initiation of the procedure. Local anesthesia was provided with 1% lidocaine. Under direct ultrasound guidance, the right common femoral vein was accessed with a micro puncture kit ultimately allowing placement of a 6 Fr 10 cm vascular sheath. Slightly cranial to this initial access, the right common femoral was again accessed with an additional micropuncture kit ultimately allowing placement of an additional 6 Fr 10 cm vascular sheath. Ultrasound images were saved for procedural documentation purposes. With the use of a Bentson wire, an angled pulmonary catheter was advanced into the main pulmonary artery and a limited central pulmonary arteriogram was performed. Pressure measurements were then obtained from the main pulmonary artery. A 5 Fr angled pigtail catheter was advanced into the distal branch of the left lower lobe pulmonary artery. Limited contrast injection confirmed appropriate positioning. Over an exchange length Pollyann Kennedy, the catheter was exchanged for a 90/10 cm multi side-hole infusion catheter. The procedure was then repeated on the patient's RIGHT pulmonary artery, and with the use of a stiff Amplatz, a vertebral catheter was advanced into a distal branch of the RIGHT lower lobe pulmonary artery. Limited contrast injection confirmed appropriate positioning. Over an exchange length stiff Amplatz, the catheter was exchanged for a 90/10 cm multi side-hole infusion catheter. A postprocedural fluoroscopic image was obtained to document final catheter positioning. The external catheter tubing was secured at the right thigh and  the lytic therapy was initiated. The patient tolerated the procedure well without immediate postprocedural complication. FINDINGS: Central pulmonary arteriogram demonstrates significant embolic burden at the BILATERAL  pulmonary arterial bifurcations. Acquired pressure measurements: Main pulmonary artery 32/17; mean 23 (normal: < 25/10) Following the procedure, both infusion catheter tips terminate within the distal aspects of the bilateral lower lobe sub segmental pulmonary arteries. IMPRESSION: 1. Successful initiation of bilateral catheter directed pulmonary arterial lysis for submassive pulmonary embolism. 2. Significant embolic burden at the BILATERAL pulmonary arterial bifurcations 3. Elevated pressure measurements within the main pulmonary artery, compatible with pulmonary arterial hypertension. PLAN: Lytic catheter infusion rates as described per order set. Vascular Interventional Radiology will follow the patient with anticipated catheter removal during A.M. rounds. Roanna Banning, MD [facility] Radiology Electronically Signed   By: Roanna Banning M.D.   On: 12/21/2022 16:18   IR INFUSION THROMBOL ARTERIAL INITIAL (MS)  Result Date: 12/21/2022 INDICATION: Saddle embolus.  Submassive PE burden. EXAM: Procedures: 1. PULMONARY ARTERIOGRAPHY 2. PULMONARY ARTERIAL LYTIC INFUSION CATHETER PLACEMENT, BILATERAL COMPARISON:  CTA PE, 12/21/2022 and 06/03/2016. MEDICATIONS: Zofran 4 mg IV ANESTHESIA/SEDATION: Moderate (conscious) sedation was employed during this procedure. A total of Versed 1.5 mg and Fentanyl 75 mcg was administered intravenously. Moderate Sedation Time: 34 minutes. The patient's level of consciousness and vital signs were monitored continuously by radiology nursing throughout the procedure under my direct supervision. CONTRAST:  40mL OMNIPAQUE IOHEXOL 300 MG/ML  SOLN FLUOROSCOPY TIME:  Fluoroscopic dose; 38 mGy COMPLICATIONS: None immediate. TECHNIQUE: Informed written consent was obtained from the patient and/or patient's representative after a discussion of the risks, benefits and alternatives to treatment. Questions regarding the procedure were encouraged and  answered. A timeout was performed prior to the initiation of the procedure. Ultrasound scanning was performed of the RIGHT groin and demonstrated patency of the RIGHT common femoral vein. As such, the right common femoral vein was selected venous access. The RIGHT groin was prepped and draped in the usual sterile fashion, and a sterile drape was applied covering the operative field. Maximum barrier sterile technique with sterile gowns and gloves were used for the procedure. A timeout was performed prior to the initiation of the procedure. Local anesthesia was provided with 1% lidocaine. Under direct ultrasound guidance, the right common femoral vein was accessed with a micro puncture kit ultimately allowing placement of a 6 Fr 10 cm vascular sheath. Slightly cranial to this initial access, the right common femoral was again accessed with an additional micropuncture kit ultimately allowing placement of an additional 6 Fr 10 cm vascular sheath. Ultrasound images were saved for procedural documentation purposes. With the use of a Bentson wire, an angled pulmonary catheter was advanced into the main pulmonary artery and a limited central pulmonary arteriogram was performed. Pressure measurements were then obtained from the main pulmonary artery. A 5 Fr angled pigtail catheter was advanced into the distal branch of the left lower lobe pulmonary artery. Limited contrast injection confirmed appropriate positioning. Over an exchange length Pollyann Kennedy, the catheter was exchanged for a 90/10 cm multi side-hole infusion catheter. The procedure was then repeated on the patient's RIGHT pulmonary artery, and with the use of a stiff Amplatz, a vertebral catheter was advanced into a distal branch of the RIGHT lower lobe pulmonary artery. Limited contrast injection confirmed appropriate positioning. Over an exchange length stiff Amplatz, the catheter was exchanged for a 90/10 cm multi side-hole infusion catheter. A postprocedural  fluoroscopic image was obtained to document final catheter positioning.  The external catheter tubing was secured at the right thigh and the lytic therapy was initiated. The patient tolerated the procedure well without immediate postprocedural complication. FINDINGS: Central pulmonary arteriogram demonstrates significant embolic burden at the BILATERAL pulmonary arterial bifurcations. Acquired pressure measurements: Main pulmonary artery 32/17; mean 23 (normal: < 25/10) Following the procedure, both infusion catheter tips terminate within the distal aspects of the bilateral lower lobe sub segmental pulmonary arteries. IMPRESSION: 1. Successful initiation of bilateral catheter directed pulmonary arterial lysis for submassive pulmonary embolism. 2. Significant embolic burden at the BILATERAL pulmonary arterial bifurcations 3. Elevated pressure measurements within the main pulmonary artery, compatible with pulmonary arterial hypertension. PLAN: Lytic catheter infusion rates as described per order set. Vascular Interventional Radiology will follow the patient with anticipated catheter removal during A.M. rounds. Roanna Banning, MD [facility] Radiology Electronically Signed   By: Roanna Banning M.D.   On: 12/21/2022 16:18   IR US Guide Vasc Access Right  Result Date: 12/21/2022 INDICATION: Saddle embolus.  Submassive PE burden. EXAM: Procedures: 1. PULMONARY ARTERIOGRAPHY 2. PULMONARY ARTERIAL LYTIC INFUSION CATHETER PLACEMENT, BILATERAL COMPARISON:  CTA PE, 12/21/2022 and 06/03/2016. MEDICATIONS: Zofran 4 mg IV ANESTHESIA/SEDATION: Moderate (conscious) sedation was employed during this procedure. A total of Versed 1.5 mg and Fentanyl 75 mcg was administered intravenously. Moderate Sedation Time: 34 minutes. The patient's level of consciousness and vital signs were monitored continuously by radiology nursing throughout the procedure under my direct supervision. CONTRAST:   40mL OMNIPAQUE IOHEXOL 300 MG/ML  SOLN FLUOROSCOPY TIME:  Fluoroscopic dose; 38 mGy COMPLICATIONS: None immediate. TECHNIQUE: Informed written consent was obtained from the patient and/or patient's representative after a discussion of the risks, benefits and alternatives to treatment. Questions regarding the procedure were encouraged and answered. A timeout was performed prior to the initiation of the procedure. Ultrasound scanning was performed of the RIGHT groin and demonstrated patency of the RIGHT common femoral vein. As such, the right common femoral vein was selected venous access. The RIGHT groin was prepped and draped in the usual sterile fashion, and a sterile drape was applied covering the operative field. Maximum barrier sterile technique with sterile gowns and gloves were used for the procedure. A timeout was performed prior to the initiation of the procedure. Local anesthesia was provided with 1% lidocaine. Under direct ultrasound guidance, the right common femoral vein was accessed with a micro puncture kit ultimately allowing placement of a 6 Fr 10 cm vascular sheath. Slightly cranial to this initial access, the right common femoral was again accessed with an additional micropuncture kit ultimately allowing placement of an additional 6 Fr 10 cm vascular sheath. Ultrasound images were saved for procedural documentation purposes. With the use of a Bentson wire, an angled pulmonary catheter was advanced into the main pulmonary artery and a limited central pulmonary arteriogram was performed. Pressure measurements were then obtained from the main pulmonary artery. A 5 Fr angled pigtail catheter was advanced into the distal branch of the left lower lobe pulmonary artery. Limited contrast injection confirmed appropriate positioning. Over an exchange length Pollyann Kennedy, the catheter was exchanged for a 90/10 cm multi side-hole infusion catheter. The procedure was then repeated on the patient's RIGHT pulmonary  artery, and with the use of a stiff Amplatz, a vertebral catheter was advanced into a distal branch of the RIGHT lower lobe pulmonary artery. Limited contrast injection confirmed appropriate positioning. Over an exchange length stiff Amplatz, the catheter was exchanged for a 90/10 cm multi side-hole infusion catheter. A  postprocedural fluoroscopic image was obtained to document final catheter positioning. The external catheter tubing was secured at the right thigh and the lytic therapy was initiated. The patient tolerated the procedure well without immediate postprocedural complication. FINDINGS: Central pulmonary arteriogram demonstrates significant embolic burden at the BILATERAL pulmonary arterial bifurcations. Acquired pressure measurements: Main pulmonary artery 32/17; mean 23 (normal: < 25/10) Following the procedure, both infusion catheter tips terminate within the distal aspects of the bilateral lower lobe sub segmental pulmonary arteries. IMPRESSION: 1. Successful initiation of bilateral catheter directed pulmonary arterial lysis for submassive pulmonary embolism. 2. Significant embolic burden at the BILATERAL pulmonary arterial bifurcations 3. Elevated pressure measurements within the main pulmonary artery, compatible with pulmonary arterial hypertension. PLAN: Lytic catheter infusion rates as described per order set. Vascular Interventional Radiology will follow the patient with anticipated catheter removal during A.M. rounds. Roanna Banning, MD [facility] Radiology Electronically Signed   By: Roanna Banning M.D.   On: 12/21/2022 16:18   IR Angiogram Selective Each Additional Vessel  Result Date: 12/21/2022 INDICATION: Saddle embolus.  Submassive PE burden. EXAM: Procedures: 1. PULMONARY ARTERIOGRAPHY 2. PULMONARY ARTERIAL LYTIC INFUSION CATHETER PLACEMENT, BILATERAL COMPARISON:  CTA PE, 12/21/2022 and 06/03/2016. MEDICATIONS: Zofran 4 mg IV  ANESTHESIA/SEDATION: Moderate (conscious) sedation was employed during this procedure. A total of Versed 1.5 mg and Fentanyl 75 mcg was administered intravenously. Moderate Sedation Time: 34 minutes. The patient's level of consciousness and vital signs were monitored continuously by radiology nursing throughout the procedure under my direct supervision. CONTRAST:  40mL OMNIPAQUE IOHEXOL 300 MG/ML  SOLN FLUOROSCOPY TIME:  Fluoroscopic dose; 38 mGy COMPLICATIONS: None immediate. TECHNIQUE: Informed written consent was obtained from the patient and/or patient's representative after a discussion of the risks, benefits and alternatives to treatment. Questions regarding the procedure were encouraged and answered. A timeout was performed prior to the initiation of the procedure. Ultrasound scanning was performed of the RIGHT groin and demonstrated patency of the RIGHT common femoral vein. As such, the right common femoral vein was selected venous access. The RIGHT groin was prepped and draped in the usual sterile fashion, and a sterile drape was applied covering the operative field. Maximum barrier sterile technique with sterile gowns and gloves were used for the procedure. A timeout was performed prior to the initiation of the procedure. Local anesthesia was provided with 1% lidocaine. Under direct ultrasound guidance, the right common femoral vein was accessed with a micro puncture kit ultimately allowing placement of a 6 Fr 10 cm vascular sheath. Slightly cranial to this initial access, the right common femoral was again accessed with an additional micropuncture kit ultimately allowing placement of an additional 6 Fr 10 cm vascular sheath. Ultrasound images were saved for procedural documentation purposes. With the use of a Bentson wire, an angled pulmonary catheter was advanced into the main pulmonary artery and a limited central pulmonary arteriogram was performed. Pressure measurements were then obtained from the  main pulmonary artery. A 5 Fr angled pigtail catheter was advanced into the distal branch of the left lower lobe pulmonary artery. Limited contrast injection confirmed appropriate positioning. Over an exchange length Pollyann Kennedy, the catheter was exchanged for a 90/10 cm multi side-hole infusion catheter. The procedure was then repeated on the patient's RIGHT pulmonary artery, and with the use of a stiff Amplatz, a vertebral catheter was advanced into a distal branch of the RIGHT lower lobe pulmonary artery. Limited contrast injection confirmed appropriate positioning. Over an exchange length stiff Amplatz, the catheter was  exchanged for a 90/10 cm multi side-hole infusion catheter. A postprocedural fluoroscopic image was obtained to document final catheter positioning. The external catheter tubing was secured at the right thigh and the lytic therapy was initiated. The patient tolerated the procedure well without immediate postprocedural complication. FINDINGS: Central pulmonary arteriogram demonstrates significant embolic burden at the BILATERAL pulmonary arterial bifurcations. Acquired pressure measurements: Main pulmonary artery 32/17; mean 23 (normal: < 25/10) Following the procedure, both infusion catheter tips terminate within the distal aspects of the bilateral lower lobe sub segmental pulmonary arteries. IMPRESSION: 1. Successful initiation of bilateral catheter directed pulmonary arterial lysis for submassive pulmonary embolism. 2. Significant embolic burden at the BILATERAL pulmonary arterial bifurcations 3. Elevated pressure measurements within the main pulmonary artery, compatible with pulmonary arterial hypertension. PLAN: Lytic catheter infusion rates as described per order set. Vascular Interventional Radiology will follow the patient with anticipated catheter removal during A.M. rounds. Roanna Banning, MD [facility] Radiology Electronically Signed   By: Roanna Banning M.D.   On: 12/21/2022 16:18   IR Angiogram Selective Each Additional Vessel  Result Date: 12/21/2022 INDICATION: Saddle embolus.  Submassive PE burden. EXAM: Procedures: 1. PULMONARY ARTERIOGRAPHY 2. PULMONARY ARTERIAL LYTIC INFUSION CATHETER PLACEMENT, BILATERAL COMPARISON:  CTA PE, 12/21/2022 and 06/03/2016. MEDICATIONS: Zofran 4 mg IV ANESTHESIA/SEDATION: Moderate (conscious) sedation was employed during this procedure. A total of Versed 1.5 mg and Fentanyl 75 mcg was administered intravenously. Moderate Sedation Time: 34 minutes. The patient's level of consciousness and vital signs were monitored continuously by radiology nursing throughout the procedure under my direct supervision. CONTRAST:  40mL OMNIPAQUE IOHEXOL 300 MG/ML  SOLN FLUOROSCOPY TIME:  Fluoroscopic dose; 38 mGy COMPLICATIONS: None immediate. TECHNIQUE: Informed written consent was obtained from the patient and/or patient's representative after a discussion of the risks, benefits and alternatives to treatment. Questions regarding the procedure were encouraged and answered. A timeout was performed prior to the initiation of the procedure. Ultrasound scanning was performed of the RIGHT groin and demonstrated patency of the RIGHT common femoral vein. As such, the right common femoral vein was selected venous access. The RIGHT groin was prepped and draped in the usual sterile fashion, and a sterile drape was applied covering the operative field. Maximum barrier sterile technique with sterile gowns and gloves were used for the procedure. A timeout was performed prior to the initiation of the procedure. Local anesthesia was provided with 1% lidocaine. Under direct ultrasound guidance, the right common femoral vein was accessed with a micro puncture kit ultimately allowing placement of a 6 Fr 10 cm vascular sheath. Slightly cranial to this initial access, the right common femoral was again accessed with an additional micropuncture kit  ultimately allowing placement of an additional 6 Fr 10 cm vascular sheath. Ultrasound images were saved for procedural documentation purposes. With the use of a Bentson wire, an angled pulmonary catheter was advanced into the main pulmonary artery and a limited central pulmonary arteriogram was performed. Pressure measurements were then obtained from the main pulmonary artery. A 5 Fr angled pigtail catheter was advanced into the distal branch of the left lower lobe pulmonary artery. Limited contrast injection confirmed appropriate positioning. Over an exchange length Pollyann Kennedy, the catheter was exchanged for a 90/10 cm multi side-hole infusion catheter. The procedure was then repeated on the patient's RIGHT pulmonary artery, and with the use of a stiff Amplatz, a vertebral catheter was advanced into a distal branch of the RIGHT lower lobe pulmonary artery. Limited contrast injection confirmed appropriate  positioning. Over an exchange length stiff Amplatz, the catheter was exchanged for a 90/10 cm multi side-hole infusion catheter. A postprocedural fluoroscopic image was obtained to document final catheter positioning. The external catheter tubing was secured at the right thigh and the lytic therapy was initiated. The patient tolerated the procedure well without immediate postprocedural complication. FINDINGS: Central pulmonary arteriogram demonstrates significant embolic burden at the BILATERAL pulmonary arterial bifurcations. Acquired pressure measurements: Main pulmonary artery 32/17; mean 23 (normal: < 25/10) Following the procedure, both infusion catheter tips terminate within the distal aspects of the bilateral lower lobe sub segmental pulmonary arteries. IMPRESSION: 1. Successful initiation of bilateral catheter directed pulmonary arterial lysis for submassive pulmonary embolism. 2. Significant embolic burden at the BILATERAL pulmonary arterial bifurcations 3. Elevated pressure measurements within the main  pulmonary artery, compatible with pulmonary arterial hypertension. PLAN: Lytic catheter infusion rates as described per order set. Vascular Interventional Radiology will follow the patient with anticipated catheter removal during A.M. rounds. Roanna Banning, MD [facility] Radiology Electronically Signed   By: Roanna Banning M.D.   On: 12/21/2022 16:18   IR Angiogram Pulmonary Bilateral Selective  Result Date: 12/21/2022 INDICATION: Saddle embolus.  Submassive PE burden. EXAM: Procedures: 1. PULMONARY ARTERIOGRAPHY 2. PULMONARY ARTERIAL LYTIC INFUSION CATHETER PLACEMENT, BILATERAL COMPARISON:  CTA PE, 12/21/2022 and 06/03/2016. MEDICATIONS: Zofran 4 mg IV ANESTHESIA/SEDATION: Moderate (conscious) sedation was employed during this procedure. A total of Versed 1.5 mg and Fentanyl 75 mcg was administered intravenously. Moderate Sedation Time: 34 minutes. The patient's level of consciousness and vital signs were monitored continuously by radiology nursing throughout the procedure under my direct supervision. CONTRAST:  40mL OMNIPAQUE IOHEXOL 300 MG/ML  SOLN FLUOROSCOPY TIME:  Fluoroscopic dose; 38 mGy COMPLICATIONS: None immediate. TECHNIQUE: Informed written consent was obtained from the patient and/or patient's representative after a discussion of the risks, benefits and alternatives to treatment. Questions regarding the procedure were encouraged and answered. A timeout was performed prior to the initiation of the procedure. Ultrasound scanning was performed of the RIGHT groin and demonstrated patency of the RIGHT common femoral vein. As such, the right common femoral vein was selected venous access. The RIGHT groin was prepped and draped in the usual sterile fashion, and a sterile drape was applied covering the operative field. Maximum barrier sterile technique with sterile gowns and gloves were used for the procedure. A timeout was performed prior to the initiation of  the procedure. Local anesthesia was provided with 1% lidocaine. Under direct ultrasound guidance, the right common femoral vein was accessed with a micro puncture kit ultimately allowing placement of a 6 Fr 10 cm vascular sheath. Slightly cranial to this initial access, the right common femoral was again accessed with an additional micropuncture kit ultimately allowing placement of an additional 6 Fr 10 cm vascular sheath. Ultrasound images were saved for procedural documentation purposes. With the use of a Bentson wire, an angled pulmonary catheter was advanced into the main pulmonary artery and a limited central pulmonary arteriogram was performed. Pressure measurements were then obtained from the main pulmonary artery. A 5 Fr angled pigtail catheter was advanced into the distal branch of the left lower lobe pulmonary artery. Limited contrast injection confirmed appropriate positioning. Over an exchange length Pollyann Kennedy, the catheter was exchanged for a 90/10 cm multi side-hole infusion catheter. The procedure was then repeated on the patient's RIGHT pulmonary artery, and with the use of a stiff Amplatz, a vertebral catheter was advanced into a distal branch of the RIGHT  lower lobe pulmonary artery. Limited contrast injection confirmed appropriate positioning. Over an exchange length stiff Amplatz, the catheter was exchanged for a 90/10 cm multi side-hole infusion catheter. A postprocedural fluoroscopic image was obtained to document final catheter positioning. The external catheter tubing was secured at the right thigh and the lytic therapy was initiated. The patient tolerated the procedure well without immediate postprocedural complication. FINDINGS: Central pulmonary arteriogram demonstrates significant embolic burden at the BILATERAL pulmonary arterial bifurcations. Acquired pressure measurements: Main pulmonary artery 32/17; mean 23 (normal: < 25/10) Following the procedure, both infusion catheter tips terminate  within the distal aspects of the bilateral lower lobe sub segmental pulmonary arteries. IMPRESSION: 1. Successful initiation of bilateral catheter directed pulmonary arterial lysis for submassive pulmonary embolism. 2. Significant embolic burden at the BILATERAL pulmonary arterial bifurcations 3. Elevated pressure measurements within the main pulmonary artery, compatible with pulmonary arterial hypertension. PLAN: Lytic catheter infusion rates as described per order set. Vascular Interventional Radiology will follow the patient with anticipated catheter removal during A.M. rounds. Roanna Banning, MD [facility] Radiology Electronically Signed   By: Roanna Banning M.D.   On: 12/21/2022 16:18   ECHOCARDIOGRAM COMPLETE  Result Date: 12/21/2022    ECHOCARDIOGRAM REPORT   Patient Name:   Jeffrey [name] Date of Exam: 12/21/2022 Medical Rec #:  [id number]        Height:       76.0 in Accession #:    [id number]       Weight:       235.2 lb Date of Birth:  1965/12/03        BSA:          2.373 m Patient Age:    57 years         BP:           108/89 mmHg Patient Gender: M                HR:           112 bpm. Exam Location:  Inpatient Procedure: 2D Echo, Color Doppler and Cardiac Doppler STAT ECHO Indications:    Pulmonary Embolus  History:        Patient has prior history of Echocardiogram examinations, most                 recent 09/24/2016. Pulmonary Embolus; Risk Factors:Dyslipidemia                 and hx of Polysubstance Use.  Sonographer:    Milbert Coulter Referring Phys: [id number] SUDHAM CHAND IMPRESSIONS  1. Left ventricular ejection fraction, by estimation, is 60 to 65%. The left ventricle has normal function. The left ventricle has no regional wall motion abnormalities. There is severe eccentric left ventricular hypertrophy. Left ventricular diastolic parameters were normal. There is the interventricular septum is flattened in systole and diastole, consistent with right  ventricular pressure and volume overload.  2. Right ventricular systolic function is severely reduced. The right ventricular size is severely enlarged. There is normal pulmonary artery systolic pressure.  3. Right atrial size was moderately dilated.  4. The mitral valve is normal in structure. Trivial mitral valve regurgitation. No evidence of mitral stenosis.  5. The aortic valve is tricuspid. Aortic valve regurgitation is not visualized. No aortic stenosis is present.  6. The inferior vena cava is dilated in size with <50% respiratory variability, suggesting right atrial pressure of 15 mmHg. Comparison(s): Changes from prior study are noted. Conclusion(s)/Recommendation(s):  Severe RV dilation and severely decreased RV function. Elevated RAP though RVSP normal by current measurements. Findings communicated to Dr. Merrily Pew. FINDINGS  Left Ventricle: Left ventricular ejection fraction, by estimation, is 60 to 65%. The left ventricle has normal function. The left ventricle has no regional wall motion abnormalities. The left ventricular internal cavity size was normal in size. There is  severe eccentric left ventricular hypertrophy. The interventricular septum is flattened in systole and diastole, consistent with right ventricular pressure and volume overload. Left ventricular diastolic parameters were normal. Right Ventricle: The right ventricular size is severely enlarged. Right vetricular wall thickness was not well visualized. Right ventricular systolic function is severely reduced. There is normal pulmonary artery systolic pressure. The tricuspid regurgitant velocity is 1.91 m/s, and with an assumed right atrial pressure of 15 mmHg, the estimated right ventricular systolic pressure is 29.6 mmHg. Left Atrium: Left atrial size was normal in size. Right Atrium: Right atrial size was moderately dilated. Pericardium: There is no evidence of pericardial effusion. Presence of epicardial fat layer. Mitral Valve: The mitral  valve is normal in structure. Trivial mitral valve regurgitation. No evidence of mitral valve stenosis. Tricuspid Valve: The tricuspid valve is grossly normal. Tricuspid valve regurgitation is mild . No evidence of tricuspid stenosis. Aortic Valve: The aortic valve is tricuspid. Aortic valve regurgitation is not visualized. No aortic stenosis is present. Pulmonic Valve: The pulmonic valve was not well visualized. Pulmonic valve regurgitation is not visualized. Aorta: The aortic root, ascending aorta, aortic arch and descending aorta are all structurally normal, with no evidence of dilitation or obstruction. Venous: The inferior vena cava is dilated in size with less than 50% respiratory variability, suggesting right atrial pressure of 15 mmHg. IAS/Shunts: The atrial septum is grossly normal.  LEFT VENTRICLE PLAX 2D LVIDd:         2.50 cm   Diastology LVIDs:         1.60 cm   LV e' medial:    5.66 cm/s LV PW:         1.70 cm   LV E/e' medial:  6.6 LV IVS:        2.39 cm   LV e' lateral:   5.11 cm/s LVOT diam:     2.10 cm   LV E/e' lateral: 7.3 LVOT Area:     3.46 cm  RIGHT VENTRICLE RV Basal diam:  4.90 cm RV Mid diam:    3.80 cm RV S prime:     4.57 cm/s TAPSE (M-mode): 0.9 cm LEFT ATRIUM             Index        RIGHT ATRIUM           Index LA diam:        3.00 cm 1.26 cm/m   RA Area:     25.00 cm LA Vol (A2C):   25.3 ml 10.66 ml/m  RA Volume:   89.70 ml  37.79 ml/m LA Vol (A4C):   34.1 ml 14.37 ml/m LA Biplane Vol: 31.6 ml 13.31 ml/m   AORTA Ao Root diam: 3.60 cm Ao Asc diam:  3.40 cm MITRAL VALVE               TRICUSPID VALVE MV Area (PHT): 3.54 cm    TR Peak grad:   14.6 mmHg MV Decel Time: 214 msec    TR Vmax:        191.00 cm/s MV E velocity: 37.50 cm/s MV A velocity: 54.50  cm/s  SHUNTS MV E/A ratio:  0.69        Systemic Diam: 2.10 cm Jodelle Red MD Electronically signed by Jodelle Red MD Signature Date/Time: 12/21/2022/10:44:54 AM    Final    CT Angio Chest PE W and/or Wo  Contrast  Result Date: 12/21/2022 CLINICAL DATA:  Pulmonary embolism suspected. Shortness of breath. Tachycardia. EXAM: CT ANGIOGRAPHY CHEST WITH CONTRAST TECHNIQUE: Multidetector CT imaging of the chest was performed using the standard protocol during bolus administration of intravenous contrast. Multiplanar CT image reconstructions and MIPs were obtained to evaluate the vascular anatomy. RADIATION DOSE REDUCTION: This exam was performed according to the departmental dose-optimization program which includes automated exposure control, adjustment of the mA and/or kV according to patient size and/or use of iterative reconstruction technique. CONTRAST:  75mL OMNIPAQUE IOHEXOL 350 MG/ML SOLN COMPARISON:  08/02/2022 FINDINGS: Cardiovascular: Heart size is normal. However, there is right ventricular strain with ratio of 2.05. There is massive bilateral pulmonary embolism. The aorta shows mild atherosclerotic change. Mediastinum/Nodes: No mass or lymphadenopathy. Lungs/Pleura: The lungs are clear.  No pleural effusion. Upper Abdomen: Mild fatty change of the liver. Musculoskeletal: Ordinary degenerative change of the spine. Review of the MIP images confirms the above findings. IMPRESSION: 1. Positive for acute PE with CT evidence of right heart strain (RV/LV Ratio = 2.05) consistent with at least submassive (intermediate risk) PE. The presence of right heart strain has been associated with an increased risk of morbidity and mortality. Please refer to the "Code PE Focused" order set in EPIC. 2. Critical Value/emergent results were called by telephone at the time of interpretation on 12/21/2022 at 6:55 am to provider [facility] , who verbally acknowledged these results. Aortic Atherosclerosis (ICD10-I70.0). Electronically Signed   By: Paulina Fusi M.D.   On: 12/21/2022 06:57   DG Chest Portable 1 View  Result Date: 12/21/2022 CLINICAL DATA:  Chest pain and shortness of breath. EXAM: PORTABLE CHEST 1 VIEW  COMPARISON:  08/02/2022 FINDINGS: Heart size and mediastinal contours are unremarkable. No pleural fluid, interstitial edema or airspace disease. Visualized osseous structures are unremarkable. IMPRESSION: Negative portable chest. Electronically Signed   By: Signa Kell M.D.   On: 12/21/2022 06:33

## 2022-12-22 NOTE — Progress Notes (Signed)
NAME:  Jeffrey [name], MRN:  [id number], DOB:  1965-05-12, LOS: 1 ADMISSION DATE:  12/21/2022, CONSULTATION DATE:  12/21/2022 REFERRING MD:  Dr. Wallace Cullens - EDP, CHIEF COMPLAINT:  Submassive PE   History of Present Illness:  Jeffrey [name] is a 57 year old male with a past medical history significant for prior PE anticoagulated with Eliquis, DVT, HTN, HLD, sleep apnea, arthritis, and lumbar stenosis who presented to the ED 9/23 with complaints of nausea, vomiting, and pressure-like chest pain.  Patient reports symptoms began 2 days prior to admission and worsened day of arrival.  Patient reported that shortness of breath worsening with early a.m. while ambulating to restroom with associated symptoms of presyncope.  Reports pressure-like chest pain but feeling slightly improved since admission.  Patient also reports significant coughing with productive phlegm resulting in nausea and vomiting.  On ED arrival patient was seen tachypneic, tachycardic and hypotensive.  Lab work on admission significant for NA 133, K2.6, chloride 95, glucose 316, creatinine 159, anion gap 17, AST 205, ALT 105, high-sensitivity troponin 1011, BNP 110, WBC 11.4.  Given acute concern for PE CTA chest obtained and revealed acute PE with CT evidence of right heart strain, RV to LV ratio 2.05.  PCCM consulted for further management and admission as patient will likely need catheter directed lytics.  Pertinent  Medical History  PE anticoagulated with Eliquis, DVT, HTN, HLD, sleep apnea, arthritis, and lumbar stenosis   Significant Hospital Events: Including procedures, antibiotic start and stop dates in addition to other pertinent events   12/21/22 admitted with complaints of shortness of breath, chest pain, nausea and vomiting.  Found to have acute submassive PE, S/p catheter-directed Pulmonary arteriography thrombolysis; echo completed c/w right heart strain, otherwise normal   Interim History / Subjective:  As  above  Objective   Blood pressure (!) 133/98, pulse 81, temperature 98.6 F (37 C), temperature source Oral, resp. rate 19, height 6\' 4"  (1.93 m), weight 105.8 kg, SpO2 99%. PAP: (32)/(18) 32/18      Intake/Output Summary (Last 24 hours) at 12/22/2022 1042 Last data filed at 12/22/2022 0800 Gross per 24 hour  Intake 926.35 ml  Output 1275 ml  Net -348.65 ml   Filed Weights   12/21/22 0615 12/21/22 1900 12/22/22 0500  Weight: 106.7 kg 105.8 kg 105.8 kg   Labs:  Na 132, K 3.4, phos 2.2 Hgb 11.7, platelets 108  Examination: Well-appearing, no acute distress Cardio: Regular rate, regular rhythm, no murmurs on exam. Pulm: Clear, no wheezing, no crackles. No increased work of breathing Abdominal: bowel sounds present, soft, non-tender, non-distended Extremities: no peripheral edema, mild-moderate edema surrounding the catheter site Neuro: alert and oriented x3, speech normal in content, no facial asymmetry, strength intact and equal bilaterally in UE and LE, pupils equal and reactive to light.    Resolved Hospital Problem list     Assessment & Plan:  ASSESSMENT / PLAN:  PULMONARY  Submassive PR with CT evidence of Right Heart Strain  A:  Secondary to missed anticoagulation outpatient. Prior history of DVT/PE in 2018 and currently prescribed Eliquis 5 mg BID. Had workup by Hematologist who found no provoking factors and recommended life ling anticoagulation. PESI score on admit 139 meeting high risk criteria. S/P catheter directed pulmonary thrombolysis on 9/23 by IR.   12/22/2022 -> Currently on heparin gtt, HR stable, requiring 3L LFNC weaned from 4L overnight. Echo completed showing LVEF 60-65%, severely reduced RV function with enlargement, with normal pulmonary artery systolic pressure.  P:   - Consider transitioning to Eliquis as patient is able to take PO  - Goal INR >2  - Consult to [facility] for medication assistance    NEUROLOGIC A: Neurologically intact without focal  deficits.    VASCULAR A:   S/P Catheter Directed Thrombolysis Chronic RLE DVT noncompliant with anticoagulation outpatient   P:  - continue heparin gtt  - consider transitioning to DOAC today   CARDIAC STRUCTURAL A: Right heart strain: 2/2 to submassive PE. Echo completed 9/23 showing LVEF 60-65%, severely reduced RV function with enlargement, with normal pulmonary artery systolic pressure. TTE previously normal on 08/2016.  P: - follow up for repeat TTE outpatient   CARDIAC ELECTRICAL A: Sinus Tachycardia: Resolved. Present on admission 2/2 to PE. Currently on tele in NSR with rate well controlled.  P: - continue telemetry monitoring   INFECTIOUS A:  Currently afebrile, not on antibiotics, no leukocytosis on CBC.    RENAL A:  AKI: Resolved. Creatinine on admission elevated to 1.59 with baseline of <1 outpatient. Most likely prerenal injury secondary to stress from submassive PE. Creatinine improved back to baseline at 0.9.  P:  - daily BMP  - DC IVF as patient is now on regular diet  ELECTROLYTES A:  Hypokalemia: on admission to 2.6 after repletion with IV and PO K improved to 3.4. Mag stable at 2.1.   Hypophosphatemia: 2.2 Hyponatremia: Na 132, on admission was 133. Was on 0.9% NS overnight at 20.8 mL/hr - stopped at 0648 this morning.  P: - replete with 40 mEq Klor PO  - replete with 30 mmol Kphos  - recheck BMET, Phos and Mag 9/25   GASTROINTESTINAL A:   Elevated Transaminases: on admission AST 205, ALT 105. Thought to be 2/2 to cardiac stress from submassive PE. Currently drinks around 12 drinks per week.   Motility: last BM 9/22, passing gas.  P:   - recheck CMP 9/25 to monitor LFTs  - consider cirrhosis workup outpatient  - continue Miralax daily PRN  - continue Colace 100 mg BID PRN  - limit opioid pain medication   HEMATOLOGIC   - HEME A:  Normocytic Anemia: hgb 11.7, down from 15.7 on admission. Patient denies hematochezia, hematuria, hematemesis.  Hematoma present around catheter site.  P:  - daily CBC  - monitor catheter site for swelling or bleeding - monitor for signs of bleeding  - PRBC for hgb </= 6.9gm%    - exceptions are   -  if ACS susepcted/confirmed then transfuse for hgb </= 8.0gm%,  or    -  active bleeding with hemodynamic instability, then transfuse regardless of hemoglobin value   At at all times try to transfuse 1 unit prbc as possible with exception of active hemorrhage   HEMATOLOGIC - Platelets A Thrombocytopenia: platelets 108, on admission platelets 120. Most likely 2/2 to thrombolysis on 9/23.  P - monitor with daily CBC  - goal platelets   ENDOCRINE A:   Hyperglycemia: no known history of T2DM, last A1c 5.2 on 9/23. Currently on Moderate SSI. Received 10 units correcting on 9/23.  P:   - Discontinue moderate SSI and CBG checks   MSK/DERM A:  Hematoma surrounding catheter site   P:  - Apply ice to area  - Elevate extremity  - monitor for signs of bleeding   Best Practice (right click and "Reselect all SmartList Selections" daily)   Diet/type: Regular consistency (see orders) DVT prophylaxis: systemic heparin GI prophylaxis: PPI Lines: N/A Foley:  N/A Code Status:  full code Last date of multidisciplinary goals of care discussion: Full code, continue to update patient and family daily  Labs   CBC: Recent Labs  Lab 12/21/22 0557 12/21/22 0603 12/21/22 1351 12/21/22 2207 12/22/22 0654  WBC 11.4*  --  7.9 8.6 8.7  NEUTROABS 8.5*  --   --   --   --   HGB 15.3 16.7 14.0 12.2* 11.7*  HCT 45.8 49.0 40.4 35.9* 34.7*  MCV 98.5  --  95.7 95.0 98.9  PLT 120*  --  110* 116* 108*    Basic Metabolic Panel: Recent Labs  Lab 12/21/22 0557 12/21/22 0603 12/21/22 1351 12/22/22 0654  NA 133* 136 134* 132*  K 2.6* 2.6* 3.4* 3.4*  CL 95* 97* 99 99  CO2 21*  --  21* 23  GLUCOSE 316* 306* 190* 161*  BUN 16 17 15 13   CREATININE 1.59* 1.50* 1.35* 0.90  CALCIUM 8.7*  --  8.4* 8.1*  MG  --    --   --  2.1  PHOS  --   --   --  2.2*   GFR: Estimated Creatinine Clearance: 118 mL/min (by C-G formula based on SCr of 0.9 mg/dL). Recent Labs  Lab 12/21/22 0557 12/21/22 0813 12/21/22 1351 12/21/22 2207 12/22/22 0654  WBC 11.4*  --  7.9 8.6 8.7  LATICACIDVEN  --  3.9* 2.6*  --   --     Liver Function Tests: Recent Labs  Lab 12/21/22 0557  AST 205*  ALT 105*  ALKPHOS 101  BILITOT 0.7  PROT 6.9  ALBUMIN 3.5   Recent Labs  Lab 12/21/22 0557  LIPASE 50   No results for input(s): "AMMONIA" in the last 168 hours.  ABG    Component Value Date/Time   PHART 7.326 (L) 01/14/2022 1434   PCO2ART 45.0 01/14/2022 1434   PO2ART 267 (H) 01/14/2022 1434   HCO3 23.7 01/14/2022 1434   TCO2 18 (L) 12/21/2022 0603   ACIDBASEDEF 3.0 (H) 01/14/2022 1434   O2SAT 100 01/14/2022 1434     Coagulation Profile: No results for input(s): "INR", "PROTIME" in the last 168 hours.  Cardiac Enzymes: No results for input(s): "CKTOTAL", "CKMB", "CKMBINDEX", "TROPONINI" in the last 168 hours.  HbA1C: Hemoglobin A1C  Date/Time Value Ref Range Status  01/02/2021 09:27 AM 5.6 4.0 - 5.6 % Final  10/26/2019 09:50 AM 5.5 4.0 - 5.6 % Final   Hgb A1c MFr Bld  Date/Time Value Ref Range Status  12/21/2022 08:13 AM 5.2 4.8 - 5.6 % Final    Comment:    (NOTE) Pre diabetes:          5.7%-6.4%  Diabetes:              >6.4%  Glycemic control for   <7.0% adults with diabetes     CBG: Recent Labs  Lab 12/21/22 1507 12/21/22 1921 12/21/22 2320 12/22/22 0350 12/22/22 0807  GLUCAP 162* 131* 77 99 141*    Review of Systems:   Please see the history of present illness. All other systems reviewed and are negative   Past Medical History:  He,  has a past medical history of Acute pulmonary embolus (HCC), Arthritis, DVT (deep venous thrombosis) (HCC) (11/28/2013), High cholesterol, Hypertension, Lumbar spine scoliosis, Marijuana use, and Sleep apnea.   Allergies No Known Allergies    Home Medications  Prior to Admission medications   Medication Sig Start Date End Date Taking? Authorizing Provider  amLODipine (NORVASC) 5 MG  tablet Take 1 tablet (5 mg total) by mouth at bedtime. 02/23/22  Yes Sabino Dick, DO  apixaban (ELIQUIS) 5 MG TABS tablet Take 1 tablet (5 mg total) by mouth 2 (two) times daily. 02/27/22  Yes Sowell, Apolinar Junes, MD  atorvastatin (LIPITOR) 40 MG tablet TAKE 1 TABLET BY MOUTH ONCE DAILY 12/05/21 01/10/23 Yes Sowell, Apolinar Junes, MD  DULoxetine (CYMBALTA) 30 MG capsule Take 1 capsule (30 mg total) by mouth daily. 09/25/22 12/24/22 Yes Jerre Simon, MD  hydrochlorothiazide (HYDRODIURIL) 25 MG tablet Take 1 tablet (25 mg total) by mouth daily. 08/18/22  Yes Espinoza, Myrlene Broker, DO  olmesartan (BENICAR) 40 MG tablet Take 1 tablet (40 mg total) by mouth at bedtime. 08/18/22  Yes Sabino Dick, DO  NON FORMULARY Pt uses a cpap nighlty Patient not taking: Reported on 12/21/2022    [provider]     Critical care time:   CRITICAL CARE Performed by: Lowell Guitar, DO [facility] Personal contact information can be found on Amion  If no contact or response made please call [phone] 12/22/2022, 10:42 AM

## 2022-12-22 NOTE — Hospital Course (Addendum)
Jeffrey [name] is a 57yo M w/ hx of prior PE anticoagulated with Eliquis, DVT, HTN, HLD admitted for submassive pulmonary embolism w/ acute cor pulmonale s/p PAT. Hospital course is as follows:   Overview and Major Events: 9/23: Admitted to ICU for submassive PE with right heart strain  9/23: Pulmonary arteriography thrombolysis, catheter directed 9/24: Stabilized 9/25: Transferred to FMTS 9/26: Transition from heparin gtt to DOAC  Submassive PE  Acute Cor Pulmonale Patient presented w/ 2 day hx of chest pain and SOB in setting of taking eliquis not as directed (patient directed to take Eliquis 5 BID, was taking both doses at once). CTA showed acute PE, and CT showed evidence of R heart strain. Echo showed severely reduced R ventricular systolic function and severe R ventricular enlargement, R atrial moderately dilated. Pt was admitted to ICU where IR performed catheter-directed thrombolysis w/ tPA. Pt also had elevated Troponins, including a peak of 1011. BNP initially elevated at 110.3, improved to 59.5 following thrombolysis. Patient initially on heparin gtt, successfully transitioned to Eliquis before discharge. Patient improved symptomatically, able to ambulate independently including stairs at time of discharge.   R Groin Hematoma at cath site Patient had uncomplicated hematoma at catheter puncture site. Healed well throughout admission without pain, induration, bleeding, or drainage.   Anemia On arrival patient's hemoglobin 15.3.  Throughout admission hemoglobin gradually decreased to 9.2 at the lowest, likely secondary to bleeding to the hematoma areas as above vs occult GI bleed.  No other active signs of bleeding. On day of discharge patient's hemoglobin up to 9.6.    COPD (presumed, no PFTs) Started Incruse Ellipta while inpatient. Spiriva at discharge preferred by insurance.   AKI Cr on admission 1.59 (baseline <1). Thought to be pre-renal in setting of PE. Treated w/ IVF. Cr improved to  baseline by time of discharge.   PCP Recs: 1) Consider Echo 2) Consider PFTs for suspected COPD 3) CBC at follow up  4) Needs colonoscopy 5) Consider PSA 6) Patient interested in possible smoking cessation

## 2022-12-22 NOTE — Progress Notes (Signed)
ANTICOAGULATION CONSULT NOTE ` Pharmacy Consult for IV heparin  Indication: pulmonary embolus Brief A/P: Heparin level supratherapeutic  Decrease Heparin rate  No Known Allergies  Patient Measurements: Height: 6\' 4"  (193 cm) Weight: 105.8 kg (233 lb 4 oz) IBW/kg (Calculated) : 86.8 Heparin Dosing Weight: 106.7 kg  Vital Signs: Temp: 98.6 F (37 C) (09/24 0810) Temp Source: Oral (09/24 0810) BP: 133/98 (09/24 0800) Pulse Rate: 81 (09/24 0800)  Labs: Recent Labs    12/21/22 0557 12/21/22 0603 12/21/22 9562 12/21/22 0813 12/21/22 1351 12/21/22 2207 12/22/22 0024 12/22/22 0654 12/22/22 0655  HGB 15.3 16.7  --   --  14.0 12.2*  --  11.7*  --   HCT 45.8 49.0  --   --  40.4 35.9*  --  34.7*  --   PLT 120*  --   --   --  110* 116*  --  108*  --   APTT  --   --    < >  --  59*  --  149* 103*  --   HEPARINUNFRC  --   --    < >  --  0.23*  --  0.73*  --  0.68  CREATININE 1.59* 1.50*  --   --  1.35*  --   --  0.90  --   TROPONINIHS 1,011*  --   --  1,057*  --   --   --   --   --    < > = values in this interval not displayed.    Estimated Creatinine Clearance: 118 mL/min (by C-G formula based on SCr of 0.9 mg/dL).  Assessment: 57 y.o. male admitted with new PE undergoing catheter directed lysis for heparin. Heparin level remains at upper end of therapeutic.    Goal of Therapy:  Heparin level 0.3-0.7 units/ml Monitor platelets by anticoagulation protocol: Yes   Plan:  Decrease Heparin 1600 units/hr.  Follow-up am labs tomorrow.   Cedric Fishman, PharmD, BCPS, BCCCP Clinical Pharmacist

## 2022-12-22 NOTE — Telephone Encounter (Signed)
    Alessandra Bevels -submassive PE.  Status post thrombolytic.  Will need hospital follow-up.  First available is fine anytime with a nurse practitioner.

## 2022-12-22 NOTE — Plan of Care (Signed)
Patient s/p TPA via right groin. Level 2 hematoma present since sheath removal this am. No change in size or firmness. Will continue to monitor closely. Tolerating diet well.

## 2022-12-22 NOTE — TOC CM/SW Note (Signed)
Transition of Care [facility]) - Inpatient Brief Assessment   Patient Details  Name: Jeffrey [name] MRN: [id number] Date of Birth: 06/27/1965  Transition of Care [facility]) CM/SW Contact:    Tom-Johnson, Hershal Coria, RN Phone Number: 12/22/2022, 2:01 PM   Clinical Narrative:  Patient presented to the ED with Chest Pain, SOB, N/V and Cough.  Workup shows Submassive PE with Elevated Troponin, Acute NSTEMI v/s Demand Cardiac Ischemia, Acute Respiratory Failure with Hypoxia, Hyperglycemia, Acute Kidney Injury and  Hyponatremia/hypokalemia.  PCCM was consulted for  further eval and Medical Management.  Patient underwent Pulmonary Arteriography Thrombolysis by IR yesterday 12/21/22. Currently on Heparin gtt, on Room Air.   CM went to patient's room to assess, patient requested to come another time.  Patient has Hx of DVT/PE and on Eliquis at home.    No TOC needs or recommendations noted at this time.  Patient not Medically ready for discharge.  CM will continue to follow as patient progresses with care towards discharge.        Transition of Care Asessment: Insurance and Status: Insurance coverage has been reviewed Patient has primary care physician: Yes Home environment has been reviewed: Yes Prior level of function:: Independent Prior/Current Home Services: No current home services Social Determinants of Health Reivew: SDOH reviewed no interventions necessary Readmission risk has been reviewed: Yes Transition of care needs: transition of care needs identified, TOC will continue to follow

## 2022-12-23 ENCOUNTER — Other Ambulatory Visit (HOSPITAL_COMMUNITY): Payer: Self-pay

## 2022-12-23 DIAGNOSIS — T148XXA Other injury of unspecified body region, initial encounter: Secondary | ICD-10-CM | POA: Insufficient documentation

## 2022-12-23 DIAGNOSIS — R748 Abnormal levels of other serum enzymes: Secondary | ICD-10-CM | POA: Insufficient documentation

## 2022-12-23 DIAGNOSIS — I2699 Other pulmonary embolism without acute cor pulmonale: Secondary | ICD-10-CM | POA: Diagnosis not present

## 2022-12-23 DIAGNOSIS — E872 Acidosis, unspecified: Secondary | ICD-10-CM | POA: Insufficient documentation

## 2022-12-23 DIAGNOSIS — K5900 Constipation, unspecified: Secondary | ICD-10-CM | POA: Insufficient documentation

## 2022-12-23 LAB — COMPREHENSIVE METABOLIC PANEL
ALT: 56 U/L — ABNORMAL HIGH (ref 0–44)
AST: 53 U/L — ABNORMAL HIGH (ref 15–41)
Albumin: 2.7 g/dL — ABNORMAL LOW (ref 3.5–5.0)
Alkaline Phosphatase: 58 U/L (ref 38–126)
Anion gap: 11 (ref 5–15)
BUN: 11 mg/dL (ref 6–20)
CO2: 24 mmol/L (ref 22–32)
Calcium: 8.1 mg/dL — ABNORMAL LOW (ref 8.9–10.3)
Chloride: 98 mmol/L (ref 98–111)
Creatinine, Ser: 0.9 mg/dL (ref 0.61–1.24)
GFR, Estimated: >60 mL/min (ref >60)
Glucose, Bld: 101 mg/dL — ABNORMAL HIGH (ref 70–99)
Potassium: 3.5 mmol/L (ref 3.5–5.1)
Sodium: 133 mmol/L — ABNORMAL LOW (ref 135–145)
Total Bilirubin: 0.6 mg/dL (ref 0.3–1.2)
Total Protein: 5.5 g/dL — ABNORMAL LOW (ref 6.5–8.1)

## 2022-12-23 LAB — PHOSPHORUS: Phosphorus: 2.8 mg/dL (ref 2.5–4.6)

## 2022-12-23 LAB — BRAIN NATRIURETIC PEPTIDE: B Natriuretic Peptide: 59.5 pg/mL (ref 0.0–100.0)

## 2022-12-23 LAB — MAGNESIUM: Magnesium: 2 mg/dL (ref 1.7–2.4)

## 2022-12-23 LAB — LACTIC ACID, PLASMA: Lactic Acid, Venous: 2.2 mmol/L (ref 0.5–1.9)

## 2022-12-23 LAB — HEPARIN LEVEL (UNFRACTIONATED): Heparin Unfractionated: 0.3 IU/mL (ref 0.30–0.70)

## 2022-12-23 MED ORDER — ATORVASTATIN CALCIUM 40 MG PO TABS
40.0000 mg | ORAL_TABLET | Freq: Every day | ORAL | Status: DC
  Administered 2022-12-23 – 2022-12-25 (×3): 40 mg via ORAL
  Filled 2022-12-23 (×3): qty 1

## 2022-12-23 MED ORDER — SENNA 8.6 MG PO TABS
1.0000 | ORAL_TABLET | Freq: Every day | ORAL | Status: DC
  Administered 2022-12-23 – 2022-12-24 (×2): 8.6 mg via ORAL
  Filled 2022-12-23 (×3): qty 1

## 2022-12-23 MED ORDER — ALBUTEROL SULFATE (2.5 MG/3ML) 0.083% IN NEBU: 2.5000 mg | INHALATION_SOLUTION | RESPIRATORY_TRACT | Status: DC | PRN

## 2022-12-23 MED ORDER — DULOXETINE HCL 30 MG PO CPEP
30.0000 mg | ORAL_CAPSULE | Freq: Every day | ORAL | Status: DC
  Administered 2022-12-23 – 2022-12-25 (×3): 30 mg via ORAL
  Filled 2022-12-23 (×3): qty 1

## 2022-12-23 MED ORDER — IRBESARTAN 300 MG PO TABS
300.0000 mg | ORAL_TABLET | Freq: Every day | ORAL | Status: DC
  Administered 2022-12-23 – 2022-12-25 (×3): 300 mg via ORAL
  Filled 2022-12-23 (×3): qty 1

## 2022-12-23 NOTE — Assessment & Plan Note (Signed)
Patient with now downtrending mild lactic acidosis 3.9 > 2.6 > 2.2. - Recheck lactic acid in the morning

## 2022-12-23 NOTE — Progress Notes (Signed)
Transition of Care [facility]) - Inpatient Brief Assessment   Patient Details  Name: QUADIR MAFFETT MRN: [id number] Date of Birth: [birth year]  Transition of Care [facility]) CM/SW Contact:    Janae Bridgeman, RN Phone Number: 12/23/2022, 12:32 PM   Clinical Narrative: CM met with the patient at the bedside to discuss TOC needs.  The patient lives at home with his spouse, Alcario Drought who works in admissions at [employer] [address]  Patient uses [facility] for medications.  Patient was made aware of cost savings through [facility] as well - especially for cost of inhalers.  Patient is a current smoker of cigars and states that he plans to quitting.  He states that his wife has encouraged him to stop smoking for years.  Smoking Cessation resources were provided in the AVS.  Patient has CPAP machine at home.  No other TOC needs at this time.  Patients plans to follow up with PCP for post-hospitalization visit.   Transition of Care Asessment: Insurance and Status: (P) Insurance coverage has been reviewed Patient has primary care physician: (P) Yes Home environment has been reviewed: (P) Yes Prior level of function:: (P) Independent Prior/Current Home Services: (P) No current home services Social Determinants of Health Reivew: (P) SDOH reviewed no interventions necessary Readmission risk has been reviewed: (P) Yes Transition of care needs: (P) no transition of care needs at this time

## 2022-12-23 NOTE — Progress Notes (Signed)
FMTS Interim Progress Note  S: Patient is feeling better this evening, feels more rested after getting some sleep.  He continues to have a productive cough of white/yellow phlegm without blood.  He continues to have some chest soreness when taking deep breaths, with episodic left lateral flank pain in the axillary line.  This pain intermittently occurs when coughing or taking deep breaths, and is resolved with exhaling.  These pain episodes occur every 4 hours or so and only last for the duration of a breath.  No sharp anterior chest pain, no pain radiation to the left arm.  No vision changes or headache.  He reports no issues with right groin hematoma.  He has not noticed enlargement, redness, drainage, or hardness of the area.  He was able to ambulate without pain to the area.  O: BP 121/88 (BP Location: Right Arm)   Pulse 74   Temp 98.2 F (36.8 C) (Oral)   Resp 17   Ht 6\' 4"  (1.93 m)   Wt 105.8 kg   SpO2 99%   BMI 28.39 kg/m    General: Well-appearing. Resting comfortably in room. CV: Normal S1/S2. No extra heart sounds. Warm and well-perfused. Pulm: Breathing comfortably on room air. Diffuse expiratory crackles and scattered wheezes. Transmitted upper airway sounds may be present. No increased WOB. Abd: Soft, non-tender, non-distended. Skin: Stable purple hematoma in R groin around catheter insertion site. No increase in size or warmth. Some soft, palpable tissue thickening lateral to the hematoma, no stiff induration. Covered puncture site without visible bleeding or drainage. No pain to palpation of hematoma.  Psych: Pleasant and appropriate.   A/P: Recommend night team to also f/u on R groin hematoma for any changes. Otherwise, continue treatment plan per today's day team progress note.   Ivery Quale, MD 12/23/2022, 6:59 PM PGY-1, [facility] Service pager [phone]

## 2022-12-23 NOTE — Progress Notes (Signed)
Daily Progress Note Intern Pager: [phone]  [name] Medical record number: [id number] Date of birth: 26-Jun-1965 Age: 57 y.o. Gender: male  Primary Care Provider: Bess Kinds, MD Consultants: CCM, Interventional Radiology  Code Status: Full  Pt Overview and Major Events to Date:  9/23: Admitted to ICU for submassive PE with right heart strain  9/23: Pulmonary arteriography thrombolysis, catheter directed  Assessment and Plan:  Jeffrey [name] is a 57yo M with hx of PE, DVT (on eliquis), HTN, HLD transferring from ICU following mechanical thrombolysis of submassive PE. Patient is symptomatically improving at this time, with healing hematoma at R groin puncture site. Currently on heparin drip, pending de-escalation to DOAC. Assessment & Plan PE (pulmonary thromboembolism) (HCC) Patient presented with submassive PE with right heart strain s/p targeted thrombolysis with symptomatic improvement. BNP this am within normal limits. Patient on heparin drip and satting well on room air. IR following. - Admit to FMTS morning of 9/25 - Follow-up IR recs - Continue heparin drip - Consider starting Eliquis DOAC tomorrow in the setting of thigh hematoma - Albuterol nebs q4 prn. O2 as needed.  - PT/OT eval and treat - Outpatient pulm fu Hematoma Healing hematoma surrounding peripheral thrombolysis catheter insertion site in the right groin. No signs of infection or other complications at this time. - Puncture site care - CTM Constipation Patient notes he has not had a bowel movement since coming to the hospital. - Miralax and senna bowel regimen - CTM Lactic acidosis Patient with now downtrending mild lactic acidosis 3.9 > 2.6 > 2.2. - Recheck lactic acid in the morning Elevated liver enzymes Patient with mildly elevated transaminases. ALT 56, AST 53. No abdominal symptoms at this time other than constipation. - AM CMP - CTM  Chronic and Stable  Issues: HTN: Restart Olmesartan 40, holding home amlodipine 5, hydrochlorothiazide 25 HLD: Restart home atorvastatin 40  Mood: Restart home duloxetine 30 SDOH: Social work consulted regarding medication costs/insurance/FSA spending.   FEN/GI: Regular diet PPx: Heparin infusion, goal 0.3-0.7 units/mL Dispo: Home pending clinical improvement and transition to DOAC  Subjective:  Patient is feeling better this morning.  He feels like his cough has decreased some, continues to be productive of white-yellow phlegm, no blood.  Sometimes his chest will feel sore with deep breathing.  No sharp chest pain, no headache or vision changes.  No nausea vomiting or diarrhea.  He is feeling more energetic and hopes to get up and walk around today.  Objective: Temp:  [98.2 F (36.8 C)-99.1 F (37.3 C)] 99.1 F (37.3 C) (09/25 0736) Pulse Rate:  [69-90] 82 (09/25 0736) Resp:  [18-27] 18 (09/25 0736) BP: (114-137)/(78-94) 132/79 (09/25 0736) SpO2:  [93 %-99 %] 98 % (09/25 0736) Physical Exam: General: Well-appearing, no acute distress. Cardiovascular: Normal S1/S2.  No extra heart sounds.  Warm and well-perfused. Respiratory: Breathing comfortably on room air, coughing at times.  Expiratory crackles and scattered expiratory wheezes.  No increased work of breathing. Abdomen: Soft, nontender, nondistended. Extremities: Hematoma reducing in size at R groin catheter site without erythema, induration, or drainage.  Extremities warm, dry, nonedematous.  Laboratory: Most recent CBC Lab Results  Component Value Date   WBC 8.6 12/22/2022   HGB 11.1 (L) 12/22/2022   HCT 32.2 (L) 12/22/2022   MCV 97.9 12/22/2022   PLT 112 (L) 12/22/2022   Most recent BMP    Latest Ref Rng & Units 12/23/2022    8:08 AM  BMP  Glucose 70 - 99 mg/dL 161   BUN 6 - 20 mg/dL 11   Creatinine 0.96 - 1.24 mg/dL 0.45   Sodium 409 - 811 mmol/L 133   Potassium 3.5 - 5.1 mmol/L 3.5   Chloride 98 - 111 mmol/L 98   CO2 22 - 32  mmol/L 24   Calcium 8.9 - 10.3 mg/dL 8.1    Lab Results  Component Value Date   ALT 56 (H) 12/23/2022   AST 53 (H) 12/23/2022   ALKPHOS 58 12/23/2022   BILITOT 0.6 12/23/2022   APTT 103 Firbinogen 388 BNP 59.5 Lactic acid 2.2 Mg 2.0 Phos 2.8 Alk phos 58 Heparin 0.30  Ivery Quale, MD 12/23/2022, 1:14 PM  PGY-1, [facility] FPTS Intern pager: [phone], text pages welcome Secure chat group [facility] Teaching Service

## 2022-12-23 NOTE — Progress Notes (Signed)
MEWS Progress Note  Patient Details Name: Jeffrey [name] MRN: [id number] DOB: 03-05-1966 Today's Date: 12/23/2022   MEWS Flowsheet Documentation:  Assess: MEWS Score Temp: 99.1 F (37.3 C) BP: 132/79 MAP (mmHg): 95 Pulse Rate: 82 ECG Heart Rate: 89 Resp: 18 Level of Consciousness: Alert SpO2: 98 % O2 Device: Room Air Patient Activity (if Appropriate): In bed O2 Flow Rate (L/min): 3 L/min Assess: MEWS Score MEWS Temp: 0 MEWS Systolic: 0 MEWS Pulse: 0 MEWS RR: 0 MEWS LOC: 0 MEWS Score: 0 MEWS Score Color: Green Assess: SIRS CRITERIA SIRS Temperature : 0 SIRS Respirations : 0 SIRS Pulse: 0 SIRS WBC: 0 SIRS Score Sum : 0          [name] 12/23/2022, 11:10 AM

## 2022-12-23 NOTE — Assessment & Plan Note (Signed)
Healing hematoma surrounding peripheral thrombolysis catheter insertion site in the right groin. No signs of infection or other complications at this time. - Puncture site care - CTM

## 2022-12-23 NOTE — Progress Notes (Addendum)
pulmonary artery. Limited contrast injection confirmed appropriate positioning. Over an exchange length stiff Amplatz, the catheter was exchanged for a 90/10 cm multi side-hole infusion catheter. A postprocedural fluoroscopic image was obtained to document final catheter positioning. The external catheter tubing was secured at the right thigh and the lytic therapy was initiated. The patient tolerated the procedure well without immediate postprocedural complication. FINDINGS: Central pulmonary arteriogram demonstrates significant embolic burden  at the BILATERAL pulmonary arterial bifurcations. Acquired pressure measurements: Main pulmonary artery 32/17; mean 23 (normal: < 25/10) Following the procedure, both infusion catheter tips terminate within the distal aspects of the bilateral lower lobe sub segmental pulmonary arteries. IMPRESSION: 1. Successful initiation of bilateral catheter directed pulmonary arterial lysis for submassive pulmonary embolism. 2. Significant embolic burden at the BILATERAL pulmonary arterial bifurcations 3. Elevated pressure measurements within the main pulmonary artery, compatible with pulmonary arterial hypertension. PLAN: Lytic catheter infusion rates as described per order set. Vascular Interventional Radiology will follow the patient with anticipated catheter removal during A.M. rounds. Roanna Banning, MD [facility] Electronically Signed   By: Roanna Banning M.D.   On: 12/21/2022 16:18   ECHOCARDIOGRAM COMPLETE  Result Date: 12/21/2022    ECHOCARDIOGRAM REPORT   Patient Name:   Jeffrey [name] Date of Exam: 12/21/2022 Medical Rec #:  [id number]        Height:       76.0 in Accession #:    [id number]       Weight:       235.2 lb Date of Birth:  1966-03-17        BSA:          2.373 m Patient Age:    57 years         BP:           108/89 mmHg Patient Gender: M                HR:           112 bpm. Exam Location:  Inpatient Procedure: 2D Echo, Color Doppler and Cardiac Doppler STAT ECHO Indications:    Pulmonary Embolus  History:        Patient has prior history of Echocardiogram examinations, most                 recent 09/24/2016. Pulmonary Embolus; Risk Factors:Dyslipidemia                 and hx of Polysubstance Use.  Sonographer:    Milbert Coulter Referring Phys: [id number] SUDHAM CHAND IMPRESSIONS  1. Left ventricular ejection fraction, by estimation, is 60 to 65%. The left ventricle has normal function. The left ventricle has no regional wall motion abnormalities. There  is severe eccentric left ventricular hypertrophy. Left ventricular diastolic parameters were normal. There is the interventricular septum is flattened in systole and diastole, consistent with right ventricular pressure and volume overload.  2. Right ventricular systolic function is severely reduced. The right ventricular size is severely enlarged. There is normal pulmonary artery systolic pressure.  3. Right atrial size was moderately dilated.  4. The mitral valve is normal in structure. Trivial mitral valve regurgitation. No evidence of mitral stenosis.  5. The aortic valve is tricuspid. Aortic valve regurgitation is not visualized. No aortic stenosis is present.  6. The inferior vena cava is dilated in size with <50% respiratory variability, suggesting right atrial pressure of 15 mmHg. Comparison(s): Changes from prior study are noted. Conclusion(s)/Recommendation(s): Severe RV  follow    Electronically Signed: Ardith Dark, NP 12/23/2022, 3:04 PM   I spent a total of 15 Minutes at the the patient's bedside AND on the patient's hospital floor or unit, greater than 50% of which was counseling/coordinating care for PE Lysis. Right groin hematoma    [name], male   DOB: September 23, 1965, 57 y.o.   MRN: [id number]  pulmonary artery. Limited contrast injection confirmed appropriate positioning. Over an exchange length stiff Amplatz, the catheter was exchanged for a 90/10 cm multi side-hole infusion catheter. A postprocedural fluoroscopic image was obtained to document final catheter positioning. The external catheter tubing was secured at the right thigh and the lytic therapy was initiated. The patient tolerated the procedure well without immediate postprocedural complication. FINDINGS: Central pulmonary arteriogram demonstrates significant embolic burden  at the BILATERAL pulmonary arterial bifurcations. Acquired pressure measurements: Main pulmonary artery 32/17; mean 23 (normal: < 25/10) Following the procedure, both infusion catheter tips terminate within the distal aspects of the bilateral lower lobe sub segmental pulmonary arteries. IMPRESSION: 1. Successful initiation of bilateral catheter directed pulmonary arterial lysis for submassive pulmonary embolism. 2. Significant embolic burden at the BILATERAL pulmonary arterial bifurcations 3. Elevated pressure measurements within the main pulmonary artery, compatible with pulmonary arterial hypertension. PLAN: Lytic catheter infusion rates as described per order set. Vascular Interventional Radiology will follow the patient with anticipated catheter removal during A.M. rounds. Roanna Banning, MD [facility] Electronically Signed   By: Roanna Banning M.D.   On: 12/21/2022 16:18   ECHOCARDIOGRAM COMPLETE  Result Date: 12/21/2022    ECHOCARDIOGRAM REPORT   Patient Name:   Jeffrey [name] Date of Exam: 12/21/2022 Medical Rec #:  [id number]        Height:       76.0 in Accession #:    [id number]       Weight:       235.2 lb Date of Birth:  1966-03-17        BSA:          2.373 m Patient Age:    57 years         BP:           108/89 mmHg Patient Gender: M                HR:           112 bpm. Exam Location:  Inpatient Procedure: 2D Echo, Color Doppler and Cardiac Doppler STAT ECHO Indications:    Pulmonary Embolus  History:        Patient has prior history of Echocardiogram examinations, most                 recent 09/24/2016. Pulmonary Embolus; Risk Factors:Dyslipidemia                 and hx of Polysubstance Use.  Sonographer:    Milbert Coulter Referring Phys: [id number] SUDHAM CHAND IMPRESSIONS  1. Left ventricular ejection fraction, by estimation, is 60 to 65%. The left ventricle has normal function. The left ventricle has no regional wall motion abnormalities. There  is severe eccentric left ventricular hypertrophy. Left ventricular diastolic parameters were normal. There is the interventricular septum is flattened in systole and diastole, consistent with right ventricular pressure and volume overload.  2. Right ventricular systolic function is severely reduced. The right ventricular size is severely enlarged. There is normal pulmonary artery systolic pressure.  3. Right atrial size was moderately dilated.  4. The mitral valve is normal in structure. Trivial mitral valve regurgitation. No evidence of mitral stenosis.  5. The aortic valve is tricuspid. Aortic valve regurgitation is not visualized. No aortic stenosis is present.  6. The inferior vena cava is dilated in size with <50% respiratory variability, suggesting right atrial pressure of 15 mmHg. Comparison(s): Changes from prior study are noted. Conclusion(s)/Recommendation(s): Severe RV  pulmonary artery. Limited contrast injection confirmed appropriate positioning. Over an exchange length stiff Amplatz, the catheter was exchanged for a 90/10 cm multi side-hole infusion catheter. A postprocedural fluoroscopic image was obtained to document final catheter positioning. The external catheter tubing was secured at the right thigh and the lytic therapy was initiated. The patient tolerated the procedure well without immediate postprocedural complication. FINDINGS: Central pulmonary arteriogram demonstrates significant embolic burden  at the BILATERAL pulmonary arterial bifurcations. Acquired pressure measurements: Main pulmonary artery 32/17; mean 23 (normal: < 25/10) Following the procedure, both infusion catheter tips terminate within the distal aspects of the bilateral lower lobe sub segmental pulmonary arteries. IMPRESSION: 1. Successful initiation of bilateral catheter directed pulmonary arterial lysis for submassive pulmonary embolism. 2. Significant embolic burden at the BILATERAL pulmonary arterial bifurcations 3. Elevated pressure measurements within the main pulmonary artery, compatible with pulmonary arterial hypertension. PLAN: Lytic catheter infusion rates as described per order set. Vascular Interventional Radiology will follow the patient with anticipated catheter removal during A.M. rounds. Roanna Banning, MD [facility] Electronically Signed   By: Roanna Banning M.D.   On: 12/21/2022 16:18   ECHOCARDIOGRAM COMPLETE  Result Date: 12/21/2022    ECHOCARDIOGRAM REPORT   Patient Name:   Jeffrey [name] Date of Exam: 12/21/2022 Medical Rec #:  [id number]        Height:       76.0 in Accession #:    [id number]       Weight:       235.2 lb Date of Birth:  1966-03-17        BSA:          2.373 m Patient Age:    57 years         BP:           108/89 mmHg Patient Gender: M                HR:           112 bpm. Exam Location:  Inpatient Procedure: 2D Echo, Color Doppler and Cardiac Doppler STAT ECHO Indications:    Pulmonary Embolus  History:        Patient has prior history of Echocardiogram examinations, most                 recent 09/24/2016. Pulmonary Embolus; Risk Factors:Dyslipidemia                 and hx of Polysubstance Use.  Sonographer:    Milbert Coulter Referring Phys: [id number] SUDHAM CHAND IMPRESSIONS  1. Left ventricular ejection fraction, by estimation, is 60 to 65%. The left ventricle has normal function. The left ventricle has no regional wall motion abnormalities. There  is severe eccentric left ventricular hypertrophy. Left ventricular diastolic parameters were normal. There is the interventricular septum is flattened in systole and diastole, consistent with right ventricular pressure and volume overload.  2. Right ventricular systolic function is severely reduced. The right ventricular size is severely enlarged. There is normal pulmonary artery systolic pressure.  3. Right atrial size was moderately dilated.  4. The mitral valve is normal in structure. Trivial mitral valve regurgitation. No evidence of mitral stenosis.  5. The aortic valve is tricuspid. Aortic valve regurgitation is not visualized. No aortic stenosis is present.  6. The inferior vena cava is dilated in size with <50% respiratory variability, suggesting right atrial pressure of 15 mmHg. Comparison(s): Changes from prior study are noted. Conclusion(s)/Recommendation(s): Severe RV  pulmonary artery. Limited contrast injection confirmed appropriate positioning. Over an exchange length stiff Amplatz, the catheter was exchanged for a 90/10 cm multi side-hole infusion catheter. A postprocedural fluoroscopic image was obtained to document final catheter positioning. The external catheter tubing was secured at the right thigh and the lytic therapy was initiated. The patient tolerated the procedure well without immediate postprocedural complication. FINDINGS: Central pulmonary arteriogram demonstrates significant embolic burden  at the BILATERAL pulmonary arterial bifurcations. Acquired pressure measurements: Main pulmonary artery 32/17; mean 23 (normal: < 25/10) Following the procedure, both infusion catheter tips terminate within the distal aspects of the bilateral lower lobe sub segmental pulmonary arteries. IMPRESSION: 1. Successful initiation of bilateral catheter directed pulmonary arterial lysis for submassive pulmonary embolism. 2. Significant embolic burden at the BILATERAL pulmonary arterial bifurcations 3. Elevated pressure measurements within the main pulmonary artery, compatible with pulmonary arterial hypertension. PLAN: Lytic catheter infusion rates as described per order set. Vascular Interventional Radiology will follow the patient with anticipated catheter removal during A.M. rounds. Roanna Banning, MD [facility] Electronically Signed   By: Roanna Banning M.D.   On: 12/21/2022 16:18   ECHOCARDIOGRAM COMPLETE  Result Date: 12/21/2022    ECHOCARDIOGRAM REPORT   Patient Name:   Jeffrey [name] Date of Exam: 12/21/2022 Medical Rec #:  [id number]        Height:       76.0 in Accession #:    [id number]       Weight:       235.2 lb Date of Birth:  1966-03-17        BSA:          2.373 m Patient Age:    57 years         BP:           108/89 mmHg Patient Gender: M                HR:           112 bpm. Exam Location:  Inpatient Procedure: 2D Echo, Color Doppler and Cardiac Doppler STAT ECHO Indications:    Pulmonary Embolus  History:        Patient has prior history of Echocardiogram examinations, most                 recent 09/24/2016. Pulmonary Embolus; Risk Factors:Dyslipidemia                 and hx of Polysubstance Use.  Sonographer:    Milbert Coulter Referring Phys: [id number] SUDHAM CHAND IMPRESSIONS  1. Left ventricular ejection fraction, by estimation, is 60 to 65%. The left ventricle has normal function. The left ventricle has no regional wall motion abnormalities. There  is severe eccentric left ventricular hypertrophy. Left ventricular diastolic parameters were normal. There is the interventricular septum is flattened in systole and diastole, consistent with right ventricular pressure and volume overload.  2. Right ventricular systolic function is severely reduced. The right ventricular size is severely enlarged. There is normal pulmonary artery systolic pressure.  3. Right atrial size was moderately dilated.  4. The mitral valve is normal in structure. Trivial mitral valve regurgitation. No evidence of mitral stenosis.  5. The aortic valve is tricuspid. Aortic valve regurgitation is not visualized. No aortic stenosis is present.  6. The inferior vena cava is dilated in size with <50% respiratory variability, suggesting right atrial pressure of 15 mmHg. Comparison(s): Changes from prior study are noted. Conclusion(s)/Recommendation(s): Severe RV  follow    Electronically Signed: Ardith Dark, NP 12/23/2022, 3:04 PM   I spent a total of 15 Minutes at the the patient's bedside AND on the patient's hospital floor or unit, greater than 50% of which was counseling/coordinating care for PE Lysis. Right groin hematoma    [name], male   DOB: September 23, 1965, 57 y.o.   MRN: [id number]  pulmonary artery. Limited contrast injection confirmed appropriate positioning. Over an exchange length stiff Amplatz, the catheter was exchanged for a 90/10 cm multi side-hole infusion catheter. A postprocedural fluoroscopic image was obtained to document final catheter positioning. The external catheter tubing was secured at the right thigh and the lytic therapy was initiated. The patient tolerated the procedure well without immediate postprocedural complication. FINDINGS: Central pulmonary arteriogram demonstrates significant embolic burden  at the BILATERAL pulmonary arterial bifurcations. Acquired pressure measurements: Main pulmonary artery 32/17; mean 23 (normal: < 25/10) Following the procedure, both infusion catheter tips terminate within the distal aspects of the bilateral lower lobe sub segmental pulmonary arteries. IMPRESSION: 1. Successful initiation of bilateral catheter directed pulmonary arterial lysis for submassive pulmonary embolism. 2. Significant embolic burden at the BILATERAL pulmonary arterial bifurcations 3. Elevated pressure measurements within the main pulmonary artery, compatible with pulmonary arterial hypertension. PLAN: Lytic catheter infusion rates as described per order set. Vascular Interventional Radiology will follow the patient with anticipated catheter removal during A.M. rounds. Roanna Banning, MD [facility] Electronically Signed   By: Roanna Banning M.D.   On: 12/21/2022 16:18   ECHOCARDIOGRAM COMPLETE  Result Date: 12/21/2022    ECHOCARDIOGRAM REPORT   Patient Name:   Jeffrey [name] Date of Exam: 12/21/2022 Medical Rec #:  [id number]        Height:       76.0 in Accession #:    [id number]       Weight:       235.2 lb Date of Birth:  1966-03-17        BSA:          2.373 m Patient Age:    57 years         BP:           108/89 mmHg Patient Gender: M                HR:           112 bpm. Exam Location:  Inpatient Procedure: 2D Echo, Color Doppler and Cardiac Doppler STAT ECHO Indications:    Pulmonary Embolus  History:        Patient has prior history of Echocardiogram examinations, most                 recent 09/24/2016. Pulmonary Embolus; Risk Factors:Dyslipidemia                 and hx of Polysubstance Use.  Sonographer:    Milbert Coulter Referring Phys: [id number] SUDHAM CHAND IMPRESSIONS  1. Left ventricular ejection fraction, by estimation, is 60 to 65%. The left ventricle has normal function. The left ventricle has no regional wall motion abnormalities. There  is severe eccentric left ventricular hypertrophy. Left ventricular diastolic parameters were normal. There is the interventricular septum is flattened in systole and diastole, consistent with right ventricular pressure and volume overload.  2. Right ventricular systolic function is severely reduced. The right ventricular size is severely enlarged. There is normal pulmonary artery systolic pressure.  3. Right atrial size was moderately dilated.  4. The mitral valve is normal in structure. Trivial mitral valve regurgitation. No evidence of mitral stenosis.  5. The aortic valve is tricuspid. Aortic valve regurgitation is not visualized. No aortic stenosis is present.  6. The inferior vena cava is dilated in size with <50% respiratory variability, suggesting right atrial pressure of 15 mmHg. Comparison(s): Changes from prior study are noted. Conclusion(s)/Recommendation(s): Severe RV  follow    Electronically Signed: Ardith Dark, NP 12/23/2022, 3:04 PM   I spent a total of 15 Minutes at the the patient's bedside AND on the patient's hospital floor or unit, greater than 50% of which was counseling/coordinating care for PE Lysis. Right groin hematoma    [name], male   DOB: September 23, 1965, 57 y.o.   MRN: [id number]  pulmonary artery. Limited contrast injection confirmed appropriate positioning. Over an exchange length stiff Amplatz, the catheter was exchanged for a 90/10 cm multi side-hole infusion catheter. A postprocedural fluoroscopic image was obtained to document final catheter positioning. The external catheter tubing was secured at the right thigh and the lytic therapy was initiated. The patient tolerated the procedure well without immediate postprocedural complication. FINDINGS: Central pulmonary arteriogram demonstrates significant embolic burden  at the BILATERAL pulmonary arterial bifurcations. Acquired pressure measurements: Main pulmonary artery 32/17; mean 23 (normal: < 25/10) Following the procedure, both infusion catheter tips terminate within the distal aspects of the bilateral lower lobe sub segmental pulmonary arteries. IMPRESSION: 1. Successful initiation of bilateral catheter directed pulmonary arterial lysis for submassive pulmonary embolism. 2. Significant embolic burden at the BILATERAL pulmonary arterial bifurcations 3. Elevated pressure measurements within the main pulmonary artery, compatible with pulmonary arterial hypertension. PLAN: Lytic catheter infusion rates as described per order set. Vascular Interventional Radiology will follow the patient with anticipated catheter removal during A.M. rounds. Roanna Banning, MD [facility] Electronically Signed   By: Roanna Banning M.D.   On: 12/21/2022 16:18   ECHOCARDIOGRAM COMPLETE  Result Date: 12/21/2022    ECHOCARDIOGRAM REPORT   Patient Name:   Jeffrey [name] Date of Exam: 12/21/2022 Medical Rec #:  [id number]        Height:       76.0 in Accession #:    [id number]       Weight:       235.2 lb Date of Birth:  1966-03-17        BSA:          2.373 m Patient Age:    57 years         BP:           108/89 mmHg Patient Gender: M                HR:           112 bpm. Exam Location:  Inpatient Procedure: 2D Echo, Color Doppler and Cardiac Doppler STAT ECHO Indications:    Pulmonary Embolus  History:        Patient has prior history of Echocardiogram examinations, most                 recent 09/24/2016. Pulmonary Embolus; Risk Factors:Dyslipidemia                 and hx of Polysubstance Use.  Sonographer:    Milbert Coulter Referring Phys: [id number] SUDHAM CHAND IMPRESSIONS  1. Left ventricular ejection fraction, by estimation, is 60 to 65%. The left ventricle has normal function. The left ventricle has no regional wall motion abnormalities. There  is severe eccentric left ventricular hypertrophy. Left ventricular diastolic parameters were normal. There is the interventricular septum is flattened in systole and diastole, consistent with right ventricular pressure and volume overload.  2. Right ventricular systolic function is severely reduced. The right ventricular size is severely enlarged. There is normal pulmonary artery systolic pressure.  3. Right atrial size was moderately dilated.  4. The mitral valve is normal in structure. Trivial mitral valve regurgitation. No evidence of mitral stenosis.  5. The aortic valve is tricuspid. Aortic valve regurgitation is not visualized. No aortic stenosis is present.  6. The inferior vena cava is dilated in size with <50% respiratory variability, suggesting right atrial pressure of 15 mmHg. Comparison(s): Changes from prior study are noted. Conclusion(s)/Recommendation(s): Severe RV  pulmonary artery. Limited contrast injection confirmed appropriate positioning. Over an exchange length stiff Amplatz, the catheter was exchanged for a 90/10 cm multi side-hole infusion catheter. A postprocedural fluoroscopic image was obtained to document final catheter positioning. The external catheter tubing was secured at the right thigh and the lytic therapy was initiated. The patient tolerated the procedure well without immediate postprocedural complication. FINDINGS: Central pulmonary arteriogram demonstrates significant embolic burden  at the BILATERAL pulmonary arterial bifurcations. Acquired pressure measurements: Main pulmonary artery 32/17; mean 23 (normal: < 25/10) Following the procedure, both infusion catheter tips terminate within the distal aspects of the bilateral lower lobe sub segmental pulmonary arteries. IMPRESSION: 1. Successful initiation of bilateral catheter directed pulmonary arterial lysis for submassive pulmonary embolism. 2. Significant embolic burden at the BILATERAL pulmonary arterial bifurcations 3. Elevated pressure measurements within the main pulmonary artery, compatible with pulmonary arterial hypertension. PLAN: Lytic catheter infusion rates as described per order set. Vascular Interventional Radiology will follow the patient with anticipated catheter removal during A.M. rounds. Roanna Banning, MD [facility] Electronically Signed   By: Roanna Banning M.D.   On: 12/21/2022 16:18   ECHOCARDIOGRAM COMPLETE  Result Date: 12/21/2022    ECHOCARDIOGRAM REPORT   Patient Name:   Jeffrey [name] Date of Exam: 12/21/2022 Medical Rec #:  [id number]        Height:       76.0 in Accession #:    [id number]       Weight:       235.2 lb Date of Birth:  1966-03-17        BSA:          2.373 m Patient Age:    57 years         BP:           108/89 mmHg Patient Gender: M                HR:           112 bpm. Exam Location:  Inpatient Procedure: 2D Echo, Color Doppler and Cardiac Doppler STAT ECHO Indications:    Pulmonary Embolus  History:        Patient has prior history of Echocardiogram examinations, most                 recent 09/24/2016. Pulmonary Embolus; Risk Factors:Dyslipidemia                 and hx of Polysubstance Use.  Sonographer:    Milbert Coulter Referring Phys: [id number] SUDHAM CHAND IMPRESSIONS  1. Left ventricular ejection fraction, by estimation, is 60 to 65%. The left ventricle has normal function. The left ventricle has no regional wall motion abnormalities. There  is severe eccentric left ventricular hypertrophy. Left ventricular diastolic parameters were normal. There is the interventricular septum is flattened in systole and diastole, consistent with right ventricular pressure and volume overload.  2. Right ventricular systolic function is severely reduced. The right ventricular size is severely enlarged. There is normal pulmonary artery systolic pressure.  3. Right atrial size was moderately dilated.  4. The mitral valve is normal in structure. Trivial mitral valve regurgitation. No evidence of mitral stenosis.  5. The aortic valve is tricuspid. Aortic valve regurgitation is not visualized. No aortic stenosis is present.  6. The inferior vena cava is dilated in size with <50% respiratory variability, suggesting right atrial pressure of 15 mmHg. Comparison(s): Changes from prior study are noted. Conclusion(s)/Recommendation(s): Severe RV  follow    Electronically Signed: Ardith Dark, NP 12/23/2022, 3:04 PM   I spent a total of 15 Minutes at the the patient's bedside AND on the patient's hospital floor or unit, greater than 50% of which was counseling/coordinating care for PE Lysis. Right groin hematoma    [name], male   DOB: September 23, 1965, 57 y.o.   MRN: [id number]

## 2022-12-23 NOTE — Assessment & Plan Note (Signed)
Patient with mildly elevated transaminases. ALT 56, AST 53. No abdominal symptoms at this time other than constipation. - AM CMP - CTM

## 2022-12-23 NOTE — TOC Benefit Eligibility Note (Addendum)
Patient Product/process development scientist completed.    The patient is insured through [facility]. Patient has [employer], may use a copay card, and/or apply for patient assistance if available.    Ran test claim for Spiriva Inh and the current 30 day co-pay is $5.00.  Ran test claim for Incruse Ellipta 62.5 mcg/act and Requires Prior Authorization  This test claim was processed through [facility]- copay amounts may vary at other pharmacies due to [employer], or as the patient moves through the different stages of their insurance plan.     Roland Earl, CPHT Pharmacy Technician III Certified Patient Advocate [facility] Patient Advocate Team Direct Number: [phone]  Fax: [phone]

## 2022-12-23 NOTE — Telephone Encounter (Signed)
Called PT and left msg we need to do a HFU for this PT;first avail.

## 2022-12-23 NOTE — Progress Notes (Signed)
ANTICOAGULATION CONSULT NOTE ` Pharmacy Consult for IV heparin  Indication: pulmonary embolus  No Known Allergies  Patient Measurements: Height: 6\' 4"  (193 cm) Weight: 105.8 kg (233 lb 4 oz) IBW/kg (Calculated) : 86.8 Heparin Dosing Weight: 106.7 kg  Vital Signs: Temp: 99.1 F (37.3 C) (09/25 0736) Temp Source: Oral (09/25 0736) BP: 132/79 (09/25 0736) Pulse Rate: 82 (09/25 0736)  Labs: Recent Labs    12/21/22 0557 12/21/22 0603 12/21/22 9604 12/21/22 0813 12/21/22 1351 12/21/22 2207 12/22/22 0024 12/22/22 0654 12/22/22 0655 12/22/22 0943 12/22/22 1642 12/23/22 0808  HGB 15.3 16.7  --   --  14.0   < >  --  11.7*  --  11.5* 11.1*  --   HCT 45.8 49.0  --   --  40.4   < >  --  34.7*  --  33.5* 32.2*  --   PLT 120*  --   --   --  110*   < >  --  108*  --  114* 112*  --   APTT  --   --    < >  --  59*  --  149* 103*  --   --   --   --   HEPARINUNFRC  --   --    < >  --  0.23*  --  0.73*  --  0.68  --   --  0.30  CREATININE 1.59* 1.50*  --   --  1.35*  --   --  0.90  --   --   --   --   TROPONINIHS 1,011*  --   --  1,057*  --   --   --   --   --   --   --   --    < > = values in this interval not displayed.    Estimated Creatinine Clearance: 118 mL/min (by C-G formula based on SCr of 0.9 mg/dL).  Assessment: 57 y.o. male with history of DVT/PE on Eliquis (last dose 9/22 0830) prior to admission who was admitted with new PE. Patient underwent catheter directed lysis on 9/23.   Heparin level is at lower end of therapeutic range (0.30) on 1600 units/hr. CBC stable and WNL. No issues with infusion or s/sx of bleeding per RN.   Goal of Therapy:  Heparin level 0.3-0.7 units/ml Monitor platelets by anticoagulation protocol: Yes   Plan:  Increase heparin to 1650 units/hr Daily heparin level while on heparin Monitor daily CBC and s/sx of bleeding F/u plans to transition to DOAC  Nicole Kindred, PharmD PGY1 Pharmacy Resident 12/23/2022 9:27 AM

## 2022-12-23 NOTE — Assessment & Plan Note (Addendum)
Patient presented with submassive PE with right heart strain s/p targeted thrombolysis with symptomatic improvement. BNP this am within normal limits. Patient on heparin drip and satting well on room air. IR following. - Admit to FMTS morning of 9/25 - Follow-up IR recs - Continue heparin drip - Consider starting Eliquis DOAC tomorrow in the setting of thigh hematoma - Albuterol nebs q4 prn. O2 as needed.  - PT/OT eval and treat - Outpatient pulm fu

## 2022-12-23 NOTE — Assessment & Plan Note (Signed)
Patient notes he has not had a bowel movement since coming to the hospital. - Miralax and senna bowel regimen - CTM

## 2022-12-24 ENCOUNTER — Other Ambulatory Visit (HOSPITAL_COMMUNITY): Payer: Self-pay

## 2022-12-24 DIAGNOSIS — J9601 Acute respiratory failure with hypoxia: Secondary | ICD-10-CM | POA: Diagnosis not present

## 2022-12-24 DIAGNOSIS — I2699 Other pulmonary embolism without acute cor pulmonale: Secondary | ICD-10-CM | POA: Diagnosis not present

## 2022-12-24 DIAGNOSIS — R748 Abnormal levels of other serum enzymes: Secondary | ICD-10-CM | POA: Diagnosis not present

## 2022-12-24 LAB — CBC
HCT: 28.2 % — ABNORMAL LOW (ref 39.0–52.0)
HCT: 27.7 % — ABNORMAL LOW (ref 39.0–52.0)
Hemoglobin: 9.6 g/dL — ABNORMAL LOW (ref 13.0–17.0)
Hemoglobin: 9.2 g/dL — ABNORMAL LOW (ref 13.0–17.0)
MCH: 33.1 pg (ref 26.0–34.0)
MCH: 31.9 pg (ref 26.0–34.0)
MCHC: 34 g/dL (ref 30.0–36.0)
MCHC: 33.2 g/dL (ref 30.0–36.0)
MCV: 97.2 fL (ref 80.0–100.0)
MCV: 96.2 fL (ref 80.0–100.0)
Platelets: 113 10*3/uL — ABNORMAL LOW (ref 150–400)
Platelets: 117 10*3/uL — ABNORMAL LOW (ref 150–400)
RBC: 2.9 MIL/uL — ABNORMAL LOW (ref 4.22–5.81)
RBC: 2.88 MIL/uL — ABNORMAL LOW (ref 4.22–5.81)
RDW: 14.7 % (ref 11.5–15.5)
RDW: 14.8 % (ref 11.5–15.5)
WBC: 8 10*3/uL (ref 4.0–10.5)
WBC: 6.7 10*3/uL (ref 4.0–10.5)
nRBC: 0 % (ref 0.0–0.2)
nRBC: 0 % (ref 0.0–0.2)

## 2022-12-24 LAB — COMPREHENSIVE METABOLIC PANEL
ALT: 62 U/L — ABNORMAL HIGH (ref 0–44)
AST: 58 U/L — ABNORMAL HIGH (ref 15–41)
Albumin: 2.8 g/dL — ABNORMAL LOW (ref 3.5–5.0)
Alkaline Phosphatase: 59 U/L (ref 38–126)
Anion gap: 5 (ref 5–15)
BUN: 8 mg/dL (ref 6–20)
CO2: 24 mmol/L (ref 22–32)
Calcium: 8.3 mg/dL — ABNORMAL LOW (ref 8.9–10.3)
Chloride: 102 mmol/L (ref 98–111)
Creatinine, Ser: 0.93 mg/dL (ref 0.61–1.24)
GFR, Estimated: >60 mL/min (ref >60)
Glucose, Bld: 97 mg/dL (ref 70–99)
Potassium: 3.6 mmol/L (ref 3.5–5.1)
Sodium: 131 mmol/L — ABNORMAL LOW (ref 135–145)
Total Bilirubin: 0.6 mg/dL (ref 0.3–1.2)
Total Protein: 5.5 g/dL — ABNORMAL LOW (ref 6.5–8.1)

## 2022-12-24 LAB — LACTIC ACID, PLASMA: Lactic Acid, Venous: 1.1 mmol/L (ref 0.5–1.9)

## 2022-12-24 LAB — HEPARIN LEVEL (UNFRACTIONATED): Heparin Unfractionated: 0.28 IU/mL — ABNORMAL LOW (ref 0.30–0.70)

## 2022-12-24 MED ORDER — SPIRIVA RESPIMAT 1.25 MCG/ACT IN AERS
2.0000 | INHALATION_SPRAY | Freq: Every day | RESPIRATORY_TRACT | 0 refills | Status: AC
  Filled 2022-12-24: qty 4, 30d supply, fill #0

## 2022-12-24 MED ORDER — APIXABAN 5 MG PO TABS
ORAL_TABLET | ORAL | 0 refills | Status: DC
Start: 2022-12-24 — End: 2023-01-19
  Filled 2022-12-24: qty 60, 48d supply, fill #0

## 2022-12-24 MED ORDER — APIXABAN 5 MG PO TABS
10.0000 mg | ORAL_TABLET | Freq: Two times a day (BID) | ORAL | Status: DC
  Administered 2022-12-24 – 2022-12-25 (×3): 10 mg via ORAL
  Filled 2022-12-24 (×3): qty 2

## 2022-12-24 NOTE — Discharge Summary (Addendum)
Family Medicine Teaching [facility] Discharge Summary  [name] Medical record number: [id number] Date of birth: 1966-03-12 Age: 57 y.o. Gender: male Date of Admission: 12/21/2022  Date of Discharge: 12/24/2022 Admitting Physician: Jeffrey Fowler, MD  Primary Care Provider: Bess Kinds, MD Consultants: CCM, Interventional Radiology   Indication for Hospitalization: Submassive PE with R heart strain   Discharge Diagnoses/Problem List:  Principal Problem for Admission: Submassive PE with R heart strain Other Problems addressed during stay:  Principal Problem:   PE (pulmonary thromboembolism) (HCC) Active Problems:   Acute respiratory failure with hypoxia (HCC)   Constipation   Lactic acidosis   Elevated liver enzymes   Hematoma   Anemia  Brief Hospital Course:  Jeffrey [name] is a 57yo M w/ hx of prior PE anticoagulated with Eliquis, DVT, HTN, HLD admitted for submassive pulmonary embolism w/ acute cor pulmonale s/p PAT. Hospital course is as follows:   Overview and Major Events: 9/23: Admitted to ICU for submassive PE with right heart strain  9/23: Pulmonary arteriography thrombolysis, catheter directed 9/24: Stabilized 9/25: Transferred to FMTS 9/26: Transition from heparin gtt to DOAC  Submassive PE  Acute Cor Pulmonale Patient presented w/ 2 day hx of chest pain and SOB in setting of taking eliquis not as directed (patient directed to take Eliquis 5 BID, was taking both doses at once). CTA showed acute PE, and CT showed evidence of R heart strain. Echo showed severely reduced R ventricular systolic function and severe R ventricular enlargement, R atrial moderately dilated. Pt was admitted to ICU where IR performed catheter-directed thrombolysis w/ tPA. Pt also had elevated Troponins, including a peak of 1011. BNP initially elevated at 110.3, improved to 59.5 following thrombolysis. Patient initially on heparin gtt, successfully transitioned to Eliquis before  discharge. Patient improved symptomatically, able to ambulate independently including stairs at time of discharge on room air.  R Groin Hematoma at cath site Patient had uncomplicated hematoma at catheter puncture site. Healed well throughout admission without pain, induration, bleeding, or drainage.   Anemia On arrival patient's hemoglobin 15.3.  Throughout admission hemoglobin gradually decreased to 9.2 at the lowest, likely secondary to bleeding to the hematoma areas as above vs occult GI bleed.  No other active signs of bleeding. On day of discharge patient's hemoglobin up to 9.6.    COPD (presumed, no PFTs) Started Incruse Ellipta while inpatient. Spiriva at discharge preferred by insurance.   AKI Cr on admission 1.59 (baseline <1). Thought to be pre-renal in setting of PE. Treated w/ IVF. Cr improved to baseline by time of discharge.   PCP Recs: 1) Consider Echo 2) Consider PFTs for suspected COPD 3) CBC, BMP at follow up  4) Needs colonoscopy 5) Consider PSA 6) Patient interested in possible smoking cessation   Disposition: Home   Discharge Condition: Improved and stable  Discharge Exam:  Vitals:   12/25/22 0830 12/25/22 1048  BP:  (!) 139/90  Pulse: 78 72  Resp: 16   Temp:  (!) 96.5 F (35.8 C)  SpO2: 96% 100%   General: Well-appearing, no acute distress.  Sleeping. Cardiovascular: Normal S1/S2.  No extra heart sounds.  Warm and well-perfused. Respiratory: Breathing comfortably on room air.  Some crackles in right lower lobe, improved aeration throughout.  No increased work of breathing.  Abdomen: Soft, nontender, nondistended. Extremities: Healing hematoma at R groin catheter site without erythema, induration, or drainage. Healing hematoma of L antecubital fossa. Extremities warm, dry, nonedematous.  Significant Procedures:  9/23: Pulmonary  arteriography thrombolysis, catheter directed  Significant Labs and Imaging:  Recent Labs  Lab 12/24/22 0722  12/24/22 1539 12/25/22 0720  WBC 8.0 6.7 6.3  HGB 9.6* 9.2* 9.6*  HCT 28.2* 27.7* 27.8*  PLT 113* 117* 133*   Recent Labs  Lab 12/24/22 0722 12/25/22 0720  NA 131* 133*  K 3.6 3.8  CL 102 104  CO2 24 23  GLUCOSE 97 114*  BUN 8 9  CREATININE 0.93 0.88  CALCIUM 8.3* 8.4*  ALKPHOS 59 56  AST 58* 72*  ALT 62* 74*  ALBUMIN 2.8* 2.8*   CTPE: acute PE with CT evidence of right heart strain, at least submassive  Results/Tests Pending at Time of Discharge: n/a  Discharge Medications:  Allergies as of 12/25/2022   No Known Allergies      Medication List     TAKE these medications    amLODipine 5 MG tablet Commonly known as: NORVASC Take 1 tablet (5 mg total) by mouth at bedtime.   atorvastatin 40 MG tablet Commonly known as: LIPITOR TAKE 1 TABLET BY MOUTH ONCE DAILY   DULoxetine 30 MG capsule Commonly known as: Cymbalta Take 1 capsule (30 mg total) by mouth daily.   Eliquis 5 MG Tabs tablet Generic drug: apixaban Take 2 tablets twice a day for 4 more days, then 1 tablets twice a day afterwards What changed:  how much to take how to take this when to take this additional instructions   FeroSul 325 (65 Fe) MG tablet Generic drug: ferrous sulfate Take 1 tablet (325 mg total) by mouth every other day for 60 days then as directed by MD   hydrochlorothiazide 25 MG tablet Commonly known as: HYDRODIURIL Take 1 tablet (25 mg total) by mouth daily.   NON FORMULARY Pt uses a cpap nighlty   olmesartan 40 MG tablet Commonly known as: BENICAR Take 1 tablet (40 mg total) by mouth at bedtime.   Spiriva Respimat 1.25 MCG/ACT Aers Generic drug: Tiotropium Bromide Monohydrate Inhale 2 puffs into the lungs daily.        Discharge Instructions: Please refer to Patient Instructions section of EMR for full details.  Patient was counseled important signs and symptoms that should prompt return to medical care, changes in medications, dietary instructions, activity  restrictions, and follow up appointments.   Follow-Up Appointments:  Follow-up Information     Jeffrey Quale, MD Follow up on 01/01/2023.   Specialty: Family Medicine Why: 11:00 Contact information: [address] [place] [address] [phone]                 Jeffrey Quale, MD 12/25/2022, 1:05 PM PGY-1, [facility]   Upper Level Addendum:  I have seen and evaluated this patient along with Dr. Threasa Beards and reviewed the above note, making necessary revisions as appropriate.  I agree with the medical decision making and physical exam as noted above.  Levin Erp, MD PGY-3 [facility] Family Medicine Residency

## 2022-12-24 NOTE — Discharge Instructions (Addendum)
Dear Jeffrey [name],   Thank you so much for allowing [place] to be part of your care!  You were admitted to [facility] for a pulmonary embolism (clot in your lungs) causing strain on your heart.  You underwent a procedure to remove this clot and were given anticoagulation medications while you were admitted. Your right hip bruise from the procedure appeared to heal well over time. We restarted Eliquis while you were here in the hospital.    POST-HOSPITAL & CARE INSTRUCTIONS Please continue Eliquis 10 mg twice a day for 4 days total.  After that please continue Eliquis 5 mg twice a day indefinitely.  It is very important to take this medication in the morning and again at night.  Please do not combine the doses. Please see below for further information on Eliquis.  Please let PCP/Specialists know of any changes that were made.  Please see medications section of this packet for any medication changes.   DOCTOR'S APPOINTMENT & FOLLOW UP CARE INSTRUCTIONS  Future Appointments  Date Time Provider Department Center  01/01/2023 11:00 AM Ivery Quale, MD [facility]  01/12/2023  9:30 AM Kalman Shan, MD [facility] None    RETURN PRECAUTIONS: - Chest pain - Difficulty breathing - Bleeding symptoms - Fever  Take care and be well!  Family Medicine Teaching Service  [place]  [facility]  [address] [place], [place] [address] [phone]   Information on my medicine - ELIQUIS (apixaban)  This medication education was reviewed with me or my healthcare representative as part of my discharge preparation.  Why was Eliquis prescribed for you? Eliquis was prescribed to treat blood clots that may have been found in the veins of your legs (deep vein thrombosis) or in your lungs (pulmonary embolism) and to reduce the risk of them occurring again.  What do You need to know about Eliquis ? The starting dose is 10 mg (two 5 mg tablets) taken TWICE daily for  the FIRST FOUR (4) DAYS, then on December 29, 2022 the dose is reduced to ONE 5 mg tablet taken TWICE daily.  Eliquis may be taken with or without food.   Try to take the dose about the same time in the morning and in the evening. If you have difficulty swallowing the tablet whole please discuss with your pharmacist how to take the medication safely.  Take Eliquis exactly as prescribed and DO NOT stop taking Eliquis without talking to the doctor who prescribed the medication.  Stopping may increase your risk of developing a new blood clot.  Refill your prescription before you run out.  After discharge, you should have regular check-up appointments with your healthcare provider that is prescribing your Eliquis.    What do you do if you miss a dose? If a dose of ELIQUIS is not taken at the scheduled time, take it as soon as possible on the same day and twice-daily administration should be resumed. The dose should not be doubled to make up for a missed dose.  Important Safety Information A possible side effect of Eliquis is bleeding. You should call your healthcare provider right away if you experience any of the following: Bleeding from an injury or your nose that does not stop. Unusual colored urine (red or dark brown) or unusual colored stools (red or black). Unusual bruising for unknown reasons. A serious fall or if you hit your head (even if there is no bleeding).  Some medicines may interact  with Eliquis and might increase your risk of bleeding or clotting while on Eliquis. To help avoid this, consult your healthcare provider or pharmacist prior to using any new prescription or non-prescription medications, including herbals, vitamins, non-steroidal anti-inflammatory drugs (NSAIDs) and supplements.  This website has more information on Eliquis (apixaban): http://www.eliquis.com/eliquis/home

## 2022-12-24 NOTE — Assessment & Plan Note (Addendum)
Now resolved. Patient had normal BM without difficulty or signs of bleeding.  - Continue Miralax and senna bowel regimen - CTM

## 2022-12-24 NOTE — Progress Notes (Signed)
ANTICOAGULATION CONSULT NOTE ` Pharmacy Consult for IV heparin  Indication: pulmonary embolus  No Known Allergies  Patient Measurements: Height: 6\' 4"  (193 cm) Weight: 106.4 kg (234 lb 9.1 oz) IBW/kg (Calculated) : 86.8 Heparin Dosing Weight: 106.7 kg  Vital Signs: Temp: 98.2 F (36.8 C) (09/26 0753) Temp Source: Oral (09/26 0441) BP: 146/91 (09/26 0753) Pulse Rate: 76 (09/26 0840)  Labs: Recent Labs    12/21/22 1351 12/21/22 2207 12/22/22 0024 12/22/22 0654 12/22/22 0655 12/22/22 0943 12/22/22 1642 12/23/22 0808 12/24/22 0722  HGB 14.0   < >  --  11.7*  --  11.5* 11.1*  --  9.6*  HCT 40.4   < >  --  34.7*  --  33.5* 32.2*  --  28.2*  PLT 110*   < >  --  108*  --  114* 112*  --  113*  APTT 59*  --  149* 103*  --   --   --   --   --   HEPARINUNFRC 0.23*  --  0.73*  --  0.68  --   --  0.30 0.28*  CREATININE 1.35*  --   --  0.90  --   --   --  0.90 0.93   < > = values in this interval not displayed.    Estimated Creatinine Clearance: 114.4 mL/min (by C-G formula based on SCr of 0.93 mg/dL).  Assessment: 57 y.o. male with history of DVT/PE on Eliquis (last dose 9/22 0830) prior to admission who was admitted with new PE. Patient underwent catheter directed lysis on 9/23.   Heparin level is subtherapeutic (0.28) on 1650 units/hr, trending down. Hgb dropped from 11.1 to 9.6. No issues with infusion or s/sx of bleeding per RN. R groin hematoma is resolving.   Goal of Therapy:  Heparin level 0.3-0.7 units/ml Monitor platelets by anticoagulation protocol: Yes   Plan:  Increase heparin to 1750 units/hr Daily heparin level while on heparin Monitor daily CBC and s/sx of bleeding F/u plans to transition to DOAC  Nicole Kindred, PharmD PGY1 Pharmacy Resident 12/24/2022 10:15 AM

## 2022-12-24 NOTE — Progress Notes (Signed)
FMTS Brief Progress Note  S: Went bedside with Dr. Dewayne Hatch to visit patient.  Patient resting comfortably in bed.  Patient conversational, and in good spirits.  Patient reports history of how symptoms presented for PE, and concerns and fears he had during episode.   O: BP (!) 135/96 (BP Location: Right Arm)   Pulse 76   Temp 98.6 F (37 C) (Oral)   Resp 18   Ht 6\' 4"  (1.93 m)   Wt 105.8 kg   SpO2 100%   BMI 28.39 kg/m   General: NAD, polite, conversant, good mood Resp: good WOB, CTABL, no wheezes RLE: Resolving bruise, without tenderness, or firmness, inside the lines of the marker  A/P: PE Patient admitted for PE, doing well today.  Patient is on heparin drip, status post targeted thrombolytic therapy.  Patient has bruise on right thigh that is resolving, from where the catheter site was.  Patient appreciates bruises getting better, and bruise appears to be fading.  If bruise continues to resolve, consider DOAC tomorrow. - CTM - Plans per day team - Orders reviewed. Labs for AM ordered, which was adjusted as needed.   Bess Kinds, MD 12/24/2022, 1:34 AM PGY-3, [facility] Family Medicine Night Resident  Please page [phone] with questions.

## 2022-12-24 NOTE — Telephone Encounter (Signed)
LM again. Per protocol closing the encounter.

## 2022-12-24 NOTE — Evaluation (Signed)
Occupational Therapy Evaluation Patient Details Name: Jeffrey [name] MRN: [id number] DOB: 06-May-1965 Today's Date: 12/24/2022   History of Present Illness 57 yo male admitted 9/23 with SOB and tachycardia. Pt with submassive PE, Pulmonary HTN w R heart dysfunction s/p IR thrombolysis 9/23 with Rt groin sheath removed 9/24. PMhx: PE   Clinical Impression   PTA, pt lived with wife and was mod I at home and in the community. Upon eva, pt performing ADL with mod I with use of AE after initial education and already has most AE (sock aid) at home. Reviewed how to obtain additional equipment as needed. Recommending discharge home with no OT follow up at this time.        If plan is discharge home, recommend the following: Other (comment) (on pt request)    Functional Status Assessment  Patient has had a recent decline in their functional status and demonstrates the ability to make significant improvements in function in a reasonable and predictable amount of time.  Equipment Recommendations  BSC/3in1    Recommendations for Other Services       Precautions / Restrictions Precautions Precautions: Fall Restrictions Weight Bearing Restrictions: No      Mobility Bed Mobility Overal bed mobility: Modified Independent                  Transfers Overall transfer level: Modified independent                        Balance     Sitting balance-Leahy Scale: Good     Standing balance support: Reliant on assistive device for balance, Bilateral upper extremity supported, No upper extremity supported Standing balance-Leahy Scale: Fair Standing balance comment: can step limited distance without UB support, rollator for gait                           ADL either performed or assessed with clinical judgement   ADL Overall ADL's : Modified independent                                       General ADL Comments: with use of AE.     Vision  Ability to See in Adequate Light: 0 Adequate Patient Visual Report: No change from baseline Vision Assessment?: No apparent visual deficits     Perception         Praxis         Pertinent Vitals/Pain Pain Assessment Pain Assessment: Faces Pain Score: 2  Pain Location: back - chronic Pain Descriptors / Indicators: Aching Pain Intervention(s): Limited activity within patient's tolerance, Monitored during session     Extremity/Trunk Assessment Upper Extremity Assessment Upper Extremity Assessment: Overall WFL for tasks assessed   Lower Extremity Assessment Lower Extremity Assessment: Defer to PT evaluation       Communication Communication Communication: No apparent difficulties   Cognition Arousal: Alert Behavior During Therapy: WFL for tasks assessed/performed Overall Cognitive Status: Within Functional Limits for tasks assessed                                 General Comments: very pleasant and conversational     General Comments       Exercises     Shoulder Instructions      Home Living  Family/patient expects to be discharged to:: Private residence Living Arrangements: Spouse/significant other;Children Available Help at Discharge: Family;Available 24 hours/day Type of Home: House Home Access: Stairs to enter [employer] of Steps: 4 Entrance Stairs-Rails: Right;Left;Can reach both Home Layout: One level     Bathroom Shower/Tub: Tub/shower unit;Walk-in shower   Bathroom Toilet: Standard     Home Equipment: Hand held shower head;Shower seat - built in;Rollator (4 wheels)          Prior Functioning/Environment Prior Level of Function : Needs assist;Working/employed;Driving             Mobility Comments: Rollator ADLs Comments: AE for LB ADL        OT Problem List: Decreased strength;Decreased activity tolerance;Impaired balance (sitting and/or standing);Pain      OT Treatment/Interventions:      OT  Goals(Current goals can be found in the care plan section) Acute Rehab OT Goals Patient Stated Goal: get better OT Goal Formulation: With patient Time For Goal Achievement: 01/07/23 Potential to Achieve Goals: Good  OT Frequency:      Co-evaluation              AM-PAC OT "6 Clicks" Daily Activity     Outcome Measure Help from another person eating meals?: None Help from another person taking care of personal grooming?: None Help from another person toileting, which includes using toliet, bedpan, or urinal?: None Help from another person bathing (including washing, rinsing, drying)?: None Help from another person to put on and taking off regular upper body clothing?: None Help from another person to put on and taking off regular lower body clothing?: None 6 Click Score: 24   End of Session Equipment Utilized During Treatment: Gait belt;Rollator (4 wheels) Nurse Communication: Mobility status  Activity Tolerance: Patient tolerated treatment well Patient left: in bed;with call bell/phone within reach  OT Visit Diagnosis: Unsteadiness on feet (R26.81);Muscle weakness (generalized) (M62.81);Pain                Time: 0950-1008 OT Time Calculation (min): 18 min Charges:  OT General Charges $OT Visit: 1 Visit OT Evaluation $OT Eval Low Complexity: 1 Low  Tyler Deis, OTR/L [facility] Acute Rehabilitation Office: [phone]   Myrla Halsted 12/24/2022, 12:49 PM

## 2022-12-24 NOTE — Progress Notes (Signed)
Daily Progress Note Intern Pager: [phone]  [name] Medical record number: [id number] Date of birth: 1965-09-14 Age: 57 y.o. Gender: male  Primary Care Provider: Bess Kinds, MD Consultants: CCM, Interventional Radiology  Code Status: Full  Pt Overview and Major Events to Date:  9/23: Admitted to ICU for submassive PE with right heart strain  9/23: Pulmonary arteriography thrombolysis, catheter directed 9/26: Transition from heparin gtt to DOAC  Assessment and Plan:  CHAPMAN TRETTIN is a 57yo M with hx of PE, DVT (on eliquis), HTN, HLD transferring from ICU following mechanical thrombolysis of submassive PE. Patient is symptomatically improving at this time, with healing hematoma at R groin puncture site. Planning transition from heparin gtt to oral DOAC today.  Assessment & Plan PE (pulmonary thromboembolism) (HCC) Patient presented with submassive PE with right heart strain s/p targeted thrombolysis with symptomatic improvement.  Prior to hospitalization, patient was not taking his Eliquis as directed.  Patient directed to take Eliquis 5 twice daily, but was instead taking both doses at once.  He was otherwise taking medications as directed. Patient transferred from ICU to FMTS morning of 9/25. BNP normalized to 59.5. Patient on heparin drip and satting well on room air. IR following. - Transition to Eliquis DOAC today  - Begin with loading dose Eliquis 10 BID x 4 days (s/p 3 days heparin gtt)  - Indefinite maintenance Eliquis 5 BID to follow  - Follow-up IR recs - Albuterol nebs q4 prn. O2 as needed.  - PT/OT eval and assess  - PT recs: patient without further therapy needs - Outpatient pulm fu Hematoma Healing hematoma surrounding peripheral thrombolysis catheter insertion site in the right groin. No signs of infection or other complications at this time. - Puncture site care - CTM Constipation Now resolved. Patient had normal BM without  difficulty or signs of bleeding.  - Continue Miralax and senna bowel regimen - CTM Lactic acidosis Patient with downtrending mild lactic acidosis, now resolved 3.9 > 2.6 > 2.2 > 1.1. Elevated liver enzymes Patient with stable, mildly elevated transaminases. ALT 56 > 62, AST 53 > 58. No abdominal symptoms at this time.  - AM CMP - CTM  Chronic and Stable Issues: HTN: Continue home Olmesartan 40, holding home amlodipine 5, hydrochlorothiazide 25 HLD: Continue home atorvastatin 40  Mood: Continue home duloxetine 30 SDOH: Social work consulted regarding medication costs/insurance/FSA spending.  FEN/GI: Regular diet PPx: Transition to DOAC today, starting with loading dose Eliquis 10 BID x 4 days Dispo: Home pending clinical stability following DOAC transition   Subjective:  Patient continues to feel better today.  His breathing and coughing symptoms are improving.  No hemoptysis.  He was able to have a normal bowel movement, no signs of bleeding.  He has been able to get up and walk around the room without issue.  No headache, vision changes, or sharp chest pain.  Objective: Temp:  [98.2 F (36.8 C)-98.7 F (37.1 C)] 98.2 F (36.8 C) (09/26 0753) Pulse Rate:  [72-82] 76 (09/26 0840) Resp:  [16-20] 18 (09/26 0840) BP: (120-152)/(83-96) 146/91 (09/26 0753) SpO2:  [98 %-100 %] 98 % (09/26 0840) Weight:  [106.4 kg] 106.4 kg (09/26 0707) Physical Exam: General: Well-appearing, no acute distress.  Sleeping. Cardiovascular: Normal S1/S2.  No extra heart sounds.  Warm and well-perfused. Respiratory: Breathing comfortably on room air. Expiratory crackles and scattered expiratory wheezes.  No increased work of breathing. Abdomen: Soft, nontender, nondistended. Extremities: Hematoma reducing in size  at R groin catheter site without erythema, induration, or drainage.  Extremities warm, dry, nonedematous.  Laboratory: Most recent CBC Lab Results  Component Value Date   WBC 8.0 12/24/2022    HGB 9.6 (L) 12/24/2022   HCT 28.2 (L) 12/24/2022   MCV 97.2 12/24/2022   PLT 113 (L) 12/24/2022   Most recent BMP    Latest Ref Rng & Units 12/24/2022    7:22 AM  BMP  Glucose 70 - 99 mg/dL 97   BUN 6 - 20 mg/dL 8   Creatinine 1.61 - 0.96 mg/dL 0.45   Sodium 409 - 811 mmol/L 131   Potassium 3.5 - 5.1 mmol/L 3.6   Chloride 98 - 111 mmol/L 102   CO2 22 - 32 mmol/L 24   Calcium 8.9 - 10.3 mg/dL 8.3    Lactic acid: 1.1 Heparin level: 0.28  Ivery Quale, MD 12/24/2022, 9:28 AM  PGY-1, [facility] FPTS Intern pager: [phone], text pages welcome Secure chat group [facility] Teaching Service

## 2022-12-24 NOTE — Progress Notes (Signed)
Referring Physician(s): Dr. Merrily Pew  Supervising Physician: Mir, Mauri Reading  Patient Status:  [name] - In-pt  Chief Complaint:  Pulmonary Embolus  Subjective:  9/23 IR procedure 9/23 Pulmonary Arteriography Thrombolysis, Catheter-directed  Feeling better Sitting in bed eating breakfast + hematoma at right groin with minimal tenderness Smaller than previous day  Boarders are marked  Allergies: Patient has no known allergies.  Medications: Prior to Admission medications   Medication Sig Start Date End Date Taking? Authorizing Provider  amLODipine (NORVASC) 5 MG tablet Take 1 tablet (5 mg total) by mouth at bedtime. 02/23/22  Yes Sabino Dick, DO  apixaban (ELIQUIS) 5 MG TABS tablet Take 1 tablet (5 mg total) by mouth 2 (two) times daily. 02/27/22  Yes Sowell, Apolinar Junes, MD  atorvastatin (LIPITOR) 40 MG tablet TAKE 1 TABLET BY MOUTH ONCE DAILY 12/05/21 01/10/23 Yes Sowell, Apolinar Junes, MD  DULoxetine (CYMBALTA) 30 MG capsule Take 1 capsule (30 mg total) by mouth daily. 09/25/22 12/24/22 Yes Jerre Simon, MD  hydrochlorothiazide (HYDRODIURIL) 25 MG tablet Take 1 tablet (25 mg total) by mouth daily. 08/18/22  Yes Espinoza, Myrlene Broker, DO  olmesartan (BENICAR) 40 MG tablet Take 1 tablet (40 mg total) by mouth at bedtime. 08/18/22  Yes Sabino Dick, DO  NON FORMULARY Pt uses a cpap nighlty Patient not taking: Reported on 12/21/2022    [provider]     Vital Signs: BP (!) 152/91 (BP Location: Right Arm)   Pulse 72   Temp 98.7 F (37.1 C) (Oral)   Resp 18   Ht 6\' 4"  (1.93 m)   Wt 234 lb 9.1 oz (106.4 kg)   SpO2 99%   BMI 28.55 kg/m   Physical Exam Skin:    General: Skin is warm and dry.     Comments: Right groin site positive for strong pedal pulse Right Positive for hematoma Ecchymosis noted in right groin No drainage or bleeding  in right groin Right groin soft to palpation  Neurological:     Mental Status: He is alert.  Psychiatric:        Mood and  Affect: Mood normal.        Behavior: Behavior normal. Behavior is not agitated.     Imaging: IR THROMB F/U EVAL ART/VEN FINAL DAY (MS)  Result Date: 12/22/2022 INDICATION: History of sub massive pulmonary embolism, post initiation of bilateral pulmonary arterial thrombolysis on 12/21/2022. Patient has completed prescribed course of bilateral pulmonary arterial lytic infusion and presents today for repeat pulmonary arterial pressure measurements prior to infusion catheter removal. EXAM: PULMONARY ARTERIAL PRESSURE MEASUREMENT AND INFUSION CATHETER(S) REMOVAL COMPARISON:  Chest XR, earlier same day. IR fluoroscopy, 12/21/2022. MEDICATIONS: None CONTRAST:  None FLUOROSCOPY TIME:  None COMPLICATIONS: None immediate. TECHNIQUE: The patient was positioned supine on her hospital bed. Review of this morning's chest radiograph demonstrates unchanged positioning of bilateral pulmonary arterial infusion catheters with tip of the right-sided pulmonary arterial infusion catheter approximately 5-10 cm peripheral to the expected outflow of the main pulmonary artery. As such, the right pulmonary arterial catheter's infusion wire was removed and the multi side-hole infusion catheter was retracted to the estimated location of the main pulmonary artery. Pressure measurements were acquired from this location. At this point, the procedure was terminated. All wires, catheters and sheaths were removed from the patient. Hemostasis was achieved at the right groin access site with manual compression. A dressing was placed. The patient tolerated the procedure well without immediate postprocedural complication. FINDINGS: Acquired pressure measurements as follows: Preprocedural main pulmonary artery-32/17;  mean-23 (normal: < 25/10) Postprocedural main pulmonary artery-31/12; mean-22 IMPRESSION: Successful bilateral catheter directed pulmonary arterial thrombolysis with reduction in pressure within the main pulmonary artery. Roanna Banning,  MD [facility] Radiology Electronically Signed   By: Roanna Banning M.D.   On: 12/22/2022 07:24   DG Chest Port 1 View  Result Date: 12/22/2022 CLINICAL DATA:  [id number] Post-operative state [id number] Pulmonary emboli s/p lytic catheter placement EXAM: PORTABLE CHEST 1 VIEW COMPARISON:  Chest XR and CTA chest, 12/01/2022. IR fluoroscopy, 12/21/2022. FINDINGS: Support lines: BILATERAL pulmonary arterial catheters well positioned and unchanged from IR procedure. Cardiomediastinal silhouette is within normal limits. Lungs are well inflated. No focal consolidation or mass. No pleural effusion or pneumothorax. No interval osseous abnormality. IMPRESSION: 1. Stable positioning of BILATERAL pulmonary arterial catheters. 2. No acute cardiopulmonary process. Electronically Signed   By: Roanna Banning M.D.   On: 12/22/2022 06:13   IR INFUSION THROMBOL ARTERIAL INITIAL (MS)  Result Date: 12/21/2022 INDICATION: Saddle embolus.  Submassive PE burden. EXAM: Procedures: 1. PULMONARY ARTERIOGRAPHY 2. PULMONARY ARTERIAL LYTIC INFUSION CATHETER PLACEMENT, BILATERAL COMPARISON:  CTA PE, 12/21/2022 and 06/03/2016. MEDICATIONS: Zofran 4 mg IV ANESTHESIA/SEDATION: Moderate (conscious) sedation was employed during this procedure. A total of Versed 1.5 mg and Fentanyl 75 mcg was administered intravenously. Moderate Sedation Time: 34 minutes. The patient's level of consciousness and vital signs were monitored continuously by radiology nursing throughout the procedure under my direct supervision. CONTRAST:  40mL OMNIPAQUE IOHEXOL 300 MG/ML  SOLN FLUOROSCOPY TIME:  Fluoroscopic dose; 38 mGy COMPLICATIONS: None immediate. TECHNIQUE: Informed written consent was obtained from the patient and/or patient's representative after a discussion of the risks, benefits and alternatives to treatment. Questions regarding the procedure were encouraged and answered. A timeout was performed prior to the  initiation of the procedure. Ultrasound scanning was performed of the RIGHT groin and demonstrated patency of the RIGHT common femoral vein. As such, the right common femoral vein was selected venous access. The RIGHT groin was prepped and draped in the usual sterile fashion, and a sterile drape was applied covering the operative field. Maximum barrier sterile technique with sterile gowns and gloves were used for the procedure. A timeout was performed prior to the initiation of the procedure. Local anesthesia was provided with 1% lidocaine. Under direct ultrasound guidance, the right common femoral vein was accessed with a micro puncture kit ultimately allowing placement of a 6 Fr 10 cm vascular sheath. Slightly cranial to this initial access, the right common femoral was again accessed with an additional micropuncture kit ultimately allowing placement of an additional 6 Fr 10 cm vascular sheath. Ultrasound images were saved for procedural documentation purposes. With the use of a Bentson wire, an angled pulmonary catheter was advanced into the main pulmonary artery and a limited central pulmonary arteriogram was performed. Pressure measurements were then obtained from the main pulmonary artery. A 5 Fr angled pigtail catheter was advanced into the distal branch of the left lower lobe pulmonary artery. Limited contrast injection confirmed appropriate positioning. Over an exchange length Pollyann Kennedy, the catheter was exchanged for a 90/10 cm multi side-hole infusion catheter. The procedure was then repeated on the patient's RIGHT pulmonary artery, and with the use of a stiff Amplatz, a vertebral catheter was advanced into a distal branch of the RIGHT lower lobe pulmonary artery. Limited contrast injection confirmed appropriate positioning. Over an exchange length stiff Amplatz, the catheter was exchanged for a 90/10 cm multi side-hole infusion catheter. A postprocedural fluoroscopic image was  obtained to document final  catheter positioning. The external catheter tubing was secured at the right thigh and the lytic therapy was initiated. The patient tolerated the procedure well without immediate postprocedural complication. FINDINGS: Central pulmonary arteriogram demonstrates significant embolic burden at the BILATERAL pulmonary arterial bifurcations. Acquired pressure measurements: Main pulmonary artery 32/17; mean 23 (normal: < 25/10) Following the procedure, both infusion catheter tips terminate within the distal aspects of the bilateral lower lobe sub segmental pulmonary arteries. IMPRESSION: 1. Successful initiation of bilateral catheter directed pulmonary arterial lysis for submassive pulmonary embolism. 2. Significant embolic burden at the BILATERAL pulmonary arterial bifurcations 3. Elevated pressure measurements within the main pulmonary artery, compatible with pulmonary arterial hypertension. PLAN: Lytic catheter infusion rates as described per order set. Vascular Interventional Radiology will follow the patient with anticipated catheter removal during A.M. rounds. Roanna Banning, MD [facility] Radiology Electronically Signed   By: Roanna Banning M.D.   On: 12/21/2022 16:18   IR INFUSION THROMBOL ARTERIAL INITIAL (MS)  Result Date: 12/21/2022 INDICATION: Saddle embolus.  Submassive PE burden. EXAM: Procedures: 1. PULMONARY ARTERIOGRAPHY 2. PULMONARY ARTERIAL LYTIC INFUSION CATHETER PLACEMENT, BILATERAL COMPARISON:  CTA PE, 12/21/2022 and 06/03/2016. MEDICATIONS: Zofran 4 mg IV ANESTHESIA/SEDATION: Moderate (conscious) sedation was employed during this procedure. A total of Versed 1.5 mg and Fentanyl 75 mcg was administered intravenously. Moderate Sedation Time: 34 minutes. The patient's level of consciousness and vital signs were monitored continuously by radiology nursing throughout the procedure under my direct supervision. CONTRAST:  40mL OMNIPAQUE IOHEXOL 300 MG/ML  SOLN  FLUOROSCOPY TIME:  Fluoroscopic dose; 38 mGy COMPLICATIONS: None immediate. TECHNIQUE: Informed written consent was obtained from the patient and/or patient's representative after a discussion of the risks, benefits and alternatives to treatment. Questions regarding the procedure were encouraged and answered. A timeout was performed prior to the initiation of the procedure. Ultrasound scanning was performed of the RIGHT groin and demonstrated patency of the RIGHT common femoral vein. As such, the right common femoral vein was selected venous access. The RIGHT groin was prepped and draped in the usual sterile fashion, and a sterile drape was applied covering the operative field. Maximum barrier sterile technique with sterile gowns and gloves were used for the procedure. A timeout was performed prior to the initiation of the procedure. Local anesthesia was provided with 1% lidocaine. Under direct ultrasound guidance, the right common femoral vein was accessed with a micro puncture kit ultimately allowing placement of a 6 Fr 10 cm vascular sheath. Slightly cranial to this initial access, the right common femoral was again accessed with an additional micropuncture kit ultimately allowing placement of an additional 6 Fr 10 cm vascular sheath. Ultrasound images were saved for procedural documentation purposes. With the use of a Bentson wire, an angled pulmonary catheter was advanced into the main pulmonary artery and a limited central pulmonary arteriogram was performed. Pressure measurements were then obtained from the main pulmonary artery. A 5 Fr angled pigtail catheter was advanced into the distal branch of the left lower lobe pulmonary artery. Limited contrast injection confirmed appropriate positioning. Over an exchange length Pollyann Kennedy, the catheter was exchanged for a 90/10 cm multi side-hole infusion catheter. The procedure was then repeated on the patient's RIGHT pulmonary artery, and with the use of a stiff  Amplatz, a vertebral catheter was advanced into a distal branch of the RIGHT lower lobe pulmonary artery. Limited contrast injection confirmed appropriate positioning. Over an exchange length stiff Amplatz, the catheter was exchanged for a  90/10 cm multi side-hole infusion catheter. A postprocedural fluoroscopic image was obtained to document final catheter positioning. The external catheter tubing was secured at the right thigh and the lytic therapy was initiated. The patient tolerated the procedure well without immediate postprocedural complication. FINDINGS: Central pulmonary arteriogram demonstrates significant embolic burden at the BILATERAL pulmonary arterial bifurcations. Acquired pressure measurements: Main pulmonary artery 32/17; mean 23 (normal: < 25/10) Following the procedure, both infusion catheter tips terminate within the distal aspects of the bilateral lower lobe sub segmental pulmonary arteries. IMPRESSION: 1. Successful initiation of bilateral catheter directed pulmonary arterial lysis for submassive pulmonary embolism. 2. Significant embolic burden at the BILATERAL pulmonary arterial bifurcations 3. Elevated pressure measurements within the main pulmonary artery, compatible with pulmonary arterial hypertension. PLAN: Lytic catheter infusion rates as described per order set. Vascular Interventional Radiology will follow the patient with anticipated catheter removal during A.M. rounds. Roanna Banning, MD [facility] Radiology Electronically Signed   By: Roanna Banning M.D.   On: 12/21/2022 16:18   IR US Guide Vasc Access Right  Result Date: 12/21/2022 INDICATION: Saddle embolus.  Submassive PE burden. EXAM: Procedures: 1. PULMONARY ARTERIOGRAPHY 2. PULMONARY ARTERIAL LYTIC INFUSION CATHETER PLACEMENT, BILATERAL COMPARISON:  CTA PE, 12/21/2022 and 06/03/2016. MEDICATIONS: Zofran 4 mg IV ANESTHESIA/SEDATION: Moderate (conscious) sedation was employed  during this procedure. A total of Versed 1.5 mg and Fentanyl 75 mcg was administered intravenously. Moderate Sedation Time: 34 minutes. The patient's level of consciousness and vital signs were monitored continuously by radiology nursing throughout the procedure under my direct supervision. CONTRAST:  40mL OMNIPAQUE IOHEXOL 300 MG/ML  SOLN FLUOROSCOPY TIME:  Fluoroscopic dose; 38 mGy COMPLICATIONS: None immediate. TECHNIQUE: Informed written consent was obtained from the patient and/or patient's representative after a discussion of the risks, benefits and alternatives to treatment. Questions regarding the procedure were encouraged and answered. A timeout was performed prior to the initiation of the procedure. Ultrasound scanning was performed of the RIGHT groin and demonstrated patency of the RIGHT common femoral vein. As such, the right common femoral vein was selected venous access. The RIGHT groin was prepped and draped in the usual sterile fashion, and a sterile drape was applied covering the operative field. Maximum barrier sterile technique with sterile gowns and gloves were used for the procedure. A timeout was performed prior to the initiation of the procedure. Local anesthesia was provided with 1% lidocaine. Under direct ultrasound guidance, the right common femoral vein was accessed with a micro puncture kit ultimately allowing placement of a 6 Fr 10 cm vascular sheath. Slightly cranial to this initial access, the right common femoral was again accessed with an additional micropuncture kit ultimately allowing placement of an additional 6 Fr 10 cm vascular sheath. Ultrasound images were saved for procedural documentation purposes. With the use of a Bentson wire, an angled pulmonary catheter was advanced into the main pulmonary artery and a limited central pulmonary arteriogram was performed. Pressure measurements were then obtained from the main pulmonary artery. A 5 Fr angled pigtail catheter was advanced  into the distal branch of the left lower lobe pulmonary artery. Limited contrast injection confirmed appropriate positioning. Over an exchange length Pollyann Kennedy, the catheter was exchanged for a 90/10 cm multi side-hole infusion catheter. The procedure was then repeated on the patient's RIGHT pulmonary artery, and with the use of a stiff Amplatz, a vertebral catheter was advanced into a distal branch of the RIGHT lower lobe pulmonary artery. Limited contrast injection confirmed appropriate positioning. Over an  exchange length stiff Amplatz, the catheter was exchanged for a 90/10 cm multi side-hole infusion catheter. A postprocedural fluoroscopic image was obtained to document final catheter positioning. The external catheter tubing was secured at the right thigh and the lytic therapy was initiated. The patient tolerated the procedure well without immediate postprocedural complication. FINDINGS: Central pulmonary arteriogram demonstrates significant embolic burden at the BILATERAL pulmonary arterial bifurcations. Acquired pressure measurements: Main pulmonary artery 32/17; mean 23 (normal: < 25/10) Following the procedure, both infusion catheter tips terminate within the distal aspects of the bilateral lower lobe sub segmental pulmonary arteries. IMPRESSION: 1. Successful initiation of bilateral catheter directed pulmonary arterial lysis for submassive pulmonary embolism. 2. Significant embolic burden at the BILATERAL pulmonary arterial bifurcations 3. Elevated pressure measurements within the main pulmonary artery, compatible with pulmonary arterial hypertension. PLAN: Lytic catheter infusion rates as described per order set. Vascular Interventional Radiology will follow the patient with anticipated catheter removal during A.M. rounds. Roanna Banning, MD [facility]iology Special[facility] Radiology Electronically Signed   By: Roanna Banning M.D.   On: 12/21/2022 16:18   IR Angiogram Selective Each  Additional Vessel  Result Date: 12/21/2022 INDICATION: Saddle embolus.  Submassive PE burden. EXAM: Procedures: 1. PULMONARY ARTERIOGRAPHY 2. PULMONARY ARTERIAL LYTIC INFUSION CATHETER PLACEMENT, BILATERAL COMPARISON:  CTA PE, 12/21/2022 and 06/03/2016. MEDICATIONS: Zofran 4 mg IV ANESTHESIA/SEDATION: Moderate (conscious) sedation was employed during this procedure. A total of Versed 1.5 mg and Fentanyl 75 mcg was administered intravenously. Moderate Sedation Time: 34 minutes. The patient's level of consciousness and vital signs were monitored continuously by radiology nursing throughout the procedure under my direct supervision. CONTRAST:  40mL OMNIPAQUE IOHEXOL 300 MG/ML  SOLN FLUOROSCOPY TIME:  Fluoroscopic dose; 38 mGy COMPLICATIONS: None immediate. TECHNIQUE: Informed written consent was obtained from the patient and/or patient's representative after a discussion of the risks, benefits and alternatives to treatment. Questions regarding the procedure were encouraged and answered. A timeout was performed prior to the initiation of the procedure. Ultrasound scanning was performed of the RIGHT groin and demonstrated patency of the RIGHT common femoral vein. As such, the right common femoral vein was selected venous access. The RIGHT groin was prepped and draped in the usual sterile fashion, and a sterile drape was applied covering the operative field. Maximum barrier sterile technique with sterile gowns and gloves were used for the procedure. A timeout was performed prior to the initiation of the procedure. Local anesthesia was provided with 1% lidocaine. Under direct ultrasound guidance, the right common femoral vein was accessed with a micro puncture kit ultimately allowing placement of a 6 Fr 10 cm vascular sheath. Slightly cranial to this initial access, the right common femoral was again accessed with an additional micropuncture kit ultimately allowing placement of an additional 6 Fr 10 cm vascular sheath.  Ultrasound images were saved for procedural documentation purposes. With the use of a Bentson wire, an angled pulmonary catheter was advanced into the main pulmonary artery and a limited central pulmonary arteriogram was performed. Pressure measurements were then obtained from the main pulmonary artery. A 5 Fr angled pigtail catheter was advanced into the distal branch of the left lower lobe pulmonary artery. Limited contrast injection confirmed appropriate positioning. Over an exchange length Pollyann Kennedy, the catheter was exchanged for a 90/10 cm multi side-hole infusion catheter. The procedure was then repeated on the patient's RIGHT pulmonary artery, and with the use of a stiff Amplatz, a vertebral catheter was advanced into a distal branch of the RIGHT lower lobe  pulmonary artery. Limited contrast injection confirmed appropriate positioning. Over an exchange length stiff Amplatz, the catheter was exchanged for a 90/10 cm multi side-hole infusion catheter. A postprocedural fluoroscopic image was obtained to document final catheter positioning. The external catheter tubing was secured at the right thigh and the lytic therapy was initiated. The patient tolerated the procedure well without immediate postprocedural complication. FINDINGS: Central pulmonary arteriogram demonstrates significant embolic burden at the BILATERAL pulmonary arterial bifurcations. Acquired pressure measurements: Main pulmonary artery 32/17; mean 23 (normal: < 25/10) Following the procedure, both infusion catheter tips terminate within the distal aspects of the bilateral lower lobe sub segmental pulmonary arteries. IMPRESSION: 1. Successful initiation of bilateral catheter directed pulmonary arterial lysis for submassive pulmonary embolism. 2. Significant embolic burden at the BILATERAL pulmonary arterial bifurcations 3. Elevated pressure measurements within the main pulmonary artery, compatible with pulmonary arterial hypertension. PLAN: Lytic  catheter infusion rates as described per order set. Vascular Interventional Radiology will follow the patient with anticipated catheter removal during A.M. rounds. Roanna Banning, MD [facility] and Interventional Radiology Specialists [facility] Radiology Electronically Signed   By: Roanna Banning M.D.   On: 12/21/2022 16:18   IR Angiogram Selective Each Additional Vessel  Result Date: 12/21/2022 INDICATION: Saddle embolus.  Submassive PE burden. EXAM: Procedures: 1. PULMONARY ARTERIOGRAPHY 2. PULMONARY ARTERIAL LYTIC INFUSION CATHETER PLACEMENT, BILATERAL COMPARISON:  CTA PE, 12/21/2022 and 06/03/2016. MEDICATIONS: Zofran 4 mg IV ANESTHESIA/SEDATION: Moderate (conscious) sedation was employed during this procedure. A total of Versed 1.5 mg and Fentanyl 75 mcg was administered intravenously. Moderate Sedation Time: 34 minutes. The patient's level of consciousness and vital signs were monitored continuously by radiology nursing throughout the procedure under my direct supervision. CONTRAST:  40mL OMNIPAQUE IOHEXOL 300 MG/ML  SOLN FLUOROSCOPY TIME:  Fluoroscopic dose; 38 mGy COMPLICATIONS: None immediate. TECHNIQUE: Informed written consent was obtained from the patient and/or patient's representative after a discussion of the risks, benefits and alternatives to treatment. Questions regarding the procedure were encouraged and answered. A timeout was performed prior to the initiation of the procedure. Ultrasound scanning was performed of the RIGHT groin and demonstrated patency of the RIGHT common femoral vein. As such, the right common femoral vein was selected venous access. The RIGHT groin was prepped and draped in the usual sterile fashion, and a sterile drape was applied covering the operative field. Maximum barrier sterile technique with sterile gowns and gloves were used for the procedure. A timeout was performed prior to the initiation of the procedure. Local anesthesia was provided with 1% lidocaine. Under direct  ultrasound guidance, the right common femoral vein was accessed with a micro puncture kit ultimately allowing placement of a 6 Fr 10 cm vascular sheath. Slightly cranial to this initial access, the right common femoral was again accessed with an additional micropuncture kit ultimately allowing placement of an additional 6 Fr 10 cm vascular sheath. Ultrasound images were saved for procedural documentation purposes. With the use of a Bentson wire, an angled pulmonary catheter was advanced into the main pulmonary artery and a limited central pulmonary arteriogram was performed. Pressure measurements were then obtained from the main pulmonary artery. A 5 Fr angled pigtail catheter was advanced into the distal branch of the left lower lobe pulmonary artery. Limited contrast injection confirmed appropriate positioning. Over an exchange length Pollyann Kennedy, the catheter was exchanged for a 90/10 cm multi side-hole infusion catheter. The procedure was then repeated on the patient's RIGHT pulmonary artery, and with the use of a stiff Amplatz, a vertebral catheter was  advanced into a distal branch of the RIGHT lower lobe pulmonary artery. Limited contrast injection confirmed appropriate positioning. Over an exchange length stiff Amplatz, the catheter was exchanged for a 90/10 cm multi side-hole infusion catheter. A postprocedural fluoroscopic image was obtained to document final catheter positioning. The external catheter tubing was secured at the right thigh and the lytic therapy was initiated. The patient tolerated the procedure well without immediate postprocedural complication. FINDINGS: Central pulmonary arteriogram demonstrates significant embolic burden at the BILATERAL pulmonary arterial bifurcations. Acquired pressure measurements: Main pulmonary artery 32/17; mean 23 (normal: < 25/10) Following the procedure, both infusion catheter tips terminate within the distal aspects of the bilateral lower lobe sub segmental pulmonary  arteries. IMPRESSION: 1. Successful initiation of bilateral catheter directed pulmonary arterial lysis for submassive pulmonary embolism. 2. Significant embolic burden at the BILATERAL pulmonary arterial bifurcations 3. Elevated pressure measurements within the main pulmonary artery, compatible with pulmonary arterial hypertension. PLAN: Lytic catheter infusion rates as described per order set. Vascular Interventional Radiology will follow the patient with anticipated catheter removal during A.M. rounds. Roanna Banning, MD [facility] Electronically Signed   By: Roanna Banning M.D.   On: 12/21/2022 16:18   IR Angiogram Pulmonary Bilateral Selective  Result Date: 12/21/2022 INDICATION: Saddle embolus.  Submassive PE burden. EXAM: Procedures: 1. PULMONARY ARTERIOGRAPHY 2. PULMONARY ARTERIAL LYTIC INFUSION CATHETER PLACEMENT, BILATERAL COMPARISON:  CTA PE, 12/21/2022 and 06/03/2016. MEDICATIONS: Zofran 4 mg IV ANESTHESIA/SEDATION: Moderate (conscious) sedation was employed during this procedure. A total of Versed 1.5 mg and Fentanyl 75 mcg was administered intravenously. Moderate Sedation Time: 34 minutes. The patient's level of consciousness and vital signs were monitored continuously by radiology nursing throughout the procedure under my direct supervision. CONTRAST:  40mL OMNIPAQUE IOHEXOL 300 MG/ML  SOLN FLUOROSCOPY TIME:  Fluoroscopic dose; 38 mGy COMPLICATIONS: None immediate. TECHNIQUE: Informed written consent was obtained from the patient and/or patient's representative after a discussion of the risks, benefits and alternatives to treatment. Questions regarding the procedure were encouraged and answered. A timeout was performed prior to the initiation of the procedure. Ultrasound scanning was performed of the RIGHT groin and demonstrated patency of the RIGHT common femoral vein. As such, the right common femoral vein was selected venous access. The  RIGHT groin was prepped and draped in the usual sterile fashion, and a sterile drape was applied covering the operative field. Maximum barrier sterile technique with sterile gowns and gloves were used for the procedure. A timeout was performed prior to the initiation of the procedure. Local anesthesia was provided with 1% lidocaine. Under direct ultrasound guidance, the right common femoral vein was accessed with a micro puncture kit ultimately allowing placement of a 6 Fr 10 cm vascular sheath. Slightly cranial to this initial access, the right common femoral was again accessed with an additional micropuncture kit ultimately allowing placement of an additional 6 Fr 10 cm vascular sheath. Ultrasound images were saved for procedural documentation purposes. With the use of a Bentson wire, an angled pulmonary catheter was advanced into the main pulmonary artery and a limited central pulmonary arteriogram was performed. Pressure measurements were then obtained from the main pulmonary artery. A 5 Fr angled pigtail catheter was advanced into the distal branch of the left lower lobe pulmonary artery. Limited contrast injection confirmed appropriate positioning. Over an exchange length Pollyann Kennedy, the catheter was exchanged for a 90/10 cm multi side-hole infusion catheter. The procedure was then repeated on the patient's RIGHT pulmonary artery, and with the  use of a stiff Amplatz, a vertebral catheter was advanced into a distal branch of the RIGHT lower lobe pulmonary artery. Limited contrast injection confirmed appropriate positioning. Over an exchange length stiff Amplatz, the catheter was exchanged for a 90/10 cm multi side-hole infusion catheter. A postprocedural fluoroscopic image was obtained to document final catheter positioning. The external catheter tubing was secured at the right thigh and the lytic therapy was initiated. The patient tolerated the procedure well without immediate postprocedural complication. FINDINGS:  Central pulmonary arteriogram demonstrates significant embolic burden at the BILATERAL pulmonary arterial bifurcations. Acquired pressure measurements: Main pulmonary artery 32/17; mean 23 (normal: < 25/10) Following the procedure, both infusion catheter tips terminate within the distal aspects of the bilateral lower lobe sub segmental pulmonary arteries. IMPRESSION: 1. Successful initiation of bilateral catheter directed pulmonary arterial lysis for submassive pulmonary embolism. 2. Significant embolic burden at the BILATERAL pulmonary arterial bifurcations 3. Elevated pressure measurements within the main pulmonary artery, compatible with pulmonary arterial hypertension. PLAN: Lytic catheter infusion rates as described per order set. Vascular Interventional Radiology will follow the patient with anticipated catheter removal during A.M. rounds. Roanna Banning, MD [facility] Electronically Signed   By: Roanna Banning M.D.   On: 12/21/2022 16:18   ECHOCARDIOGRAM COMPLETE  Result Date: 12/21/2022    ECHOCARDIOGRAM REPORT   Patient Name:   Jeffrey [name] Date of Exam: 12/21/2022 Medical Rec #:  [id number]        Height:       76.0 in Accession #:    [id number]       Weight:       235.2 lb Date of Birth:  [birth year]        BSA:          2.373 m Patient Age:    57 years         BP:           108/89 mmHg Patient Gender: M                HR:           112 bpm. Exam Location:  Inpatient Procedure: 2D Echo, Color Doppler and Cardiac Doppler STAT ECHO Indications:    Pulmonary Embolus  History:        Patient has prior history of Echocardiogram examinations, most                 recent 09/24/2016. Pulmonary Embolus; Risk Factors:Dyslipidemia                 and hx of Polysubstance Use.  Sonographer:    Milbert Coulter Referring Phys: [id number] SUDHAM CHAND IMPRESSIONS  1. Left ventricular ejection fraction, by estimation, is 60 to 65%. The left ventricle has normal  function. The left ventricle has no regional wall motion abnormalities. There is severe eccentric left ventricular hypertrophy. Left ventricular diastolic parameters were normal. There is the interventricular septum is flattened in systole and diastole, consistent with right ventricular pressure and volume overload.  2. Right ventricular systolic function is severely reduced. The right ventricular size is severely enlarged. There is normal pulmonary artery systolic pressure.  3. Right atrial size was moderately dilated.  4. The mitral valve is normal in structure. Trivial mitral valve regurgitation. No evidence of mitral stenosis.  5. The aortic valve is tricuspid. Aortic valve regurgitation is not visualized. No aortic stenosis is present.  6. The inferior vena cava is dilated in size with <50%  respiratory variability, suggesting right atrial pressure of 15 mmHg. Comparison(s): Changes from prior study are noted. Conclusion(s)/Recommendation(s): Severe RV dilation and severely decreased RV function. Elevated RAP though RVSP normal by current measurements. Findings communicated to Dr. Merrily Pew. FINDINGS  Left Ventricle: Left ventricular ejection fraction, by estimation, is 60 to 65%. The left ventricle has normal function. The left ventricle has no regional wall motion abnormalities. The left ventricular internal cavity size was normal in size. There is  severe eccentric left ventricular hypertrophy. The interventricular septum is flattened in systole and diastole, consistent with right ventricular pressure and volume overload. Left ventricular diastolic parameters were normal. Right Ventricle: The right ventricular size is severely enlarged. Right vetricular wall thickness was not well visualized. Right ventricular systolic function is severely reduced. There is normal pulmonary artery systolic pressure. The tricuspid regurgitant velocity is 1.91 m/s, and with an assumed right atrial pressure of 15 mmHg, the estimated  right ventricular systolic pressure is 29.6 mmHg. Left Atrium: Left atrial size was normal in size. Right Atrium: Right atrial size was moderately dilated. Pericardium: There is no evidence of pericardial effusion. Presence of epicardial fat layer. Mitral Valve: The mitral valve is normal in structure. Trivial mitral valve regurgitation. No evidence of mitral valve stenosis. Tricuspid Valve: The tricuspid valve is grossly normal. Tricuspid valve regurgitation is mild . No evidence of tricuspid stenosis. Aortic Valve: The aortic valve is tricuspid. Aortic valve regurgitation is not visualized. No aortic stenosis is present. Pulmonic Valve: The pulmonic valve was not well visualized. Pulmonic valve regurgitation is not visualized. Aorta: The aortic root, ascending aorta, aortic arch and descending aorta are all structurally normal, with no evidence of dilitation or obstruction. Venous: The inferior vena cava is dilated in size with less than 50% respiratory variability, suggesting right atrial pressure of 15 mmHg. IAS/Shunts: The atrial septum is grossly normal.  LEFT VENTRICLE PLAX 2D LVIDd:         2.50 cm   Diastology LVIDs:         1.60 cm   LV e' medial:    5.66 cm/s LV PW:         1.70 cm   LV E/e' medial:  6.6 LV IVS:        2.39 cm   LV e' lateral:   5.11 cm/s LVOT diam:     2.10 cm   LV E/e' lateral: 7.3 LVOT Area:     3.46 cm  RIGHT VENTRICLE RV Basal diam:  4.90 cm RV Mid diam:    3.80 cm RV S prime:     4.57 cm/s TAPSE (M-mode): 0.9 cm LEFT ATRIUM             Index        RIGHT ATRIUM           Index LA diam:        3.00 cm 1.26 cm/m   RA Area:     25.00 cm LA Vol (A2C):   25.3 ml 10.66 ml/m  RA Volume:   89.70 ml  37.79 ml/m LA Vol (A4C):   34.1 ml 14.37 ml/m LA Biplane Vol: 31.6 ml 13.31 ml/m   AORTA Ao Root diam: 3.60 cm Ao Asc diam:  3.40 cm MITRAL VALVE               TRICUSPID VALVE MV Area (PHT): 3.54 cm    TR Peak grad:   14.6 mmHg MV Decel Time: 214 msec    TR Vmax:  191.00 cm/s MV E  velocity: 37.50 cm/s MV A velocity: 54.50 cm/s  SHUNTS MV E/A ratio:  0.69        Systemic Diam: 2.10 cm Jodelle Red MD Electronically signed by Jodelle Red MD Signature Date/Time: 12/21/2022/10:44:54 AM    Final    CT Angio Chest PE W and/or Wo Contrast  Result Date: 12/21/2022 CLINICAL DATA:  Pulmonary embolism suspected. Shortness of breath. Tachycardia. EXAM: CT ANGIOGRAPHY CHEST WITH CONTRAST TECHNIQUE: Multidetector CT imaging of the chest was performed using the standard protocol during bolus administration of intravenous contrast. Multiplanar CT image reconstructions and MIPs were obtained to evaluate the vascular anatomy. RADIATION DOSE REDUCTION: This exam was performed according to the departmental dose-optimization program which includes automated exposure control, adjustment of the mA and/or kV according to patient size and/or use of iterative reconstruction technique. CONTRAST:  75mL OMNIPAQUE IOHEXOL 350 MG/ML SOLN COMPARISON:  08/02/2022 FINDINGS: Cardiovascular: Heart size is normal. However, there is right ventricular strain with ratio of 2.05. There is massive bilateral pulmonary embolism. The aorta shows mild atherosclerotic change. Mediastinum/Nodes: No mass or lymphadenopathy. Lungs/Pleura: The lungs are clear.  No pleural effusion. Upper Abdomen: Mild fatty change of the liver. Musculoskeletal: Ordinary degenerative change of the spine. Review of the MIP images confirms the above findings. IMPRESSION: 1. Positive for acute PE with CT evidence of right heart strain (RV/LV Ratio = 2.05) consistent with at least submassive (intermediate risk) PE. The presence of right heart strain has been associated with an increased risk of morbidity and mortality. Please refer to the "Code PE Focused" order set in EPIC. 2. Critical Value/emergent results were called by telephone at the time of interpretation on 12/21/2022 at 6:55 am to provider [facility] , who verbally acknowledged  these results. Aortic Atherosclerosis (ICD10-I70.0). Electronically Signed   By: Paulina Fusi M.D.   On: 12/21/2022 06:57   DG Chest Portable 1 View  Result Date: 12/21/2022 CLINICAL DATA:  Chest pain and shortness of breath. EXAM: PORTABLE CHEST 1 VIEW COMPARISON:  08/02/2022 FINDINGS: Heart size and mediastinal contours are unremarkable. No pleural fluid, interstitial edema or airspace disease. Visualized osseous structures are unremarkable. IMPRESSION: Negative portable chest. Electronically Signed   By: Signa Kell M.D.   On: 12/21/2022 06:33    Labs:  CBC: Recent Labs    12/21/22 2207 12/22/22 0654 12/22/22 0943 12/22/22 1642  WBC 8.6 8.7 9.2 8.6  HGB 12.2* 11.7* 11.5* 11.1*  HCT 35.9* 34.7* 33.5* 32.2*  PLT 116* 108* 114* 112*    COAGS: Recent Labs    12/21/22 0653 12/21/22 1351 12/22/22 0024 12/22/22 0654  APTT 93* 59* 149* 103*    BMP: Recent Labs    12/21/22 0557 12/21/22 0603 12/21/22 1351 12/22/22 0654 12/23/22 0808  NA 133* 136 134* 132* 133*  K 2.6* 2.6* 3.4* 3.4* 3.5  CL 95* 97* 99 99 98  CO2 21*  --  21* 23 24  GLUCOSE 316* 306* 190* 161* 101*  BUN 16 17 15 13 11   CALCIUM 8.7*  --  8.4* 8.1* 8.1*  CREATININE 1.59* 1.50* 1.35* 0.90 0.90  GFRNONAA 50*  --  >60 >60 >60    LIVER FUNCTION TESTS: Recent Labs    02/23/22 1422 09/14/22 0918 12/21/22 0557 12/23/22 0808  BILITOT 0.2 0.4 0.7 0.6  AST 16 21 205* 53*  ALT 13 22 105* 56*  ALKPHOS 112 67 101 58  PROT 6.6 7.0 6.9 5.5*  ALBUMIN 4.0 3.9 3.5 2.7*  Assessment and Plan: PE post thrombolysis in IR on 9/23 Positive hematoma in right groin / resolving IR will follow    Electronically Signed: Ardith Dark, NP 12/24/2022, 7:22 AM   I spent a total of 15 Minutes at the the patient's bedside AND on the patient's hospital floor or unit, greater than 50% of which was counseling/coordinating care for Pulmonary embolus lysis / right groin hematoma follow-up

## 2022-12-24 NOTE — Assessment & Plan Note (Signed)
Healing hematoma surrounding peripheral thrombolysis catheter insertion site in the right groin. No signs of infection or other complications at this time. - Puncture site care - CTM

## 2022-12-24 NOTE — Assessment & Plan Note (Addendum)
Patient presented with submassive PE with right heart strain s/p targeted thrombolysis with symptomatic improvement.  Prior to hospitalization, patient was not taking his Eliquis as directed.  Patient directed to take Eliquis 5 twice daily, but was instead taking both doses at once.  He was otherwise taking medications as directed. Patient transferred from ICU to FMTS morning of 9/25. BNP normalized to 59.5. Patient on heparin drip and satting well on room air. IR following. - Transition to Eliquis DOAC today  - Begin with loading dose Eliquis 10 BID x 4 days (s/p 3 days heparin gtt)  - Indefinite maintenance Eliquis 5 BID to follow  - Follow-up IR recs - Albuterol nebs q4 prn. O2 as needed.  - PT/OT eval and assess  - PT recs: patient without further therapy needs - Outpatient pulm fu

## 2022-12-24 NOTE — Assessment & Plan Note (Addendum)
Patient with stable, mildly elevated transaminases. ALT 56 > 62, AST 53 > 58. No abdominal symptoms at this time.  - AM CMP - CTM

## 2022-12-24 NOTE — Assessment & Plan Note (Addendum)
Patient with downtrending mild lactic acidosis, now resolved 3.9 > 2.6 > 2.2 > 1.1.

## 2022-12-24 NOTE — Evaluation (Signed)
Physical Therapy Brief Evaluation and Discharge Note Patient Details Name: Jeffrey [name] MRN: [id number] DOB: [birth year] Today's Date: 12/24/2022   History of Present Illness  57 yo male admitted 9/23 with SOB and tachycardia. Pt with submassive PE, Pulmonary HTN w R heart dysfunction s/[name] IR thrombolysis 9/23 with Rt groin sheath removed 9/24. PMhx: PE  Clinical Impression  Pt pleasant, walks with rollator and has assist of wife at home PRN. Pt with long standing back and mobility issues due to back pain limiting gait distance and posture. Pt at baseline functional status with SPO2 94-98% on RA with HR 116. Pt without further therapy needs and encouraged to continue daily ambulation acutely as well as upon return home.        PT Assessment Patient does not need any further PT services  Assistance Needed at Discharge  PRN    Equipment Recommendations None recommended by PT  Recommendations for Other Services       Precautions/Restrictions Precautions Precautions: Fall        Mobility  Bed Mobility Rolling: Modified independent (Device/Increase time) Supine/Sidelying to sit: Modified independent (Device/Increased time) Sit to supine/sidelying: Modified independent (Device/Increased time)    Transfers Overall transfer level: Modified independent                      Ambulation/Gait Ambulation/Gait assistance: Modified independent (Device/Increase time) Gait Distance (Feet): 400 Feet Assistive device: Rollator (4 wheels) Gait Pattern/deviations: Step-through pattern, Decreased stride length, Trunk flexed Gait Speed: Pace WFL General Gait Details: pt with significantly kyphotic posture, walking in flexion throughout which he reports as baseline due to back pain and prior sx. no LOB, reliant on rollator with functional speed  Home Activity Instructions    Stairs Stairs: Yes Stairs assistance: Supervision Stair Management: Step to pattern, Forwards, Two  rails Number of Stairs: 3 General stair comments: reliant on rail and reports wife managing rollator on stairs at home  Modified Rankin (Stroke Patients Only)        Balance Overall balance assessment: Needs assistance   Sitting balance-Leahy Scale: Good     Standing balance support: Reliant on assistive device for balance, Bilateral upper extremity supported, No upper extremity supported Standing balance-Leahy Scale: Fair Standing balance comment: can step limited distance without UB support, rollator for gait          Pertinent Vitals/Pain PT - Brief Vital Signs All Vital Signs Stable: Yes Pain Assessment Pain Assessment: 0-10 Pain Score: 4  Pain Location: back - chronic Pain Descriptors / Indicators: Aching Pain Intervention(s): Limited activity within patient's tolerance, Repositioned, Monitored during session     Home Living Family/patient expects to be discharged to:: Private residence Living Arrangements: Spouse/significant other;Children Available Help at Discharge: Family;Available PRN/intermittently Home Environment: Stairs to enter  Stairs-Number of Steps: 4 Home Equipment: Hand held shower head;Shower seat - built in;Rollator (4 wheels)        Prior Function Level of Independence: Independent with assistive device(s) Comments: pt walks with rollator, drives, performs ADLs and assists with IaDLs    UE/LE Assessment   UE ROM/Strength/Tone/Coordination: Generalized weakness    LE ROM/Strength/Tone/Coordination: Generalized weakness      Communication   Communication Communication: No apparent difficulties     Cognition Overall Cognitive Status: Appears within functional limits for tasks assessed/performed       General Comments      Exercises     Assessment/Plan    PT Problem List  PT Visit Diagnosis Other abnormalities of gait and mobility (R26.89)    No Skilled PT Patient at baseline level of functioning;Patient will  have necessary level of assist by caregiver at discharge;Patient is modified independent with all activity/mobility   Co-evaluation                AMPAC 6 Clicks Help needed turning from your back to your side while in a flat bed without using bedrails?: None Help needed moving from lying on your back to sitting on the side of a flat bed without using bedrails?: None Help needed moving to and from a bed to a chair (including a wheelchair)?: None Help needed standing up from a chair using your arms (e.g., wheelchair or bedside chair)?: None Help needed to walk in hospital room?: None Help needed climbing 3-5 steps with a railing? : None 6 Click Score: 24      End of Session   Activity Tolerance: Patient tolerated treatment well Patient left: with call bell/phone within reach;in bed (pt denied OOB to chair due to wanting to sleep) Nurse Communication: Mobility status PT Visit Diagnosis: Other abnormalities of gait and mobility (R26.89)     Time: 1610-9604 PT Time Calculation (min) (ACUTE ONLY): 13 min  Charges:   PT Evaluation $PT Eval Low Complexity: 1 Low      Jeffrey [name], PT Acute Rehabilitation Services Office: [phone]   Jeffrey [name]  12/24/2022, 10:09 AM

## 2022-12-25 ENCOUNTER — Other Ambulatory Visit (HOSPITAL_COMMUNITY): Payer: Self-pay

## 2022-12-25 DIAGNOSIS — D649 Anemia, unspecified: Secondary | ICD-10-CM | POA: Insufficient documentation

## 2022-12-25 LAB — CBC
HCT: 27.8 % — ABNORMAL LOW (ref 39.0–52.0)
Hemoglobin: 9.6 g/dL — ABNORMAL LOW (ref 13.0–17.0)
MCH: 34.4 pg — ABNORMAL HIGH (ref 26.0–34.0)
MCHC: 34.5 g/dL (ref 30.0–36.0)
MCV: 99.6 fL (ref 80.0–100.0)
Platelets: 133 10*3/uL — ABNORMAL LOW (ref 150–400)
RBC: 2.79 MIL/uL — ABNORMAL LOW (ref 4.22–5.81)
RDW: 14.9 % (ref 11.5–15.5)
WBC: 6.3 10*3/uL (ref 4.0–10.5)
nRBC: 0 % (ref 0.0–0.2)

## 2022-12-25 LAB — COMPREHENSIVE METABOLIC PANEL
ALT: 74 U/L — ABNORMAL HIGH (ref 0–44)
AST: 72 U/L — ABNORMAL HIGH (ref 15–41)
Albumin: 2.8 g/dL — ABNORMAL LOW (ref 3.5–5.0)
Alkaline Phosphatase: 56 U/L (ref 38–126)
Anion gap: 6 (ref 5–15)
BUN: 9 mg/dL (ref 6–20)
CO2: 23 mmol/L (ref 22–32)
Calcium: 8.4 mg/dL — ABNORMAL LOW (ref 8.9–10.3)
Chloride: 104 mmol/L (ref 98–111)
Creatinine, Ser: 0.88 mg/dL (ref 0.61–1.24)
GFR, Estimated: >60 mL/min (ref >60)
Glucose, Bld: 114 mg/dL — ABNORMAL HIGH (ref 70–99)
Potassium: 3.8 mmol/L (ref 3.5–5.1)
Sodium: 133 mmol/L — ABNORMAL LOW (ref 135–145)
Total Bilirubin: 0.8 mg/dL (ref 0.3–1.2)
Total Protein: 5.6 g/dL — ABNORMAL LOW (ref 6.5–8.1)

## 2022-12-25 MED ORDER — FERROUS SULFATE 325 (65 FE) MG PO TABS
325.0000 mg | ORAL_TABLET | ORAL | 0 refills | Status: DC
  Filled 2022-12-25: qty 30, 60d supply, fill #0

## 2022-12-25 NOTE — Assessment & Plan Note (Addendum)
Now resolved. Patient has had multiple normal BM without difficulty or signs of bleeding.  - Continue Miralax and senna bowel regimen - CTM

## 2022-12-25 NOTE — Progress Notes (Signed)
Daily Progress Note Intern Pager: [phone]  [name] Medical record number: [id number] Date of birth: [birth year] Age: 57 y.o. Gender: male  Primary Care Provider: Bess Kinds, MD Consultants: CCM, Interventional Radiology  Code Status: Full  Pt Overview and Major Events to Date:  9/23: Admitted to ICU for submassive PE with right heart strain  9/23: Pulmonary arteriography thrombolysis, catheter directed 9/26: Transition from heparin gtt to DOAC  Assessment and Plan:  Jeffrey [name] is a 57yo M with hx of PE, DVT (on eliquis), HTN, HLD transferring from ICU following mechanical thrombolysis of submassive PE. Patient is symptomatically improving at this time, with healing hematoma at R groin puncture site. Now transitioned from heparin gtt to oral DOAC.  Medically stable at this time for discharge home. Assessment & Plan PE (pulmonary thromboembolism) (HCC) Patient presented with submassive PE with right heart strain s/p targeted thrombolysis with symptomatic improvement.  Prior to hospitalization, patient was not taking his Eliquis as directed.  Patient directed to take Eliquis 5 twice daily, but was instead taking both doses at once.  He was otherwise taking medications as directed. Patient transferred from ICU to FMTS morning of 9/25. BNP normalized to 59.5. Patient on heparin drip and satting well on room air. IR following. -Day 2/4 loading Eliquis today  - Continue loading dose Eliquis 10 BID x 4 day course (s/p 3 days heparin gtt)  - Indefinite maintenance Eliquis 5 BID to follow  - Follow-up IR recs - Albuterol nebs q4 prn. O2 as needed.  - PT/OT eval and assess  - PT recs: patient without further therapy needs - Outpatient pulm fu Hematoma Healing hematoma surrounding peripheral thrombolysis catheter insertion site in the right groin.  Large hematoma of left antecubital region, likely from numerous unsuccessful IV attempts. No signs of infection  or other complications at this time for either hematoma.  Improving hemoglobin at this time to 9.6. - Puncture site care for right groin hematoma - CTM Anemia On arrival patient's hemoglobin 15.3.  Throughout admission hemoglobin gradually decreased to 9.2 at the lowest, likely secondary to bleeding to the hematoma areas as above vs occult GI bleed.  No other active signs of bleeding. On day of discharge patient's hemoglobin up to 9.6.   Constipation Now resolved. Patient has had multiple normal BM without difficulty or signs of bleeding.  - Continue Miralax and senna bowel regimen - CTM Lactic acidosis Patient with downtrending mild lactic acidosis, now resolved 3.9 > 2.6 > 2.2 > 1.1. Elevated liver enzymes Patient with stable, mildly elevated transaminases. ALT 56 > 62, AST 53 > 58. No abdominal symptoms at this time.  - CTM  Chronic and Stable Issues: HTN: Continue home Olmesartan 40, holding home amlodipine 5, hydrochlorothiazide 25 HLD: Continue home atorvastatin 40  Mood: Continue home duloxetine 30 SDOH: Social work consulted regarding medication costs/insurance/FSA spending.   FEN/GI: Regular diet PPx: loading dose Eliquis 10 BID x 4 days Dispo: Ready for discharge today  Subjective:  Patient is feeling better this morning and ready to go home.  Breathing and coughing continue to improve.  He notes the increase Ellipta was really helpful.  No sharp chest pain, headache, or vision changes.  Eating and drinking well, no nausea vomiting or diarrhea.  Upon exam patient notes a large bruise in his left arm that he believes started after multiple (5) unsuccessful IV attempts 1 or 2 days ago. He feels like this bruise has been healing well  and getting smaller.  It is not painful.  Objective: Temp:  [98.5 F (36.9 C)-98.7 F (37.1 C)] 98.6 F (37 C) (09/27 0823) Pulse Rate:  [71-78] 78 (09/27 0830) Resp:  [16-19] 16 (09/27 0830) BP: (134-152)/(77-95) 149/91 (09/27 0823) SpO2:  [96  %-100 %] 96 % (09/27 0830) General: Well-appearing, no acute distress.  Sleeping, easily arousable. Cardiovascular: Normal S1/S2.  No extra heart sounds.  Warm and well-perfused. Respiratory: Breathing comfortably on room air.  Some crackles in right lower lobe, improved aeration throughout.  No increased work of breathing. Abdomen: Soft, nontender, nondistended. Extremities: Well-healing hematoma at R groin catheter site without erythema, induration, or drainage.  Large, healing hematoma in the left antecubital region with a 2-3 cm soft, mobile mass superior to the fossa, no pain to palpation.        Laboratory: Most recent CBC Lab Results  Component Value Date   WBC 6.3 12/25/2022   HGB 9.6 (L) 12/25/2022   HCT 27.8 (L) 12/25/2022   MCV 99.6 12/25/2022   PLT 133 (L) 12/25/2022   Most recent BMP    Latest Ref Rng & Units 12/25/2022    7:20 AM  BMP  Glucose 70 - 99 mg/dL 563   BUN 6 - 20 mg/dL 9   Creatinine 8.75 - 6.43 mg/dL 3.29   Sodium 518 - 841 mmol/L 133   Potassium 3.5 - 5.1 mmol/L 3.8   Chloride 98 - 111 mmol/L 104   CO2 22 - 32 mmol/L 23   Calcium 8.9 - 10.3 mg/dL 8.4    Ivery Quale, MD 12/25/2022, 9:30 AM  PGY-1, [facility] Family [facility] FPTS Intern pager: [phone], text pages welcome Secure chat group [facility] Teaching Service

## 2022-12-25 NOTE — Assessment & Plan Note (Addendum)
Patient with stable, mildly elevated transaminases. ALT 56 > 62, AST 53 > 58. No abdominal symptoms at this time.  - CTM

## 2022-12-25 NOTE — Assessment & Plan Note (Addendum)
Patient presented with submassive PE with right heart strain s/p targeted thrombolysis with symptomatic improvement.  Prior to hospitalization, patient was not taking his Eliquis as directed.  Patient directed to take Eliquis 5 twice daily, but was instead taking both doses at once.  He was otherwise taking medications as directed. Patient transferred from ICU to FMTS morning of 9/25. BNP normalized to 59.5. Patient on heparin drip and satting well on room air. IR following. -Day 2/4 loading Eliquis today  - Continue loading dose Eliquis 10 BID x 4 day course (s/p 3 days heparin gtt)  - Indefinite maintenance Eliquis 5 BID to follow  - Follow-up IR recs - Albuterol nebs q4 prn. O2 as needed.  - PT/OT eval and assess  - PT recs: patient without further therapy needs - Outpatient pulm fu

## 2022-12-25 NOTE — Assessment & Plan Note (Signed)
Patient with downtrending mild lactic acidosis, now resolved 3.9 > 2.6 > 2.2 > 1.1.

## 2022-12-25 NOTE — Assessment & Plan Note (Addendum)
Healing hematoma surrounding peripheral thrombolysis catheter insertion site in the right groin.  Large hematoma of left antecubital region, likely from numerous unsuccessful IV attempts. No signs of infection or other complications at this time for either hematoma.  Improving hemoglobin at this time to 9.6. - Puncture site care for right groin hematoma - CTM

## 2022-12-25 NOTE — Assessment & Plan Note (Addendum)
On arrival patient's hemoglobin 15.3.  Throughout admission hemoglobin gradually decreased to 9.2 at the lowest, likely secondary to bleeding to the hematoma areas as above vs occult GI bleed.  No other active signs of bleeding. On day of discharge patient's hemoglobin up to 9.6.

## 2023-01-01 ENCOUNTER — Encounter (HOSPITAL_COMMUNITY): Payer: Self-pay

## 2023-01-01 ENCOUNTER — Other Ambulatory Visit: Payer: Self-pay

## 2023-01-01 ENCOUNTER — Encounter: Payer: Self-pay | Admitting: Family Medicine

## 2023-01-01 ENCOUNTER — Ambulatory Visit (INDEPENDENT_AMBULATORY_CARE_PROVIDER_SITE_OTHER): Payer: Commercial Managed Care - PPO | Admitting: Family Medicine

## 2023-01-01 ENCOUNTER — Emergency Department (HOSPITAL_COMMUNITY)
Admission: EM | Admit: 2023-01-01 | Discharge: 2023-01-01 | Disposition: A | Discharge status: Home / Self Care | Payer: Commercial Managed Care - PPO | Attending: Emergency Medicine | Admitting: Emergency Medicine

## 2023-01-01 ENCOUNTER — Other Ambulatory Visit (HOSPITAL_COMMUNITY): Payer: Self-pay

## 2023-01-01 VITALS — BP 90/70 | HR 98 | Ht 76.0 in | Wt 238.8 lb

## 2023-01-01 DIAGNOSIS — F10129 Alcohol abuse with intoxication, unspecified: Secondary | ICD-10-CM | POA: Diagnosis not present

## 2023-01-01 DIAGNOSIS — Y907 Blood alcohol level of 200-239 mg/100 ml: Secondary | ICD-10-CM | POA: Diagnosis not present

## 2023-01-01 DIAGNOSIS — G8929 Other chronic pain: Secondary | ICD-10-CM | POA: Diagnosis not present

## 2023-01-01 DIAGNOSIS — M545 Low back pain, unspecified: Secondary | ICD-10-CM

## 2023-01-01 DIAGNOSIS — Z09 Encounter for follow-up examination after completed treatment for conditions other than malignant neoplasm: Secondary | ICD-10-CM | POA: Diagnosis not present

## 2023-01-01 DIAGNOSIS — E860 Dehydration: Secondary | ICD-10-CM | POA: Diagnosis not present

## 2023-01-01 DIAGNOSIS — R031 Nonspecific low blood-pressure reading: Secondary | ICD-10-CM | POA: Diagnosis not present

## 2023-01-01 DIAGNOSIS — F10920 Alcohol use, unspecified with intoxication, uncomplicated: Secondary | ICD-10-CM | POA: Diagnosis not present

## 2023-01-01 DIAGNOSIS — R55 Syncope and collapse: Secondary | ICD-10-CM | POA: Diagnosis not present

## 2023-01-01 DIAGNOSIS — I959 Hypotension, unspecified: Secondary | ICD-10-CM | POA: Diagnosis not present

## 2023-01-01 DIAGNOSIS — I2699 Other pulmonary embolism without acute cor pulmonale: Secondary | ICD-10-CM | POA: Diagnosis not present

## 2023-01-01 DIAGNOSIS — R42 Dizziness and giddiness: Secondary | ICD-10-CM | POA: Diagnosis present

## 2023-01-01 LAB — URINALYSIS, ROUTINE W REFLEX MICROSCOPIC
Bilirubin Urine: NEGATIVE
Glucose, UA: NEGATIVE mg/dL
Hgb urine dipstick: NEGATIVE
Ketones, ur: NEGATIVE mg/dL
Leukocytes,Ua: NEGATIVE
Nitrite: NEGATIVE
Protein, ur: NEGATIVE mg/dL
Specific Gravity, Urine: 1.005 (ref 1.005–1.030)
pH: 5 (ref 5.0–8.0)

## 2023-01-01 LAB — CBC WITH DIFFERENTIAL/PLATELET
Abs Immature Granulocytes: 0.05 10*3/uL (ref 0.00–0.07)
Basophils Absolute: 0.1 10*3/uL (ref 0.0–0.1)
Basophils Relative: 1 %
Eosinophils Absolute: 0.3 10*3/uL (ref 0.0–0.5)
Eosinophils Relative: 4 %
HCT: 38.2 % — ABNORMAL LOW (ref 39.0–52.0)
Hemoglobin: 12.6 g/dL — ABNORMAL LOW (ref 13.0–17.0)
Immature Granulocytes: 1 %
Lymphocytes Relative: 35 %
Lymphs Abs: 2.6 10*3/uL (ref 0.7–4.0)
MCH: 33.3 pg (ref 26.0–34.0)
MCHC: 33 g/dL (ref 30.0–36.0)
MCV: 101.1 fL — ABNORMAL HIGH (ref 80.0–100.0)
Monocytes Absolute: 0.6 10*3/uL (ref 0.1–1.0)
Monocytes Relative: 9 %
Neutro Abs: 3.8 10*3/uL (ref 1.7–7.7)
Neutrophils Relative %: 50 %
Platelets: 307 10*3/uL (ref 150–400)
RBC: 3.78 MIL/uL — ABNORMAL LOW (ref 4.22–5.81)
RDW: 15.8 % — ABNORMAL HIGH (ref 11.5–15.5)
WBC: 7.3 10*3/uL (ref 4.0–10.5)
nRBC: 0 % (ref 0.0–0.2)

## 2023-01-01 LAB — COMPREHENSIVE METABOLIC PANEL
ALT: 58 U/L — ABNORMAL HIGH (ref 0–44)
AST: 41 U/L (ref 15–41)
Albumin: 3.8 g/dL (ref 3.5–5.0)
Alkaline Phosphatase: 65 U/L (ref 38–126)
Anion gap: 13 (ref 5–15)
BUN: 18 mg/dL (ref 6–20)
CO2: 18 mmol/L — ABNORMAL LOW (ref 22–32)
Calcium: 8.9 mg/dL (ref 8.9–10.3)
Chloride: 102 mmol/L (ref 98–111)
Creatinine, Ser: 1.09 mg/dL (ref 0.61–1.24)
GFR, Estimated: >60 mL/min (ref >60)
Glucose, Bld: 90 mg/dL (ref 70–99)
Potassium: 3.7 mmol/L (ref 3.5–5.1)
Sodium: 133 mmol/L — ABNORMAL LOW (ref 135–145)
Total Bilirubin: 0.8 mg/dL (ref 0.3–1.2)
Total Protein: 7.4 g/dL (ref 6.5–8.1)

## 2023-01-01 LAB — ETHANOL: Alcohol, Ethyl (B): 219 mg/dL — ABNORMAL HIGH (ref <10)

## 2023-01-01 LAB — CBG MONITORING, ED: Glucose-Capillary: 99 mg/dL (ref 70–99)

## 2023-01-01 MED ORDER — DULOXETINE HCL 60 MG PO CPEP
60.0000 mg | ORAL_CAPSULE | Freq: Every day | ORAL | 3 refills | Status: AC
  Filled 2023-01-01: qty 30, 30d supply, fill #0

## 2023-01-01 MED ORDER — SODIUM CHLORIDE 0.9 % IV BOLUS: 1000.0000 mL | Freq: Once | INTRAVENOUS | Status: DC

## 2023-01-01 MED ORDER — SODIUM CHLORIDE 0.9 % IV BOLUS
1000.0000 mL | Freq: Once | INTRAVENOUS | Status: AC
  Administered 2023-01-01: 1000 mL via INTRAVENOUS

## 2023-01-01 NOTE — Discharge Instructions (Addendum)
As we discussed, your symptoms are likely from dehydration and also drinking too much alcohol.  Please stay hydrated and take your medicines as prescribed  See your doctor for follow-up  Return to ER if you have worse dizziness, passing out

## 2023-01-01 NOTE — ED Provider Notes (Signed)
[place] EMERGENCY DEPARTMENT AT [facility] Provider Note   CSN: [id number] Arrival date & time: 01/01/23  1538     History  Chief Complaint  Patient presents with   Loss of Consciousness    DARRELD HOFFER is a 57 y.o. male hx of recent PE on eliquis, here with dizziness.  Patient states that he had a hospital follow-up this morning.  He was told that he needs to be fasting for the blood work.  He did not eat anything since yesterday.  He states that he had his doctor appointment today.  Afterwards he went and drank some beer with her friend.  He states that afterwards he felt lightheaded dizzy.  He did not hit his head.  He felt like he was on pass out if he stands up.  The history is provided by the patient.       Home Medications Prior to Admission medications   Medication Sig Start Date End Date Taking? Authorizing Provider  apixaban (ELIQUIS) 5 MG TABS tablet Take 2 tablets twice a day for 4 more days, then 1 tablets twice a day afterwards 12/24/22   Lincoln Brigham, MD  atorvastatin (LIPITOR) 40 MG tablet TAKE 1 TABLET BY MOUTH ONCE DAILY 12/05/21 01/10/23  Bess Kinds, MD  DULoxetine (CYMBALTA) 60 MG capsule Take 1 capsule (60 mg total) by mouth daily. 01/01/23   Ivery Quale, MD  ferrous sulfate 325 (65 FE) MG tablet Take 1 tablet (325 mg total) by mouth every other day for 60 days then as directed by MD 12/25/22 12/25/23  Levin Erp, MD  NON FORMULARY Pt uses a cpap nighlty    [provider]  olmesartan (BENICAR) 40 MG tablet Take 1 tablet (40 mg total) by mouth at bedtime. 08/18/22   Sabino Dick, DO  Tiotropium Bromide Monohydrate (SPIRIVA RESPIMAT) 1.25 MCG/ACT AERS Inhale 2 puffs into the lungs daily. 12/24/22   Lincoln Brigham, MD      Allergies    Patient has no known allergies.    Review of Systems   Review of Systems  Neurological:  Positive for dizziness.  All other systems reviewed and are negative.   Physical Exam Updated  Vital Signs BP 109/80 (BP Location: Right Arm)   Pulse 76   Temp 97.7 F (36.5 C) (Oral)   Resp 16   SpO2 98%  Physical Exam Vitals and nursing note reviewed.  Constitutional:      Comments: Slightly dehydrated  HENT:     Head: Normocephalic.     Comments: No signs of head injury    Nose: Nose normal.     Mouth/Throat:     Mouth: Mucous membranes are dry.  Eyes:     Extraocular Movements: Extraocular movements intact.     Pupils: Pupils are equal, round, and reactive to light.  Cardiovascular:     Rate and Rhythm: Normal rate and regular rhythm.     Pulses: Normal pulses.     Heart sounds: Normal heart sounds.  Pulmonary:     Effort: Pulmonary effort is normal.     Breath sounds: Normal breath sounds.  Abdominal:     General: Abdomen is flat.     Palpations: Abdomen is soft.  Musculoskeletal:        General: Normal range of motion.     Cervical back: Normal range of motion and neck supple.  Skin:    General: Skin is warm.     Capillary Refill: Capillary refill takes less  than 2 seconds.  Neurological:     General: No focal deficit present.     Mental Status: He is oriented to person, place, and time.  Psychiatric:        Mood and Affect: Mood normal.        Behavior: Behavior normal.     ED Results / Procedures / Treatments   Labs (all labs ordered are listed, but only abnormal results are displayed) Labs Reviewed  URINALYSIS, ROUTINE W REFLEX MICROSCOPIC - Abnormal; Notable for the following components:      Result Value   Color, Urine STRAW (*)    All other components within normal limits  CBC WITH DIFFERENTIAL/PLATELET - Abnormal; Notable for the following components:   RBC 3.78 (*)    Hemoglobin 12.6 (*)    HCT 38.2 (*)    MCV 101.1 (*)    RDW 15.8 (*)    All other components within normal limits  COMPREHENSIVE METABOLIC PANEL  ETHANOL  AMMONIA  CBG MONITORING, ED    EKG EKG Interpretation Date/Time:  Friday January 01 2023 16:35:07  EDT Ventricular Rate:  78 PR Interval:  139 QRS Duration:  94 QT Interval:  418 QTC Calculation: 477 R Axis:   87  Text Interpretation: Sinus rhythm Abnormal T, consider ischemia, anterior leads No significant change since last tracing Confirmed by Richardean Canal [phone]) on 01/01/2023 4:38:25 PM  Radiology No results found.  Procedures Procedures    Medications Ordered in ED Medications  sodium chloride 0.9 % bolus 1,000 mL (has no administration in time range)    ED Course/ Medical Decision Making/ A&P                                 Medical Decision Making AFSHIN CHRYSTAL is a 57 y.o. male here with dizziness and lightheadedness.  Patient was fasting this morning for lab work and drink some alcohol.  I think this is likely causing his dizziness.  Will check CBC CMP and alcohol level.  Will hydrate patient and get orthostatics.  6:58 PM I reviewed patient's labs and patient's alcohol level is 200.  Patient was borderline hypotensive initially.  Given IV fluids and blood pressure is now 116/69.  He is feeling better now.  He contacted his wife who drove him home.  His symptoms likely from dehydration and alcohol use.  Problems Addressed: Alcoholic intoxication without complication (HCC): acute illness or injury Dehydration: acute illness or injury  Amount and/or Complexity of Data Reviewed Labs: ordered. Decision-making details documented in ED Course.    Final Clinical Impression(s) / ED Diagnoses Final diagnoses:  None    Rx / DC Orders ED Discharge Orders     None         Charlynne Pander, MD 01/01/23 1859

## 2023-01-01 NOTE — Progress Notes (Cosign Needed Addendum)
SUBJECTIVE:   CHIEF COMPLAINT / HPI:   PE  Hospital follow up: Patient presenting today for hospital follow up. Patient was recently hospitalized for subacute PE requiring targeted thrombolysis. Since discharging from the hospital, patient has been feeling better. No sharp chest pain or pain with breathing. He continues to have some productive cough with clear phlegm, no hemoptysis. Notably, he has had a large bruising of his R thigh since he returned home. No recent trauma or injury. The lateral thigh has been tender but not painful. He feels the bruises have been getting lighter over time, no changes in size. No other signs of bleeding, no hematuria or bloody stools/melena. Patient has been ambulating independently with his walker. He has been taking all of his medications as prescribed at hospital discharge, including Eliquis 5 BID.   Back pain Patient with chronic back pain s/p lumbar infusion in 12/2021. Patient was given Cymbalta at his clinic visit in July 2024 and feels few effects. His back continues to bother him and limit his mobility, and he feels like he mood is decreased as well from not being able to move around as much. No active SI/HI. Interested in physical therapy and talk therapy.   Hypotension During clinic visit today, patient had multiple low blood pressure readings: 92/73 > 84/70 > 90/70. Patient without new dizziness, lightheadedness, or confusion. He last took his amlodipine and olmesartan yesterday night, he last took his hydrochlorothiazde this morning.   Smoking cessation Patient has been thinking about quitting. May be interested in nicotine gum. Open to working with pharmacy as support during cessation process.   PERTINENT  PMH / PSH: PE and DVT (on Eliquis), HTN, Chronic pain, depression, suspected COPD (no LFTs)  OBJECTIVE:   BP 90/70   Pulse 98   Ht 6\' 4"  (1.93 m)   Wt 238 lb 12.8 oz (108.3 kg)   SpO2 99%   BMI 29.07 kg/m    General:  Well-appearing. Resting comfortably in room. CV: Soft S1/S2. No extra heart sounds. Warm and well-perfused. Pulm: Breathing comfortably on room air. CTAB. Some diminished aeration of bases bilaterally. No increased WOB. Abd: Soft, non-tender, non-distended. Skin:  Warm, dry. Large violaceous hematoma across lateral R thigh with some tenderness to palpation. Smaller but large violaceous hematoma across medial thigh, without increased tenderness to the area. Psych: Pleasant and appropriate.   ASSESSMENT/PLAN:   PE  Hospital follow up Patient appears to be stable from a respiratory standpoint. Large R thigh hematoma most likely consistent with bruising from catheter insertion that is now draining down tissue plane of leg. No other signs of bleeding at this time. Patient also had mildly elevated LFTs during hospital admission, will follow up today.  - Continue Eliquis 5 BID as prescribed - CTM bruise for worsening or lack of improvement - CMP to f/u LFTs  Back pain Chronic back pain that limits movement and has negative effect on mood.  - Increase Cymbalta to 60mg  daily - Discussed returning to previous dosage if patient develops worsening mood or suicidal thoughts - Placed referral for physical therapy - Provided talk therapy resources in AVS  Hypotension Patient with asymptomatic hypotension today. May be secondary to his multiple anti-hypertensive medications: last took amlodipine and olmesartan last pm, last took hydrochlorothiazide this morning. There may also be a component from known anemia, though asymptomatic today. Less likely due to active bleeding given stable hematoma and no other signs of bleeding, though patient is notably on Eliquis.  -  CBC today to monitor hemoglobin - D/c amlodipine and hydrochlorothiazide for now - Continue iron supplementation every other day  - Close follow up in 1 week  Smoking cessation -Scheduled patient to see pharmacist Dr [name] on 01/05/23 to  further discuss cessation support options.   Patient has been scheduled to return to clinic on 01/07/23 to follow up on BP and above. Consider need for pulmonary function testing at additional clinic visits.   Jeffrey Quale, MD [facility]

## 2023-01-01 NOTE — ED Triage Notes (Signed)
Pt had a of syncopal episode while waiting in a parking lot. Pt has been fasting overnight for upcoming blood work, and did admit to drinking a couple beers at apprx 1230. Denies any pain, does endorse feeling like he is going to pass out when standing up.

## 2023-01-01 NOTE — Patient Instructions (Addendum)
Thank you for visiting clinic today - it is always our pleasure to care for you.  - Today we discussed how you've been doing since coming home from the hospital. We're glad that you've been feeling better overall - we're sorry you experienced the large bruise. This may be bleeding from the procedure you got in the hospital that is now moving down along your leg tissue. This should continue to fade over time. If you notice it worsening, please let us know. Continue to take your Eliquis as prescribed.   - In clinic today, you had low blood pressure readings. We are checking some labs for you today following hospital discharge and will contact you with the results. Please stop taking your amlodipine and hydrochlorothiazide in the meantime. If you feel lightheaded, dizzy, confused, or have blurry vision, please seek immediate care.   - We increased your Cymbalta dosage to 60mg  daily. If you notice worsening mood symptoms, headaches, or confusion, please return to your previous dosage and let us know. We have also placed a referral for you to physical therapy - please let us know if they have not contacted you in the next 1-2 weeks. Please also see below for a list of therapy resources.   - For smoking cessation support, you are scheduled dot see our pharmacist, Dr [name] on Tuesday Oct 8th at 10:30AM.   You are scheduled to see your PCP Dr Barbaraann Faster on Thursday, Oct 10th at 11:10AM.   Reach out any time with any questions or concerns you may have - we are here for you!  Ivery Quale, MD [facility] [phone]   Therapy and Counseling Resources Most providers on this list will take Medicaid. Patients with commercial insurance or Medicare should contact their insurance company to get a list of in network providers.  [facility]:Psychiatry and Counseling [address] [place], [place] [address] [phone]  [facility]  [address] [place], [place] [address]       [phone]  [facility] [address]  [place], [place] [address] [phone]  [facility] [address], [address]  [place], [place] [address]       [phone]     [facility] (virtual only) [phone]  Jovita Kussmaul [facility] [address] [place], [place] [phone]  [facility]:  [address] [place] [phone]  [facility]:  [address] [place] [phone]  [facility] (under & uninsured) [address]   [place] [phone]    [email or web address]    [facility] [address]  [place]    [phone]  [facility] -[address]     Phone:  [phone]     [facility]-  [address]  [phone]   [facility] [address]      [place], [place] [phone]  [facility]: [address] [place], [place], [place]  [phone]   [facility] (Spanish therapist) [email or web address]  [address] [place]  [address]   [place] [address]    [phone]    [facility]   [address],  [place], [place]  [phone]   [facility]  [address] [place]  [phone]  [facility]  [address] [place], [place]        [phone]  Open Access/Walk In Clinic under & uninsured  [facility]  [address] [place], [place] [phone] Crisis [phone]  [facility] [place],  (Spanish)   [address], [place]: [phone]) 8:30 - 12; 1 - 2:30  [facility] [employer],  [address] [place], [place]    [phone]):8:30 - 12; 2 - 3PM  [facility],  [address]  [place]; [phone]):   Mon - Fri 8 AM - 5 PM  [facility] [address] [place]  MWF 12:30 to 3:00 or call to schedule an appointment  [phone]  Specific Provider options Psychology Today  [email or web address]/us click on find a therapist  enter your zip code left side and select or tailor a therapist for your specific need.   [facility] Provider Directory [email or web address]  (Medicaid)   Follow all drop down to find a provider  Social Support program [facility] [place] [phone] or [employer] [address], [place], [place] Recovery support and educational   24- Hour Availability:   [facility]  [address] [place], [place] [phone] Crisis [phone]  [facility] [phone]  [facility]  [phone]   [facility]  [phone] (after hours)  Therapeutic Alternative/Mobile Crisis   [phone]  [place] National Suicide Hotline  [phone] Len Childs)  Call 911 or go to emergency room  [facility]  [phone]);  [place] and [employer]  [phone]); [place], [place], [place], [place], [place], [place], [place]

## 2023-01-02 LAB — COMPREHENSIVE METABOLIC PANEL
ALT: 57 [IU]/L — ABNORMAL HIGH (ref 0–44)
AST: 44 [IU]/L — ABNORMAL HIGH (ref 0–40)
Albumin: 4.3 g/dL (ref 3.8–4.9)
Alkaline Phosphatase: 82 [IU]/L (ref 44–121)
BUN/Creatinine Ratio: 17 (ref 9–20)
BUN: 20 mg/dL (ref 6–24)
Bilirubin Total: 0.5 mg/dL (ref 0.0–1.2)
CO2: 19 mmol/L — ABNORMAL LOW (ref 20–29)
Calcium: 9.5 mg/dL (ref 8.7–10.2)
Chloride: 102 mmol/L (ref 96–106)
Creatinine, Ser: 1.16 mg/dL (ref 0.76–1.27)
Globulin, Total: 2.5 g/dL (ref 1.5–4.5)
Glucose: 86 mg/dL (ref 70–99)
Potassium: 4.3 mmol/L (ref 3.5–5.2)
Sodium: 134 mmol/L (ref 134–144)
Total Protein: 6.8 g/dL (ref 6.0–8.5)
eGFR: 73 mL/min/{1.73_m2} (ref >59)

## 2023-01-02 LAB — CBC
Hematocrit: 37.1 % — ABNORMAL LOW (ref 37.5–51.0)
Hemoglobin: 12.6 g/dL — ABNORMAL LOW (ref 13.0–17.7)
MCH: 32.6 pg (ref 26.6–33.0)
MCHC: 34 g/dL (ref 31.5–35.7)
MCV: 96 fL (ref 79–97)
Platelets: 292 10*3/uL (ref 150–450)
RBC: 3.87 x10E6/uL — ABNORMAL LOW (ref 4.14–5.80)
RDW: 14.5 % (ref 11.6–15.4)
WBC: 6.2 10*3/uL (ref 3.4–10.8)

## 2023-01-04 ENCOUNTER — Other Ambulatory Visit (HOSPITAL_COMMUNITY): Payer: Self-pay

## 2023-01-07 ENCOUNTER — Encounter: Payer: Self-pay | Admitting: Student

## 2023-01-07 ENCOUNTER — Other Ambulatory Visit: Payer: Self-pay

## 2023-01-07 ENCOUNTER — Ambulatory Visit (INDEPENDENT_AMBULATORY_CARE_PROVIDER_SITE_OTHER): Payer: Commercial Managed Care - PPO | Admitting: Student

## 2023-01-07 VITALS — BP 122/87 | HR 73 | Ht 76.0 in | Wt 243.0 lb

## 2023-01-07 DIAGNOSIS — G479 Sleep disorder, unspecified: Secondary | ICD-10-CM

## 2023-01-07 DIAGNOSIS — R748 Abnormal levels of other serum enzymes: Secondary | ICD-10-CM

## 2023-01-07 DIAGNOSIS — M545 Low back pain, unspecified: Secondary | ICD-10-CM | POA: Diagnosis not present

## 2023-01-07 NOTE — Patient Instructions (Addendum)
It was great to see you! Thank you for allowing me to participate in your care!  I recommend that you always bring your medications to each appointment as this makes it easy to ensure we are on the correct medications and helps [place] not miss when refills are needed.  Our plans for today:  - Back Pain Use Over the Counter, available in most pharmacies, Lidocaine Patches and Volatren gel. Use the Patches for 12 hours at a time, and use the gel when not wearing the patch.   Try for 1-2 weeks to see how it helps      - Elevated Liver Enzyme Checking a lab to see if your liver enzyme is still elevated. This is a marker of liver damage  - Poor Sleep We are going to try to get you better sleep with your CPAP. If your sleep is still poor quality with the CPAP, we may consider starting a medication at that time.  Contact the company that provide's your CPAP and ask about (nasal pillow), it's a mask that fits in your nose only.    We are checking some labs today, I will call you if they are abnormal will send you a MyChart message or a letter if they are normal.  If you do not hear about your labs in the next 2 weeks please let us know.  Take care and seek immediate care sooner if you develop any concerns.   Dr. Bess Kinds, MD [facility]    Call to schedule your physical therapy appointment for your back pain: [facility] at [facility] Address: [address] [place],  [place], [place] [address] Phone: [phone]  DME: [facility] Phone: [phone], press option 1 Fax: [phone]

## 2023-01-07 NOTE — Progress Notes (Signed)
  SUBJECTIVE:   CHIEF COMPLAINT / HPI:   F/u Low BP -Pt seen 10/4 noted to have low BP > D/c hydrochlorothiazide & amlodipine Meds: Olmesartan 40  Transaminitis Transiently elevated ALT since 9/23 (105), last noted 10/4 (58). Open to rechecking.  Recently seen in the ED, after drinking, for syncopal episode, thought to be secondary to elevated alcohol level.  Back Pain -Increased dose of Cymbalta to 60 mg at last visit. Today taking cymbalta regularly and w/o any effect. Has tried gabapentin w/o success. Hasn't seen PT, missed their call and needs to call back.  Does not want opioids for back pain.  Sleep: Getting 3-4 hours of sleep a night. Goes to sleep okay, but wakes up and stays awake. Sleeps with TV on for white noise. Has tried trazodone in the past w/o success. Has OSA but doesn't wear CPAP. Had hard time using maching for the mask. Needs a better mask.     PERTINENT  PMH / PSH:    OBJECTIVE:  BP 122/87   Pulse 73   Ht 6\' 4"  (1.93 m)   Wt 243 lb (110.2 kg)   SpO2 100%   BMI 29.58 kg/m  Physical Exam Constitutional:      General: He is not in acute distress.    Appearance: Normal appearance. He is not ill-appearing.  Cardiovascular:     Rate and Rhythm: Normal rate and regular rhythm.     Pulses: Normal pulses.     Heart sounds: Normal heart sounds. No murmur heard.    No friction rub. No gallop.  Pulmonary:     Effort: Pulmonary effort is normal. No respiratory distress.     Breath sounds: Normal breath sounds. No stridor. No wheezing, rhonchi or rales.  Skin:    Capillary Refill: Capillary refill takes less than 2 seconds.  Neurological:     Mental Status: He is alert.  Psychiatric:        Mood and Affect: Mood normal.        Behavior: Behavior normal.      ASSESSMENT/PLAN:  Elevated liver enzymes Assessment & Plan: Will recheck liver enzymes, as they have been trending down - CMP  Orders: -     Comprehensive metabolic panel  Lumbar back  pain Assessment & Plan: Patient continues to have back pain despite increased dose of Cymbalta.  Will try lidocaine patches as patient not complaining of neuropathic pain.  May consider Lyrica in the future.  Will also try Voltaren gel. - Salon pass, lidocaine patches, and Voltaren gel   Sleep disturbance Assessment & Plan: Patient notes poor quality sleep, getting 3 to 4 hours at night.  Patient notes he is able to fall asleep, but does not stay asleep.  Patient notes he has sleep apnea, but does not use his CPAP as his mass used to follow-up with his face.  Will recommend patient contact manufacture for new mask, recommended nasal cushions.  Also recommended patient use white noise machine and sleep with TV off to cut down on light pollution. - Contact manufacturer about CPAP mask - Wears CPAP machine - Sleep hygiene, cut off TV when sleeping - If patient continues to have sleep issues with improved sleep hygiene and CPAP, may consider sleep medicine    No follow-ups on file. Bess Kinds, MD 01/09/2023, 10:54 PM PGY-3, [facility]

## 2023-01-08 LAB — COMPREHENSIVE METABOLIC PANEL
ALT: 25 [IU]/L (ref 0–44)
AST: 25 [IU]/L (ref 0–40)
Albumin: 4.2 g/dL (ref 3.8–4.9)
Alkaline Phosphatase: 73 [IU]/L (ref 44–121)
BUN/Creatinine Ratio: 13 (ref 9–20)
BUN: 12 mg/dL (ref 6–24)
Bilirubin Total: 0.4 mg/dL (ref 0.0–1.2)
CO2: 20 mmol/L (ref 20–29)
Calcium: 9.1 mg/dL (ref 8.7–10.2)
Chloride: 101 mmol/L (ref 96–106)
Creatinine, Ser: 0.92 mg/dL (ref 0.76–1.27)
Globulin, Total: 2.7 g/dL (ref 1.5–4.5)
Glucose: 64 mg/dL — ABNORMAL LOW (ref 70–99)
Potassium: 4.3 mmol/L (ref 3.5–5.2)
Sodium: 137 mmol/L (ref 134–144)
Total Protein: 6.9 g/dL (ref 6.0–8.5)
eGFR: 96 mL/min/{1.73_m2} (ref >59)

## 2023-01-09 NOTE — Assessment & Plan Note (Signed)
Patient notes poor quality sleep, getting 3 to 4 hours at night.  Patient notes he is able to fall asleep, but does not stay asleep.  Patient notes he has sleep apnea, but does not use his CPAP as his mass used to follow-up with his face.  Will recommend patient contact manufacture for new mask, recommended nasal cushions.  Also recommended patient use white noise machine and sleep with TV off to cut down on light pollution. - Contact manufacturer about CPAP mask - Wears CPAP machine - Sleep hygiene, cut off TV when sleeping - If patient continues to have sleep issues with improved sleep hygiene and CPAP, may consider sleep medicine

## 2023-01-09 NOTE — Assessment & Plan Note (Signed)
Will recheck liver enzymes, as they have been trending down - CMP

## 2023-01-09 NOTE — Assessment & Plan Note (Signed)
Patient continues to have back pain despite increased dose of Cymbalta.  Will try lidocaine patches as patient not complaining of neuropathic pain.  May consider Lyrica in the future.  Will also try Voltaren gel. - Salon pass, lidocaine patches, and Voltaren gel

## 2023-01-11 ENCOUNTER — Encounter: Payer: Self-pay | Admitting: Student

## 2023-01-12 ENCOUNTER — Telehealth: Payer: Self-pay | Admitting: Internal Medicine

## 2023-01-12 ENCOUNTER — Encounter: Payer: Self-pay | Admitting: Internal Medicine

## 2023-01-12 ENCOUNTER — Ambulatory Visit (INDEPENDENT_AMBULATORY_CARE_PROVIDER_SITE_OTHER): Payer: Commercial Managed Care - PPO | Admitting: Internal Medicine

## 2023-01-12 VITALS — BP 128/78 | HR 65 | Temp 97.9°F | Ht 76.0 in | Wt 247.4 lb

## 2023-01-12 DIAGNOSIS — J439 Emphysema, unspecified: Secondary | ICD-10-CM

## 2023-01-12 DIAGNOSIS — Z87891 Personal history of nicotine dependence: Secondary | ICD-10-CM

## 2023-01-12 DIAGNOSIS — Z86711 Personal history of pulmonary embolism: Secondary | ICD-10-CM

## 2023-01-12 LAB — BRAIN NATRIURETIC PEPTIDE: Pro B Natriuretic peptide (BNP): 189 pg/mL — ABNORMAL HIGH (ref 0.0–100.0)

## 2023-01-12 LAB — D-DIMER, QUANTITATIVE: D-Dimer, Quant: 0.76 ug{FEU}/mL — ABNORMAL HIGH (ref <0.50)

## 2023-01-12 NOTE — Patient Instructions (Addendum)
ICD-10-CM   1. History of pulmonary embolus (PE)  Z86.711     2. History of cigarette smoking  Z87.891     3. History of emphysema (HCC)  J43.9      #Pulmonary embolism: This is a second blood clot.  The first 1 was in 2018 and the second 29 November 2022.  Glad you are better without any exertional shortness of breath or cough but this also means you or committing to anticoagulation lifelong  #Cigarette smoking and history of emphysema: No evidence of lung cancer reported on CT scan of the chest September 2024  Plan - Check blood work today for BNP and D-dimer; as a way of monitoring response with a blood clot -Do echocardiogram in 1 year -CT scan of the chest without contrast in 1 year -Do pulmonary function test in 1 year -Work on quitting smoking through the quit smoking cessation program - continue life long eliquis  Follow-up - Return in 1 year to see Dr. Marchelle Gearing or nurse practitioner but after CT scan, echo and PFT

## 2023-01-12 NOTE — Progress Notes (Signed)
IOV April 9[address] Dr Jamison Neighbor  Pulmonary Embolism with Acute Cor Pulmonale:  Found on CT angiogram 06/03/16. Evaluated in hospital by our service. Recommended systemic anticoagulation but no role for thrombolytic therapy. Patient previously was on Xarelto for a right lower extremity DVT but missed a few doses prior to presentation. Initially patient required 4 L/m by nasal cannula.  Pulmonary Emphysema: Apical predominate and likely due to tobacco use.  Left Upper Lobe Opacity: Seen on CT imaging of his chest. Likely due to lung infarction in the setting of pulmonary embolism.   Tobacco Use Disorder:      OCT 29562 Dr Marlyne Beards NESTOR  reoperative risk assessment: Patient planned for left knee replacement.  Acute pulmonary embolism: Referred to hematology at last appointment. Initially found on CT angiogram 06/03/16. No history of immobility. Likely due to missed doses of Xarelto in the setting of DVT. Hematology recommends indefinite anticoagulation with Eliquis. They also recommended bridging for any procedures utilizing Lovenox. Denies any dyspnea, coughing or wheezing.   Acute cor pulmonale: Likely secondary to pulmonary embolism. Resolved on repeat echocardiogram.  Right lower extremity DVT: DVT still present on venous duplex as noted below. Appears somewhat chronic in nature per vascular surgery interpretation.  Left upper lobe opacity: Resolved on repeat imaging. Likely secondary to infarction.  Pulmonary emphysema: Likely secondary to tobacco use. No evidence of alpha-1 antitrypsin deficiency. No evidence of COPD on pulmonary function testing.   Tobacco use disorder: At last appointment patient was smoking 5-6 cigarettes daily. He is down to 3 cigarettes daily. He plans to start patches eventually.     OV 01/12/2023  Subjective:  [name], male , DOB: 1965/06/27 , age 57 y.o. , MRN: [id number] , ADDRESS: [address] [place] [address] PCP Bess Kinds, MD Patient Care Team: Bess Kinds, MD as PCP - General (Family Medicine) Quintella Reichert, MD as PCP - Cardiology (Cardiology)  This Provider for this visit: Treatment Team:  Attending Provider: Kalman Shan, MD    01/12/2023 -   Chief Complaint  Patient presents with   Follow-up    Post hospital-doing well     HPI Jeffrey [name] 57 y.o. -admitted September 2024 for submassive pulmonary embolism with RV strain on echo and status post local thrombolytic through EKOS catheter.  He is currently doing well denies any shortness of breath.  He is limited by back pain and uses a lumbar brace and also a walker.  He is planning to get physical therapy for his low back pain but currently on anticoagulation with Eliquis.  He reverses 2018 blood clot and was on Xarelto for 6 months at that time.  He says that he ended up stopping it although it is not clear why he stopped it but he does think he stopped it prematurely.  He continues to smoke.  He knows he needs to quit.  He says he is cut down on smoking.  He does not want me to prescribe any medications.  He says he will work with the quit smoking program at [facility].  Denies any new issues.  No cough.  In the past emphysema has been reported on his medical problems but the latest CT chest in September 2024 did not show any emphysema he is not having any coughing or wheezing.  CTA 12/21/22   Narrative & Impression  CLINICAL DATA:  Pulmonary embolism suspected. Shortness of breath. Tachycardia.   EXAM: CT ANGIOGRAPHY CHEST WITH CONTRAST  TECHNIQUE: Multidetector CT imaging of the chest was performed using the standard protocol during bolus administration of intravenous contrast. Multiplanar CT image reconstructions and MIPs were obtained to evaluate the vascular anatomy.   RADIATION DOSE REDUCTION: This exam was performed according to the departmental dose-optimization program which includes automated exposure  control, adjustment of the mA and/or kV according to patient size and/or use of iterative reconstruction technique.   CONTRAST:  75mL OMNIPAQUE IOHEXOL 350 MG/ML SOLN   COMPARISON:  08/02/2022   FINDINGS: Cardiovascular: Heart size is normal. However, there is right ventricular strain with ratio of 2.05. There is massive bilateral pulmonary embolism. The aorta shows mild atherosclerotic change.   Mediastinum/Nodes: No mass or lymphadenopathy.   Lungs/Pleura: The lungs are clear.  No pleural effusion.   Upper Abdomen: Mild fatty change of the liver.   Musculoskeletal: Ordinary degenerative change of the spine.   Review of the MIP images confirms the above findings.   IMPRESSION: 1. Positive for acute PE with CT evidence of right heart strain (RV/LV Ratio = 2.05) consistent with at least submassive (intermediate risk) PE. The presence of right heart strain has been associated with an increased risk of morbidity and mortality. Please refer to the "Code PE Focused" order set in EPIC. 2. Critical Value/emergent results were called by telephone at the time of interpretation on 12/21/2022 at 6:55 am to provider [facility] , who verbally acknowledged these results.   Aortic Atherosclerosis (ICD10-I70.0).     Electronically Signed   By: Paulina Fusi M.D.   On: 12/21/2022 06:57    PFT     Latest Ref Rng & Units 12/22/2016    8:48 AM  ILD indicators  FVC-Pre L 3.99   FVC-Predicted Pre % 87   FVC-Post L 4.08   FVC-Predicted Post % 89   TLC L 6.36   TLC Predicted % 84   DLCO uncorrected ml/min/mmHg 26.67   DLCO UNC %Pred % 73   DLCO Corrected ml/min/mmHg 25.77   DLCO COR %Pred % 71    Immunization History  Administered Date(s) Administered   Influenza,inj,Quad PF,6+ Mos 05/04/2013, 06/05/2016, 12/31/2016, 01/02/2021   PFIZER(Purple Top)SARS-COV-2 Vaccination 10/26/2019, 11/17/2019   Pneumococcal Polysaccharide-23 06/05/2016   Tdap 12/31/2016      LAB RESULTS  last 96 hours No results found.  LAB RESULTS last 90 days Recent Results (from the past 2160 hour(s))  CBC with Differential     Status: Abnormal   Collection Time: 12/21/22  5:57 AM  Result Value Ref Range   WBC 11.4 (H) 4.0 - 10.5 K/uL   RBC 4.65 4.22 - 5.81 MIL/uL   Hemoglobin 15.3 13.0 - 17.0 g/dL   HCT 24.4 01.0 - 27.2 %   MCV 98.5 80.0 - 100.0 fL   MCH 32.9 26.0 - 34.0 pg   MCHC 33.4 30.0 - 36.0 g/dL   RDW 53.6 64.4 - 03.4 %   Platelets 120 (L) 150 - 400 K/uL    Comment: Immature Platelet Fraction may be clinically indicated, consider ordering this additional test [id number] REPEATED TO VERIFY    nRBC 0.0 0.0 - 0.2 %   Neutrophils Relative % 76 %   Neutro Abs 8.5 (H) 1.7 - 7.7 K/uL   Lymphocytes Relative 15 %   Lymphs Abs 1.7 0.7 - 4.0 K/uL   Monocytes Relative 8 %   Monocytes Absolute 1.0 0.1 - 1.0 K/uL   Eosinophils Relative 0 %   Eosinophils Absolute 0.1 0.0 - 0.5 K/uL   Basophils  Relative 0 %   Basophils Absolute 0.0 0.0 - 0.1 K/uL   Immature Granulocytes 1 %   Abs Immature Granulocytes 0.10 (H) 0.00 - 0.07 K/uL    Comment: Performed at [facility], [address] [place], [place] [address]  Comprehensive metabolic panel     Status: Abnormal   Collection Time: 12/21/22  5:57 AM  Result Value Ref Range   Sodium 133 (L) 135 - 145 mmol/L   Potassium 2.6 (LL) 3.5 - 5.1 mmol/L    Comment: CRITICAL RESULT CALLED TO, READ BACK BY AND VERIFIED WITH K.BRANCH,EMTP 0714 12/21/22 CLARK,S   Chloride 95 (L) 98 - 111 mmol/L   CO2 21 (L) 22 - 32 mmol/L   Glucose, Bld 316 (H) 70 - 99 mg/dL    Comment: Glucose reference range applies only to samples taken after fasting for at least 8 hours.   BUN 16 6 - 20 mg/dL   Creatinine, Ser 9.62 (H) 0.61 - 1.24 mg/dL   Calcium 8.7 (L) 8.9 - 10.3 mg/dL   Total Protein 6.9 6.5 - 8.1 g/dL   Albumin 3.5 3.5 - 5.0 g/dL   AST 952 (H) 15 - 41 U/L   ALT 105 (H) 0 - 44 U/L   Alkaline Phosphatase 101 38 - 126 U/L   Total Bilirubin 0.7  0.3 - 1.2 mg/dL   GFR, Estimated 50 (L) >60 mL/min    Comment: (NOTE) Calculated using the CKD-EPI Creatinine Equation (2021)    Anion gap 17 (H) 5 - 15    Comment: Performed at [facility], [address], [place], [place] [address]  Lipase, blood     Status: None   Collection Time: 12/21/22  5:57 AM  Result Value Ref Range   Lipase 50 11 - 51 U/L    Comment: Performed at [facility], [address] [place], [place] [address]  Troponin I (High Sensitivity)     Status: Abnormal   Collection Time: 12/21/22  5:57 AM  Result Value Ref Range   Troponin I (High Sensitivity) 1,011 (HH) <18 ng/L    Comment: CRITICAL RESULT CALLED TO, READ BACK BY AND VERIFIED WITH K.BRANCH,EMTP 2725 12/21/22 CLARK,S (NOTE) Elevated high sensitivity troponin I (hsTnI) values and significant  changes across serial measurements may suggest ACS but many other  chronic and acute conditions are known to elevate hsTnI results.  Refer to the "Links" section for chest pain algorithms and additional  guidance. Performed at [facility], [address], [place], [place] [address]   Brain natriuretic peptide     Status: Abnormal   Collection Time: 12/21/22  5:57 AM  Result Value Ref Range   B Natriuretic Peptide 110.3 (H) 0.0 - 100.0 pg/mL    Comment: Performed at [facility], [address], [place], [place] [address]  I-stat chem 8, ED (not at [facility], [facility] or [facility])     Status: Abnormal   Collection Time: 12/21/22  6:03 AM  Result Value Ref Range   Sodium 136 135 - 145 mmol/L   Potassium 2.6 (LL) 3.5 - 5.1 mmol/L   Chloride 97 (L) 98 - 111 mmol/L   BUN 17 6 - 20 mg/dL   Creatinine, Ser 2.59 (H) 0.61 - 1.24 mg/dL   Glucose, Bld 563 (H) 70 - 99 mg/dL    Comment: Glucose reference range applies only to samples taken after fasting for at least 8 hours.   Calcium, Ion 0.98 (L) 1.15 -  1.40 mmol/L   TCO2 18 (L) 22 - 32 mmol/L   Hemoglobin 16.7 13.0 - 17.0 g/dL   HCT 72.5 36.6 - 44.0 %    Comment NOTIFIED PHYSICIAN   Heparin level (unfractionated)     Status: None   Collection Time: 12/21/22  6:53 AM  Result Value Ref Range   Heparin Unfractionated 0.46 0.30 - 0.70 IU/mL    Comment: (NOTE) The clinical reportable range upper limit is being lowered to >1.10 to align with the FDA approved guidance for the current laboratory assay.  If heparin results are below expected values, and patient dosage has  been confirmed, suggest follow up testing of antithrombin III levels. Performed at [facility], [address], [place], [place] [address]   APTT     Status: Abnormal   Collection Time: 12/21/22  6:53 AM  Result Value Ref Range   aPTT 93 (H) 24 - 36 seconds    Comment:        IF BASELINE aPTT IS ELEVATED, SUGGEST PATIENT RISK ASSESSMENT BE USED TO DETERMINE APPROPRIATE ANTICOAGULANT THERAPY. Performed at [facility], [address] [place], [place] [address]   Troponin I (High Sensitivity)     Status: Abnormal   Collection Time: 12/21/22  8:13 AM  Result Value Ref Range   Troponin I (High Sensitivity) 1,057 (HH) <18 ng/L    Comment: CRITICAL VALUE NOTED. VALUE IS CONSISTENT WITH PREVIOUSLY REPORTED/CALLED VALUE (NOTE) Elevated high sensitivity troponin I (hsTnI) values and significant  changes across serial measurements may suggest ACS but many other  chronic and acute conditions are known to elevate hsTnI results.  Refer to the "Links" section for chest pain algorithms and additional  guidance. Performed at [facility], [address], [place], [place] [address]   Lactic acid, plasma     Status: Abnormal   Collection Time: 12/21/22  8:13 AM  Result Value Ref Range   Lactic Acid, Venous 3.9 (HH) 0.5 - 1.9 mmol/L    Comment: CRITICAL RESULT CALLED TO, READ BACK BY AND VERIFIED WITH K.BRANCH,EMTP 0901 12/21/22 CLARK,S Performed at [facility], [address] [place], [place] [address]   Hemoglobin A1c     Status: None   Collection  Time: 12/21/22  8:13 AM  Result Value Ref Range   Hgb A1c MFr Bld 5.2 4.8 - 5.6 %    Comment: (NOTE) Pre diabetes:          5.7%-6.4%  Diabetes:              >6.4%  Glycemic control for   <7.0% adults with diabetes    Mean Plasma Glucose 102.54 mg/dL    Comment: Performed at [facility], [address] [place], [place] [address]  HIV Antibody (routine testing w rflx)     Status: None   Collection Time: 12/21/22  8:13 AM  Result Value Ref Range   HIV Screen 4th Generation wRfx Non Reactive Non Reactive    Comment: Performed at [facility], [address], [place], [place] [address]  CBG monitoring, ED     Status: Abnormal   Collection Time: 12/21/22  8:35 AM  Result Value Ref Range   Glucose-Capillary 191 (H) 70 - 99 mg/dL    Comment: Glucose reference range applies only to samples taken after fasting for at least 8 hours.  ECHOCARDIOGRAM COMPLETE     Status: None   Collection Time: 12/21/22  9:08 AM  Result Value Ref Range   Weight 3,763.69 oz   Height 76 in   BP 108/89 mmHg   S' Lateral 1.60 cm   Area-P 1/2 3.54 cm2   Est EF 60 - 65%   Glucose, capillary     Status: Abnormal   Collection Time: 12/21/22 12:02 PM  Result Value Ref Range   Glucose-Capillary 141 (H) 70 - 99 mg/dL    Comment: Glucose reference range applies only to samples taken after fasting for at least 8 hours.  MRSA Next Gen by PCR, Nasal     Status: None   Collection Time: 12/21/22 12:04 PM   Specimen: Nasal Mucosa; Nasal Swab  Result Value Ref Range   MRSA by PCR Next Gen NOT DETECTED NOT DETECTED    Comment: (NOTE) The GeneXpert MRSA Assay (FDA approved for NASAL specimens only), is one component of a comprehensive MRSA colonization surveillance program. It is not intended to diagnose MRSA infection nor to guide or monitor treatment for MRSA infections. Test performance is not FDA approved in patients less than 65 years old. Performed at [facility], [address]  [place], [place] [address]   Lactic acid, plasma     Status: Abnormal   Collection Time: 12/21/22  1:51 PM  Result Value Ref Range   Lactic Acid, Venous 2.6 (HH) 0.5 - 1.9 mmol/L    Comment: CRITICAL VALUE NOTED. VALUE IS CONSISTENT WITH PREVIOUSLY REPORTED/CALLED VALUE Performed at [facility], [address], [place], [place] [address]   Heparin level (unfractionated)     Status: Abnormal   Collection Time: 12/21/22  1:51 PM  Result Value Ref Range   Heparin Unfractionated 0.23 (L) 0.30 - 0.70 IU/mL    Comment: (NOTE) The clinical reportable range upper limit is being lowered to >1.10 to align with the FDA approved guidance for the current laboratory assay.  If heparin results are below expected values, and patient dosage has  been confirmed, suggest follow up testing of antithrombin III levels. Performed at [facility], [address], [place], [place] [address]   CBC     Status: Abnormal   Collection Time: 12/21/22  1:51 PM  Result Value Ref Range   WBC 7.9 4.0 - 10.5 K/uL   RBC 4.22 4.22 - 5.81 MIL/uL   Hemoglobin 14.0 13.0 - 17.0 g/dL   HCT 69.6 29.5 - 28.4 %   MCV 95.7 80.0 - 100.0 fL   MCH 33.2 26.0 - 34.0 pg   MCHC 34.7 30.0 - 36.0 g/dL   RDW 13.2 44.0 - 10.2 %   Platelets 110 (L) 150 - 400 K/uL    Comment: REPEATED TO VERIFY   nRBC 0.0 0.0 - 0.2 %    Comment: Performed at [facility], [address] [place], [place] [address]  Fibrinogen     Status: None   Collection Time: 12/21/22  1:51 PM  Result Value Ref Range   Fibrinogen 378 210 - 475 mg/dL    Comment: (NOTE) Fibrinogen results may be underestimated in patients receiving thrombolytic therapy. Performed at [facility], [address], [place], [place] [address]   APTT     Status: Abnormal   Collection Time: 12/21/22  1:51 PM  Result Value Ref Range   aPTT 59 (H) 24 - 36 seconds    Comment:        IF BASELINE aPTT IS ELEVATED, SUGGEST PATIENT RISK ASSESSMENT BE USED TO  DETERMINE  APPROPRIATE ANTICOAGULANT THERAPY. Performed at [facility], [address] [place], [place] [address]   Basic metabolic panel     Status: Abnormal   Collection Time: 12/21/22  1:51 PM  Result Value Ref Range   Sodium 134 (L) 135 - 145 mmol/L   Potassium 3.4 (L) 3.5 - 5.1 mmol/L   Chloride 99 98 - 111 mmol/L   CO2 21 (L) 22 - 32 mmol/L   Glucose, Bld 190 (H) 70 - 99 mg/dL    Comment: Glucose reference range applies only to samples taken after fasting for at least 8 hours.   BUN 15 6 - 20 mg/dL   Creatinine, Ser 6.04 (H) 0.61 - 1.24 mg/dL   Calcium 8.4 (L) 8.9 - 10.3 mg/dL   GFR, Estimated >54 >09 mL/min    Comment: (NOTE) Calculated using the CKD-EPI Creatinine Equation (2021)    Anion gap 14 5 - 15    Comment: Performed at [facility], [address] [place], [place] [address]  Glucose, capillary     Status: Abnormal   Collection Time: 12/21/22  3:07 PM  Result Value Ref Range   Glucose-Capillary 162 (H) 70 - 99 mg/dL    Comment: Glucose reference range applies only to samples taken after fasting for at least 8 hours.  Glucose, capillary     Status: Abnormal   Collection Time: 12/21/22  7:21 PM  Result Value Ref Range   Glucose-Capillary 131 (H) 70 - 99 mg/dL    Comment: Glucose reference range applies only to samples taken after fasting for at least 8 hours.  CBC every 6 hours x 4 post-procedure     Status: Abnormal   Collection Time: 12/21/22 10:07 PM  Result Value Ref Range   WBC 8.6 4.0 - 10.5 K/uL   RBC 3.78 (L) 4.22 - 5.81 MIL/uL   Hemoglobin 12.2 (L) 13.0 - 17.0 g/dL   HCT 47.8 (L) 29.5 - 62.1 %   MCV 95.0 80.0 - 100.0 fL   MCH 32.3 26.0 - 34.0 pg   MCHC 34.0 30.0 - 36.0 g/dL   RDW 30.8 65.7 - 84.6 %   Platelets 116 (L) 150 - 400 K/uL    Comment: REPEATED TO VERIFY   nRBC 0.0 0.0 - 0.2 %    Comment: Performed at [facility], [address] [place], [place] [address]  Glucose, capillary     Status: None   Collection Time:  12/21/22 11:20 PM  Result Value Ref Range   Glucose-Capillary 77 70 - 99 mg/dL    Comment: Glucose reference range applies only to samples taken after fasting for at least 8 hours.  Fibrinogen     Status: None   Collection Time: 12/22/22 12:24 AM  Result Value Ref Range   Fibrinogen 341 210 - 475 mg/dL    Comment: (NOTE) Fibrinogen results may be underestimated in patients receiving thrombolytic therapy. Performed at [facility], [address], [place], [place] [address]   Heparin level (unfractionated)     Status: Abnormal   Collection Time: 12/22/22 12:24 AM  Result Value Ref Range   Heparin Unfractionated 0.73 (H) 0.30 - 0.70 IU/mL    Comment: (NOTE) The clinical reportable range upper limit is being lowered to >1.10 to align with the FDA approved guidance for the current laboratory assay.  If heparin results are below expected values, and patient dosage has  been confirmed, suggest follow up testing of antithrombin III levels. Performed  at [facility], [address] [place], [place] [address]   APTT     Status: Abnormal   Collection Time: 12/22/22 12:24 AM  Result Value Ref Range   aPTT 149 (H) 24 - 36 seconds    Comment:        IF BASELINE aPTT IS ELEVATED, SUGGEST PATIENT RISK ASSESSMENT BE USED TO DETERMINE APPROPRIATE ANTICOAGULANT THERAPY. Performed at [facility], [address], [place], [place] [address]   Glucose, capillary     Status: None   Collection Time: 12/22/22  3:50 AM  Result Value Ref Range   Glucose-Capillary 99 70 - 99 mg/dL    Comment: Glucose reference range applies only to samples taken after fasting for at least 8 hours.  CBC every 6 hours x 4 post-procedure     Status: Abnormal   Collection Time: 12/22/22  6:54 AM  Result Value Ref Range   WBC 8.7 4.0 - 10.5 K/uL   RBC 3.51 (L) 4.22 - 5.81 MIL/uL   Hemoglobin 11.7 (L) 13.0 - 17.0 g/dL   HCT 09.8 (L) 11.9 - 14.7 %   MCV 98.9 80.0 - 100.0 fL   MCH 33.3 26.0 - 34.0  pg   MCHC 33.7 30.0 - 36.0 g/dL   RDW 82.9 56.2 - 13.0 %   Platelets 108 (L) 150 - 400 K/uL    Comment: Immature Platelet Fraction may be clinically indicated, consider ordering this additional test [id number] REPEATED TO VERIFY    nRBC 0.0 0.0 - 0.2 %    Comment: Performed at [facility], [address] [place], [place] [address]  Fibrinogen every 6 hours x 4 post-procedure     Status: None   Collection Time: 12/22/22  6:54 AM  Result Value Ref Range   Fibrinogen 370 210 - 475 mg/dL    Comment: (NOTE) Fibrinogen results may be underestimated in patients receiving thrombolytic therapy. Performed at [facility], [address] [place], [place] [address]   APTT     Status: Abnormal   Collection Time: 12/22/22  6:54 AM  Result Value Ref Range   aPTT 103 (H) 24 - 36 seconds    Comment:        IF BASELINE aPTT IS ELEVATED, SUGGEST PATIENT RISK ASSESSMENT BE USED TO DETERMINE APPROPRIATE ANTICOAGULANT THERAPY. Performed at [facility], [address], [place], [place] [address]   Basic metabolic panel     Status: Abnormal   Collection Time: 12/22/22  6:54 AM  Result Value Ref Range   Sodium 132 (L) 135 - 145 mmol/L   Potassium 3.4 (L) 3.5 - 5.1 mmol/L   Chloride 99 98 - 111 mmol/L   CO2 23 22 - 32 mmol/L   Glucose, Bld 161 (H) 70 - 99 mg/dL    Comment: Glucose reference range applies only to samples taken after fasting for at least 8 hours.   BUN 13 6 - 20 mg/dL   Creatinine, Ser 2.72 0.61 - 1.24 mg/dL   Calcium 8.1 (L) 8.9 - 10.3 mg/dL   GFR, Estimated >53 >66 mL/min    Comment: (NOTE) Calculated using the CKD-EPI Creatinine Equation (2021)    Anion gap 10 5 - 15    Comment: Performed at [facility], [address] [place], [place] [address]  Magnesium     Status: None   Collection Time: 12/22/22  6:54 AM  Result Value Ref Range   Magnesium 2.1 1.7 -  2.4 mg/dL    Comment: Performed at [facility], [address], [place],  [place] [address]  Phosphorus     Status: Abnormal   Collection Time: 12/22/22  6:54 AM  Result Value Ref Range   Phosphorus 2.2 (L) 2.5 - 4.6 mg/dL    Comment: Performed at [facility], [address] [place], [place] [address]  Heparin level (unfractionated)     Status: None   Collection Time: 12/22/22  6:55 AM  Result Value Ref Range   Heparin Unfractionated 0.68 0.30 - 0.70 IU/mL    Comment: (NOTE) The clinical reportable range upper limit is being lowered to >1.10 to align with the FDA approved guidance for the current laboratory assay.  If heparin results are below expected values, and patient dosage has  been confirmed, suggest follow up testing of antithrombin III levels. Performed at [facility], [address] [place], [place] [address]   Glucose, capillary     Status: Abnormal   Collection Time: 12/22/22  8:07 AM  Result Value Ref Range   Glucose-Capillary 141 (H) 70 - 99 mg/dL    Comment: Glucose reference range applies only to samples taken after fasting for at least 8 hours.  CBC every 6 hours x 4 post-procedure     Status: Abnormal   Collection Time: 12/22/22  9:43 AM  Result Value Ref Range   WBC 9.2 4.0 - 10.5 K/uL   RBC 3.49 (L) 4.22 - 5.81 MIL/uL   Hemoglobin 11.5 (L) 13.0 - 17.0 g/dL   HCT 25.9 (L) 56.3 - 87.5 %   MCV 96.0 80.0 - 100.0 fL   MCH 33.0 26.0 - 34.0 pg   MCHC 34.3 30.0 - 36.0 g/dL   RDW 64.3 32.9 - 51.8 %   Platelets 114 (L) 150 - 400 K/uL    Comment: REPEATED TO VERIFY   nRBC 0.0 0.0 - 0.2 %    Comment: Performed at [facility], [address], [place], [place] [address]  Fibrinogen every 6 hours x 4 post-procedure     Status: None   Collection Time: 12/22/22  9:43 AM  Result Value Ref Range   Fibrinogen 381 210 - 475 mg/dL    Comment: (NOTE) Fibrinogen results may be underestimated in patients receiving thrombolytic therapy. Performed at [facility], [address], [place], [place] [address]   Glucose, capillary      Status: Abnormal   Collection Time: 12/22/22 11:42 AM  Result Value Ref Range   Glucose-Capillary 119 (H) 70 - 99 mg/dL    Comment: Glucose reference range applies only to samples taken after fasting for at least 8 hours.  CBC every 6 hours x 4 post-procedure     Status: Abnormal   Collection Time: 12/22/22  4:42 PM  Result Value Ref Range   WBC 8.6 4.0 - 10.5 K/uL   RBC 3.29 (L) 4.22 - 5.81 MIL/uL   Hemoglobin 11.1 (L) 13.0 - 17.0 g/dL   HCT 60.1 (L) 09.3 - 23.5 %   MCV 97.9 80.0 - 100.0 fL   MCH 33.7 26.0 - 34.0 pg   MCHC 34.5 30.0 - 36.0 g/dL   RDW 57.3 22.0 - 25.4 %   Platelets 112 (L) 150 - 400 K/uL    Comment: Immature Platelet Fraction may be clinically indicated, consider ordering this additional test [id number] REPEATED TO VERIFY    nRBC 0.0 0.0 - 0.2 %    Comment: Performed  at [facility], [address] [place], [place] [address]  Fibrinogen every 6 hours x 4 post-procedure     Status: None   Collection Time: 12/22/22  4:42 PM  Result Value Ref Range   Fibrinogen 388 210 - 475 mg/dL    Comment: (NOTE) Fibrinogen results may be underestimated in patients receiving thrombolytic therapy. Performed at [facility], [address] [place], [place] [address]   Heparin level (unfractionated)     Status: None   Collection Time: 12/23/22  8:08 AM  Result Value Ref Range   Heparin Unfractionated 0.30 0.30 - 0.70 IU/mL    Comment: (NOTE) The clinical reportable range upper limit is being lowered to >1.10 to align with the FDA approved guidance for the current laboratory assay.  If heparin results are below expected values, and patient dosage has  been confirmed, suggest follow up testing of antithrombin III levels. Performed at [facility], [address] [place], [place] [address]   Lactic acid, plasma     Status: Abnormal   Collection Time: 12/23/22  8:08 AM  Result Value Ref Range   Lactic Acid, Venous 2.2 (HH) 0.5 - 1.9 mmol/L     Comment: CRITICAL VALUE NOTED. VALUE IS CONSISTENT WITH PREVIOUSLY REPORTED/CALLED VALUE Performed at [facility], [address], [place], [place] [address]   Brain natriuretic peptide     Status: None   Collection Time: 12/23/22  8:08 AM  Result Value Ref Range   B Natriuretic Peptide 59.5 0.0 - 100.0 pg/mL    Comment: Performed at [facility], [address] [place], [place] [address]  Comprehensive metabolic panel     Status: Abnormal   Collection Time: 12/23/22  8:08 AM  Result Value Ref Range   Sodium 133 (L) 135 - 145 mmol/L   Potassium 3.5 3.5 - 5.1 mmol/L   Chloride 98 98 - 111 mmol/L   CO2 24 22 - 32 mmol/L   Glucose, Bld 101 (H) 70 - 99 mg/dL    Comment: Glucose reference range applies only to samples taken after fasting for at least 8 hours.   BUN 11 6 - 20 mg/dL   Creatinine, Ser 1.30 0.61 - 1.24 mg/dL   Calcium 8.1 (L) 8.9 - 10.3 mg/dL   Total Protein 5.5 (L) 6.5 - 8.1 g/dL   Albumin 2.7 (L) 3.5 - 5.0 g/dL   AST 53 (H) 15 - 41 U/L   ALT 56 (H) 0 - 44 U/L   Alkaline Phosphatase 58 38 - 126 U/L   Total Bilirubin 0.6 0.3 - 1.2 mg/dL   GFR, Estimated >86 >57 mL/min    Comment: (NOTE) Calculated using the CKD-EPI Creatinine Equation (2021)    Anion gap 11 5 - 15    Comment: Performed at [facility], [address], [place], [place] [address]  Magnesium     Status: None   Collection Time: 12/23/22  8:08 AM  Result Value Ref Range   Magnesium 2.0 1.7 - 2.4 mg/dL    Comment: Performed at [facility], [address], [place], [place] [address]  Phosphorus     Status: None   Collection Time: 12/23/22  8:08 AM  Result Value Ref Range   Phosphorus 2.8 2.5 - 4.6 mg/dL    Comment: Performed at [facility], [address] [place], [place] [address]  Heparin level (unfractionated)     Status: Abnormal  Collection Time: 12/24/22  7:22 AM  Result Value Ref Range   Heparin Unfractionated 0.28 (L) 0.30 - 0.70 IU/mL    Comment: (NOTE) The  clinical reportable range upper limit is being lowered to >1.10 to align with the FDA approved guidance for the current laboratory assay.  If heparin results are below expected values, and patient dosage has  been confirmed, suggest follow up testing of antithrombin III levels. Performed at [facility], [address], [place], [place] [address]   Lactic acid, plasma     Status: None   Collection Time: 12/24/22  7:22 AM  Result Value Ref Range   Lactic Acid, Venous 1.1 0.5 - 1.9 mmol/L    Comment: Performed at [facility], [address], [place], [place] [address]  CBC     Status: Abnormal   Collection Time: 12/24/22  7:22 AM  Result Value Ref Range   WBC 8.0 4.0 - 10.5 K/uL   RBC 2.90 (L) 4.22 - 5.81 MIL/uL   Hemoglobin 9.6 (L) 13.0 - 17.0 g/dL   HCT 09.8 (L) 11.9 - 14.7 %   MCV 97.2 80.0 - 100.0 fL   MCH 33.1 26.0 - 34.0 pg   MCHC 34.0 30.0 - 36.0 g/dL   RDW 82.9 56.2 - 13.0 %   Platelets 113 (L) 150 - 400 K/uL    Comment: REPEATED TO VERIFY   nRBC 0.0 0.0 - 0.2 %    Comment: Performed at [facility], [address], [place], [place] [address]  Comprehensive metabolic panel     Status: Abnormal   Collection Time: 12/24/22  7:22 AM  Result Value Ref Range   Sodium 131 (L) 135 - 145 mmol/L   Potassium 3.6 3.5 - 5.1 mmol/L   Chloride 102 98 - 111 mmol/L   CO2 24 22 - 32 mmol/L   Glucose, Bld 97 70 - 99 mg/dL    Comment: Glucose reference range applies only to samples taken after fasting for at least 8 hours.   BUN 8 6 - 20 mg/dL   Creatinine, Ser 4.69 0.61 - 1.24 mg/dL   Calcium 8.3 (L) 8.9 - 10.3 mg/dL   Total Protein 5.5 (L) 6.5 - 8.1 g/dL   Albumin 2.8 (L) 3.5 - 5.0 g/dL   AST 58 (H) 15 - 41 U/L   ALT 62 (H) 0 - 44 U/L   Alkaline Phosphatase 59 38 - 126 U/L   Total Bilirubin 0.6 0.3 - 1.2 mg/dL   GFR, Estimated >62 >95 mL/min    Comment: (NOTE) Calculated using the CKD-EPI Creatinine Equation (2021)    Anion gap 5 5 - 15    Comment:  Performed at [facility], [address], [place], [place] [address]  CBC     Status: Abnormal   Collection Time: 12/24/22  3:39 PM  Result Value Ref Range   WBC 6.7 4.0 - 10.5 K/uL   RBC 2.88 (L) 4.22 - 5.81 MIL/uL   Hemoglobin 9.2 (L) 13.0 - 17.0 g/dL   HCT 24.4 (L) 01.0 - 27.2 %   MCV 96.2 80.0 - 100.0 fL   MCH 31.9 26.0 - 34.0 pg   MCHC 33.2 30.0 - 36.0 g/dL   RDW 53.6 64.4 - 03.4 %   Platelets 117 (L) 150 - 400 K/uL    Comment: REPEATED TO VERIFY   nRBC 0.0 0.0 - 0.2 %    Comment: Performed at [facility], [address] [place], [place] [address]  CBC     Status: Abnormal   Collection Time: 12/25/22  7:20 AM  Result Value Ref Range   WBC 6.3 4.0 - 10.5 K/uL   RBC 2.79 (L) 4.22 - 5.81 MIL/uL   Hemoglobin 9.6 (L) 13.0 - 17.0 g/dL   HCT 19.1 (L) 47.8 - 29.5 %   MCV 99.6 80.0 - 100.0 fL   MCH 34.4 (H) 26.0 - 34.0 pg   MCHC 34.5 30.0 - 36.0 g/dL   RDW 62.1 30.8 - 65.7 %   Platelets 133 (L) 150 - 400 K/uL    Comment: CONSISTENT WITH PREVIOUS RESULT REPEATED TO VERIFY    nRBC 0.0 0.0 - 0.2 %    Comment: Performed at [facility], [address] [place], [place] [address]  Comprehensive metabolic panel     Status: Abnormal   Collection Time: 12/25/22  7:20 AM  Result Value Ref Range   Sodium 133 (L) 135 - 145 mmol/L   Potassium 3.8 3.5 - 5.1 mmol/L   Chloride 104 98 - 111 mmol/L   CO2 23 22 - 32 mmol/L   Glucose, Bld 114 (H) 70 - 99 mg/dL    Comment: Glucose reference range applies only to samples taken after fasting for at least 8 hours.   BUN 9 6 - 20 mg/dL   Creatinine, Ser 2.95 0.61 - 1.24 mg/dL   Calcium 8.4 (L) 8.9 - 10.3 mg/dL   Total Protein 5.6 (L) 6.5 - 8.1 g/dL   Albumin 2.8 (L) 3.5 - 5.0 g/dL   AST 72 (H) 15 - 41 U/L   ALT 74 (H) 0 - 44 U/L   Alkaline Phosphatase 56 38 - 126 U/L   Total Bilirubin 0.8 0.3 - 1.2 mg/dL   GFR, Estimated >28 >41 mL/min    Comment: (NOTE) Calculated using the CKD-EPI Creatinine Equation (2021)    Anion  gap 6 5 - 15    Comment: Performed at [facility], [address], [place], [place] [address]  CBC     Status: Abnormal   Collection Time: 01/01/23  3:25 PM  Result Value Ref Range   WBC 6.2 3.4 - 10.8 x10E3/uL   RBC 3.87 (L) 4.14 - 5.80 x10E6/uL   Hemoglobin 12.6 (L) 13.0 - 17.7 g/dL   Hematocrit 10.2 (L) 72.5 - 51.0 %   MCV 96 79 - 97 fL   MCH 32.6 26.6 - 33.0 pg   MCHC 34.0 31.5 - 35.7 g/dL   RDW 36.6 44.0 - 34.7 %   Platelets 292 150 - 450 x10E3/uL  Comprehensive metabolic panel     Status: Abnormal   Collection Time: 01/01/23  3:25 PM  Result Value Ref Range   Glucose 86 70 - 99 mg/dL   BUN 20 6 - 24 mg/dL   Creatinine, Ser 4.25 0.76 - 1.27 mg/dL   eGFR 73 >95 GL/OVF/6.43   BUN/Creatinine Ratio 17 9 - 20   Sodium 134 134 - 144 mmol/L   Potassium 4.3 3.5 - 5.2 mmol/L   Chloride 102 96 - 106 mmol/L   CO2 19 (L) 20 - 29 mmol/L   Calcium 9.5 8.7 - 10.2 mg/dL   Total Protein 6.8 6.0 - 8.5 g/dL   Albumin 4.3 3.8 - 4.9 g/dL   Globulin, Total 2.5 1.5 - 4.5 g/dL   Bilirubin Total 0.5 0.0 - 1.2 mg/dL   Alkaline Phosphatase 82 44 - 121 IU/L   AST 44 (  H) 0 - 40 IU/L   ALT 57 (H) 0 - 44 IU/L  CBG monitoring, ED     Status: None   Collection Time: 01/01/23  4:09 PM  Result Value Ref Range   Glucose-Capillary 99 70 - 99 mg/dL    Comment: Glucose reference range applies only to samples taken after fasting for at least 8 hours.  CBC with Differential     Status: Abnormal   Collection Time: 01/01/23  4:11 PM  Result Value Ref Range   WBC 7.3 4.0 - 10.5 K/uL   RBC 3.78 (L) 4.22 - 5.81 MIL/uL   Hemoglobin 12.6 (L) 13.0 - 17.0 g/dL   HCT 78.2 (L) 95.6 - 21.3 %   MCV 101.1 (H) 80.0 - 100.0 fL   MCH 33.3 26.0 - 34.0 pg   MCHC 33.0 30.0 - 36.0 g/dL   RDW 08.6 (H) 57.8 - 46.9 %   Platelets 307 150 - 400 K/uL   nRBC 0.0 0.0 - 0.2 %   Neutrophils Relative % 50 %   Neutro Abs 3.8 1.7 - 7.7 K/uL   Lymphocytes Relative 35 %   Lymphs Abs 2.6 0.7 - 4.0 K/uL   Monocytes Relative 9  %   Monocytes Absolute 0.6 0.1 - 1.0 K/uL   Eosinophils Relative 4 %   Eosinophils Absolute 0.3 0.0 - 0.5 K/uL   Basophils Relative 1 %   Basophils Absolute 0.1 0.0 - 0.1 K/uL   Immature Granulocytes 1 %   Abs Immature Granulocytes 0.05 0.00 - 0.07 K/uL    Comment: Performed at [facility], [address], [place], [place] [address]  Comprehensive metabolic panel     Status: Abnormal   Collection Time: 01/01/23  4:11 PM  Result Value Ref Range   Sodium 133 (L) 135 - 145 mmol/L   Potassium 3.7 3.5 - 5.1 mmol/L   Chloride 102 98 - 111 mmol/L   CO2 18 (L) 22 - 32 mmol/L   Glucose, Bld 90 70 - 99 mg/dL    Comment: Glucose reference range applies only to samples taken after fasting for at least 8 hours.   BUN 18 6 - 20 mg/dL   Creatinine, Ser 8.41 0.61 - 1.24 mg/dL   Calcium 8.9 8.9 - 32.4 mg/dL   Total Protein 7.4 6.5 - 8.1 g/dL   Albumin 3.8 3.5 - 5.0 g/dL   AST 41 15 - 41 U/L   ALT 58 (H) 0 - 44 U/L   Alkaline Phosphatase 65 38 - 126 U/L   Total Bilirubin 0.8 0.3 - 1.2 mg/dL   GFR, Estimated >40 >10 mL/min    Comment: (NOTE) Calculated using the CKD-EPI Creatinine Equation (2021)    Anion gap 13 5 - 15    Comment: Performed at [facility], [address] [place], [place] [address]  Ethanol     Status: Abnormal   Collection Time: 01/01/23  4:11 PM  Result Value Ref Range   Alcohol, Ethyl (B) 219 (H) <10 mg/dL    Comment: (NOTE) Lowest detectable limit for serum alcohol is 10 mg/dL.  For medical purposes only. Performed at [facility], [address] [place], [place] [address]   Urinalysis, Routine w reflex microscopic -Urine, Clean Catch     Status: Abnormal   Collection Time: 01/01/23  4:28 PM  Result Value Ref Range   Color, Urine STRAW (A) YELLOW   APPearance CLEAR CLEAR   Specific Gravity, Urine 1.005 1.005 -  1.030   pH 5.0 5.0 - 8.0   Glucose, UA NEGATIVE NEGATIVE mg/dL   Hgb urine dipstick NEGATIVE  NEGATIVE   Bilirubin Urine NEGATIVE NEGATIVE   Ketones, ur NEGATIVE NEGATIVE mg/dL   Protein, ur NEGATIVE NEGATIVE mg/dL   Nitrite NEGATIVE NEGATIVE   Leukocytes,Ua NEGATIVE NEGATIVE    Comment: Performed at [facility], [address] [place], [place] [address]  Comprehensive metabolic panel     Status: Abnormal   Collection Time: 01/07/23  4:47 PM  Result Value Ref Range   Glucose 64 (L) 70 - 99 mg/dL   BUN 12 6 - 24 mg/dL   Creatinine, Ser 6.04 0.76 - 1.27 mg/dL   eGFR 96 >54 UJ/WJX/9.14   BUN/Creatinine Ratio 13 9 - 20   Sodium 137 134 - 144 mmol/L   Potassium 4.3 3.5 - 5.2 mmol/L   Chloride 101 96 - 106 mmol/L   CO2 20 20 - 29 mmol/L   Calcium 9.1 8.7 - 10.2 mg/dL   Total Protein 6.9 6.0 - 8.5 g/dL   Albumin 4.2 3.8 - 4.9 g/dL   Globulin, Total 2.7 1.5 - 4.5 g/dL   Bilirubin Total 0.4 0.0 - 1.2 mg/dL   Alkaline Phosphatase 73 44 - 121 IU/L   AST 25 0 - 40 IU/L   ALT 25 0 - 44 IU/L         has a past medical history of Acute pulmonary embolus (HCC), Arthritis, DVT (deep venous thrombosis) (HCC) (11/28/2013), High cholesterol, Hypertension, Lumbar spine scoliosis, Marijuana use, and Sleep apnea.   reports that he has been smoking cigars. He has never used smokeless tobacco.  Past Surgical History:  Procedure Laterality Date   ABDOMINAL EXPOSURE N/A 01/14/2022   Procedure: ABDOMINAL EXPOSURE;  Surgeon: Cephus Shelling, MD;  Location: [facility] OR;  Service: Vascular;  Laterality: N/A;   ANTERIOR LUMBAR FUSION Left 01/14/2022   Procedure: Lumbar three-four Lumbar four-five Oblique Lumbar Interbody Fusion;  Surgeon: Jadene Pierini, MD;  Location: [facility] OR;  Service: Neurosurgery;  Laterality: Left;   HERNIA REPAIR     2005 and 2011; umbilical hernia repair   IR ANGIOGRAM PULMONARY BILATERAL SELECTIVE  12/21/2022   IR ANGIOGRAM SELECTIVE EACH ADDITIONAL VESSEL  12/21/2022   IR ANGIOGRAM SELECTIVE EACH ADDITIONAL VESSEL  12/21/2022   IR INFUSION  THROMBOL ARTERIAL INITIAL (MS)  12/21/2022   IR INFUSION THROMBOL ARTERIAL INITIAL (MS)  12/21/2022   IR THROMB F/U EVAL ART/VEN FINAL DAY (MS)  12/22/2022   IR US GUIDE VASC ACCESS RIGHT  12/21/2022   KNEE SURGERY Bilateral    Left knee 1993, right knee 2004   TONSILLECTOMY     TOTAL KNEE ARTHROPLASTY Left 02/10/2017   Procedure: LEFT TOTAL KNEE ARTHROPLASTY;  Surgeon: Tarry Kos, MD;  Location: [facility] OR;  Service: Orthopedics;  Laterality: Left;   TOTAL KNEE ARTHROPLASTY Right 07/28/2017   Procedure: RIGHT TOTAL KNEE ARTHROPLASTY;  Surgeon: Tarry Kos, MD;  Location: [facility] OR;  Service: Orthopedics;  Laterality: Right;   TRANSFORAMINAL LUMBAR INTERBODY FUSION W/ MIS 1 LEVEL N/A 01/14/2022   Procedure: OPEN TRANSFORAMINAL LUMBAR INTERBODY FUSION  LUMBAR FIVE-SACRAL ONE ,LAMINECTOMIES AND POSTERIOR LATERAL INSTRUMENTATION FUSION  LUMBAR THREE TO FIVE SACRAL ONE;  Surgeon: Jadene Pierini, MD;  Location: [facility] OR;  Service: Neurosurgery;  Laterality: N/A;    No Known Allergies  Immunization History  Administered Date(s) Administered   Influenza,inj,Quad PF,6+ Mos 05/04/2013, 06/05/2016, 12/31/2016, 01/02/2021   PFIZER(Purple Top)SARS-COV-2 Vaccination 10/26/2019, 11/17/2019  Pneumococcal Polysaccharide-23 06/05/2016   Tdap 12/31/2016    Family History  Problem Relation Age of Onset   Heart disease Other    Arthritis Other    Alcohol abuse Mother    Heart disease Mother    Depression Mother    Hypertension Mother    Kidney disease Mother    Arthritis Mother    Clotting disorder Mother    Arthritis Father    Alcohol abuse Brother    Drug abuse Brother    Sickle cell anemia Brother    Arthritis Maternal Grandmother    Heart disease Maternal Grandmother    Arthritis Maternal Grandfather    Heart disease Maternal Grandfather    Asthma Cousin      Current Outpatient Medications:    apixaban (ELIQUIS) 5 MG TABS tablet, Take 2 tablets twice a day for 4 more days, then 1 tablets  twice a day afterwards, Disp: 60 tablet, Rfl: 0   atorvastatin (LIPITOR) 40 MG tablet, TAKE 1 TABLET BY MOUTH ONCE DAILY, Disp: 90 tablet, Rfl: 2   DULoxetine (CYMBALTA) 60 MG capsule, Take 1 capsule (60 mg total) by mouth daily., Disp: 30 capsule, Rfl: 3   ferrous sulfate 325 (65 FE) MG tablet, Take 1 tablet (325 mg total) by mouth every other day for 60 days then as directed by MD, Disp: 30 tablet, Rfl: 0   NON FORMULARY, Pt uses a cpap nighlty, Disp: , Rfl:    olmesartan (BENICAR) 40 MG tablet, Take 1 tablet (40 mg total) by mouth at bedtime., Disp: 90 tablet, Rfl: 3   Tiotropium Bromide Monohydrate (SPIRIVA RESPIMAT) 1.25 MCG/ACT AERS, Inhale 2 puffs into the lungs daily., Disp: 4 g, Rfl: 0      Objective:   Vitals:   01/12/23 0950  BP: 128/78  Pulse: 65  Temp: 97.9 F (36.6 C)  TempSrc: Temporal  SpO2: 96%  Weight: 247 lb 6.4 oz (112.2 kg)  Height: 6\' 4"  (1.93 m)    Estimated body mass index is 30.11 kg/m as calculated from the following:   Height as of this encounter: 6\' 4"  (1.93 m).   Weight as of this encounter: 247 lb 6.4 oz (112.2 kg).  @WEIGHTCHANGE @  [employer]   01/12/23 0950  Weight: 247 lb 6.4 oz (112.2 kg)     Physical Exam   General: No distress. Looks well O2 at rest: no Cane present: no Sitting in wheel chair: no. BUT HAS WALKER Frail: no Obese: no Neuro: Alert and Oriented x 3. GCS 15. Speech normal Psych: Pleasant Resp:  Barrel Chest - no.  Wheeze - no, Crackles - no, No overt respiratory distress CVS: Normal heart sounds. Murmurs - no Ext: Stigmata of Connective Tissue Disease - no HEENT: Normal upper airway. PEERL +. No post nasal drip        Assessment:       ICD-10-CM   1. History of pulmonary embolus (PE)  Z86.711     2. History of cigarette smoking  Z87.891     3. History of emphysema (HCC)  J43.9          Plan:     Patient Instructions     ICD-10-CM   1. History of pulmonary embolus (PE)  Z86.711     2. History  of cigarette smoking  Z87.891     3. History of emphysema (HCC)  J43.9      #Pulmonary embolism: This is a second blood clot.  The first 1 was in 2018  and the second 29 November 2022.  Glad you are better without any exertional shortness of breath or cough but this also means you or committing to anticoagulation lifelong  #Cigarette smoking and history of emphysema: No evidence of lung cancer reported on CT scan of the chest September 2024  Plan - Check blood work today for BNP and D-dimer; as a way of monitoring response with a blood clot -Do echocardiogram in 1 year -CT scan of the chest without contrast in 1 year -Do pulmonary function test in 1 year -Work on quitting smoking through the quit smoking cessation program - continue life long eliquis  Follow-up - Return in 1 year to see Dr. Marchelle Gearing or nurse practitioner but after CT scan, echo and PFT   FOLLOWUP Return in about 1 year (around 01/12/2024) for 15 min visit, with Dr Marchelle Gearing, Face to Face OR Video Visit.    SIGNATURE    Dr. Kalman Shan, M.D., F.C.C.P,  Pulmonary and Critical Care Medicine Staff Physician, [facility] Director - Interstitial Lung Disease  Program  Pulmonary Fibrosis [facility] at [facility] [place], [place], [address]  Pager: [phone], If no answer or between  15:00h - 7:00h: call [phone] Telephone: [phone]  10:09 AM 01/12/2023

## 2023-01-12 NOTE — Telephone Encounter (Signed)
Bnp high and d0dimer better but still high  Plan  - get echo in 1 month  (hx of PE)

## 2023-01-18 ENCOUNTER — Encounter: Payer: Self-pay | Admitting: Student

## 2023-01-19 ENCOUNTER — Other Ambulatory Visit: Payer: Self-pay | Admitting: Family Medicine

## 2023-01-19 ENCOUNTER — Other Ambulatory Visit (HOSPITAL_COMMUNITY): Payer: Self-pay

## 2023-01-19 ENCOUNTER — Other Ambulatory Visit: Payer: Self-pay | Admitting: Student

## 2023-01-20 ENCOUNTER — Other Ambulatory Visit: Payer: Self-pay

## 2023-01-20 ENCOUNTER — Other Ambulatory Visit (HOSPITAL_COMMUNITY): Payer: Self-pay

## 2023-01-20 MED ORDER — ATORVASTATIN CALCIUM 40 MG PO TABS
40.0000 mg | ORAL_TABLET | Freq: Every day | ORAL | 2 refills | Status: DC
  Filled 2023-01-20: qty 90, 90d supply, fill #0
  Filled 2023-05-17: qty 90, 90d supply, fill #1
  Filled 2023-09-17: qty 90, 90d supply, fill #2

## 2023-01-20 MED ORDER — APIXABAN 5 MG PO TABS
ORAL_TABLET | ORAL | 4 refills | Status: DC
  Filled 2023-01-20: qty 60, fill #0
  Filled 2023-02-05: qty 60, 30d supply, fill #0
  Filled 2023-02-05: qty 60, 26d supply, fill #0
  Filled 2023-04-26: qty 60, 30d supply, fill #0
  Filled 2023-05-17: qty 60, 30d supply, fill #1
  Filled 2023-07-27: qty 60, 30d supply, fill #2
  Filled 2023-09-17: qty 60, 30d supply, fill #3
  Filled 2023-11-11: qty 60, 30d supply, fill #4

## 2023-01-21 ENCOUNTER — Other Ambulatory Visit (HOSPITAL_COMMUNITY): Payer: Self-pay

## 2023-01-28 ENCOUNTER — Ambulatory Visit: Payer: Commercial Managed Care - PPO | Admitting: Physical Therapy

## 2023-01-29 ENCOUNTER — Encounter (HOSPITAL_COMMUNITY): Payer: Self-pay | Admitting: Emergency Medicine

## 2023-01-29 ENCOUNTER — Emergency Department (HOSPITAL_COMMUNITY): Payer: Commercial Managed Care - PPO

## 2023-01-29 ENCOUNTER — Emergency Department (HOSPITAL_COMMUNITY)
Admission: EM | Admit: 2023-01-29 | Discharge: 2023-01-29 | Disposition: A | Discharge status: Home / Self Care | Payer: Commercial Managed Care - PPO | Attending: Emergency Medicine | Admitting: Emergency Medicine

## 2023-01-29 ENCOUNTER — Other Ambulatory Visit: Payer: Self-pay

## 2023-01-29 DIAGNOSIS — Z79899 Other long term (current) drug therapy: Secondary | ICD-10-CM | POA: Insufficient documentation

## 2023-01-29 DIAGNOSIS — Z7901 Long term (current) use of anticoagulants: Secondary | ICD-10-CM | POA: Insufficient documentation

## 2023-01-29 DIAGNOSIS — Q382 Macroglossia: Secondary | ICD-10-CM | POA: Insufficient documentation

## 2023-01-29 DIAGNOSIS — I10 Essential (primary) hypertension: Secondary | ICD-10-CM | POA: Diagnosis not present

## 2023-01-29 DIAGNOSIS — K148 Other diseases of tongue: Secondary | ICD-10-CM | POA: Diagnosis present

## 2023-01-29 DIAGNOSIS — J329 Chronic sinusitis, unspecified: Secondary | ICD-10-CM | POA: Diagnosis not present

## 2023-01-29 LAB — BASIC METABOLIC PANEL
Anion gap: 10 (ref 5–15)
BUN: 5 mg/dL — ABNORMAL LOW (ref 6–20)
CO2: 19 mmol/L — ABNORMAL LOW (ref 22–32)
Calcium: 9 mg/dL (ref 8.9–10.3)
Chloride: 104 mmol/L (ref 98–111)
Creatinine, Ser: 0.76 mg/dL (ref 0.61–1.24)
GFR, Estimated: >60 mL/min (ref >60)
Glucose, Bld: 111 mg/dL — ABNORMAL HIGH (ref 70–99)
Potassium: 3.9 mmol/L (ref 3.5–5.1)
Sodium: 133 mmol/L — ABNORMAL LOW (ref 135–145)

## 2023-01-29 LAB — CBC
HCT: 40.4 % (ref 39.0–52.0)
Hemoglobin: 13.6 g/dL (ref 13.0–17.0)
MCH: 33.3 pg (ref 26.0–34.0)
MCHC: 33.7 g/dL (ref 30.0–36.0)
MCV: 98.8 fL (ref 80.0–100.0)
Platelets: 185 10*3/uL (ref 150–400)
RBC: 4.09 MIL/uL — ABNORMAL LOW (ref 4.22–5.81)
RDW: 13.3 % (ref 11.5–15.5)
WBC: 9.8 10*3/uL (ref 4.0–10.5)
nRBC: 0 % (ref 0.0–0.2)

## 2023-01-29 LAB — VITAMIN B12: Vitamin B-12: 256 pg/mL (ref 180–914)

## 2023-01-29 LAB — TSH: TSH: 0.317 u[IU]/mL — ABNORMAL LOW (ref 0.350–4.500)

## 2023-01-29 MED ORDER — DIPHENHYDRAMINE HCL 50 MG/ML IJ SOLN: 25.0000 mg | Freq: Once | INTRAMUSCULAR | Status: DC

## 2023-01-29 MED ORDER — DEXAMETHASONE SODIUM PHOSPHATE 10 MG/ML IJ SOLN
10.0000 mg | Freq: Once | INTRAMUSCULAR | Status: DC
  Filled 2023-01-29: qty 1

## 2023-01-29 MED ORDER — DEXAMETHASONE SODIUM PHOSPHATE 10 MG/ML IJ SOLN
10.0000 mg | Freq: Once | INTRAMUSCULAR | Status: AC
  Administered 2023-01-29: 10 mg via INTRAVENOUS

## 2023-01-29 MED ORDER — IOHEXOL 300 MG/ML  SOLN
80.0000 mL | Freq: Once | INTRAMUSCULAR | Status: AC | PRN
  Administered 2023-01-29: 80 mL via INTRAVENOUS

## 2023-01-29 MED ORDER — DIPHENHYDRAMINE HCL 50 MG/ML IJ SOLN
25.0000 mg | Freq: Once | INTRAMUSCULAR | Status: AC
  Administered 2023-01-29: 25 mg via INTRAVENOUS
  Filled 2023-01-29: qty 1

## 2023-01-29 NOTE — Discharge Instructions (Addendum)
I would recommend you follow-up with your primary care doctor in the clinic this week if possible.  Your initial thyroid blood test suggest that you may have a potential problem with your thyroid.  This may need further workup and potential treatment by your primary care provider.  Your remaining blood test in the ER showed results in the next few days for your office visit.  I recommend that you follow-up with an ear nose and throat specialist regarding your tongue swelling.  There are many possible causes for tongue swelling.  You can continue using Benadryl as needed at home for swelling every 6-8 hours, for the next several days.  If your swelling is getting worse, or you have difficulty swallowing fluids, taking medicines, or swallowing saliva, or you are having difficulty breathing, please call 911 or return to the ER.  Please also note your blood pressure was high in the ER today.  Continue monitoring your blood pressure at home and take your normal medications for high blood pressure.

## 2023-01-29 NOTE — ED Provider Notes (Signed)
[place] EMERGENCY DEPARTMENT AT [facility] Provider Note   CSN: [id number] Arrival date & time: 01/29/23  0827     History  Chief Complaint  Patient presents with   Oral Swelling    Jeffrey [name] is a 57 y.o. male w/ hx of PE on eliquis presenting to ED with tongue swelling for 2 days.  Patient denies of any new medications or known allergies or prior history of tongue swelling.  He denies sore throat, fevers, chills.  He says he has been drinking liquids primarily because he is some difficulty chewing due to his enlarged tongue.  He has not taken any antihistamine medications.  He denies any family history of angioedema that he is aware of.  He denies any ACE inhibitor use  HPI     Home Medications Prior to Admission medications   Medication Sig Start Date End Date Taking? Authorizing Provider  apixaban (ELIQUIS) 5 MG TABS tablet Take 2 tablets twice a day for 4 more days, then 1 tablets twice a day afterwards 01/20/23   Bess Kinds, MD  atorvastatin (LIPITOR) 40 MG tablet Take 1 tablet (40 mg total) by mouth daily. 01/20/23 01/20/24  Bess Kinds, MD  DULoxetine (CYMBALTA) 60 MG capsule Take 1 capsule (60 mg total) by mouth daily. 01/01/23   Ivery Quale, MD  ferrous sulfate 325 (65 FE) MG tablet Take 1 tablet (325 mg total) by mouth every other day for 60 days then as directed by MD 12/25/22 12/25/23  Levin Erp, MD  NON FORMULARY Pt uses a cpap nighlty    [provider]  olmesartan (BENICAR) 40 MG tablet Take 1 tablet (40 mg total) by mouth at bedtime. 08/18/22   Sabino Dick, DO  Tiotropium Bromide Monohydrate (SPIRIVA RESPIMAT) 1.25 MCG/ACT AERS Inhale 2 puffs into the lungs daily. 12/24/22   Lincoln Brigham, MD      Allergies    Patient has no known allergies.    Review of Systems   Review of Systems  Physical Exam Updated Vital Signs BP (!) 165/106   Pulse 96   Temp 98.9 F (37.2 C)   Resp 20   Ht 6\' 4"  (1.93 m)   Wt 112 kg    SpO2 99%   BMI 30.06 kg/m  Physical Exam Constitutional:      General: He is not in acute distress. HENT:     Head: Normocephalic and atraumatic.     Comments: Uniform macroglossia with no tongue or soft tissue ulcerations or lesions, no lip swelling, uvula no uvula swelling, no stridor, trismus Eyes:     Conjunctiva/sclera: Conjunctivae normal.     Pupils: Pupils are equal, round, and reactive to light.  Cardiovascular:     Rate and Rhythm: Normal rate and regular rhythm.  Pulmonary:     Effort: Pulmonary effort is normal. No respiratory distress.  Abdominal:     General: There is no distension.     Tenderness: There is no abdominal tenderness.  Skin:    General: Skin is warm and dry.  Neurological:     General: No focal deficit present.     Mental Status: He is alert. Mental status is at baseline.  Psychiatric:        Mood and Affect: Mood normal.        Behavior: Behavior normal.     ED Results / Procedures / Treatments   Labs (all labs ordered are listed, but only abnormal results are displayed) Labs Reviewed  BASIC  METABOLIC PANEL - Abnormal; Notable for the following components:      Result Value   Sodium 133 (*)    CO2 19 (*)    Glucose, Bld 111 (*)    BUN 5 (*)    All other components within normal limits  CBC - Abnormal; Notable for the following components:   RBC 4.09 (*)    All other components within normal limits  TSH - Abnormal; Notable for the following components:   TSH 0.317 (*)    All other components within normal limits  VITAMIN B12  C1 ESTERASE INHIBITOR  T4, FREE    EKG None  Radiology CT Soft Tissue Neck W Contrast  Result Date: 01/29/2023 CLINICAL DATA:  New onset macroglossia, evaluate for tumor. EXAM: CT NECK WITH CONTRAST TECHNIQUE: Multidetector CT imaging of the neck was performed using the standard protocol following the bolus administration of intravenous contrast. RADIATION DOSE REDUCTION: This exam was performed according  to the departmental dose-optimization program which includes automated exposure control, adjustment of the mA and/or kV according to patient size and/or use of iterative reconstruction technique. CONTRAST:  80mL OMNIPAQUE IOHEXOL 300 MG/ML  SOLN COMPARISON:  None Available. FINDINGS: Pharynx and larynx: No detected mass or edema, internal tongue architecture is preserved when accounting for streak artifact. The oropharynx is patent. Salivary glands: No inflammation, mass, or stone. Thyroid: Normal. Lymph nodes: None enlarged or abnormal density. Vascular: Atheromatous calcification of the cervical carotids. Limited intracranial: Unremarkable Visualized orbits: Negative Mastoids and visualized paranasal sinuses: Patchy opacification of the bilateral paranasal sinuses. Associated sclerotic wall thickening bilaterally. Skeleton: Diffuse spondylitic spurring compatible with diffuse idiopathic skeletal hyperostosis. Upper chest: Negative IMPRESSION: 1. No mass or detectable edema involving the tongue. No acute finding seen throughout the neck. 2. Chronic bilateral sinusitis. Electronically Signed   By: Tiburcio Pea M.D.   On: 01/29/2023 11:14    Procedures Procedures    Medications Ordered in ED Medications  dexamethasone (DECADRON) injection 10 mg (10 mg Intravenous Given 01/29/23 0930)  diphenhydrAMINE (BENADRYL) injection 25 mg (25 mg Intravenous Given 01/29/23 0930)  iohexol (OMNIPAQUE) 300 MG/ML solution 80 mL (80 mLs Intravenous Contrast Given 01/29/23 1019)    ED Course/ Medical Decision Making/ A&P                                 Medical Decision Making Amount and/or Complexity of Data Reviewed Labs: ordered. Radiology: ordered.  Risk Prescription drug management.   Patient presents to the ED with macroglossia for 2 days.  There was no traumatic onset.  No clear evidence of angioedema at this time without lip or uvula swelling, or ACE inhibitor usage, but difficulty exclude this  possibility.  It would be idiopathic if that were the case.  The differential for macroglossia remains quite broad and would include endocrine or thyroid disorder, congenital disorder (less likely at his age), tumors of the oral cavity, amyloidosis, and other medical conditions.  I personally reviewed interpreted patient's labs and imaging, notable for mildly low TSH level with free T4 levels pending.  This may be suggestive of hypothyroidism, which the patient denies any prior history of, but will need close follow-up with his PCP.  While awaiting T4 level I would not initiate Synthroid at this time.  I also would expect hypothyroidism to cause an abrupt onset of macroglossia -typically this is slower and more insidious process.  Likewise for amyloidosis.  Will provide  referral to ENT.  While I don't suspect immediate surgical emergency, he has OSA and uses CPAP and may need further swelling/airway monitoring via the specialist.  He is stable for discharge - return precautions discussed.  High BP noted at discharge as well.  Doubt deep space infection, sepsis, ludwig's angina, abscess or other surgical or infectious emergency  IM decadron given for swelling        Final Clinical Impression(s) / ED Diagnoses Final diagnoses:  Macroglossia  Hypertension, unspecified type    Rx / DC Orders ED Discharge Orders     None         Terald Sleeper, MD 01/29/23 1128

## 2023-01-29 NOTE — ED Triage Notes (Signed)
Patient arrives ambulatory with rolling walker c/o tongue swelling since Wednesday. States he has been taking OTC meds and googling. Patient states he has only had liquid since swelling started.

## 2023-02-03 NOTE — Progress Notes (Deleted)
  SUBJECTIVE:   CHIEF COMPLAINT / HPI:   ED F/u -Seen 11/1 for macroglossia. Noted to have low TSH. CT neck negative for mass  PERTINENT  PMH / PSH: ***  Past Medical History:  Diagnosis Date   Acute pulmonary embolus (HCC)    bilateral submassive PE in setting of missing several doses of Xarelto for his DVT   Arthritis    DVT (deep venous thrombosis) (HCC) 11/28/2013   RT LEG   High cholesterol    Hypertension    Lumbar spine scoliosis    Marijuana use    Sleep apnea    OBJECTIVE:  There were no vitals taken for this visit. Physical Exam   ASSESSMENT/PLAN:   Assessment & Plan  No follow-ups on file. Bess Kinds, MD 02/03/2023, 3:00 PM PGY-***, [facility] {    This will disappear when note is signed, click to select method of visit    :1}

## 2023-02-04 ENCOUNTER — Ambulatory Visit: Payer: Commercial Managed Care - PPO | Admitting: Student

## 2023-02-05 ENCOUNTER — Other Ambulatory Visit (HOSPITAL_COMMUNITY): Payer: Self-pay

## 2023-02-05 ENCOUNTER — Telehealth: Payer: Self-pay

## 2023-02-05 NOTE — Telephone Encounter (Signed)
Patient calls nurse line due to cost issues with Eliquis.   Reports that he was told that cost of prescription would be around $110.00. Patient is unable to afford this.   Called pharmacy to gather more information. Pharmacist reports that patient has used up all of the free trials and manufacturer coupons.   Patient reports that he is down to about two pills.   Will forward to pharmacy team for further assistance.   Veronda Prude, RN

## 2023-02-05 NOTE — Telephone Encounter (Signed)
Patient contacted for follow-up of request for samples of Eliquis (apixaban) 5mg   Patient reports expense of medication is more than he can afford and that his insurance has changed.  He is unable to come to the building prior to our closing, does not have access to transportation and his wife is at work.   Wife works at [employer] and is working until past time of our office closing.   I contacted wife, shared scenario and she was accepting of the offer to deliver her samples for her husband's use on my way out of the office today.  I shared that I would try to deliver them to her at the [facility] Registration desk within the hour.   Medication Samples have been provided to the patient.  Drug name: Eliquis (apixaban)       Strength: 5mg         Qty: 112 tablets (56 day supply)  LOT: [id number], [id number]   Exp.Date: 01/28/2024 , 04/29/2024  Dosing instructions: 1 tab BID  The patient has been instructed regarding the correct time, dose, and frequency of taking this medication, including desired effects and most common side effects.   Madelon Lips 3:16 PM 02/05/2023     Total time with patient call and documentation of interaction: 27 minutes.

## 2023-02-08 NOTE — Telephone Encounter (Signed)
Above and [employer]. Thank you. Reviewed and agree with Dr Macky Lower plan.

## 2023-02-12 ENCOUNTER — Telehealth: Payer: Self-pay

## 2023-02-12 NOTE — Telephone Encounter (Signed)
Transition Care Management Follow-up Telephone Call Date of discharge and from where: Jeffrey [name] 11/1 How have you been since you were released from the hospital? Patient has not had a follow up due to transportation issues. He doesn't have a ride to the PCP. Patient has concerns with cost of his mediation  Any questions or concerns? No  Items Reviewed: Did the pt receive and understand the discharge instructions provided? Yes  Medications obtained and verified? Yes  Other? No  Any new allergies since your discharge? No  Dietary orders reviewed? No Do you have support at home? Yes    Follow up appointments reviewed:  PCP Hospital f/u appt confirmed? No  Scheduled to see  on  @ . Specialist Hospital f/u appt confirmed? No  Scheduled to see  on  @ . Are transportation arrangements needed? No  If their condition worsens, is the pt aware to call PCP or go to the Emergency Dept.? Yes Was the patient provided with contact information for the PCP's office or ED? Yes Was to pt encouraged to call back with questions or concerns? Yes

## 2023-02-23 ENCOUNTER — Telehealth: Payer: Commercial Managed Care - PPO | Admitting: Student

## 2023-02-23 ENCOUNTER — Other Ambulatory Visit (HOSPITAL_COMMUNITY): Payer: Self-pay

## 2023-02-23 ENCOUNTER — Other Ambulatory Visit (HOSPITAL_BASED_OUTPATIENT_CLINIC_OR_DEPARTMENT_OTHER): Payer: Self-pay

## 2023-02-23 ENCOUNTER — Telehealth: Payer: Self-pay

## 2023-02-23 DIAGNOSIS — Z5982 Transportation insecurity: Secondary | ICD-10-CM

## 2023-02-23 NOTE — Patient Instructions (Addendum)
It was great to see you! Thank you for allowing me to participate in your care!  Our Plans today - I will message the Pharmacy about your medications for assistance - I will place a referral to get you connected with our transportation services.   Take care and seek immediate care sooner if you develop any concerns.   Dr. Bess Kinds, MD [facility]

## 2023-02-23 NOTE — Telephone Encounter (Signed)
Patient calls nurse line requesting to speak with PCP.   He reports he has a few things he would like to talk with PCP about. He reports he has no transportation and is unable to come into the office for an apt.   Patient agreed to a virtual apt this afternoon with PCP.   Referral can be placed for transportation needs at that time for our care management team.

## 2023-02-23 NOTE — Progress Notes (Signed)
Virtual Visit via Phone Note  I connected with Jeffrey [name] on 02/23/23 at  2:10 PM EST by a phone enabled telemedicine application and verified that I am speaking with the correct person using two identifiers.  Location: Patient: Jeffrey [name] Provider: Dr. Barbaraann Faster   I discussed the limitations of evaluation and management by telemedicine and the availability of in person appointments. The patient expressed understanding and agreed to proceed.  History of Present Illness: Patient has transportation issues, no access to car. Is attempting to get one car fixed. Is trying to share mother in law's car, but unable to use often enough. Is unable to make it to appointments because of this. Patient has issues with walking and is unable to take public transportation. Patient also having issues with paying for Eliquis. Has free samples now, but at start of new year, he is afraid that he won't be able to afford this medication. He is hoping for continued assistance through pharmacy team.    Assessment and Plan: Transportation Issues  Patient unable to make it to appointments and get medical care he needs 2/2 to not having a vehicle in the home, that he can access. Pt is needing to get to medical appointments but unable to make them. Pt has walking difficulty and cannot take public transportation.   -Ref to Care Management for assistance with transportation   Med Cost Issue -Message Pharmacy about continued med assistance/plan for eliquis going forward.   Follow Up Instructions: As needed   I discussed the assessment and treatment plan with the patient. The patient was provided an opportunity to ask questions and all were answered. The patient agreed with the plan and demonstrated an understanding of the instructions.   The patient was advised to call back or seek an in-person evaluation if the symptoms worsen or if the condition fails to improve as anticipated.  I provided 10 minutes of  non-face-to-face time during this encounter.   Bess Kinds, MD

## 2023-02-24 ENCOUNTER — Other Ambulatory Visit (HOSPITAL_COMMUNITY): Payer: Self-pay

## 2023-02-24 ENCOUNTER — Other Ambulatory Visit: Payer: Self-pay | Admitting: Student

## 2023-02-24 MED ORDER — FERROUS SULFATE 325 (65 FE) MG PO TABS
325.0000 mg | ORAL_TABLET | ORAL | 3 refills | Status: AC
  Filled 2023-02-24 – 2023-03-05 (×2): qty 30, 60d supply, fill #0

## 2023-03-03 DIAGNOSIS — E785 Hyperlipidemia, unspecified: Secondary | ICD-10-CM | POA: Diagnosis not present

## 2023-03-03 DIAGNOSIS — Z7901 Long term (current) use of anticoagulants: Secondary | ICD-10-CM | POA: Diagnosis not present

## 2023-03-03 DIAGNOSIS — Z86718 Personal history of other venous thrombosis and embolism: Secondary | ICD-10-CM | POA: Diagnosis not present

## 2023-03-03 DIAGNOSIS — I10 Essential (primary) hypertension: Secondary | ICD-10-CM | POA: Diagnosis not present

## 2023-03-03 DIAGNOSIS — Z720 Tobacco use: Secondary | ICD-10-CM | POA: Diagnosis not present

## 2023-03-03 DIAGNOSIS — G8929 Other chronic pain: Secondary | ICD-10-CM | POA: Diagnosis not present

## 2023-03-03 DIAGNOSIS — D509 Iron deficiency anemia, unspecified: Secondary | ICD-10-CM | POA: Diagnosis not present

## 2023-03-03 DIAGNOSIS — Z5982 Transportation insecurity: Secondary | ICD-10-CM | POA: Diagnosis not present

## 2023-03-04 ENCOUNTER — Telehealth: Payer: Self-pay | Admitting: *Deleted

## 2023-03-04 DIAGNOSIS — Z5982 Transportation insecurity: Secondary | ICD-10-CM | POA: Insufficient documentation

## 2023-03-04 NOTE — Assessment & Plan Note (Signed)
Patient needs assistance getting to appointments. His family only has one car and uses it often/cannot assist with getting him to appointments. Patient is missing appointment's/unable to get care he needs. Patient has issues with walking and is unable to take public transportation. Will place care management referral for transportation needs. -Care Manage Ref, SDOH for Transportation

## 2023-03-04 NOTE — Progress Notes (Signed)
  Care Coordination   Note   03/04/2023 Name: GEOVONNI TIONGCO MRN: [id number] DOB: 1966/01/24  FLAY RIBERA is a 57 y.o. year old male who sees Bess Kinds, MD for primary care. I reached out to Alessandra Bevels by phone today to offer care coordination services.  Mr. Martie Round was given information about Care Coordination services today including:   The Care Coordination services include support from the care team which includes your Nurse Coordinator, Clinical Social Worker, or Pharmacist.  The Care Coordination team is here to help remove barriers to the health concerns and goals most important to you. Care Coordination services are voluntary, and the patient may decline or stop services at any time by request to their care team member.   Care Coordination Consent Status: Patient agreed to services and verbal consent obtained.   Follow up plan:  Telephone appointment with care coordination team member scheduled for:  03/05/23  Encounter Outcome:  Patient Scheduled   [facility] Coordination Care Guide  Direct Dial: [phone]

## 2023-03-05 ENCOUNTER — Other Ambulatory Visit (HOSPITAL_COMMUNITY): Payer: Self-pay

## 2023-03-05 ENCOUNTER — Ambulatory Visit: Payer: Self-pay | Admitting: Licensed Clinical Social Worker

## 2023-03-05 NOTE — Patient Outreach (Signed)
  Care Coordination   03/05/2023 Name: Jeffrey [name] MRN: [id number] DOB: [birth year]   Care Coordination Outreach Attempts:  An unsuccessful telephone outreach was attempted for a scheduled appointment today.  Follow Up Plan:  No further outreach attempts will be made at this time. We have been unable to contact the patient to offer or enroll patient in care coordination services  Encounter Outcome:  No Answer   Care Coordination Interventions:  No, not indicated     Gwyndolyn Saxon MSW, LCSW Licensed Clinical Social Worker  [facility], Population Health Direct Dial: [phone]  Fax: [phone]

## 2023-04-13 ENCOUNTER — Other Ambulatory Visit (HOSPITAL_COMMUNITY): Payer: Self-pay

## 2023-04-13 ENCOUNTER — Telehealth: Payer: Self-pay

## 2023-04-13 NOTE — Telephone Encounter (Signed)
 Patient contacted for follow-up of request for Eliquis  samples. Patient states he has about 5 day supply remaining.   Since last contact patient reports he has financial difficulty and unable afford at this time.  We discussed applying for the Eliquis  co-pay card. He is willing to apply.   Written information provided for co-pay card.  Medication Samples have been provided to the patient.  Drug name: Eliquis  (apixaban ) 5mg           Qty: 28 tablets  LOT: [id number]  Exp.Date: 04/29/2024  Dosing instructions: 1 tablets BID  The patient has been instructed regarding the correct time, dose, and frequency of taking this medication, including desired effects and most common side effects.   Jeffrey [name] 4:55 PM 04/13/2023    Total time with patient call and documentation of interaction: 17 minutes.

## 2023-04-13 NOTE — Telephone Encounter (Signed)
 Patient calls nurse line requesting to speak with Koval.   He reports he is in need of Eliquis samples again.   He reports he takes 5mg  BID.  Advised will forward to pharmacy team.

## 2023-04-14 NOTE — Telephone Encounter (Signed)
 Reviewed and agree with Dr Macky Lower plan.

## 2023-04-16 NOTE — Telephone Encounter (Signed)
Patient calls nurse line requesting to speak with Koval.  He reports challenges in being able to come by the office to pick up samples.  He reports him and his wife only have one car and she uses it for work. He reports she is unable to pick up samples for him due to work hours. He reports she does not get off until 5 pm, however we will be closed.  He reports [name] did them a favor once and he would like to discuss.  Advised will forward to [facility].

## 2023-04-19 NOTE — Telephone Encounter (Signed)
Patient contacts office and requests sample of Eliquis (apixaban) due to cost and inability to pay/(afford) medication.  States he has transportation issues (wife has car and is at work until 5:00 PM).   He reports adequate supply until Monday AM.   Plan created to supply medication to patient - I will meet wife at her [employer] job site and deliver supply.  I asked patient to contact wife and make her aware of the plan to deliver medication prior to 5:00 PM.   Medication Samples have been provided to the patient's spouse at [facility]   28 tables delivered at 4:50 PM at end of business day.    Total time with patient call, medication delivery and documentation of interaction: 27 minutes.

## 2023-04-19 NOTE — Telephone Encounter (Signed)
Reviewed and agree with Dr Koval's plan.   

## 2023-04-26 ENCOUNTER — Other Ambulatory Visit (HOSPITAL_COMMUNITY): Payer: Self-pay

## 2023-05-17 ENCOUNTER — Other Ambulatory Visit (HOSPITAL_COMMUNITY): Payer: Self-pay

## 2023-05-17 ENCOUNTER — Other Ambulatory Visit: Payer: Self-pay

## 2023-05-20 ENCOUNTER — Other Ambulatory Visit (HOSPITAL_COMMUNITY): Payer: Self-pay

## 2023-07-27 ENCOUNTER — Other Ambulatory Visit (HOSPITAL_COMMUNITY): Payer: Self-pay

## 2023-09-07 ENCOUNTER — Encounter: Payer: Self-pay | Admitting: *Deleted

## 2023-09-17 ENCOUNTER — Other Ambulatory Visit (HOSPITAL_COMMUNITY): Payer: Self-pay

## 2023-09-20 ENCOUNTER — Other Ambulatory Visit (HOSPITAL_COMMUNITY): Payer: Self-pay

## 2023-11-11 ENCOUNTER — Other Ambulatory Visit (HOSPITAL_COMMUNITY): Payer: Self-pay

## 2023-11-15 ENCOUNTER — Other Ambulatory Visit (HOSPITAL_COMMUNITY): Payer: Self-pay

## 2024-01-04 ENCOUNTER — Encounter (HOSPITAL_COMMUNITY): Payer: Self-pay | Admitting: Internal Medicine

## 2024-01-12 ENCOUNTER — Encounter (HOSPITAL_COMMUNITY): Payer: Self-pay

## 2024-01-12 ENCOUNTER — Ambulatory Visit (HOSPITAL_COMMUNITY)
Admission: RE | Admit: 2024-01-12 | Discharge: 2024-01-12 | Disposition: A | Discharge status: Home / Self Care | Source: Ambulatory Visit | Attending: Internal Medicine | Admitting: Internal Medicine

## 2024-01-12 DIAGNOSIS — J439 Emphysema, unspecified: Secondary | ICD-10-CM | POA: Diagnosis not present

## 2024-01-12 DIAGNOSIS — Z720 Tobacco use: Secondary | ICD-10-CM | POA: Diagnosis not present

## 2024-01-12 DIAGNOSIS — Z87891 Personal history of nicotine dependence: Secondary | ICD-10-CM | POA: Diagnosis not present

## 2024-01-12 DIAGNOSIS — Z86711 Personal history of pulmonary embolism: Secondary | ICD-10-CM | POA: Diagnosis not present

## 2024-01-25 ENCOUNTER — Ambulatory Visit: Payer: Self-pay | Admitting: Internal Medicine

## 2024-01-25 NOTE — Progress Notes (Signed)
 Patient is overdue for a follow-up. Please call and schedule an appointment to discuss results. Also, see Oct 2024 notes. Was supposed to have other tests befoe return.

## 2024-02-14 ENCOUNTER — Other Ambulatory Visit: Payer: Self-pay | Admitting: Family Medicine

## 2024-02-14 ENCOUNTER — Other Ambulatory Visit (HOSPITAL_COMMUNITY): Payer: Self-pay

## 2024-02-14 DIAGNOSIS — I10 Essential (primary) hypertension: Secondary | ICD-10-CM

## 2024-02-15 ENCOUNTER — Other Ambulatory Visit (HOSPITAL_COMMUNITY): Payer: Self-pay

## 2024-02-15 MED ORDER — OLMESARTAN MEDOXOMIL 40 MG PO TABS
40.0000 mg | ORAL_TABLET | Freq: Every day | ORAL | 0 refills | Status: AC
  Filled 2024-02-15: qty 90, 90d supply, fill #0

## 2024-02-15 MED ORDER — APIXABAN 5 MG PO TABS
5.0000 mg | ORAL_TABLET | Freq: Two times a day (BID) | ORAL | 4 refills | Status: AC
  Filled 2024-02-15: qty 120, 60d supply, fill #0

## 2024-02-15 MED ORDER — ATORVASTATIN CALCIUM 40 MG PO TABS
40.0000 mg | ORAL_TABLET | Freq: Every day | ORAL | 2 refills | Status: AC
  Filled 2024-02-15: qty 90, 90d supply, fill #0

## 2024-02-15 NOTE — Telephone Encounter (Signed)
 Chart reviewed. Rx refilled. Requesting patient to be seen before next refill.

## 2024-02-15 NOTE — Telephone Encounter (Signed)
 Chart reviewed. Requesting patient to be seen before next refill. Rx refilled.
